# Patient Record
Sex: Male | Born: 1975
Health system: Southern US, Community
[De-identification: ages and names within clinical notes are randomized; demographics above are authoritative.]

## PROBLEM LIST (undated history)

## (undated) DIAGNOSIS — L732 Hidradenitis suppurativa: Secondary | ICD-10-CM

## (undated) DIAGNOSIS — T7840XA Allergy, unspecified, initial encounter: Secondary | ICD-10-CM

## (undated) DIAGNOSIS — Z87442 Personal history of urinary calculi: Secondary | ICD-10-CM

## (undated) DIAGNOSIS — L0291 Cutaneous abscess, unspecified: Secondary | ICD-10-CM

## (undated) DIAGNOSIS — N200 Calculus of kidney: Secondary | ICD-10-CM

## (undated) DIAGNOSIS — E119 Type 2 diabetes mellitus without complications: Secondary | ICD-10-CM

## (undated) DIAGNOSIS — F32A Depression, unspecified: Secondary | ICD-10-CM

## (undated) DIAGNOSIS — R42 Dizziness and giddiness: Secondary | ICD-10-CM

## (undated) DIAGNOSIS — K219 Gastro-esophageal reflux disease without esophagitis: Secondary | ICD-10-CM

## (undated) DIAGNOSIS — E785 Hyperlipidemia, unspecified: Secondary | ICD-10-CM

## (undated) DIAGNOSIS — I10 Essential (primary) hypertension: Secondary | ICD-10-CM

## (undated) DIAGNOSIS — F419 Anxiety disorder, unspecified: Secondary | ICD-10-CM

## (undated) HISTORY — DX: Depression, unspecified: F32.A

## (undated) HISTORY — DX: Calculus of kidney: N20.0

## (undated) HISTORY — DX: Type 2 diabetes mellitus without complications: E11.9

## (undated) HISTORY — PX: INCISION AND DRAINAGE DEEP NECK ABSCESS: SHX1797

## (undated) HISTORY — DX: Gastro-esophageal reflux disease without esophagitis: K21.9

## (undated) HISTORY — DX: Hyperlipidemia, unspecified: E78.5

## (undated) HISTORY — DX: Allergy, unspecified, initial encounter: T78.40XA

---
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
## 1998-06-14 ENCOUNTER — Encounter: Payer: Self-pay | Admitting: Emergency Medicine

## 1998-06-14 ENCOUNTER — Emergency Department (HOSPITAL_COMMUNITY): Admission: EM | Admit: 1998-06-14 | Discharge: 1998-06-14 | Payer: Self-pay | Admitting: Emergency Medicine

## 1999-11-18 ENCOUNTER — Emergency Department (HOSPITAL_COMMUNITY): Admission: EM | Admit: 1999-11-18 | Discharge: 1999-11-18 | Payer: Self-pay | Admitting: Emergency Medicine

## 2000-10-05 ENCOUNTER — Emergency Department (HOSPITAL_COMMUNITY): Admission: EM | Admit: 2000-10-05 | Discharge: 2000-10-05 | Payer: Self-pay | Admitting: Emergency Medicine

## 2000-10-06 ENCOUNTER — Encounter: Payer: Self-pay | Admitting: Otolaryngology

## 2000-10-06 ENCOUNTER — Inpatient Hospital Stay (HOSPITAL_COMMUNITY): Admission: RE | Admit: 2000-10-06 | Discharge: 2000-10-09 | Payer: Self-pay | Admitting: Otolaryngology

## 2003-07-09 ENCOUNTER — Emergency Department (HOSPITAL_COMMUNITY): Admission: EM | Admit: 2003-07-09 | Discharge: 2003-07-09 | Payer: Self-pay | Admitting: Emergency Medicine

## 2004-12-17 ENCOUNTER — Inpatient Hospital Stay (HOSPITAL_COMMUNITY): Admission: EM | Admit: 2004-12-17 | Discharge: 2004-12-19 | Payer: Self-pay | Admitting: Emergency Medicine

## 2005-01-09 ENCOUNTER — Emergency Department (HOSPITAL_COMMUNITY): Admission: EM | Admit: 2005-01-09 | Discharge: 2005-01-09 | Payer: Self-pay | Admitting: Emergency Medicine

## 2005-10-23 ENCOUNTER — Emergency Department (HOSPITAL_COMMUNITY): Admission: EM | Admit: 2005-10-23 | Discharge: 2005-10-23 | Payer: Self-pay | Admitting: Emergency Medicine

## 2005-12-30 ENCOUNTER — Emergency Department (HOSPITAL_COMMUNITY): Admission: EM | Admit: 2005-12-30 | Discharge: 2005-12-30 | Payer: Self-pay | Admitting: Emergency Medicine

## 2006-01-05 ENCOUNTER — Emergency Department (HOSPITAL_COMMUNITY): Admission: EM | Admit: 2006-01-05 | Discharge: 2006-01-05 | Payer: Self-pay | Admitting: Emergency Medicine

## 2006-01-28 ENCOUNTER — Inpatient Hospital Stay (HOSPITAL_COMMUNITY): Admission: EM | Admit: 2006-01-28 | Discharge: 2006-01-29 | Payer: Self-pay | Admitting: Emergency Medicine

## 2006-02-02 ENCOUNTER — Ambulatory Visit: Payer: Self-pay | Admitting: *Deleted

## 2006-03-04 ENCOUNTER — Emergency Department (HOSPITAL_COMMUNITY): Admission: EM | Admit: 2006-03-04 | Discharge: 2006-03-04 | Payer: Self-pay | Admitting: Emergency Medicine

## 2006-12-24 ENCOUNTER — Emergency Department (HOSPITAL_COMMUNITY): Admission: EM | Admit: 2006-12-24 | Discharge: 2006-12-24 | Payer: Self-pay | Admitting: Emergency Medicine

## 2007-09-28 ENCOUNTER — Encounter (INDEPENDENT_AMBULATORY_CARE_PROVIDER_SITE_OTHER): Payer: Self-pay | Admitting: *Deleted

## 2007-09-28 ENCOUNTER — Inpatient Hospital Stay (HOSPITAL_COMMUNITY): Admission: EM | Admit: 2007-09-28 | Discharge: 2007-09-30 | Payer: Self-pay | Admitting: Emergency Medicine

## 2007-09-28 ENCOUNTER — Ambulatory Visit: Payer: Self-pay | Admitting: Cardiology

## 2008-05-04 ENCOUNTER — Emergency Department (HOSPITAL_COMMUNITY): Admission: EM | Admit: 2008-05-04 | Discharge: 2008-05-04 | Payer: Self-pay | Admitting: Emergency Medicine

## 2008-11-16 ENCOUNTER — Emergency Department (HOSPITAL_COMMUNITY): Admission: EM | Admit: 2008-11-16 | Discharge: 2008-11-16 | Payer: Self-pay | Admitting: Emergency Medicine

## 2009-05-24 ENCOUNTER — Emergency Department (HOSPITAL_COMMUNITY): Admission: EM | Admit: 2009-05-24 | Discharge: 2009-05-24 | Payer: Self-pay | Admitting: Emergency Medicine

## 2009-09-28 ENCOUNTER — Emergency Department (HOSPITAL_COMMUNITY): Admission: EM | Admit: 2009-09-28 | Discharge: 2009-09-28 | Payer: Self-pay | Admitting: Emergency Medicine

## 2009-10-23 ENCOUNTER — Emergency Department (HOSPITAL_COMMUNITY): Admission: EM | Admit: 2009-10-23 | Discharge: 2009-10-23 | Payer: Self-pay | Admitting: Emergency Medicine

## 2010-07-12 ENCOUNTER — Emergency Department (HOSPITAL_COMMUNITY)
Admission: EM | Admit: 2010-07-12 | Discharge: 2010-07-12 | Disposition: A | Discharge status: Home / Self Care | Payer: Self-pay | Attending: Emergency Medicine | Admitting: Emergency Medicine

## 2010-07-12 DIAGNOSIS — M542 Cervicalgia: Secondary | ICD-10-CM | POA: Insufficient documentation

## 2010-07-12 DIAGNOSIS — L03221 Cellulitis of neck: Secondary | ICD-10-CM | POA: Insufficient documentation

## 2010-07-12 DIAGNOSIS — R220 Localized swelling, mass and lump, head: Secondary | ICD-10-CM | POA: Insufficient documentation

## 2010-07-12 DIAGNOSIS — L0211 Cutaneous abscess of neck: Secondary | ICD-10-CM | POA: Insufficient documentation

## 2010-07-13 ENCOUNTER — Inpatient Hospital Stay (HOSPITAL_COMMUNITY)
Admission: EM | Admit: 2010-07-13 | Discharge: 2010-07-16 | DRG: 607 | Disposition: A | Discharge status: Home / Self Care | Payer: Self-pay | Attending: Surgery | Admitting: Surgery

## 2010-07-13 DIAGNOSIS — Z6841 Body Mass Index (BMI) 40.0 and over, adult: Secondary | ICD-10-CM

## 2010-07-13 DIAGNOSIS — I10 Essential (primary) hypertension: Secondary | ICD-10-CM | POA: Diagnosis present

## 2010-07-13 DIAGNOSIS — F17200 Nicotine dependence, unspecified, uncomplicated: Secondary | ICD-10-CM | POA: Diagnosis present

## 2010-07-13 DIAGNOSIS — F1210 Cannabis abuse, uncomplicated: Secondary | ICD-10-CM | POA: Diagnosis present

## 2010-07-13 DIAGNOSIS — L732 Hidradenitis suppurativa: Principal | ICD-10-CM | POA: Diagnosis present

## 2010-07-13 DIAGNOSIS — E785 Hyperlipidemia, unspecified: Secondary | ICD-10-CM | POA: Diagnosis present

## 2010-07-13 DIAGNOSIS — I441 Atrioventricular block, second degree: Secondary | ICD-10-CM | POA: Diagnosis present

## 2010-07-13 DIAGNOSIS — F1411 Cocaine abuse, in remission: Secondary | ICD-10-CM | POA: Diagnosis present

## 2010-07-13 LAB — POCT I-STAT, CHEM 8
Calcium, Ion: 1.11 mmol/L — ABNORMAL LOW (ref 1.12–1.32)
HCT: 44 % (ref 39.0–52.0)
Hemoglobin: 15 g/dL (ref 13.0–17.0)
TCO2: 23 mmol/L (ref 0–100)

## 2010-07-13 LAB — URINALYSIS, ROUTINE W REFLEX MICROSCOPIC
Ketones, ur: NEGATIVE mg/dL
Protein, ur: NEGATIVE mg/dL
Urine Glucose, Fasting: NEGATIVE mg/dL
Urobilinogen, UA: 0.2 mg/dL (ref 0.0–1.0)

## 2010-07-13 LAB — CBC
MCH: 29.8 pg (ref 26.0–34.0)
MCHC: 34.6 g/dL (ref 30.0–36.0)
MCV: 86.2 fL (ref 78.0–100.0)
Platelets: 188 10*3/uL (ref 150–400)
RDW: 12.6 % (ref 11.5–15.5)

## 2010-07-13 LAB — DIFFERENTIAL
Basophils Relative: 0 % (ref 0–1)
Eosinophils Absolute: 0.3 10*3/uL (ref 0.0–0.7)
Eosinophils Relative: 4 % (ref 0–5)
Lymphs Abs: 2 10*3/uL (ref 0.7–4.0)
Monocytes Relative: 9 % (ref 3–12)

## 2010-07-14 LAB — CBC
HCT: 40.4 % (ref 39.0–52.0)
MCV: 86.9 fL (ref 78.0–100.0)
RBC: 4.65 MIL/uL (ref 4.22–5.81)
WBC: 6.1 10*3/uL (ref 4.0–10.5)

## 2010-07-14 LAB — COMPREHENSIVE METABOLIC PANEL
ALT: 20 U/L (ref 0–53)
BUN: 7 mg/dL (ref 6–23)
Calcium: 8.7 mg/dL (ref 8.4–10.5)
Glucose, Bld: 111 mg/dL — ABNORMAL HIGH (ref 70–99)
Sodium: 139 mEq/L (ref 135–145)
Total Protein: 5.9 g/dL — ABNORMAL LOW (ref 6.0–8.3)

## 2010-07-19 LAB — WOUND CULTURE: Gram Stain: NONE SEEN

## 2010-07-19 NOTE — Discharge Summary (Signed)
  Albert White, Albert White              ACCOUNT NO.:  [id number]  MEDICAL RECORD NO.:  [id number]           PATIENT TYPE:  I  LOCATION:  5126                         FACILITY:  [facility]  PHYSICIAN:  Angelia Mould. Derrell Lolling, M.D.DATE OF BIRTH:  1975-12-12  DATE OF ADMISSION:  07/13/2010 DATE OF DISCHARGE:  07/16/2010                              DISCHARGE SUMMARY   HISTORY OF PRESENT ILLNESS:  [name] is a 35 year old obese gentleman who has a history of abscesses on the back of his neck.  He has been taking doxycycline and Keflex, but presented to the emergency department with complaint of increased drainage and pain.  He has had maybe upwards of 20 incision and drainages of these neck abscesses. Decision was made after being evaluated by Dr. Magnus Ivan that the patient needed to be admitted for IV antibiotics and possible repeat incision and drainage.  SUMMARY OF HOSPITAL COURSE:  The patient was admitted on February 25, started on IV antibiotics.  However, he did show dramatic improvement in the first 24 hours on antibiotics with marked decrease in the amount of pain, redness, and drainage.  Therefore, the decision was made that operative intervention was not necessary.  The patient was switched to p.o. antibiotics where he maintained that for an additional 24 hours. Upon evaluation today, the patient's drainage has ceased, the erythema and tenderness have essentially gone, and the patient feels ready for discharge.  DISCHARGE DIAGNOSES: 1. Chronic posterior neck hidradenitis. 2. Morbid obesity.  DISCHARGE MEDS:  The patient will resume his doxycycline 100 mg twice daily.  Instead of Keflex, he will be placed on Cipro 500 mg twice daily for 10 days.  He is given a refill of his Percocet 1 to 2 tablets q.6 h. p.r.n. pain.  He is given hygiene instructions regarding showering on a daily basis using antimicrobial soap at least 3 times a week, keeping this area dry for moisture,  particularly sweat.  He can follow up back in our office in a couple of weeks for ongoing management.  The patient ultimately may need referral to Plastics if excision is thought necessary.     Brayton El, PA-C   ______________________________ Angelia Mould. Derrell Lolling, M.D.    KB/MEDQ  D:  07/16/2010  T:  07/16/2010  Job:  [id number]  Electronically Signed by Brayton El  on 07/17/2010 02:23:11 PM Electronically Signed by Claud Kelp M.D. on 07/19/2010 09:34:54 AM
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
## 2010-08-03 LAB — URINE MICROSCOPIC-ADD ON

## 2010-08-03 LAB — URINALYSIS, ROUTINE W REFLEX MICROSCOPIC
Glucose, UA: 250 mg/dL — AB
Specific Gravity, Urine: 1.027 (ref 1.005–1.030)
pH: 7 (ref 5.0–8.0)

## 2010-08-03 LAB — GC/CHLAMYDIA PROBE AMP, GENITAL: GC Probe Amp, Genital: NEGATIVE

## 2010-10-01 NOTE — Consult Note (Signed)
Albert White, Albert White              ACCOUNT NO.:  [id number]   MEDICAL RECORD NO.:  [id number]          PATIENT TYPE:  INP   LOCATION:  2010                         FACILITY:  [facility]   PHYSICIAN:  Maisie Fus C. Wall, MD, FACCDATE OF BIRTH:  1975-07-09   DATE OF CONSULTATION:  09/28/2007  DATE OF DISCHARGE:                                 CONSULTATION   PRIMARY CARE PHYSICIAN:  The patient does not have primary care  physician.  He has moved to __________ .   HISTORY OF PRESENT ILLNESS:  Albert White is a 35 year old white male who  presented to [facility] yesterday evening with chest  discomfort.  He stated that around 6 p.m. while growing hamburgers, he  gradually developed anterior left-sided discomfort, which he described  as a pressure, sharp, pounding discomfort that was lasting second at a  time, but it was reoccurring continuously for 3 hours.  He gave it a 10  on a scale of 0-10.  However, after 15 minutes of this discomfort, he  had his friend drive him to the [facility] Emergency Room.  He noted  associated shortness of breath with diaphoresis with this discomfort.  He states that the discomfort was reduced.  He was receiving sublingual  nitroglycerin in the ER.  He states that it is pleuritic in nature and  he feels safe if the chest is tender to touch.  He states that he still  has the discomfort, but it has had a less of an intensity.  He states it  is a 5 or a 6, the last time it was 0 was before the onset.  He denies  prior gas, waterbrash, previous occurrence, or any recent accidents,  injuries, or travel.   PAST MEDICAL HISTORY:  He has an intolerance to CODEINE, for which he  develops nausea.  He is not on any prescription medications.   PAST SURGICAL HISTORY:  Notable for a removal of a neck abscess.  He  specifically denies any hypertension, diabetes, myocardial infarction,  CVA, COPD, thyroid dysfunction, renal disorder, or bleeding disorder.  He did  not know his cholesterol.   SOCIAL HISTORY:  He resides in [place] alone in his trailer.  He has  no children.  He works in [employer].  He smokes one pack per day for  14 years.  Drinks 6 pack a month.  The last time he used cocaine was  approximately 2 weeks ago, but he uses pot on a daily basis.  He does  not exercise, does not follow a specific diet.  His mother died in her  65s secondary to a gunshot wound and had a history of arthritis.  His  father died at the age of 71 in a MVA.  He has one brother and 2  sisters.  Two of his siblings have thyroid problems.  He is not sure of  the specifics.   REVIEW OF SYSTEMS:  In addition to the above is notable for headaches,  sinus problems, chronic dyspnea on exertion, which has not changed  recently, problems with snoring that has never been  evaluated, and sleep  apnea.  Urinary urgency and frequency, increased stress related to  finances, GERD symptoms that are different from the above.  All other  systems are unremarkable.   PHYSICAL EXAMINATION:  GENERAL:  Well-nourished, well-developed, obese  white male in no apparent distress.  VITAL SIGNS:  Temperature is 96.8, blood pressure 142/79, pulse 68, and  respirations 20.  The patient's weight is 152.2 kilograms and 95% sat on  2 liters.  Telemetry shows a normal sinus rhythm.  At approximately 5:47  this morning, he did have second-degree AV block Wenckebach that was  asymptomatic.  HEENT:  Unremarkable.  NECK:  Supple without thyromegaly, adenopathy, JVD, or carotid bruits.  CHEST:  Symmetrical with excursion.  Lung sounds were diminished, but  clear to auscultation without rales, rhonchi, or wheezing.  HEART:  PMI is nondisplaced.  Regular rate and rhythm.  Normal S1 and  S2.  I do not appreciate any murmurs, rubs, clicks, or gallops.  Pulses  are symmetrical and intact.  I do not appreciate any abdominal bruits.  SKIN:  Integument is intact.  He has multiple tattoos in his  upper  extremities.  ABDOMEN:  Obese.  Bowel sounds present without organomegaly, masses, or  tenderness.  EXTREMITIES:  No cyanosis, clubbing, or edema.  MUSCULOSKELETAL:  Unremarkable, though with palpation, his chest  discomfort is intensified and while using his upper extremities in a  pushing motion, it also reproduces this discomfort.  NEUROLOGIC:  Unremarkable.   DIAGNOSTIC DATA:  Chest x-ray showed mild cardiomegaly, vascular  congestion, question right medial basilar air space opacity.  Several  EKGs have showed normal sinus rhythm, baseline artefact, normal  intervals, borderline first-degree block on some, insignificant inferior  Q waves, otherwise unremarkable.   Admission H&H was 16.7 and 46.8.  Normal indices.  Platelets 205, WBC  10.1.  Sodium 135, potassium 4.0, BUN 9, creatinine 0.98, glucose 126, D-  dimer 0.24, BNP less than 30.  CK-MBs, relative index and troponins have  been within normal limits.  Urine drug screen has been positive for  benzos, opioids, THC, and cocaine.   IMPRESSION:  1. Prolonged atypical chest discomfort.  Enzymes and EKGs have ruled      out acute cardiac event.  The last time his discomfort was a 0 was      yesterday.  2. Second-degree asymptomatic atrioventricular block (Wenckebach).  3. Hypertension.  On admission, his blood pressure was 156/98.  4. Hyperlipidemia with a total cholesterol of 170, triglycerides 97,      HDL low at 25, LDL elevated at 126.  5. Tobacco and drug use history noted as above.   DISPOSITION:  Dr. Daleen Squibb reviewed the patient history, spoke with and  examined the patient and agrees with the above.  An echocardiogram is  being performed at the time of this dictation, we will review.  Given  the atypical nature of his chest discomfort and multiple risk factors,  we will have a stress Myoview performed tomorrow morning for further  evaluation.  Given frequent cocaine use and second-degree arteriovenous  block,  would avoid beta-blockers.  I have counseled the patient in  regards to his drug use and tobacco cessation and obtaining a primary  care physician.      Joellyn Rued, PA-C      Jesse Sans. Daleen Squibb, MD, [facility]  Electronically Signed    EW/MEDQ  D:  09/28/2007  T:  09/29/2007  Job:  [id number]

## 2010-10-01 NOTE — H&P (Signed)
NAMECHURCHILL, Albert White NO.:  [id number]   MEDICAL RECORD NO.:  [id number]          PATIENT TYPE:  EMS   LOCATION:  [facility]                         FACILITY:  [facility]   PHYSICIAN:  Michaelyn Barter, M.D. DATE OF BIRTH:  11-23-1975   DATE OF ADMISSION:  09/27/2007  DATE OF DISCHARGE:                              HISTORY & PHYSICAL   PRIMARY MEDICAL DOCTOR:  Unassigned.   CHIEF COMPLAINTS:  Chest pain.   HISTORY OF PRESENT ILLNESS:  Albert White is a 35 year old gentleman who  indicates that at approximately 6 p.m., shortly after grilling He began  to develop chest pain.  He states that the chest pain was centrally  located with radiation to the left side of his chest.  It felt like  someone was sitting on his chest.  The pain lasted for a couple of  hours.  He developed some mild shortness of breath.  He has never had  similar pain.  No nausea, vomiting or fevers.  There is no radiation of  pain down his neck or arm.  He did become slightly diaphoretic.  No  aggravating or relieving factors.   PAST MEDICAL HISTORY:  1. Boil on the neck.  2. His abdominal wall cellulitis.   PAST SURGICAL HISTORY:  Abscess removed from the neck.   ALLERGIES:  The patient states that he does not have any drug allergies.  E-chart indicates that CODEINE is an allergy.   HOME MEDICATIONS:  None.   SOCIAL HISTORY:  Cigarettes:  The patient smokes one pack of cigarettes  per day.  He has been doing so since the age of 59.  Alcohol:  Occasionally. Cocaine:  The patient admits to using powder cocaine  occasionally, stating that the last time he used it was 1 month ago.   FAMILY HISTORY:  Mother has arthritis.  Father died secondary to MVA at  young age.   REVIEW OF SYSTEMS:  As per HPI.   PHYSICAL EXAMINATION:  GENERAL:  The patient is awake.  He is  cooperative, in no obvious distress.  VITAL SIGNS:  Temperature 97.9, blood pressure 156/98, heart rate 90,  respirations 32, oxygen  saturation 98%.  HEENT:  Normocephalic, atraumatic.  Anicteric.  Extraocular muscles are  intact.  Oral mucosa is pink.  No thrush, no exudates.  NECK:  Supple.  No JVD, no lymphadenopathy, no thyromegaly.  CARDIAC:  S1 and S2 present.  Regular rate and rhythm.  RESPIRATORY:  No crackles or wheezes.  ABDOMEN:  Soft, nontender, nondistended.  Positive bowel sounds.  No  masses palpated.  EXTREMITIES:  No leg edema.  NEUROLOGIC:  The patient is alert and oriented x3.  MUSCULOSKELETAL:  5/5 upper and lower extremity strength.   LABORATORY FINDINGS:  CK-MB, POC less than 1.  Troponin I, POC less than  0.5.  BNP less than 30.  Sodium 138, potassium 3.7, chloride 108,  glucose 95, BUN 13, creatinine 1.  D-dimer 0.24.  White blood cell count  10.1, hemoglobin 15.9, hematocrit 46.8, platelets 205.  EKG reveals  normal sinus rhythm.  No Q-waves or ST-segment abnormalities.  Chest x-  ray reveals mild cardiomegaly and vascular congestion, questionable  right medial basilar air space opacity.   ASSESSMENT:  1. Chest pain.  The etiology of this is cardiac versus noncardiac.      Will cycle the patient's cardiac enzymes including troponin I plus      CK-MB x3 q.8 h apart to rule out an acute coronary syndrome.  Will      provide p.r.n. morphine, oxygen, nitroglycerin, and aspirin.  Will      check a fasting lipid profile as well as a 2-D echocardiogram .  2. Gastrointestinal prophylaxis.  Will provide Protonix.  3. Deep venous thrombosis prophylaxis.  Will provide Lovenox.      Michaelyn Barter, M.D.  Electronically Signed     OR/MEDQ  D:  09/28/2007  T:  09/28/2007  Job:  [id number]

## 2010-10-01 NOTE — Consult Note (Signed)
[name], Albert White              ACCOUNT NO.:  [id number]   MEDICAL RECORD NO.:  [id number]          PATIENT TYPE:  INP   LOCATION:  2010                         FACILITY:  [facility]   PHYSICIAN:  Maisie Fus C. Wall, MD, FACCDATE OF BIRTH:  [birth year]   DATE OF CONSULTATION:  09/28/2007  DATE OF DISCHARGE:                                 CONSULTATION   REASON FOR CONSULTATION:  Chest discomfort.   HISTORY OF PRESENT ILLNESS:  Mr. Hippert is a 35 year old single white  male who developed a gradual onset of left-sided chest pain followed by  a sharp, stabbing pain in the middle of the chest.  It went on for about  3 hours, 10/10.  A friend drove him to the emergency room.  He has some  shortness of breath.   It decreased after one sublingual nitroglycerin.  It is still 5/6.  He  is still having some low grade pain even though he was asleep when I  walked in the room.   He has multiple cardiac risk factors even at his young age including  obesity, hypertension, polysubstance abuse with tobacco use and cocaine  use.  We are not sure of his lipid panel.   ALLERGIES:  He is allergic to CODEINE, which causes nausea and vomiting.   MEDICATIONS:  He was on no medications on admission.   SOCIAL HISTORY:  He lives in [place] alone in a trailer.  He is  single.  He smokes one pack of cigarettes per day.  He works in  [employer].  He smokes pot every day.  He does cocaine, last used  about 2 weeks ago.  He drinks a six-pack of beer per month at least.  He  does not exercise.  He is overweight.   FAMILY HISTORY:  Negative for any premature coronary disease.   REVIEW OF SYSTEMS:  He relates a history of snoring and not sleeping  well at times.  His other review of systems other than HPI is negative.   PHYSICAL EXAMINATION:  When I walked in the room, he was lying on his  side, snoring loudly, but no sign of apnea.  VITAL SIGNS:  His blood pressure was 142/79, his pulse 68, in a sinus  rhythm.  His respiratory rate is 20.  His temperature is 96.8.  Telemetry showed normal sinus rhythm with second-degree Wenckebach A-V  block.  He is asymptomatic with that.  GENERAL APPEARANCE:  He is disheveled.  No acute distress.  SKIN:  Warm and dry.  HEENT:  Face is somewhat plethoric.  Normocephalic and atraumatic.  PERRLA.  Extraocular movements intact.  Sclerae are slightly injected.  Facial symmetry is normal.  Dentition is in poor repair.  NECK:  Supple.  Negative bruits.  No JVD.  HEART:  Reveals a regular rate and rhythm, poorly appreciated PMI.  No  rub, no murmur.  LUNGS:  Clear to auscultation and percussion.  ABDOMEN:  Soft.  Good bowel sounds.  He is obese.  Organomegaly was  difficult to assess.  EXTREMITIES:  No cyanosis, clubbing, or edema.  No sign of DVT.  Pulses  are present.  MUSCULOSKELETAL:  Positive tenderness in the chest.  NEUROLOGIC:  Intact.   X-RAYS:  Chest x-ray showed mild cardiomegaly with vascular congestion.  EKG shows sinus rhythm, essentially normal.  Telemetry strip shows  second-degree Wenckebach A-V block.   Labs are unremarkable.  D-dimer was negative.  Cardiac enzymes x2 were  negative.  His total cholesterol was 170, his HDL is 25, and his LDL is  126.  Urine drug screen was positive for opiates, THC, benzos, and  cocaine.   ASSESSMENT:  1. Noncardiac chest pain, which is presumably musculoskeletal.  2. Type 2 second-degree A-V block, which is benign.  3. Hypertension.  4. Mixed hyperlipidemia.  5. Tobacco use.  6. Polysubstance abuse including cocaine.  7. Question obstructive sleep apnea.  8. Obesity.   RECOMMENDATIONS:  1. Therapeutic lifestyle choices, which would be positive for his      future.  2. No further cardiac workup.   Thank you very much for the consultation.      Thomas C. Daleen Squibb, MD, [facility]  Electronically Signed     TCW/MEDQ  D:  09/28/2007  T:  09/29/2007  Job:  [id number]   cc:   Marcellus Scott, MD

## 2010-10-01 NOTE — Discharge Summary (Signed)
NAMEKERMITT, HARJO              ACCOUNT NO.:  [id number]   MEDICAL RECORD NO.:  [id number]          PATIENT TYPE:  INP   LOCATION:  2010                         FACILITY:  [facility]   PHYSICIAN:  Hind I Elsaid, MD      DATE OF BIRTH:  1975-07-29   DATE OF ADMISSION:  09/27/2007  DATE OF DISCHARGE:  09/30/2007                               DISCHARGE SUMMARY   PRIMARY CARE PHYSICIAN:  Trudi Ida. Denton Lank, MD   DISCHARGE DIAGNOSES:  1. Atypical chest pain.  2. Second-degree atrioventricular block, Wenckebach.  3. Episode of hypertension, which resolved at this time.  4. Hyperlipidemia.  5. Tobacco abuse.  6. Polysubstance abuse.  7. Obesity.   DISCHARGE MEDICATIONS:  1. Protonix 40 mg daily.  2. Diazepam 5 mg p.o. at bedtime p.r.n. for insomnia.   CONSULTATION:  Cardiology consult was done by Dr. Valera Castle.   PROCEDURE:  1. Chest x-ray with cardiomegaly and vascular congestion, questionable      right middle basilar air space.  2. Chest x-ray, no active disease.  3. Myocardial perfusion study, negative for exercise stress-induced      ischemia, __________  ejection fraction of 55%.  4. A 2-D echo, the left ventricular systolic function was normal,      ejection fraction still 55%, inadequate to evaluate for left      ventricular regional wall motion abnormality, and left atrium was      mildly dilated.   HISTORY OF PRESENT ILLNESS/HOSPITAL COURSE:  1. This is a 35 year old white male presented to [facility] with      discomfort.  He noted also slight shortness of breath with      diaphoresis with chest discomfort.  The patient admitted for      evaluation of chest pain which has seemed atypical.  EKG and      cardiac enzymes were all negative.  Secondary to __________  pain,      Cardiology was consulted, and as the patient has second-degree AV      block Wenckebach, Cardiology preferred to do Myoview with the      result as above, which was negative.  During the  hospitalization,      also D-dimer was done, which was nonsignificant.  His chest pain is      atypical in nature.  Also during the hospitalization, the patient      has high blood pressure.  The patient was kept under monitor, blood      pressure remained under reasonable control.  At this time, I do not      feel the patient needs any blood pressure medications.  2. Hyperlipidemia.  Exercise and diet was offered to the patient.  3. Smoking and polysubstance abuse.  Counseling was done during the      hospitalization, and social worker met with the patient, and      information for substance abuse was given to the patient.  It was      felt that the patient was medically stable, to be discharged home,      and follow up  with his primary care as an outpatient.      Hind Bosie Helper, MD  Electronically Signed     HIE/MEDQ  D:  09/30/2007  T:  10/01/2007  Job:  [id number]

## 2010-10-04 NOTE — Consult Note (Signed)
Albert White, Albert White NO.:  [id number]   MEDICAL RECORD NO.:  [id number]          PATIENT TYPE:  INP   LOCATION:  6715                         FACILITY:  [facility]   PHYSICIAN:  [name], M.D.   DATE OF BIRTH:  1975-11-17   DATE OF CONSULTATION:  01/28/2006  DATE OF DISCHARGE:                                   CONSULTATION   PRIMARY CARE PHYSICIAN:  None.   REASON FOR CONSULTATION:  Abscess of the nape of the neck.   HISTORY OF PRESENT ILLNESS:  Albert White is a 35 year old obese male patient  with a history of recurrent boils to the neck for several years.  He has  been to the OR for this x1, according to the patient.  He now has an acute  onset over 3 days of the new area in the left posterior nape of the neck.  This areas is exquisitely tender.  He was admitted today by internal  medicine and started empirically on Zosyn.  A surgical evaluation has been  requested.   REVIEW OF SYSTEMS:  As above.  He has had fever.  No chills.  No nausea.  He  has had exquisite pain.   PAST MEDICAL HISTORY:  1. Obesity.  2. Tobacco abuse.  3. Recurrent neck boils.   PAST SURGICAL HISTORY:  Prior intraoperative I&D of a neck abscess.   SOCIAL HISTORY:  Positive for tobacco, no alcohol.   FAMILY HISTORY:  Noncontributory.   ALLERGIES:  CODEINE.   CURRENT MEDICATIONS:  Please see medication reconciliation sheet for home  medications.  Since admission the patient has been started on Zosyn, today  vancomycin was added by internal medicine, nicotine patch, Dilaudid for  pain, oxycodone for pain, Tylenol, Phenergan.   PHYSICAL EXAMINATION:  GENERAL:  Pleasant male patient complaining of  significant pain at the nape of the neck.  VITAL SIGNS:  Temperature 98.5, BP 135/75, pulse 70 regular, respirations  16, weight 349 pounds.  NEURO:  The patient is alert and oriented x3, moving all extremities x4.  No  focal neurological deficits.  HEENT:  Head is normocephalic.  NECK:  Supple.  There is a large complex cystic-appearing abscess at the  left nape of the neck.  There is no drainage.  The area is exquisitely  tender without erythema but it is indurated.  CHEST:  Bilateral lung sounds clear to auscultation.  Respiratory effort is  not labored.  CARDIAC:  S1-S2.  No rubs, murmurs, thrills, no gallops.  ABDOMEN:  Obese, soft, nontender, nondistended.  EXTREMITIES:  Show trace to 1+ lower extremity edema bilaterally.   LABORATORY:  Sodium 138, potassium 4.3, CO2 22, BUN 8, creatinine 0.9,  glucose 100.  LFTs are normal.  White count 9,300, neutrophils 50%,  hemoglobin 14.7, platelets 116,000.   DIAGNOSTIC STUDIES:  None ordered this admission.   IMPRESSION:  Cystic complex abscess in the posterior neck.   PLAN:  1. Dr. Orson Slick has already seen the patient.  He has discussed      intraoperative I&D of this area and the patient agrees.  2. Agree with  Zosyn and vancomycin empirically until cultures return.  3. We will obtain blood cultures in the OR.  4. We will go ahead and add Toradol for pain.  5. Due to the patient's size and increased pain, we will increase the      Dilaudid dosing.      [name], N.P.      [name], M.D.  Electronically Signed    ALE/MEDQ  D:  01/28/2006  T:  01/28/2006  Job:  [id number]

## 2010-10-04 NOTE — Discharge Summary (Signed)
[place]. [facility]  Patient:    Albert White, Albert White                     MRN: [id number] Adm. Date:  [id number] Disc. Date: [id number] Attending:  Fernande Boyden                           Discharge Summary  CHIEF COMPLAINT:  Right posterior neck swelling.  HISTORY OF PRESENT ILLNESS:  A 35 year old white male with a five-day history of progressive tender right posterior neck swelling.  He has a history of prior incision and drainage of the right posterior neck abscess in 2001 and several other episodes that have been spontaneously draining.  He had no documented fever.  He does have a hidradenitis-type skin of the posterior neck.  No recent trauma to the area that he is aware of.  No diabetes or immune compromise.  No headache or vision problems.  No swallowing or breathing difficulty.  ALLERGIES:  He has no known medical allergies.  CURRENT MEDICATIONS:  Keflex and Vicodin.  PAST MEDICAL HISTORY:  No history of diabetes, HIV, or prednisone therapy.  No history of asthma, epilepsy, hypertension, or heart murmur.  He does smoke one pack of cigarettes per day.  No bleeding tendencies.  SOCIAL HISTORY:  He works.  He is not married.  He does smoke.  FAMILY HISTORY:  Negative for bleeding or anesthetic reactions.  PHYSICAL EXAMINATION:  This is an obese, slightly slow mentating, distressed, young adult, white male.  HEENT:  Both ears have impacted wax.  The anterior nose, mouth, and throat are clear.  NECK:  A large tender swelling of the right posterior neck beneath a prior scar.  It feels fluctuant.  There are some cellulitic changes of the skin with tenderness, but no crepitus beyond the actual area of fluctuance.  He has a buffalo hump over the upper thoracic spine.  LUNGS:  Clear and unlabored.  HEART:  Regular rate and rhythm without murmur.  ABDOMEN:  Soft, active, and obese.  EXTREMITIES:  Tattoos.  SKIN:  Acne-type skin of the  face, neck, and upper back.  ADMISSION DIAGNOSIS:  Right neck carbuncle.  HOSPITAL COURSE:  The patient was admitted and received a CT scan showing a large multiloculated cystic area in the posterior neck.   He was taken the very same day to the operating room where excision and drainage of a large abscess was performed.  The loculations were carefully removed.  The wound was thoroughly irrigated and the wound was packed with iodoform Nu Gauze.  Grams stain showed gram-positive pairs and gram-negative rods.  By the following day, he was having less pain and was afebrile.  He was continued on intravenous antibiosis.  Home health referrals were made in anticipation of prolonged dressing change needs.  By the second postoperative day, the dressings were slowly being removed.  On the third postoperative day with pain controlled and infection definitely clearing, especially the cellulitic component and with antibiotic coverage appropriate for cultures, the patient was discharged to his home.  He never did grow out anything positive from the cultures.  We will see him back in two days for additional dressing changes. Home health nurses will come out daily to help.  The patient and family understand and agree with the plans.  DISCHARGE DIAGNOSIS:  Right posterior neck carbuncle.  PROCEDURES PERFORMED:  Incision and drainage of right posterior  neck carbuncle on Oct 06, 2000.  COMPLICATIONS:  None.  CONDITION ON DISCHARGE:  Infection controlled.  Ambulatory.  Pain controlled.  FOLLOW-UP:  Return visit in four days.  DISCHARGE MEDICATIONS:  Prescriptions for Percocet, Vicodin, and Keflex. DD:  11/06/00 TD:  11/09/00 Job: [id number]

## 2010-10-04 NOTE — H&P (Signed)
[place]. [facility]  Patient:    Albert White, Albert White                     MRN: [id number] Adm. Date:  [id number] Disc. Date: [id number] Attending:  Osvaldo Human CC:         Dr. Lindie Spruce   History and Physical  CHIEF COMPLAINT: Painful right posterior neck swelling.  HISTORY OF PRESENT ILLNESS: The patient is a 35 year old white male with a five day history of progressive tender right posterior neck swelling.  The patient has a history of acne, history of prior incision and drainage of the right posterior neck 6-8 months ago, and history of having several abscesses of the posterior neck which had drained spontaneously or were treated with mcl therapy alone.  The patient thinks he may have had some fever.  No recent trauma, animal or insect bites or scratches.  He is currently on Keflex and Vicodin but progressively getting worse, including tenderness around the anterior neck, up onto the face, and down beyond the clavicle.  ALLERGIES: No known drug allergies.  PAST MEDICAL HISTORY: No history of diabetes, HIV, or prednisone therapy or any other immune compromise.  No history of asthma, epilepsy, hypertension, or heart murmur.  No history of bleeding disorder.  PAST SURGICAL HISTORY:  1. He had the prior described incision and drainage of the right posterior     neck abscess last year.  2. Some distant oral surgery.  FAMILY HISTORY: No family history of bleeding or anesthetic reactions.  SOCIAL HISTORY: He smokes one packs of cigarettes daily.  PHYSICAL EXAMINATION:  GENERAL: Examination at the time of admission reveals an obese, alert, young adult white male.  Mental status seems somewhat slow to questioning.  HEENT: He has moderate quantities of deep wax in both ear canals.  Nose, oral cavity, oropharynx all clear.  NECK: Remarkable for indurated, tender swelling of the posterior neck with some multiple acne form scarring, several atrophic areas  consistent with prior scars, and fluctuant central area as well as concerning induration.  The tenderness extends without visible surface area ______ or cellulitis onto the "buffalo hump" of his shoulders anterior toward the midline neck and down beyond the clavicle.  No crepitus.  CHEST: Voice is clear and respirations unlabored.  Chest is clear to auscultation.  HEART: Regular rate and rhythm without murmurs.  ABDOMEN: Obese, active, and soft.  EXTREMITIES: Multiple tattoos on arms and legs.  SKIN: Multiple acne scars, especially on the posterior neck bilaterally.  ADMISSION DIAGNOSIS: Right posterior neck carbuncle.  PLAN: Admit to hospital for intravenous antibiosis.  Talked with Dr. Burnice Logan about the appropriateness of using Kefzol, with which he agreed. Will get a CT scan with contrast to rule out deep neck infection, especially necrotizing fasciitis.  Will check a CBC and plan on performing incision and drainage of the lesion later today. DD:  10/06/00 TD:  10/07/00 Job: [id number]

## 2010-10-04 NOTE — Op Note (Signed)
NAMEOSHEA, Albert White NO.:  [id number]   MEDICAL RECORD NO.:  [id number]          PATIENT TYPE:  INP   LOCATION:  6715                         FACILITY:  [facility]   PHYSICIAN:  [name], M.D.   DATE OF BIRTH:  [birth year]   DATE OF PROCEDURE:  01/28/2006  DATE OF DISCHARGE:                                 OPERATIVE REPORT   PREOPERATIVE DIAGNOSIS:  Abscess on the posterior neck.   POSTOPERATIVE DIAGNOSIS:  Abscess on the posterior neck.   OPERATION:  I&D of abscess.   SURGEON:  [name], M.D.   ANESTHESIA:  General.   BLOOD LOSS:  Minimal.   COMPLICATIONS:  None.   SPECIMEN:  Culture.   PROCEDURE:  After the patient was monitored and asleep and had routine  preparation and draping of the posterior neck with him positioned tilted a  bit toward the right and the shoulder elevated, I had good view of the  abscess.  I probed a superficial part of the place where the abscess was  leaking pus a little bit.  I then opened that area and did not get  satisfactory drainage.  I thought there was probably a deeper abscess  because of the induration which is present.  I used a hemostat to probe and  got into a quite large deep abscess cavity.  I opened it fully and debrided  overhanging skin edges to provide good drainage.  I got hemostasis with the  cautery and packed the abscess cavity with gauze and applied a bulky  bandage.  He tolerated the operation well.      [name], M.D.  Electronically Signed     WB/MEDQ  D:  01/28/2006  T:  01/29/2006  Job:  [id number]

## 2010-10-04 NOTE — Op Note (Signed)
NAMEALLAN, MINOTTI NO.:  [id number]   MEDICAL RECORD NO.:  [id number]          PATIENT TYPE:  INP   LOCATION:  6715                         FACILITY:  [facility]   PHYSICIAN:  [name], M.D.   DATE OF BIRTH:  Nov 27, 1975   DATE OF PROCEDURE:  01/28/2006  DATE OF DISCHARGE:                                 OPERATIVE REPORT   PREOPERATIVE DIAGNOSIS:  Abscess of the posterior neck.   POSTOPERATIVE DIAGNOSIS:  Abscess of the posterior neck.   OPERATION:  Incision and drainage of abscess.   SURGEON:  [name], M.D.   DICTATION ENDED AT THIS POINT.Connection was lost-note dictated as another  job. WB      [name], M.D.  Electronically Signed     WB/MEDQ  D:  01/28/2006  T:  01/28/2006  Job:  [id number]

## 2010-10-04 NOTE — Op Note (Signed)
[place]. [facility]  Patient:    Albert White, Albert White                     MRN: [id number] Proc. Date: 10/06/00 Adm. Date:  [id number] Disc. Date: [id number] Attending:  Osvaldo Human CC:         Jimmye Norman, M.D.   Operative Report  PREOPERATIVE DIAGNOSIS:  Right posterior neck carbuncle.  POSTOPERATIVE DIAGNOSIS:  Right posterior neck carbuncle.  PROCEDURE:  Incision and drainage, exploration, and irrigation, right posterior neck carbuncle.  SURGEON:  Gloris Manchester. Lazarus Salines, M.D.  ANESTHESIA:  General orotracheal in the left lateral decubitus position.  ESTIMATED BLOOD LOSS:  Less than 25 cc.  COMPLICATIONS:  None.  FINDING:  Obese neck with thick skin and multiple acne scars and sebaceous-type skin quality with several scars consistent with prior abscesses which either drained spontaneously or on one occasion were drained surgically. A large fluctuant area in the midposterior right neck with surrounding induration but no real spreading erysipelas or cellulitis.  On exploration, approximately 40 cc of frank pus tracking from the subcutaneous plane down along the splenius capitis and trapezius muscle.  Multiple small dermal loculations lysed with instrument and fingertip exploration.  DESCRIPTION OF PROCEDURE:  With the patient in the comfortable supine position, general orotracheal anesthesia was induced without difficulty.  At an appropriate level, the table was turned approximately 90 degrees.  With multiple assistants, owing to the patients great bulk, the patient was turned up into a left lateral decubitus position and thoroughly supported to prevent any kind of intraoperative injury.  The posterior neck was examined with the findings as described above.  A sterile preparation and draping of the entire posterior and right neck was performed in the standard fashion.  An 18-gauge needle was inserted into the abscess on a 20-cc syringe,  and approximately 10 cc of frank pus was evacuated and sent for Gram stain, cultures for aerobes and anaerobes.  The needle was removed.  Working in an atrophic area of skin consistent with the abscess coming to the surface or possibly with the prior scar, a 4 cm incision was sharply executed using the cutting and coagulating cautery.  The abscess pocket was encountered, and additional pus was evacuated.  There was a moderate foul smell to this pus but not a full anaerobic odor.  The opening was enlarged to approximately 4 cm total opening into the abscess cavity.  Using the Yankauer suction tip as a blunt dissector and exploring all recesses of the cavity, and finally using my index finger in the glove, the cavity was thoroughly explored.  Some additional loculations in the subdermal fat were released.  Small amounts of necrotic tissue lining the cavity were removed and suctioned.  Hemostasis was spontaneous.  The wound was irrigated with approximately 1000 cc of 50% Betadine solution and 50% saline.  Again hemostasis was observed.  After assessing the wound once further and no additional loculations having been identified, the wound was thoroughly packed with two-inch iodoform Nu Gauze. Note that the patient had a CT scan earlier today, and this was used for exploration guidance, and no additional loculations were felt to be present. After packing the wound with iodoform, hemostasis was observed.  At this point, the procedure was completed.  A large Kerlix, fluff, and wrap dressing was applied in the standard fashion.  At this point, the patient was returned to anesthesia, returned to a supine position, awakened, extubated, and transferred  to recovery in stable condition.  COMMENT:  A 35 year old white male with a history of multiple posterior neck abscesses and now with a four to five day history of a very large abscess of the posterior neck failing to respond on oral Keflex was the  indication for todays procedure.  Anticipate a routine postoperative recovery with attention to Gram stain information, culture and sensitivities pending.  Will stay with Kefzol as an appropriate therapy for community-acquired staph infection.  Will place a large, absorbent dressing, which will be changed considering the Nu Gauze in one to two days.  Will keep the patient in the hospital until there is clear resolution of this infection. DD:  10/06/00 TD:  10/07/00 Job: [id number]

## 2010-10-04 NOTE — H&P (Signed)
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 According to Mr.  Novosad, for the last several days he has been experiencing some redness and  irritation of the skin underlying his stomach fat pad. In the last 24 hours  this area has been extremely painful. He presented to the emergency room  where he was noted to have an area of induration without obvious fluctuance  which appears to be an abdominal wall cellulitis. He is admitted at this  time for treatment of this problem. Of note is that the patient has had  recurrent problems with cellulitis and abscesses. He has been treated for an  abscess of the neck on three occasions, most recently last year. He also  more recently had an abscess on his inner thigh which drained spontaneously.  Also of note is that for the last week he has been extremely thirsty and has  been urinating about every hour to hour and half he has also been  experiencing nocturia. He states that during all of his previous  hospitalizations he has been evaluated for signs of diabetes and no evidence  of diabetes has been found.   PAST MEDICAL HISTORY:  Currently takes no medications.   ALLERGIES:  He is intolerant of CODEINE.   SURGERIES:  He states that he has had no operations except for incision and  drainage of abscesses.   He has had no other medical illnesses.   FAMILY HISTORY:  His father died in a motor vehicle accident; his mother  died as result of a gunshot wound. Apparently his mother had a history of  arthritis and  hypothyroidism. He also has a sister who has hypothyroidism.   SOCIAL HISTORY:  Smokes one pack of cigarettes per day. He will occasionally  drink alcohol. He denies abuse of drugs.   REVIEW OF SYSTEMS:  HEAD:  He denies headache or dizziness. EYES:  He denies  visual blurring or diplopia. EAR/NOSE/THROAT:  Denies earache, sinus pain or  sore throat. CHEST:  Denies coughing wheezing or chest congestion.  CARDIOVASCULAR:  Denies orthopnea, PND or ankle edema. GI:  He has recently  felt nauseated. He denies vomiting, hematemesis or melena. GU:  See above.  NEURO:  There is no history of seizure or stroke. ENDOCRINE:  See above.   PHYSICAL EXAMINATION:  Blood pressure is 136/74, pulse 82, respirations 24,  O2 saturations 96%.  HEENT:  Exam is within normal limits.  CHEST:  Clear.  BACK:  Examination the back reveals the skin has a few small pimples, there  are no obvious abscesses on his back.  CARDIOVASCULAR:  Reveals normal S1 and S2 without gross murmurs or gallops.  ABDOMEN:  The abdomen is obese. There are normal bowel sounds with no masses  or tenderness. No guarding or rebound on examination of the abdominal fat  fold, when his stomach is a retracted there is an area of induration which  is hidden by the adipose fold. This measures about 6 cm in diameter and is  exquisitely tender to palpation. There is no obvious fluctuance. There is no  gas formation.  NEUROLOGIC:  Testing examination is normal.  EXTREMITIES:  Normal.   IMPRESSION:  1.  Cellulitis of the abdominal wall.  2.  History of recurrent abscesses of the neck and groin.  3.  Polyuria, polydipsia and nocturia, rule out diabetes.  4.  Twenty pack year smoking history.   PLAN:  Will obtain blood cultures as well as a BME, A1C, and urinalysis.  CBGs will be monitored. The patient will be started on vancomycin and Zosyn,  pain medicine will be given. His course will be monitored closely.       SY/MEDQ  D:   12/17/2004  T:  12/18/2004  Job:  [id number]

## 2010-10-04 NOTE — H&P (Signed)
Albert White, Albert White NO.:  [id number]   MEDICAL RECORD NO.:  [id number]          PATIENT TYPE:  EMS   LOCATION:  [facility]                         FACILITY:  [facility]   PHYSICIAN:  Elliot Cousin, M.D.    DATE OF BIRTH:  1975-09-23   DATE OF ADMISSION:  01/27/2006  DATE OF DISCHARGE:                                HISTORY & PHYSICAL   PRIMARY CARE PHYSICIAN:  The patient is unassigned.   CHIEF COMPLAINT:  Painful boil on the neck.   HISTORY OF PRESENT ILLNESS:  The patient is a 35 year old man with a past  medical history significant for recurrent neck boils who presents to the  emergency department with a three-day history of a recurrent boil on the  left side of his posterior neck.  The patient was recently seen in the  emergency department at [facility] on January 05, 2006.  At that  time he apparently had a local I&D of the boil.  He was sent home on 10 days  of Keflex therapy.  He was advised to follow up with the local surgeon in  [place], Dr. Malvin Johns.  However, he did not.  Over the past 24-48 hours,  he had developed stiffness and exquisite pain at the boil site.  He has had  subjective chills but no subjective fever.  He has also had associated left  arm numbness and tingling intermittently.  He says that the numbness and  tingling occur generally when the boil returns.  The patient recently had  his hair cut by his sister with clippers two days ago.  Per his account, the  clippers were sanitized.   REVIEW OF SYSTEMS:  The patient's review of systems is positive for an  increase in urination but no pain with urination.  Numbness and tingling of  the left arm intermittently as stated above.  Otherwise his review of  systems has been negative.   During the evaluation in the emergency department, the patient was found to  be hemodynamically stable and afebrile.  His white blood cell count is  within normal limits at 9.3.  However, given the severity  of his symptoms,  he will be admitted for further evaluation and management.   PAST MEDICAL HISTORY:  1. Recurrent boils on his neck.  The patient numbers approximately 15      occurences over the past 10 years.  In May 2002, he was status post      incision and drainage, exploration, and irrigation of a right posterior      neck carbuncle.  In August 2006, he was admitted for cellulitis of the      abdominal wall with a focal boil.  He also has a history of having an      abscess incised and drained of his inner right thigh in the past.  2. He has been evaluated for diabetes mellitus in the past and apparently      the evaluation was negative for diabetes.  3. Tobacco abuse.   MEDICATIONS:  None.   ALLERGIES:  No known drug allergies.  SOCIAL HISTORY:  The patient is single.  He has no children.  He is employed  in [employer].  He smokes one and a half packs of cigarettes per day and  has been doing so for 15 years.  He drinks alcohol occasionally.  He smokes  marijuana occasionally.  He completed the seventh grade in school. He can  read and write.   FAMILY HISTORY:  Both of his parents are deceased.  His mother was murdered.  His father died in a motor vehicle accident.  He has two sisters and one  brother, all of which have thyroid problems.   PHYSICAL EXAMINATION:  VITAL SIGNS:  Temperature 97.1, blood pressure  135/94, pulse 78, respiratory rate 24, oxygen saturation 97% on room air.  GENERAL:  The patient is a pleasant, obese, 35 year old Caucasian man who is  currently sitting up in bed in mild distress from neck pain.  HEENT:  Scalp of the patient's posterior neck is erythematous with some  fluctuance over the superficial scalp area on the right in the location of  the occipital lobe.  There is also an approximate 3-4 cm furuncle/carbuncle  on the posterior left neck with mild surrounding erythema and exquisite  tenderness.  No active purulent drainage.  There are  palpable cervical lymph  nodes with tenderness bilaterally.  There is also some scarring over his  nape.  Otherwise his head is normocephalic.  Pupils are equal, round and  reactive to light.  Extraocular movements are intact.  Conjunctivae clear.  Sclerae white.  Tympanic membranes are clear bilaterally.  Nasal mucosa is  moist.  No sinus tenderness.  Oropharynx reveals a prominent uvula;  otherwise no posterior exudates or erythema.  Mucous membranes are mildly  dry.  NECK:  There is a decrease in range of motion with regards to flexion and  extension and rotation, particularly when he tries to turn his head to the  left.  There is exquisite tenderness over the trapezius muscles and the  paracervical muscles of his neck.  As indicated above, the erythema of his  scalp extends from the occipital region down to below his cervical spine  area.  No thyromegaly.  Palpable cervical lymph nodes.  LUNGS:  There is an occasional wheeze; otherwise the patient's lung exam is  clear.  Breath sounds are audible down to the base.  Breathing is  nonlabored.  HEART:  S1 and S2 with no murmurs, rubs or gallops.  ABDOMEN:  Positive bowel sounds.  Soft.  Obese.  Nontender.  Nondistended.  No hepatosplenomegaly. No masses palpated.  EXTREMITIES:  Pedal pulses are barely palpable.  No pretibial edema.  No  pedal edema.  The patient has fungal changes of his toenails bilaterally.  NEUROLOGIC:  The patient is alert and oriented x3.  Cranial nerves II-XII  intact.  Strength is 5/5 throughout.  Sensation is intact.  The patient has  good bilateral hand grip.  Sensation in particular over his left upper  extremity is intact.   ADMISSION LABORATORY DATA:  WBC 9.3, hemoglobin 14.7, platelets 216.   ASSESSMENT:  1. Painful boil/furuncle of the posterior left neck.  Although the patient      is afebrile and his white blood cell count is within normal limits, I     am concerned about an impending worsening  abscess or worse.  The      patient appears to be in exquisite pain and has a significant reduction      in  the range of motion of his neck.  A CT scan of the neck will be      ordered to evaluate for a neck abscess and/or fasciitis and/or      infection around the cervical spine.  2. Tobacco abuse.   PLAN:  1. The patient will be admitted for further evaluation and management.  2. Will start empiric antibiotic treatment with Zosyn 3.375 mg IV q.6h.  3. Will check a CT scan of the neck to rule out a significant abscess.  4. Pain management with as needed Dilaudid and oxycodone.  5. Nicotine replacement therapy.  Tobacco cessation counseling.  6. Consider consultation with a general surgeon in the a.m. for a possible      incision and drainage.  Specimens should be sent for culture and      sensitivity.  7. Assess the patient for diabetes mellitus and thyroid disease with a      hemoglobin A1c and TSH.      Elliot Cousin, M.D.  Electronically Signed     DF/MEDQ  D:  01/28/2006  T:  01/28/2006  Job:  [id number]

## 2010-11-11 ENCOUNTER — Emergency Department (HOSPITAL_COMMUNITY)
Admission: EM | Admit: 2010-11-11 | Discharge: 2010-11-11 | Disposition: A | Discharge status: Home / Self Care | Payer: Self-pay | Attending: Emergency Medicine | Admitting: Emergency Medicine

## 2010-11-11 ENCOUNTER — Emergency Department (HOSPITAL_COMMUNITY): Payer: Self-pay

## 2010-11-11 DIAGNOSIS — S92309A Fracture of unspecified metatarsal bone(s), unspecified foot, initial encounter for closed fracture: Secondary | ICD-10-CM | POA: Insufficient documentation

## 2010-11-11 DIAGNOSIS — M79609 Pain in unspecified limb: Secondary | ICD-10-CM | POA: Insufficient documentation

## 2010-11-11 DIAGNOSIS — W208XXA Other cause of strike by thrown, projected or falling object, initial encounter: Secondary | ICD-10-CM | POA: Insufficient documentation

## 2010-11-11 DIAGNOSIS — M7989 Other specified soft tissue disorders: Secondary | ICD-10-CM | POA: Insufficient documentation

## 2011-02-12 LAB — LIPID PANEL
Cholesterol: 170
LDL Cholesterol: 126 — ABNORMAL HIGH
Total CHOL/HDL Ratio: 6.8
Triglycerides: 97
VLDL: 19

## 2011-02-12 LAB — RAPID URINE DRUG SCREEN, HOSP PERFORMED
Amphetamines: NOT DETECTED
Barbiturates: NOT DETECTED
Benzodiazepines: POSITIVE — AB
Cocaine: POSITIVE — AB
Opiates: POSITIVE — AB

## 2011-02-12 LAB — CBC
HCT: 42.4
Hemoglobin: 14.4
MCHC: 33.9
MCHC: 34
MCV: 89.6
MCV: 89.9
Platelets: 178
Platelets: 205
RBC: 4.71
RDW: 13.2
WBC: 6.5

## 2011-02-12 LAB — POCT I-STAT, CHEM 8
BUN: 13
Calcium, Ion: 1.12
Chloride: 108
Creatinine, Ser: 1
Glucose, Bld: 95
HCT: 47
Hemoglobin: 16
Potassium: 3.7
Sodium: 138
TCO2: 21

## 2011-02-12 LAB — BASIC METABOLIC PANEL WITH GFR
BUN: 9
CO2: 24
Calcium: 8.5
Chloride: 102
Creatinine, Ser: 0.98
GFR calc non Af Amer: >60
Glucose, Bld: 126 — ABNORMAL HIGH
Potassium: 4
Sodium: 135

## 2011-02-12 LAB — CARDIAC PANEL(CRET KIN+CKTOT+MB+TROPI)
CK, MB: 1.2
CK, MB: 1.3
Relative Index: INVALID
Relative Index: INVALID
Total CK: 53
Total CK: 55

## 2011-02-12 LAB — CK TOTAL AND CKMB (NOT AT ARMC)
CK, MB: 1.4
Relative Index: INVALID
Total CK: 82

## 2011-02-12 LAB — POCT CARDIAC MARKERS
CKMB, poc: <1 — ABNORMAL LOW
Myoglobin, poc: 37
Myoglobin, poc: 70.3
Operator id: 282201
Operator id: 294341
Troponin i, poc: <0.05

## 2011-02-12 LAB — DIFFERENTIAL
Basophils Relative: 1
Monocytes Relative: 6
Neutro Abs: 5.3
Neutrophils Relative %: 53

## 2011-02-12 LAB — B-NATRIURETIC PEPTIDE (CONVERTED LAB): Pro B Natriuretic peptide (BNP): <30

## 2011-02-12 LAB — TROPONIN I

## 2011-02-12 LAB — D-DIMER, QUANTITATIVE

## 2011-05-08 ENCOUNTER — Encounter (HOSPITAL_COMMUNITY): Payer: Self-pay | Admitting: Emergency Medicine

## 2011-05-08 ENCOUNTER — Emergency Department (HOSPITAL_COMMUNITY)
Admission: EM | Admit: 2011-05-08 | Discharge: 2011-05-09 | Disposition: A | Discharge status: Home / Self Care | Payer: Self-pay | Attending: Emergency Medicine | Admitting: Emergency Medicine

## 2011-05-08 DIAGNOSIS — K0381 Cracked tooth: Secondary | ICD-10-CM | POA: Insufficient documentation

## 2011-05-08 DIAGNOSIS — K089 Disorder of teeth and supporting structures, unspecified: Secondary | ICD-10-CM | POA: Insufficient documentation

## 2011-05-08 DIAGNOSIS — S025XXA Fracture of tooth (traumatic), initial encounter for closed fracture: Secondary | ICD-10-CM | POA: Insufficient documentation

## 2011-05-08 DIAGNOSIS — K0889 Other specified disorders of teeth and supporting structures: Secondary | ICD-10-CM

## 2011-05-08 DIAGNOSIS — K029 Dental caries, unspecified: Secondary | ICD-10-CM | POA: Insufficient documentation

## 2011-05-08 DIAGNOSIS — K047 Periapical abscess without sinus: Secondary | ICD-10-CM | POA: Insufficient documentation

## 2011-05-08 DIAGNOSIS — IMO0002 Reserved for concepts with insufficient information to code with codable children: Secondary | ICD-10-CM | POA: Insufficient documentation

## 2011-05-08 DIAGNOSIS — F17200 Nicotine dependence, unspecified, uncomplicated: Secondary | ICD-10-CM | POA: Insufficient documentation

## 2011-05-08 NOTE — ED Notes (Signed)
PT. REPORTS LEFT LOWER MOLAR PAIN "CRACKED " LAST 2 WEEKS AGO -PAIN WORSE THIS EVENING UNRELIEVED BY OTC PAIN MEDICATION .

## 2011-05-09 MED ORDER — PENICILLIN V POTASSIUM 500 MG PO TABS: 500.0000 mg | ORAL_TABLET | Freq: Three times a day (TID) | ORAL | Status: AC

## 2011-05-09 MED ORDER — OXYCODONE-ACETAMINOPHEN 5-325 MG PO TABS
2.0000 | ORAL_TABLET | Freq: Once | ORAL | Status: AC
  Administered 2011-05-09: 2 via ORAL
  Filled 2011-05-09: qty 2

## 2011-05-09 MED ORDER — OXYCODONE-ACETAMINOPHEN 5-325 MG PO TABS: 1.0000 | ORAL_TABLET | Freq: Four times a day (QID) | ORAL | Status: AC | PRN

## 2011-05-09 NOTE — ED Provider Notes (Signed)
History     CSN: [id number]  Arrival date & time 05/08/11  2258   First MD Initiated Contact with Patient 05/09/11 0035      Chief Complaint  Patient presents with  . Dental Pain    HPI  Hx provided by the pt.  Pt complains of acute increase in left lower molar pain that occurred today.  He reports injuring tooth over 2 wks ago have a hammer slipped and hit the side of his face and cracked his tooth.  Pt reports having some immediate pains but these improved the following few days.  No pain has increased significantly over past 1-2 days.  Pt reports increased pain with eating or drinking and pressure over tooth.  He denies difficulty breathing or swallowing.  Pt denies swelling under tongue or fever, chills, or sweats.  Pt has no other significant PMH.   History reviewed. No pertinent past medical history.  History reviewed. No pertinent past surgical history.  No family history on file.  History  Substance Use Topics  . Smoking status: Current Everyday Smoker  . Smokeless tobacco: Not on file  . Alcohol Use: No      Review of Systems  Constitutional: Negative for fever and chills.  Gastrointestinal: Negative for nausea and vomiting.  All other systems reviewed and are negative.    Allergies  Review of patient's allergies indicates no known allergies.  Home Medications  No current outpatient prescriptions on file.  BP 162/104  Pulse 103  Temp(Src) 98.4 F (36.9 C) (Oral)  Resp 20  SpO2 96%  Physical Exam  Nursing note and vitals reviewed. Constitutional: He is oriented to person, place, and time. He appears well-developed and well-nourished. No distress.  HENT:  Head: Normocephalic.       Fx and decay of left lower 3rd molar to gum line.  Pain with percussion over tooth.  No significant swelling of gums.  No swelling under tongue.  No signs concerning for Ludwig's angina.  Neck: Normal range of motion.  Cardiovascular: Normal rate, regular rhythm and  normal heart sounds.   Pulmonary/Chest: Effort normal and breath sounds normal.  Lymphadenopathy:    He has no cervical adenopathy.  Neurological: He is alert and oriented to person, place, and time.  Skin: Skin is warm.  Psychiatric: He has a normal mood and affect. His behavior is normal.    ED Course  Procedures (including critical care time)  Labs Reviewed - No data to display No results found.   1. Pain, dental   2. Periapical abscess       MDM  1:00 AM patient seen and evaluated. Patient in no acute distress.         Angus Seller, PA 05/10/11 1501

## 2011-05-09 NOTE — ED Notes (Signed)
Patient presents with c/o a broken tooth to the bottom left in the back.  Stated it happened about 2 weeks ago but has continually gotten worse

## 2011-05-15 NOTE — ED Provider Notes (Signed)
Medical screening examination/treatment/procedure(s) were performed by non-physician practitioner and as supervising physician I was immediately available for consultation/collaboration.  Leinaala Catanese K Jerson Furukawa-Rasch, MD 05/15/11 2356 

## 2011-12-01 ENCOUNTER — Encounter (HOSPITAL_COMMUNITY): Payer: Self-pay | Admitting: *Deleted

## 2011-12-01 ENCOUNTER — Emergency Department (HOSPITAL_COMMUNITY)
Admission: EM | Admit: 2011-12-01 | Discharge: 2011-12-01 | Disposition: A | Discharge status: Home / Self Care | Payer: Self-pay | Attending: Emergency Medicine | Admitting: Emergency Medicine

## 2011-12-01 DIAGNOSIS — F17200 Nicotine dependence, unspecified, uncomplicated: Secondary | ICD-10-CM | POA: Insufficient documentation

## 2011-12-01 DIAGNOSIS — L732 Hidradenitis suppurativa: Secondary | ICD-10-CM | POA: Insufficient documentation

## 2011-12-01 DIAGNOSIS — L03221 Cellulitis of neck: Secondary | ICD-10-CM | POA: Insufficient documentation

## 2011-12-01 DIAGNOSIS — L0211 Cutaneous abscess of neck: Secondary | ICD-10-CM | POA: Insufficient documentation

## 2011-12-01 HISTORY — DX: Cutaneous abscess, unspecified: L02.91

## 2011-12-01 MED ORDER — CEPHALEXIN 500 MG PO CAPS: 500.0000 mg | ORAL_CAPSULE | Freq: Four times a day (QID) | ORAL | Status: AC

## 2011-12-01 MED ORDER — OXYCODONE-ACETAMINOPHEN 5-325 MG PO TABS
1.0000 | ORAL_TABLET | Freq: Once | ORAL | Status: AC
  Administered 2011-12-01: 1 via ORAL
  Filled 2011-12-01: qty 1

## 2011-12-01 MED ORDER — OXYCODONE-ACETAMINOPHEN 5-325 MG PO TABS: 1.0000 | ORAL_TABLET | ORAL | Status: AC | PRN

## 2011-12-01 NOTE — ED Provider Notes (Signed)
History     CSN: [id number]  Arrival date & time 12/01/11  0741   First MD Initiated Contact with Patient 12/01/11 [phone]      Chief Complaint  Patient presents with  . Abscess    (Consider location/radiation/quality/duration/timing/severity/associated sxs/prior treatment) Patient is a 36 y.o. male presenting with abscess. The history is provided by the patient.  Abscess  This is a new problem. The onset was sudden. The abscess is present on the neck. The abscess is characterized by redness, painfulness and swelling. Pertinent negatives include no fever, no congestion and no sore throat.  Pt with an abscess that started several days ago. States history of reccurent abscesses only on his neck. States last few times had to have it drained in OR due to pain management. Pt denies fever, chills, malaise.   Past Medical History  Diagnosis Date  . Abscess     History reviewed. No pertinent past surgical history.  History reviewed. No pertinent family history.  History  Substance Use Topics  . Smoking status: Current Everyday Smoker  . Smokeless tobacco: Not on file  . Alcohol Use: No      Review of Systems  Constitutional: Negative for fever and chills.  HENT: Positive for neck pain. Negative for congestion, sore throat, mouth sores and neck stiffness.   Respiratory: Negative.   Cardiovascular: Negative.   Skin: Positive for wound.  Neurological: Negative for dizziness and weakness.    Allergies  Review of patient's allergies indicates no known allergies.  Home Medications  No current outpatient prescriptions on file.  BP 139/92  Pulse 95  Temp 97.6 F (36.4 C) (Oral)  Resp 22  SpO2 97%  Physical Exam  Nursing note and vitals reviewed. Constitutional: He is oriented to person, place, and time. He appears well-developed and well-nourished. No distress.  HENT:  Head: Normocephalic and atraumatic.  Eyes: Conjunctivae are normal.  Neck:       Abscess - area of  induration, swelling, fluctuance to the left neck. Tender to palpation  Cardiovascular: Normal rate, regular rhythm and normal heart sounds.   Pulmonary/Chest: Effort normal and breath sounds normal. He has no wheezes. He has no rales.  Neurological: He is alert and oriented to person, place, and time.  Skin: Skin is warm and dry.  Psychiatric: He has a normal mood and affect.    ED Course  Procedures (including critical care time)  INCISION AND DRAINAGE Performed by: Jaynie Crumble A Consent: Verbal consent obtained. Risks and benefits: risks, benefits and alternatives were discussed Type: abscess  Body area: neck  Anesthesia: local infiltration  Local anesthetic: lidocaine 2% w/ epinephrine  Anesthetic total: 6 ml  Complexity: complex Blunt dissection to break up loculations  Drainage: purulent  Drainage amount: large  Packing material: 1/4 in iodoform gauze  Patient tolerance: Patient tolerated the procedure well with no immediate complications.  Pt did have a lot of pain with this procedure but he wanted me to proceed so we drained it at bedside. Will d/c home with antibiotics and follow up in 2 days for recheck.   1. Abscess of neck   2. Hydradenitis       MDM          Lottie Mussel, PA 12/01/11 1557

## 2011-12-01 NOTE — ED Provider Notes (Signed)
Medical screening examination/treatment/procedure(s) were performed by non-physician practitioner and as supervising physician I was immediately available for consultation/collaboration.   Falicity Sheets, MD 12/01/11 1627 

## 2011-12-01 NOTE — ED Notes (Signed)
To ED for treatment of abscess to back of neck. Hx of same

## 2011-12-01 NOTE — ED Notes (Signed)
Dressing clean dry intact.

## 2011-12-01 NOTE — ED Notes (Signed)
NAD noted at time of d/c home. Pt verbalized understanding of d/c inst. Dsg clean/dry/intact

## 2011-12-01 NOTE — ED Notes (Signed)
PA-C in room at this time for I&D of abscess

## 2012-02-28 ENCOUNTER — Emergency Department (HOSPITAL_COMMUNITY)
Admission: EM | Admit: 2012-02-28 | Discharge: 2012-02-28 | Disposition: A | Discharge status: Home / Self Care | Payer: Self-pay | Attending: Emergency Medicine | Admitting: Emergency Medicine

## 2012-02-28 ENCOUNTER — Encounter (HOSPITAL_COMMUNITY): Payer: Self-pay | Admitting: Emergency Medicine

## 2012-02-28 ENCOUNTER — Emergency Department (HOSPITAL_COMMUNITY): Payer: Self-pay

## 2012-02-28 DIAGNOSIS — F17200 Nicotine dependence, unspecified, uncomplicated: Secondary | ICD-10-CM | POA: Insufficient documentation

## 2012-02-28 DIAGNOSIS — K5732 Diverticulitis of large intestine without perforation or abscess without bleeding: Secondary | ICD-10-CM | POA: Insufficient documentation

## 2012-02-28 LAB — CBC WITH DIFFERENTIAL/PLATELET
Basophils Relative: 0 % (ref 0–1)
HCT: 42.2 % (ref 39.0–52.0)
Hemoglobin: 14.5 g/dL (ref 13.0–17.0)
Lymphs Abs: 2.4 10*3/uL (ref 0.7–4.0)
MCHC: 34.4 g/dL (ref 30.0–36.0)
Monocytes Absolute: 0.8 10*3/uL (ref 0.1–1.0)
Monocytes Relative: 8 % (ref 3–12)
Neutro Abs: 6.8 10*3/uL (ref 1.7–7.7)
Neutrophils Relative %: 65 % (ref 43–77)
RBC: 4.8 MIL/uL (ref 4.22–5.81)

## 2012-02-28 LAB — URINALYSIS, ROUTINE W REFLEX MICROSCOPIC
Bilirubin Urine: NEGATIVE
Glucose, UA: 250 mg/dL — AB
Hgb urine dipstick: NEGATIVE
Ketones, ur: NEGATIVE mg/dL
Nitrite: NEGATIVE
Specific Gravity, Urine: 1.021 (ref 1.005–1.030)
pH: 7.5 (ref 5.0–8.0)

## 2012-02-28 LAB — BASIC METABOLIC PANEL
BUN: 11 mg/dL (ref 6–23)
CO2: 28 mEq/L (ref 19–32)
Chloride: 101 mEq/L (ref 96–112)
Creatinine, Ser: 0.86 mg/dL (ref 0.50–1.35)
Glucose, Bld: 96 mg/dL (ref 70–99)
Potassium: 4.2 mEq/L (ref 3.5–5.1)

## 2012-02-28 MED ORDER — MORPHINE SULFATE 4 MG/ML IJ SOLN
4.0000 mg | Freq: Once | INTRAMUSCULAR | Status: AC
  Administered 2012-02-28: 4 mg via INTRAVENOUS
  Filled 2012-02-28: qty 1

## 2012-02-28 MED ORDER — KETOROLAC TROMETHAMINE 60 MG/2ML IM SOLN
60.0000 mg | Freq: Once | INTRAMUSCULAR | Status: AC
  Administered 2012-02-28: 60 mg via INTRAMUSCULAR
  Filled 2012-02-28: qty 2

## 2012-02-28 MED ORDER — CIPROFLOXACIN HCL 500 MG PO TABS: 500.0000 mg | ORAL_TABLET | Freq: Two times a day (BID) | ORAL | Status: AC

## 2012-02-28 MED ORDER — METRONIDAZOLE 500 MG PO TABS
500.0000 mg | ORAL_TABLET | Freq: Once | ORAL | Status: AC
  Administered 2012-02-28: 500 mg via ORAL
  Filled 2012-02-28: qty 1

## 2012-02-28 MED ORDER — HYDROCODONE-ACETAMINOPHEN 5-325 MG PO TABS: 2.0000 | ORAL_TABLET | Freq: Four times a day (QID) | ORAL | Status: DC | PRN

## 2012-02-28 MED ORDER — CIPROFLOXACIN IN D5W 400 MG/200ML IV SOLN
400.0000 mg | Freq: Once | INTRAVENOUS | Status: AC
  Administered 2012-02-28: 400 mg via INTRAVENOUS
  Filled 2012-02-28: qty 200

## 2012-02-28 MED ORDER — METRONIDAZOLE 500 MG PO TABS: 500.0000 mg | ORAL_TABLET | Freq: Three times a day (TID) | ORAL | Status: AC

## 2012-02-28 NOTE — ED Provider Notes (Signed)
History     CSN: [id number]  Arrival date & time 02/28/12  1403   None     Chief Complaint  Patient presents with  . Abdominal Pain    (Consider location/radiation/quality/duration/timing/severity/associated sxs/prior treatment) Patient is a 36 y.o. male presenting with abdominal pain. The history is provided by the patient and the spouse.  Abdominal Pain The primary symptoms of the illness include abdominal pain. The primary symptoms of the illness do not include fever, fatigue, shortness of breath, nausea, vomiting, diarrhea or dysuria. The current episode started 6 to 12 hours ago (7 AM this morning). The onset of the illness was sudden. The problem has not changed since onset. The abdominal pain began 6 to 12 hours ago. The pain came on suddenly. The abdominal pain has been unchanged since its onset. The abdominal pain is located in the LLQ. The abdominal pain does not radiate. The abdominal pain is relieved by being still. The abdominal pain is exacerbated by certain positions and movement (standing and walking).  The patient has not had a change in bowel habit. Symptoms associated with the illness do not include constipation, hematuria, frequency or back pain.    Past Medical History  Diagnosis Date  . Abscess     History reviewed. No pertinent past surgical history.  No family history on file.  History  Substance Use Topics  . Smoking status: Current Every Day Smoker  . Smokeless tobacco: Not on file  . Alcohol Use: No      Review of Systems  Constitutional: Negative for fever and fatigue.  Respiratory: Negative for cough and shortness of breath.   Cardiovascular: Negative for chest pain.  Gastrointestinal: Positive for abdominal pain. Negative for nausea, vomiting, diarrhea and constipation.  Genitourinary: Negative for dysuria, frequency and hematuria.  Musculoskeletal: Negative for back pain.  Neurological: Negative for headaches.  All other systems reviewed  and are negative.    Allergies  Review of patient's allergies indicates no known allergies.  Home Medications  No current outpatient prescriptions on file.  BP 152/94  Pulse 82  Temp 97.9 F (36.6 C) (Oral)  Resp 16  SpO2 99%  Physical Exam  Nursing note and vitals reviewed. Constitutional: He is oriented to person, place, and time. He appears well-developed and well-nourished. No distress.  HENT:  Head: Normocephalic and atraumatic.  Eyes: Pupils are equal, round, and reactive to light.  Cardiovascular: Normal rate and normal heart sounds.   Pulmonary/Chest: Effort normal and breath sounds normal. No respiratory distress.  Abdominal: Soft. He exhibits no distension. There is no tenderness.       Pt with pain upon examiner lifting panus (but on on left side); no signs of cellulitis  Musculoskeletal: Normal range of motion.  Neurological: He is alert and oriented to person, place, and time.  Skin: Skin is warm and dry.  Psychiatric: He has a normal mood and affect.    ED Course  Procedures (including critical care time)   Labs Reviewed  CBC WITH DIFFERENTIAL  BASIC METABOLIC PANEL  URINALYSIS, ROUTINE W REFLEX MICROSCOPIC   Ct Abdomen Pelvis Wo Contrast  02/28/2012  *RADIOLOGY REPORT*  Clinical Data: Left flank and abdominal pain.  CT ABDOMEN AND PELVIS WITHOUT CONTRAST  Technique:  Multidetector CT imaging of the abdomen and pelvis was performed following the standard protocol without intravenous contrast.  Comparison: None.  Findings: The lung bases are clear without focal nodule, mass, or airspace disease.  The heart size is normal.  No significant  to pleural or pericardial effusion is present.  The liver and spleen are within normal limits.  The stomach, duodenum, and pancreas are within normal limits as well.  Common bile duct is unremarkable.  Gallbladder is collapsed.  The adrenal glands are normal bilaterally.  No significant nephrolithiasis or hydronephrosis is  present.  The ureters are within normal limits bilaterally.  The urinary bladder is normal.  There is focal inflammatory change and layering fluid in the left retroperitoneal space adjacent to the distal descending colon and proximal sigmoid colon.  No discrete abscess or free air is evident.  The rectum is unremarkable.  The more proximal colon is within normal limits. The appendix is visualized and normal.  Small bowel is unremarkable.  No significant adenopathy is present.  The bone windows are unremarkable.  IMPRESSION:  1.  Descending and proximal sigmoid colonic diverticulitis with significant retroperitoneal stranding and some layering of fluid but no discrete abscess. 2.  No evidence for nephrolithiasis or hydronephrosis.   Original Report Authenticated By: Jamesetta Orleans. MATTERN, M.D.      1. Diverticulitis large intestine       MDM  6:03 PM Pt seen and examined. Pt with three episodes of left lower abdominal pain during the last two months. Pt denies any associated symptoms (blood in urine, diarrhea etc) so feel that this is most likely MSK pain. As patient is obese, unable to get actual abdominal exam on him. Due to concern about this and possibly stone, will get non-contrast CT scan.   11:02 PM Pt with diverticulitis. He has gotten first dose of antibiotics here. Will treat pain as well. Will discharge patient.       Daleen Bo, MD 02/28/12 [phone]

## 2012-02-28 NOTE — ED Notes (Signed)
Rx given x3 Pt ambulating independently w/ steady gait on d/c in no acute distress, A&Ox4. D/c instructions reviewed w/ pt and family - pt and family deny any further questions or concerns at present.  

## 2012-02-28 NOTE — ED Provider Notes (Signed)
I saw and evaluated the patient, reviewed the resident's note and I agree with the findings and plan.   Caya Soberanis, MD 02/28/12 2343 

## 2012-02-28 NOTE — ED Notes (Addendum)
Pt presents to department for evaluation of L sided abdominal pain. Onset this morning. Denies nausea/vomiting. L sided abdominal tenderness upon exam. 8/10 sharp stabbing pain at the time. Last bowel movement today was normal. Bowel sounds present all quadrants. He is conscious alert and oriented x4. No signs of acute distress at the time.

## 2012-02-28 NOTE — ED Notes (Signed)
Pt. Stated, I've had stomach pan this makes the 3rd time in the last month.  This time it started this morning.

## 2012-02-28 NOTE — ED Notes (Signed)
Patient transported to CT 

## 2012-02-28 NOTE — ED Notes (Signed)
Pt updated on care 

## 2012-03-04 ENCOUNTER — Ambulatory Visit: Payer: BC Managed Care – PPO

## 2012-03-04 ENCOUNTER — Ambulatory Visit (INDEPENDENT_AMBULATORY_CARE_PROVIDER_SITE_OTHER): Payer: BC Managed Care – PPO | Admitting: Family Medicine

## 2012-03-04 VITALS — BP 131/90 | HR 97 | Temp 98.1°F | Resp 18 | Ht 69.0 in | Wt 378.0 lb

## 2012-03-04 DIAGNOSIS — K5792 Diverticulitis of intestine, part unspecified, without perforation or abscess without bleeding: Secondary | ICD-10-CM

## 2012-03-04 DIAGNOSIS — R079 Chest pain, unspecified: Secondary | ICD-10-CM

## 2012-03-04 DIAGNOSIS — K5732 Diverticulitis of large intestine without perforation or abscess without bleeding: Secondary | ICD-10-CM

## 2012-03-04 LAB — POCT UA - MICROSCOPIC ONLY
Casts, Ur, LPF, POC: NEGATIVE
Crystals, Ur, HPF, POC: NEGATIVE
Yeast, UA: NEGATIVE

## 2012-03-04 LAB — POCT CBC
Granulocyte percent: 65 %G (ref 37–80)
HCT, POC: 50.8 % (ref 43.5–53.7)
Hemoglobin: 16.1 g/dL (ref 14.1–18.1)
Lymph, poc: 2.8 (ref 0.6–3.4)
MCH, POC: 29.6 pg (ref 27–31.2)
MCHC: 31.7 g/dL — AB (ref 31.8–35.4)
MCV: 93.3 fL (ref 80–97)
MID (cbc): 0.7 (ref 0–0.9)
MPV: 9.5 fL (ref 0–99.8)
POC Granulocyte: 6.4 (ref 2–6.9)
POC LYMPH PERCENT: 20.1 %L (ref 10–50)
POC MID %: 6.9 %M (ref 0–12)
Platelet Count, POC: 320 10*3/uL (ref 142–424)
RBC: 5.44 M/uL (ref 4.69–6.13)
RDW, POC: 13.2 %
WBC: 9.9 10*3/uL (ref 4.6–10.2)

## 2012-03-04 LAB — POCT URINALYSIS DIPSTICK
Bilirubin, UA: NEGATIVE
Blood, UA: NEGATIVE
Glucose, UA: NEGATIVE
Ketones, UA: NEGATIVE
Leukocytes, UA: NEGATIVE
Nitrite, UA: NEGATIVE
Protein, UA: NEGATIVE
Spec Grav, UA: 1.025
Urobilinogen, UA: 0.2
pH, UA: 5.5

## 2012-03-04 MED ORDER — TRAMADOL HCL 50 MG PO TABS: 50.0000 mg | ORAL_TABLET | Freq: Three times a day (TID) | ORAL | Status: DC | PRN

## 2012-03-04 NOTE — Progress Notes (Signed)
36 year old obese man comes in with left sided abdominal pain over the last 2 months. It had been intermittent until last Saturday when he developed acute and worsening left-sided abdominal pain and he went to the emergency room. At the emergency room he was found to have diverticulitis by CAT scan) Cipro and Flagyl. He's continued to have pain in the left side since without nausea vomiting or diarrhea. He says his pain is 4/10. He's not had any fever. He's continued to have regular bowel movements.  Patient is taking Norco 5/325 for pain but is doing very little.  Objective: Obese young adult male in no acute distress who moves carefully on the exam table to avoid pain. Patient seen with wife in the room  Chest: Clear but he is tender in the right lateral rib cage where he says he was struck by his waist elbow recently Heart: Regular Abdomen: Markedly obese, tender left side with some guarding and questionable rebound.  Skin: No jaundice Extremities: No edema, no rash  [facility] reading (PRIMARY) by  Dr. Milus Glazier  KUB .  Findings: The lung bases are clear without focal nodule, mass, or  airspace disease. The heart size is normal. No significant to  pleural or pericardial effusion is present.  The liver and spleen are within normal limits. The stomach,  duodenum, and pancreas are within normal limits as well. Common  bile duct is unremarkable. Gallbladder is collapsed. The adrenal  glands are normal bilaterally. No significant nephrolithiasis or  hydronephrosis is present. The ureters are within normal limits  bilaterally. The urinary bladder is normal. There is focal  inflammatory change and layering fluid in the left retroperitoneal  space adjacent to the distal descending colon and proximal sigmoid  colon. No discrete abscess or free air is evident. The rectum is  unremarkable. The more proximal colon is within normal limits.  The appendix is visualized and normal. Small bowel is    unremarkable. No significant adenopathy is present.  The bone windows are unremarkable.  IMPRESSION:  1. Descending and proximal sigmoid colonic diverticulitis with  significant retroperitoneal stranding and some layering of fluid  but no discrete abscess.  2. No evidence for nephrolithiasis or hydronephrosis.  Original Report Authenticated By: Jamesetta Orleans. MATTERN, M.D.    Results for orders placed in visit on 03/04/12  POCT UA - MICROSCOPIC ONLY      Component Value Range   WBC, Ur, HPF, POC 1-3     RBC, urine, microscopic 0-2     Bacteria, U Microscopic trace     Mucus, UA trace     Epithelial cells, urine per micros 2-4     Crystals, Ur, HPF, POC neg     Casts, Ur, LPF, POC neg     Yeast, UA neg    POCT URINALYSIS DIPSTICK      Component Value Range   Color, UA amber     Clarity, UA clear     Glucose, UA neg     Bilirubin, UA neg     Ketones, UA neg     Spec Grav, UA 1.025     Blood, UA neg     pH, UA 5.5     Protein, UA neg     Urobilinogen, UA 0.2     Nitrite, UA neg     Leukocytes, UA Negative    POCT CBC      Component Value Range   WBC 9.9  4.6 - 10.2 K/uL   Lymph, poc  2.8  0.6 - 3.4   POC LYMPH PERCENT 20.1  10 - 50 %L   MID (cbc) 0.7  0 - 0.9   POC MID % 6.9  0 - 12 %M   POC Granulocyte 6.4  2 - 6.9   Granulocyte percent 65.0  37 - 80 %G   RBC 5.44  4.69 - 6.13 M/uL   Hemoglobin 16.1  14.1 - 18.1 g/dL   HCT, POC 16.1  09.6 - 53.7 %   MCV 93.3  80 - 97 fL   MCH, POC 29.6  27 - 31.2 pg   MCHC 31.7 (*) 31.8 - 35.4 g/dL   RDW, POC 04.5     Platelet Count, POC 320  142 - 424 K/uL   MPV 9.5  0 - 99.8 fL

## 2012-03-04 NOTE — Patient Instructions (Signed)
Diverticulitis °A diverticulum is a small pouch or sac on the colon. Diverticulosis is the presence of these diverticula on the colon. Diverticulitis is the irritation (inflammation) or infection of diverticula. °CAUSES  °The colon and its diverticula contain bacteria. If food particles block the tiny opening to a diverticulum, the bacteria inside can grow and cause an increase in pressure. This leads to infection and inflammation and is called diverticulitis. °SYMPTOMS  °· Abdominal pain and tenderness. Usually, the pain is located on the left side of your abdomen. However, it could be located elsewhere. °· Fever. °· Bloating. °· Feeling sick to your stomach (nausea). °· Throwing up (vomiting). °· Abnormal stools. °DIAGNOSIS  °Your caregiver will take a history and perform a physical exam. Since many things can cause abdominal pain, other tests may be necessary. Tests may include: °· Blood tests. °· Urine tests. °· X-ray of the abdomen. °· CT scan of the abdomen. °Sometimes, surgery is needed to determine if diverticulitis or other conditions are causing your symptoms. °TREATMENT  °Most of the time, you can be treated without surgery. Treatment includes: °· Resting the bowels by only having liquids for a few days. As you improve, you will need to eat a low-fiber diet. °· Intravenous (IV) fluids if you are losing body fluids (dehydrated). °· Antibiotic medicines that treat infections may be given. °· Pain and nausea medicine, if needed. °· Surgery if the inflamed diverticulum has burst. °HOME CARE INSTRUCTIONS  °· Try a clear liquid diet (broth, tea, or water for as long as directed by your caregiver). You may then gradually begin a low-fiber diet as tolerated. A low-fiber diet is a diet with less than 10 grams of fiber. Choose the foods below to reduce fiber in the diet: °· White breads, cereals, rice, and pasta. °· Cooked fruits and vegetables or soft fresh fruits and vegetables without the skin. °· Ground or  well-cooked tender beef, ham, veal, lamb, pork, or poultry. °· Eggs and seafood. °· After your diverticulitis symptoms have improved, your caregiver may put you on a high-fiber diet. A high-fiber diet includes 14 grams of fiber for every 1000 calories consumed. For a standard 2000 calorie diet, you would need 28 grams of fiber. Follow these diet guidelines to help you increase the fiber in your diet. It is important to slowly increase the amount fiber in your diet to avoid gas, constipation, and bloating. °· Choose whole-grain breads, cereals, pasta, and brown rice. °· Choose fresh fruits and vegetables with the skin on. Do not overcook vegetables because the more vegetables are cooked, the more fiber is lost. °· Choose more nuts, seeds, legumes, dried peas, beans, and lentils. °· Look for food products that have greater than 3 grams of fiber per serving on the Nutrition Facts label. °· Take all medicine as directed by your caregiver. °· If your caregiver has given you a follow-up appointment, it is very important that you go. Not going could result in lasting (chronic) or permanent injury, pain, and disability. If there is any problem keeping the appointment, call to reschedule. °SEEK MEDICAL CARE IF:  °· Your pain does not improve. °· You have a hard time advancing your diet beyond clear liquids. °· Your bowel movements do not return to normal. °SEEK IMMEDIATE MEDICAL CARE IF:  °· Your pain becomes worse. °· You have an oral temperature above 102° F (38.9° C), not controlled by medicine. °· You have repeated vomiting. °· You have bloody or black, tarry stools. °· Symptoms   that brought you to your caregiver become worse or are not getting better. °MAKE SURE YOU:  °· Understand these instructions. °· Will watch your condition. °· Will get help right away if you are not doing well or get worse. °Document Released: 02/12/2005 Document Revised: 07/28/2011 Document Reviewed: 06/10/2010 °ExitCare® Patient Information  ©2013 [employer]. ° °

## 2012-07-17 ENCOUNTER — Encounter (HOSPITAL_COMMUNITY): Payer: Self-pay | Admitting: Emergency Medicine

## 2012-07-17 ENCOUNTER — Emergency Department (HOSPITAL_COMMUNITY)
Admission: EM | Admit: 2012-07-17 | Discharge: 2012-07-17 | Disposition: A | Discharge status: Home / Self Care | Payer: BC Managed Care – PPO | Attending: Emergency Medicine | Admitting: Emergency Medicine

## 2012-07-17 DIAGNOSIS — F17200 Nicotine dependence, unspecified, uncomplicated: Secondary | ICD-10-CM | POA: Insufficient documentation

## 2012-07-17 DIAGNOSIS — L03221 Cellulitis of neck: Secondary | ICD-10-CM | POA: Insufficient documentation

## 2012-07-17 DIAGNOSIS — L0211 Cutaneous abscess of neck: Secondary | ICD-10-CM

## 2012-07-17 MED ORDER — SULFAMETHOXAZOLE-TRIMETHOPRIM 800-160 MG PO TABS: 1.0000 | ORAL_TABLET | Freq: Two times a day (BID) | ORAL | Status: DC

## 2012-07-17 MED ORDER — LIDOCAINE HCL 2 % IJ SOLN
5.0000 mL | Freq: Once | INTRAMUSCULAR | Status: AC
  Administered 2012-07-17: 100 mg via INTRADERMAL

## 2012-07-17 MED ORDER — LIDOCAINE-EPINEPHRINE-TETRACAINE (LET) SOLUTION
3.0000 mL | Freq: Once | NASAL | Status: AC
  Administered 2012-07-17: 3 mL via TOPICAL
  Filled 2012-07-17: qty 3

## 2012-07-17 MED ORDER — OXYCODONE-ACETAMINOPHEN 5-325 MG PO TABS: 1.0000 | ORAL_TABLET | ORAL | Status: DC | PRN

## 2012-07-17 MED ORDER — OXYCODONE-ACETAMINOPHEN 5-325 MG PO TABS
2.0000 | ORAL_TABLET | Freq: Once | ORAL | Status: AC
  Administered 2012-07-17: 2 via ORAL
  Filled 2012-07-17: qty 2

## 2012-07-17 MED ORDER — CEPHALEXIN 500 MG PO CAPS: 500.0000 mg | ORAL_CAPSULE | Freq: Four times a day (QID) | ORAL | Status: DC

## 2012-07-17 NOTE — Discharge Instructions (Signed)
1. Medications: keflex, bactrim, usual home medications 2. Treatment: rest, drink plenty of fluids, compress 3 times per day, wash with warm soap and water twice per day 3. Follow Up: Please followup with your primary doctor for discussion of your diagnoses and further evaluation after today's visit; if you do not have a primary care doctor use the resource guide provided to find one; followup with general surgery for discussion of excision  Followup with your doctor or an urgent care for wound check in 48-72 hours. You may return to the emergency department if you have  a fever that persists greater than 101 or your abscess appears to become infected (growing surrounding redness and warmth). Do not operate any heavy machinery while on pain medications. Do not consume alcohol on these medications either.  Abscess An abscess (boil or furuncle) is an infected area that contains a collection of pus.  SYMPTOMS Signs and symptoms of an abscess include pain, tenderness, redness, or hardness. You may feel a moveable soft area under your skin. An abscess can occur anywhere in the body.  TREATMENT  A surgical cut (incision) may be made over your abscess to drain the pus. Gauze may be packed into the space or a drain may be looped through the abscess cavity (pocket). This provides a drain that will allow the cavity to heal from the inside outwards. The abscess may be painful for a few days, but should feel much better if it was drained.  Your abscess, if seen early, may not have localized and may not have been drained. If not, another appointment may be required if it does not get better on its own or with medications. HOME CARE INSTRUCTIONS   Only take over-the-counter or prescription medicines for pain, discomfort, or fever as directed by your caregiver.   Take your antibiotics as directed if they were prescribed. Finish them even if you start to feel better.   Keep the skin and clothes clean around your  abscess.   If the abscess was drained, you will need to use gauze dressing to collect any draining pus. Dressings will typically need to be changed 3 or more times a day.   The infection may spread by skin contact with others. Avoid skin contact as much as possible.   Practice good hygiene. This includes regular hand washing, cover any draining skin lesions, and do not share personal care items.   If you participate in sports, do not share athletic equipment, towels, whirlpools, or personal care items. Shower after every practice or tournament.   If a draining area cannot be adequately covered:   Do not participate in sports.   Children should not participate in day care until the wound has healed or drainage stops.   If your caregiver has given you a follow-up appointment, it is very important to keep that appointment. Not keeping the appointment could result in a much worse infection, chronic or permanent injury, pain, and disability. If there is any problem keeping the appointment, you must call back to this facility for assistance.  SEEK MEDICAL CARE IF:   You develop increased pain, swelling, redness, drainage, or bleeding in the wound site.   You develop signs of generalized infection including muscle aches, chills, fever, or a general ill feeling.   You have an oral temperature above 102 F (38.9 C).  MAKE SURE YOU:   Understand these instructions.   Will watch your condition.   Will get help right away if you are  not doing well or get worse.  Document Released: 02/12/2005 Document Revised: 01/15/2011 Document Reviewed: 12/07/2007 [facility] Patient Information 2012 [place], [place].   RESOURCE GUIDE  Chronic Pain Problems: Contact [facility]  [phone] Patients need to be referred by their primary care doctor.  Insufficient Money for Medicine: Contact United Way:  call "211."   No Primary Care Doctor: - Call Health Connect  [phone] - can help  you locate a primary care doctor that  accepts your insurance, provides certain services, etc. - Physician Referral Service- [phone]  Agencies that provide inexpensive medical care: - [facility]  [phone] - [facility]  [phone] - [facility]  [phone] - Women's Clinic  [phone] - Planned Parenthood  [phone] [facility]  [phone]  Medicaid-accepting [facility] Providers: - [facility]- [address]  [phone], Mon-Fri 9am-7pm, Sat 9am-1pm - [facility]- [address] [place], Suite [place]  [phone] - [facility]- [address], Suite [place]  [phone] [facility]- [address]  [phone] - Renaye Rakers- [address] [place], [address], [phone]  Only accepts [place] patients after they have their name  applied to their card  Self Pay (no insurance) in [place]: - Sickle Cell Patients: Dr Willey Blade, [facility] Internal Medicine  [address] [place], [phone] - [facility]- [address] [place]  [phone]       [facility] [place]- [address]       -     [facility]- see information above (Speak to [employer] if you do not have insurance)       -  [facility]- [address],  [phone]       -  [facility]- [address], [phone]       -  Dr Julio Sicks-  [address], [place], [phone]       -  [facility] - [address] [phone]       -  [facility]- [address] [phone], also [address] [phone]       -    [facility]- [address] [place], [phone], 1st & 3rd Saturday        every month, 10am-1pm  [facility] [address] [place], [place] [address] [phone]  [facility] [address] [place], [place] [address] [phone]  1) Find a Doctor and Pay Out of Pocket Although you won't have to find out who is covered by your insurance plan, it is a good idea to ask around and get recommendations. You will then need to call the office and see if the doctor you have chosen will accept you as a new patient and what types of options they offer for patients who are self-pay. Some doctors offer discounts or will set up payment plans for their patients who do not have insurance, but you will need to ask so you aren't surprised when you get to your appointment.  2) Contact Your Local Health Department Not all health departments have doctors that can see patients for sick visits, but many do, so it is worth a call to see if  yours does. If you don't know where your local health department is, you can check in your phone book. The CDC also has a tool to help you locate your state's health department, and many state websites also have listings of all of their local health departments.  3) Find a Walk-in Clinic If your illness is not likely to be very severe or complicated, you may want to try a walk in clinic. These are popping up all over the country in pharmacies, drugstores, and shopping centers. They're usually staffed by nurse practitioners or physician assistants that have been trained to treat common illnesses and complaints. They're usually fairly quick and inexpensive. However, if you have serious medical issues or chronic medical problems, these are probably not your best option  STD Testing - [facility] Department of [facility] [place], STD Clinic, [address], [place], phone [phone] or [phone].  Monday - Friday, call for an appointment. [facility] of [employer], STD Clinic, [address], [place], phone [phone] or [phone].  Monday - Friday, call for an appointment.  Abuse/Neglect: [facility]  Child Abuse Hotline [phone]) 609-7621 [facility] Child Abuse Hotline 671-565-1385 (After Hours)  Emergency Shelter:  [facility] (224) [phone]  Maternity Homes: - Room at the [place] [facility] (838)817-9376 - [facility] [phone]  MRSA Hotline #:   (671)397-4132  Dental Assistance If unable to pay or uninsured, contact:  [facility]. to become qualified for the adult dental clinic.  Patients with Medicaid: [facility] (867)[phone] [address], [phone] [address], [phone]  If unable to pay, or uninsured, contact [facility] [phone] in [place], [phone] in [facility]) to become qualified for the adult dental clinic  [facility] [address] [place], [place] [address] [phone] [email or web address]  Other Proofreader Services: - [facility]- [address] [place], [place], [place], [address], [phone], Ext. 123, 2nd and 4th Thursday of the month at 6:30am.  10 clients each day by appointment, can sometimes see walk-in patients if someone does not show for an appointment. [facility]- [address] [place], [place], [address], [phone] - [facility] [address] [place], [place], [address], [phone] - [facility]- [phone] [facility] Health Department- [phone] [facility] Health [phone]       Behavioral Health Resources in the [facility]  Intensive Outpatient Programs: [facility]      [address] [place], [place] [phone] Both a day and evening program       [facility] Outpatient     [address]        [place], [place] [address] [phone]         ADS: [facility] [address] [place] [phone]  [facility] Mental Health ACCESS LINE: 9591197945 or  [phone] [address] [place], [place] [address] [email or web address]   Substance Abuse Resources: - [facility] Drug [facility]  (661)301-2964 - [facility] [phone])395-0879 - [facility] (931)541-6590 Floydene Flock [phone] - Residential & Outpatient Substance Abuse Program  [phone]  Psychological Services: [facility]  [phone] [facility] Services  [phone] - [facility], [phone] [place]. [address], [place], ACCESS LINE: [phone] or [phone], [email or web address]  Mobile Crisis Teams:  [facility]         Mobile Crisis Care Unit [phone]             [facility] [address] Ginette Otto [phone]                                         Interventionist [address] [place] [place] [phone]  Self-Help/Support Groups: Mental Health Assoc. of [employer] of support groups 684-190-3130 (call for more info)   Narcotics Anonymous (NA) [facility] [address] [place] - 2 meetings at this location  Residential Treatment Programs:  [facility]      [address]        [place]       [phone]         [facility] [address], [place] [address] [place], [place]  [address] [phone]  [facility]  [address] [place], [place] [address] [phone] Admissions: 8am-3pm M-F  [facility]     [address]        [place], [place] [address]       [phone]         [facility] [address] [place], [place] [phone]  [facility] [address] [place], [place] [phone]  [facility] - Intensive Outpatient      [address] [place] [address]     [place],  [place]       [phone]         [facility] ([facility]     [address] [place], [place] [phone] or [phone]) [phone]  Residential Treatment Services (RTS), Medicaid [address] [place], [place] [phone]  Fellowship [address]                                               [address] [place] [phone]  [facility] Resources: [phone]               General Therapy                                                Angie Fava, PhD        [address] [place], [place] [address]         [phone]   Insurance  [facility]   [address] [place], [place] [address] [phone]  [facility] [address] [place], [place] [address] [phone] Insurance/Medicaid/sponsorship through [facility]  Faith and Families                                              [address] [phone]  [place], [place] [address]    Therapy/tele-psych/case         [phone]          [facility] [address], [place]  [address]  Adolescent/group home/case management [phone]                                           Creola Corn PhD       General therapy       Insurance   [phone]         Dr. Lolly Mustache, [place], M-F [phone]  [facility]  United Way [facility] Dept. [address]                 [address]         [address] [place] [address]  Blondell Reveal Phone:  [phone]                                  Phone:  [phone]                   Phone:  [phone]  [facility] Mental Health, [phone] - [facility] - [facility]- [phone]       -     [facility] in [place], [address]             [phone],  Insurance  [place] Child Abuse Hotline (360)313-8902 or 769-262-4653 (After Hours)

## 2012-07-17 NOTE — ED Provider Notes (Signed)
Medical screening examination/treatment/procedure(s) were performed by non-physician practitioner and as supervising physician I was immediately available for consultation/collaboration.  Ethelda Chick, MD 07/17/12 [phone]

## 2012-07-17 NOTE — ED Provider Notes (Signed)
History  This chart was scribed for non-physician practitioner working with Ethelda Chick, MD, by Candelaria Stagers, ED Scribe. This patient was seen in room WTR7/WTR7 and the patient's care was started at 4:11 PM   CSN: [id number]  Arrival date & time 07/17/12  1523   First MD Initiated Contact with Patient 07/17/12 1541      Chief Complaint  Patient presents with  . Abscess    raised, tender area on l/side of neck     The history is provided by the patient. No language interpreter was used.   Albert White is a 37 y.o. male who presents to the Emergency Department complaining of an abscess to the back of his head that he noticed yesterday which has gradually worsened since then.  Pt reports that he has h/o abscess in the same area over the last 15 years with the last abscess to this area 5 months ago.  The area is tender.  He denies fever, chills, or vomiting.  He reports he is experiencing nausea.  Nothing seems to make the sx better or worse.  Pt reports that in the past local anesthetic has not worked for I&D.      Past Medical History  Diagnosis Date  . Abscess     History reviewed. No pertinent past surgical history.  History reviewed. No pertinent family history.  History  Substance Use Topics  . Smoking status: Current Every Day Smoker  . Smokeless tobacco: Not on file  . Alcohol Use: Yes      Review of Systems  Constitutional: Negative for fever and chills.  Gastrointestinal: Positive for nausea. Negative for vomiting.  Skin: Positive for wound (abscess to back of head).  All other systems reviewed and are negative.    Allergies  Review of patient's allergies indicates no known allergies.  Home Medications   Current Outpatient Rx  Name  Route  Sig  Dispense  Refill  . cephALEXin (KEFLEX) 500 MG capsule   Oral   Take 1 capsule (500 mg total) by mouth 4 (four) times daily.   40 capsule   0   . oxyCODONE-acetaminophen (PERCOCET) 5-325 MG per  tablet   Oral   Take 1 tablet by mouth every 4 (four) hours as needed for pain.   20 tablet   0   . sulfamethoxazole-trimethoprim (SEPTRA DS) 800-160 MG per tablet   Oral   Take 1 tablet by mouth every 12 (twelve) hours.   20 tablet   0     BP 106/64  Pulse 92  Temp(Src) 98.2 F (36.8 C) (Oral)  SpO2 99%  Physical Exam  Nursing note and vitals reviewed. Constitutional: He is oriented to person, place, and time. He appears well-developed and well-nourished. No distress.  HENT:  Head: Normocephalic and atraumatic.  Mouth/Throat: Oropharynx is clear and moist.  Eyes: Conjunctivae are normal. Pupils are equal, round, and reactive to light. No scleral icterus.  Neck:    Decreased ROM 2/2 pain  Cardiovascular: Normal rate, regular rhythm, normal heart sounds and intact distal pulses.  Exam reveals no gallop and no friction rub.   No murmur heard. Pulmonary/Chest: Effort normal and breath sounds normal. No respiratory distress. He has no wheezes. He has no rales. He exhibits no tenderness.  Abdominal: Soft. He exhibits no distension. There is no tenderness. There is no rebound.  Lymphadenopathy:    He has no cervical adenopathy.  Neurological: He is alert and oriented to person, place, and time.  No cranial nerve deficit. He exhibits normal muscle tone. Coordination normal.  Skin: Skin is warm and dry. No rash noted. He is not diaphoretic. No erythema.  3 x 5 cm erythematous fluctuant area on the left posterior neck with induration and accompanying moderate cellulitis; no streaking  Psychiatric: He has a normal mood and affect.    ED Course  Procedures   DIAGNOSTIC STUDIES: Oxygen Saturation is 99% on room air, normal by my interpretation.    COORDINATION OF CARE:  4:16 PM Discussed course of care with pt at bedside which includes I&D of abscess.  Will give pain medication before the I&D.  Pt understands and agrees.   4:30 PM Ordered: 2 tablets 5-325 MG  Percocet  INCISION AND DRAINAGE PROCEDURE NOTE: Patient identification was confirmed and verbal consent was obtained. This procedure was performed by Dierdre Forth, PA-C at 5:10 PM. Site: left base of neck Sterile procedures observed Anesthetic used (type and amt): 2 % lidocaine without epi Blade size: 11 Drainage: moderate Complexity: Complex Site anesthetized, incision made over site, wound drained and explored loculations, rinsed with copious amounts of normal saline, wound covered with dry sterile dressing.  Pt tolerated procedure well without complications.  Instructions for care discussed verbally and pt provided with additional written instructions for homecare and f/u.    Labs Reviewed - No data to display No results found.   1. Abscess, neck   2. Cellulitis and abscess of neck       MDM  Albert White presents with recurrent neck abscess.  Patient with skin abscess amenable to incision and drainage.  Abscess was not large enough to warrant packing or drain,  wound recheck in 2 days. Encouraged home warm soaks and flushing.  Moderate signs of cellulitis is surrounding skin.  Will d/c to home with bactrim and keflex.  Pt is being referred to general surgery for discussion of excision.    1. Medications: keflex, bactrim, usual home medications 2. Treatment: rest, drink plenty of fluids, compress 3 times per day, wash with warm soap and water twice per day 3. Follow Up: Please followup with your primary doctor for discussion of your diagnoses and further evaluation after today's visit; if you do not have a primary care doctor use the resource guide provided to find one; followup with general surgery for discussion of excision  I personally performed the services described in this documentation, which was scribed in my presence. The recorded information has been reviewed and is accurate.         Dahlia Client Afrika Brick, PA-C 07/17/12 1732

## 2012-07-17 NOTE — ED Notes (Signed)
Pt reports recurrent abscess .  Raised area on back of neck-l/side, tender 3.5 cm x 2 cm swollen area noted

## 2012-08-09 ENCOUNTER — Encounter (INDEPENDENT_AMBULATORY_CARE_PROVIDER_SITE_OTHER): Payer: Self-pay | Admitting: Surgery

## 2012-08-09 ENCOUNTER — Telehealth (INDEPENDENT_AMBULATORY_CARE_PROVIDER_SITE_OTHER): Payer: Self-pay

## 2012-08-09 ENCOUNTER — Ambulatory Visit (INDEPENDENT_AMBULATORY_CARE_PROVIDER_SITE_OTHER): Payer: BC Managed Care – PPO | Admitting: Surgery

## 2012-08-09 VITALS — BP 122/88 | HR 69 | Temp 97.7°F | Ht 70.0 in | Wt 372.8 lb

## 2012-08-09 DIAGNOSIS — L732 Hidradenitis suppurativa: Secondary | ICD-10-CM | POA: Insufficient documentation

## 2012-08-09 NOTE — Progress Notes (Signed)
General Surgery [facility]  Chief Complaint  Patient presents with  . New Evaluation    eval neck abscess - referral from Dr. Jerelyn Scott, Emergency Dept, [facility]    HISTORY: Patient is a 37 year old white male with long-standing history of recurrent abscess he is involving the neck and posterior scalp. He has undergone multiple procedures under local anesthesia with incision and drainage. He now presents on referral from the emergency department for evaluation for definitive surgical management.  Patient denies any involvement of the axilla, groin, or other body surface areas. He has never been evaluated by dermatology.  Past Medical History  Diagnosis Date  . Abscess      No current outpatient prescriptions on file.   No current facility-administered medications for this visit.     No Known Allergies   History reviewed. No pertinent family history.   History   Social History  . Marital Status: Married    Spouse Name: N/A    Number of Children: N/A  . Years of Education: N/A   Social History Main Topics  . Smoking status: Current Every Day Smoker -- 1.00 packs/day    Types: Cigarettes  . Smokeless tobacco: None  . Alcohol Use: No  . Drug Use: No  . Sexually Active: None   Other Topics Concern  . None   Social History Narrative  . None     REVIEW OF SYSTEMS - PERTINENT POSITIVES ONLY: Involvement of posterior neck and scalp. No involvement of other body surface areas.  EXAM: Filed Vitals:   08/09/12 0919  BP: 122/88  Pulse: 69  Temp: 97.7 F (36.5 C)    HEENT: normocephalic; pupils equal and reactive; sclerae clear; dentition fair; mucous membranes moist NECK:  symmetric on extension; no palpable anterior or posterior cervical lymphadenopathy; no supraclavicular masses; no tenderness;posterior neck with multiple sinus tracts, multiple healed surgical scars, areas of induration and inflammation and mild tenderness, and  numerous sinuses involving the posterior scalp, posterior neck, and upper back CHEST: clear to auscultation bilaterally without rales, rhonchi, or wheezes CARDIAC: regular rate and rhythm without significant murmur; peripheral pulses are full EXT:  non-tender without edema; no deformity NEURO: no gross focal deficits; no sign of tremor   LABORATORY RESULTS: See [facility] ([facility]Epic) for most recent results   RADIOLOGY RESULTS: See [facility] (CHL-Epic) for most recent results   IMPRESSION: Hidradenitis upper teeth, severe, involving posterior neck and scalp  PLAN: I discussed hidradenitis at length with the patient and his significant other. We discussed the spectrum of the disease. We discussed treatment options.  I would like the patient to be evaluated by dermatology. I would like to know whether he would be a candidate for topical antibiotic lotions or possibly treatment with Accutane. Failing this, I think he may require definitive surgical excision with skin grafting. This may require involvement of one of the plastic surgeons.  Patient will be seen by dermatology. We will make the referral. He will return as needed.  Velora Heckler, MD, FACS General & Endocrine Surgery [facility]   Visit Diagnoses: 1. Hidradenitis suppurativa, neck and scalp     Primary Care Physician: Provider Not In System

## 2012-08-09 NOTE — Telephone Encounter (Signed)
Pts wife very concerned that pt was sent to our office to be referred out to Dermatologist. She states she was under impression surgery would be done today. I advised her we cannot do a major wide excision of this area in an office setting. She left the room stating she cannot stay any longer before she "loses it". I asked pt if he would like me to move forward with referral to Dermatologist. He asked that I wait to do this until he has time to discuss this with his wife. I advised pt the order is in system and will be able to make referral if pt decides he wants to proceed.

## 2012-08-09 NOTE — Patient Instructions (Signed)
Hidradenitis Suppurativa, Sweat Gland Abscess  Hidradenitis suppurativa is a long lasting (chronic), uncommon disease of the sweat glands. With this, boil-like lumps and scarring develop in the groin, some times under the arms (axillae), and under the breasts. It may also uncommonly occur behind the ears, in the crease of the buttocks, and around the genitals.   CAUSES   The cause is from a blocking of the sweat glands. They then become infected. It may cause drainage and odor. It is not contagious. So it cannot be given to someone else. It most often shows up in puberty (about 10 to 36 years of age). But it may happen much later. It is similar to acne which is a disease of the sweat glands. This condition is slightly more common in African-Americans and women.  SYMPTOMS    Hidradenitis usually starts as one or more red, tender, swellings in the groin or under the arms (axilla).   Over a period of hours to days the lesions get larger. They often open to the skin surface, draining clear to yellow-colored fluid.   The infected area heals with scarring.  DIAGNOSIS   Your caregiver makes this diagnosis by looking at you. Sometimes cultures (growing germs on plates in the lab) may be taken. This is to see what germ (bacterium) is causing the infection.   TREATMENT    Topical germ killing medicine applied to the skin (antibiotics) are the treatment of choice. Antibiotics taken by mouth (systemic) are sometimes needed when the condition is getting worse or is severe.   Avoid tight-fitting clothing which traps moisture in.   Dirt does not cause hidradenitis and it is not caused by poor hygiene.   Involved areas should be cleaned daily using an antibacterial soap. Some patients find that the liquid form of Lever 2000, applied to the involved areas as a lotion after bathing, can help reduce the odor related to this condition.   Sometimes surgery is needed to drain infected areas or remove scarred tissue. Removal of  large amounts of tissue is used only in severe cases.   Birth control pills may be helpful.   Oral retinoids (vitamin A derivatives) for 6 to 12 months which are effective for acne may also help this condition.   Weight loss will improve but not cure hidradenitis. It is made worse by being overweight. But the condition is not caused by being overweight.   This condition is more common in people who have had acne.   It may become worse under stress.  There is no medical cure for hidradenitis. It can be controlled, but not cured. The condition usually continues for years with periods of getting worse and getting better (remission).  Document Released: 12/18/2003 Document Revised: 07/28/2011 Document Reviewed: 01/03/2008  [employer] Patient Information 2013 [employer].

## 2012-08-11 ENCOUNTER — Telehealth (INDEPENDENT_AMBULATORY_CARE_PROVIDER_SITE_OTHER): Payer: Self-pay

## 2012-08-11 NOTE — Telephone Encounter (Signed)
I called the pt and got the wife on the phone.  I offered an appointment to come see Dr Janee Morn for evaluation and said that he is Dr Ardine Eng  partner.  The wife declined the appointment because she said they already wasted $40 to come in and be told nothing so they didn't have the $ to come in and do it again.  I told her if she changes her mind to call us back and we can offer an appointment to see Dr Janee Morn.

## 2012-09-12 ENCOUNTER — Encounter (HOSPITAL_BASED_OUTPATIENT_CLINIC_OR_DEPARTMENT_OTHER): Payer: Self-pay | Admitting: *Deleted

## 2012-09-12 ENCOUNTER — Emergency Department (HOSPITAL_BASED_OUTPATIENT_CLINIC_OR_DEPARTMENT_OTHER)
Admission: EM | Admit: 2012-09-12 | Discharge: 2012-09-12 | Disposition: A | Discharge status: Home / Self Care | Payer: BC Managed Care – PPO | Attending: Emergency Medicine | Admitting: Emergency Medicine

## 2012-09-12 DIAGNOSIS — L03221 Cellulitis of neck: Secondary | ICD-10-CM | POA: Insufficient documentation

## 2012-09-12 DIAGNOSIS — L02219 Cutaneous abscess of trunk, unspecified: Secondary | ICD-10-CM | POA: Insufficient documentation

## 2012-09-12 DIAGNOSIS — L0211 Cutaneous abscess of neck: Secondary | ICD-10-CM | POA: Insufficient documentation

## 2012-09-12 DIAGNOSIS — F17200 Nicotine dependence, unspecified, uncomplicated: Secondary | ICD-10-CM | POA: Insufficient documentation

## 2012-09-12 DIAGNOSIS — R000 Tachycardia, unspecified: Secondary | ICD-10-CM | POA: Insufficient documentation

## 2012-09-12 HISTORY — DX: Hidradenitis suppurativa: L73.2

## 2012-09-12 MED ORDER — OXYCODONE-ACETAMINOPHEN 5-325 MG PO TABS
2.0000 | ORAL_TABLET | Freq: Once | ORAL | Status: AC
  Administered 2012-09-12: 2 via ORAL
  Filled 2012-09-12 (×2): qty 2

## 2012-09-12 MED ORDER — SULFAMETHOXAZOLE-TRIMETHOPRIM 800-160 MG PO TABS: 1.0000 | ORAL_TABLET | Freq: Two times a day (BID) | ORAL | Status: DC

## 2012-09-12 NOTE — ED Notes (Signed)
Pt states he has a hx of hidradenitis suppurativa and is seeing a dermatologist for same. Has been doing so x 2 weeks. "Must do this prior to seeing a surgeon" Noticed increased redness and swelling since Wed to area on his neck.

## 2012-09-12 NOTE — ED Provider Notes (Signed)
History     CSN: [id number]  Arrival date & time 09/12/12  1430   First MD Initiated Contact with Patient 09/12/12 1537      Chief Complaint  Patient presents with  . Recurrent Skin Infections    (Consider location/radiation/quality/duration/timing/severity/associated sxs/prior treatment) HPI Albert White is a 37 y.o. male who presents to the ED with an abscess. The abscess is located on the back of his neck. The area has been there for 4 days. The area is tender and swollen. he has a hx of hidradenitis suppurativa and has been evaluated by a dermatologist and will be seeing a surgeon as well. He is here today due to pain and wants the area drained. The history was provided by the patient.   Past Medical History  Diagnosis Date  . Abscess   . Hidradenitis suppurativa     History reviewed. No pertinent past surgical history.  History reviewed. No pertinent family history.  History  Substance Use Topics  . Smoking status: Current Every Day Smoker -- 1.00 packs/day    Types: Cigarettes  . Smokeless tobacco: Not on file  . Alcohol Use: No      Review of Systems  Constitutional: Negative for fever.  HENT: Neck pain: at area of abscess back of neck.   Skin: Positive for wound.    Allergies  Review of patient's allergies indicates no known allergies.  Home Medications   Current Outpatient Rx  Name  Route  Sig  Dispense  Refill  . minocycline (MINOCIN,DYNACIN) 100 MG capsule   Oral   Take 100 mg by mouth 2 (two) times daily.           BP 147/94  Pulse 102  Temp(Src) 97.7 F (36.5 C) (Oral)  Resp 20  Ht 5\' 10"  (1.778 m)  Wt 370 lb (167.831 kg)  BMI 53.09 kg/m2  SpO2 95%  Physical Exam  Nursing note and vitals reviewed. Constitutional: He is oriented to person, place, and time. No distress.  Morbidly obese W/M who is very anxious.  HENT:  Head: Normocephalic.  Eyes: EOM are normal.  Neck: Neck supple.  Abscess to posterior aspect of neck.   Cardiovascular: Tachycardia present.   Pulmonary/Chest: Effort normal.  Musculoskeletal: Normal range of motion.  Neurological: He is alert and oriented to person, place, and time.  Skin:  Abscess posterior aspect of neck.  Psychiatric: His speech is normal. Thought content normal. His mood appears anxious. Cognition and memory are normal.    ED Course  Procedures (including critical care time) INCISION AND DRAINAGE Performed by: Gahel Safley Consent: Verbal consent obtained. Risks and benefits: risks, benefits and alternatives were discussed Type: abscess  Body area: posterior aspect of neck  Anesthesia: local infiltration  Incision was made with a scalpel.  Local anesthetic: lidocaine 2% with epinephrine  Anesthetic total: 2 ml  Complexity: complex Blunt dissection to break up loculations  Drainage: purulent  Drainage amount: moderate  After breaking up loculations and irrigating the abscess the patient states he needs a break. I ordered Percocet 2 tablets and told him we will wait to do the packing. His wife voiced that she is angry that he is being put though so much pain.   @ 16:30 the RN states that the patient and his wife are requesting that someone else do the packing. Discussed with Dr. Rosalia Hammers and will d/c the patient home without packing and have him apply warm wet compresses to help with drainage. He is to follow  up with the dermatologist as planned.   MDM  I discussed the case with Dr. Rosalia Hammers and we will no pack the area. The patient is to apply warm wet compresses and follow up with the dermatologist. Will add Septra DS to his current medications.    Medication List    TAKE these medications       sulfamethoxazole-trimethoprim 800-160 MG per tablet  Commonly known as:  SEPTRA DS  Take 1 tablet by mouth every 12 (twelve) hours.      ASK your doctor about these medications       minocycline 100 MG capsule  Commonly known as:  MINOCIN,DYNACIN  Take 100 mg by  mouth 2 (two) times daily.               [facility]ch, [place] 09/13/12 1537

## 2012-09-12 NOTE — ED Notes (Signed)
Abcess to medial posterior neck; pt denies fever or chills.

## 2012-09-12 NOTE — ED Notes (Signed)
I & D tray is at the bedside set up and ready for the doctor to use. 

## 2012-09-14 NOTE — ED Provider Notes (Signed)
History/physical exam/procedure(s) were performed by non-physician practitioner and as supervising physician I was immediately available for consultation/collaboration. I have reviewed all notes and am in agreement with care and plan.   Hilario Quarry, MD 09/14/12 [phone]

## 2013-11-16 ENCOUNTER — Emergency Department (HOSPITAL_COMMUNITY)
Admission: EM | Admit: 2013-11-16 | Discharge: 2013-11-16 | Disposition: A | Discharge status: Home / Self Care | Payer: BC Managed Care – PPO | Source: Home / Self Care

## 2013-11-16 ENCOUNTER — Encounter (HOSPITAL_COMMUNITY): Payer: Self-pay | Admitting: Emergency Medicine

## 2013-11-16 DIAGNOSIS — R42 Dizziness and giddiness: Secondary | ICD-10-CM

## 2013-11-16 DIAGNOSIS — T679XXA Effect of heat and light, unspecified, initial encounter: Secondary | ICD-10-CM

## 2013-11-16 DIAGNOSIS — X58XXXA Exposure to other specified factors, initial encounter: Secondary | ICD-10-CM

## 2013-11-16 LAB — POCT I-STAT, CHEM 8
BUN: 8 mg/dL (ref 6–23)
CALCIUM ION: 1.13 mmol/L (ref 1.12–1.23)
Chloride: 109 mEq/L (ref 96–112)
Creatinine, Ser: 0.7 mg/dL (ref 0.50–1.35)
Glucose, Bld: 104 mg/dL — ABNORMAL HIGH (ref 70–99)
HEMATOCRIT: 47 % (ref 39.0–52.0)
HEMOGLOBIN: 16 g/dL (ref 13.0–17.0)
Potassium: 3.9 mEq/L (ref 3.7–5.3)
SODIUM: 139 meq/L (ref 137–147)
TCO2: 21 mmol/L (ref 0–100)

## 2013-11-16 MED ORDER — MECLIZINE HCL 25 MG PO TABS: 25.0000 mg | ORAL_TABLET | Freq: Four times a day (QID) | ORAL | Status: DC

## 2013-11-16 NOTE — ED Notes (Signed)
Albert White  Works  Outside  In the  [employer]       Today  He  Became  Dizzy  Had  Some  Nausea  And  Vomited  X  2      - he  Is  Overweight         Pt  denys  Any  Diarrhea  Or  Any  Abdominal pain         He  Ambulated  To the  Room with a  Slow  Steady  Gait        He  Is  Awake  And  Alert  And  His  Mucous membranes  Are  Moist

## 2013-11-16 NOTE — Discharge Instructions (Signed)
Dizziness °Dizziness is a common problem. It is a feeling of unsteadiness or light-headedness. You may feel like you are about to faint. Dizziness can lead to injury if you stumble or fall. A person of any age group can suffer from dizziness, but dizziness is more common in older adults. °CAUSES  °Dizziness can be caused by many different things, including: °· Middle ear problems. °· Standing for too long. °· Infections. °· An allergic reaction. °· Aging. °· An emotional response to something, such as the sight of blood. °· Side effects of medicines. °· Tiredness. °· Problems with circulation or blood pressure. °· Excessive use of alcohol or medicines, or illegal drug use. °· Breathing too fast (hyperventilation). °· An irregular heart rhythm (arrhythmia). °· A low red blood cell count (anemia). °· Pregnancy. °· Vomiting, diarrhea, fever, or other illnesses that cause body fluid loss (dehydration). °· Diseases or conditions such as Parkinson's disease, high blood pressure (hypertension), diabetes, and thyroid problems. °· Exposure to extreme heat. °DIAGNOSIS  °Your health care provider will ask about your symptoms, perform a physical exam, and perform an electrocardiogram (ECG) to record the electrical activity of your heart. Your health care provider may also perform other heart or blood tests to determine the cause of your dizziness. These may include: °· Transthoracic echocardiogram (TTE). During echocardiography, sound waves are used to evaluate how blood flows through your heart. °· Transesophageal echocardiogram (TEE). °· Cardiac monitoring. This allows your health care provider to monitor your heart rate and rhythm in real time. °· Holter monitor. This is a portable device that records your heartbeat and can help diagnose heart arrhythmias. It allows your health care provider to track your heart activity for several days if needed. °· Stress tests by exercise or by giving medicine that makes the heart beat  faster. °TREATMENT  °Treatment of dizziness depends on the cause of your symptoms and can vary greatly. °HOME CARE INSTRUCTIONS  °· Drink enough fluids to keep your urine clear or pale yellow. This is especially important in very hot weather. In older adults, it is also important in cold weather. °· Take your medicine exactly as directed if your dizziness is caused by medicines. When taking blood pressure medicines, it is especially important to get up slowly. °¨ Rise slowly from chairs and steady yourself until you feel okay. °¨ In the morning, first sit up on the side of the bed. When you feel okay, stand slowly while holding onto something until you know your balance is fine. °· Move your legs often if you need to stand in one place for a long time. Tighten and relax your muscles in your legs while standing. °· Have someone stay with you for 1-2 days if dizziness continues to be a problem. Do this until you feel you are well enough to stay alone. Have the person call your health care provider if he or she notices changes in you that are concerning. °· Do not drive or use heavy machinery if you feel dizzy. °· Do not drink alcohol. °SEEK IMMEDIATE MEDICAL CARE IF:  °· Your dizziness or light-headedness gets worse. °· You feel nauseous or vomit. °· You have problems talking, walking, or using your arms, hands, or legs. °· You feel weak. °· You are not thinking clearly or you have trouble forming sentences. It may take a friend or family member to notice this. °· You have chest pain, abdominal pain, shortness of breath, or sweating. °· Your vision changes. °· You notice   any bleeding.  You have side effects from medicine that seems to be getting worse rather than better. MAKE SURE YOU:   Understand these instructions.  Will watch your condition.  Will get help right away if you are not doing well or get worse. Document Released: 10/29/2000 Document Revised: 05/10/2013 Document Reviewed: 11/22/2010 [facility]  Patient Information 2015 [place], [place]. This information is not intended to replace advice given to you by your health care provider. Make sure you discuss any questions you have with your health care provider.  Heat Disorders Heat related disorders are illnesses caused by continued exposure to hot and humid environments, not drinking enough fluids, and/or your body failing to regulate its temperature correctly. People suffer from heat stress and heat related disorders when their bodies are unable to compensate and cool down through sweating. With sufficient heat, sweating is not enough to keep you cool, and your body temperature can rise quickly. Very high body temperatures can damage your brain and other vital organs. High humidity (moisture in the air), adds to heat stress, because it is harder for sweat to evaporate and cool your body. Heat stress and disorders are not uncommon. Some medicines can increase your risk for heat related illness. Ask your caregiver about your medicines during periods of intense heat.  Heat related disorders include:  Heatstroke. When you cannot sweat or regulate your body temperature in an adequate way. This is very dangerous and can be life threatening. Get emergency medical help.  Heat exhaustion. Overheating causes heavy sweating and a fast heart rate. Your body can still regulate its own temperature.  Heat cramps. Painful, uncontrollable muscle spasms. Can occur during heavy exercise in hot environments.  Sunburn. Skin becomes red and painful (burned) after being out in the sun.  Heat rash. Sweat ducts become blocked, which traps sweat under the skin. This causes blisters and red bumps and may cause an itchy or tingling feeling. PREVENTING HEAT STRESS AND HEAT RELATED DISORDERS Overheating can be dangerous and life threatening. When exercising, working, or doing other activities in hot and humid environments, do the following:  Stay informed by listening to and  watching local broadcast weather and safety updates during intense heat.  Air conditioning is the best way to prevent heat disorders. If your home is not air conditioned, spend time in air conditioned places (malls, [employer], or heat shelters set up by your local health department).  Wear light-weight, light colored, loose fitting clothing. Wear as little clothing as possible when at home.  Increase your fluid intake. Drink enough water and fluids to keep your urine clear or pale yellow. DO NOT WAIT UNTIL YOU ARE THIRSTY TO DRINK. You may already be heat stressed, and not recognize it.  If your caregiver has suggested that you limit the amount of fluid you drink or has prescribed water pills for a medical problem, ask how much you should drink when the weather is hot.  Do not drink liquids with alcohol, caffeine, or lots of sugar. They can cause more loss of body fluid.  Heavy sweating drains your body's salt and minerals, which must be replaced. If you must exercise in the heat, a sports beverage can replace the salt and minerals you lose in sweat. If you are on a low-salt diet, check with your caregiver before drinking a sports beverage.  Sunburn reduces your body's ability to cool itself and causes a loss of needed body fluids. If you go outdoors, protect yourself from the sun by  wearing a wide-brimmed hat, along with sunglasses.  Put on sunscreen of SPF 15 or higher, 30 minutes before going out. (The most effective products say "broad spectrum" or "UVA/UVB protection" on the label.) Reapply sunscreen frequently -- at least every 1-2 hours.  Take added precautions when both the heat and humidity are high.  Rest often.  Even young and otherwise healthy people can become heat stressed and suffer from a heat disorder, if they participate in strenuous activities during hot weather.  If you must be outdoors, try going out only during morning and evening hours, when it is cooler. Rest  often in shady areas, so that your body's temperature can adjust.  If your heart pounds or you are gasping for breath, STOP all activity. Go immediately to a cool area, or at least into the shade, and rest. This is especially true if you become lightheaded, confused, weak, or faint.  Electric fans may make you comfortable, but they DO NOT prevent heat related problems. SYMPTOMS   Headache.  Nosebleed.  Weakness.  You feel very hot.  Muscle cramps.  Restlessness.  Fainting or dizziness.  Fast breathing and shortness of breath.  Excessive sweating. (There may be little or no sweating in late stages of heat exhaustion.)  Rapid pulse, heart pounding.  Feeling sick to your stomach (nauseous, vomiting).  Skin becoming cold and clammy, or excessively hot and dry. HOME CARE INSTRUCTIONS   Lie down and rest in a cool or air conditioned area.  Drink enough water and fluids to keep your urine clear or pale yellow. Avoid fluids with caffeine or high sugar content. Avoid coffee, tea, alcohol or stimulants.  Do not take salt tablets, unless advised by your caregiver.  Avoid hot foods and heavy meals.  Bathe or shower in cool water.  Wear minimal clothing.  Use a fan. Add cool or warm mist to the air, if possible.  If possible, decrease the use of your stove or oven at home.  Monitor adults at risk at least twice a day, watching closely for signs of heat exhaustion or heat stroke. Infants and young children also require more frequent watching.  Never leave infants, children or pets in a parked car, even if the windows are cracked open.  If you are 37 years of age or older, have a friend or relative call to check on you twice a day during a heat wave. If you know someone in this age group, check on them at least twice a day. SEEK IMMEDIATE MEDICAL CARE IF:  You have a hard time breathing.  You vomit or pass blood in your stool.  You have a seizure, feel dizzy or faint, or  pass out.  You develop severe sweating.  Your skin is red, hot and dry (there is no sweating).  Your urine turns a dark color or has blood in it.  You are making very little or no urine.  You are unable to keep fluids down.  You develop chest or abdominal pain.  You develop a throbbing headache.  You develop nausea or confusion. IF YOU OBSERVE SOMEONE WHO MIGHT HAVE HEAT STROKE This can be life threatening. Call your local emergency services (911 in the [place]  If the victim is in the sun, get him or her to a shady area.  Cool the victim rapidly, using whatever methods you have:  Place the victim in a tub of cool water or a cool shower.  Spray the victim with cool water from  a garden hose, or sponge the person with cool water.  Wrap the victim in a cool, wet sheet and fan them.  If emergency medical help is delayed, call the hospital emergency room or your local emergency services (911 in the [place] for further instructions.  Sometimes a victim's muscles will begin to twitch from heat stroke. If this happens, keep the victim from injuring himself. However, do not place any object in the mouth. Give fluids, unless the muscle twitching makes it difficult or unsafe to do so. If there is vomiting, make sure the airway remains open by turning the victim on his or her side. Document Released: 05/02/2000 Document Revised: 07/28/2011 Document Reviewed: 02/19/2009 [facility] Patient Information 2015 [place], [place]. This information is not intended to replace advice given to you by your health care provider. Make sure you discuss any questions you have with your health care provider.  Vertigo Vertigo means you feel like you or your surroundings are moving when they are not. Vertigo can be dangerous if it occurs when you are at work, driving, or performing difficult activities.  CAUSES  Vertigo occurs when there is a conflict of signals sent to your brain from the visual and sensory systems  in your body. There are many different causes of vertigo, including:  Infections, especially in the inner ear.  A bad reaction to a drug or misuse of alcohol and medicines.  Withdrawal from drugs or alcohol.  Rapidly changing positions, such as lying down or rolling over in bed.  A migraine headache.  Decreased blood flow to the brain.  Increased pressure in the brain from a head injury, infection, tumor, or bleeding. SYMPTOMS  You may feel as though the world is spinning around or you are falling to the ground. Because your balance is upset, vertigo can cause nausea and vomiting. You may have involuntary eye movements (nystagmus). DIAGNOSIS  Vertigo is usually diagnosed by physical exam. If the cause of your vertigo is unknown, your caregiver may perform imaging tests, such as an MRI scan (magnetic resonance imaging). TREATMENT  Most cases of vertigo resolve on their own, without treatment. Depending on the cause, your caregiver may prescribe certain medicines. If your vertigo is related to body position issues, your caregiver may recommend movements or procedures to correct the problem. In rare cases, if your vertigo is caused by certain inner ear problems, you may need surgery. HOME CARE INSTRUCTIONS   Follow your caregiver's instructions.  Avoid driving.  Avoid operating heavy machinery.  Avoid performing any tasks that would be dangerous to you or others during a vertigo episode.  Tell your caregiver if you notice that certain medicines seem to be causing your vertigo. Some of the medicines used to treat vertigo episodes can actually make them worse in some people. SEEK IMMEDIATE MEDICAL CARE IF:   Your medicines do not relieve your vertigo or are making it worse.  You develop problems with talking, walking, weakness, or using your arms, hands, or legs.  You develop severe headaches.  Your nausea or vomiting continues or gets worse.  You develop visual changes.  A  family member notices behavioral changes.  Your condition gets worse. MAKE SURE YOU:  Understand these instructions.  Will watch your condition.  Will get help right away if you are not doing well or get worse. Document Released: 02/12/2005 Document Revised: 07/28/2011 Document Reviewed: 11/21/2010 [facility] Patient Information 2015 [place], [place]. This information is not intended to replace advice given to you by your  health care provider. Make sure you discuss any questions you have with your health care provider. ° °

## 2013-11-16 NOTE — ED Provider Notes (Signed)
CSN: [id number]     Arrival date & time 11/16/13  1158 History   First MD Initiated Contact with Patient 11/16/13 1208     Chief Complaint  Patient presents with  . Dizziness   (Consider location/radiation/quality/duration/timing/severity/associated sxs/prior Treatment) HPI Comments: Severely obese male was working outside on a hot day and while standing developed "spinning" he describes vertigo that lasted for approximately one hour. Shortly after developing the vertigo he vomited twice. It lasted for approximately one hour. Upon presenting to the urgent care his complaint is primarily lightheadedness without vertigo. He also complained of heaviness in his chest while in route to the urgent care. He did not have this earlier during the day with exertion.   Past Medical History  Diagnosis Date  . Abscess   . Hidradenitis suppurativa    History reviewed. No pertinent past surgical history. History reviewed. No pertinent family history. History  Substance Use Topics  . Smoking status: Current Every Day Smoker -- 1.00 packs/day    Types: Cigarettes  . Smokeless tobacco: Not on file  . Alcohol Use: No    Review of Systems  Constitutional: Positive for activity change. Negative for fever.  HENT: Negative.   Eyes: Negative for visual disturbance.  Respiratory: Positive for chest tightness. Negative for cough, choking, shortness of breath, wheezing and stridor.   Cardiovascular: Negative for palpitations and leg swelling.  Gastrointestinal: Positive for vomiting. Negative for abdominal pain.  Genitourinary: Negative.   Skin: Negative.   Neurological: Positive for dizziness and light-headedness. Negative for tremors, syncope, speech difficulty and headaches.    Allergies  Review of patient's allergies indicates no known allergies.  Home Medications   Prior to Admission medications   Medication Sig Start Date End Date Taking? Authorizing Provider  minocycline (MINOCIN,DYNACIN) 100  MG capsule Take 100 mg by mouth 2 (two) times daily.    Historical Provider, MD  sulfamethoxazole-trimethoprim (SEPTRA DS) 800-160 MG per tablet Take 1 tablet by mouth every 12 (twelve) hours. 09/12/12   Hope Orlene OchM Neese, NP   BP 139/98  Pulse 84  Temp(Src) 98.7 F (37.1 C) (Oral)  Resp 18  SpO2 100% Physical Exam  Nursing note and vitals reviewed. Constitutional: He is oriented to person, place, and time. He appears well-developed. No distress.  Severely obese  HENT:  Right Ear: External ear normal.  Left Ear: External ear normal.  Mouth/Throat: Oropharynx is clear and moist. No oropharyngeal exudate.  Eyes: Conjunctivae and EOM are normal. Pupils are equal, round, and reactive to light.  Neck: Normal range of motion. Neck supple.  Cardiovascular: Normal rate, regular rhythm and normal heart sounds.   Pulmonary/Chest: Effort normal. He has no wheezes. He has no rales.  Breath sounds diminished bilaterally. Body habitus and smoking.  Musculoskeletal: Normal range of motion. He exhibits no edema and no tenderness.  Lymphadenopathy:    He has no cervical adenopathy.  Neurological: He is alert and oriented to person, place, and time. He exhibits normal muscle tone.  Skin: Skin is warm and dry.  Psychiatric: He has a normal mood and affect.    ED Course  Procedures (including critical care time) Labs Review Labs Reviewed  POCT I-STAT, CHEM 8 - Abnormal; Notable for the following:    Glucose, Bld 104 (*)    All other components within normal limits    Imaging Review No results found. EKG: NSR, no ectopy. No ST-T abnormalities.  MDM   1. Vertigo   2. Heat effect, initial encounter  Suspect sx etiology from working in heat. Also consider transient vertigo, possibly associated with inner ear disorder.  Recommend no more work today. Stay well hydrated.  Rx Antivert For worsening, reocurrance go to the ED. Same with chest discomfort.  D/C in improved condition. No pain or  vertigo.     Hayden Rasmussenavid Trysten Berti, NP 11/16/13 1254

## 2013-11-18 NOTE — ED Provider Notes (Signed)
Medical screening examination/treatment/procedure(s) were performed by non-physician practitioner and as supervising physician I was immediately available for consultation/collaboration.  Leslee Homeavid Janee Ureste, M.D.  Reuben Likesavid C Keyontae Huckeby, MD 11/18/13 [phone]

## 2013-12-11 ENCOUNTER — Emergency Department (HOSPITAL_COMMUNITY)
Admission: EM | Admit: 2013-12-11 | Discharge: 2013-12-11 | Disposition: A | Discharge status: Home / Self Care | Payer: BC Managed Care – PPO | Attending: Emergency Medicine | Admitting: Emergency Medicine

## 2013-12-11 ENCOUNTER — Encounter (HOSPITAL_COMMUNITY): Payer: Self-pay | Admitting: Emergency Medicine

## 2013-12-11 DIAGNOSIS — L089 Local infection of the skin and subcutaneous tissue, unspecified: Secondary | ICD-10-CM | POA: Insufficient documentation

## 2013-12-11 DIAGNOSIS — Z79899 Other long term (current) drug therapy: Secondary | ICD-10-CM | POA: Insufficient documentation

## 2013-12-11 DIAGNOSIS — Z87891 Personal history of nicotine dependence: Secondary | ICD-10-CM | POA: Insufficient documentation

## 2013-12-11 DIAGNOSIS — L03221 Cellulitis of neck: Secondary | ICD-10-CM

## 2013-12-11 DIAGNOSIS — L0211 Cutaneous abscess of neck: Secondary | ICD-10-CM | POA: Insufficient documentation

## 2013-12-11 MED ORDER — TRAMADOL HCL 50 MG PO TABS: 50.0000 mg | ORAL_TABLET | Freq: Four times a day (QID) | ORAL | Status: DC | PRN

## 2013-12-11 MED ORDER — TRAMADOL HCL 50 MG PO TABS
50.0000 mg | ORAL_TABLET | Freq: Once | ORAL | Status: AC
  Administered 2013-12-11: 50 mg via ORAL
  Filled 2013-12-11: qty 1

## 2013-12-11 MED ORDER — SULFAMETHOXAZOLE-TRIMETHOPRIM 800-160 MG PO TABS: 1.0000 | ORAL_TABLET | Freq: Two times a day (BID) | ORAL | Status: DC

## 2013-12-11 MED ORDER — CEPHALEXIN 500 MG PO CAPS: ORAL_CAPSULE | ORAL | Status: DC

## 2013-12-11 NOTE — ED Provider Notes (Signed)
Patient with painful area to back of neck onset 2 days ago. He denies fever. Pain is nonradiating. No difficulty swallowing. No other associated symptoms. No treatment prior to coming here. Nothing makes pain better or worse. On exam patient is alert nontoxic appearing HEENT exam oropharynx is normal. Ears normal. Eyes normal. Neck supple. Posterior neck is erythematous and tender. No fluctuance. No cervical adenopathy. Lungs no respiratory distress abdomen obese. In light of no fluctuance. Will treat as cellulitis. Plan antibiotics, analgesics, recheck  Doug SouSam Janille Draughon, MD 12/11/13 [id number]

## 2013-12-11 NOTE — ED Notes (Signed)
PA-C at bed side with US to assess wound.

## 2013-12-11 NOTE — ED Provider Notes (Signed)
CSN: [id number]     Arrival date & time 12/11/13  0732 History   First MD Initiated Contact with Patient 12/11/13 [phone]     Chief Complaint  Patient presents with  . Recurrent Skin Infections     (Consider location/radiation/quality/duration/timing/severity/associated sxs/prior Treatment) HPI Comments: Patient is a 38 year old male with history of hidradenitis suppurativa who presents today for evaluation of abscess on his neck. Patient states this has been gradually worsening over the past 2 days. He took a naproxen for the pain last night which did not improve his symptoms. Movement and palpation worsen his symptoms. Patient has not taken anything for his pain today. Patient denies fevers, chills, nausea, vomiting, abdominal pain, difficulty swallowing, shortness of breath, chest pain.   The history is provided by the patient. No language interpreter was used.    Past Medical History  Diagnosis Date  . Abscess   . Hidradenitis suppurativa    History reviewed. No pertinent past surgical history. History reviewed. No pertinent family history. History  Substance Use Topics  . Smoking status: Former Smoker -- 1.00 packs/day    Quit date: 11/27/2013  . Smokeless tobacco: Never Used  . Alcohol Use: 0.6 oz/week    1 Cans of beer per week     Comment: social    Review of Systems  Constitutional: Negative for fever and chills.  Respiratory: Negative for shortness of breath.   Cardiovascular: Negative for chest pain.  Gastrointestinal: Negative for nausea, vomiting and abdominal pain.  Musculoskeletal: Positive for neck pain.  All other systems reviewed and are negative.     Allergies  Review of patient's allergies indicates no known allergies.  Home Medications   Prior to Admission medications   Medication Sig Start Date End Date Taking? Authorizing Provider  meclizine (ANTIVERT) 25 MG tablet Take 1 tablet (25 mg total) by mouth 4 (four) times daily. Prn vertigo. 11/16/13    Hayden Rasmussenavid Mabe, NP   BP 140/87  Pulse 79  Temp(Src) 97.5 F (36.4 C) (Oral)  Resp 24  SpO2 100% Physical Exam  Nursing note and vitals reviewed. Constitutional: He is oriented to person, place, and time. He appears well-developed and well-nourished. No distress.  HENT:  Head: Normocephalic and atraumatic.  Right Ear: External ear normal.  Left Ear: External ear normal.  Nose: Nose normal.  Eyes: Conjunctivae are normal.  Neck: Normal range of motion. No tracheal deviation present.  Tenderness to palpation over posterior neck. Swelling present, mild erythema. No obvious drainable abscess.   Cardiovascular: Normal rate, regular rhythm and normal heart sounds.   Pulmonary/Chest: Effort normal and breath sounds normal. No stridor.  Abdominal: Soft. He exhibits no distension. There is no tenderness.  Musculoskeletal: Normal range of motion.  Neurological: He is alert and oriented to person, place, and time.  Skin: Skin is warm and dry. He is not diaphoretic.  Psychiatric: He has a normal mood and affect. His behavior is normal.    ED Course  Procedures (including critical care time) Labs Review Labs Reviewed - No data to display  Imaging Review No results found.   EKG Interpretation None      EMERGENCY DEPARTMENT US SOFT TISSUE INTERPRETATION "Study: Limited Ultrasound of the noted body part in comments below"  INDICATIONS: Pain and Soft tissue infection Multiple views of the body part are obtained with a multi-frequency linear probe  PERFORMED BY:  Myself  IMAGES ARCHIVED?: No  SIDE:Midline  BODY PART:Neck  FINDINGS: No abcess noted and Cellulitis present  LIMITATIONS:  Body Habitus and Emergent Procedure  INTERPRETATION:  No abcess noted and Cellulitis present  COMMENT:      MDM   Final diagnoses:  Cellulitis of neck   Patient presents to ED with cellulitis of neck. US done and no abscess noted. Patient will be given Bactrim and Keflex and will have  recheck in 2 days at [facility]. Patient was instructed to use warm compresses. Given tramadol for pain. Discussed reasons to return to ED immediately. Vital signs stable for discharge. Dr. Ethelda Chick evaluated patient and agrees with plan. Patient / Family / Caregiver informed of clinical course, understand medical decision-making process, and agree with plan.     Mora Bellman, PA-C 12/11/13 [phone]

## 2013-12-11 NOTE — ED Notes (Signed)
Declined W/C at D/C and was escorted to lobby by RN. 

## 2013-12-11 NOTE — ED Notes (Signed)
PT reports a boil behind RT ear. Pt states " I have these."

## 2013-12-11 NOTE — ED Provider Notes (Signed)
Medical screening examination/treatment/procedure(s) were conducted as a shared visit with non-physician practitioner(s) and myself.  I personally evaluated the patient during the encounter.   EKG Interpretation None       Doug SouSam Reynold Mantell, MD 12/11/13 1756

## 2013-12-11 NOTE — Discharge Instructions (Signed)
Cellulitis °Cellulitis is an infection of the skin and the tissue under the skin. The infected area is usually red and tender. This happens most often in the arms and lower legs. °HOME CARE  °· Take your antibiotic medicine as told. Finish the medicine even if you start to feel better. °· Keep the infected arm or leg raised (elevated). °· Put a warm cloth on the area up to 4 times per day. °· Only take medicines as told by your doctor. °· Keep all doctor visits as told. °GET HELP IF: °· You see red streaks on the skin coming from the infected area. °· Your red area gets bigger or turns a dark color. °· Your bone or joint under the infected area is painful after the skin heals. °· Your infection comes back in the same area or different area. °· You have a puffy (swollen) bump in the infected area. °· You have new symptoms. °· You have a fever. °GET HELP RIGHT AWAY IF:  °· You feel very sleepy. °· You throw up (vomit) or have watery poop (diarrhea). °· You feel sick and have muscle aches and pains. °MAKE SURE YOU:  °· Understand these instructions. °· Will watch your condition. °· Will get help right away if you are not doing well or get worse. °Document Released: 10/22/2007 Document Revised: 09/19/2013 Document Reviewed: 07/21/2011 °ExitCare® Patient Information ©2015 [employer]. This information is not intended to replace advice given to you by your health care provider. Make sure you discuss any questions you have with your health care provider. ° ° °Emergency Department Resource Guide °1) Find a Doctor and Pay Out of Pocket °Although you won't have to find out who is covered by your insurance plan, it is a good idea to ask around and get recommendations. You will then need to call the office and see if the doctor you have chosen will accept you as a new patient and what types of options they offer for patients who are self-pay. Some doctors offer discounts or will set up payment plans for their patients who do  not have insurance, but you will need to ask so you aren't surprised when you get to your appointment. ° °2) Contact Your Local Health Department °Not all health departments have doctors that can see patients for sick visits, but many do, so it is worth a call to see if yours does. If you don't know where your local health department is, you can check in your phone book. The CDC also has a tool to help you locate your state's health department, and many state websites also have listings of all of their local health departments. ° °3) Find a Walk-in Clinic °If your illness is not likely to be very severe or complicated, you may want to try a walk in clinic. These are popping up all over the country in pharmacies, drugstores, and shopping centers. They're usually staffed by nurse practitioners or physician assistants that have been trained to treat common illnesses and complaints. They're usually fairly quick and inexpensive. However, if you have serious medical issues or chronic medical problems, these are probably not your best option. ° °No Primary Care Doctor: °- Call [facility] at  832-8000 - they can help you locate a primary care doctor that  accepts your insurance, provides certain services, etc. °- Physician Referral Service- 1-800-533-3463 ° °Chronic Pain Problems: °Organization         Address  Phone   Notes  °[facility]  (336) 297-2271 Patients need to be referred by their primary care doctor.  ° °Medication Assistance: °Organization         Address  Phone   Notes  °[place] [facility] [address] °[place], [place] [address] (336) 641-8030 --Must be a resident of [place] °-- Must have NO insurance coverage whatsoever (no Medicaid/ Medicare, etc.) °-- The pt. MUST have a primary care doctor that directs their care regularly and follows them in the community °  °[facility]  (866) 331-1348   °[employer]  (888) 892-1162   ° °Agencies that  provide inexpensive medical care: °Organization         Address  Phone   Notes  °[facility]  (336) 832-8035   °[facility]    (336) 832-7272   °[facility] [address] °[place], [place] [address] (336) 832-4777   °[facility] [address], °[place] (336) 271-4999   °[facility]    (336) 373-0678   °[facility]    (336) 272-1050   °[facility] ° [address], [place] Phone:  (336) 832-4444, Fax:  (336) 832-4440 Hours of Operation:  9 am - 6 pm, M-F.  Also accepts Medicaid/Medicare and self-pay.  °[facility] ° [address], [place] Phone: (336) 832-3150, Fax: (336) 832-3151. Hours of Operation:  8:30 am - 5:30 pm, M-F.  Also accepts Medicaid and self-pay.  °[facility] [place] [address], [place] Phone: (336) 878-6027   °[facility] [address], [place], [place] (336)723-1848, Ext. 123 Mondays & Thursdays: 7-9 AM.  First 15 patients are seen on a first come, first serve basis. °  ° °Medicaid-accepting [place] Providers: ° °Organization         Address  Phone   Notes  °[facility] [address], [place] (336) 641-2100 Also accepts self-pay patients.  °[facility] [address], [place] ° (336) 856-9996   °[facility] [address], [place] (336) 288-8857   °[facility] [address] [place] [address], [place] (336) 299-7000   °Veita Bland [address], [place]  ° (336) 373-1557 Only accepts [place] Access Medicaid patients after they have their name applied to their card.  ° °Self-Pay (no insurance) in [place]: ° °Organization         Address  Phone   Notes  °Sickle Cell Patients, [facility] [address], [place] (336) 832-1970   °[facility] [address], [place] (336) 832-4400   °[facility] ° [address], [place] (336) 992-4800   °[facility]/Dr. Osei-Bonsu ° [address] [place] [address], [place] or [address], [place] (336) 841-8500 Phone number for both [place] and [place] locations is the same.  °[facility] [address], [place] (336) 299-0000   °[facility]  [address] [place] [address],  or [address] (336) 852-7530 °(336) 878-2260   °[facility] [address],  (336) 350-1642, phone; (336) 294-5005, fax Sees patients 1st and 3rd Saturday of every month.  Must not qualify for public or private insurance (i.e. Medicaid, Medicare,    Health Choice, Veterans' Benefits) • Household income should be no more than 200% of the poverty level •The clinic cannot treat you if you are pregnant or think you are pregnant • Sexually transmitted diseases are not treated at the clinic.  ° ° °Dental Care: °Organization         Address  Phone  Notes  °[facility] [address], [place] (336) 641-6152 Accepts children up to age 21 who are enrolled in Medicaid or [place] Health Choice; pregnant women with a Medicaid card; and children who have applied for Medicaid or [place] Health Choice, but were declined, whose parents can pay a reduced fee at time of service.  °[facility] [place]  [address], [place] (336) 641-7733 Accepts children up to age 21 who are enrolled in Medicaid or [place] Health Choice; pregnant women with a Medicaid card; and children who have applied for Medicaid or [place] Health Choice, but were declined, whose parents can pay a reduced fee at time of service.  °[facility] ° [address], [place] (336) 641-4533 Patients are seen by appointment only. Walk-ins  are not accepted. [facility] will see patients 18 years of age and older. °Monday - Tuesday (8am-5pm) °Most Wednesdays (8:30-5pm) °$30 per visit, cash only  °[facility] ° [address], [place] (336) 641-4533 Patients are seen by appointment only. Walk-ins are not accepted. [facility] will see patients 18 years of age and older. °One Wednesday Evening (Monthly: Volunteer Based).  $30 per visit, cash only  °[facility]  (919) 537-3737 for adults; Children under age 4, call Graduate Pediatric Dentistry at (919) [phone]. Children aged 4-14, please call (919) [phone] to request a pediatric application. ° Dental services are provided in all areas of dental care including fillings, crowns and bridges, complete and partial dentures, implants, gum treatment, root canals, and extractions. Preventive care is also provided. Treatment is provided to both adults and children. °Patients are selected via a lottery and there is often a waiting list. °  °[facility] [address], °[place] ° (336) 763-8833 [email or web address] °  °[facility] [address], [place], [place] (336)723-1848, Ext. 123 Second and Fourth Thursday of each month, opens at 6:30 AM; Clinic ends at 9 AM.  Patients are seen on a first-come first-served basis, and a limited number are seen during each clinic.  ° °[facility] ° [address], [place], [place] (336) 723-7904   Eligibility Requirements °You must have lived in [place], [place], or [place] counties for at least the last three months. °  You cannot be eligible for state or federal sponsored healthcare insurance, including Veterans Administration, Medicaid, or Medicare. °  You generally cannot be eligible for healthcare insurance through your employer.  °  How to apply: °Eligibility screenings are held every Tuesday and Wednesday afternoon from 1:00 pm until 4:00 pm. You do not need an appointment  for the interview!  °[facility] [address], [place], [place] 336-631-2330   °[facility]  336-342-8273   °[facility]  336-703-3100   °[facility]  336-570-6415   ° °Behavioral Health Resources in the Community: °Intensive Outpatient Programs °Organization         Address  Phone  Notes  °[facility] [address], [place], [place] 336-878-6098   °[facility] [address], [place], [place] 336-832-9800   °[facility] [address], [place], [place] ° 336-882-2125   °[facility] [address],  °[place], [place] 1-800-853-5163 or 336-641-4981   °Substance Abuse Resources °Organization         Address  Phone  Notes  °[facility]  336-882-2125   °[facility]  336-784-9470   °[facility]  336-285-9073   °[facility]  336-845-3988   °Residential & Outpatient Substance Abuse Program  1-800-659-3381   °Psychological Services °Organization         Address  Phone  Notes  °[facility]  336- 832-9600   °[facility]  336- 378-7881   °[facility] [address], [place] 1-800-853-5163 or 336-641-4981   ° °Mobile Crisis Teams °Organization         Address  Phone  Notes  °[facility], Mobile Crisis Care Unit  1-877-626-1772   °[facility] °[facility] ° [address] [place], [place] 336-834-9664   °Sharon DeEsch [address] °[place] 336-554-5454   ° °Self-Help/Support Groups °Organization         Address  Phone             Notes  °[facility] - variety of support groups  336- 373-1402 Call for more information  °Narcotics Anonymous (NA), [facility] [address], °[place]  2 meetings at this location  ° °Residential Treatment Programs °Organization         Address  Phone  Notes  °[facility] [address],    °[place]  1-866-801-8205   °[facility] ° [address], [place], [place] 704-293-8524   °[facility] [address], [place] 336-845-3988 Admissions: 8am-3pm M-F  °[facility] [address]    °[place], [place] 336-841-1104   °[facility] [address], [place], [place] 336-379-7146   °[facility] [address]  °[place], [place] 336-285-9073   °[facility] Intensive Outpatient [address], [place], [place] 336-852-3033   °[facility] [address]  °[place], [place] 1-877-615-2722 or 336-784-9470   °[facility] [address] [place], [place] 336-227-7417 Accepts Medicaid  °[facility] [address]  ° [place] 1-800-659-3381 Substance Abuse/Addiction Treatment  ° °[place] Behavioral Health Resources °Organization         Address  Phone  Notes  °[facility]  (888) 581-9988   °Julie Brannon, PhD [address] [place], [place]   (336) 349-5553 or (336) 951-0000   °[facility]   [address] °[place], [place] (336) 349-4454   °[facility] [address], [place], [place] (336) 342-8316 Insurance/Medicaid/sponsorship through [facility]  °[facility] [address]                                    [place], [place] (336) 342-8316 Therapy/tele-psych/case  °[facility] [address]  ° [place],  (336) 349-2233    °Dr. Arfeen  (336) 349-4544   °[facility] [place]  [employer] [place] [facility] 1) [address],    °2) [address], [place] °3)  [address], [place] (336) 349-3220 °(336) 342-7768 ° °(336) 342-8140   °[place] [facility] (336) 342-1394 or (336) 342-3537 (After Hours)    ° ° °

## 2013-12-13 ENCOUNTER — Encounter (HOSPITAL_COMMUNITY): Payer: Self-pay | Admitting: Emergency Medicine

## 2013-12-13 ENCOUNTER — Inpatient Hospital Stay (HOSPITAL_COMMUNITY)
Admission: EM | Admit: 2013-12-13 | Discharge: 2013-12-17 | DRG: 603 | Disposition: A | Discharge status: Home / Self Care | Payer: BC Managed Care – PPO | Attending: Internal Medicine | Admitting: Internal Medicine

## 2013-12-13 DIAGNOSIS — K219 Gastro-esophageal reflux disease without esophagitis: Secondary | ICD-10-CM | POA: Diagnosis present

## 2013-12-13 DIAGNOSIS — L0291 Cutaneous abscess, unspecified: Secondary | ICD-10-CM

## 2013-12-13 DIAGNOSIS — L0390 Cellulitis, unspecified: Secondary | ICD-10-CM

## 2013-12-13 DIAGNOSIS — L732 Hidradenitis suppurativa: Secondary | ICD-10-CM

## 2013-12-13 DIAGNOSIS — L03221 Cellulitis of neck: Secondary | ICD-10-CM

## 2013-12-13 DIAGNOSIS — Z6841 Body Mass Index (BMI) 40.0 and over, adult: Secondary | ICD-10-CM

## 2013-12-13 DIAGNOSIS — Z87891 Personal history of nicotine dependence: Secondary | ICD-10-CM

## 2013-12-13 DIAGNOSIS — F1210 Cannabis abuse, uncomplicated: Secondary | ICD-10-CM | POA: Diagnosis present

## 2013-12-13 DIAGNOSIS — L0211 Cutaneous abscess of neck: Principal | ICD-10-CM

## 2013-12-13 HISTORY — DX: Morbid (severe) obesity due to excess calories: E66.01

## 2013-12-13 HISTORY — DX: Dizziness and giddiness: R42

## 2013-12-13 MED ORDER — HYDROMORPHONE HCL PF 1 MG/ML IJ SOLN
1.0000 mg | Freq: Once | INTRAMUSCULAR | Status: AC
  Administered 2013-12-13: 1 mg via INTRAVENOUS
  Filled 2013-12-13: qty 1

## 2013-12-13 MED ORDER — ONDANSETRON HCL 4 MG/2ML IJ SOLN
4.0000 mg | Freq: Once | INTRAMUSCULAR | Status: AC
  Administered 2013-12-13: 4 mg via INTRAVENOUS
  Filled 2013-12-13: qty 2

## 2013-12-13 MED ORDER — CLINDAMYCIN PHOSPHATE 600 MG/50ML IV SOLN
600.0000 mg | Freq: Once | INTRAVENOUS | Status: AC
  Administered 2013-12-14: 600 mg via INTRAVENOUS
  Filled 2013-12-13: qty 50

## 2013-12-13 NOTE — ED Notes (Signed)
Pt was seen here two days ago for abscess to back of his neck. Was given prescriptions for antibiotics and pains med but reports pain has become more severe. Denies fever. No acute distress noted at triage.

## 2013-12-13 NOTE — ED Notes (Signed)
This RN attempted twice to gain IV access and was unsuccessful.

## 2013-12-14 ENCOUNTER — Encounter (HOSPITAL_COMMUNITY): Payer: Self-pay | Admitting: *Deleted

## 2013-12-14 DIAGNOSIS — L0211 Cutaneous abscess of neck: Secondary | ICD-10-CM

## 2013-12-14 DIAGNOSIS — L03221 Cellulitis of neck: Principal | ICD-10-CM

## 2013-12-14 DIAGNOSIS — L0390 Cellulitis, unspecified: Secondary | ICD-10-CM | POA: Diagnosis present

## 2013-12-14 DIAGNOSIS — L732 Hidradenitis suppurativa: Secondary | ICD-10-CM

## 2013-12-14 LAB — CBC
HCT: 39.2 % (ref 39.0–52.0)
HEMOGLOBIN: 13 g/dL (ref 13.0–17.0)
MCH: 29.4 pg (ref 26.0–34.0)
MCHC: 33.2 g/dL (ref 30.0–36.0)
MCV: 88.7 fL (ref 78.0–100.0)
PLATELETS: 193 10*3/uL (ref 150–400)
RBC: 4.42 MIL/uL (ref 4.22–5.81)
RDW: 12.7 % (ref 11.5–15.5)
WBC: 6.7 10*3/uL (ref 4.0–10.5)

## 2013-12-14 LAB — BASIC METABOLIC PANEL
ANION GAP: 13 (ref 5–15)
BUN: 10 mg/dL (ref 6–23)
CHLORIDE: 104 meq/L (ref 96–112)
CO2: 23 mEq/L (ref 19–32)
Calcium: 7.6 mg/dL — ABNORMAL LOW (ref 8.4–10.5)
Creatinine, Ser: 0.78 mg/dL (ref 0.50–1.35)
Glucose, Bld: 102 mg/dL — ABNORMAL HIGH (ref 70–99)
POTASSIUM: 4 meq/L (ref 3.7–5.3)
SODIUM: 140 meq/L (ref 137–147)

## 2013-12-14 LAB — HEPATIC FUNCTION PANEL
ALBUMIN: 2.9 g/dL — AB (ref 3.5–5.2)
ALK PHOS: 66 U/L (ref 39–117)
ALT: 21 U/L (ref 0–53)
AST: 19 U/L (ref 0–37)
Bilirubin, Direct: <0.2 mg/dL (ref 0.0–0.3)
TOTAL PROTEIN: 5.9 g/dL — AB (ref 6.0–8.3)
Total Bilirubin: 0.4 mg/dL (ref 0.3–1.2)

## 2013-12-14 MED ORDER — SODIUM CHLORIDE 0.9 % IV SOLN
INTRAVENOUS | Status: DC
Start: 2013-12-14 — End: 2013-12-16
  Administered 2013-12-14 – 2013-12-16 (×5): via INTRAVENOUS

## 2013-12-14 MED ORDER — ACETAMINOPHEN 325 MG PO TABS: 650.0000 mg | ORAL_TABLET | Freq: Four times a day (QID) | ORAL | Status: DC | PRN

## 2013-12-14 MED ORDER — ACETAMINOPHEN 650 MG RE SUPP: 650.0000 mg | Freq: Four times a day (QID) | RECTAL | Status: DC | PRN

## 2013-12-14 MED ORDER — HYDROMORPHONE HCL PF 1 MG/ML IJ SOLN
0.5000 mg | INTRAMUSCULAR | Status: DC | PRN
  Administered 2013-12-14 – 2013-12-17 (×17): 1 mg via INTRAVENOUS
  Filled 2013-12-14 (×18): qty 1

## 2013-12-14 MED ORDER — OXYCODONE HCL 5 MG PO TABS
5.0000 mg | ORAL_TABLET | ORAL | Status: DC | PRN
  Administered 2013-12-15 – 2013-12-17 (×7): 5 mg via ORAL
  Filled 2013-12-14 (×7): qty 1

## 2013-12-14 MED ORDER — CLINDAMYCIN PHOSPHATE 600 MG/50ML IV SOLN
600.0000 mg | Freq: Three times a day (TID) | INTRAVENOUS | Status: DC
  Administered 2013-12-14 – 2013-12-15 (×4): 600 mg via INTRAVENOUS
  Filled 2013-12-14 (×6): qty 50

## 2013-12-14 MED ORDER — ENOXAPARIN SODIUM 100 MG/ML ~~LOC~~ SOLN
90.0000 mg | Freq: Every day | SUBCUTANEOUS | Status: DC
  Administered 2013-12-14 – 2013-12-17 (×4): 90 mg via SUBCUTANEOUS
  Filled 2013-12-14 (×4): qty 1

## 2013-12-14 MED ORDER — ALUM & MAG HYDROXIDE-SIMETH 200-200-20 MG/5ML PO SUSP: 30.0000 mL | Freq: Four times a day (QID) | ORAL | Status: DC | PRN

## 2013-12-14 MED ORDER — ONDANSETRON HCL 4 MG PO TABS: 4.0000 mg | ORAL_TABLET | Freq: Four times a day (QID) | ORAL | Status: DC | PRN

## 2013-12-14 MED ORDER — PNEUMOCOCCAL VAC POLYVALENT 25 MCG/0.5ML IJ INJ
0.5000 mL | INJECTION | INTRAMUSCULAR | Status: AC
  Administered 2013-12-15: 0.5 mL via INTRAMUSCULAR
  Filled 2013-12-14: qty 0.5

## 2013-12-14 MED ORDER — ONDANSETRON HCL 4 MG/2ML IJ SOLN: 4.0000 mg | Freq: Four times a day (QID) | INTRAMUSCULAR | Status: DC | PRN

## 2013-12-14 NOTE — ED Provider Notes (Signed)
CSN: [id number]     Arrival date & time 12/13/13  1520 History   First MD Initiated Contact with Patient 12/13/13 1933     Chief Complaint  Patient presents with  . Abscess     (Consider location/radiation/quality/duration/timing/severity/associated sxs/prior Treatment) Patient is a 38 y.o. male presenting with shortness of breath.  Shortness of Breath Severity:  Moderate Onset quality:  Sudden Duration:  4 hours Timing:  Constant Progression:  Worsening Associated symptoms: no fever     Past Medical History  Diagnosis Date  . Abscess   . Hidradenitis suppurativa   . Obesity    History reviewed. No pertinent past surgical history. History reviewed. No pertinent family history. History  Substance Use Topics  . Smoking status: Former Smoker -- 1.00 packs/day    Quit date: 11/27/2013  . Smokeless tobacco: Never Used  . Alcohol Use: 0.6 oz/week    1 Cans of beer per week     Comment: social    Review of Systems  Constitutional: Negative for fever and fatigue.  Respiratory: Positive for shortness of breath.   All other systems reviewed and are negative.     Allergies  Review of patient's allergies indicates no known allergies.  Home Medications   Prior to Admission medications   Medication Sig Start Date End Date Taking? Authorizing Provider  famotidine (PEPCID) 20 MG tablet Take 20 mg by mouth 2 (two) times daily as needed for heartburn or indigestion.   Yes Historical Provider, MD  sulfamethoxazole-trimethoprim (BACTRIM DS) 800-160 MG per tablet Take 1 tablet by mouth 2 (two) times daily.   Yes Historical Provider, MD  traMADol (ULTRAM) 50 MG tablet Take 50 mg by mouth every 6 (six) hours as needed for moderate pain.   Yes Historical Provider, MD   BP 132/67  Pulse 87  Temp(Src) 97.9 F (36.6 C) (Oral)  Resp 20  Ht 5\' 11"  (1.803 m)  Wt 388 lb 0.2 oz (176 kg)  BMI 54.14 kg/m2  SpO2 95% Physical Exam  Nursing note and vitals reviewed. Constitutional: He  is oriented to person, place, and time. He appears well-developed and well-nourished.  HENT:  Head: Normocephalic.  Eyes: Pupils are equal, round, and reactive to light.  Neck: Neck supple.  Cardiovascular: Normal rate and regular rhythm.  Exam reveals no gallop and no friction rub.   No murmur heard. Pulmonary/Chest: Effort normal. No respiratory distress.  Abdominal: Soft. He exhibits no distension. There is no tenderness.  Musculoskeletal: He exhibits no edema.  Neurological: He is alert and oriented to person, place, and time.  Skin: Skin is warm.  Psychiatric: He has a normal mood and affect.    ED Course  Procedures (including critical care time) Labs Review Labs Reviewed  BASIC METABOLIC PANEL - Abnormal; Notable for the following:    Glucose, Bld 102 (*)    Calcium 7.6 (*)    All other components within normal limits  HEPATIC FUNCTION PANEL - Abnormal; Notable for the following:    Total Protein 5.9 (*)    Albumin 2.9 (*)    All other components within normal limits  MRSA PCR SCREENING  ANAEROBIC CULTURE  CULTURE, ROUTINE-ABSCESS  CBC  CBC    Imaging Review Ct Soft Tissue Neck W Contrast  12/15/2013   CLINICAL DATA:  RIGHT neck soreness, with questionable bogginess or fluctuance on exam, question deep neck abscess, history morbid obesity, hidradenitis suppurative  EXAM: CT NECK WITH CONTRAST  TECHNIQUE: Multidetector CT imaging of the neck was  performed using the standard protocol following the bolus administration of intravenous contrast. Sagittal and coronal MPR images reconstructed from axial data set.  CONTRAST:  80mL OMNIPAQUE IOHEXOL 300 MG/ML  SOLN IV  COMPARISON:  01/28/2006  FINDINGS: Few scattered motion artifacts.  Visualized intracranial structures unremarkable.  Major vascular structures appear grossly patent.  Skin thickening with associated subcutaneous edema identified at the lateral and posterior RIGHT cervical region extending to the RIGHT trapezius  muscle superiorly.  In addition, abnormal fluid collection with enhancing margins identified in the posterior lateral RIGHT cervical region, either within or immediately adjacent to RIGHT trapezius muscle compatible with a deep abscess, in greatest dimensions measuring 4.0 x 1.5 x 2.5 cm.  Edema extends to superficial margin of the splenius capitis with loss of intervening fat plane.  RIGHT splenius capitis appears mildly enlarged versus LEFT though a discrete enhancing collection is not identified within this muscle.  Enlargement of parapharyngeal tonsillar tissue.  Symmetric appearing thyroid, submandibular and parotid glands.  Numerous normal size to new borderline enlarged cervical lymph nodes identified particularly on RIGHT.  Residual skin thickening and subcutaneous infiltration identified at site of a previous subcutaneous abscess at the posterior LEFT neck.  Few beam hardening artifacts of dental origin.  No anterior cervical soft tissue process identified.  No additional mass or abnormal fluid collection.  Lung apices clear.  No acute osseous findings.  IMPRESSION: Inflammatory process in the posterior RIGHT cervical region with skin thickening, subcutaneous edema, and an enhancing fluid collection within or immediately adjacent to the RIGHT trapezius muscle consistent with an abscess 4.0 x 1.5 x 2.5 cm in size.  Edema extends to the superficial fascia of the underlying RIGHT splenius capitis muscle, which appears minimally enlarged versus LEFT, though no definite additional abnormal fluid collections are identified.   Electronically Signed   By: Ulyses SouthwardMark  Boles M.D.   On: 12/15/2013 11:24     EKG Interpretation None      MDM   Final diagnoses:  Cellulitis of neck  Hidradenitis suppurativa  Hypocalcemia  Morbid obesity  Neck abscess  Abscess   38 y/o male presents with cellulitis of his neck with no systemic symptoms. Patient has recurrent history of abscesses, was prescribed keflex and  bactrim 3 days prior and the infections continues to worsen. On exam, no area of fluctuance and no pocket of infection noted on ultrasound. Patient determined to have cellulitis that failed outpatient treatment, therefore IV antibiotics initiated and the patient was admitted for observation.     Clement SayresStaci Makyah Lavigne, MD 12/16/13 [phone]

## 2013-12-14 NOTE — Consult Note (Signed)
No surgery necessary.  Marta LamasJames O. Gae BonWyatt, III, MD, FACS [phone]--pager [phone]--office [facility]

## 2013-12-14 NOTE — H&P (Signed)
[facility] Admission History and Physical       Albert White:[id number] DOB: [birth year] DOA: 12/13/2013  Referring physician:  EDP PCP: Kaleen Mask, MD  Specialists:   Chief Complaint:  Worsening Boils and Swelling of Neck  HPI: Albert White is a 38 y.o. male with a history of Hidradenitis suppurativa who presents to the ED with complaints of increased redness, swelling and painful boils on the right side and back of his neck.   He was seen in the ED 3 days ago and prescribed Bactrim and Keflex but his symptoms worsened.    He was seen in the ED and placed on IV clindamycin and referred for medical admission.      Review of Systems:  Constitutional: No Weight Loss, No Weight Gain, Night Sweats, Fevers, Chills, Dizziness, Fatigue, or Generalized Weakness HEENT: No Headaches, Difficulty Swallowing,Tooth/Dental Problems,Sore Throat,  No Sneezing, Rhinitis, Ear Ache, Nasal Congestion, or Post Nasal Drip,  Cardio-vascular:  No Chest pain, Orthopnea, PND, Edema in Lower Extremities, Anasarca, Dizziness, Palpitations  Resp: No Dyspnea, No DOE, No Productive Cough, No Non-Productive Cough, No Hemoptysis, No Wheezing.    GI: No Heartburn, Indigestion, Abdominal Pain, Nausea, Vomiting, Diarrhea, Hematemesis, Hematochezia, Melena, Change in Bowel Habits,  Loss of Appetite  GU: No Dysuria, Change in Color of Urine, No Urgency or Frequency, No Flank pain.  Musculoskeletal: No Joint Pain or Swelling, No Decreased Range of Motion, No Back Pain.  Neurologic: No Syncope, No Seizures, Muscle Weakness, Paresthesia, Vision Disturbance or Loss, No Diplopia, No Vertigo, No Difficulty Walking,  Skin: +Rash or Lesions. Psych: No Change in Mood or Affect, No Depression or Anxiety, No Memory loss, No Confusion, or Hallucinations   Past Medical History  Diagnosis Date  . Abscess   . Hidradenitis suppurativa   . Morbid obesity       History reviewed. No pertinent past  surgical history.     Prior to Admission medications   Medication Sig Start Date End Date Taking? Authorizing Provider  famotidine (PEPCID) 20 MG tablet Take 20 mg by mouth 2 (two) times daily as needed for heartburn or indigestion.   Yes Historical Provider, MD  sulfamethoxazole-trimethoprim (BACTRIM DS) 800-160 MG per tablet Take 1 tablet by mouth 2 (two) times daily.   Yes Historical Provider, MD  traMADol (ULTRAM) 50 MG tablet Take 50 mg by mouth every 6 (six) hours as needed for moderate pain.   Yes Historical Provider, MD     No Known Allergies   Social History:  reports that he quit smoking about 2 weeks ago. He has never used smokeless tobacco. He reports that he drinks about .6 ounces of alcohol per week. He reports that he uses illicit drugs (Marijuana).     History reviewed. No pertinent family history.     Physical Exam:  GEN:  Pleasant Morbidly Obese  38 y.o. Caucasian male examined and in no acute distress; cooperative with exam Filed Vitals:   12/14/13 0015 12/14/13 0130 12/14/13 0209 12/14/13 0546  BP: 128/75 118/86 132/67 154/82  Pulse: 94 90 87 86  Temp:   97.9 F (36.6 White) 97.7 F (36.5 White)  TempSrc:   Oral Oral  Resp:   20 20  Height:   5\' 11"  (1.803 m)   Weight:   176 kg (388 lb 0.2 oz)   SpO2: 93% 92% 95% 96%   Blood pressure 154/82, pulse 86, temperature 97.7 F (36.5 White), temperature source Oral, resp. rate 20, height 5\' 11"  (  1.803 m), weight 176 kg (388 lb 0.2 oz), SpO2 96.00%. PSYCH: He is alert and oriented x4; does not appear anxious does not appear depressed; affect is normal HEENT: Normocephalic and Atraumatic, Mucous membranes pink; PERRLA; EOM intact; Fundi:  Benign;  No scleral icterus, Nares: Patent, Oropharynx: Clear, Fair Dentition, Neck:  FROM, No Cervical Lymphadenopathy nor Thyromegaly or Carotid Bruit; No JVD; Breasts:: Not examined CHEST WALL: No tenderness CHEST: Normal respiration, clear to auscultation bilaterally HEART: Regular  rate and rhythm; no murmurs rubs or gallops BACK: No kyphosis or scoliosis; No CVA tenderness ABDOMEN: Positive Bowel Sounds, Obese, Soft Non-Tender; No Masses, No Organomegaly. Rectal Exam: Not done EXTREMITIES: No Cyanosis, Clubbing, or Edema; No Ulcerations. Genitalia: not examined PULSES: 2+ and symmetric SKIN: Normal hydration no rash or ulceration CNS:  Alert and oriented x 4, No focal Deficits Vascular: pulses palpable throughout    Labs on Admission:  Basic Metabolic Panel:  Recent Labs Lab 12/14/13 0551  NA 140  K 4.0  CL 104  CO2 23  GLUCOSE 102*  BUN 10  CREATININE 0.78  CALCIUM 7.6*   Liver Function Tests: No results found for this basename: AST, ALT, ALKPHOS, BILITOT, PROT, ALBUMIN,  in the last 168 hours No results found for this basename: LIPASE, AMYLASE,  in the last 168 hours No results found for this basename: AMMONIA,  in the last 168 hours CBC:  Recent Labs Lab 12/14/13 0551  WBC 6.7  HGB 13.0  HCT 39.2  MCV 88.7  PLT 193   Cardiac Enzymes: No results found for this basename: CKTOTAL, CKMB, CKMBINDEX, TROPONINI,  in the last 168 hours  BNP (last 3 results) No results found for this basename: PROBNP,  in the last 8760 hours CBG: No results found for this basename: GLUCAP,  in the last 168 hours  Radiological Exams on Admission: No results found.      Assessment/Plan:   38 y.o. male with   Principal Problem:   1.   Cellulitis- of the Neck   IV Clindamycin    Active Problems:   2.   Hidradenitis suppurativa, neck and scalp   Hx, May need follow up with General Susrgery     3.   Hypocalcemia   Check albumin , and Correct for Calcium level    if albumin is low.        4.   Morbid obesity   Needs Weight Loss.        5.   DVT Prophylaxis   Lovenox.          Code Status:   FULL CODE Family Communication:    No Family present Disposition Plan:       Inpatient  Time spent:  4860 Minutes  Albert White,Albert White [facility] Pager [phone]   If 7AM -7PM Please Contact the Day Rounding Team MD for [facility]  If 7PM-7AM, Please Contact night-coverage  [email or web address] Password [email or web address] 12/14/2013, 7:34 AM

## 2013-12-14 NOTE — Progress Notes (Signed)
Utilization Review Completed.Albert White T7/29/2015  

## 2013-12-14 NOTE — Progress Notes (Signed)
PATIENT DETAILS Name: Albert White Age: 38 y.o. Sex: male Date of Birth: 1975/12/11 Admit Date: 12/13/2013 Admitting Physician Ron Parker, MD ZOX:[id number] Joelene Millin, MD  Subjective: Essentially unchanged, no major complaints  Assessment/Plan: Principal Problem:   Cellulitis of Neck of Right Post Neck -no fever or leukocytosis, continue with IV Clindamycin-day 1 -seen by [facility]-no need for I&D at this time  Active Problems:   Hidradenitis suppurativa, neck and scalp -chronic issue-will d/w patient regarding preventative measures -likely causing above  Hypocalcemia -no further work up needed, ionized and corrected Calcium is normal       Morbid obesity -counseled regarding importance of weight loss  Disposition: Remain inpatient  DVT Prophylaxis: Prophylactic Lovenox  Code Status: Full code  Family Communication None at bedside  Procedures:  None  CONSULTS:  general surgery  Time spent 40 minutes-which includes 50% of the time with face-to-face with patient/ family and coordinating care related to the above assessment and plan.    MEDICATIONS: Scheduled Meds: . clindamycin (CLEOCIN) IV  600 mg Intravenous 3 times per day  . enoxaparin (LOVENOX) injection  90 mg Subcutaneous Daily  . [START ON 12/15/2013] pneumococcal 23 valent vaccine  0.5 mL Intramuscular Tomorrow-1000   Continuous Infusions: . sodium chloride 100 mL/hr at 12/14/13 0245   PRN Meds:.acetaminophen, acetaminophen, alum & mag hydroxide-simeth, HYDROmorphone (DILAUDID) injection, ondansetron (ZOFRAN) IV, ondansetron, oxyCODONE  Antibiotics: Anti-infectives   Start     Dose/Rate Route Frequency Ordered Stop   12/14/13 0600  clindamycin (CLEOCIN) IVPB 600 mg     600 mg 100 mL/hr over 30 Minutes Intravenous 3 times per day 12/14/13 0154     12/13/13 2345  clindamycin (CLEOCIN) IVPB 600 mg     600 mg 100 mL/hr over 30 Minutes Intravenous  Once 12/13/13 2344 12/14/13 0153        PHYSICAL EXAM: Vital signs in last 24 hours: Filed Vitals:   12/14/13 0209 12/14/13 0546 12/14/13 0900 12/14/13 1155  BP: 132/67 154/82 133/78 111/78  Pulse: 87 86 87 79  Temp: 97.9 F (36.6 C) 97.7 F (36.5 C) 98.1 F (36.7 C) 97.9 F (36.6 C)  TempSrc: Oral Oral Oral Oral  Resp: 20 20 20 22   Height: 5\' 11"  (1.803 m)     Weight: 176 kg (388 lb 0.2 oz)     SpO2: 95% 96% 95% 95%    Weight change:  Filed Weights   12/14/13 0209  Weight: 176 kg (388 lb 0.2 oz)   Body mass index is 54.14 kg/(m^2).   Gen Exam: Awake and alert with clear speech.   Neck: Supple, No JVD.Large indurated area in the post aspect of the right neck-no fluctuance felt   Chest: B/L Clear.   CVS: S1 S2 Regular, no murmurs.  Abdomen: soft, BS +, non tender, non distended.  Extremities: no edema, lower extremities warm to touch. Neurologic: Non Focal.   Skin: No Rash.   Wounds: N/A.    Intake/Output from previous day:  Intake/Output Summary (Last 24 hours) at 12/14/13 1225 Last data filed at 12/14/13 0940  Gross per 24 hour  Intake    640 ml  Output      0 ml  Net    640 ml     LAB RESULTS: CBC  Recent Labs Lab 12/14/13 0551  WBC 6.7  HGB 13.0  HCT 39.2  PLT 193  MCV 88.7  MCH 29.4  MCHC 33.2  RDW 12.7  Chemistries   Recent Labs Lab 12/14/13 0551  NA 140  K 4.0  CL 104  CO2 23  GLUCOSE 102*  BUN 10  CREATININE 0.78  CALCIUM 7.6*    CBG: No results found for this basename: GLUCAP,  in the last 168 hours  GFR Estimated Creatinine Clearance: 206.7 ml/min (by C-G formula based on Cr of 0.78).  Coagulation profile No results found for this basename: INR, PROTIME,  in the last 168 hours  Cardiac Enzymes No results found for this basename: CK, CKMB, TROPONINI, MYOGLOBIN,  in the last 168 hours  No components found with this basename: POCBNP,  No results found for this basename: DDIMER,  in the last 72 hours No results found for this basename: HGBA1C,  in  the last 72 hours No results found for this basename: CHOL, HDL, LDLCALC, TRIG, CHOLHDL, LDLDIRECT,  in the last 72 hours No results found for this basename: TSH, T4TOTAL, FREET3, T3FREE, THYROIDAB,  in the last 72 hours No results found for this basename: VITAMINB12, FOLATE, FERRITIN, TIBC, IRON, RETICCTPCT,  in the last 72 hours No results found for this basename: LIPASE, AMYLASE,  in the last 72 hours  Urine Studies No results found for this basename: UACOL, UAPR, USPG, UPH, UTP, UGL, UKET, UBIL, UHGB, UNIT, UROB, ULEU, UEPI, UWBC, URBC, UBAC, CAST, CRYS, UCOM, BILUA,  in the last 72 hours  MICROBIOLOGY: No results found for this or any previous visit (from the past 240 hour(s)).  RADIOLOGY STUDIES/RESULTS: No results found.  Jeoffrey MassedGHIMIRE,Gal Smolinski, MD  [facility] Pager:[phone]  If 7PM-7AM, please contact night-coverage [email or web address] Password [email or web address] 12/14/2013, 12:25 PM   LOS: 1 day   **Disclaimer: This note may have been dictated with voice recognition software. Similar sounding words can inadvertently be transcribed and this note may contain transcription errors which may not have been corrected upon publication of note.**

## 2013-12-14 NOTE — Consult Note (Signed)
[facility] Surgery Consult Note  Albert White [birth year]  [id number].    Requesting MD: Dr. Sloan Leiter Chief Complaint/Reason for Consult: Neck Hidradenitis  HPI:  38 y.o. male with a history of Hidradenitis suppurativa and morbid obesity who presents to the [facility] with complaints of increased redness, swelling and painful boils on the right side and back of his neck.  Pain radiates to his face/jaw.  He has had this since age 85 on and off.  He does get the boils in his axilla and groin as well.  He has had multiple I&D procedure (in ER's and OR's) most recently 5-6 years ago.  At home he tries to prevent it by using dial soap and hot packs in the past.  He was seen in the ED on 12/08/13 and prescribed Bactrim and Keflex, but his symptoms worsened. In the ER he was started on IV clindamycin and admitted to the medicine service.  No fevers/chills, SOB/CP, N/V/D, abdominal pain.     ROS: All systems reviewed and otherwise negative except for as above  History reviewed. No pertinent family history.  Past Medical History  Diagnosis Date  . Abscess   . Hidradenitis suppurativa   . Morbid obesity     History reviewed. No pertinent past surgical history.  Social History:  reports that he quit smoking about 2 weeks ago. He has never used smokeless tobacco. He reports that he drinks about .6 ounces of alcohol per week. He reports that he uses illicit drugs (Marijuana).  Allergies: No Known Allergies  Medications Prior to Admission  Medication Sig Dispense Refill  . famotidine (PEPCID) 20 MG tablet Take 20 mg by mouth 2 (two) times daily as needed for heartburn or indigestion.      . sulfamethoxazole-trimethoprim (BACTRIM DS) 800-160 MG per tablet Take 1 tablet by mouth 2 (two) times daily.      . traMADol (ULTRAM) 50 MG tablet Take 50 mg by mouth every 6 (six) hours as needed for moderate pain.        Blood pressure 133/78, pulse 87, temperature 98.1 F (36.7 C), temperature source  Oral, resp. rate 20, height '5\' 11"'  (1.803 m), weight 388 lb 0.2 oz (176 kg), SpO2 95.00%. Physical Exam: General: pleasant, morbidly obese, WD white male who is laying in bed in NAD HEENT: head is normocephalic, atraumatic.  Sclera are noninjected.  PERRL.  Ears and nose without any masses or lesions.  Mouth is pink and moist Neck: Thick/short neck due to excessive weight/skin folds, large thickened area of skin on the posterior aspect of his neck wrapping around to the right and left side, evidence of extensive scarring with enlarged hair follicles noted.  Thick band of cellulitis with induration without evidence of fluctuance or abscess pocket.  Warm to the touch and painful.  No drainage noted or foul smell. Heart: regular, rate, and rhythm.  No obvious murmurs, gallops, or rubs noted.  Palpable pedal pulses bilaterally Lungs: CTAB, no wheezes, rhonchi, or rales noted.  Respiratory effort nonlabored Abd: Obese, soft, NT/ND, +BS, no masses, hernias, or organomegaly MS: all 4 extremities are symmetrical with no cyanosis, clubbing, or edema. Skin: warm and dry with no masses, multiple erythematous papules on the extremities in various stages of healing without drainage, no fluctuance or induration noted Psych: A&Ox3 with an appropriate affect.     Results for orders placed during the hospital encounter of 12/13/13 (from the past 48 hour(s))  BASIC METABOLIC PANEL     Status: Abnormal  Collection Time    12/14/13  5:51 AM      Result Value Ref Range   Sodium 140  137 - 147 mEq/L   Potassium 4.0  3.7 - 5.3 mEq/L   Chloride 104  96 - 112 mEq/L   CO2 23  19 - 32 mEq/L   Glucose, Bld 102 (*) 70 - 99 mg/dL   BUN 10  6 - 23 mg/dL   Creatinine, Ser 0.78  0.50 - 1.35 mg/dL   Calcium 7.6 (*) 8.4 - 10.5 mg/dL   GFR calc non Af Amer >90  >90 mL/min   GFR calc Af Amer >90  >90 mL/min   Comment: (NOTE)     The eGFR has been calculated using the CKD EPI equation.     This calculation has not been  validated in all clinical situations.     eGFR's persistently <90 mL/min signify possible Chronic Kidney     Disease.   Anion gap 13  5 - 15  CBC     Status: None   Collection Time    12/14/13  5:51 AM      Result Value Ref Range   WBC 6.7  4.0 - 10.5 K/uL   RBC 4.42  4.22 - 5.81 MIL/uL   Hemoglobin 13.0  13.0 - 17.0 g/dL   HCT 39.2  39.0 - 52.0 %   MCV 88.7  78.0 - 100.0 fL   MCH 29.4  26.0 - 34.0 pg   MCHC 33.2  30.0 - 36.0 g/dL   RDW 12.7  11.5 - 15.5 %   Platelets 193  150 - 400 K/uL  HEPATIC FUNCTION PANEL     Status: Abnormal   Collection Time    12/14/13  8:05 AM      Result Value Ref Range   Total Protein 5.9 (*) 6.0 - 8.3 g/dL   Albumin 2.9 (*) 3.5 - 5.2 g/dL   AST 19  0 - 37 U/L   ALT 21  0 - 53 U/L   Alkaline Phosphatase 66  39 - 117 U/L   Total Bilirubin 0.4  0.3 - 1.2 mg/dL   Bilirubin, Direct <0.2  0.0 - 0.3 mg/dL   Indirect Bilirubin NOT CALCULATED  0.3 - 0.9 mg/dL   No results found.    Assessment/Plan Posterior/right sided neck cellulitis without abscess Hidradenitis suppurativa  Morbid obesity  Plan: 1.  No leukocytosis.  No surgical intervention needed at this time as there is not drainable abscess identified on physical exam 2.  Discussed pathophysiology of his condition and discussed at length ways to prevent/reduce risk of re-occurrence.  Exfoliation daily, dial soap daily, Hibiclens TIW, weight loss to reduce neck size and redundant skin folds (exercise and diet), powders to prevent from excessive wetness, cleansing hair trimmers and never using razors on neck.  Hot packs can be helpful and antibiotic ointment. 3.  Would continue IV antibiotics until cellulitis is improving then send home on 7-10 days of PO antibiotics - maybe doxycycline 4.  Explained preventative measures with the patient and his wife.  Will sign off, call with questions/concerns.   Coralie Keens, [facility] Surgery 12/14/2013, 10:50 AM Pager: [phone]

## 2013-12-15 ENCOUNTER — Encounter (HOSPITAL_COMMUNITY): Payer: BC Managed Care – PPO | Admitting: Anesthesiology

## 2013-12-15 ENCOUNTER — Inpatient Hospital Stay (HOSPITAL_COMMUNITY): Payer: BC Managed Care – PPO | Admitting: Anesthesiology

## 2013-12-15 ENCOUNTER — Encounter (HOSPITAL_COMMUNITY): Payer: Self-pay | Admitting: Certified Registered Nurse Anesthetist

## 2013-12-15 ENCOUNTER — Encounter (HOSPITAL_COMMUNITY)
Admission: EM | Disposition: A | Discharge status: Home / Self Care | Payer: Self-pay | Source: Home / Self Care | Attending: Internal Medicine

## 2013-12-15 ENCOUNTER — Inpatient Hospital Stay (HOSPITAL_COMMUNITY): Payer: BC Managed Care – PPO

## 2013-12-15 DIAGNOSIS — L0390 Cellulitis, unspecified: Secondary | ICD-10-CM

## 2013-12-15 DIAGNOSIS — L0291 Cutaneous abscess, unspecified: Secondary | ICD-10-CM

## 2013-12-15 HISTORY — PX: INCISION AND DRAINAGE ABSCESS: SHX5864

## 2013-12-15 LAB — CBC
HEMATOCRIT: 42.2 % (ref 39.0–52.0)
Hemoglobin: 14.5 g/dL (ref 13.0–17.0)
MCH: 30.3 pg (ref 26.0–34.0)
MCHC: 34.4 g/dL (ref 30.0–36.0)
MCV: 88.1 fL (ref 78.0–100.0)
Platelets: 211 10*3/uL (ref 150–400)
RBC: 4.79 MIL/uL (ref 4.22–5.81)
RDW: 12.7 % (ref 11.5–15.5)
WBC: 8.1 10*3/uL (ref 4.0–10.5)

## 2013-12-15 LAB — MRSA PCR SCREENING: MRSA BY PCR: NEGATIVE

## 2013-12-15 SURGERY — INCISION AND DRAINAGE, ABSCESS
Anesthesia: General | Site: Neck | Laterality: Right

## 2013-12-15 MED ORDER — IOHEXOL 300 MG/ML  SOLN
80.0000 mL | Freq: Once | INTRAMUSCULAR | Status: AC | PRN
  Administered 2013-12-15: 80 mL via INTRAVENOUS

## 2013-12-15 MED ORDER — HYDROMORPHONE HCL PF 1 MG/ML IJ SOLN
0.2500 mg | INTRAMUSCULAR | Status: DC | PRN
  Administered 2013-12-15 (×4): 0.5 mg via INTRAVENOUS

## 2013-12-15 MED ORDER — OXYCODONE HCL 5 MG PO TABS
5.0000 mg | ORAL_TABLET | Freq: Once | ORAL | Status: AC | PRN
  Administered 2013-12-15: 5 mg via ORAL

## 2013-12-15 MED ORDER — ONDANSETRON HCL 4 MG/2ML IJ SOLN
INTRAMUSCULAR | Status: DC | PRN
  Administered 2013-12-15: 4 mg via INTRAVENOUS

## 2013-12-15 MED ORDER — ONDANSETRON HCL 4 MG/2ML IJ SOLN: 4.0000 mg | Freq: Four times a day (QID) | INTRAMUSCULAR | Status: DC | PRN

## 2013-12-15 MED ORDER — OXYCODONE HCL 5 MG PO TABS
ORAL_TABLET | ORAL | Status: AC
  Filled 2013-12-15: qty 1

## 2013-12-15 MED ORDER — FENTANYL CITRATE 0.05 MG/ML IJ SOLN
INTRAMUSCULAR | Status: AC
  Filled 2013-12-15: qty 5

## 2013-12-15 MED ORDER — LACTATED RINGERS IV SOLN
INTRAVENOUS | Status: DC | PRN
  Administered 2013-12-15: 14:00:00 via INTRAVENOUS

## 2013-12-15 MED ORDER — MIDAZOLAM HCL 2 MG/2ML IJ SOLN
INTRAMUSCULAR | Status: AC
  Filled 2013-12-15: qty 2

## 2013-12-15 MED ORDER — FENTANYL CITRATE 0.05 MG/ML IJ SOLN
INTRAMUSCULAR | Status: DC | PRN
  Administered 2013-12-15: 150 ug via INTRAVENOUS

## 2013-12-15 MED ORDER — OXYCODONE HCL 5 MG/5ML PO SOLN: 5.0000 mg | Freq: Once | ORAL | Status: AC | PRN

## 2013-12-15 MED ORDER — VANCOMYCIN HCL 10 G IV SOLR
1500.0000 mg | Freq: Two times a day (BID) | INTRAVENOUS | Status: DC
  Administered 2013-12-16 – 2013-12-17 (×3): 1500 mg via INTRAVENOUS
  Filled 2013-12-15 (×4): qty 1500

## 2013-12-15 MED ORDER — MIDAZOLAM HCL 5 MG/5ML IJ SOLN
INTRAMUSCULAR | Status: DC | PRN
  Administered 2013-12-15: 2 mg via INTRAVENOUS

## 2013-12-15 MED ORDER — HYDROMORPHONE HCL PF 1 MG/ML IJ SOLN
0.5000 mg | INTRAMUSCULAR | Status: AC | PRN
  Administered 2013-12-15 (×4): 0.5 mg via INTRAVENOUS

## 2013-12-15 MED ORDER — VANCOMYCIN HCL 10 G IV SOLR
1500.0000 mg | INTRAVENOUS | Status: AC
  Administered 2013-12-15: 1500 mg via INTRAVENOUS
  Filled 2013-12-15: qty 1500

## 2013-12-15 MED ORDER — HYDROMORPHONE HCL PF 1 MG/ML IJ SOLN
INTRAMUSCULAR | Status: AC
  Filled 2013-12-15: qty 1

## 2013-12-15 MED ORDER — SUCCINYLCHOLINE CHLORIDE 20 MG/ML IJ SOLN
INTRAMUSCULAR | Status: DC | PRN
Start: 2013-12-15 — End: 2013-12-15
  Administered 2013-12-15: 100 mg via INTRAVENOUS

## 2013-12-15 MED ORDER — LIDOCAINE HCL (CARDIAC) 20 MG/ML IV SOLN
INTRAVENOUS | Status: AC
  Filled 2013-12-15: qty 5

## 2013-12-15 MED ORDER — PROPOFOL 10 MG/ML IV BOLUS
INTRAVENOUS | Status: AC
  Filled 2013-12-15: qty 20

## 2013-12-15 MED ORDER — PROPOFOL 10 MG/ML IV BOLUS
INTRAVENOUS | Status: DC | PRN
  Administered 2013-12-15: 30 mg via INTRAVENOUS
  Administered 2013-12-15: 100 mg via INTRAVENOUS
  Administered 2013-12-15: 200 mg via INTRAVENOUS

## 2013-12-15 MED ORDER — ROCURONIUM BROMIDE 50 MG/5ML IV SOLN
INTRAVENOUS | Status: AC
  Filled 2013-12-15: qty 1

## 2013-12-15 MED ORDER — ONDANSETRON HCL 4 MG/2ML IJ SOLN
INTRAMUSCULAR | Status: AC
  Filled 2013-12-15: qty 2

## 2013-12-15 MED ORDER — HYDROMORPHONE HCL PF 1 MG/ML IJ SOLN
INTRAMUSCULAR | Status: AC
  Filled 2013-12-15: qty 2

## 2013-12-15 MED ORDER — 0.9 % SODIUM CHLORIDE (POUR BTL) OPTIME
TOPICAL | Status: DC | PRN
  Administered 2013-12-15: 1000 mL

## 2013-12-15 MED ORDER — LIDOCAINE HCL (CARDIAC) 20 MG/ML IV SOLN
INTRAVENOUS | Status: DC | PRN
  Administered 2013-12-15: 60 mg via INTRAVENOUS

## 2013-12-15 SURGICAL SUPPLY — 34 items
BANDAGE GAUZE ELAST BULKY 4 IN (GAUZE/BANDAGES/DRESSINGS) IMPLANT
CANISTER SUCTION 2500CC (MISCELLANEOUS) ×2 IMPLANT
COVER SURGICAL LIGHT HANDLE (MISCELLANEOUS) ×2 IMPLANT
DRAPE LAPAROSCOPIC ABDOMINAL (DRAPES) ×2 IMPLANT
DRAPE UTILITY 15X26 W/TAPE STR (DRAPE) ×4 IMPLANT
DRSG PAD ABDOMINAL 8X10 ST (GAUZE/BANDAGES/DRESSINGS) ×2 IMPLANT
ELECT CAUTERY BLADE 6.4 (BLADE) ×2 IMPLANT
ELECT REM PT RETURN 9FT ADLT (ELECTROSURGICAL) ×2
ELECTRODE REM PT RTRN 9FT ADLT (ELECTROSURGICAL) ×1 IMPLANT
GAUZE PACKING IODOFORM 2 (PACKING) ×2 IMPLANT
GAUZE SPONGE 2X2 8PLY STRL LF (GAUZE/BANDAGES/DRESSINGS) ×1 IMPLANT
GLOVE BIO SURGEON STRL SZ7.5 (GLOVE) ×2 IMPLANT
GLOVE BIOGEL PI IND STRL 7.0 (GLOVE) ×1 IMPLANT
GLOVE BIOGEL PI IND STRL 7.5 (GLOVE) ×1 IMPLANT
GLOVE BIOGEL PI INDICATOR 7.0 (GLOVE) ×1
GLOVE BIOGEL PI INDICATOR 7.5 (GLOVE) ×1
GOWN STRL REUS W/ TWL LRG LVL3 (GOWN DISPOSABLE) ×2 IMPLANT
GOWN STRL REUS W/TWL LRG LVL3 (GOWN DISPOSABLE) ×2
KIT BASIN OR (CUSTOM PROCEDURE TRAY) ×2 IMPLANT
KIT ROOM TURNOVER OR (KITS) ×2 IMPLANT
NEEDLE 18GX1X1/2 (RX/OR ONLY) (NEEDLE) ×2 IMPLANT
NS IRRIG 1000ML POUR BTL (IV SOLUTION) ×2 IMPLANT
PACK GENERAL/GYN (CUSTOM PROCEDURE TRAY) ×2 IMPLANT
PAD ABD 8X10 STRL (GAUZE/BANDAGES/DRESSINGS) ×2 IMPLANT
PAD ARMBOARD 7.5X6 YLW CONV (MISCELLANEOUS) ×2 IMPLANT
SPONGE GAUZE 2X2 STER 10/PKG (GAUZE/BANDAGES/DRESSINGS) ×1
SPONGE GAUZE 4X4 12PLY (GAUZE/BANDAGES/DRESSINGS) ×2 IMPLANT
SWAB COLLECTION DEVICE MRSA (MISCELLANEOUS) IMPLANT
SWAB CULTURE LIQUID MINI MALE (MISCELLANEOUS) ×2 IMPLANT
SYRINGE 12CC LL (MISCELLANEOUS) ×2 IMPLANT
TAPE CLOTH SURG 4X10 WHT LF (GAUZE/BANDAGES/DRESSINGS) ×2 IMPLANT
TOWEL OR 17X24 6PK STRL BLUE (TOWEL DISPOSABLE) ×2 IMPLANT
TOWEL OR 17X26 10 PK STRL BLUE (TOWEL DISPOSABLE) ×2 IMPLANT
TUBE ANAEROBIC SPECIMEN COL (MISCELLANEOUS) ×2 IMPLANT

## 2013-12-15 NOTE — Anesthesia Preprocedure Evaluation (Addendum)
Anesthesia Evaluation  Patient identified by MRN, date of birth, ID band Patient awake    Reviewed: Allergy & Precautions, H&P , NPO status , Patient's Chart, lab work & pertinent test results  Airway Mallampati: III TM Distance: >3 FB Neck ROM: Limited    Dental   Pulmonary former smoker,          Cardiovascular negative cardio ROS  Rhythm:Regular     Neuro/Psych    GI/Hepatic GERD-  Medicated and Controlled,  Endo/Other  Morbid obesity  Renal/GU      Musculoskeletal   Abdominal (+) + obese,   Peds  Hematology   Anesthesia Other Findings   Reproductive/Obstetrics                          Anesthesia Physical Anesthesia Plan  ASA: II  Anesthesia Plan: General   Post-op Pain Management:    Induction: Intravenous  Airway Management Planned: Oral ETT and Video Laryngoscope Planned  Additional Equipment:   Intra-op Plan:   Post-operative Plan: Extubation in OR  Informed Consent: I have reviewed the patients History and Physical, chart, labs and discussed the procedure including the risks, benefits and alternatives for the proposed anesthesia with the patient or authorized representative who has indicated his/her understanding and acceptance.   Dental advisory given  Plan Discussed with: Anesthesiologist and Surgeon  Anesthesia Plan Comments:        Anesthesia Quick Evaluation

## 2013-12-15 NOTE — Transfer of Care (Signed)
Immediate Anesthesia Transfer of Care Note  Patient: Albert White  Procedure(s) Performed: Procedure(s): INCISION AND DRAINAGE Right Posterior neck ABSCESS (Right)  Patient Location: PACU  Anesthesia Type:General  Level of Consciousness: awake, alert  and oriented  Airway & Oxygen Therapy: Patient Spontanous Breathing  Post-op Assessment: Report given to PACU RN  Post vital signs: Reviewed and stable  Complications: No apparent anesthesia complications

## 2013-12-15 NOTE — Progress Notes (Signed)
[facility] Progress Note    Subjective: Patient still very sore in his neck.    Objective: Vital signs in last 24 hours: Temp:  [97.2 F (36.2 C)-98.5 F (36.9 C)] 98.3 F (36.8 C) (07/30 0550) Pulse Rate:  [79-92] 85 (07/30 0550) Resp:  [17-22] 17 (07/30 0550) BP: (111-152)/(78-90) 132/82 mmHg (07/30 0550) SpO2:  [95 %-98 %] 98 % (07/30 0550) Last BM Date: 12/14/13  Intake/Output from previous day: 07/29 0701 - 07/30 0700 In: 3605 [P.O.:1080; I.V.:2375; IV Piggyback:150] Out: -  Intake/Output this shift:    General: No acute distress, but still very sore in neck.  Lungs: Clear  Abd: Benign  Extremities: no changes  Neuro: Intact.  Neck.  Anteriorly on the right there appears to be some bogginess and ???fluctuance.  Would consider getting CT of the soft tissue of the neck.  No imaging has been done this admission that I can find.  Lab Results:  @LABLAST2 (wbc:2,hgb:2,hct:2,plt:2) BMET  Recent Labs  12/14/13 0551  NA 140  K 4.0  CL 104  CO2 23  GLUCOSE 102*  BUN 10  CREATININE 0.78  CALCIUM 7.6*   PT/INR No results found for this basename: LABPROT, INR,  in the last 72 hours ABG No results found for this basename: PHART, PCO2, PO2, HCO3,  in the last 72 hours  Studies/Results: No results found.  Anti-infectives: Anti-infectives   Start     Dose/Rate Route Frequency Ordered Stop   12/14/13 0600  clindamycin (CLEOCIN) IVPB 600 mg     600 mg 100 mL/hr over 30 Minutes Intravenous 3 times per day 12/14/13 0154     12/13/13 2345  clindamycin (CLEOCIN) IVPB 600 mg     600 mg 100 mL/hr over 30 Minutes Intravenous  Once 12/13/13 2344 12/14/13 0153      Assessment/Plan: s/p  Possible deep neck abscess. CT of the soft tissue of the neck.  LOS: 2 days   Marta LamasJames O. Gae BonWyatt, III, MD, FACS [phone]--pager [phone]--office [facility] 12/15/2013

## 2013-12-15 NOTE — Progress Notes (Signed)
PATIENT DETAILS Name: Albert White Age: 38 y.o. Sex: male Date of Birth: [birth year] Admit Date: 12/13/2013 Admitting Physician Ron ParkerHarvette C Jenkins, MD DGL:OVFIEP,PIRJJOPCP:ELKINS,WILSON Joelene MillinLIVER, MD  Subjective: Still complaining of pain and swelling in occiput and neck. Pain is same as yesterday, doesn't feel that he has improved any since yesterday. CT of neck performed.   Assessment/Plan: Principal Problem:   Cellulitis Active Problems:   Hidradenitis suppurativa, neck and scalp   Hypocalcemia   Morbid obesity   Abscess  Principal Problem:   Cellulitis with abscess of Neck of Right Post Neck  -no fever or leukocytosis, continue with IV Clindamycin  -seen by [facility]-CT Neck ordered which showed an enhancing fluid collection within or immediately adjacent to the RIGHT trapezius, [facility]CS planning on I&D  Active Problems:  Hidradenitis suppurativa, neck and scalp  -chronic issue-will d/w patient regarding preventative measures  -likely causing above   Hypocalcemia  -no further work up needed, ionized and corrected Calcium is normal   Morbid obesity  -counseled regarding importance of weight loss  Disposition: Remain inpatient  DVT Prophylaxis: Prophylactic Lovenox   Code Status: Full code  Family Communication None at bedside  Procedures:  None  CONSULTS:  general surgery   MEDICATIONS: Scheduled Meds: . clindamycin (CLEOCIN) IV  600 mg Intravenous 3 times per day  . enoxaparin (LOVENOX) injection  90 mg Subcutaneous Daily   Continuous Infusions: . sodium chloride 100 mL/hr at 12/15/13 0048   PRN Meds:.acetaminophen, acetaminophen, alum & mag hydroxide-simeth, HYDROmorphone (DILAUDID) injection, ondansetron (ZOFRAN) IV, ondansetron, oxyCODONE  Antibiotics: Anti-infectives   Start     Dose/Rate Route Frequency Ordered Stop   12/14/13 0600  clindamycin (CLEOCIN) IVPB 600 mg     600 mg 100 mL/hr over 30 Minutes Intravenous 3 times per day 12/14/13 0154     12/13/13 2345  clindamycin (CLEOCIN) IVPB 600 mg     600 mg 100 mL/hr over 30 Minutes Intravenous  Once 12/13/13 2344 12/14/13 0153       PHYSICAL EXAM: Vital signs in last 24 hours: Filed Vitals:   12/14/13 1651 12/14/13 2208 12/15/13 0143 12/15/13 0550  BP: 128/84 152/82 136/90 132/82  Pulse: 85 92 87 85  Temp: 97.2 F (36.2 C) 98.5 F (36.9 C) 98.4 F (36.9 C) 98.3 F (36.8 C)  TempSrc: Oral Oral Oral Oral  Resp: 20 18 17 17   Height:      Weight:      SpO2: 96% 97% 95% 98%    Weight change:  Filed Weights   12/14/13 0209  Weight: 176 kg (388 lb 0.2 oz)   Body mass index is 54.14 kg/(m^2).   Gen Exam: Awake and alert with clear speech.   Neck: Significant swelling and redness of occiput and right lateral neck. Exquisitely tender to touch Chest: B/L Clear.   CVS: S1 S2 Regular, no murmurs.  Abdomen: soft, BS +, non tender, non distended.  Extremities: no edema, lower extremities warm to touch. Neurologic: Non Focal.   Skin: No Rash.    LAB RESULTS: Chemistries   Recent Labs Lab 12/14/13 0551  NA 140  K 4.0  CL 104  CO2 23  GLUCOSE 102*  BUN 10  CREATININE 0.78  CALCIUM 7.6*    Storm, Karie KirksJoshua O, Merrilyn Puma-S, [employer]  [facility] Pager:[phone]  If 7PM-7AM, please contact night-coverage [email or web address] Password [email or web address] 12/15/2013, 11:07 AM   LOS: 2 days   **Disclaimer: This note may have been dictated with  voice recognition software. Similar sounding words can inadvertently be transcribed and this note may contain transcription errors which may not have been corrected upon publication of note.**

## 2013-12-15 NOTE — Op Note (Signed)
OPERATIVE REPORT  DATE OF OPERATION:  12/15/2013  PATIENT:  Albert White  38 y.o. male  PRE-OPERATIVE DIAGNOSIS:  Right posterior neck abscess  POST-OPERATIVE DIAGNOSIS:  right posterior neck abscess  PROCEDURE:  Procedure(s): INCISION AND DRAINAGE Right Posterior neck ABSCESS  SURGEON:  Surgeon(s): Cherylynn RidgesJames O Piera Downs, MD  ASSISTANT: None  ANESTHESIA:   general  EBL: <10 ml  BLOOD ADMINISTERED: none  DRAINS: none   SPECIMEN:  Source of Specimen:  right neck abscess for microbiology  COUNTS CORRECT:  YES  PROCEDURE DETAILS: The patient was taken to the operating room and placed on the table in the supine position initially. After an adequate general endotracheal anesthetic was administered he was placed in the left lateral decubitus position with a beanbag to hold him in place and multiple large pieces of adhesive tape.  A proper timeout was performed identifying the patient and the procedure to be performed. This right neck was prepped and draped in usual sterile manner. Using an aspirating syringe and an 18-gauge needle we were able to aspirate the deep neck abscess on the right side,  returning mucopurulent fluid which was sent for aerobic and anaerobic cultures.  At the same site of the aspiration, a 15 blade was used in order to make an incision initially approximately 1 cm long. A hemostat clamp was used to dissect down to the deep pocket of pus. Approximately 5 cc of greenish white mucopurulent fluid was drained. We subsequently opened up the wound slightly more using #15 blade and up to a size to accommodate the surgeon's finger (approximately 2.5cm). We were able to palpate into the cavity which was approximately 4-5 cm in maximal diameter.  We washed his cavity out with saline and then subsequently packed in with approximately 2 feet of half-inch iodoform gauze. A sterile dressing was applied. All counts were correct.  PATIENT DISPOSITION:  PACU - hemodynamically  stable.   Cherylynn RidgesWYATT, [name] 7/30/20153:35 PM

## 2013-12-15 NOTE — Progress Notes (Signed)
Per RN on floor patient ate breakfast at 08:30 will delay start until 14:30. [place]

## 2013-12-15 NOTE — Progress Notes (Signed)
Per CT the patient has a large right posterior cervical neck abscess.  Will take him to the OR for drainage.  Marta LamasJames O. Gae BonWyatt, III, MD, FACS [phone]--pager [phone]--[facility]

## 2013-12-15 NOTE — Progress Notes (Signed)
ANTIBIOTIC CONSULT NOTE - INITIAL  Pharmacy Consult for Vancomycin Indication: Wound infection  No Known Allergies  Patient Measurements: Height: 5\' 11"  (180.3 cm) Weight: 388 lb 0.2 oz (176 kg) IBW/kg (Calculated) : 75.3  Vital Signs: Temp: 97.5 F (36.4 C) (07/30 1730) Temp src: Oral (07/30 0550) BP: 157/77 mmHg (07/30 1730) Pulse Rate: 90 (07/30 1730) Intake/Output from previous day: 07/29 0701 - 07/30 0700 In: 3605 [P.O.:1080; I.V.:2375; IV Piggyback:150] Out: -  Intake/Output from this shift: Total I/O In: 1185 [P.O.:360; I.V.:825] Out: -   Labs:  Recent Labs  12/14/13 0551 12/15/13 0645  WBC 6.7 8.1  HGB 13.0 14.5  PLT 193 211  CREATININE 0.78  --    Estimated Creatinine Clearance: 206.7 ml/min (by C-G formula based on Cr of 0.78). No results found for this basename: VANCOTROUGH, Leodis BinetVANCOPEAK, VANCORANDOM, GENTTROUGH, GENTPEAK, GENTRANDOM, TOBRATROUGH, TOBRAPEAK, TOBRARND, AMIKACINPEAK, AMIKACINTROU, AMIKACIN,  in the last 72 hours   Microbiology: Recent Results (from the past 720 hour(s))  MRSA PCR SCREENING     Status: None   Collection Time    12/15/13  1:27 PM      Result Value Ref Range Status   MRSA by PCR NEGATIVE  NEGATIVE Final   Comment:            The GeneXpert MRSA Assay (FDA     approved for NASAL specimens     only), is one component of a     comprehensive MRSA colonization     surveillance program. It is not     intended to diagnose MRSA     infection nor to guide or     monitor treatment for     MRSA infections.    Medical History: Past Medical History  Diagnosis Date  . Abscess   . Hidradenitis suppurativa   . Morbid obesity   . Vertigo     Assessment: 38 YOM with worsening boils on right side and back of his neck, treated with bactrim and keflex and IV clindamycin with no significant improvement. S/p I&D today, pharmacy is consulted to start vancomycin. Noted pt. Received pre-op dose vancomycin 1500 mg at 1515. He is afebrile,  wbc and renal function are wnl.   Clindamycin 7/29 >>7/30 Vancomycin 7/30 >>  7/30 abscess cx -   Goal of Therapy:  Vancomycin trough level 10-15 mcg/ml  Plan:  Vancomycin 1500 mg IV Q 12 hrs next dose 0300  F/u renal function and cultures Vancomycin trough at steady state if continues > 72 hrs  Bayard HuggerMei Laketia Vicknair, PharmD, BCPS  Clinical Pharmacist  Pager: [phone]   12/15/2013,5:47 PM

## 2013-12-16 NOTE — Progress Notes (Signed)
Packing out tomorrow, repack, go home.  Albert LamasJames O. Gae BonWyatt, III, MD, FACS [phone]--pager [phone]--office [facility]

## 2013-12-16 NOTE — Anesthesia Postprocedure Evaluation (Signed)
  Anesthesia Post-op Note  Patient: Albert White  Procedure(s) Performed: Procedure(s): INCISION AND DRAINAGE Right Posterior neck ABSCESS (Right)  Patient Location: PACU  Anesthesia Type:General  Level of Consciousness: awake and alert   Airway and Oxygen Therapy: Patient Spontanous Breathing  Post-op Pain: mild  Post-op Assessment: Post-op Vital signs reviewed, Patient's Cardiovascular Status Stable and Respiratory Function Stable  Post-op Vital Signs: Reviewed  Filed Vitals:   12/16/13 0607  BP: 135/79  Pulse: 95  Temp: 37.2 C  Resp: 15    Complications: No apparent anesthesia complications

## 2013-12-16 NOTE — Progress Notes (Signed)
[facility] Progress Note  1 Day Post-Op  Subjective: Pt doing a bit better, but quite sore.  WBC normal.  Tolerating diet.  Feels less sick.  Objective: Vital signs in last 24 hours: Temp:  [97.5 F (36.4 C)-98.9 F (37.2 C)] 98.3 F (36.8 C) (07/31 0951) Pulse Rate:  [86-100] 95 (07/31 0951) Resp:  [10-17] 16 (07/31 0951) BP: (112-182)/(67-90) 121/75 mmHg (07/31 0951) SpO2:  [92 %-99 %] 93 % (07/31 0951) Last BM Date: 12/15/13  Intake/Output from previous day: 07/30 0701 - 07/31 0700 In: 2858.3 [P.O.:360; I.V.:1998.3; IV Piggyback:500] Out: -  Intake/Output this shift: Total I/O In: 250 [P.O.:250] Out: -    PE: Gen:  Alert, NAD, pleasant Thick/short neck due to excessive weight/skin folds, large thickened area of skin on the posterior aspect of his neck wrapping around to the right and left side, evidence of extensive scarring with enlarged hair follicles noted. Thick band of cellulitis with induration s/p incision/drainage.  Warm to the touch and painful. Some purulent drainage noted on outer covering.   Lab Results:   Recent Labs  12/14/13 0551 12/15/13 0645  WBC 6.7 8.1  HGB 13.0 14.5  HCT 39.2 42.2  PLT 193 211   BMET  Recent Labs  12/14/13 0551  NA 140  K 4.0  CL 104  CO2 23  GLUCOSE 102*  BUN 10  CREATININE 0.78  CALCIUM 7.6*   PT/INR No results found for this basename: LABPROT, INR,  in the last 72 hours CMP     Component Value Date/Time   NA 140 12/14/2013 0551   K 4.0 12/14/2013 0551   CL 104 12/14/2013 0551   CO2 23 12/14/2013 0551   GLUCOSE 102* 12/14/2013 0551   BUN 10 12/14/2013 0551   CREATININE 0.78 12/14/2013 0551   CALCIUM 7.6* 12/14/2013 0551   PROT 5.9* 12/14/2013 0805   ALBUMIN 2.9* 12/14/2013 0805   AST 19 12/14/2013 0805   ALT 21 12/14/2013 0805   ALKPHOS 66 12/14/2013 0805   BILITOT 0.4 12/14/2013 0805   GFRNONAA >90 12/14/2013 0551   GFRAA >90 12/14/2013 0551   Lipase  No results found for this basename: lipase        Studies/Results: Ct Soft Tissue Neck W Contrast  12/15/2013   CLINICAL DATA:  RIGHT neck soreness, with questionable bogginess or fluctuance on exam, question deep neck abscess, history morbid obesity, hidradenitis suppurative  EXAM: CT NECK WITH CONTRAST  TECHNIQUE: Multidetector CT imaging of the neck was performed using the standard protocol following the bolus administration of intravenous contrast. Sagittal and coronal MPR images reconstructed from axial data set.  CONTRAST:  80mL OMNIPAQUE IOHEXOL 300 MG/ML  SOLN IV  COMPARISON:  01/28/2006  FINDINGS: Few scattered motion artifacts.  Visualized intracranial structures unremarkable.  Major vascular structures appear grossly patent.  Skin thickening with associated subcutaneous edema identified at the lateral and posterior RIGHT cervical region extending to the RIGHT trapezius muscle superiorly.  In addition, abnormal fluid collection with enhancing margins identified in the posterior lateral RIGHT cervical region, either within or immediately adjacent to RIGHT trapezius muscle compatible with a deep abscess, in greatest dimensions measuring 4.0 x 1.5 x 2.5 cm.  Edema extends to superficial margin of the splenius capitis with loss of intervening fat plane.  RIGHT splenius capitis appears mildly enlarged versus LEFT though a discrete enhancing collection is not identified within this muscle.  Enlargement of parapharyngeal tonsillar tissue.  Symmetric appearing thyroid, submandibular and parotid glands.  Numerous  normal size to new borderline enlarged cervical lymph nodes identified particularly on RIGHT.  Residual skin thickening and subcutaneous infiltration identified at site of a previous subcutaneous abscess at the posterior LEFT neck.  Few beam hardening artifacts of dental origin.  No anterior cervical soft tissue process identified.  No additional mass or abnormal fluid collection.  Lung apices clear.  No acute osseous findings.  IMPRESSION:  Inflammatory process in the posterior RIGHT cervical region with skin thickening, subcutaneous edema, and an enhancing fluid collection within or immediately adjacent to the RIGHT trapezius muscle consistent with an abscess 4.0 x 1.5 x 2.5 cm in size.  Edema extends to the superficial fascia of the underlying RIGHT splenius capitis muscle, which appears minimally enlarged versus LEFT, though no definite additional abnormal fluid collections are identified.   Electronically Signed   By: Ulyses Southward M.D.   On: 12/15/2013 11:24    Anti-infectives: Anti-infectives   Start     Dose/Rate Route Frequency Ordered Stop   12/16/13 0600  [MAR Hold]  vancomycin (VANCOCIN) 1,500 mg in sodium chloride 0.9 % 500 mL IVPB     (On MAR Hold since 12/15/13 1354)   1,500 mg 250 mL/hr over 120 Minutes Intravenous On call to O.R. 12/15/13 1314 12/15/13 1615   12/16/13 0200  vancomycin (VANCOCIN) 1,500 mg in sodium chloride 0.9 % 500 mL IVPB     1,500 mg 250 mL/hr over 120 Minutes Intravenous Every 12 hours 12/15/13 1757     12/14/13 0600  [MAR Hold]  clindamycin (CLEOCIN) IVPB 600 mg  Status:  Discontinued     (On MAR Hold since 12/15/13 1354)   600 mg 100 mL/hr over 30 Minutes Intravenous 3 times per day 12/14/13 0154 12/15/13 1702   12/13/13 2345  clindamycin (CLEOCIN) IVPB 600 mg     600 mg 100 mL/hr over 30 Minutes Intravenous  Once 12/13/13 2344 12/14/13 0153       Assessment/Plan Posterior/right sided neck cellulitis with abscess s/p I&D and packing by Dr. Lindie Spruce on 12/16/13 Hidradenitis suppurativa - chronic Morbid obesity  Plan: 1.  Will take packing out tomorrow and may or may not need re-packing.  Will discuss this with Dr. Lindie Spruce.  He may be able to go home tomorrow on oral antibiotics if induration and pain improved. 2.  Patient understands preventative measure to take in the future    LOS: 3 days    [place], [facility] 12/16/2013, 12:34 PM Pager: [phone]

## 2013-12-16 NOTE — Progress Notes (Signed)
PATIENT DETAILS Name: Albert White Age: 38 y.o. Sex: male Date of Birth: 1975/07/22 Admit Date: 12/13/2013 Admitting Physician Ron Parker, MD WUJ:[id number],NWGNFA Joelene Millin, MD  Subjective: Had surgery this morning. Resting comfortably. Still some pain but states pressure previously felt has been relieved since surgery was performed.   Assessment/Plan: Principal Problem:   Cellulitis Active Problems:   Hidradenitis suppurativa, neck and scalp   Hypocalcemia   Morbid obesity   Abscess  Principal Problem:  Cellulitis with abscess of Neck of Right Post Neck  -seen by [facility]-CT Neck ordered which showed an enhancing fluid collection within or immediately adjacent to the RIGHT trapezius on 7/30. Underwent I&D on 7/30. Awaiting intraoperative cultures. Initially started on IV Clindamycin on admission, now on Vancomycin. Await Gen Surgery follow up and further recommendations.Continues to do well withoutfever or leukocytosis  Active Problems:  Hidradenitis suppurativa, neck and scalp  -chronic issue-will d/w patient regarding preventative measures  -likely causing above   Hypocalcemia  -no further work up needed, ionized and corrected Calcium is normal   Morbid obesity  -counseled regarding importance of weight loss  Disposition: Remain inpatient  DVT Prophylaxis: Prophylactic Lovenox   Code Status: Full code or DNR  Family Communication Daughter at bedside  Procedures:  Neck I&D 7/30  CONSULTS:  general surgery    MEDICATIONS: Scheduled Meds: . enoxaparin (LOVENOX) injection  90 mg Subcutaneous Daily  . vancomycin  1,500 mg Intravenous Q12H   Continuous Infusions: . sodium chloride 100 mL/hr at 12/16/13 0529   PRN Meds:.acetaminophen, acetaminophen, alum & mag hydroxide-simeth, HYDROmorphone (DILAUDID) injection, ondansetron (ZOFRAN) IV, ondansetron, oxyCODONE  Antibiotics: Anti-infectives   Start     Dose/Rate Route Frequency Ordered Stop   12/16/13 0600  [MAR Hold]  vancomycin (VANCOCIN) 1,500 mg in sodium chloride 0.9 % 500 mL IVPB     (On MAR Hold since 12/15/13 1354)   1,500 mg 250 mL/hr over 120 Minutes Intravenous On call to O.R. 12/15/13 1314 12/15/13 1615   12/16/13 0200  vancomycin (VANCOCIN) 1,500 mg in sodium chloride 0.9 % 500 mL IVPB     1,500 mg 250 mL/hr over 120 Minutes Intravenous Every 12 hours 12/15/13 1757     12/14/13 0600  [MAR Hold]  clindamycin (CLEOCIN) IVPB 600 mg  Status:  Discontinued     (On MAR Hold since 12/15/13 1354)   600 mg 100 mL/hr over 30 Minutes Intravenous 3 times per day 12/14/13 0154 12/15/13 1702   12/13/13 2345  clindamycin (CLEOCIN) IVPB 600 mg     600 mg 100 mL/hr over 30 Minutes Intravenous  Once 12/13/13 2344 12/14/13 0153       PHYSICAL EXAM: Vital signs in last 24 hours: Filed Vitals:   12/15/13 2144 12/16/13 0211 12/16/13 0607 12/16/13 0951  BP: 140/90 112/67 135/79 121/75  Pulse: 87 92 95 95  Temp: 97.7 F (36.5 C) 98.1 F (36.7 C) 98.9 F (37.2 C) 98.3 F (36.8 C)  TempSrc: Oral Oral Oral Oral  Resp: 15 16 15 16   Height:      Weight:      SpO2: 92% 95% 96% 93%    Weight change:  Filed Weights   12/14/13 0209  Weight: 176 kg (388 lb 0.2 oz)   Body mass index is 54.14 kg/(m^2).   Gen Exam: Awake and alert with clear speech.   Neck: Red, tender. Approximately 3 cm wound site on right posterior neck. Wound dressings in place. Chest: B/L Clear.  CVS: S1 S2 Regular, no murmurs.  Abdomen: soft, BS +, non tender, non distended.  Extremities: no edema, lower extremities warm to touch. Neurologic: Non Focal.   Skin: No Rash.   Wounds: Surgical site on right posterior neck. Packed with 1/2 inch iodoform gauze.   LAB RESULTS: CBC  Chemistries   Recent Labs Lab 12/14/13 0551  NA 140  K 4.0  CL 104  CO2 23  GLUCOSE 102*  BUN 10  CREATININE 0.78  CALCIUM 7.6*    CBG: No results found for this basename: GLUCAP,  in the last 168  hours   MICROBIOLOGY: Recent Results (from the past 240 hour(s))  MRSA PCR SCREENING     Status: None   Collection Time    12/15/13  1:27 PM      Result Value Ref Range Status   MRSA by PCR NEGATIVE  NEGATIVE Final   Comment:            The GeneXpert MRSA Assay (FDA     approved for NASAL specimens     only), is one component of a     comprehensive MRSA colonization     surveillance program. It is not     intended to diagnose MRSA     infection nor to guide or     monitor treatment for     MRSA infections.  ANAEROBIC CULTURE     Status: None   Collection Time    12/15/13  2:43 PM      Result Value Ref Range Status   Specimen Description ABSCESS RIGHT NECK   Final   Special Requests PATIENT ON FOLLOWING VANCOMYCIN   Final   Gram Stain     Final   Value: MODERATE WBC PRESENT,BOTH PMN AND MONONUCLEAR     RARE SQUAMOUS EPITHELIAL CELLS PRESENT     FEW GRAM POSITIVE COCCI IN PAIRS     IN CLUSTERS     Performed at [facility]   Culture PENDING   Incomplete   Report Status PENDING   Incomplete  CULTURE, ROUTINE-ABSCESS     Status: None   Collection Time    12/15/13  2:43 PM      Result Value Ref Range Status   Specimen Description ABSCESS RIGHT NECK   Final   Special Requests PATIENT ON FOLLOWING VANCOMYCIN   Final   Gram Stain     Final   Value: MODERATE WBC PRESENT,BOTH PMN AND MONONUCLEAR     RARE SQUAMOUS EPITHELIAL CELLS PRESENT     FEW GRAM POSITIVE COCCI IN PAIRS     IN CLUSTERS     Performed at [facility]   Culture     Final   Value: NO GROWTH     Performed at [facility]   Report Status PENDING   Incomplete    RADIOLOGY STUDIES/RESULTS: Ct Soft Tissue Neck W Contrast  12/15/2013   CLINICAL DATA:  RIGHT neck soreness, with questionable bogginess or fluctuance on exam, question deep neck abscess, history morbid obesity, hidradenitis suppurative  EXAM: CT NECK WITH CONTRAST  TECHNIQUE: Multidetector CT imaging of the neck was performed  using the standard protocol following the bolus administration of intravenous contrast. Sagittal and coronal MPR images reconstructed from axial data set.  CONTRAST:  80mL OMNIPAQUE IOHEXOL 300 MG/ML  SOLN IV  COMPARISON:  01/28/2006  FINDINGS: Few scattered motion artifacts.  Visualized intracranial structures unremarkable.  Major vascular structures appear grossly patent.  Skin thickening with associated  subcutaneous edema identified at the lateral and posterior RIGHT cervical region extending to the RIGHT trapezius muscle superiorly.  In addition, abnormal fluid collection with enhancing margins identified in the posterior lateral RIGHT cervical region, either within or immediately adjacent to RIGHT trapezius muscle compatible with a deep abscess, in greatest dimensions measuring 4.0 x 1.5 x 2.5 cm.  Edema extends to superficial margin of the splenius capitis with loss of intervening fat plane.  RIGHT splenius capitis appears mildly enlarged versus LEFT though a discrete enhancing collection is not identified within this muscle.  Enlargement of parapharyngeal tonsillar tissue.  Symmetric appearing thyroid, submandibular and parotid glands.  Numerous normal size to new borderline enlarged cervical lymph nodes identified particularly on RIGHT.  Residual skin thickening and subcutaneous infiltration identified at site of a previous subcutaneous abscess at the posterior LEFT neck.  Few beam hardening artifacts of dental origin.  No anterior cervical soft tissue process identified.  No additional mass or abnormal fluid collection.  Lung apices clear.  No acute osseous findings.  IMPRESSION: Inflammatory process in the posterior RIGHT cervical region with skin thickening, subcutaneous edema, and an enhancing fluid collection within or immediately adjacent to the RIGHT trapezius muscle consistent with an abscess 4.0 x 1.5 x 2.5 cm in size.  Edema extends to the superficial fascia of the underlying RIGHT splenius capitis  muscle, which appears minimally enlarged versus LEFT, though no definite additional abnormal fluid collections are identified.   Electronically Signed   By: Ulyses SouthwardMark  Boles M.D.   On: 12/15/2013 11:24    Meredeth IdeStorm, Joshua O, PA-S, [employer]   [facility] Pager:[phone]  If 7PM-7AM, please contact night-coverage [email or web address] Password [email or web address] 12/16/2013, 10:01 AM   LOS: 3 days   **Disclaimer: This note may have been dictated with voice recognition software. Similar sounding words can inadvertently be transcribed and this note may contain transcription errors which may not have been corrected upon publication of note.**

## 2013-12-17 MED ORDER — CLINDAMYCIN HCL 300 MG PO CAPS: 300.0000 mg | ORAL_CAPSULE | Freq: Three times a day (TID) | ORAL | Status: DC

## 2013-12-17 MED ORDER — HYDROMORPHONE HCL PF 1 MG/ML IJ SOLN
1.0000 mg | Freq: Once | INTRAMUSCULAR | Status: AC
  Administered 2013-12-17: 1 mg via INTRAVENOUS

## 2013-12-17 MED ORDER — OXYCODONE-ACETAMINOPHEN 5-325 MG PO TABS: 1.0000 | ORAL_TABLET | Freq: Four times a day (QID) | ORAL | Status: DC | PRN

## 2013-12-17 MED ORDER — OXYCODONE HCL 5 MG PO TABS: 5.0000 mg | ORAL_TABLET | ORAL | Status: DC | PRN

## 2013-12-17 NOTE — Discharge Instructions (Signed)
° °  Follow with Primary MD  Kaleen MaskELKINS,WILSON OLIVER, MD and [facility] as instructed your Hospitalist MD  Please get a complete blood count and chemistry panel checked by your Primary MD at your next visit, and again as instructed by your Primary MD.  Dressing Instructions Remove packing in shower, clean area with soap and water. Repack dressing with 1/4 inch iodoform gauze Daily. Replace dry dressing on top and minimal tape. Pre-medicate before dressing changes..  Get Medicines reviewed and adjusted. Please take all your medications with you for your next visit with your Primary MD  Please request your Primary MD to go over all hospital tests and procedure/radiological results at the follow up, please ask your Primary MD to get all Hospital records sent to his/her office.  If you experience worsening of your admission symptoms, develop shortness of breath, life threatening emergency, suicidal or homicidal thoughts you must seek medical attention immediately by calling 911 or calling your MD immediately  if symptoms less severe.  You must read complete instructions/literature along with all the possible adverse reactions/side effects for all the Medicines you take and that have been prescribed to you. Take any new Medicines after you have completely understood and accpet all the possible adverse reactions/side effects.   Do not drive when taking Pain medications.   Do not take more than prescribed Pain, Sleep and Anxiety Medications  Special Instructions: If you have smoked or chewed Tobacco  in the last 2 yrs please stop smoking, stop any regular Alcohol  and or any Recreational drug use.  Wear Seat belts while driving.  Please note  You were cared for by a hospitalist during your hospital stay. Once you are discharged, your primary care physician will handle any further medical issues. Please note that NO REFILLS for any discharge medications will be authorized once you are  discharged, as it is imperative that you return to your primary care physician (or establish a relationship with a primary care physician if you do not have one) for your aftercare needs so that they can reassess your need for medications and monitor your lab values.

## 2013-12-17 NOTE — Progress Notes (Deleted)
CARE MANAGEMENT NOTE 12/17/2013  Patient:  Albert White,Albert White   Account Number:  [id number]  Date Initiated:  12/16/2013  Documentation initiated by:  HUTCHINSON,CRYSTAL  Subjective/Objective Assessment:   shoulder and back pain  Hx/o paraplegic x 11 years     Action/Plan:   CM to follow for dispositon needs   Anticipated DC Date:  12/16/2013   Anticipated DC Plan:  HOME W HOME HEALTH SERVICES      DC Planning Services  CM consult      PAC Choice  HOME HEALTH   Choice offered to / List presented to:  NA        HH arranged  HH-1 RN      HH agency  [facility]   Status of service:  Completed, signed off Medicare Important Message given?   (If response is "NO", the following Medicare IM given date fields will be blank) Date Medicare IM given:   Medicare IM given by:   Date Additional Medicare IM given:   Additional Medicare IM given by:    Discharge Disposition:  HOME W HOME HEALTH SERVICES  Per UR Regulation:  Reviewed for med. necessity/level of care/duration of stay  If discussed at Long Length of Stay Meetings, dates discussed:    Comments:  12/17/13 13:37 CM met with pt and wife in room to confirm [facility] for wound care dressing changes.  Wife states [facility] and a referral was made to [facility] for a bed and Kyle from [facility] was to call them but has not as of today.  CM encouraged pt and wife to call [facility] and if they do not have resolution, to call the [facility] where the referral was made and to have them contact [facility].  [facility] was notified of discharge plan and CSW notified for ambulance transport home for the pt.  No other CM needs were communicated.  Lakyia Behe, BSN, CM [phone].  rystal Hutchinson RN, BSN, MSHL, CCM  Nurse - Case Manager, (Unit 3EC)  [phone]  12/16/2013 Social:  From home with wife/PCG.  Bedbound x 12 years. Chronic wounds and chronic foley States hx/o  patient has chest pain when his HBG is low. Wife interested in Procrit injections to asssit with mgmt of HBG to avoid admissions and need for blood. PCP:  Dr. Mark A Perini - last appt 08/14/2013 and missed last appt 12/09/2013 d/t transportation issues Transportation:  [facility] used however, patient states will only transport to Wound Clinic appts but not PCP or Cardiology appts d/t owing money on past trips.  Wife/PCG is not able to transport herself. HHS: active:  [facility]  - Wound mgmt. CM provided education  on transfusions can be provided as OP mgmt but issues with patient keeping MD appts has been identified. CM provided education on option of Physician Home Visits. Wife agrees that this would be helpful and agreed to CM making referral to establish services. CM contacted HHS:  [facility] and confirmed HHS nurse is able to provide Procrit injections if ordered but would not be provided by [facility] Pharm[facility] and Rx would need to be filled via other pharmacy provider. CM notified [facility] of new referral and faxed [phone] phone [phone] fax Disposition Plan: HHS:  RN - Resume  Disposition:  Home with  HHS:  RN for NEW CHF Disease MGMT; Medication MGMT, Wound Assessment and  MGMT and assessment. Current IP wound care orders   sent to HHS provider.  Assess current bed and equipment being used in the home; patient has been in low airloss mattress and same bed 48 inch x 12 years.  Wt 208 Ht 6'2"   

## 2013-12-17 NOTE — Progress Notes (Signed)
Patient discharge teaching given, including activity, diet, follow-up appoints, wounds, and medications. Patient verbalized understanding of all discharge instructions. IV access was d/c'd. Vitals are stable. Skin is intact except as charted in most recent assessments. Pt to be escorted out by NT, to be driven home by family.

## 2013-12-17 NOTE — Progress Notes (Signed)
CARE MANAGEMENT NOTE 12/17/2013  Patient:  Albert White, Albert White   Account Number:  [id number]  Date Initiated:  12/17/2013  Documentation initiated by:  [facility]  Subjective/Objective Assessment:   adm: Cellulitis with abscess of the right posterior neck     Action/Plan:   dischage planning   Anticipated DC Date:  12/17/2013   Anticipated DC Plan:  [place]  CM consult      [facility] Choice  HOME HEALTH   Choice offered to / List presented to:  C-1 Patient        [place] arranged  HH-1 RN      [place].   Status of service:  Completed, signed off Medicare Important Message given?   (If response is "NO", the following Medicare IM given date fields will be blank) Date Medicare IM given:   Medicare IM given by:   Date Additional Medicare IM given:   Additional Medicare IM given by:    Discharge Disposition:  [place]  Per UR Regulation:    If discussed at Long Length of Stay Meetings, dates discussed:    Comments:  12/17/13 11:19 CM met with pt in room to offer choice for home health agency.  Pt chooses [facility] to render [facility] for dressing changes.  Address and contact information verified with pt.  Referral called to [facility] rep, Stanton Kidney.  No DMe needed. No other CM needs were communicated.  Mariane Masters, BSN, CM [phone].

## 2013-12-17 NOTE — Progress Notes (Signed)
[facility] Progress Note  2 Days Post-Op  Subjective: Pts pain much improved, packing change went well today.  Pt wants to go home.  Afebrile.    Objective: Vital signs in last 24 hours: Temp:  [97.6 F (36.4 C)-98.7 F (37.1 C)] 98.1 F (36.7 C) (08/01 0425) Pulse Rate:  [71-106] 71 (08/01 0425) Resp:  [16-18] 18 (08/01 0425) BP: (121-153)/(65-89) 136/85 mmHg (08/01 0425) SpO2:  [93 %-96 %] 96 % (08/01 0425) Last BM Date: 12/16/13  Intake/Output from previous day: 07/31 0701 - 08/01 0700 In: 730 [P.O.:730] Out: -  Intake/Output this shift:    PE: Gen:  Alert, NAD, pleasant Thick/short neck due to excessive weight/skin folds, large thickened area of skin on the posterior aspect of his neck wrapping around to the right and left side, evidence of extensive scarring with enlarged hair follicles noted. Less induration and cellulitis present.  Packing removed which was approximately 20cm, purulent and sanguinous drainage noted, replaced with approximately the same  Lab Results:   Recent Labs  12/15/13 0645  WBC 8.1  HGB 14.5  HCT 42.2  PLT 211   BMET No results found for this basename: NA, K, CL, CO2, GLUCOSE, BUN, CREATININE, CALCIUM,  in the last 72 hours PT/INR No results found for this basename: LABPROT, INR,  in the last 72 hours CMP     Component Value Date/Time   NA 140 12/14/2013 0551   K 4.0 12/14/2013 0551   CL 104 12/14/2013 0551   CO2 23 12/14/2013 0551   GLUCOSE 102* 12/14/2013 0551   BUN 10 12/14/2013 0551   CREATININE 0.78 12/14/2013 0551   CALCIUM 7.6* 12/14/2013 0551   PROT 5.9* 12/14/2013 0805   ALBUMIN 2.9* 12/14/2013 0805   AST 19 12/14/2013 0805   ALT 21 12/14/2013 0805   ALKPHOS 66 12/14/2013 0805   BILITOT 0.4 12/14/2013 0805   GFRNONAA >90 12/14/2013 0551   GFRAA >90 12/14/2013 0551   Lipase  No results found for this basename: lipase       Studies/Results: Ct Soft Tissue Neck W Contrast  12/15/2013   CLINICAL DATA:  RIGHT neck  soreness, with questionable bogginess or fluctuance on exam, question deep neck abscess, history morbid obesity, hidradenitis suppurative  EXAM: CT NECK WITH CONTRAST  TECHNIQUE: Multidetector CT imaging of the neck was performed using the standard protocol following the bolus administration of intravenous contrast. Sagittal and coronal MPR images reconstructed from axial data set.  CONTRAST:  80mL OMNIPAQUE IOHEXOL 300 MG/ML  SOLN IV  COMPARISON:  01/28/2006  FINDINGS: Few scattered motion artifacts.  Visualized intracranial structures unremarkable.  Major vascular structures appear grossly patent.  Skin thickening with associated subcutaneous edema identified at the lateral and posterior RIGHT cervical region extending to the RIGHT trapezius muscle superiorly.  In addition, abnormal fluid collection with enhancing margins identified in the posterior lateral RIGHT cervical region, either within or immediately adjacent to RIGHT trapezius muscle compatible with a deep abscess, in greatest dimensions measuring 4.0 x 1.5 x 2.5 cm.  Edema extends to superficial margin of the splenius capitis with loss of intervening fat plane.  RIGHT splenius capitis appears mildly enlarged versus LEFT though a discrete enhancing collection is not identified within this muscle.  Enlargement of parapharyngeal tonsillar tissue.  Symmetric appearing thyroid, submandibular and parotid glands.  Numerous normal size to new borderline enlarged cervical lymph nodes identified particularly on RIGHT.  Residual skin thickening and subcutaneous infiltration identified at site of a previous subcutaneous abscess  at the posterior LEFT neck.  Few beam hardening artifacts of dental origin.  No anterior cervical soft tissue process identified.  No additional mass or abnormal fluid collection.  Lung apices clear.  No acute osseous findings.  IMPRESSION: Inflammatory process in the posterior RIGHT cervical region with skin thickening, subcutaneous edema,  and an enhancing fluid collection within or immediately adjacent to the RIGHT trapezius muscle consistent with an abscess 4.0 x 1.5 x 2.5 cm in size.  Edema extends to the superficial fascia of the underlying RIGHT splenius capitis muscle, which appears minimally enlarged versus LEFT, though no definite additional abnormal fluid collections are identified.   Electronically Signed   By: Ulyses Southward M.D.   On: 12/15/2013 11:24    Anti-infectives: Anti-infectives   Start     Dose/Rate Route Frequency Ordered Stop   12/16/13 0600  [MAR Hold]  vancomycin (VANCOCIN) 1,500 mg in sodium chloride 0.9 % 500 mL IVPB     (On MAR Hold since 12/15/13 1354)   1,500 mg 250 mL/hr over 120 Minutes Intravenous On call to O.R. 12/15/13 1314 12/15/13 1615   12/16/13 0200  vancomycin (VANCOCIN) 1,500 mg in sodium chloride 0.9 % 500 mL IVPB     1,500 mg 250 mL/hr over 120 Minutes Intravenous Every 12 hours 12/15/13 1757     12/14/13 0600  [MAR Hold]  clindamycin (CLEOCIN) IVPB 600 mg  Status:  Discontinued     (On MAR Hold since 12/15/13 1354)   600 mg 100 mL/hr over 30 Minutes Intravenous 3 times per day 12/14/13 0154 12/15/13 1702   12/13/13 2345  clindamycin (CLEOCIN) IVPB 600 mg     600 mg 100 mL/hr over 30 Minutes Intravenous  Once 12/13/13 2344 12/14/13 0153       Assessment/Plan Posterior/right sided neck cellulitis with abscess s/p I&D and packing by Dr. Lindie Spruce on 12/16/13  Hidradenitis suppurativa - chronic  Morbid obesity   Plan:  1. Packing removed today.  Will need packing daily to help prevent for purulent drainage to from re-accumulating. He may be able to go home tomorrow on oral antibiotics 2. Needs HH nursing care for dressing changes.  Remove packing in shower, clean area with soap and water.  Repack dressing with 1/4 inch iodoform gauze Daily.  Replace dry dressing on top and minimal tape.  Pre-medicate before dressing changes. 3.  Patient understands preventative measure to take in the  future    LOS: 4 days    DORT, Brittney Caraway 12/17/2013, 7:50 AM Pager: [phone]

## 2013-12-17 NOTE — Progress Notes (Signed)
I have seen and examined the patient and agree with the assessment and plans.  Kaiel Weide A. Carizma Dunsworth  MD, FACS  

## 2013-12-17 NOTE — Discharge Summary (Signed)
PATIENT DETAILS Name: Albert White Age: 38 y.o. Sex: male Date of Birth: [birth year] MRN: [id number]. Admit Date: 12/13/2013 Admitting Physician: Ron Parker, MD DGU:[id number] Joelene Millin, MD  Recommendations for Outpatient Follow-up:  1. Please follow wound culture (intraoperative) till final.  PRIMARY DISCHARGE DIAGNOSIS:  Principal Problem:   Cellulitis with abscess of the right posterior neck Active Problems:   Hidradenitis suppurativa, neck and scalp   Hypocalcemia   Morbid obesity   Abscess      PAST MEDICAL HISTORY: Past Medical History  Diagnosis Date  . Abscess   . Hidradenitis suppurativa   . Morbid obesity   . Vertigo     DISCHARGE MEDICATIONS:   Medication List    STOP taking these medications       sulfamethoxazole-trimethoprim 800-160 MG per tablet  Commonly known as:  BACTRIM DS     traMADol 50 MG tablet  Commonly known as:  ULTRAM      TAKE these medications       clindamycin 300 MG capsule  Commonly known as:  CLEOCIN  Take 1 capsule (300 mg total) by mouth 3 (three) times daily.     famotidine 20 MG tablet  Commonly known as:  PEPCID  Take 20 mg by mouth 2 (two) times daily as needed for heartburn or indigestion.     oxyCODONE 5 MG immediate release tablet  Commonly known as:  Oxy IR/ROXICODONE  Take 1-2 tablets (5-10 mg total) by mouth every 4 (four) hours as needed for moderate pain.        ALLERGIES:  No Known Allergies  BRIEF HPI:  See H&P, Labs, Consult and Test reports for all details in brief, patient was admitted for evaluation and treatment of cellulitis of the right posterior neck.  CONSULTATIONS:   general surgery  PERTINENT RADIOLOGIC STUDIES: Ct Soft Tissue Neck W Contrast  12/15/2013   CLINICAL DATA:  RIGHT neck soreness, with questionable bogginess or fluctuance on exam, question deep neck abscess, history morbid obesity, hidradenitis suppurative  EXAM: CT NECK WITH CONTRAST  TECHNIQUE: Multidetector  CT imaging of the neck was performed using the standard protocol following the bolus administration of intravenous contrast. Sagittal and coronal MPR images reconstructed from axial data set.  CONTRAST:  80mL OMNIPAQUE IOHEXOL 300 MG/ML  SOLN IV  COMPARISON:  01/28/2006  FINDINGS: Few scattered motion artifacts.  Visualized intracranial structures unremarkable.  Major vascular structures appear grossly patent.  Skin thickening with associated subcutaneous edema identified at the lateral and posterior RIGHT cervical region extending to the RIGHT trapezius muscle superiorly.  In addition, abnormal fluid collection with enhancing margins identified in the posterior lateral RIGHT cervical region, either within or immediately adjacent to RIGHT trapezius muscle compatible with a deep abscess, in greatest dimensions measuring 4.0 x 1.5 x 2.5 cm.  Edema extends to superficial margin of the splenius capitis with loss of intervening fat plane.  RIGHT splenius capitis appears mildly enlarged versus LEFT though a discrete enhancing collection is not identified within this muscle.  Enlargement of parapharyngeal tonsillar tissue.  Symmetric appearing thyroid, submandibular and parotid glands.  Numerous normal size to new borderline enlarged cervical lymph nodes identified particularly on RIGHT.  Residual skin thickening and subcutaneous infiltration identified at site of a previous subcutaneous abscess at the posterior LEFT neck.  Few beam hardening artifacts of dental origin.  No anterior cervical soft tissue process identified.  No additional mass or abnormal fluid collection.  Lung apices clear.  No acute osseous findings.  IMPRESSION: Inflammatory process in the posterior RIGHT cervical region with skin thickening, subcutaneous edema, and an enhancing fluid collection within or immediately adjacent to the RIGHT trapezius muscle consistent with an abscess 4.0 x 1.5 x 2.5 cm in size.  Edema extends to the superficial fascia of  the underlying RIGHT splenius capitis muscle, which appears minimally enlarged versus LEFT, though no definite additional abnormal fluid collections are identified.   Electronically Signed   By: Ulyses Southward M.D.   On: 12/15/2013 11:24     PERTINENT LAB RESULTS: CBC:  Recent Labs  12/15/13 0645  WBC 8.1  HGB 14.5  HCT 42.2  PLT 211   CMET CMP     Component Value Date/Time   NA 140 12/14/2013 0551   K 4.0 12/14/2013 0551   CL 104 12/14/2013 0551   CO2 23 12/14/2013 0551   GLUCOSE 102* 12/14/2013 0551   BUN 10 12/14/2013 0551   CREATININE 0.78 12/14/2013 0551   CALCIUM 7.6* 12/14/2013 0551   PROT 5.9* 12/14/2013 0805   ALBUMIN 2.9* 12/14/2013 0805   AST 19 12/14/2013 0805   ALT 21 12/14/2013 0805   ALKPHOS 66 12/14/2013 0805   BILITOT 0.4 12/14/2013 0805   GFRNONAA >90 12/14/2013 0551   GFRAA >90 12/14/2013 0551    GFR Estimated Creatinine Clearance: 206.7 ml/min (by C-G formula based on Cr of 0.78). No results found for this basename: LIPASE, AMYLASE,  in the last 72 hours No results found for this basename: CKTOTAL, CKMB, CKMBINDEX, TROPONINI,  in the last 72 hours No components found with this basename: POCBNP,  No results found for this basename: DDIMER,  in the last 72 hours No results found for this basename: HGBA1C,  in the last 72 hours No results found for this basename: CHOL, HDL, LDLCALC, TRIG, CHOLHDL, LDLDIRECT,  in the last 72 hours No results found for this basename: TSH, T4TOTAL, FREET3, T3FREE, THYROIDAB,  in the last 72 hours No results found for this basename: VITAMINB12, FOLATE, FERRITIN, TIBC, IRON, RETICCTPCT,  in the last 72 hours Coags: No results found for this basename: PT, INR,  in the last 72 hours Microbiology: Recent Results (from the past 240 hour(s))  MRSA PCR SCREENING     Status: None   Collection Time    12/15/13  1:27 PM      Result Value Ref Range Status   MRSA by PCR NEGATIVE  NEGATIVE Final   Comment:            The GeneXpert MRSA Assay  (FDA     approved for NASAL specimens     only), is one component of a     comprehensive MRSA colonization     surveillance program. It is not     intended to diagnose MRSA     infection nor to guide or     monitor treatment for     MRSA infections.  ANAEROBIC CULTURE     Status: None   Collection Time    12/15/13  2:43 PM      Result Value Ref Range Status   Specimen Description ABSCESS RIGHT NECK   Final   Special Requests PATIENT ON FOLLOWING VANCOMYCIN   Final   Gram Stain     Final   Value: MODERATE WBC PRESENT,BOTH PMN AND MONONUCLEAR     RARE SQUAMOUS EPITHELIAL CELLS PRESENT     FEW GRAM POSITIVE COCCI IN PAIRS     IN CLUSTERS     Performed at  [facility]     Final   Value: NO ANAEROBES ISOLATED; CULTURE IN PROGRESS FOR 5 DAYS     Performed at [facility]   Report Status PENDING   Incomplete  CULTURE, ROUTINE-ABSCESS     Status: None   Collection Time    12/15/13  2:43 PM      Result Value Ref Range Status   Specimen Description ABSCESS RIGHT NECK   Final   Special Requests PATIENT ON FOLLOWING VANCOMYCIN   Final   Gram Stain     Final   Value: MODERATE WBC PRESENT,BOTH PMN AND MONONUCLEAR     RARE SQUAMOUS EPITHELIAL CELLS PRESENT     FEW GRAM POSITIVE COCCI IN PAIRS     IN CLUSTERS     Performed at [facility]   Culture     Final   Value: NO GROWTH     Performed at [facility]   Report Status PENDING   Incomplete     BRIEF HOSPITAL COURSE:   Principal Problem: Cellulitis with abscess of Neck of Right Post Neck  - Patient was admitted and started on IV clindamycin, unfortunately he did not make clinical improvement, general surgery was consulted, upon admission a CT scan of the neck was obtained which confirmed an abscess. Patient subsequently underwent an incision and drainage on 7/30, intraoperative cultures are negative so far. Postoperatively he was switched to vancomycin. He is doing well without major  complaints. General surgery has followed up patient today and has cleared the patient for discharge, will arrange for home health RN for ongoing wound care needs. On discharge he will be transitioned back to clindamycin for one more week.  Active Problems: Hidradenitis suppurativa, neck and scalp  -chronic issue-will d/w patient regarding preventative measures  -likely causing above   Hypocalcemia  -no further work up needed while inpatient, ionized and corrected Calcium is normal  - Follow up with primary care practitioner for further treatment and monitoring.  Morbid obesity  -counseled regarding importance of weight loss  TODAY-DAY OF DISCHARGE:  Subjective:   Lonzo Saulter today has no headache,no chest abdominal pain,no new weakness tingling or numbness, feels much better wants to go home today.   Objective:   Blood pressure 136/85, pulse 71, temperature 98.1 F (36.7 C), temperature source Oral, resp. rate 18, height 5\' 11"  (1.803 m), weight 176 kg (388 lb 0.2 oz), SpO2 96.00%.  Intake/Output Summary (Last 24 hours) at 12/17/13 1125 Last data filed at 12/16/13 1755  Gross per 24 hour  Intake    480 ml  Output      0 ml  Net    480 ml   Filed Weights   12/14/13 0209  Weight: 176 kg (388 lb 0.2 oz)    Exam Awake Alert, Oriented *3, No new F.N deficits, Normal affect [place].AT,PERRAL Supple Neck,No JVD, No cervical lymphadenopathy appriciated.  Symmetrical Chest wall movement, Good air movement bilaterally, CTAB RRR,No Gallops,Rubs or new Murmurs, No Parasternal Heave +ve B.Sounds, Abd Soft, Non tender, No organomegaly appriciated, No rebound -guarding or rigidity. No Cyanosis, Clubbing or edema, No new Rash or bruise  DISCHARGE CONDITION: Stable  DISPOSITION: Home  DISCHARGE INSTRUCTIONS:    Activity:  As tolerated   Diet recommendation: Regular Diet  Discharge Instructions   Call MD for:  redness, tenderness, or signs of infection (pain, swelling,  redness, odor or green/yellow discharge around incision site)    Complete by:  As directed  Discharge wound care:    Complete by:  As directed   Remove packing in shower, clean area with soap and water.  Repack dressing with 1/4 inch iodoform gauze Daily.  Replace dry dressing on top and minimal tape.  Pre-medicate before dressing changes.     Increase activity slowly    Complete by:  As directed      Wound care    Complete by:  As directed   [facility]  Remove packing in shower, clean area with soap and water.  Repack dressing with 1/4 inch iodoform gauze Daily.  Replace dry dressing on top and minimal tape.  Pre-medicate before dressing changes.           Follow-up Information   Follow up with Kaleen MaskELKINS,WILSON OLIVER, MD. Schedule an appointment as soon as possible for a visit in 1 week.   Specialty:  Family Medicine   Contact information:   [address] [place] [address] [phone]       Follow up with Cherylynn RidgesWYATT, JAMES O, MD. Schedule an appointment as soon as possible for a visit in 2 weeks. (For post-operation check)    Specialty:  General Surgery   Contact information:   [address], [address]  [facility] [place] [address] [phone]       Total Time spent on discharge equals 45 minutes.  SignedJeoffrey Massed: Lunah Losasso 12/17/2013 11:25 AM  **Disclaimer: This note may have been dictated with voice recognition software. Similar sounding words can inadvertently be transcribed and this note may contain transcription errors which may not have been corrected upon publication of note.**

## 2013-12-19 ENCOUNTER — Encounter (HOSPITAL_COMMUNITY): Payer: Self-pay | Admitting: General Surgery

## 2013-12-20 ENCOUNTER — Telehealth (INDEPENDENT_AMBULATORY_CARE_PROVIDER_SITE_OTHER): Payer: Self-pay

## 2013-12-20 ENCOUNTER — Telehealth (HOSPITAL_COMMUNITY): Payer: Self-pay

## 2013-12-20 LAB — ANAEROBIC CULTURE

## 2013-12-20 LAB — CULTURE, ROUTINE-ABSCESS: Culture: NO GROWTH

## 2013-12-20 NOTE — Telephone Encounter (Signed)
Redirected to call [facility].

## 2013-12-20 NOTE — Telephone Encounter (Signed)
Pt s/p I&D neck abscess on 12/15/13 with Dr Lindie SpruceWyatt. Pt is calling today to get a refill on Oxycodone 5mg . Pt rates his pain a 8 at this time. Pt states that he has not been taking any Ibuprofen at this time. Advised pt that he can take Ibuprofen up to 800mg  every 8 hours as needed for pain. Advised pt he can place heat/ice on the area for pain relief as well. Informed pt that I would send Dr Lindie SpruceWyatt a message for refill request. Advised pt that if refilled he has up to 24 hours to refill and he will need to come by the office to pick up meds. Informed pt that we would contact him as soon as we receive a response.  Pt verbalized understanding.

## 2013-12-20 NOTE — ED Provider Notes (Signed)
Medical screening examination/treatment/procedure(s) were conducted as a shared visit with resident-physician practitioner(s) and myself.  I personally evaluated the patient during the encounter.  Pt is a 38 y.o. male with pmhx as above presenting with recurrent posterior neck abscess due to hidradenitis suppritiva.  Pt found to have induration, warmth of posterior neck w/o clear abscess on brief bedside US.  Pt reports worsening of this infection despite PO abx. Will admit for IV abx. Shanna Cisco.    Emmie Frakes E Danyella Mcginty, MD 12/20/13 [phone]

## 2013-12-27 ENCOUNTER — Other Ambulatory Visit (INDEPENDENT_AMBULATORY_CARE_PROVIDER_SITE_OTHER): Payer: Self-pay

## 2013-12-27 MED ORDER — OXYCODONE HCL 5 MG PO TABS: 5.0000 mg | ORAL_TABLET | ORAL | Status: DC | PRN

## 2013-12-27 NOTE — Telephone Encounter (Signed)
Informed pt of message below

## 2013-12-27 NOTE — Telephone Encounter (Signed)
LMOM to call nursing triage. Pt has a rx for Oxycodone 5mg  1-2tab every 4 hrs prn #20 at the front desk for him to pickup.  Also inform pt that he has a po appt with Dr Lindie SpruceWyatt 12/30/13 @ 11:00.

## 2013-12-30 ENCOUNTER — Encounter (INDEPENDENT_AMBULATORY_CARE_PROVIDER_SITE_OTHER): Payer: Self-pay | Admitting: General Surgery

## 2013-12-30 ENCOUNTER — Ambulatory Visit (INDEPENDENT_AMBULATORY_CARE_PROVIDER_SITE_OTHER): Payer: BC Managed Care – PPO | Admitting: General Surgery

## 2013-12-30 VITALS — BP 130/84 | HR 76 | Resp 14 | Ht 71.0 in | Wt 391.6 lb

## 2013-12-30 DIAGNOSIS — Z09 Encounter for follow-up examination after completed treatment for conditions other than malignant neoplasm: Secondary | ICD-10-CM

## 2013-12-30 NOTE — Progress Notes (Signed)
Subjective:     Patient ID: Albert PetrinSteven T Stfort, male   DOB: Feb 08, 1976, 38 y.o.   MRN: [id number]  HPI The patient comes in today S/P I&D or right neck abscess.  It is almost completely healed.  Review of Systems No fevers or chills.    Objective:   Physical Exam Small, < 1.0cm opening on the right posterior-lateral aspect of the neck.  No induration or erythema.    Assessment:     Heal right neck abscess, off antibiotics about one week.     Plan:     Return to see me on an as needed basis.

## 2014-01-04 ENCOUNTER — Encounter (INDEPENDENT_AMBULATORY_CARE_PROVIDER_SITE_OTHER): Payer: BC Managed Care – PPO | Admitting: Surgery

## 2014-01-09 ENCOUNTER — Encounter (HOSPITAL_COMMUNITY): Payer: Self-pay | Admitting: Emergency Medicine

## 2014-01-09 ENCOUNTER — Emergency Department (HOSPITAL_COMMUNITY)
Admission: EM | Admit: 2014-01-09 | Discharge: 2014-01-10 | Disposition: A | Discharge status: Home / Self Care | Payer: BC Managed Care – PPO | Attending: Emergency Medicine | Admitting: Emergency Medicine

## 2014-01-09 DIAGNOSIS — Z87891 Personal history of nicotine dependence: Secondary | ICD-10-CM | POA: Insufficient documentation

## 2014-01-09 DIAGNOSIS — L0211 Cutaneous abscess of neck: Secondary | ICD-10-CM | POA: Diagnosis not present

## 2014-01-09 DIAGNOSIS — Z792 Long term (current) use of antibiotics: Secondary | ICD-10-CM | POA: Diagnosis not present

## 2014-01-09 DIAGNOSIS — L03221 Cellulitis of neck: Principal | ICD-10-CM

## 2014-01-09 DIAGNOSIS — L0291 Cutaneous abscess, unspecified: Secondary | ICD-10-CM

## 2014-01-09 MED ORDER — OXYCODONE-ACETAMINOPHEN 5-325 MG PO TABS
2.0000 | ORAL_TABLET | Freq: Once | ORAL | Status: AC
  Administered 2014-01-09: 2 via ORAL
  Filled 2014-01-09: qty 2

## 2014-01-09 NOTE — ED Provider Notes (Signed)
CSN: [id number]     Arrival date & time 01/09/14  2120 History   None    This chart was scribed for non-physician practitioner, Mellody Drown PA-C working with Flint Melter, MD by Arlan Organ, ED Scribe. This patient was seen in room TR08C/TR08C and the patient's care was started at 10:28 PM.  .  No chief complaint on file.  HPI Comments: Albert White is a 38 y.o. male with a PMHx of hidradenitis suppurativa, who presents to the Emergency Department complaining of a recurrent abscess to the posterior neck. The patent states he noted swelling and pain to the neck just right after lunch time today. Pain currently radiates down his neck. Reports taking oxycodone without full resolution of symptoms. Denies fever or chills.  Denies history of DM. The patient reports similar complaints in the past, ongoing since age 61. Has required surgical I&D July 30th 2015.     The history is provided by the patient. No language interpreter was used.    Past Medical History  Diagnosis Date  . Abscess   . Hidradenitis suppurativa   . Morbid obesity   . Vertigo    Past Surgical History  Procedure Laterality Date  . Incision and drainage deep neck abscess      Hidradenitis - 3-4 times at [facility] and [place]  . Incision and drainage abscess Right 12/15/2013    Procedure: INCISION AND DRAINAGE Right Posterior neck ABSCESS;  Surgeon: Cherylynn Ridges, MD;  Location: [facility] OR;  Service: General;  Laterality: Right;   No family history on file. History  Substance Use Topics  . Smoking status: Former Smoker -- 1.00 packs/day    Quit date: 11/27/2013  . Smokeless tobacco: Never Used  . Alcohol Use: 0.6 oz/week    1 Cans of beer per week     Comment: social    Review of Systems  Constitutional: Negative for fever and chills.  Skin: Positive for color change.  All other systems reviewed and are negative.     Allergies  Review of patient's allergies indicates no known allergies.  Home Medications    Prior to Admission medications   Medication Sig Start Date End Date Taking? Authorizing Provider  clindamycin (CLEOCIN) 300 MG capsule Take 1 capsule (300 mg total) by mouth 3 (three) times daily. 12/17/13   Shanker Levora Dredge, MD  famotidine (PEPCID) 20 MG tablet Take 20 mg by mouth 2 (two) times daily as needed for heartburn or indigestion.    Historical Provider, MD  oxyCODONE (OXY IR/ROXICODONE) 5 MG immediate release tablet Take 1-2 tablets (5-10 mg total) by mouth every 4 (four) hours as needed for moderate pain. 12/27/13   Cherylynn Ridges, MD   Triage Vitals: There were no vitals taken for this visit.   Physical Exam  Nursing note and vitals reviewed. Constitutional: He is oriented to person, place, and time. He appears well-developed and well-nourished.  Non-toxic appearance. He does not have a sickly appearance. He does not appear ill. No distress.  Obese male  HENT:  Head: Normocephalic and atraumatic.  Neck: Neck supple.    Large area of induration with central fluctuance.  Tenderness extending from the Left occipital region to the left trapezius.  Cardiovascular: Normal rate and regular rhythm.   Pulmonary/Chest: Effort normal. No respiratory distress. He has no wheezes. He has no rales.  Neurological: He is oriented to person, place, and time.  Skin: Skin is warm. He is not diaphoretic.  Psychiatric: He has  a normal mood and affect. His behavior is normal.    ED Course  INCISION AND DRAINAGE Date/Time: 01/10/2014 12:01 AM Performed by: Mellody Drown Authorized by: Mellody Drown Consent: Verbal consent obtained. Risks and benefits: risks, benefits and alternatives were discussed Consent given by: patient Patient understanding: patient states understanding of the procedure being performed Patient consent: the patient's understanding of the procedure matches consent given Required items: required blood products, implants, devices, and special equipment available Patient  identity confirmed: verbally with patient Time out: Immediately prior to procedure a "time out" was called to verify the correct patient, procedure, equipment, support staff and site/side marked as required. Type: abscess Body area: head/neck Location details: scalp Anesthesia: local infiltration Local anesthetic: lidocaine 2% with epinephrine Anesthetic total: 2 ml Patient sedated: no Scalpel size: 11 Needle gauge: 22 Incision type: single straight Drainage: purulent Drainage amount: moderate Wound treatment: wound left open Comments: Patient requested procedure to be stopped due to discomfort, unable to procure breakup locations, or apply pressure to the area do to pain.   (including critical care time)  DIAGNOSTIC STUDIES:  COORDINATION OF CARE: 9:30 PM-Discussed treatment plan with pt at bedside and pt agreed to plan.     Labs Review Labs Reviewed - No data to display  Imaging Review No results found.   EKG Interpretation None      MDM   Final diagnoses:  Abscess   Patient presents with abscess to left neck, hairline, afebrile. Exam limited by body habitus, unable to fully palpate extent of induration and fluctuance. Dr. Effie Shy also evaluated the patient during this encounter. Advises I&D, oral antibiotics and outpatient followup. Patient requested provider to stop I&D, due 2 discomfort. Moderate amount of purulent drainage after incision, unable to apply pressure due to discomfort.  Will discharge home with pain mediation, antibiotics, and follow up with [name].  Meds given in ED:  Medications  oxyCODONE-acetaminophen (PERCOCET/ROXICET) 5-325 MG per tablet 2 tablet (2 tablets Oral Given 01/09/14 2329)  HYDROmorphone (DILAUDID) injection 1 mg (1 mg Intramuscular Given 01/10/14 0020)    Discharge Medication List as of 01/10/2014 12:10 AM    START taking these medications   Details  !! clindamycin (CLEOCIN) 150 MG capsule Take 3 capsules (450 mg total) by mouth 3  (three) times daily., Starting 01/10/2014, Until Discontinued, Print    ibuprofen (ADVIL,MOTRIN) 800 MG tablet Take 1 tablet (800 mg total) by mouth 3 (three) times daily., Starting 01/10/2014, Until Discontinued, Print    oxyCODONE-acetaminophen (PERCOCET/ROXICET) 5-325 MG per tablet Take 1 tablet by mouth every 4 (four) hours as needed for severe pain., Starting 01/10/2014, Until Discontinued, Print     !! - Potential duplicate medications found. Please discuss with provider.     I personally performed the services described in this documentation, which was scribed in my presence. The recorded information has been reviewed and is accurate.    Mellody Drown, PA-C 01/10/14 [phone]

## 2014-01-09 NOTE — ED Provider Notes (Signed)
  Face-to-face evaluation   History: He reports onset of a nodule, left posterior neck, that started today, he feels like his typical reoccurs in this region. He, states that last week. He talked to his surgeon about having this area of his neck, cleaned out because of hidradenitis suppurativa. He denies fever, chills, nausea, vomiting, weakness, or dizziness  Physical exam: Alert, cooperative. Tender nodule, with fluctuance and redness, left posterior neck, at the skin fold. There is mild surrounding erythema.  Medical screening examination/treatment/procedure(s) were conducted as a shared visit with non-physician practitioner(s) and myself.  I personally evaluated the patient during the encounter  Flint Melter, MD 01/11/14 [phone]

## 2014-01-09 NOTE — ED Notes (Signed)
Area appeared today at 1200.P t reports his he was D/C 1 week ago for surgery for same problem

## 2014-01-10 MED ORDER — HYDROMORPHONE HCL PF 1 MG/ML IJ SOLN
1.0000 mg | Freq: Once | INTRAMUSCULAR | Status: AC
  Administered 2014-01-10: 1 mg via INTRAMUSCULAR
  Filled 2014-01-10: qty 1

## 2014-01-10 MED ORDER — OXYCODONE-ACETAMINOPHEN 5-325 MG PO TABS
1.0000 | ORAL_TABLET | ORAL | Status: DC | PRN
Start: 2014-01-10 — End: 2014-11-08

## 2014-01-10 MED ORDER — CLINDAMYCIN HCL 150 MG PO CAPS: 450.0000 mg | ORAL_CAPSULE | Freq: Three times a day (TID) | ORAL | Status: DC

## 2014-01-10 MED ORDER — IBUPROFEN 800 MG PO TABS: 800.0000 mg | ORAL_TABLET | Freq: Three times a day (TID) | ORAL | Status: DC

## 2014-01-10 NOTE — Discharge Instructions (Signed)
Call Dr. Lindie Spruce for further evaluation and treatment of your abscess. Call for a follow up appointment with a Family or Primary Care Provider.  Return if Symptoms worsen.   Take medication as prescribed.  Continue to apply pressure to the area to allow it to drain, place a warm compress 3-4 times a day.

## 2014-01-10 NOTE — ED Notes (Signed)
Declined W/C at D/C and was escorted to lobby by RN. 

## 2014-11-03 ENCOUNTER — Emergency Department (HOSPITAL_COMMUNITY)
Admission: EM | Admit: 2014-11-03 | Discharge: 2014-11-03 | Disposition: A | Discharge status: Home / Self Care | Payer: 59 | Source: Home / Self Care | Attending: Family Medicine | Admitting: Family Medicine

## 2014-11-03 ENCOUNTER — Emergency Department (INDEPENDENT_AMBULATORY_CARE_PROVIDER_SITE_OTHER): Payer: 59

## 2014-11-03 ENCOUNTER — Encounter (HOSPITAL_COMMUNITY): Payer: Self-pay | Admitting: Emergency Medicine

## 2014-11-03 DIAGNOSIS — S92912A Unspecified fracture of left toe(s), initial encounter for closed fracture: Secondary | ICD-10-CM

## 2014-11-03 DIAGNOSIS — S90112A Contusion of left great toe without damage to nail, initial encounter: Secondary | ICD-10-CM

## 2014-11-03 MED ORDER — CEPHALEXIN 500 MG PO CAPS: 500.0000 mg | ORAL_CAPSULE | Freq: Four times a day (QID) | ORAL | Status: DC

## 2014-11-03 MED ORDER — HYDROCODONE-ACETAMINOPHEN 5-325 MG PO TABS
2.0000 | ORAL_TABLET | Freq: Once | ORAL | Status: AC
  Administered 2014-11-03: 2 via ORAL

## 2014-11-03 MED ORDER — IBUPROFEN 800 MG PO TABS
800.0000 mg | ORAL_TABLET | Freq: Once | ORAL | Status: AC
  Administered 2014-11-03: 800 mg via ORAL

## 2014-11-03 MED ORDER — IBUPROFEN 800 MG PO TABS
ORAL_TABLET | ORAL | Status: AC
  Filled 2014-11-03: qty 1

## 2014-11-03 MED ORDER — HYDROCODONE-ACETAMINOPHEN 7.5-325 MG PO TABS: 1.0000 | ORAL_TABLET | ORAL | Status: DC | PRN

## 2014-11-03 MED ORDER — HYDROCODONE-ACETAMINOPHEN 5-325 MG PO TABS
ORAL_TABLET | ORAL | Status: AC
  Filled 2014-11-03: qty 2

## 2014-11-03 NOTE — ED Provider Notes (Signed)
CSN: [id number]     Arrival date & time 11/03/14  1357 History   First MD Initiated Contact with Patient 11/03/14 1604     Chief Complaint  Patient presents with  . Foot Pain   (Consider location/radiation/quality/duration/timing/severity/associated sxs/prior Treatment) HPI Comments: 39 year old male presents with pain to the left great toe. States he was working and dropped a large heavy piece of wood onto his left great toe. There is discoloration, swelling and mild bleeding from the nail. He is unable to flex or extend due to pain.   Past Medical History  Diagnosis Date  . Abscess   . Hidradenitis suppurativa   . Morbid obesity   . Vertigo    Past Surgical History  Procedure Laterality Date  . Incision and drainage deep neck abscess      Hidradenitis - 3-4 times at [facility] and [place]  . Incision and drainage abscess Right 12/15/2013    Procedure: INCISION AND DRAINAGE Right Posterior neck ABSCESS;  Surgeon: Cherylynn Ridges, MD;  Location: [facility] OR;  Service: General;  Laterality: Right;   No family history on file. History  Substance Use Topics  . Smoking status: Former Smoker -- 1.00 packs/day    Quit date: 11/27/2013  . Smokeless tobacco: Never Used  . Alcohol Use: 0.6 oz/week    1 Cans of beer per week     Comment: social    Review of Systems  Constitutional: Negative for fever and activity change.  Respiratory: Negative.   Gastrointestinal: Negative.   Musculoskeletal:       As per history of present illness  Neurological: Negative.     Allergies  Review of patient's allergies indicates no known allergies.  Home Medications   Prior to Admission medications   Medication Sig Start Date End Date Taking? Authorizing Provider  clindamycin (CLEOCIN) 150 MG capsule Take 3 capsules (450 mg total) by mouth 3 (three) times daily. 01/10/14   Mellody Drown, PA-C  clindamycin (CLEOCIN) 300 MG capsule Take 1 capsule (300 mg total) by mouth 3 (three) times daily. 12/17/13   Shanker Levora Dredge, MD  famotidine (PEPCID) 20 MG tablet Take 20 mg by mouth 2 (two) times daily as needed for heartburn or indigestion.    Historical Provider, MD  ibuprofen (ADVIL,MOTRIN) 800 MG tablet Take 1 tablet (800 mg total) by mouth 3 (three) times daily. 01/10/14   Mellody Drown, PA-C  oxyCODONE (OXY IR/ROXICODONE) 5 MG immediate release tablet Take 1-2 tablets (5-10 mg total) by mouth every 4 (four) hours as needed for moderate pain. 12/27/13   Jimmye Norman, MD  oxyCODONE-acetaminophen (PERCOCET/ROXICET) 5-325 MG per tablet Take 1 tablet by mouth every 4 (four) hours as needed for severe pain. 01/10/14   Lauren Parker, PA-C   BP 142/86 mmHg  Pulse 98  Temp(Src) 98.1 F (36.7 C) (Oral)  Resp 18  SpO2 98% Physical Exam  Constitutional: He is oriented to person, place, and time. He appears well-developed and well-nourished. No distress.  Neck: Normal range of motion. Neck supple.  Pulmonary/Chest: Effort normal and breath sounds normal. No respiratory distress.  Musculoskeletal:  Left great toe with swelling, pallor, erythema. The nail is thick yellow mycotic and minimally loosened. No other open areas within the scan is seen.  Neurological: He is alert and oriented to person, place, and time. He exhibits normal muscle tone.  Skin: Skin is warm and dry.  Psychiatric: He has a normal mood and affect.  Nursing note and vitals reviewed.   ED  Course  Procedures (including critical care time) Labs Review Labs Reviewed - No data to display  Imaging Review Dg Toe Great Left  11/03/2014   CLINICAL DATA:  Acute left great toe injury after heavy object fell on it. Initial encounter.  EXAM: LEFT GREAT TOE  COMPARISON:  None.  FINDINGS: Mildly displaced and comminuted fracture is seen involving the first distal phalanx. Surrounding soft tissue swelling is noted. This appears to be closed and posttraumatic. Mild intra-articular extension is noted.  IMPRESSION: Mildly displaced comminuted fracture  involving the first distal phalanx.   Electronically Signed   By: Lupita Raider, M.D.   On: 11/03/2014 16:42     MDM   1. Toe fracture, left, closed, initial encounter   2. Contusion of great toe, left, initial encounter    Wound cleaning procedure to the left great toe. Bacitracin and wound dressing. Buddy tape toes. Hard sole shoe/boot Norco 5 mg #2 by mouth now plus ibuprofen 800 mg by mouth now Norco 7.5 mg #20 for pain Follow-up with orthopedist next week. Keep wound clean and dry and watch for infection.       Hayden Rasmussen, NP 11/03/14 1719

## 2014-11-03 NOTE — Discharge Instructions (Signed)
Buddy Taping of Toes We have taped your toes together to keep them from moving. This is called "buddy taping" since we used a part of your own body to keep the injured part still. We placed soft padding between your toes to keep them from rubbing against each other. Buddy taping will help with healing and to reduce pain. Keep your toes buddy taped together for as long as directed by your caregiver. HOME CARE INSTRUCTIONS   Raise your injured area above the level of your heart while sitting or lying down. Prop it up with pillows.  An ice pack used every twenty minutes, while awake, for the first one to two days may be helpful. Put ice in a plastic bag and put a towel between the bag and your skin.  Watch for signs that the taping is too tight. These signs may be:  Numbness of your taped toes.  Coolness of your taped toes.  Color change in the area beyond the tape.  Increased pain.  If you have any of these signs, loosen or rewrap the tape. If you need to loosen or rewrap the buddy tape, make sure you use the padding again. SEEK IMMEDIATE MEDICAL CARE IF:   You have worse pain, swelling, inflammation (soreness), drainage or bleeding after you rewrap the tape.  Any new problems occur. MAKE SURE YOU:   Understand these instructions.  Will watch your condition.  Will get help right away if you are not doing well or get worse. Document Released: 02/07/2004 Document Revised: 07/28/2011 Document Reviewed: 05/02/2008 [facility] Patient Information 2015 [place], [place]. This information is not intended to replace advice given to you by your health care provider. Make sure you discuss any questions you have with your health care provider.  Contusion A contusion is a deep bruise. Contusions are the result of an injury that caused bleeding under the skin. The contusion may turn blue, purple, or yellow. Minor injuries will give you a painless contusion, but more severe contusions may stay painful  and swollen for a few weeks.  CAUSES  A contusion is usually caused by a blow, trauma, or direct force to an area of the body. SYMPTOMS   Swelling and redness of the injured area.  Bruising of the injured area.  Tenderness and soreness of the injured area.  Pain. DIAGNOSIS  The diagnosis can be made by taking a history and physical exam. An X-ray, CT scan, or MRI may be needed to determine if there were any associated injuries, such as fractures. TREATMENT  Specific treatment will depend on what area of the body was injured. In general, the best treatment for a contusion is resting, icing, elevating, and applying cold compresses to the injured area. Over-the-counter medicines may also be recommended for pain control. Ask your caregiver what the best treatment is for your contusion. HOME CARE INSTRUCTIONS   Put ice on the injured area.  Put ice in a plastic bag.  Place a towel between your skin and the bag.  Leave the ice on for 15-20 minutes, 3-4 times a day, or as directed by your health care provider.  Only take over-the-counter or prescription medicines for pain, discomfort, or fever as directed by your caregiver. Your caregiver may recommend avoiding anti-inflammatory medicines (aspirin, ibuprofen, and naproxen) for 48 hours because these medicines may increase bruising.  Rest the injured area.  If possible, elevate the injured area to reduce swelling. SEEK IMMEDIATE MEDICAL CARE IF:   You have increased bruising or  swelling.  You have pain that is getting worse.  Your swelling or pain is not relieved with medicines. MAKE SURE YOU:   Understand these instructions.  Will watch your condition.  Will get help right away if you are not doing well or get worse. Document Released: 02/12/2005 Document Revised: 05/10/2013 Document Reviewed: 03/10/2011 [facility] Patient Information 2015 [place], [place]. This information is not intended to replace advice given to you by your  health care provider. Make sure you discuss any questions you have with your health care provider.  Toe Fracture Your caregiver has diagnosed you as having a fractured toe. A toe fracture is a break in the bone of a toe. "Buddy taping" is a way of splinting your broken toe, by taping the broken toe to the toe next to it. This "buddy taping" will keep the injured toe from moving beyond normal range of motion. Buddy taping also helps the toe heal in a more normal alignment. It may take 6 to 8 weeks for the toe injury to heal. HOME CARE INSTRUCTIONS   Leave your toes taped together for as long as directed by your caregiver or until you see a doctor for a follow-up examination. You can change the tape after bathing. Always use a small piece of gauze or cotton between the toes when taping them together. This will help the skin stay dry and prevent infection.  Apply ice to the injury for 15-20 minutes each hour while awake for the first 2 days. Put the ice in a plastic bag and place a towel between the bag of ice and your skin.  After the first 2 days, apply heat to the injured area. Use heat for the next 2 to 3 days. Place a heating pad on the foot or soak the foot in warm water as directed by your caregiver.  Keep your foot elevated as much as possible to lessen swelling.  Wear sturdy, supportive shoes. The shoes should not pinch the toes or fit tightly against the toes.  Your caregiver may prescribe a rigid shoe if your foot is very swollen.  Your may be given crutches if the pain is too great and it hurts too much to walk.  Only take over-the-counter or prescription medicines for pain, discomfort, or fever as directed by your caregiver.  If your caregiver has given you a follow-up appointment, it is very important to keep that appointment. Not keeping the appointment could result in a chronic or permanent injury, pain, and disability. If there is any problem keeping the appointment, you must call  back to this facility for assistance. SEEK MEDICAL CARE IF:   You have increased pain or swelling, not relieved with medications.  The pain does not get better after 1 week.  Your injured toe is cold when the others are warm. SEEK IMMEDIATE MEDICAL CARE IF:   The toe becomes cold, numb, or white.  The toe becomes hot (inflamed) and red. Document Released: 05/02/2000 Document Revised: 07/28/2011 Document Reviewed: 12/20/2007 [facility] Patient Information 2015 [place], [place]. This information is not intended to replace advice given to you by your health care provider. Make sure you discuss any questions you have with your health care provider.

## 2014-11-08 ENCOUNTER — Encounter (HOSPITAL_COMMUNITY): Payer: Self-pay | Admitting: Emergency Medicine

## 2014-11-08 ENCOUNTER — Emergency Department (HOSPITAL_COMMUNITY)
Admission: EM | Admit: 2014-11-08 | Discharge: 2014-11-08 | Disposition: A | Discharge status: Home / Self Care | Payer: 59 | Source: Home / Self Care | Attending: Family Medicine | Admitting: Family Medicine

## 2014-11-08 DIAGNOSIS — S92912S Unspecified fracture of left toe(s), sequela: Secondary | ICD-10-CM | POA: Diagnosis not present

## 2014-11-08 MED ORDER — HYDROCODONE-ACETAMINOPHEN 7.5-325 MG PO TABS: 1.0000 | ORAL_TABLET | Freq: Three times a day (TID) | ORAL | Status: DC | PRN

## 2014-11-08 NOTE — ED Notes (Signed)
Pt states that his toe is still in a lot of pain and it is still bleeding.  Pt says the bandage is not saturated, there is only blood on the inside.  He is out of pain medication and his appointment with the orthopedist is not until next Tuesday.  They have some questions about how to care for the toe at home until then.

## 2014-11-08 NOTE — ED Provider Notes (Signed)
Albert White is a 39 y.o. male who presents to Urgent Care today for left toe pain. Patient was seen last week for a comminuted mildly displaced fracture of the distal portion of his left great toe. He has an appointment in 6 days with orthopedics. He's been using hydrocodone for pain control. He states that ibuprofen is only mildly helpful. He additionally uses a postoperative shoe which helps some. He is here for recheck and reevaluation. He states the pain is stable to slightly improved. He denies any fevers or chills or discharge.   Past Medical History  Diagnosis Date  . Abscess   . Hidradenitis suppurativa   . Morbid obesity   . Vertigo    Past Surgical History  Procedure Laterality Date  . Incision and drainage deep neck abscess      Hidradenitis - 3-4 times at [facility] and [place]  . Incision and drainage abscess Right 12/15/2013    Procedure: INCISION AND DRAINAGE Right Posterior neck ABSCESS;  Surgeon: Cherylynn Ridges, MD;  Location: [facility] OR;  Service: General;  Laterality: Right;   History  Substance Use Topics  . Smoking status: Former Smoker -- 1.00 packs/day    Quit date: 11/27/2013  . Smokeless tobacco: Never Used  . Alcohol Use: 0.6 oz/week    1 Cans of beer per week     Comment: social   ROS as above Medications: No current facility-administered medications for this encounter.   Current Outpatient Prescriptions  Medication Sig Dispense Refill  . cephALEXin (KEFLEX) 500 MG capsule Take 1 capsule (500 mg total) by mouth 4 (four) times daily. 28 capsule 0  . clindamycin (CLEOCIN) 150 MG capsule Take 3 capsules (450 mg total) by mouth 3 (three) times daily. 63 capsule 0  . clindamycin (CLEOCIN) 300 MG capsule Take 1 capsule (300 mg total) by mouth 3 (three) times daily. 21 capsule 0  . famotidine (PEPCID) 20 MG tablet Take 20 mg by mouth 2 (two) times daily as needed for heartburn or indigestion.    Marland Kitchen HYDROcodone-acetaminophen (NORCO) 7.5-325 MG per tablet Take 1 tablet by  mouth every 8 (eight) hours as needed. 30 tablet 0  . ibuprofen (ADVIL,MOTRIN) 800 MG tablet Take 1 tablet (800 mg total) by mouth 3 (three) times daily. 21 tablet 0   No Known Allergies   Exam:  BP 150/106 mmHg  Pulse 88  Temp(Src) 97.6 F (36.4 C)  SpO2 95% Gen: Well NAD HEENT: EOMI,  MMM Left great toe ecchymosis present. No significant erythema. No active bleeding or expressible pus. Capillary refill and sensation is intact distally. Exts: Brisk capillary refill, warm and well perfused.   No results found for this or any previous visit (from the past 24 hour(s)). No results found.  Assessment and Plan: 39 y.o. male with well-appearing healing toe fracture. Refill hydrocodone. Follow-up with orthopedics in 6 days. Continue postoperative shoe.  Discussed warning signs or symptoms. Please see discharge instructions. Patient expresses understanding.     Rodolph Bong, MD 11/08/14 [id number]

## 2014-11-08 NOTE — Discharge Instructions (Signed)
Thank you for coming in today. Continue the rigid sole shoe . Continue antibiotics.  Follow up with your doctor.  Return as needed.   Toe Fracture A toe fracture is a break in the bone of a toe. It may take 6 to 8 weeks for the toe injury to heal. HOME CARE  "Buddy taping" is taping the broken toe to the toe next to it. Leave the toes taped together for at least 1 week or as told by your doctor. Change the tape after bathing. Always use a small piece of gauze or cotton between the toes when taping them together.  Put ice on the injured area.  Put ice in a plastic bag.  Place a towel between your skin and the bag.  Leave the ice on for 15-20 minutes, 03-04 times a day.  After the first 2 days, put heat on the injured area. Use heat for the next 2 to 3 days. Put a heating pad on the foot or soak the foot in warm water as told by your doctor.  Keep the foot raised (elevated) above the level of your heart.  Wear sturdy, supportive shoes. The shoes should not pinch the toes or fit tightly against the toes.  Use a cast shoe (if prescribed) if the foot is very puffy (swollen).  Use crutches if you have pain or it hurts too much to walk.  Only take medicine as told by your doctor.  Follow up with your doctor as told. GET HELP RIGHT AWAY IF:   There is pain or puffiness that is not helped by medicine.  The pain does not get better after 1 week.  The toe is cold when the others are warm.  The toe loses feeling (numb) or turns white.  The toe becomes hot and red (inflamed). MAKE SURE YOU:   Understand these instructions.  Will watch this condition.  Will get help right away if you are not doing well or get worse. Document Released: 10/22/2007 Document Revised: 07/28/2011 Document Reviewed: 09/28/2009 [facility] Patient Information 2015 [place], [place]. This information is not intended to replace advice given to you by your health care provider. Make sure you discuss any  questions you have with your health care provider.

## 2015-04-16 ENCOUNTER — Encounter (HOSPITAL_COMMUNITY): Payer: Self-pay | Admitting: *Deleted

## 2015-04-16 ENCOUNTER — Emergency Department (HOSPITAL_COMMUNITY)
Admission: EM | Admit: 2015-04-16 | Discharge: 2015-04-16 | Disposition: A | Discharge status: Home / Self Care | Payer: 59 | Attending: Emergency Medicine | Admitting: Emergency Medicine

## 2015-04-16 ENCOUNTER — Emergency Department (HOSPITAL_COMMUNITY): Payer: 59

## 2015-04-16 DIAGNOSIS — N492 Inflammatory disorders of scrotum: Secondary | ICD-10-CM | POA: Diagnosis not present

## 2015-04-16 DIAGNOSIS — Z87891 Personal history of nicotine dependence: Secondary | ICD-10-CM | POA: Insufficient documentation

## 2015-04-16 DIAGNOSIS — Z791 Long term (current) use of non-steroidal anti-inflammatories (NSAID): Secondary | ICD-10-CM | POA: Insufficient documentation

## 2015-04-16 DIAGNOSIS — R739 Hyperglycemia, unspecified: Secondary | ICD-10-CM | POA: Diagnosis not present

## 2015-04-16 DIAGNOSIS — Z792 Long term (current) use of antibiotics: Secondary | ICD-10-CM | POA: Diagnosis not present

## 2015-04-16 DIAGNOSIS — Z8579 Personal history of other malignant neoplasms of lymphoid, hematopoietic and related tissues: Secondary | ICD-10-CM | POA: Insufficient documentation

## 2015-04-16 DIAGNOSIS — Z872 Personal history of diseases of the skin and subcutaneous tissue: Secondary | ICD-10-CM | POA: Diagnosis not present

## 2015-04-16 DIAGNOSIS — N5089 Other specified disorders of the male genital organs: Secondary | ICD-10-CM | POA: Diagnosis present

## 2015-04-16 LAB — I-STAT CHEM 8, ED
BUN: 11 mg/dL (ref 6–20)
CALCIUM ION: 1.12 mmol/L (ref 1.12–1.23)
Chloride: 102 mmol/L (ref 101–111)
Creatinine, Ser: 0.7 mg/dL (ref 0.61–1.24)
GLUCOSE: 249 mg/dL — AB (ref 65–99)
HCT: 50 % (ref 39.0–52.0)
Hemoglobin: 17 g/dL (ref 13.0–17.0)
POTASSIUM: 3.9 mmol/L (ref 3.5–5.1)
Sodium: 139 mmol/L (ref 135–145)
TCO2: 20 mmol/L (ref 0–100)

## 2015-04-16 LAB — CBC WITH DIFFERENTIAL/PLATELET
Basophils Absolute: 0 10*3/uL (ref 0.0–0.1)
Basophils Relative: 0 %
Eosinophils Absolute: 0.2 10*3/uL (ref 0.0–0.7)
Eosinophils Relative: 2 %
HCT: 44.6 % (ref 39.0–52.0)
HEMOGLOBIN: 15.2 g/dL (ref 13.0–17.0)
LYMPHS ABS: 2.3 10*3/uL (ref 0.7–4.0)
LYMPHS PCT: 28 %
MCH: 29.8 pg (ref 26.0–34.0)
MCHC: 34.1 g/dL (ref 30.0–36.0)
MCV: 87.5 fL (ref 78.0–100.0)
MONOS PCT: 7 %
Monocytes Absolute: 0.6 10*3/uL (ref 0.1–1.0)
Neutro Abs: 5.1 10*3/uL (ref 1.7–7.7)
Neutrophils Relative %: 63 %
Platelets: 188 10*3/uL (ref 150–400)
RBC: 5.1 MIL/uL (ref 4.22–5.81)
RDW: 12.8 % (ref 11.5–15.5)
WBC: 8.1 10*3/uL (ref 4.0–10.5)

## 2015-04-16 LAB — URINE MICROSCOPIC-ADD ON

## 2015-04-16 LAB — URINALYSIS, ROUTINE W REFLEX MICROSCOPIC
Bilirubin Urine: NEGATIVE
Glucose, UA: NEGATIVE mg/dL
HGB URINE DIPSTICK: NEGATIVE
Ketones, ur: NEGATIVE mg/dL
Nitrite: NEGATIVE
PROTEIN: NEGATIVE mg/dL
Specific Gravity, Urine: 1.023 (ref 1.005–1.030)
pH: 6 (ref 5.0–8.0)

## 2015-04-16 MED ORDER — SODIUM CHLORIDE 0.9 % IV BOLUS (SEPSIS)
1000.0000 mL | Freq: Once | INTRAVENOUS | Status: AC
  Administered 2015-04-16: 1000 mL via INTRAVENOUS

## 2015-04-16 MED ORDER — HYDROMORPHONE HCL 1 MG/ML IJ SOLN
1.0000 mg | Freq: Once | INTRAMUSCULAR | Status: AC
  Administered 2015-04-16: 1 mg via INTRAVENOUS
  Filled 2015-04-16: qty 1

## 2015-04-16 MED ORDER — OXYCODONE-ACETAMINOPHEN 5-325 MG PO TABS
1.0000 | ORAL_TABLET | Freq: Once | ORAL | Status: AC
  Administered 2015-04-16: 1 via ORAL
  Filled 2015-04-16: qty 1

## 2015-04-16 MED ORDER — OXYCODONE-ACETAMINOPHEN 5-325 MG PO TABS: 1.0000 | ORAL_TABLET | ORAL | Status: DC | PRN

## 2015-04-16 MED ORDER — CLINDAMYCIN HCL 300 MG PO CAPS: 300.0000 mg | ORAL_CAPSULE | Freq: Four times a day (QID) | ORAL | Status: DC

## 2015-04-16 MED ORDER — IOHEXOL 300 MG/ML  SOLN
100.0000 mL | Freq: Once | INTRAMUSCULAR | Status: AC | PRN
  Administered 2015-04-16: 100 mL via INTRAVENOUS

## 2015-04-16 MED ORDER — CLINDAMYCIN PHOSPHATE 600 MG/50ML IV SOLN
600.0000 mg | Freq: Once | INTRAVENOUS | Status: AC
  Administered 2015-04-16: 600 mg via INTRAVENOUS
  Filled 2015-04-16: qty 50

## 2015-04-16 NOTE — ED Provider Notes (Signed)
CSN: [id number]     Arrival date & time 04/16/15  1413 History  By signing my name below, I, Evelene Croon, attest that this documentation has been prepared under the direction and in the presence of non-physician practitioner, Clayton Bibles, PA-C. Electronically Signed: Evelene Croon, Scribe. 04/16/2015. 6:10 PM.   Chief Complaint  Patient presents with  . Groin Swelling    The history is provided by the patient. No language interpreter was used.    HPI Comments:  Albert White is a 38 y.o. male who presents to the Emergency Department complaining of scrotal swelling which began today with associated redness and pain, which he first noticed yesterday. He reports associated nausea, chills, frequent urination and malodorous urine. He has had the malodorous urine x 1 week, began having chills/hot flashes 3 days ago, developed pain yesterday, and swelling today.  The pain is located midline in the scrotum, has never been unilateral. Pt denies  vomiting, penile discharge, dysuria, abdominal pain, changes in BMs, rectal pain.  No hx prostate problems.  No alleviating factors noted.  He has health insurance but does not have a PCP, is working on it.  Denies risk factors for STDs.    Past Medical History  Diagnosis Date  . Abscess   . Hidradenitis suppurativa   . Morbid obesity ([place])   . Vertigo   . Multiple myeloma [facility])    Past Surgical History  Procedure Laterality Date  . Incision and drainage deep neck abscess      Hidradenitis - 3-4 times at [facility] and [place]  . Incision and drainage abscess Right 12/15/2013    Procedure: INCISION AND DRAINAGE Right Posterior neck ABSCESS;  Surgeon: Gwenyth Ober, MD;  Location: [facility] [place];  Service: General;  Laterality: Right;   History reviewed. No pertinent family history. Social History  Substance Use Topics  . Smoking status: Former Smoker -- 1.00 packs/day    Quit date: 11/27/2013  . Smokeless tobacco: Never Used  . Alcohol Use: 0.6 oz/week    1  Cans of beer per week     Comment: social    Review of Systems  Constitutional: Positive for chills. Negative for fever.  Respiratory: Negative for shortness of breath.   Cardiovascular: Negative for chest pain.  Genitourinary: Positive for frequency and scrotal swelling. Negative for dysuria.  All other systems reviewed and are negative.   Allergies  Review of patient's allergies indicates no known allergies.  Home Medications   Prior to Admission medications   Medication Sig Start Date End Date Taking? Authorizing Provider  cephALEXin (KEFLEX) 500 MG capsule Take 1 capsule (500 mg total) by mouth 4 (four) times daily. 11/03/14   Janne Napoleon, NP  clindamycin (CLEOCIN) 150 MG capsule Take 3 capsules (450 mg total) by mouth 3 (three) times daily. 01/10/14   Harvie Heck, PA-C  clindamycin (CLEOCIN) 300 MG capsule Take 1 capsule (300 mg total) by mouth 3 (three) times daily. 12/17/13   Shanker Kristeen Mans, MD  famotidine (PEPCID) 20 MG tablet Take 20 mg by mouth 2 (two) times daily as needed for heartburn or indigestion.    Historical Provider, MD  HYDROcodone-acetaminophen (NORCO) 7.5-325 MG per tablet Take 1 tablet by mouth every 8 (eight) hours as needed. 11/08/14   Gregor Hams, MD  ibuprofen (ADVIL,MOTRIN) 800 MG tablet Take 1 tablet (800 mg total) by mouth 3 (three) times daily. 01/10/14   Harvie Heck, PA-C   BP 135/92 mmHg  Pulse 115  Temp(Src) 98.7  F (37.1 C) (Oral)  Resp 22  SpO2 96% Physical Exam  Constitutional: He appears well-developed and well-nourished. No distress.  HENT:  Head: Normocephalic and atraumatic.  Neck: Neck supple.  Pulmonary/Chest: Effort normal.  Abdominal: Soft. He exhibits no distension. There is no tenderness. There is no rebound and no guarding.  Genitourinary:  Scrotum with diffuse edema, erythema, warmth, mild induration, no fluctuance.  Penis not visible secondary to edema.  Please see photographic image below for further illustration.   Chaperone (scribe) was present for exam which was performed with no discomfort or complications.   Neurological: He is alert.  Skin: He is not diaphoretic.  Nursing note and vitals reviewed.     ED Course  Procedures   DIAGNOSTIC STUDIES:  Oxygen Saturation is 96% on RA, normal by my interpretation.    COORDINATION OF CARE:  5:50 PM Discussed treatment plan with pt at bedside and pt agreed to plan.  Labs Review Labs Reviewed  URINALYSIS, ROUTINE W REFLEX MICROSCOPIC (NOT AT [facility]) - Abnormal; Notable for the following:    Leukocytes, UA SMALL (*)    All other components within normal limits  URINE MICROSCOPIC-ADD ON - Abnormal; Notable for the following:    Squamous Epithelial / LPF 0-5 (*)    Bacteria, UA RARE (*)    All other components within normal limits  I-STAT CHEM 8, ED - Abnormal; Notable for the following:    Glucose, Bld 249 (*)    All other components within normal limits  CBC WITH DIFFERENTIAL/PLATELET    Imaging Review Ct Pelvis W Contrast  04/16/2015  CLINICAL DATA:  Scrotal pain and swelling starting this morning. Rule out soft tissue gas in the scrotum. Morbid obesity. EXAM: CT PELVIS WITH CONTRAST TECHNIQUE: Multidetector CT imaging of the pelvis was performed using the standard protocol following the bolus administration of intravenous contrast. CONTRAST:  140mL OMNIPAQUE IOHEXOL 300 MG/ML  SOLN COMPARISON:  07/29/2011 FINDINGS: Normal pelvic bowel loops, including the appendix. Age advanced atherosclerosis including within both common iliac arteries. A right external iliac node measures 10 mm on image 32/series 401. This measured 9 mm back in 2013, favoring a reactive etiology. A left inguinal node measures 13 mm on image 47/series 401. Compare 12 mm on the prior. Normal urinary bladder and prostate, without significant free pelvic fluid. There is edema or ill-defined fluid throughout the scrotal skin. Example image 82/series 401. No evidence of gas within  the skin or surrounding subcutaneous tissues. No drainable fluid collection or abnormal enhancement. The testicles are grossly normal, but suboptimally evaluated by CT. No acute osseous abnormality. Degenerative disc disease involves L4-5 and L5-S1. IMPRESSION: 1. Diffuse edema/fluid throughout the scrotum. Favored to represent cellulitis. Alternate explanations, including dependent edema from fluid overload, felt less likely given absence of pelvic ascites. No evidence of scrotal gas or drainable abscess. 2. Age advanced atherosclerosis. 3. Borderline to mild pelvic adenopathy, favored to be reactive given minimal change since 02/28/2012. Electronically Signed   By: Abigail Miyamoto M.D.   On: 04/16/2015 20:13   I have personally reviewed and evaluated these images and lab results as part of my medical decision-making.   6:11 PM Discussed pt with Dr Alfonse Spruce who will also see the patient, I also spoke with radiologist Dr Mariea Clonts regarding imaging.  6:59 PM I spoke with Dr Jeffie Pollock regarding this patient.  We have discussed his presentation, the lab results, our plan for evaluation and treatment, and he has seen the photographic image in the chart  during our discussion.  He is available for any concerning results following the CT scan. Nothing additional to add to or change about the workup at this time.    9:03 PM Discussed findings and treatment plan with Dr Alfonse Spruce.  Patient and spouse updated.  Pt to return or see PCP in 2 days, sooner with worsening symptoms.  Pt advised of his high blood sugars and will need close follow up - per discussion with Dr Alfonse Spruce, will defer starting DM medications to PCP.   MDM   Final diagnoses:  Cellulitis, scrotum  Hyperglycemia    Afebrile, nontoxic patient with diffuse scrotal edema, warmth, erythema c/w cellulitis.  Symptoms with slowly increasing over several days with associated symptoms of chills/hot flashes, malodorous urine, suspect infectious etiology.  Doubt  torsion.   D/C home with clindamycin, percocet, close ED vs PCP follow up.   Discussed result, findings, treatment, and follow up  with patient.  Pt given return precautions.  Pt verbalizes understanding and agrees with plan.        I personally performed the services described in this documentation, which was scribed in my presence. The recorded information has been reviewed and is accurate.    Clayton Bibles, PA-C 04/16/15 2108  Harvel Quale, MD 04/20/15 [phone]

## 2015-04-16 NOTE — Discharge Instructions (Signed)
Read the information below.  Use the prescribed medication as directed.  Please discuss all new medications with your pharmacist.  Do not take additional tylenol while taking the prescribed pain medication to avoid overdose.  You may return to the Emergency Department at any time for worsening condition or any new symptoms that concern you.    If you develop increased redness, increased swelling, pus draining from the wound, uncontrolled pain, or fevers greater than 100.4, return to the ER immediately for a recheck.

## 2015-04-16 NOTE — ED Notes (Signed)
Family member reports foul smelling urine x 1 week. Pt reports pain, swelling and heat to scrotum today. Denies fever.

## 2015-04-17 ENCOUNTER — Encounter (HOSPITAL_BASED_OUTPATIENT_CLINIC_OR_DEPARTMENT_OTHER): Payer: Self-pay

## 2015-04-17 ENCOUNTER — Emergency Department (HOSPITAL_BASED_OUTPATIENT_CLINIC_OR_DEPARTMENT_OTHER)
Admission: EM | Admit: 2015-04-17 | Discharge: 2015-04-17 | Disposition: A | Discharge status: Home / Self Care | Payer: 59 | Attending: Emergency Medicine | Admitting: Emergency Medicine

## 2015-04-17 DIAGNOSIS — Z87891 Personal history of nicotine dependence: Secondary | ICD-10-CM | POA: Insufficient documentation

## 2015-04-17 DIAGNOSIS — N492 Inflammatory disorders of scrotum: Secondary | ICD-10-CM

## 2015-04-17 DIAGNOSIS — Z872 Personal history of diseases of the skin and subcutaneous tissue: Secondary | ICD-10-CM | POA: Diagnosis not present

## 2015-04-17 DIAGNOSIS — N50819 Testicular pain, unspecified: Secondary | ICD-10-CM | POA: Diagnosis present

## 2015-04-17 LAB — CBG MONITORING, ED: Glucose-Capillary: 226 mg/dL — ABNORMAL HIGH (ref 65–99)

## 2015-04-17 MED ORDER — FUROSEMIDE 20 MG PO TABS
20.0000 mg | ORAL_TABLET | Freq: Two times a day (BID) | ORAL | Status: DC
Start: 2015-04-17 — End: 2015-11-08

## 2015-04-17 NOTE — ED Notes (Signed)
Testicle pain x 2 days-swelling started yesterday-was seen at [facility] ED dx with cellulitis-in today for clear drainage from scrotum

## 2015-04-17 NOTE — Discharge Instructions (Signed)
Lasix as prescribed.  Continue your clindamycin as prescribed.  Follow-up with [facility] in the next few days to be rechecked. The contact information has been provided in this discharge summary for you to call and arrange this appointment.

## 2015-04-17 NOTE — ED Provider Notes (Signed)
CSN: [id number]     Arrival date & time 04/17/15  2111 History  By signing my name below, I, Albert White, attest that this documentation has been prepared under the direction and in the presence of Geoffery Lyons, MD. Electronically Signed: [name], ED Scribe. 04/17/2015. 9:37 PM.  Chief Complaint  Patient presents with  . Cellulitis   Patient is a 39 y.o. male presenting with testicular pain. The history is provided by the patient. No language interpreter was used.  Testicle Pain This is a new problem. The current episode started 2 days ago. The problem occurs constantly. The problem has been gradually worsening. Treatments tried: anti-biotics. The treatment provided no relief.  HPI Comments: NIL Albert White is a 39 y.o. Male with a hx of abscess and hidradenitis suppurativa who presents to the Emergency Department complaining of midline scrotal swelling and testicular pain onset 2 days ago. Pt states he was seen in the ED yesterday for the same symptoms and was diagnosed with cellulitis; a CT scan was performed and he was prescribed clindamycin. He states that he is now having clear drainage from his scrotum that will soak through his underwear. He reports that it is seeping and there does not seem to be any particular exit point. He denies use of fluid pills, fever, chills, leg swelling, nausea, and vomiting. Pt states that he is supposed to have his CBG rechecked tomorrow because it was over 200 in the ED yesterday.   Past Medical History  Diagnosis Date  . Abscess   . Hidradenitis suppurativa   . Morbid obesity ([facility])   . Vertigo    Past Surgical History  Procedure Laterality Date  . Incision and drainage deep neck abscess      Hidradenitis - 3-4 times at [facility] and [place]  . Incision and drainage abscess Right 12/15/2013    Procedure: INCISION AND DRAINAGE Right Posterior neck ABSCESS;  Surgeon: Cherylynn Ridges, MD;  Location: [facility] OR;  Service: General;  Laterality: Right;   No  family history on file. Social History  Substance Use Topics  . Smoking status: Former Smoker -- 1.00 packs/day    Quit date: 11/27/2013  . Smokeless tobacco: Never Used  . Alcohol Use: 0.6 oz/week    1 Cans of beer per week     Comment: social    Review of Systems  Constitutional: Negative for fever and chills.  Cardiovascular: Negative for leg swelling.  Gastrointestinal: Negative for nausea and vomiting.  Genitourinary: Positive for scrotal swelling and testicular pain.  All other systems reviewed and are negative.  Allergies  Review of patient's allergies indicates no known allergies.  Home Medications   Prior to Admission medications   Medication Sig Start Date End Date Taking? Authorizing Provider  clindamycin (CLEOCIN) 300 MG capsule Take 1 capsule (300 mg total) by mouth 4 (four) times daily. X 10 days 04/16/15   Trixie Dredge, PA-C  ibuprofen (ADVIL,MOTRIN) 800 MG tablet Take 1 tablet (800 mg total) by mouth 3 (three) times daily. Patient not taking: Reported on 04/16/2015 01/10/14   Mellody Drown, PA-C  oxyCODONE-acetaminophen (PERCOCET/ROXICET) 5-325 MG tablet Take 1-2 tablets by mouth every 4 (four) hours as needed for moderate pain or severe pain. 04/16/15   Trixie Dredge, PA-C   BP 134/91 mmHg  Pulse 105  Temp(Src) 98.4 F (36.9 C) (Oral)  Resp 20  Wt 386 lb (175.088 kg)  SpO2 97%  Physical Exam  Constitutional: He is oriented to person, place, and time. He  appears well-developed and well-nourished.  HENT:  Head: Normocephalic and atraumatic.  Eyes: EOM are normal.  Neck: Normal range of motion.  Cardiovascular: Normal rate, regular rhythm, normal heart sounds and intact distal pulses.   Pulmonary/Chest: Effort normal and breath sounds normal. No respiratory distress.  Abdominal: Soft. He exhibits no distension. There is no tenderness.  Genitourinary:  There is significant scrotal edema present. There is clear fluid weeping from the skin. There is no palpable  abscess or crepitus.   Musculoskeletal: Normal range of motion.  Neurological: He is alert and oriented to person, place, and time.  Skin: Skin is warm and dry.  Psychiatric: He has a normal mood and affect. Judgment normal.  Nursing note and vitals reviewed.   ED Course  Procedures (including critical care time) DIAGNOSTIC STUDIES: Oxygen Saturation is 97% on RA, normal by my interpretation.    COORDINATION OF CARE: 9:34 PM-Discussed treatment plan which includes continuing anti-biotics, fluid pill, and follow up with urology with pt at bedside and pt agreed to plan.   Labs Review Labs Reviewed - No data to display  Imaging Review Ct Pelvis W Contrast  04/16/2015  CLINICAL DATA:  Scrotal pain and swelling starting this morning. Rule out soft tissue gas in the scrotum. Morbid obesity. EXAM: CT PELVIS WITH CONTRAST TECHNIQUE: Multidetector CT imaging of the pelvis was performed using the standard protocol following the bolus administration of intravenous contrast. CONTRAST:  100mL OMNIPAQUE IOHEXOL 300 MG/ML  SOLN COMPARISON:  07/29/2011 FINDINGS: Normal pelvic bowel loops, including the appendix. Age advanced atherosclerosis including within both common iliac arteries. A right external iliac node measures 10 mm on image 32/series 401. This measured 9 mm back in 2013, favoring a reactive etiology. A left inguinal node measures 13 mm on image 47/series 401. Compare 12 mm on the prior. Normal urinary bladder and prostate, without significant free pelvic fluid. There is edema or ill-defined fluid throughout the scrotal skin. Example image 82/series 401. No evidence of gas within the skin or surrounding subcutaneous tissues. No drainable fluid collection or abnormal enhancement. The testicles are grossly normal, but suboptimally evaluated by CT. No acute osseous abnormality. Degenerative disc disease involves L4-5 and L5-S1. IMPRESSION: 1. Diffuse edema/fluid throughout the scrotum. Favored to  represent cellulitis. Alternate explanations, including dependent edema from fluid overload, felt less likely given absence of pelvic ascites. No evidence of scrotal gas or drainable abscess. 2. Age advanced atherosclerosis. 3. Borderline to mild pelvic adenopathy, favored to be reactive given minimal change since 02/28/2012. Electronically Signed   By: Jeronimo GreavesKyle  Talbot M.D.   On: 04/16/2015 20:13   I have personally reviewed and evaluated these images and lab results as part of my medical decision-making.   EKG Interpretation None      MDM   Final diagnoses:  None    Patient diagnosed yesterday with cellulitis of the scrotum. He is now having seepage of clear fluid from the inflamed area. On exam, his scrotum is swollen and mildly erythematous. It is not hot to the touch and there is no crepitus. There is nothing that is concerning for Fournier's gangrene. His CT scan yesterday shows only edema of the scrotal tissue. I suspect this swelling and drainage is related to dependent edema. He will be given Lasix and advised to continue the doxycycline. He will also be given the contact information for [facility] with whom he can range a follow-up appointment.  I personally performed the services described in this documentation, which was scribed in  my presence. The recorded information has been reviewed and is accurate.        Geoffery Lyons, MD 04/17/15 [phone]

## 2015-09-25 ENCOUNTER — Institutional Professional Consult (permissible substitution): Payer: 59 | Admitting: Neurology

## 2015-09-25 ENCOUNTER — Telehealth: Payer: Self-pay

## 2015-09-25 NOTE — Telephone Encounter (Signed)
Appt cancelled same day

## 2015-09-26 ENCOUNTER — Encounter: Payer: Self-pay | Admitting: Neurology

## 2015-11-08 ENCOUNTER — Encounter: Payer: Self-pay | Admitting: Neurology

## 2015-11-08 ENCOUNTER — Ambulatory Visit (INDEPENDENT_AMBULATORY_CARE_PROVIDER_SITE_OTHER): Payer: 59 | Admitting: Neurology

## 2015-11-08 VITALS — BP 136/84 | HR 60 | Resp 22 | Ht 71.0 in | Wt 388.0 lb

## 2015-11-08 DIAGNOSIS — G4761 Periodic limb movement disorder: Secondary | ICD-10-CM

## 2015-11-08 DIAGNOSIS — G4733 Obstructive sleep apnea (adult) (pediatric): Secondary | ICD-10-CM

## 2015-11-08 DIAGNOSIS — G2581 Restless legs syndrome: Secondary | ICD-10-CM | POA: Diagnosis not present

## 2015-11-08 DIAGNOSIS — R51 Headache: Secondary | ICD-10-CM | POA: Diagnosis not present

## 2015-11-08 DIAGNOSIS — G4710 Hypersomnia, unspecified: Secondary | ICD-10-CM

## 2015-11-08 DIAGNOSIS — R519 Headache, unspecified: Secondary | ICD-10-CM

## 2015-11-08 DIAGNOSIS — R351 Nocturia: Secondary | ICD-10-CM | POA: Diagnosis not present

## 2015-11-08 NOTE — Patient Instructions (Signed)
Based on your symptoms and your exam I believe you have obstructive sleep apnea or OSA, and I think we should proceed with a sleep study to determine whether how severe it is. If you have more than mild OSA, I want you to consider treatment with CPAP. Please remember, the risks and ramifications of moderate to severe obstructive sleep apnea or OSA are: Cardiovascular disease, including congestive heart failure, stroke, difficult to control hypertension, arrhythmias, and even type 2 diabetes has been linked to untreated OSA. Sleep apnea causes disruption of sleep and sleep deprivation in most cases, which, in turn, can cause recurrent headaches, problems with memory, mood, concentration, focus, and vigilance. Most people with untreated sleep apnea report excessive daytime sleepiness, which can affect their ability to drive. Please do not drive if you feel sleepy.   I will likely see you back after your sleep study to go over the test results and where to go from there. We will call you after your sleep study to advise about the results (most likely, you will hear from Lafonda Mossesiana, my nurse) and to set up an appointment at the time, as necessary.    Our sleep lab administrative assistant, Alvis LemmingsDawn will meet with you or call you to schedule your sleep study. If you don't hear back from her by next week please feel free to call her at [phone]. This is her direct line and please leave a message with your phone number to call back if you get the voicemail box. She will call back as soon as possible.

## 2015-11-08 NOTE — Progress Notes (Signed)
Subjective:    Patient ID: Albert White is a 40 y.o. male.  HPI     Albert FoleySaima Kaysee Hergert, MD, PhD [facility] [address], [address] [address] [place], [place] [id number]  Dear Dr. Link SnufferHolwerda,   I saw your patient, Webb SilversmithSteven White, upon your kind, in my neurologic clinic today for initial consultation of his sleep disorder, in particular, concern for underlying obstructive sleep apnea. The patient is unaccompanied today. Of note, the patient missed an appointment on 09/25/2015. As you know, [name] is a 40 year old right-handed gentleman with an underlying medical history of hyperlipidemia, diabetes, recurrent neck abscess, recent smoking cessation and morbid obesity, who reports snoring and excessive daytime somnolence, as well as waking up with a sense of gasping for air and a sense of panic. He has rare morning headaches and significant nocturia, on average 4 times per night. He does not wake up rested. Epworth sleepiness score is 24 out of 24 today, fatigue score is 48 out of 63. In addition, he has occasional restless leg symptoms and leg twitching is reported by wife to him as well. He is overall restless sleeper. He is not aware of any family history of OSA or restless leg syndrome. He works variable schedules, works in [employer]. He quit smoking in 2015 or 2016, drinks alcohol rarely, about once a month, and does not drink caffeine daily. He denies falling asleep at the wheel but has come very close to it he admits. He is strongly advised not to drive when feeling this sleepy. He has been gaining weight.  I reviewed your office note from 09/18/2015, which you kindly included. He had blood work through your office, which I reviewed: A1c on 09/12/15 was 6.8. LDL was 147. Bedtime is around 9 PM, wakeup time is around 4 AM. He lives with his wife, has no children, has a small dog.  His Past Medical History Is Significant For: Past Medical History  Diagnosis Date  .  Abscess   . Hidradenitis suppurativa   . Morbid obesity (HCC)   . Vertigo   . Diabetes mellitus without complication (HCC)   . Hyperlipemia     His Past Surgical History Is Significant For: Past Surgical History  Procedure Laterality Date  . Incision and drainage deep neck abscess      [facility] [place]  . Incision and drainage abscess Right 12/15/2013    Procedure: INCISION AND DRAINAGE Right Posterior neck ABSCESS;  Surgeon: Cherylynn RidgesJames O [name], MD;  [facility];  Service: General;  Laterality: Right;    His Family History Is Significant For: Family History  Problem Relation Age of Onset  . Arthritis Mother   . Hypertension Mother     His Social History Is Significant For: Social History   Social History  . Marital Status: Married    Spouse Name: N/A  . Number of Children: 0  . Years of Education: 7th   Occupational History  . Construction     Social History Main Topics  . Smoking status: Former Smoker -- 1.00 packs/day    Quit date: 11/27/2013  . Smokeless tobacco: Never Used  . Alcohol Use: 0.6 oz/week    1 Cans of beer per week     Comment: social  . Drug Use: No  . Sexual Activity: Not Asked   Other Topics Concern  . None   Social History Narrative   Drinks about 1 soda a day  His Allergies Are:  No Known Allergies:   His Current Medications Are:  Outpatient Encounter Prescriptions as of 11/08/2015  Medication Sig  . atorvastatin (LIPITOR) 10 MG tablet   . metFORMIN (GLUCOPHAGE) 500 MG tablet Take by mouth 2 (two) times daily with a meal.  . [DISCONTINUED] clindamycin (CLEOCIN) 300 MG capsule Take 1 capsule (300 mg total) by mouth 4 (four) times daily. X 10 days  . [DISCONTINUED] furosemide (LASIX) 20 MG tablet Take 1 tablet (20 mg total) by mouth 2 (two) times daily.  . [DISCONTINUED] ibuprofen (ADVIL,MOTRIN) 800 MG tablet Take 1 tablet (800 mg total) by mouth 3 (three) times daily. (Patient not taking: Reported on  04/16/2015)  . [DISCONTINUED] oxyCODONE-acetaminophen (PERCOCET/ROXICET) 5-325 MG tablet Take 1-2 tablets by mouth every 4 (four) hours as needed for moderate pain or severe pain.   No facility-administered encounter medications on file as of 11/08/2015.  :  Review of Systems:  Out of a complete 14 point review of systems, all are reviewed and negative with the exception of these symptoms as listed below:   Review of Systems  Neurological:       Patient says that he has trouble staying asleep, snoring, patient has awaken to choking and feeling like he was "drowning", wakes up feeling tired, daytime tiredness, sometimes takes naps.    Epworth Sleepiness Scale 0= would never doze 1= slight chance of dozing 2= moderate chance of dozing 3= high chance of dozing  Sitting and reading:3 Watching TV:3 Sitting inactive in a public place (ex. Theater or meeting):3 As a passenger in a car for an hour without a break:3 Lying down to rest in the afternoon:3 Sitting and talking to someone:3 Sitting quietly after lunch (no alcohol):3 In a car, while stopped in traffic:3 Total: 24  Objective:  Neurologic Exam  Physical Exam Physical Examination:   Filed Vitals:   11/08/15 0939  BP: 136/84  Pulse: 60  Resp: 22    General Examination: The patient is a very pleasant 40 y.o. male in no acute distress. He appears well-developed and well-nourished and adequately groomed.   HEENT: Normocephalic, atraumatic, pupils are equal, round and reactive to light and accommodation. Funduscopic exam is normal with sharp disc margins noted. Extraocular tracking is good without limitation to gaze excursion or nystagmus noted. Normal smooth pursuit is noted. Hearing is grossly intact. Tympanic membranes are clear bilaterally. Face is symmetric with normal facial animation and normal facial sensation. Speech is clear with no dysarthria noted. There is no hypophonia. There is no lip, neck/head, jaw or voice  tremor. Neck is supple with full range of passive and active motion. There are no carotid bruits on auscultation. Oropharynx exam reveals: moderate mouth dryness, adequate dental hygiene and marked airway crowding, due to large tongue, thick soft palate, large uvula, tonsils in place. Mallampati is class III. Tongue protrudes centrally and palate elevates symmetrically. Tonsils are 1+ in size. Neck size is 19 1/8 inches. He has a Absent overbite.   Chest: Clear to auscultation without wheezing, rhonchi or crackles noted.  Heart: S1+S2+0, regular and normal without murmurs, rubs or gallops noted.   Abdomen: Soft, non-tender and non-distended with normal bowel sounds appreciated on auscultation.  Extremities: There is no pitting edema in the distal lower extremities bilaterally. Pedal pulses are intact.  Skin: Warm and dry without trophic changes noted except dry skin and multiple tattoos, signs of chronic sun exposure on the forearms. There are no varicose veins.  Musculoskeletal: exam reveals no obvious  joint deformities, tenderness or joint swelling or erythema.   Neurologically:  Mental status: The patient is awake, alert and oriented in all 4 spheres. His immediate and remote memory, attention, language skills and fund of knowledge are appropriate. There is no evidence of aphasia, agnosia, apraxia or anomia. Speech is clear with normal prosody and enunciation. Thought process is linear. Mood is normal and affect is normal.  Cranial nerves II - XII are as described above under HEENT exam. In addition: shoulder shrug is normal with equal shoulder height noted. Motor exam: Normal bulk, strength and tone is noted. There is no drift, tremor or rebound. Romberg is negative. Reflexes are 1+ throughout. Fine motor skills and coordination: intact with normal finger taps, normal hand movements, normal rapid alternating patting, normal foot taps and normal foot agility.  Cerebellar testing: No dysmetria or  intention tremor on finger to nose testing. Heel to shin is unremarkable bilaterally. There is no truncal or gait ataxia.  Sensory exam: intact to light touch, pinprick, vibration, temperature sense in the upper and lower extremities.  Gait, station and balance: He stands easily. No veering to one side is noted. No leaning to one side is noted. Posture is age-appropriate and stance is narrow based. Gait shows normal stride length and normal pace. No problems turning are noted. Tandem walk is unremarkable.    Assessment and plan:  In summary, GLYNDON TURSI is a very pleasant 40 y.o.-year old male with an underlying medical history of hyperlipidemia, diabetes, recurrent neck abscess, recent smoking cessation and morbid obesity, whose history and physical exam are in keeping with obstructive sleep apnea (OSA). In addition, he reports symptoms in keeping with restless leg syndrome and leg twitching at night, PLMs. I had a long chat with the patient about my findings and the diagnosis of OSA, its prognosis and treatment options. We talked about medical treatments, surgical interventions and non-pharmacological approaches. I explained in particular the risks and ramifications of untreated moderate to severe OSA, especially with respect to developing cardiovascular disease down the Road, including congestive heart failure, difficult to treat hypertension, cardiac arrhythmias, or stroke. Even type 2 diabetes has, in part, been linked to untreated OSA. Symptoms of untreated OSA include daytime sleepiness, memory problems, mood irritability and mood disorder such as depression and anxiety, lack of energy, as well as recurrent headaches, especially morning headaches. We talked about trying to maintain a healthy lifestyle in general, as well as the importance of weight control. I encouraged the patient to eat healthy, exercise daily and keep well hydrated, to keep a scheduled bedtime and wake time routine, to not skip  any meals and eat healthy snacks in between meals. I advised the patient not to drive when feeling sleepy. I recommended the following at this time: sleep study with potential positive airway pressure titration. (We will score hypopneas at 4% and split the sleep study into diagnostic and treatment portion, if the estimated. 2 hour AHI is >20/h).   We also talked about RLS and PLMs.   I explained the sleep test procedure to the patient and also outlined possible surgical and non-surgical treatment options of OSA, including the use of a custom-made dental device (which would require a referral to a specialist dentist or oral surgeon), upper airway surgical options, such as pillar implants, radiofrequency surgery, tongue base surgery, and UPPP (which would involve a referral to an ENT surgeon). Rarely, jaw surgery such as mandibular advancement may be considered.  I also explained the CPAP  treatment option to the patient, who indicated that he would be willing to try CPAP if the need arises. I explained the importance of being compliant with PAP treatment, not only for insurance purposes but primarily to improve His symptoms, and for the patient's long term health benefit, including to reduce His cardiovascular risks. I answered all his questions today and the patient was in agreement. I would like to see him back after the sleep study is completed and encouraged him to call with any interim questions, concerns, problems or updates.   Thank you very much for allowing me to participate in the care of this nice patient. If I can be of any further assistance to you please do not hesitate to call me at [phone].  Sincerely,   Albert FoleySaima Roddrick Sharron, MD, PhD

## 2016-01-29 ENCOUNTER — Emergency Department (HOSPITAL_BASED_OUTPATIENT_CLINIC_OR_DEPARTMENT_OTHER)
Admission: EM | Admit: 2016-01-29 | Discharge: 2016-01-29 | Disposition: A | Discharge status: Home / Self Care | Payer: 59 | Attending: Emergency Medicine | Admitting: Emergency Medicine

## 2016-01-29 ENCOUNTER — Encounter (HOSPITAL_BASED_OUTPATIENT_CLINIC_OR_DEPARTMENT_OTHER): Payer: Self-pay | Admitting: *Deleted

## 2016-01-29 DIAGNOSIS — Z87891 Personal history of nicotine dependence: Secondary | ICD-10-CM | POA: Insufficient documentation

## 2016-01-29 DIAGNOSIS — Z6841 Body Mass Index (BMI) 40.0 and over, adult: Secondary | ICD-10-CM | POA: Diagnosis not present

## 2016-01-29 DIAGNOSIS — Z7984 Long term (current) use of oral hypoglycemic drugs: Secondary | ICD-10-CM | POA: Diagnosis not present

## 2016-01-29 DIAGNOSIS — Z79899 Other long term (current) drug therapy: Secondary | ICD-10-CM | POA: Insufficient documentation

## 2016-01-29 DIAGNOSIS — L732 Hidradenitis suppurativa: Secondary | ICD-10-CM | POA: Diagnosis not present

## 2016-01-29 DIAGNOSIS — L0211 Cutaneous abscess of neck: Secondary | ICD-10-CM | POA: Insufficient documentation

## 2016-01-29 DIAGNOSIS — E119 Type 2 diabetes mellitus without complications: Secondary | ICD-10-CM | POA: Diagnosis not present

## 2016-01-29 DIAGNOSIS — E669 Obesity, unspecified: Secondary | ICD-10-CM | POA: Diagnosis not present

## 2016-01-29 DIAGNOSIS — L0291 Cutaneous abscess, unspecified: Secondary | ICD-10-CM

## 2016-01-29 LAB — BASIC METABOLIC PANEL
ANION GAP: 6 (ref 5–15)
BUN: 12 mg/dL (ref 6–20)
CALCIUM: 8.7 mg/dL — AB (ref 8.9–10.3)
CO2: 27 mmol/L (ref 22–32)
CREATININE: 0.66 mg/dL (ref 0.61–1.24)
Chloride: 101 mmol/L (ref 101–111)
GFR calc Af Amer: >60 mL/min (ref >60)
GLUCOSE: 205 mg/dL — AB (ref 65–99)
Potassium: 4 mmol/L (ref 3.5–5.1)
Sodium: 134 mmol/L — ABNORMAL LOW (ref 135–145)

## 2016-01-29 LAB — CBC WITH DIFFERENTIAL/PLATELET
BASOS ABS: 0 10*3/uL (ref 0.0–0.1)
BASOS PCT: 0 %
EOS PCT: 4 %
Eosinophils Absolute: 0.3 10*3/uL (ref 0.0–0.7)
HCT: 42.2 % (ref 39.0–52.0)
Hemoglobin: 14.9 g/dL (ref 13.0–17.0)
Lymphocytes Relative: 30 %
Lymphs Abs: 2.1 10*3/uL (ref 0.7–4.0)
MCH: 30.5 pg (ref 26.0–34.0)
MCHC: 35.3 g/dL (ref 30.0–36.0)
MCV: 86.5 fL (ref 78.0–100.0)
MONO ABS: 0.5 10*3/uL (ref 0.1–1.0)
MONOS PCT: 7 %
Neutro Abs: 4 10*3/uL (ref 1.7–7.7)
Neutrophils Relative %: 59 %
PLATELETS: 197 10*3/uL (ref 150–400)
RBC: 4.88 MIL/uL (ref 4.22–5.81)
RDW: 12.8 % (ref 11.5–15.5)
WBC: 6.8 10*3/uL (ref 4.0–10.5)

## 2016-01-29 LAB — CBG MONITORING, ED: GLUCOSE-CAPILLARY: 207 mg/dL — AB (ref 65–99)

## 2016-01-29 MED ORDER — LIDOCAINE HCL (PF) 1 % IJ SOLN
5.0000 mL | Freq: Once | INTRAMUSCULAR | Status: AC
  Administered 2016-01-29: 5 mL
  Filled 2016-01-29: qty 5

## 2016-01-29 MED ORDER — HYDROMORPHONE HCL 1 MG/ML IJ SOLN
1.0000 mg | Freq: Once | INTRAMUSCULAR | Status: AC
  Administered 2016-01-29: 1 mg via INTRAVENOUS
  Filled 2016-01-29: qty 1

## 2016-01-29 MED ORDER — OXYCODONE-ACETAMINOPHEN 5-325 MG PO TABS: 1.0000 | ORAL_TABLET | Freq: Four times a day (QID) | ORAL | 0 refills | Status: DC | PRN

## 2016-01-29 MED ORDER — CEPHALEXIN 500 MG PO CAPS: 500.0000 mg | ORAL_CAPSULE | Freq: Four times a day (QID) | ORAL | 0 refills | Status: DC

## 2016-01-29 MED ORDER — SODIUM BICARBONATE 4 % IV SOLN
5.0000 mL | Freq: Once | INTRAVENOUS | Status: AC
  Administered 2016-01-29: 5 mL via SUBCUTANEOUS
  Filled 2016-01-29: qty 5

## 2016-01-29 MED ORDER — SULFAMETHOXAZOLE-TRIMETHOPRIM 800-160 MG PO TABS: 1.0000 | ORAL_TABLET | Freq: Two times a day (BID) | ORAL | 0 refills | Status: AC

## 2016-01-29 MED ORDER — LIDOCAINE HCL 2 % IJ SOLN
20.0000 mL | Freq: Once | INTRAMUSCULAR | Status: AC
  Administered 2016-01-29: 400 mg
  Filled 2016-01-29: qty 20

## 2016-01-29 MED FILL — OXYCODONE/APAP 5-325: 5-325 | 2 days supply | Qty: 10 | Fill #0

## 2016-01-29 MED FILL — CEPHALEXIN 500 MG CAPSULE: 500 | 7 days supply | Qty: 28 | Fill #0

## 2016-01-29 MED FILL — SULFAMETHOXAZOLE-TMP DS TAB: 800-160 | 7 days supply | Qty: 14 | Fill #0

## 2016-01-29 NOTE — ED Triage Notes (Signed)
C/o abscess to left back of neck since Friday.

## 2016-01-29 NOTE — ED Provider Notes (Addendum)
[facility] Note   CSN: [id number] Arrival date & time: 01/29/16  0748     History   Chief Complaint Chief Complaint  Patient presents with  . Abscess    HPI Albert White is a 40 y.o. male.  The history is provided by the patient.  Abscess  Associated symptoms: no fever   Patient presents with possible abscess in the back of his neck. History of hidradenitis at the site. He's had a previous surgical drainage. No fevers. Began a few days ago. Has had some drainage. Pain is moderate. States his sugars been good. Has not checked them last couple days. No fevers or chills. No localizing numbness or weakness.  Past Medical History:  Diagnosis Date  . Abscess   . Diabetes mellitus without complication (HCC)   . Hidradenitis suppurativa   . Hyperlipemia   . Morbid obesity (HCC)   . Vertigo     Patient Active Problem List   Diagnosis Date Noted  . Postop check 12/30/2013  . Cellulitis 12/14/2013  . Hypocalcemia 12/14/2013  . Morbid obesity (HCC) 12/14/2013  . Abscess 12/14/2013  . Hidradenitis suppurativa, neck and scalp 08/09/2012    Past Surgical History:  Procedure Laterality Date  . INCISION AND DRAINAGE ABSCESS Right 12/15/2013   Procedure: INCISION AND DRAINAGE Right Posterior neck ABSCESS;  Surgeon: Cherylynn Ridges, MD;  Location: [facility] OR;  Service: General;  Laterality: Right;  . INCISION AND DRAINAGE DEEP NECK ABSCESS     Hidradenitis - 3-4 times at [facility] and [place]       Home Medications    Prior to Admission medications   Medication Sig Start Date End Date Taking? Authorizing Provider  atorvastatin (LIPITOR) 10 MG tablet  09/18/15  Yes Historical Provider, MD  metFORMIN (GLUCOPHAGE) 500 MG tablet Take by mouth 2 (two) times daily with a meal.   Yes Historical Provider, MD  traZODone (DESYREL) 100 MG tablet Take 100 mg by mouth at bedtime.   Yes Historical Provider, MD  cephALEXin (KEFLEX) 500 MG capsule Take 1 capsule (500 mg total) by  mouth 4 (four) times daily. 01/29/16   Benjiman Core, MD  oxyCODONE-acetaminophen (PERCOCET/ROXICET) 5-325 MG tablet Take 1-2 tablets by mouth every 6 (six) hours as needed for severe pain. 01/29/16   Benjiman Core, MD  sulfamethoxazole-trimethoprim (BACTRIM DS,SEPTRA DS) 800-160 MG tablet Take 1 tablet by mouth 2 (two) times daily. 01/29/16 02/05/16  Benjiman Core, MD    Family History Family History  Problem Relation Age of Onset  . Arthritis Mother   . Hypertension Mother     Social History Social History  Substance Use Topics  . Smoking status: Former Smoker    Packs/day: 1.00    Quit date: 11/27/2013  . Smokeless tobacco: Never Used  . Alcohol use 0.6 oz/week    1 Cans of beer per week     Comment: social     Allergies   Review of patient's allergies indicates no known allergies.   Review of Systems Review of Systems  Constitutional: Negative for appetite change, chills and fever.  HENT: Negative for congestion.   Respiratory: Negative for shortness of breath.   Cardiovascular: Negative for chest pain.  Gastrointestinal: Negative for abdominal pain.  Genitourinary: Negative for difficulty urinating and discharge.  Musculoskeletal: Positive for neck pain.  Skin: Positive for wound.  Neurological: Negative for weakness.     Physical Exam Updated Vital Signs BP 123/74 (BP Location: Left Arm)   Pulse 76  Temp 97.7 F (36.5 C) (Oral)   Resp 18   Ht 5\' 10"  (1.778 m)   Wt (!) 385 lb (174.6 kg)   SpO2 96%   BMI 55.24 kg/m   Physical Exam  Constitutional: He appears well-developed.  Patient is obese  Eyes: EOM are normal.  Neck:  Tender indurated area with scarring on left posterior neck. One small fluctuant area without drainage. There is a firmness around the area. Approximately 4 cm x 6 cm.  Cardiovascular: Normal rate.   Pulmonary/Chest: Effort normal.  Abdominal: Soft.  Musculoskeletal: He exhibits no tenderness.  Neurological: He is alert.    Skin: Skin is warm.  Psychiatric: He has a normal mood and affect.     ED Treatments / Results  Labs (all labs ordered are listed, but only abnormal results are displayed) Labs Reviewed  BASIC METABOLIC PANEL - Abnormal; Notable for the following:       Result Value   Sodium 134 (*)    Glucose, Bld 205 (*)    Calcium 8.7 (*)    All other components within normal limits  CBG MONITORING, ED - Abnormal; Notable for the following:    Glucose-Capillary 207 (*)    All other components within normal limits  CBC WITH DIFFERENTIAL/PLATELET    EKG  EKG Interpretation None       Radiology No results found.  Procedures Procedures (including critical care time)  Medications Ordered in ED Medications  HYDROmorphone (DILAUDID) injection 1 mg (1 mg Intravenous Given 01/29/16 0901)  lidocaine (PF) (XYLOCAINE) 1 % injection 5 mL (5 mLs Infiltration Given 01/29/16 0849)  sodium bicarbonate (NEUT) 4 % injection 5 mL (5 mLs Subcutaneous Given 01/29/16 0849)  lidocaine (XYLOCAINE) 2 % (with pres) injection 400 mg (400 mg Infiltration Given 01/29/16 1043)  HYDROmorphone (DILAUDID) injection 1 mg (1 mg Intravenous Given 01/29/16 1044)     Initial Impression / Assessment and Plan / ED Course  I have reviewed the triage vital signs and the nursing notes.  Pertinent labs & imaging results that were available during my care of the patient were reviewed by me and considered in my medical decision making (see chart for details).  Clinical Course    Patient with hidradenitis and abscess. History of same. Drained with mild drainage. Will give antibiotics. Will have follow-up with PCP and could need surgery follow-up.  Final Clinical Impressions(s) / ED Diagnoses   Final diagnoses:  Hidradenitis suppurativa  Abscess    New Prescriptions New Prescriptions   CEPHALEXIN (KEFLEX) 500 MG CAPSULE    Take 1 capsule (500 mg total) by mouth 4 (four) times daily.   OXYCODONE-ACETAMINOPHEN  (PERCOCET/ROXICET) 5-325 MG TABLET    Take 1-2 tablets by mouth every 6 (six) hours as needed for severe pain.   SULFAMETHOXAZOLE-TRIMETHOPRIM (BACTRIM DS,SEPTRA DS) 800-160 MG TABLET    Take 1 tablet by mouth 2 (two) times daily.     Benjiman CoreNathan Genia Perin, MD 01/29/16 1059    Benjiman CoreNathan Dorrie Cocuzza, MD 02/06/16 1630

## 2016-02-27 ENCOUNTER — Encounter (HOSPITAL_BASED_OUTPATIENT_CLINIC_OR_DEPARTMENT_OTHER): Payer: Self-pay | Admitting: *Deleted

## 2016-02-27 ENCOUNTER — Emergency Department (HOSPITAL_BASED_OUTPATIENT_CLINIC_OR_DEPARTMENT_OTHER)
Admission: EM | Admit: 2016-02-27 | Discharge: 2016-02-27 | Disposition: A | Discharge status: Home / Self Care | Payer: 59 | Source: Home / Self Care | Attending: Emergency Medicine | Admitting: Emergency Medicine

## 2016-02-27 DIAGNOSIS — Z87891 Personal history of nicotine dependence: Secondary | ICD-10-CM

## 2016-02-27 DIAGNOSIS — L0211 Cutaneous abscess of neck: Secondary | ICD-10-CM

## 2016-02-27 DIAGNOSIS — L03221 Cellulitis of neck: Secondary | ICD-10-CM | POA: Insufficient documentation

## 2016-02-27 DIAGNOSIS — E119 Type 2 diabetes mellitus without complications: Secondary | ICD-10-CM

## 2016-02-27 DIAGNOSIS — L0291 Cutaneous abscess, unspecified: Secondary | ICD-10-CM

## 2016-02-27 DIAGNOSIS — Z7984 Long term (current) use of oral hypoglycemic drugs: Secondary | ICD-10-CM | POA: Insufficient documentation

## 2016-02-27 MED ORDER — CEPHALEXIN 500 MG PO CAPS: ORAL_CAPSULE | ORAL | 0 refills | Status: DC

## 2016-02-27 MED ORDER — LIDOCAINE-EPINEPHRINE (PF) 2 %-1:200000 IJ SOLN: 20.0000 mL | Freq: Once | INTRAMUSCULAR | Status: DC

## 2016-02-27 MED ORDER — CEPHALEXIN 250 MG PO CAPS
1000.0000 mg | ORAL_CAPSULE | Freq: Once | ORAL | Status: AC
  Administered 2016-02-27: 1000 mg via ORAL
  Filled 2016-02-27: qty 4

## 2016-02-27 MED ORDER — SULFAMETHOXAZOLE-TRIMETHOPRIM 800-160 MG PO TABS: 1.0000 | ORAL_TABLET | Freq: Two times a day (BID) | ORAL | 0 refills | Status: DC

## 2016-02-27 MED ORDER — LIDOCAINE-EPINEPHRINE 2 %-1:100000 IJ SOLN
INTRAMUSCULAR | Status: AC
  Administered 2016-02-27: 20 mL
  Filled 2016-02-27: qty 1

## 2016-02-27 MED ORDER — LIDOCAINE-EPINEPHRINE 2 %-1:100000 IJ SOLN
20.0000 mL | Freq: Once | INTRAMUSCULAR | Status: AC
Start: 2016-02-27 — End: 2016-02-27
  Administered 2016-02-27: 20 mL

## 2016-02-27 MED ORDER — SULFAMETHOXAZOLE-TRIMETHOPRIM 800-160 MG PO TABS
1.0000 | ORAL_TABLET | Freq: Once | ORAL | Status: AC
  Administered 2016-02-27: 1 via ORAL
  Filled 2016-02-27: qty 1

## 2016-02-27 MED ORDER — ACETAMINOPHEN 325 MG PO TABS: 650.0000 mg | ORAL_TABLET | Freq: Three times a day (TID) | ORAL | 0 refills | Status: DC

## 2016-02-27 MED ORDER — LIDOCAINE-EPINEPHRINE-TETRACAINE (LET) SOLUTION
3.0000 mL | Freq: Once | NASAL | Status: AC
  Administered 2016-02-27: 3 mL via TOPICAL
  Filled 2016-02-27: qty 3

## 2016-02-27 MED ORDER — OXYCODONE HCL 5 MG PO TABS: 10.0000 mg | ORAL_TABLET | Freq: Four times a day (QID) | ORAL | 0 refills | Status: DC | PRN

## 2016-02-27 MED ORDER — HYDROMORPHONE HCL 1 MG/ML IJ SOLN
2.0000 mg | Freq: Once | INTRAMUSCULAR | Status: AC
  Administered 2016-02-27: 2 mg via INTRAMUSCULAR
  Filled 2016-02-27: qty 2

## 2016-02-27 MED FILL — oxyCODONE HCL 5 MG TABS: 5 | 3 days supply | Qty: 20 | Fill #0

## 2016-02-27 MED FILL — SULFAMETHOXAZOLE/TMP DS TAB: 800-160 | 10 days supply | Qty: 20 | Fill #0

## 2016-02-27 MED FILL — ACETAMINOPHEN 325 MG TABLET: 325 | 16 days supply | Qty: 100 | Fill #0

## 2016-02-27 MED FILL — CEPHALEXIN 500 MG CAPSULE: 500 | 10 days supply | Qty: 40 | Fill #0

## 2016-02-27 NOTE — ED Triage Notes (Signed)
Pt reports abcess to neck x 2 days, with hx of same. Requests "IV and iv pain medicine, pills don't work for me!"

## 2016-02-27 NOTE — ED Provider Notes (Signed)
[facility]-EMERGENCY DEPT Provider Note   CSN: [id number] Arrival date & time: 02/27/16  0753     History   Chief Complaint Chief Complaint  Patient presents with  . Skin Problem    HPI Albert White is a 40 y.o. male.   Abscess  Location:  Head/neck Head/neck abscess location:  L neck Size:  1.5 Abscess quality: induration, painful, redness and warmth   Red streaking: no   Progression:  Worsening Pain details:    Quality:  Sharp   Severity:  Mild   Timing:  Constant   Progression:  Worsening Chronicity:  Recurrent Context: diabetes   Context: not injected drug use and not insect bite/sting   Relieved by:  None tried Worsened by:  Nothing Ineffective treatments:  None tried Associated symptoms: no anorexia, no fever, no nausea and no vomiting     Past Medical History:  Diagnosis Date  . Abscess   . Diabetes mellitus without complication (HCC)   . Hidradenitis suppurativa   . Hyperlipemia   . Morbid obesity (HCC)   . Vertigo     Patient Active Problem List   Diagnosis Date Noted  . Postop check 12/30/2013  . Cellulitis 12/14/2013  . Hypocalcemia 12/14/2013  . Morbid obesity (HCC) 12/14/2013  . Abscess 12/14/2013  . Hidradenitis suppurativa, neck and scalp 08/09/2012    Past Surgical History:  Procedure Laterality Date  . INCISION AND DRAINAGE ABSCESS Right 12/15/2013   Procedure: INCISION AND DRAINAGE Right Posterior neck ABSCESS;  Surgeon: Cherylynn RidgesJames O [name], MD;  [facility];  Service: General;  Laterality: Right;  . INCISION AND DRAINAGE DEEP NECK ABSCESS     [facility] [place]       Home Medications    Prior to Admission medications   Medication Sig Start Date End Date Taking? Authorizing Provider  acetaminophen (TYLENOL) 325 MG tablet Take 2 tablets (650 mg total) by mouth every 8 (eight) hours. 02/27/16   Marily MemosJason Chrishon Martino, MD  atorvastatin (LIPITOR) 10 MG tablet  09/18/15   Historical Provider, MD  cephALEXin (KEFLEX)  500 MG capsule 2 caps po bid x 10 days 02/27/16   Marily MemosJason Dallas Torok, MD  metFORMIN (GLUCOPHAGE) 500 MG tablet Take by mouth 2 (two) times daily with a meal.    Historical Provider, MD  oxyCODONE (ROXICODONE) 5 MG immediate release tablet Take 2-3 tablets (10-15 mg total) by mouth every 6 (six) hours as needed for severe pain. 02/27/16   Marily MemosJason Jeanann Balinski, MD  oxyCODONE-acetaminophen (PERCOCET/ROXICET) 5-325 MG tablet Take 1-2 tablets by mouth every 6 (six) hours as needed for severe pain. 01/29/16   Benjiman CoreNathan Pickering, MD  sulfamethoxazole-trimethoprim (BACTRIM DS,SEPTRA DS) 800-160 MG tablet Take 1 tablet by mouth 2 (two) times daily. 02/27/16 03/08/16  Marily MemosJason Marzetta Lanza, MD  traZODone (DESYREL) 100 MG tablet Take 100 mg by mouth at bedtime.    Historical Provider, MD    Family History Family History  Problem Relation Age of Onset  . Arthritis Mother   . Hypertension Mother     Social History Social History  Substance Use Topics  . Smoking status: Former Smoker    Packs/day: 1.00    Quit date: 11/27/2013  . Smokeless tobacco: Never Used  . Alcohol use 0.6 oz/week    1 Cans of beer per week     Comment: social     Allergies   Review of patient's allergies indicates no known allergies.   Review of Systems Review of Systems  Constitutional:  Negative for fever.  Gastrointestinal: Negative for anorexia, nausea and vomiting.  Musculoskeletal: Positive for neck pain.  Skin: Positive for rash.  All other systems reviewed and are negative.    Physical Exam Updated Vital Signs BP 126/72 (BP Location: Right Arm)   Pulse 69   Temp 98.5 F (36.9 C) (Oral)   Resp 18   Ht 5\' 10"  (1.778 m)   Wt (!) 390 lb (176.9 kg)   SpO2 98%   BMI 55.96 kg/m   Physical Exam  Constitutional: He is oriented to person, place, and time. He appears well-developed and well-nourished.  HENT:  Head: Normocephalic and atraumatic.  Neck: Normal range of motion.  Cardiovascular: Normal rate.   Pulmonary/Chest:  Effort normal. No respiratory distress.  Abdominal: He exhibits no distension.  Musculoskeletal: Normal range of motion.  Neurological: He is alert and oriented to person, place, and time. No cranial nerve deficit.  Skin: Skin is warm and dry.  Indurated, warm, tender area to left posterior neck.   Nursing note and vitals reviewed.    ED Treatments / Results  Labs (all labs ordered are listed, but only abnormal results are displayed) Labs Reviewed - No data to display  EKG  EKG Interpretation None       Radiology No results found.  Procedures .Marland KitchenIncision and Drainage Date/Time: 02/28/2016 2:54 PM Performed by: Marily Memos Authorized by: Marily Memos   Consent:    Consent obtained:  Verbal   Consent given by:  Patient   Risks discussed:  Bleeding and incomplete drainage   Alternatives discussed:  No treatment Location:    Type:  Abscess   Size:  2x2   Location:  Neck   Neck location:  L posterior Pre-procedure details:    Skin preparation:  Antiseptic wash and Betadine Anesthesia (see MAR for exact dosages):    Anesthesia method:  Local infiltration and topical application   Topical anesthetic:  LET   Local anesthetic:  Lidocaine 1% WITH epi Procedure type:    Complexity:  Simple Procedure details:    Needle aspiration: yes     Needle size:  18 G   Incision types:  Stab incision   Incision depth:  Submucosal   Scalpel blade:  11   Wound management:  Irrigated with saline   Drainage:  Bloody and purulent   Drainage amount:  Scant   Wound treatment:  Wound left open   Packing materials:  None Post-procedure details:    Patient tolerance of procedure:  Tolerated with difficulty Comments:     Patient with a lot of pain. Apparently topical anestehtics and lidocaine don't actually numb him up.    (including critical care time)  EMERGENCY DEPARTMENT US SOFT TISSUE INTERPRETATION "Study: Limited Soft Tissue Ultrasound"  INDICATIONS: Soft tissue  infection Multiple views of the body part were obtained in real-time with a multi-frequency linear probe PERFORMED BY:  Myself IMAGES ARCHIVED?: Yes SIDE:Left BODY PART:Neck FINDINGS: Abcess present and Cellulitis present INTERPRETATION:  Abcess present and Cellulitis present   CPT: Neck 13086-57  Medications Ordered in ED Medications  HYDROmorphone (DILAUDID) injection 2 mg (2 mg Intramuscular Given 02/27/16 0842)  lidocaine-EPINEPHrine-tetracaine (LET) solution (3 mLs Topical Given 02/27/16 0841)  cephALEXin (KEFLEX) capsule 1,000 mg (1,000 mg Oral Given 02/27/16 0911)  sulfamethoxazole-trimethoprim (BACTRIM DS,SEPTRA DS) 800-160 MG per tablet 1 tablet (1 tablet Oral Given 02/27/16 0911)  lidocaine-EPINEPHrine (XYLOCAINE W/EPI) 2 %-1:100000 (with pres) injection 20 mL (20 mLs Other Given 02/27/16 1000)  HYDROmorphone (DILAUDID) injection  2 mg (2 mg Intramuscular Given 02/27/16 1015)     Initial Impression / Assessment and Plan / ED Course  I have reviewed the triage vital signs and the nursing notes.  Pertinent labs & imaging results that were available during my care of the patient were reviewed by me and considered in my medical decision making (see chart for details).  Clinical Course   Abscess and cellulitis in setting of hidradenitis. Will I/D and start on abx.  I/D as per note above. Started on abx. D/w surgery who stated patient should call office to be seen in follow up for consideration of further treatment of hidradenitis.   Final Clinical Impressions(s) / ED Diagnoses   Final diagnoses:  Abscess  Cellulitis of neck    New Prescriptions Discharge Medication List as of 02/27/2016 10:27 AM    START taking these medications   Details  acetaminophen (TYLENOL) 325 MG tablet Take 2 tablets (650 mg total) by mouth every 8 (eight) hours., Starting Wed 02/27/2016, Print    cephALEXin (KEFLEX) 500 MG capsule 2 caps po bid x 10 days, Print    oxyCODONE (ROXICODONE)  5 MG immediate release tablet Take 2-3 tablets (10-15 mg total) by mouth every 6 (six) hours as needed for severe pain., Starting Wed 02/27/2016, Print    sulfamethoxazole-trimethoprim (BACTRIM DS,SEPTRA DS) 800-160 MG tablet Take 1 tablet by mouth 2 (two) times daily., Starting Wed 02/27/2016, Until Sat 03/08/2016, Print         Marily Memos, MD 02/28/16 [phone]

## 2016-02-29 ENCOUNTER — Inpatient Hospital Stay (HOSPITAL_BASED_OUTPATIENT_CLINIC_OR_DEPARTMENT_OTHER)
Admission: EM | Admit: 2016-02-29 | Discharge: 2016-03-05 | DRG: 603 | Disposition: A | Discharge status: Home / Self Care | Payer: 59 | Attending: Internal Medicine | Admitting: Internal Medicine

## 2016-02-29 ENCOUNTER — Emergency Department (HOSPITAL_BASED_OUTPATIENT_CLINIC_OR_DEPARTMENT_OTHER): Payer: 59

## 2016-02-29 ENCOUNTER — Encounter (HOSPITAL_BASED_OUTPATIENT_CLINIC_OR_DEPARTMENT_OTHER): Payer: Self-pay

## 2016-02-29 DIAGNOSIS — E119 Type 2 diabetes mellitus without complications: Secondary | ICD-10-CM | POA: Diagnosis not present

## 2016-02-29 DIAGNOSIS — L03221 Cellulitis of neck: Secondary | ICD-10-CM | POA: Diagnosis present

## 2016-02-29 DIAGNOSIS — L732 Hidradenitis suppurativa: Secondary | ICD-10-CM | POA: Diagnosis present

## 2016-02-29 DIAGNOSIS — H8110 Benign paroxysmal vertigo, unspecified ear: Secondary | ICD-10-CM | POA: Diagnosis present

## 2016-02-29 DIAGNOSIS — L0390 Cellulitis, unspecified: Secondary | ICD-10-CM | POA: Diagnosis present

## 2016-02-29 DIAGNOSIS — L0291 Cutaneous abscess, unspecified: Secondary | ICD-10-CM

## 2016-02-29 DIAGNOSIS — E785 Hyperlipidemia, unspecified: Secondary | ICD-10-CM | POA: Diagnosis present

## 2016-02-29 DIAGNOSIS — E782 Mixed hyperlipidemia: Secondary | ICD-10-CM | POA: Diagnosis present

## 2016-02-29 DIAGNOSIS — Z8249 Family history of ischemic heart disease and other diseases of the circulatory system: Secondary | ICD-10-CM

## 2016-02-29 DIAGNOSIS — Z7984 Long term (current) use of oral hypoglycemic drugs: Secondary | ICD-10-CM

## 2016-02-29 DIAGNOSIS — E1165 Type 2 diabetes mellitus with hyperglycemia: Secondary | ICD-10-CM

## 2016-02-29 DIAGNOSIS — L0211 Cutaneous abscess of neck: Principal | ICD-10-CM | POA: Diagnosis present

## 2016-02-29 DIAGNOSIS — L03811 Cellulitis of head [any part, except face]: Secondary | ICD-10-CM

## 2016-02-29 DIAGNOSIS — Z23 Encounter for immunization: Secondary | ICD-10-CM

## 2016-02-29 DIAGNOSIS — Z87891 Personal history of nicotine dependence: Secondary | ICD-10-CM

## 2016-02-29 DIAGNOSIS — Z6841 Body Mass Index (BMI) 40.0 and over, adult: Secondary | ICD-10-CM

## 2016-02-29 DIAGNOSIS — G4700 Insomnia, unspecified: Secondary | ICD-10-CM | POA: Diagnosis present

## 2016-02-29 DIAGNOSIS — Z8261 Family history of arthritis: Secondary | ICD-10-CM

## 2016-02-29 LAB — BASIC METABOLIC PANEL
Anion gap: 8 (ref 5–15)
BUN: 11 mg/dL (ref 6–20)
CALCIUM: 8.6 mg/dL — AB (ref 8.9–10.3)
CO2: 24 mmol/L (ref 22–32)
CREATININE: 0.77 mg/dL (ref 0.61–1.24)
Chloride: 101 mmol/L (ref 101–111)
GFR calc non Af Amer: >60 mL/min (ref >60)
Glucose, Bld: 259 mg/dL — ABNORMAL HIGH (ref 65–99)
Potassium: 4 mmol/L (ref 3.5–5.1)
SODIUM: 133 mmol/L — AB (ref 135–145)

## 2016-02-29 LAB — CBC
HEMATOCRIT: 41.1 % (ref 39.0–52.0)
HEMOGLOBIN: 14.1 g/dL (ref 13.0–17.0)
MCH: 29.8 pg (ref 26.0–34.0)
MCHC: 34.3 g/dL (ref 30.0–36.0)
MCV: 86.9 fL (ref 78.0–100.0)
Platelets: 166 10*3/uL (ref 150–400)
RBC: 4.73 MIL/uL (ref 4.22–5.81)
RDW: 12.5 % (ref 11.5–15.5)
WBC: 6 10*3/uL (ref 4.0–10.5)

## 2016-02-29 LAB — CBC WITH DIFFERENTIAL/PLATELET
BASOS PCT: 0 %
Basophils Absolute: 0 10*3/uL (ref 0.0–0.1)
EOS ABS: 0.3 10*3/uL (ref 0.0–0.7)
EOS PCT: 3 %
HCT: 42.3 % (ref 39.0–52.0)
Hemoglobin: 14.8 g/dL (ref 13.0–17.0)
Lymphocytes Relative: 26 %
Lymphs Abs: 2.2 10*3/uL (ref 0.7–4.0)
MCH: 30.4 pg (ref 26.0–34.0)
MCHC: 35 g/dL (ref 30.0–36.0)
MCV: 86.9 fL (ref 78.0–100.0)
MONO ABS: 0.7 10*3/uL (ref 0.1–1.0)
MONOS PCT: 8 %
Neutro Abs: 5.4 10*3/uL (ref 1.7–7.7)
Neutrophils Relative %: 63 %
Platelets: 181 10*3/uL (ref 150–400)
RBC: 4.87 MIL/uL (ref 4.22–5.81)
RDW: 12.3 % (ref 11.5–15.5)
WBC: 8.6 10*3/uL (ref 4.0–10.5)

## 2016-02-29 LAB — GLUCOSE, CAPILLARY
GLUCOSE-CAPILLARY: 245 mg/dL — AB (ref 65–99)
GLUCOSE-CAPILLARY: 312 mg/dL — AB (ref 65–99)
Glucose-Capillary: 181 mg/dL — ABNORMAL HIGH (ref 65–99)
Glucose-Capillary: 299 mg/dL — ABNORMAL HIGH (ref 65–99)

## 2016-02-29 LAB — CBG MONITORING, ED: Glucose-Capillary: 282 mg/dL — ABNORMAL HIGH (ref 65–99)

## 2016-02-29 LAB — CREATININE, SERUM: Creatinine, Ser: 0.93 mg/dL (ref 0.61–1.24)

## 2016-02-29 MED ORDER — VANCOMYCIN HCL 10 G IV SOLR
1500.0000 mg | Freq: Three times a day (TID) | INTRAVENOUS | Status: DC
  Administered 2016-02-29 – 2016-03-05 (×14): 1500 mg via INTRAVENOUS
  Filled 2016-02-29 (×16): qty 1500

## 2016-02-29 MED ORDER — SODIUM CHLORIDE 0.9 % IV SOLN
INTRAVENOUS | Status: AC
  Administered 2016-02-29: 225 mL via INTRAVENOUS
  Administered 2016-02-29: 1000 mL via INTRAVENOUS

## 2016-02-29 MED ORDER — TRAZODONE HCL 50 MG PO TABS: 25.0000 mg | ORAL_TABLET | Freq: Every evening | ORAL | Status: DC | PRN

## 2016-02-29 MED ORDER — ONDANSETRON HCL 4 MG/2ML IJ SOLN
4.0000 mg | Freq: Once | INTRAMUSCULAR | Status: AC
  Administered 2016-02-29: 4 mg via INTRAVENOUS
  Filled 2016-02-29: qty 2

## 2016-02-29 MED ORDER — ATORVASTATIN CALCIUM 10 MG PO TABS
10.0000 mg | ORAL_TABLET | Freq: Every day | ORAL | Status: DC
  Administered 2016-02-29 – 2016-03-04 (×5): 10 mg via ORAL
  Filled 2016-02-29 (×5): qty 1

## 2016-02-29 MED ORDER — TRAZODONE HCL 100 MG PO TABS
100.0000 mg | ORAL_TABLET | Freq: Every day | ORAL | Status: DC
  Administered 2016-02-29 – 2016-03-04 (×5): 100 mg via ORAL
  Filled 2016-02-29 (×5): qty 1

## 2016-02-29 MED ORDER — HYDROMORPHONE HCL 1 MG/ML IJ SOLN
1.0000 mg | Freq: Once | INTRAMUSCULAR | Status: AC
  Administered 2016-02-29: 1 mg via INTRAVENOUS
  Filled 2016-02-29: qty 1

## 2016-02-29 MED ORDER — VANCOMYCIN HCL IN DEXTROSE 1-5 GM/200ML-% IV SOLN
1000.0000 mg | Freq: Once | INTRAVENOUS | Status: AC
  Administered 2016-02-29: 1000 mg via INTRAVENOUS
  Filled 2016-02-29: qty 200

## 2016-02-29 MED ORDER — VANCOMYCIN HCL 10 G IV SOLR
1500.0000 mg | Freq: Once | INTRAVENOUS | Status: DC
  Filled 2016-02-29: qty 1500

## 2016-02-29 MED ORDER — ENOXAPARIN SODIUM 40 MG/0.4ML ~~LOC~~ SOLN
40.0000 mg | SUBCUTANEOUS | Status: DC
  Administered 2016-02-29: 40 mg via SUBCUTANEOUS
  Filled 2016-02-29: qty 0.4

## 2016-02-29 MED ORDER — IOPAMIDOL (ISOVUE-300) INJECTION 61%: 100.0000 mL | Freq: Once | INTRAVENOUS | Status: DC | PRN

## 2016-02-29 MED ORDER — HYDROCODONE-ACETAMINOPHEN 5-325 MG PO TABS
1.0000 | ORAL_TABLET | ORAL | Status: DC | PRN
  Administered 2016-02-29 – 2016-03-02 (×12): 2 via ORAL
  Administered 2016-03-03: 1 via ORAL
  Administered 2016-03-03 – 2016-03-05 (×9): 2 via ORAL
  Filled 2016-02-29 (×22): qty 2

## 2016-02-29 MED ORDER — ACETAMINOPHEN 650 MG RE SUPP: 650.0000 mg | Freq: Four times a day (QID) | RECTAL | Status: DC | PRN

## 2016-02-29 MED ORDER — IOPAMIDOL (ISOVUE-300) INJECTION 61%
100.0000 mL | Freq: Once | INTRAVENOUS | Status: AC | PRN
  Administered 2016-02-29: 100 mL via INTRAVENOUS

## 2016-02-29 MED ORDER — LEVOFLOXACIN IN D5W 750 MG/150ML IV SOLN
750.0000 mg | Freq: Once | INTRAVENOUS | Status: AC
  Administered 2016-02-29: 750 mg via INTRAVENOUS
  Filled 2016-02-29: qty 150

## 2016-02-29 MED ORDER — ENOXAPARIN SODIUM 100 MG/ML ~~LOC~~ SOLN
0.5000 mg/kg | SUBCUTANEOUS | Status: DC
  Administered 2016-03-01 – 2016-03-05 (×5): 90 mg via SUBCUTANEOUS
  Filled 2016-02-29 (×5): qty 1

## 2016-02-29 MED ORDER — ACETAMINOPHEN 325 MG PO TABS: 650.0000 mg | ORAL_TABLET | Freq: Four times a day (QID) | ORAL | Status: DC | PRN

## 2016-02-29 MED ORDER — INSULIN ASPART 100 UNIT/ML ~~LOC~~ SOLN
0.0000 [IU] | Freq: Three times a day (TID) | SUBCUTANEOUS | Status: DC
  Administered 2016-02-29: 15 [IU] via SUBCUTANEOUS
  Administered 2016-02-29 – 2016-03-01 (×3): 4 [IU] via SUBCUTANEOUS
  Administered 2016-03-01: 7 [IU] via SUBCUTANEOUS
  Administered 2016-03-02: 4 [IU] via SUBCUTANEOUS
  Administered 2016-03-02 (×2): 3 [IU] via SUBCUTANEOUS
  Administered 2016-03-03: 4 [IU] via SUBCUTANEOUS
  Administered 2016-03-03 (×2): 3 [IU] via SUBCUTANEOUS

## 2016-02-29 MED ORDER — VANCOMYCIN HCL 10 G IV SOLR
1500.0000 mg | Freq: Once | INTRAVENOUS | Status: AC
  Administered 2016-02-29: 1500 mg via INTRAVENOUS
  Filled 2016-02-29: qty 1500

## 2016-02-29 MED ORDER — ENOXAPARIN SODIUM 60 MG/0.6ML ~~LOC~~ SOLN
50.0000 mg | SUBCUTANEOUS | Status: AC
  Administered 2016-02-29: 50 mg via SUBCUTANEOUS
  Filled 2016-02-29: qty 0.6

## 2016-02-29 MED ORDER — HYDROMORPHONE HCL 1 MG/ML IJ SOLN
1.0000 mg | INTRAMUSCULAR | Status: DC | PRN
  Administered 2016-02-29 – 2016-03-05 (×23): 1 mg via INTRAVENOUS
  Filled 2016-02-29 (×23): qty 1

## 2016-02-29 MED ORDER — LIDOCAINE-EPINEPHRINE 2 %-1:100000 IJ SOLN
20.0000 mL | Freq: Once | INTRAMUSCULAR | Status: AC
  Administered 2016-02-29: 20 mL
  Filled 2016-02-29: qty 1

## 2016-02-29 MED ORDER — POLYETHYLENE GLYCOL 3350 17 G PO PACK
17.0000 g | PACK | Freq: Every day | ORAL | Status: DC | PRN
  Filled 2016-02-29: qty 1

## 2016-02-29 NOTE — H&P (Signed)
[facility] History and Physical  Albert PetrinSteven T Dase UJW:[id number] DOB: [birth year] DOA: 02/29/2016  Referring physician:  PCP: Alysia PennaHOLWERDA, SCOTT, MD   Chief Complaint:   HPI: Albert PetrinSteven T White is a [age] y.o. male past mental history significant for diabetes and neck abscess in the past on left. Patient states the symptoms he has of neck pain swelling have been going on for about a month. Symptoms can acutely worse on Wednesday. Patient went to the emergency room for evaluation where the abscess was I&D. Patient was sent home on Keflex and Bactrim which he states he took as directed. Patient did not have much improvement and actually felt that is getting worse over return to the emergency room.   ED Course: There CT scan showed deep neck abscess and they did a repeat incision and drainage. Patient was given a dose of Levaquin and [facility].  Denies fevers, chills, nausea, vomiting, rash, headache.  Review of Systems:  As per HPI otherwise 10 point review of systems negative.    Past Medical History:  Diagnosis Date  . Abscess   . Diabetes mellitus without complication (HCC)   . Hidradenitis suppurativa   . Hyperlipemia   . Morbid obesity (HCC)   . Vertigo    Past Surgical History:  Procedure Laterality Date  . INCISION AND DRAINAGE ABSCESS Right 12/15/2013   Procedure: INCISION AND DRAINAGE Right Posterior neck ABSCESS;  Surgeon: Cherylynn RidgesJames O [name], MD;  [facility];  Service: General;  Laterality: Right;  . INCISION AND DRAINAGE DEEP NECK ABSCESS     [facility] [place]   Social History:  reports that he quit smoking about 2 years ago. He smoked 1.00 pack per day. He has never used smokeless tobacco. He reports that he drinks about 0.6 oz of alcohol per week . He reports that he does not use drugs.  No Known Allergies  Family History  Problem Relation Age of Onset  . Arthritis Mother   . Hypertension  Mother      Prior to Admission medications   Medication Sig Start Date End Date Taking? Authorizing Provider  acetaminophen (TYLENOL) 325 MG tablet Take 2 tablets (650 mg total) by mouth every 8 (eight) hours. 02/27/16   Marily MemosJason Mesner, MD  atorvastatin (LIPITOR) 10 MG tablet  09/18/15   Historical Provider, MD  cephALEXin (KEFLEX) 500 MG capsule 2 caps po bid x 10 days 02/27/16   Marily MemosJason Mesner, MD  metFORMIN (GLUCOPHAGE) 500 MG tablet Take by mouth 2 (two) times daily with a meal.    Historical Provider, MD  oxyCODONE (ROXICODONE) 5 MG immediate release tablet Take 2-3 tablets (10-15 mg total) by mouth every 6 (six) hours as needed for severe pain. 02/27/16   Marily MemosJason Mesner, MD  oxyCODONE-acetaminophen (PERCOCET/ROXICET) 5-325 MG tablet Take 1-2 tablets by mouth every 6 (six) hours as needed for severe pain. 01/29/16   Benjiman CoreNathan Pickering, MD  sulfamethoxazole-trimethoprim (BACTRIM DS,SEPTRA DS) 800-160 MG tablet Take 1 tablet by mouth 2 (two) times daily. 02/27/16 03/08/16  Marily MemosJason Mesner, MD  traZODone (DESYREL) 100 MG tablet Take 100 mg by mouth at bedtime.    Historical Provider, MD   Physical Exam: Vitals:   02/29/16 0240 02/29/16 0459 02/29/16 0752 02/29/16 0900  BP: 127/87 129/78 125/87 139/82  Pulse: 98 88 90 90  Resp: 20 18 18 18   Temp: 98.3 F (36.8 C)   98.7 F (37.1 C)  TempSrc: Oral  Oral  SpO2: 95% 96% 98% 95%  Weight:    (!) 175 kg (385 lb 11.2 oz)  Height:    5' 10.5" (1.791 m)    Wt Readings from Last 3 Encounters:  02/29/16 (!) 175 kg (385 lb 11.2 oz)  02/27/16 (!) 176.9 kg (390 lb)  01/29/16 (!) 174.6 kg (385 lb)    General:  Appears calm and comfortable Eyes:  PERRL, EOMI, normal lids, iris ENT:  grossly normal hearing, lips & tongue Neck:  no LAD, masses or thyromegaly Cardiovascular:  RRR, no m/r/g. No LE edema.  Respiratory:  CTA bilaterally, no w/r/r. Normal respiratory effort. Abdomen:  soft, ntnd Skin:  no rash But abscess with packing in place not actively  draining on left posterior neck. Erythema and induration spreading about 8-9 inches encompassing left neck part of right neck and spreading towards the top of the skull. Musculoskeletal:  grossly normal tone BUE/BLE Psychiatric:  grossly normal mood and affect, speech fluent and appropriate Neurologic:  CN 2-12 grossly intact, moves all extremities in coordinated fashion. alert and oriented 3           Labs on Admission:  Basic Metabolic Panel:  Recent Labs Lab 02/29/16 0400  NA 133*  K 4.0  CL 101  CO2 24  GLUCOSE 259*  BUN 11  CREATININE 0.77  CALCIUM 8.6*   Liver Function Tests: No results for input(s): AST, ALT, ALKPHOS, BILITOT, PROT, ALBUMIN in the last 168 hours. No results for input(s): LIPASE, AMYLASE in the last 168 hours. No results for input(s): AMMONIA in the last 168 hours. CBC:  Recent Labs Lab 02/29/16 0400  WBC 8.6  NEUTROABS 5.4  HGB 14.8  HCT 42.3  MCV 86.9  PLT 181   Cardiac Enzymes: No results for input(s): CKTOTAL, CKMB, CKMBINDEX, TROPONINI in the last 168 hours.  BNP (last 3 results) No results for input(s): BNP in the last 8760 hours.  ProBNP (last 3 results) No results for input(s): PROBNP in the last 8760 hours.   Serum creatinine: 0.77 mg/dL 16/10/96 0454 Estimated creatinine clearance: 200.8 mL/min  CBG:  Recent Labs Lab 02/29/16 0748 02/29/16 0908  GLUCAP 282* 245*    Radiological Exams on Admission: Ct Soft Tissue Neck W Contrast  Result Date: 02/29/2016 CLINICAL DATA:  40 y/o M; 16 years with chronic neck abscess post incision and drainage 2 days ago. EXAM: CT NECK WITH CONTRAST TECHNIQUE: Multidetector CT imaging of the neck was performed using the standard protocol following the bolus administration of intravenous contrast. CONTRAST:  ISOVUE-300 IOPAMIDOL (ISOVUE-300) INJECTION 61% COMPARISON:  12/15/2013 CT of the neck FINDINGS: Pharynx and larynx: Normal. No mass or swelling. Salivary glands: No inflammation,  mass, or stone. Thyroid: Normal. Lymph nodes: Occipital and Left posterior cervical lymphadenopathy is likely reactive. Vascular: Negative. Limited intracranial: Negative. Visualized orbits: Negative. Mastoids and visualized paranasal sinuses: Clear. Skeleton: No acute or aggressive process. Upper chest: Negative. Other: There is a fluid collection in the left posterior lower neck subcutaneous fat measuring 13 x 38 x 11 mm (AP x ML x CC), series 4, image 56 and series 10, image 91. There is extensive surrounding stranding of subcutaneous fat and dermal thickening extending to the left suprascapular region inferiorly, the left posterior cervical triangle anteriorly, and across the midline to soft tissues of the right posterior neck. Additionally, there is minimal stranding within the left-sided paraspinal muscles posterior deep IMPRESSION: 1. Draining fluid collection at the left posterior lower neck subcutaneous fat measuring up  to 38 mm is consistent with abscess. 2. Surrounding inflammatory changes throughout the dermis and subcutaneous fat of the posterior neck and left suprascapular region, also extending into deep cervical spaces of the left posterior triangle and left paraspinal muscles may be reactive or represent associated cellulitis. Electronically Signed   By: Mitzi Hansen M.D.   On: 02/29/2016 05:26     EKG: pending  Assessment/Plan Principal Problem:   Cellulitis Active Problems:   Abscess   DM (diabetes mellitus) (HCC)   HLD (hyperlipidemia)   Benign paroxysmal positional vertigo   Neck abscess   Neck abscess with cellulitis Patient given Levaquin and its lesser [place] after outpatient treatment failure on Bactrim and Keflex Patient currently with packing in the wound. Note site was I&D twice Start patient on vancomycin [place] surgery has been paged for consult per patient request as he was previously seen by Dr. Lindie Spruce  Diabetes SSI Hold  metformin  Hyperlipidemia Continue statin  Sleep issues Trazodone qhs  Vertigo No symptoms, will monitor  Obs Med-Surg Code Status: full  DVT Prophylaxis: lovenox Family Communication: Called wife [phone] Disposition Plan: Pending Improvement    Haydee Salter, MD Family Medicine [facility] [email or web address] Password [email or web address]

## 2016-02-29 NOTE — ED Provider Notes (Signed)
[facility] Provider Note: Albert Dell, MD, FACEP  CSN: [id number] MRN: [id number] ARRIVAL: 02/29/16 at 0224   CHIEF COMPLAINT  Abscess   HISTORY OF PRESENT ILLNESS  Albert White is a 40 y.o. male with a history of morbid obesity and a long-standing history of hidradenitis suppurativa of the neck. He underwent surgical intervention for this about 2 years ago and had been doing well until about a month ago. He has had a recurrence of pain and swelling on the left posterior neck and has been seen as recently as 2 days ago. At that time an I&D was performed and he was discharged on Bactrim and Keflex. He returns with pain that has acutely worsened overnight. The pain is moderate to severe and worse with palpation or movement. There has been increased drainage and swelling.   Past Medical History:  Diagnosis Date  . Abscess   . Diabetes mellitus without complication (HCC)   . Hidradenitis suppurativa   . Hyperlipemia   . Morbid obesity (HCC)   . Vertigo     Past Surgical History:  Procedure Laterality Date  . INCISION AND DRAINAGE ABSCESS Right 12/15/2013   Procedure: INCISION AND DRAINAGE Right Posterior neck ABSCESS;  Surgeon: Cherylynn Ridges, MD;  Location: [facility] OR;  Service: General;  Laterality: Right;  . INCISION AND DRAINAGE DEEP NECK ABSCESS     Hidradenitis - 3-4 times at  [facility] [place]    Family History  Problem Relation Age of Onset  . Arthritis Mother   . Hypertension Mother     Social History  Substance Use Topics  . Smoking status: Former Smoker    Packs/day: 1.00    Quit date: 11/27/2013  . Smokeless tobacco: Never Used  . Alcohol use 0.6 oz/week    1 Cans of beer per week     Comment: social    Prior to Admission medications   Medication Sig Start Date End Date Taking? Authorizing Provider  acetaminophen (TYLENOL) 325 MG tablet Take 2 tablets (650 mg total) by mouth every 8 (eight) hours. 02/27/16   Marily Memos, MD  atorvastatin  (LIPITOR) 10 MG tablet  09/18/15   Historical Provider, MD  cephALEXin (KEFLEX) 500 MG capsule 2 caps po bid x 10 days 02/27/16   Marily Memos, MD  metFORMIN (GLUCOPHAGE) 500 MG tablet Take by mouth 2 (two) times daily with a meal.    Historical Provider, MD  oxyCODONE (ROXICODONE) 5 MG immediate release tablet Take 2-3 tablets (10-15 mg total) by mouth every 6 (six) hours as needed for severe pain. 02/27/16   Marily Memos, MD  oxyCODONE-acetaminophen (PERCOCET/ROXICET) 5-325 MG tablet Take 1-2 tablets by mouth every 6 (six) hours as needed for severe pain. 01/29/16   Benjiman Core, MD  sulfamethoxazole-trimethoprim (BACTRIM DS,SEPTRA DS) 800-160 MG tablet Take 1 tablet by mouth 2 (two) times daily. 02/27/16 03/08/16  Marily Memos, MD  traZODone (DESYREL) 100 MG tablet Take 100 mg by mouth at bedtime.    Historical Provider, MD    Allergies Review of patient's allergies indicates no known allergies.   REVIEW OF SYSTEMS  Negative except as noted here or in the History of Present Illness.   PHYSICAL EXAMINATION  Initial Vital Signs Blood pressure 127/87, pulse 98, temperature 98.3 F (36.8 C), temperature source Oral, resp. rate 20, SpO2 95 %.  Examination General: Well-developed, morbidly obese male in no acute distress; appearance consistent with age of record HENT: normocephalic; atraumatic Eyes: pupils equal, round and reactive  to light; extraocular muscles intact Neck: supple; tender, swollen area of the left posterior neck with healing incision Heart: regular rate and rhythm Lungs: clear to auscultation bilaterally Abdomen: soft; obese; nontender; bowel sounds present Extremities: No deformity; full range of motion; pulses normal Neurologic: Awake, alert and oriented; motor function intact in all extremities and symmetric; no facial droop Skin: Warm and dry Psychiatric: Normal mood and affect   RESULTS  Summary of this visit's results, reviewed by myself:   EKG  Interpretation  Date/Time:    Ventricular Rate:    PR Interval:    QRS Duration:   QT Interval:    QTC Calculation:   R Axis:     Text Interpretation:        Laboratory Studies: Results for orders placed or performed during the hospital encounter of 02/29/16 (from the past 24 hour(s))  CBC with Differential/Platelet     Status: None   Collection Time: 02/29/16  4:00 AM  Result Value Ref Range   WBC 8.6 4.0 - 10.5 K/uL   RBC 4.87 4.22 - 5.81 MIL/uL   Hemoglobin 14.8 13.0 - 17.0 g/dL   HCT 40.942.3 81.139.0 - 91.452.0 %   MCV 86.9 78.0 - 100.0 fL   MCH 30.4 26.0 - 34.0 pg   MCHC 35.0 30.0 - 36.0 g/dL   RDW 78.212.3 95.611.5 - 21.315.5 %   Platelets 181 150 - 400 K/uL   Neutrophils Relative % 63 %   Neutro Abs 5.4 1.7 - 7.7 K/uL   Lymphocytes Relative 26 %   Lymphs Abs 2.2 0.7 - 4.0 K/uL   Monocytes Relative 8 %   Monocytes Absolute 0.7 0.1 - 1.0 K/uL   Eosinophils Relative 3 %   Eosinophils Absolute 0.3 0.0 - 0.7 K/uL   Basophils Relative 0 %   Basophils Absolute 0.0 0.0 - 0.1 K/uL  Basic metabolic panel     Status: Abnormal   Collection Time: 02/29/16  4:00 AM  Result Value Ref Range   Sodium 133 (L) 135 - 145 mmol/L   Potassium 4.0 3.5 - 5.1 mmol/L   Chloride 101 101 - 111 mmol/L   CO2 24 22 - 32 mmol/L   Glucose, Bld 259 (H) 65 - 99 mg/dL   BUN 11 6 - 20 mg/dL   Creatinine, Ser 0.860.77 0.61 - 1.24 mg/dL   Calcium 8.6 (L) 8.9 - 10.3 mg/dL   GFR calc non Af Amer >60 >60 mL/min   GFR calc Af Amer >60 >60 mL/min   Anion gap 8 5 - 15   Imaging Studies: Ct Soft Tissue Neck W Contrast  Result Date: 02/29/2016 CLINICAL DATA:  40 y/o M; 16 years with chronic neck abscess post incision and drainage 2 days ago. EXAM: CT NECK WITH CONTRAST TECHNIQUE: Multidetector CT imaging of the neck was performed using the standard protocol following the bolus administration of intravenous contrast. CONTRAST:  100mL ISOVUE-300 IOPAMIDOL (ISOVUE-300) INJECTION 61% COMPARISON:  12/15/2013 CT of the neck FINDINGS:  Pharynx and larynx: Normal. No mass or swelling. Salivary glands: No inflammation, mass, or stone. Thyroid: Normal. Lymph nodes: Occipital and Left posterior cervical lymphadenopathy is likely reactive. Vascular: Negative. Limited intracranial: Negative. Visualized orbits: Negative. Mastoids and visualized paranasal sinuses: Clear. Skeleton: No acute or aggressive process. Upper chest: Negative. Other: There is a fluid collection in the left posterior lower neck subcutaneous fat measuring 13 x 38 x 11 mm (AP x ML x CC), series 4, image 56 and series 10, image 91. There  is extensive surrounding stranding of subcutaneous fat and dermal thickening extending to the left suprascapular region inferiorly, the left posterior cervical triangle anteriorly, and across the midline to soft tissues of the right posterior neck. Additionally, there is minimal stranding within the left-sided paraspinal muscles posterior deep IMPRESSION: 1. Draining fluid collection at the left posterior lower neck subcutaneous fat measuring up to 38 mm is consistent with abscess. 2. Surrounding inflammatory changes throughout the dermis and subcutaneous fat of the posterior neck and left suprascapular region, also extending into deep cervical spaces of the left posterior triangle and left paraspinal muscles may be reactive or represent associated cellulitis. Electronically Signed   By: Mitzi Hansen M.D.   On: 02/29/2016 05:26    ED COURSE  Nursing notes and initial vitals signs, including pulse oximetry, reviewed.  Vitals:   02/29/16 0240 02/29/16 0459  BP: 127/87 129/78  Pulse: 98 88  Resp: 20 18  Temp: 98.3 F (36.8 C)   TempSrc: Oral   SpO2: 95% 96%   5:52 AM Given CT findings consistent with cellulitis surrounding the abscess will have him admitted. I&D was performed without a significant release of pus. He may need further exploration in a controlled surgical situation. Levaquin 750 milligrams IV ordered for  complicated skin infection.  PROCEDURES   INCISION AND DRAINAGE Performed by: Paula Libra L Consent: Verbal consent obtained. Risks and benefits: risks, benefits and alternatives were discussed Type: abscess  Body area: Back of neck  Anesthesia: local infiltration  Incision was made with a scalpel.  Local anesthetic: lidocaine 2 % with epinephrine  Anesthetic total: 4 ml  Complexity: complex Blunt dissection to break up loculations  Drainage: purulent  Drainage amount: Small   Packing material: 1/2 in iodoform gauze  Patient tolerance: Patient tolerated the procedure well with no immediate complications. Swab sent for abscess culture.    ED DIAGNOSES     ICD-9-CM ICD-10-CM   1. Cellulitis and abscess of neck 682.1 L03.221     L02.11        Paula Libra, MD 02/29/16 [phone]

## 2016-02-29 NOTE — ED Triage Notes (Signed)
Pt states seen here for same 2days ago, abscess to back of lt side of neck was opened and drained, states draining more, swelling and tightness. Pt states pain is worse, this has been chronic for over 6857yrs

## 2016-02-29 NOTE — Progress Notes (Addendum)
Pharmacy Antibiotic Note  Albert PetrinSteven T Coppolino is a 40 y.o. male admitted on 02/29/2016 with neck abscess. S/p I& D in ED on 10/11, sent home 10/11 on on Keflex and Bactrim which he states he took as directed, but not improving so returned to ED 10/13. Pharmacy has been consulted for Vancomycin  Dosing for neck cellultis.   WBC 8.6, afeb, Tm 98.7, SCr 0.77, CrCl >14400ml/min, obese 40 y.o male Wt 175 kg  Plan: Vancomycin 1gm x1 given (infusing now).  Will give additional 1500 mg IV vancomycin after the 1gm infused for a total load of 2500 mg IV vancomcyin. Then Vancomycin 1500 mg IV q8h Monitor clinical status, renal function, vancomycin trough at steady state.  Height: 5' 10.5" (179.1 cm) Weight: (!) 385 lb 11.2 oz (175 kg) IBW/kg (Calculated) : 74.15  Temp (24hrs), Avg:98.5 F (36.9 C), Min:98.3 F (36.8 C), Max:98.7 F (37.1 C)   Recent Labs Lab 02/29/16 0400  WBC 8.6  CREATININE 0.77    Estimated Creatinine Clearance: 200.8 mL/min (by C-G formula based on SCr of 0.77 mg/dL).    No Known Allergies  Antimicrobials this admission: 10/13 Vanc>> 10/13 Levaquin x 1 dose only   Dose adjustments this admission:   Microbiology results: 10/13 Wound neck:  rare GPC pairs, rare GPR   Thank you for allowing pharmacy to be a part of this patient's care. Noah Delaineuth Charistopher Rumble, RPh Clinical Pharmacist 02/29/2016 12:19 PM   ADDENDUM:  Lovenox VTE prophylaxis dose of 40mg  SQ q24h ordered with note that pharmacy may adjust dose due to obesity BMI >30.  Pt's weight is 175 kg, Height 835feet 10.5 inches, BMI = 54.7 SCR 0.77, CRCL >100 ml/min H/H 14.8/42.3 wnl, PLTC 181k wnl No bleeding noted.   Plan: Iincrease lovenox to ~0.5 mg/kg =90 mg SQ q24h  Monitor for s/sx of bleeding  Noah Delaineuth Norwin Aleman, RPh Clinical Pharmacist 02/29/2016 12:29 PM

## 2016-02-29 NOTE — Progress Notes (Signed)
Patient ID: Charlyne PetrinSteven T Flippen, male   DOB: 1975/12/20, 40 y.o.   MRN: [id number]   LOS: 1 day   Subjective: Viviann SpareSteven is well known to the general surgery service after suffering multiple neck abscesses. This episode started about a month ago but got acutely worse Wednesday. He went to the ED and received an I&D, Kelfex, and Bactrim. He felt like he became worse and returned to the ED today where he received a second I&D and was admitted for IV abx. A CT scan shows a small abscess under the I&D site that is confluent and draining and extensive cellulitis. Cultures are pending.   Objective: Vital signs in last 24 hours: Temp:  [98.3 F (36.8 C)-98.7 F (37.1 C)] 98.7 F (37.1 C) (10/13 0900) Pulse Rate:  [88-98] 90 (10/13 0900) Resp:  [18-20] 18 (10/13 0900) BP: (125-139)/(78-87) 139/82 (10/13 0900) SpO2:  [95 %-98 %] 95 % (10/13 0900) Weight:  [175 kg (385 lb 11.2 oz)] 175 kg (385 lb 11.2 oz) (10/13 0900)     Laboratory  CBC  Recent Labs  02/29/16 0400  WBC 8.6  HGB 14.8  HCT 42.3  PLT 181   BMET  Recent Labs  02/29/16 0400  NA 133*  K 4.0  CL 101  CO2 24  GLUCOSE 259*  BUN 11  CREATININE 0.77  CALCIUM 8.6*   CBG (last 3)   Recent Labs  02/29/16 0748 02/29/16 0908  GLUCAP 282* 245*    Physical Exam Neck: TTP superior to I&D site and posteriorly to midline or slightly further Back: Mod-to-severe TTP across upper back. Mild erythema. Body habitus makes differentiation difficult but I think mildly swollen as opposed to fluctuent Incision/Wound:Wound packed with 1/4" packing, bloody   Assessment/Plan: Cervical hidradenitis w/abscess -- On Levaquin/vanc. Will watch closely to make sure cellulitic areas don't suppurate and need drainage. Would recommend topical clindamycin to nape (not I&D site) long-term to try and minimize flares. Also recommend outpatient follow-up with [facility] to discuss surgical options. Needs aggressive glucose and pain control.  We will follow  along.    Freeman CaldronMichael J. Isabel Freese, PA-C Pager: [phone] 02/29/2016

## 2016-02-29 NOTE — ED Notes (Signed)
I&D of abscess on back of neck done by Dr. Read DriversMolpus with packing placed.

## 2016-02-29 NOTE — ED Notes (Signed)
MD at bedside. 

## 2016-03-01 DIAGNOSIS — Z8249 Family history of ischemic heart disease and other diseases of the circulatory system: Secondary | ICD-10-CM | POA: Diagnosis not present

## 2016-03-01 DIAGNOSIS — E785 Hyperlipidemia, unspecified: Secondary | ICD-10-CM

## 2016-03-01 DIAGNOSIS — E1165 Type 2 diabetes mellitus with hyperglycemia: Secondary | ICD-10-CM | POA: Diagnosis present

## 2016-03-01 DIAGNOSIS — L0211 Cutaneous abscess of neck: Secondary | ICD-10-CM | POA: Diagnosis present

## 2016-03-01 DIAGNOSIS — Z87891 Personal history of nicotine dependence: Secondary | ICD-10-CM | POA: Diagnosis not present

## 2016-03-01 DIAGNOSIS — Z7984 Long term (current) use of oral hypoglycemic drugs: Secondary | ICD-10-CM | POA: Diagnosis not present

## 2016-03-01 DIAGNOSIS — G4700 Insomnia, unspecified: Secondary | ICD-10-CM | POA: Diagnosis present

## 2016-03-01 DIAGNOSIS — L03221 Cellulitis of neck: Secondary | ICD-10-CM | POA: Diagnosis present

## 2016-03-01 DIAGNOSIS — L732 Hidradenitis suppurativa: Secondary | ICD-10-CM | POA: Diagnosis present

## 2016-03-01 DIAGNOSIS — L03811 Cellulitis of head [any part, except face]: Secondary | ICD-10-CM | POA: Diagnosis not present

## 2016-03-01 DIAGNOSIS — L0291 Cutaneous abscess, unspecified: Secondary | ICD-10-CM | POA: Diagnosis not present

## 2016-03-01 DIAGNOSIS — Z23 Encounter for immunization: Secondary | ICD-10-CM | POA: Diagnosis not present

## 2016-03-01 DIAGNOSIS — B9689 Other specified bacterial agents as the cause of diseases classified elsewhere: Secondary | ICD-10-CM | POA: Diagnosis not present

## 2016-03-01 DIAGNOSIS — Z8261 Family history of arthritis: Secondary | ICD-10-CM | POA: Diagnosis not present

## 2016-03-01 DIAGNOSIS — E119 Type 2 diabetes mellitus without complications: Secondary | ICD-10-CM | POA: Diagnosis not present

## 2016-03-01 DIAGNOSIS — H8110 Benign paroxysmal vertigo, unspecified ear: Secondary | ICD-10-CM | POA: Diagnosis present

## 2016-03-01 DIAGNOSIS — Z6841 Body Mass Index (BMI) 40.0 and over, adult: Secondary | ICD-10-CM | POA: Diagnosis not present

## 2016-03-01 LAB — GLUCOSE, CAPILLARY
GLUCOSE-CAPILLARY: 203 mg/dL — AB (ref 65–99)
GLUCOSE-CAPILLARY: 169 mg/dL — AB (ref 65–99)
GLUCOSE-CAPILLARY: 218 mg/dL — AB (ref 65–99)
Glucose-Capillary: 195 mg/dL — ABNORMAL HIGH (ref 65–99)

## 2016-03-01 LAB — BASIC METABOLIC PANEL
ANION GAP: 7 (ref 5–15)
BUN: 9 mg/dL (ref 6–20)
CO2: 25 mmol/L (ref 22–32)
Calcium: 8.2 mg/dL — ABNORMAL LOW (ref 8.9–10.3)
Chloride: 103 mmol/L (ref 101–111)
Creatinine, Ser: 0.69 mg/dL (ref 0.61–1.24)
Glucose, Bld: 205 mg/dL — ABNORMAL HIGH (ref 65–99)
POTASSIUM: 4.2 mmol/L (ref 3.5–5.1)
SODIUM: 135 mmol/L (ref 135–145)

## 2016-03-01 LAB — HEMOGLOBIN A1C
HEMOGLOBIN A1C: 8.6 % — AB (ref 4.8–5.6)
Mean Plasma Glucose: 200 mg/dL

## 2016-03-01 MED ORDER — INFLUENZA VAC SPLIT QUAD 0.5 ML IM SUSY
0.5000 mL | PREFILLED_SYRINGE | Freq: Once | INTRAMUSCULAR | Status: AC
  Administered 2016-03-01: 0.5 mL via INTRAMUSCULAR
  Filled 2016-03-01: qty 0.5

## 2016-03-01 NOTE — Progress Notes (Signed)
Subjective: No complaints.   Objective: Vital signs in last 24 hours: Temp:  [97.9 F (36.6 C)-98.7 F (37.1 C)] 97.9 F (36.6 C) (10/13 2039) Pulse Rate:  [87-94] 87 (10/13 2039) Resp:  [18-20] 20 (10/13 2039) BP: (126-132)/(65-66) 126/66 (10/13 2039) SpO2:  [94 %-96 %] 96 % (10/13 2039) Weight:  [176.1 kg (388 lb 4.8 oz)] 176.1 kg (388 lb 4.8 oz) (10/13 2039) Last BM Date: 02/28/16  Intake/Output from previous day: 10/13 0701 - 10/14 0700 In: 2850 [P.O.:1800; I.V.:250; IV Piggyback:800] Out: 2075 [Urine:2075] Intake/Output this shift: Total I/O In: 240 [P.O.:240] Out: 0   Alert, nontoxic Posterior neck, left sided with cellulitis, induration, TTP in area No fluctuance  Lab Results:   Recent Labs  02/29/16 0400 02/29/16 1136  WBC 8.6 6.0  HGB 14.8 14.1  HCT 42.3 41.1  PLT 181 166   BMET  Recent Labs  02/29/16 0400 02/29/16 1136  NA 133*  --   K 4.0  --   CL 101  --   CO2 24  --   GLUCOSE 259*  --   BUN 11  --   CREATININE 0.77 0.93  CALCIUM 8.6*  --    PT/INR No results for input(s): LABPROT, INR in the last 72 hours. ABG No results for input(s): PHART, HCO3 in the last 72 hours.  Invalid input(s): PCO2, PO2  Studies/Results: Ct Soft Tissue Neck W Contrast  Result Date: 02/29/2016 CLINICAL DATA:  40 y/o M; 16 years with chronic neck abscess post incision and drainage 2 days ago. EXAM: CT NECK WITH CONTRAST TECHNIQUE: Multidetector CT imaging of the neck was performed using the standard protocol following the bolus administration of intravenous contrast. CONTRAST:  100mL ISOVUE-300 IOPAMIDOL (ISOVUE-300) INJECTION 61% COMPARISON:  12/15/2013 CT of the neck FINDINGS: Pharynx and larynx: Normal. No mass or swelling. Salivary glands: No inflammation, mass, or stone. Thyroid: Normal. Lymph nodes: Occipital and Left posterior cervical lymphadenopathy is likely reactive. Vascular: Negative. Limited intracranial: Negative. Visualized orbits: Negative.  Mastoids and visualized paranasal sinuses: Clear. Skeleton: No acute or aggressive process. Upper chest: Negative. Other: There is a fluid collection in the left posterior lower neck subcutaneous fat measuring 13 x 38 x 11 mm (AP x ML x CC), series 4, image 56 and series 10, image 91. There is extensive surrounding stranding of subcutaneous fat and dermal thickening extending to the left suprascapular region inferiorly, the left posterior cervical triangle anteriorly, and across the midline to soft tissues of the right posterior neck. Additionally, there is minimal stranding within the left-sided paraspinal muscles posterior deep IMPRESSION: 1. Draining fluid collection at the left posterior lower neck subcutaneous fat measuring up to 38 mm is consistent with abscess. 2. Surrounding inflammatory changes throughout the dermis and subcutaneous fat of the posterior neck and left suprascapular region, also extending into deep cervical spaces of the left posterior triangle and left paraspinal muscles may be reactive or represent associated cellulitis. Electronically Signed   By: Mitzi HansenLance  Furusawa-Stratton M.D.   On: 02/29/2016 05:26    Anti-infectives: Anti-infectives    Start     Dose/Rate Route Frequency Ordered Stop   02/29/16 2200  vancomycin (VANCOCIN) 1,500 mg in sodium chloride 0.9 % 500 mL IVPB     1,500 mg 250 mL/hr over 120 Minutes Intravenous Every 8 hours 02/29/16 1201     02/29/16 1215  vancomycin (VANCOCIN) 1,500 mg in sodium chloride 0.9 % 500 mL IVPB  Status:  Discontinued     1,500 mg 250  mL/hr over 120 Minutes Intravenous  Once 02/29/16 1152 02/29/16 1153   02/29/16 1215  vancomycin (VANCOCIN) 1,500 mg in sodium chloride 0.9 % 500 mL IVPB     1,500 mg 250 mL/hr over 120 Minutes Intravenous  Once 02/29/16 1153 02/29/16 1450   02/29/16 1000  vancomycin (VANCOCIN) IVPB 1000 mg/200 mL premix     1,000 mg 200 mL/hr over 60 Minutes Intravenous  Once 02/29/16 0957 02/29/16 1214   02/29/16 0600   levofloxacin (LEVAQUIN) IVPB 750 mg     750 mg 100 mL/hr over 90 Minutes Intravenous  Once 02/29/16 0551 02/29/16 0758      Assessment/Plan: Cervical hidradenitis w/abscess Cont current wound care Cont abx iv  Agree with topical clindamycin solution to area Aggressive diabetes control Still may need surgical debridement  Mary Sella. Andrey Campanile, MD, FACS General, Bariatric, & Minimally Invasive Surgery [facility], [place]    LOS: 1 day    Atilano Ina 03/01/2016

## 2016-03-01 NOTE — Progress Notes (Addendum)
PROGRESS NOTE    Albert PetrinSteven T White  YQM:[id number] DOB: [birth year] DOA: 02/29/2016 PCP: Alysia PennaHOLWERDA, SCOTT, MD     Brief Narrative:  Albert PetrinSteven T White is a 40 y.o. male with past mental history significant for diabetes and neck abscess in the past on left. Patient states the symptoms he has of neck pain swelling have been going on for about a month. Symptoms can acutely worsened on Wednesday. Patient went to the emergency room for evaluation where the abscess was I&D. Patient was sent home on Keflex and Bactrim which he states he took as directed. Patient did not have much improvement and actually felt that is getting worse over return to the emergency room. CT was obtained in ED which showed deep neck abscess and had a repeat I&D in Ed. He was started on levaquin and vanco.   Assessment & Plan:   Principal Problem:   Cellulitis Active Problems:   Abscess   DM (diabetes mellitus) (HCC)   HLD (hyperlipidemia)   Benign paroxysmal positional vertigo   Neck abscess   Posterior neck abscess with cellulitis -Dx of hidradenitis suppurativa in the past  -Status post I&D 2 (on Wednesday, then again Friday)  -Failed outpatient antibiotic treatment of Bactrim and Keflex -Currently on vancomycin  -Appreciate general surgery -Wound care   Diabetes type 2, non-insulin dependent, uncontrolled -Ha1c 8.6 -Hold metformin, will increase dose to max at time of discharge  -SSI for now, will need better outpatient management of DM  -Consult DM coordinator   Hyperlipidemia -Continue statin  Insomnia -Trazodone qhs   DVT prophylaxis: lovenox Code Status: full Family Communication: no family at beside Disposition Plan: home when improved    Consultants:   General surgery  Procedures:   I&D in the ED  Antimicrobials:   Vanco 10/13 >>  Levaquin 10/13 >>   Subjective: Patient denies any fevers, chest pain, cough, shortness of breath, nausea, vomiting, diarrhea, abdominal pain,  dysuria. Patient's main complaint remains pain and swelling in his posterior neck. He has apparently had intermittent infection of bilateral posterior neck since he was 17 and has been dx with hydradenitis suppurativa.    Objective: Vitals:   02/29/16 0752 02/29/16 0900 02/29/16 1655 02/29/16 2039  BP: 125/87 139/82 132/65 126/66  Pulse: 90 90 94 87  Resp: 18 18 18 20   Temp:  98.7 F (37.1 C) 98.7 F (37.1 C) 97.9 F (36.6 C)  TempSrc:  Oral Oral Oral  SpO2: 98% 95% 94% 96%  Weight:  (!) 175 kg (385 lb 11.2 oz)  (!) 176.1 kg (388 lb 4.8 oz)  Height:  5' 10.5" (1.791 m)      Intake/Output Summary (Last 24 hours) at 03/01/16 0951 Last data filed at 03/01/16 0830  Gross per 24 hour  Intake             2670 ml  Output             1275 ml  Net             1395 ml   Filed Weights   02/29/16 0900 02/29/16 2039  Weight: (!) 175 kg (385 lb 11.2 oz) (!) 176.1 kg (388 lb 4.8 oz)    Examination:  General exam: Appears calm and comfortable  Respiratory system: Clear to auscultation. Respiratory effort normal. Cardiovascular system: S1 & S2 heard, RRR. No JVD, murmurs, rubs, gallops or clicks. No pedal edema. Gastrointestinal system: Abdomen is nondistended, soft and nontender. No organomegaly or masses felt. Normal bowel  sounds heard. Central nervous system: Alert and oriented. No focal neurological deficits. Extremities: Symmetric 5 x 5 power. Skin: left posterior neck and scalp with tenderness to palpation, erythema extending up to scalp, and anteriorly to mastoid process, dried blood at site of I&D but no gross drainage on my exam  Psychiatry: Judgement and insight appear normal. Mood & affect appropriate.   Data Reviewed: I have personally reviewed following labs and imaging studies  CBC:  Recent Labs Lab 02/29/16 0400 02/29/16 1136  WBC 8.6 6.0  NEUTROABS 5.4  --   HGB 14.8 14.1  HCT 42.3 41.1  MCV 86.9 86.9  PLT 181 166   Basic Metabolic Panel:  Recent Labs Lab  02/29/16 0400 02/29/16 1136  NA 133*  --   K 4.0  --   CL 101  --   CO2 24  --   GLUCOSE 259*  --   BUN 11  --   CREATININE 0.77 0.93  CALCIUM 8.6*  --    GFR: Estimated Creatinine Clearance: 173.5 mL/min (by C-G formula based on SCr of 0.93 mg/dL). Liver Function Tests: No results for input(s): AST, ALT, ALKPHOS, BILITOT, PROT, ALBUMIN in the last 168 hours. No results for input(s): LIPASE, AMYLASE in the last 168 hours. No results for input(s): AMMONIA in the last 168 hours. Coagulation Profile: No results for input(s): INR, PROTIME in the last 168 hours. Cardiac Enzymes: No results for input(s): CKTOTAL, CKMB, CKMBINDEX, TROPONINI in the last 168 hours. BNP (last 3 results) No results for input(s): PROBNP in the last 8760 hours. HbA1C:  Recent Labs  02/29/16 1136  HGBA1C 8.6*   CBG:  Recent Labs Lab 02/29/16 0908 02/29/16 1146 02/29/16 1654 02/29/16 2041 03/01/16 0745  GLUCAP 245* 312* 181* 299* 203*   Lipid Profile: No results for input(s): CHOL, HDL, LDLCALC, TRIG, CHOLHDL, LDLDIRECT in the last 72 hours. Thyroid Function Tests: No results for input(s): TSH, T4TOTAL, FREET4, T3FREE, THYROIDAB in the last 72 hours. Anemia Panel: No results for input(s): VITAMINB12, FOLATE, FERRITIN, TIBC, IRON, RETICCTPCT in the last 72 hours. Sepsis Labs: No results for input(s): PROCALCITON, LATICACIDVEN in the last 168 hours.  Recent Results (from the past 240 hour(s))  Wound or Superficial Culture     Status: None (Preliminary result)   Collection Time: 02/29/16  5:48 AM  Result Value Ref Range Status   Specimen Description NECK  Final   Special Requests NONE  Final   Gram Stain   Final    FEW WBC PRESENT, PREDOMINANTLY PMN FEW SQUAMOUS EPITHELIAL CELLS PRESENT RARE GRAM POSITIVE COCCI IN PAIRS RARE GRAM POSITIVE RODS    Culture   Final    CULTURE REINCUBATED FOR BETTER GROWTH Performed at [facility]    Report Status PENDING  Incomplete        Radiology Studies: Ct Soft Tissue Neck W Contrast  Result Date: 02/29/2016 CLINICAL DATA:  40 y/o M; 17 years with chronic neck abscess post incision and drainage 2 days ago. EXAM: CT NECK WITH CONTRAST TECHNIQUE: Multidetector CT imaging of the neck was performed using the standard protocol following the bolus administration of intravenous contrast. CONTRAST:  ISOVUE-300 IOPAMIDOL (ISOVUE-300) INJECTION 61% COMPARISON:  12/15/2013 CT of the neck FINDINGS: Pharynx and larynx: Normal. No mass or swelling. Salivary glands: No inflammation, mass, or stone. Thyroid: Normal. Lymph nodes: Occipital and Left posterior cervical lymphadenopathy is likely reactive. Vascular: Negative. Limited intracranial: Negative. Visualized orbits: Negative. Mastoids and visualized paranasal sinuses: Clear. Skeleton: No acute  or aggressive process. Upper chest: Negative. Other: There is a fluid collection in the left posterior lower neck subcutaneous fat measuring 13 x 38 x 11 mm (AP x ML x CC), series 4, image 56 and series 10, image 91. There is extensive surrounding stranding of subcutaneous fat and dermal thickening extending to the left suprascapular region inferiorly, the left posterior cervical triangle anteriorly, and across the midline to soft tissues of the right posterior neck. Additionally, there is minimal stranding within the left-sided paraspinal muscles posterior deep IMPRESSION: 1. Draining fluid collection at the left posterior lower neck subcutaneous fat measuring up to 38 mm is consistent with abscess. 2. Surrounding inflammatory changes throughout the dermis and subcutaneous fat of the posterior neck and left suprascapular region, also extending into deep cervical spaces of the left posterior triangle and left paraspinal muscles may be reactive or represent associated cellulitis. Electronically Signed   By: Mitzi Hansen M.D.   On: 02/29/2016 05:26      Scheduled Meds: . atorvastatin   10 mg Oral q1800  . enoxaparin (LOVENOX) injection  40 mg Subcutaneous Q24H  . enoxaparin (LOVENOX) injection  0.5 mg/kg Subcutaneous Q24H  . Influenza vac split quadrivalent PF  0.5 mL Intramuscular Once  . insulin aspart  0-20 Units Subcutaneous TID WC  . traZODone  100 mg Oral QHS  . vancomycin  1,500 mg Intravenous Q8H   Continuous Infusions:    LOS: 1 day    Time spent: 40 minutes   Noralee Stain, DO [facility] [email or web address] Password [email or web address] 03/01/2016, 9:54 AM

## 2016-03-02 LAB — CBC WITH DIFFERENTIAL/PLATELET
BASOS PCT: 0 %
Basophils Absolute: 0 10*3/uL (ref 0.0–0.1)
EOS ABS: 0.3 10*3/uL (ref 0.0–0.7)
Eosinophils Relative: 5 %
HEMATOCRIT: 39.4 % (ref 39.0–52.0)
HEMOGLOBIN: 13.3 g/dL (ref 13.0–17.0)
LYMPHS ABS: 2 10*3/uL (ref 0.7–4.0)
Lymphocytes Relative: 39 %
MCH: 29.6 pg (ref 26.0–34.0)
MCHC: 33.8 g/dL (ref 30.0–36.0)
MCV: 87.8 fL (ref 78.0–100.0)
MONOS PCT: 7 %
Monocytes Absolute: 0.4 10*3/uL (ref 0.1–1.0)
NEUTROS ABS: 2.5 10*3/uL (ref 1.7–7.7)
NEUTROS PCT: 49 %
Platelets: 169 10*3/uL (ref 150–400)
RBC: 4.49 MIL/uL (ref 4.22–5.81)
RDW: 12.6 % (ref 11.5–15.5)
WBC: 5.2 10*3/uL (ref 4.0–10.5)

## 2016-03-02 LAB — BASIC METABOLIC PANEL WITH GFR
Anion gap: 5 (ref 5–15)
BUN: 7 mg/dL (ref 6–20)
CO2: 28 mmol/L (ref 22–32)
Calcium: 8.4 mg/dL — ABNORMAL LOW (ref 8.9–10.3)
Chloride: 103 mmol/L (ref 101–111)
Creatinine, Ser: 0.74 mg/dL (ref 0.61–1.24)
GFR calc Af Amer: >60 mL/min
GFR calc non Af Amer: >60 mL/min
Glucose, Bld: 159 mg/dL — ABNORMAL HIGH (ref 65–99)
Potassium: 4 mmol/L (ref 3.5–5.1)
Sodium: 136 mmol/L (ref 135–145)

## 2016-03-02 LAB — GLUCOSE, CAPILLARY
GLUCOSE-CAPILLARY: 142 mg/dL — AB (ref 65–99)
GLUCOSE-CAPILLARY: 136 mg/dL — AB (ref 65–99)
GLUCOSE-CAPILLARY: 272 mg/dL — AB (ref 65–99)
Glucose-Capillary: 159 mg/dL — ABNORMAL HIGH (ref 65–99)

## 2016-03-02 LAB — VANCOMYCIN, TROUGH: Vancomycin Tr: 17 ug/mL (ref 15–20)

## 2016-03-02 NOTE — Progress Notes (Signed)
PROGRESS NOTE    RONEY YOUTZ  ZOX:[id number] DOB: [birth year] DOA: 02/29/2016 PCP: Alysia Penna, MD     Brief Narrative:  Albert White is a 40 y.o. male with past mental history significant for diabetes and neck abscess in the past on left. Patient states the symptoms he has of neck pain swelling have been going on for about a month. Symptoms can acutely worsened on Wednesday. Patient went to the emergency room for evaluation where the abscess was I&D. Patient was sent home on Keflex and Bactrim which he states he took as directed. Patient did not have much improvement and actually felt that is getting worse over return to the emergency room. CT was obtained in ED which showed deep neck abscess and had a repeat I&D in Ed. He was started on levaquin and vanco.   Assessment & Plan:   Principal Problem:   Cellulitis Active Problems:   Hidradenitis suppurativa, neck and scalp   DM (diabetes mellitus) (HCC)   HLD (hyperlipidemia)   Neck abscess   Posterior neck abscess with cellulitis -Dx of hidradenitis suppurativa in the past  -Status post I&D 2 (on Wednesday, then again Friday)  -Failed outpatient antibiotic treatment of Bactrim and Keflex -Currently on vancomycin  -Appreciate general surgery - debridement?  -Wound care   Diabetes type 2, non-insulin dependent, uncontrolled -Ha1c 8.6 -Hold metformin, will increase dose to max at time of discharge  -SSI for now, will need better outpatient management of DM  -Consult DM coordinator   Hyperlipidemia -Continue statin  Insomnia -Trazodone qhs   DVT prophylaxis: lovenox Code Status: full Family Communication: no family at beside Disposition Plan: home when improved    Consultants:   General surgery  Procedures:   I&D in the ED  Antimicrobials:   Vanco 10/13 >>   Subjective: Patient denies any fevers, chest pain, cough, shortness of breath, nausea, vomiting, diarrhea, abdominal pain, dysuria.  Patient's main complaint remains pain and swelling in his posterior neck, which is only minimally improved. He states that now he can rotate his head, but still having significant pain and also yellow-pus-like drainage    Objective: Vitals:   03/01/16 0955 03/01/16 1721 03/01/16 2100 03/02/16 0544  BP: 121/60 130/85 131/61 126/69  Pulse: 79 65 77 61  Resp: 14 16 18 18   Temp: 98 F (36.7 C) 98 F (36.7 C) 97.8 F (36.6 C) 97.6 F (36.4 C)  TempSrc: Oral Oral Oral Oral  SpO2: 98% 100% 97% 99%  Weight:   (!) 181.3 kg (399 lb 11.2 oz)   Height:        Intake/Output Summary (Last 24 hours) at 03/02/16 0803 Last data filed at 03/02/16 0556  Gross per 24 hour  Intake             2300 ml  Output             1150 ml  Net             1150 ml   Filed Weights   02/29/16 0900 02/29/16 2039 03/01/16 2100  Weight: (!) 175 kg (385 lb 11.2 oz) (!) 176.1 kg (388 lb 4.8 oz) (!) 181.3 kg (399 lb 11.2 oz)    Examination:  General exam: Appears calm and comfortable  Respiratory system: Clear to auscultation. Respiratory effort normal. Cardiovascular system: S1 & S2 heard, RRR. No JVD, murmurs, rubs, gallops or clicks. No pedal edema. Gastrointestinal system: Abdomen is nondistended, soft and nontender. No organomegaly or masses felt.  Normal bowel sounds heard. Central nervous system: Alert and oriented. No focal neurological deficits. Extremities: Symmetric 5 x 5 power. Skin: left posterior neck and scalp with tenderness to palpation, erythema extending up to scalp, and anteriorly to mastoid process, no gross drainage, but had some dried pus on bandage  Psychiatry: Judgement and insight appear normal. Mood & affect appropriate.   Data Reviewed: I have personally reviewed following labs and imaging studies  CBC:  Recent Labs Lab 02/29/16 0400 02/29/16 1136 03/02/16 0617  WBC 8.6 6.0 5.2  NEUTROABS 5.4  --  2.5  HGB 14.8 14.1 13.3  HCT 42.3 41.1 39.4  MCV 86.9 86.9 87.8  PLT 181 166  169   Basic Metabolic Panel:  Recent Labs Lab 02/29/16 0400 02/29/16 1136 03/01/16 0726 03/02/16 0617  NA 133*  --  135 136  K 4.0  --  4.2 4.0  CL 101  --  103 103  CO2 24  --  25 28  GLUCOSE 259*  --  205* 159*  BUN 11  --  9 7  CREATININE 0.77 0.93 0.69 0.74  CALCIUM 8.6*  --  8.2* 8.4*   GFR: Estimated Creatinine Clearance: 205.2 mL/min (by C-G formula based on SCr of 0.74 mg/dL). Liver Function Tests: No results for input(s): AST, ALT, ALKPHOS, BILITOT, PROT, ALBUMIN in the last 168 hours. No results for input(s): LIPASE, AMYLASE in the last 168 hours. No results for input(s): AMMONIA in the last 168 hours. Coagulation Profile: No results for input(s): INR, PROTIME in the last 168 hours. Cardiac Enzymes: No results for input(s): CKTOTAL, CKMB, CKMBINDEX, TROPONINI in the last 168 hours. BNP (last 3 results) No results for input(s): PROBNP in the last 8760 hours. HbA1C:  Recent Labs  02/29/16 1136  HGBA1C 8.6*   CBG:  Recent Labs Lab 03/01/16 0745 03/01/16 1207 03/01/16 1640 03/01/16 2103 03/02/16 0748  GLUCAP 203* 195* 169* 218* 142*   Lipid Profile: No results for input(s): CHOL, HDL, LDLCALC, TRIG, CHOLHDL, LDLDIRECT in the last 72 hours. Thyroid Function Tests: No results for input(s): TSH, T4TOTAL, FREET4, T3FREE, THYROIDAB in the last 72 hours. Anemia Panel: No results for input(s): VITAMINB12, FOLATE, FERRITIN, TIBC, IRON, RETICCTPCT in the last 72 hours. Sepsis Labs: No results for input(s): PROCALCITON, LATICACIDVEN in the last 168 hours.  Recent Results (from the past 240 hour(s))  Wound or Superficial Culture     Status: None (Preliminary result)   Collection Time: 02/29/16  5:48 AM  Result Value Ref Range Status   Specimen Description NECK  Final   Special Requests NONE  Final   Gram Stain   Final    FEW WBC PRESENT, PREDOMINANTLY PMN FEW SQUAMOUS EPITHELIAL CELLS PRESENT RARE GRAM POSITIVE COCCI IN PAIRS RARE GRAM POSITIVE RODS     Culture   Final    CULTURE REINCUBATED FOR BETTER GROWTH [facility]    Report Status PENDING  Incomplete       Radiology Studies: No results found.    Scheduled Meds: . atorvastatin  10 mg Oral q1800  . enoxaparin (LOVENOX) injection  0.5 mg/kg Subcutaneous Q24H  . insulin aspart  0-20 Units Subcutaneous TID WC  . traZODone  100 mg Oral QHS  . vancomycin  1,500 mg Intravenous Q8H   Continuous Infusions:    LOS: 2 days    Time spent: 25 minutes   Noralee StainJennifer Rutha Melgoza, DO [facility] [email or web address] Password [email or web address] 03/02/2016, 8:03 AM

## 2016-03-02 NOTE — Progress Notes (Signed)
Pharmacy Antibiotic Note  Charlyne PetrinSteven T White is a 40 y.o. male admitted on 02/29/2016 with an abscess on his neck.  He is on Day #3 of Vancomycin therapy and his Vancomycin trough is therapeutic.  Plan: Continue Vancomycin 1500mg  IV q8h Recheck Vancomycin trough in 2-3 days  Follow renal function and micro data  Height: 5' 10.5" (179.1 cm) Weight: (!) 399 lb 11.2 oz (181.3 kg) IBW/kg (Calculated) : 74.15  Temp (24hrs), Avg:98 F (36.7 C), Min:97.6 F (36.4 C), Max:98.4 F (36.9 C)   Recent Labs Lab 02/29/16 0400 02/29/16 1136 03/01/16 0726 03/02/16 0538 03/02/16 0617  WBC 8.6 6.0  --   --  5.2  CREATININE 0.77 0.93 0.69  --  0.74  VANCOTROUGH  --   --   --  17  --     Estimated Creatinine Clearance: 205.2 mL/min (by C-G formula based on SCr of 0.74 mg/dL).    No Known Allergies  Antimicrobials this admission: 10/13 Vanc>> 10/13 Levaquin x 1 dose only   Dose adjustments this admission:  10/15 Vanc Trough 17 on 1500 q8h  Microbiology results:  10/13 Wound neck:  reincubated  Thank you for allowing pharmacy to be a part of this patient's care.  Estella HuskMichelle Nawaf Strange, Pharm.D., BCPS, AAHIVP Clinical Pharmacist Phone: [phone] or [phone] 03/02/2016, 12:20 PM

## 2016-03-03 LAB — BASIC METABOLIC PANEL
Anion gap: 7 (ref 5–15)
BUN: 6 mg/dL (ref 6–20)
CHLORIDE: 102 mmol/L (ref 101–111)
CO2: 29 mmol/L (ref 22–32)
CREATININE: 0.73 mg/dL (ref 0.61–1.24)
Calcium: 8.6 mg/dL — ABNORMAL LOW (ref 8.9–10.3)
GFR calc non Af Amer: >60 mL/min (ref >60)
Glucose, Bld: 172 mg/dL — ABNORMAL HIGH (ref 65–99)
POTASSIUM: 4.1 mmol/L (ref 3.5–5.1)
Sodium: 138 mmol/L (ref 135–145)

## 2016-03-03 LAB — CBC WITH DIFFERENTIAL/PLATELET
BASOS ABS: 0 10*3/uL (ref 0.0–0.1)
BASOS PCT: 1 %
EOS PCT: 6 %
Eosinophils Absolute: 0.3 10*3/uL (ref 0.0–0.7)
HEMATOCRIT: 40.4 % (ref 39.0–52.0)
Hemoglobin: 13.7 g/dL (ref 13.0–17.0)
Lymphocytes Relative: 36 %
Lymphs Abs: 1.5 10*3/uL (ref 0.7–4.0)
MCH: 29.6 pg (ref 26.0–34.0)
MCHC: 33.9 g/dL (ref 30.0–36.0)
MCV: 87.3 fL (ref 78.0–100.0)
MONO ABS: 0.4 10*3/uL (ref 0.1–1.0)
MONOS PCT: 9 %
NEUTROS ABS: 2.1 10*3/uL (ref 1.7–7.7)
Neutrophils Relative %: 48 %
PLATELETS: 179 10*3/uL (ref 150–400)
RBC: 4.63 MIL/uL (ref 4.22–5.81)
RDW: 12.4 % (ref 11.5–15.5)
WBC: 4.3 10*3/uL (ref 4.0–10.5)

## 2016-03-03 LAB — GLUCOSE, CAPILLARY
GLUCOSE-CAPILLARY: 131 mg/dL — AB (ref 65–99)
GLUCOSE-CAPILLARY: 198 mg/dL — AB (ref 65–99)
Glucose-Capillary: 168 mg/dL — ABNORMAL HIGH (ref 65–99)
Glucose-Capillary: 186 mg/dL — ABNORMAL HIGH (ref 65–99)

## 2016-03-03 LAB — AEROBIC CULTURE W GRAM STAIN (SUPERFICIAL SPECIMEN): Culture: NORMAL

## 2016-03-03 LAB — AEROBIC CULTURE  (SUPERFICIAL SPECIMEN)

## 2016-03-03 MED ORDER — INSULIN GLARGINE 100 UNIT/ML ~~LOC~~ SOLN
20.0000 [IU] | Freq: Every day | SUBCUTANEOUS | Status: DC
  Administered 2016-03-03 – 2016-03-04 (×2): 20 [IU] via SUBCUTANEOUS
  Filled 2016-03-03 (×3): qty 0.2

## 2016-03-03 NOTE — Progress Notes (Signed)
  Subjective:  Stable and alert. No distress. Non-toxic appearing. Still reports neck pain but no drainage. Afebrile.  Heart rate 60.  W BC 4300.  Culture noted.  Rare gram-positive cocci in pairs, rare gram-positive rods Currently on vancomycin as monotherapy.  Objective: Vital signs in last 24 hours: Temp:  [97.6 F (36.4 C)-98.4 F (36.9 C)] 97.6 F (36.4 C) (10/16 0414) Pulse Rate:  [60-78] 60 (10/16 0414) Resp:  [18-19] 18 (10/16 0414) BP: (124-144)/(72-81) 144/81 (10/16 0414) SpO2:  [95 %-100 %] 100 % (10/16 0414) Last BM Date: 02/29/16  Intake/Output from previous day: 10/15 0701 - 10/16 0700 In: 1300 [P.O.:800; IV Piggyback:500] Out: 2400 [Urine:2400] Intake/Output this shift: No intake/output data recorded.  General appearance: Alert.  Mental status normal.  No distress. Neck: no adenopathy, no carotid bruit, no JVD, supple, symmetrical, trachea midline, thyroid not enlarged, symmetric, no tenderness/mass/nodules and Posterior and left neck with erythema and cellulitis.  No fluctuance.  No drainage.  No skin necrosis.  Lab Results:   Recent Labs  03/02/16 0617 03/03/16 0532  WBC 5.2 4.3  HGB 13.3 13.7  HCT 39.4 40.4  PLT 169 179   BMET  Recent Labs  03/02/16 0617 03/03/16 0532  NA 136 138  K 4.0 4.1  CL 103 102  CO2 28 29  GLUCOSE 159* 172*  BUN 7 6  CREATININE 0.74 0.73  CALCIUM 8.4* 8.6*   PT/INR No results for input(s): LABPROT, INR in the last 72 hours. ABG No results for input(s): PHART, HCO3 in the last 72 hours.  Invalid input(s): PCO2, PO2  Studies/Results: No results found.  Anti-infectives: Anti-infectives    Start     Dose/Rate Route Frequency Ordered Stop   02/29/16 2200  vancomycin (VANCOCIN) 1,500 mg in sodium chloride 0.9 % 500 mL IVPB     1,500 mg 250 mL/hr over 120 Minutes Intravenous Every 8 hours 02/29/16 1201     02/29/16 1215  vancomycin (VANCOCIN) 1,500 mg in sodium chloride 0.9 % 500 mL IVPB  Status:   Discontinued     1,500 mg 250 mL/hr over 120 Minutes Intravenous  Once 02/29/16 1152 02/29/16 1153   02/29/16 1215  vancomycin (VANCOCIN) 1,500 mg in sodium chloride 0.9 % 500 mL IVPB     1,500 mg 250 mL/hr over 120 Minutes Intravenous  Once 02/29/16 1153 02/29/16 1450   02/29/16 1000  vancomycin (VANCOCIN) IVPB 1000 mg/200 mL premix     1,000 mg 200 mL/hr over 60 Minutes Intravenous  Once 02/29/16 0957 02/29/16 1214   02/29/16 0600  levofloxacin (LEVAQUIN) IVPB 750 mg     750 mg 100 mL/hr over 90 Minutes Intravenous  Once 02/29/16 0551 02/29/16 0758      Assessment/Plan:  HD#4 Recurrent soft tissue infection of neck with abscess. Bedside exam now consistent with cellulitis No indication for debridement or drainage at this time Follow clinical course Consider follow-up CT neck in a day or 2. Recommend infectious disease consult due to recurrent nature of process  Morbid obesity Diabetes mellitus-poorly controlled    LOS: 3 days    Albert White 03/03/2016

## 2016-03-03 NOTE — Consult Note (Signed)
[facility]  Total days of antibiotics: 6               Reason for Consult: Recurrent Cellulitis   Referring Physician: Dr. Clifton James  Principal Problem:   Cellulitis Active Problems:   Hidradenitis suppurativa, neck and scalp   DM (diabetes mellitus) ([place])   HLD (hyperlipidemia)   Neck abscess   HPI: Albert White is a 40 y.o. male with PMHx of cervical hidradenitis suppurativa, morbid obesity, uncontrolled T2DM, HLD and chronic recurrent cellulitis who presented to the ED on 02/27/16 with complaint of a one month history of left sided posterior neck pain and swelling. Patient originally presented for this problem on 9/12 and underwent I&D for left posterior neck abscess in the ED and was discharged with Cephalexin 500 mg Q4H and Bactrim BID. Patient was evaluated again for a posterior neck abscess on 10/11 and underwent repeat I&D and restarted on Cephalexin 1000 mg BID for 10 days and Bactrim DS BID for 10 days. Patient was instructed to follow up with surgery for treatment of hidradenitis. However, patient felt his symptoms were worsening after discharge from the ED and he returned to the ED on 10/13. Given his worsening symptoms and clinical picture, patient was admitted for further treatment. CT neck on 10/13 confirmed a left posterior neck abscess 3.8 cm in size and surrounding cellulitis. He underwent a third I&D in the ED on 10/13. Wound culture grew rare gram + rods and rare gram + cocci in pairs, consistent with normal skin flora. Patient was originally started on Levaquin in the ED, but later transitioned to Vancomycin on 10/13. Patient was seen by General Surgery who recommended following area clinically, topical clindamycin to nape and continuing vancomycin. There are no indications for surgery at the current time and consider repeating CT neck in 1-2 days in addition to an ID consult.   Patient states he feels a little better since admission.  Pain is better controlled, but currently a 7/10. He denies recurrent drainage from the area, fever, chills, diaphoresis, nausea, vomiting or diarrhea. He denies any other areas of concern. He states this has been a recurrent problem for him for the past 16 years but was diagnosed with HS 3 years ago. He has not followed closely with a general surgeon or plastic surgeon. He saw a dermatologist once who agreed with the diagnosis and administered a painful cortisone shot (?) to the area and patient did not follow up. He states he only seems to improve from these flares once surgery does a wash out or places drains. He is nervous about going home because he feels he will end up back here. He has never had similar lesions in his axilla or groin.   Past Medical History:  Diagnosis Date  . Abscess   . Diabetes mellitus without complication ([place])   . Hidradenitis suppurativa   . Hyperlipemia   . Morbid obesity ([place])   . Vertigo     Allergies: No Known Allergies  Current antibiotics: Keflex: 9/12 >> 9/21 Bactrim: 9/12>> 9/21 Keflex: 10/11 >>10/12 Bactrim: 10/11 >> 10/12 Levaquin 10/13 >>10/13 Vancomycin 10/13 >>   MEDICATIONS: . atorvastatin  10 mg Oral q1800  . enoxaparin (LOVENOX) injection  0.5 mg/kg Subcutaneous Q24H  . insulin aspart  0-20 Units Subcutaneous TID WC  . insulin glargine  20 Units Subcutaneous QHS  . traZODone  100 mg Oral QHS  . vancomycin  1,500 mg Intravenous Q8H  Social History  Substance Use Topics  . Smoking status: Former Smoker    Packs/day: 1.00    Quit date: 11/27/2013  . Smokeless tobacco: Never Used  . Alcohol use 0.6 oz/week    1 Cans of beer per week     Comment: social    Family History  Problem Relation Age of Onset  . Arthritis Mother   . Hypertension Mother     Review of Systems: A complete ROS was negative except as per HPI.  General: Denies fever, chills, fatigue, unexpected weight loss and diaphoresis.  Respiratory: Denies SOB,  cough, DOE.   Cardiovascular: Denies chest pain and palpitations.  Gastrointestinal: Denies nausea, vomiting, abdominal pain, diarrhea.  Genitourinary: Denies dysuria, urgency Skin: Admits to left sided neck pain with swelling and redness.   Neurological: Denies dizziness, headaches, weakness, lightheadedness  OBJECTIVE: Vitals:   03/02/16 1800 03/02/16 2013 03/03/16 0414 03/03/16 0954  BP: 124/72 127/74 (!) 144/81 130/74  Pulse: 78 75 60 (!) 59  Resp: '18 19 18 18  ' Temp: 98 F (36.7 C) 98.2 F (36.8 C) 97.6 F (36.4 C) 97.7 F (36.5 C)  TempSrc: Oral Oral Oral Oral  SpO2: 95% 98% 100% 100%  Weight:      Height:       General: Vital signs reviewed.  Patient is obese, in no acute distress and cooperative with exam.  Neck: Supple, trachea midline. Cardiovascular: RRR, S1 normal, S2 normal, no murmurs, gallops, or rubs. Pulmonary/Chest: Clear to auscultation bilaterally, no wheezes, rales, or rhonchi. Abdominal: Soft, non-tender, non-distended, BS + Extremities: No lower extremity edema bilaterally, pulses symmetric and intact bilaterally.  Skin: Left posterior neck is tender to touch, erythematous with deep seated nodules, sinus tracts and rope-like scars without obvious drainage.  Psychiatric: Normal mood and affect. speech and behavior is normal. Cognition and memory are normal.   LABS: Results for orders placed or performed during the hospital encounter of 02/29/16 (from the past 48 hour(s))  Glucose, capillary     Status: Abnormal   Collection Time: 03/01/16  4:40 PM  Result Value Ref Range   Glucose-Capillary 169 (H) 65 - 99 mg/dL  Glucose, capillary     Status: Abnormal   Collection Time: 03/01/16  9:03 PM  Result Value Ref Range   Glucose-Capillary 218 (H) 65 - 99 mg/dL  Vancomycin, trough     Status: None   Collection Time: 03/02/16  5:38 AM  Result Value Ref Range   Vancomycin Tr 17 15 - 20 ug/mL  CBC with Differential/Platelet     Status: None   Collection Time:  03/02/16  6:17 AM  Result Value Ref Range   WBC 5.2 4.0 - 10.5 K/uL   RBC 4.49 4.22 - 5.81 MIL/uL   Hemoglobin 13.3 13.0 - 17.0 g/dL   HCT 39.4 39.0 - 52.0 %   MCV 87.8 78.0 - 100.0 fL   MCH 29.6 26.0 - 34.0 pg   MCHC 33.8 30.0 - 36.0 g/dL   RDW 12.6 11.5 - 15.5 %   Platelets 169 150 - 400 K/uL   Neutrophils Relative % 49 %   Neutro Abs 2.5 1.7 - 7.7 K/uL   Lymphocytes Relative 39 %   Lymphs Abs 2.0 0.7 - 4.0 K/uL   Monocytes Relative 7 %   Monocytes Absolute 0.4 0.1 - 1.0 K/uL   Eosinophils Relative 5 %   Eosinophils Absolute 0.3 0.0 - 0.7 K/uL   Basophils Relative 0 %   Basophils Absolute 0.0 0.0 -  0.1 K/uL  Basic metabolic panel     Status: Abnormal   Collection Time: 03/02/16  6:17 AM  Result Value Ref Range   Sodium 136 135 - 145 mmol/L   Potassium 4.0 3.5 - 5.1 mmol/L   Chloride 103 101 - 111 mmol/L   CO2 28 22 - 32 mmol/L   Glucose, Bld 159 (H) 65 - 99 mg/dL   BUN 7 6 - 20 mg/dL   Creatinine, Ser 0.74 0.61 - 1.24 mg/dL   Calcium 8.4 (L) 8.9 - 10.3 mg/dL   GFR calc non Af Amer >60 >60 mL/min   GFR calc Af Amer >60 >60 mL/min    Comment: (NOTE) The eGFR has been calculated using the CKD EPI equation. This calculation has not been validated in all clinical situations. eGFR's persistently <60 mL/min signify possible Chronic Kidney Disease.    Anion gap 5 5 - 15  Glucose, capillary     Status: Abnormal   Collection Time: 03/02/16  7:48 AM  Result Value Ref Range   Glucose-Capillary 142 (H) 65 - 99 mg/dL  Glucose, capillary     Status: Abnormal   Collection Time: 03/02/16 12:15 PM  Result Value Ref Range   Glucose-Capillary 159 (H) 65 - 99 mg/dL  Glucose, capillary     Status: Abnormal   Collection Time: 03/02/16  5:00 PM  Result Value Ref Range   Glucose-Capillary 136 (H) 65 - 99 mg/dL  Glucose, capillary     Status: Abnormal   Collection Time: 03/02/16  9:00 PM  Result Value Ref Range   Glucose-Capillary 272 (H) 65 - 99 mg/dL  CBC with Differential/Platelet      Status: None   Collection Time: 03/03/16  5:32 AM  Result Value Ref Range   WBC 4.3 4.0 - 10.5 K/uL   RBC 4.63 4.22 - 5.81 MIL/uL   Hemoglobin 13.7 13.0 - 17.0 g/dL   HCT 40.4 39.0 - 52.0 %   MCV 87.3 78.0 - 100.0 fL   MCH 29.6 26.0 - 34.0 pg   MCHC 33.9 30.0 - 36.0 g/dL   RDW 12.4 11.5 - 15.5 %   Platelets 179 150 - 400 K/uL   Neutrophils Relative % 48 %   Neutro Abs 2.1 1.7 - 7.7 K/uL   Lymphocytes Relative 36 %   Lymphs Abs 1.5 0.7 - 4.0 K/uL   Monocytes Relative 9 %   Monocytes Absolute 0.4 0.1 - 1.0 K/uL   Eosinophils Relative 6 %   Eosinophils Absolute 0.3 0.0 - 0.7 K/uL   Basophils Relative 1 %   Basophils Absolute 0.0 0.0 - 0.1 K/uL  Basic metabolic panel     Status: Abnormal   Collection Time: 03/03/16  5:32 AM  Result Value Ref Range   Sodium 138 135 - 145 mmol/L   Potassium 4.1 3.5 - 5.1 mmol/L   Chloride 102 101 - 111 mmol/L   CO2 29 22 - 32 mmol/L   Glucose, Bld 172 (H) 65 - 99 mg/dL   BUN 6 6 - 20 mg/dL   Creatinine, Ser 0.73 0.61 - 1.24 mg/dL   Calcium 8.6 (L) 8.9 - 10.3 mg/dL   GFR calc non Af Amer >60 >60 mL/min   GFR calc Af Amer >60 >60 mL/min    Comment: (NOTE) The eGFR has been calculated using the CKD EPI equation. This calculation has not been validated in all clinical situations. eGFR's persistently <60 mL/min signify possible Chronic Kidney Disease.    Anion gap 7  5 - 15  Glucose, capillary     Status: Abnormal   Collection Time: 03/03/16  7:45 AM  Result Value Ref Range   Glucose-Capillary 168 (H) 65 - 99 mg/dL  Glucose, capillary     Status: Abnormal   Collection Time: 03/03/16 12:31 PM  Result Value Ref Range   Glucose-Capillary 131 (H) 65 - 99 mg/dL    MICRO: Wound Cx: rare gram positive cocci in pairs and rare gram positive rods  Assessment/Plan:   with PMHx of hidradenitis suppurativa, morbid obesity, uncontrolled T2DM, HLD and chronic recurrent cellulitis who presented to the ED on 02/27/16 with complaint of a one month  history of left sided posterior neck pain and swelling.   Recurrent Cellulitis and Abscess 2/2 [place] Stage III Severe Hidradenitis Suppurativa: Patient has a 16 year history of acute on chronic recurrent cellulitis with abscess secondary to his severe hidradenitis which was diagnosed 3 years ago. He has not followed closely with a general surgeon or plastic surgeon for this issues. His HS is complicated by morbid obesity, uncontrolled diabetes and previous tobacco use. Currently, his cellulitis and abscess seem to be improving with IV Vancomycin s/p I&D x 3. However, patient may need surgical intervention if abscess is persistent on repeat CT neck. There are no systemic signs of infection at this time. -Continue Vancomycin -Consider de-escalation to po antibiotics after results of CT scan -Repeat CT neck 10/17 -Consider surgical intervention if persistent abscess is present  Charlestine Massed Stage III Severe Hidradenitis Suppurativa:  After treatment of patient's current infection, he will need aggressive multidisciplinary treatment for HS to prevent recurrence.  -Consider referral to [facility] with Dr. Otho Ket if appropriate [phone]) versus close follow up with general surgery or plastic surgery -Discussed importance of weight loss and diabetic control, continue metformin -Congratulated patient on tobacco cessation -Consider chronic suppressive therapy with doxycycline 100 mg BID for several months -Consider topical clindamycin -Consider wide excision surgery if deemed appropriate  Martyn Malay, DO PGY-3 Internal Medicine Resident Pager # [phone] 03/03/2016 2:44 PM

## 2016-03-03 NOTE — Progress Notes (Signed)
PROGRESS NOTE    Albert White  ZOX:[id number] DOB: Feb 09, 1976 DOA: 02/29/2016 PCP: Alysia Penna, MD     Brief Narrative:  Albert White is a 40 y.o. male with past mental history significant for diabetes and neck abscess in the past on left. Patient states the symptoms he has of neck pain swelling have been going on for about a month. Symptoms can acutely worsened on Wednesday. Patient went to the emergency room for evaluation where the abscess was I&D. Patient was sent home on Keflex and Bactrim which he states he took as directed. Patient did not have much improvement and actually felt that is getting worse over return to the emergency room. CT was obtained in ED which showed deep neck abscess and had a repeat I&D in Ed. He was started on levaquin and vanco.   Assessment & Plan:   Principal Problem:   Cellulitis Active Problems:   Hidradenitis suppurativa, neck and scalp   DM (diabetes mellitus) ([facility])   HLD (hyperlipidemia)   Neck abscess   Posterior neck abscess with cellulitis -Dx of hidradenitis suppurativa in the past  -Status post I&D 2 (on Wednesday, then again Friday)  -Failed outpatient antibiotic treatment of Bactrim and Keflex -Currently on vancomycin  -Local wound care -Appreciate general surgery - repeat CT in next 1-2 days  -Consult ID   Diabetes type 2, non-insulin dependent, uncontrolled -Ha1c 8.6 -Hold metformin, will increase dose to max at time of discharge  -Add lantus 20 u qhs  -SSI for now, will need better outpatient management of DM  -Consult DM coordinator   Hyperlipidemia -Continue statin  Insomnia -Trazodone qhs   DVT prophylaxis: lovenox Code Status: full Family Communication: wife at bedside  Disposition Plan: home when improved    Consultants:   General surgery  ID   Procedures:   I&D in the ED  Antimicrobials:   Vanco 10/13 >>   Subjective: Patient denies any fevers, chest pain, cough, shortness of  breath, nausea, vomiting, diarrhea, abdominal pain, dysuria. Still having significant pain and irritation posterior left neck, no current drainage.    Objective: Vitals:   03/02/16 0940 03/02/16 1800 03/02/16 2013 03/03/16 0414  BP: 130/77 124/72 127/74 (!) 144/81  Pulse: 63 78 75 60  Resp: 18 18 19 18   Temp: 98.4 F (36.9 C) 98 F (36.7 C) 98.2 F (36.8 C) 97.6 F (36.4 C)  TempSrc: Oral Oral Oral Oral  SpO2: 96% 95% 98% 100%  Weight:      Height:        Intake/Output Summary (Last 24 hours) at 03/03/16 0945 Last data filed at 03/03/16 0415  Gross per 24 hour  Intake             1060 ml  Output             2400 ml  Net            -1340 ml   Filed Weights   02/29/16 0900 02/29/16 2039 03/01/16 2100  Weight: (!) 175 kg (385 lb 11.2 oz) (!) 176.1 kg (388 lb 4.8 oz) (!) 181.3 kg (399 lb 11.2 oz)    Examination:  General exam: Appears calm and comfortable  Respiratory system: Clear to auscultation. Respiratory effort normal. Cardiovascular system: S1 & S2 heard, RRR. No JVD, murmurs, rubs, gallops or clicks. No pedal edema. Gastrointestinal system: Abdomen is nondistended, soft and nontender. No organomegaly or masses felt. Normal bowel sounds heard. Central nervous system: Alert and oriented. No  focal neurological deficits. Extremities: Symmetric 5 x 5 power. Skin: left posterior neck and scalp with tenderness to palpation, erythema extending up to scalp, and anteriorly to mastoid process, no gross drainage, but had some dried pus on bandage  Psychiatry: Judgement and insight appear normal. Mood & affect appropriate.   Data Reviewed: I have personally reviewed following labs and imaging studies  CBC:  Recent Labs Lab 02/29/16 0400 02/29/16 1136 03/02/16 0617 03/03/16 0532  WBC 8.6 6.0 5.2 4.3  NEUTROABS 5.4  --  2.5 2.1  HGB 14.8 14.1 13.3 13.7  HCT 42.3 41.1 39.4 40.4  MCV 86.9 86.9 87.8 87.3  PLT 181 166 169 179   Basic Metabolic Panel:  Recent Labs Lab  02/29/16 0400 02/29/16 1136 03/01/16 0726 03/02/16 0617 03/03/16 0532  NA 133*  --  135 136 138  K 4.0  --  4.2 4.0 4.1  CL 101  --  103 103 102  CO2 24  --  25 28 29   GLUCOSE 259*  --  205* 159* 172*  BUN 11  --  9 7 6   CREATININE 0.77 0.93 0.69 0.74 0.73  CALCIUM 8.6*  --  8.2* 8.4* 8.6*   GFR: Estimated Creatinine Clearance: 205.2 mL/min (by C-G formula based on SCr of 0.73 mg/dL). Liver Function Tests: No results for input(s): AST, ALT, ALKPHOS, BILITOT, PROT, ALBUMIN in the last 168 hours. No results for input(s): LIPASE, AMYLASE in the last 168 hours. No results for input(s): AMMONIA in the last 168 hours. Coagulation Profile: No results for input(s): INR, PROTIME in the last 168 hours. Cardiac Enzymes: No results for input(s): CKTOTAL, CKMB, CKMBINDEX, TROPONINI in the last 168 hours. BNP (last 3 results) No results for input(s): PROBNP in the last 8760 hours. HbA1C:  Recent Labs  02/29/16 1136  HGBA1C 8.6*   CBG:  Recent Labs Lab 03/02/16 0748 03/02/16 1215 03/02/16 1700 03/02/16 2100 03/03/16 0745  GLUCAP 142* 159* 136* 272* 168*   Lipid Profile: No results for input(s): CHOL, HDL, LDLCALC, TRIG, CHOLHDL, LDLDIRECT in the last 72 hours. Thyroid Function Tests: No results for input(s): TSH, T4TOTAL, FREET4, T3FREE, THYROIDAB in the last 72 hours. Anemia Panel: No results for input(s): VITAMINB12, FOLATE, FERRITIN, TIBC, IRON, RETICCTPCT in the last 72 hours. Sepsis Labs: No results for input(s): PROCALCITON, LATICACIDVEN in the last 168 hours.  Recent Results (from the past 240 hour(s))  Wound or Superficial Culture     Status: None (Preliminary result)   Collection Time: 02/29/16  5:48 AM  Result Value Ref Range Status   Specimen Description NECK  Final   Special Requests NONE  Final   Gram Stain   Final    FEW WBC PRESENT, PREDOMINANTLY PMN FEW SQUAMOUS EPITHELIAL CELLS PRESENT RARE GRAM POSITIVE COCCI IN PAIRS RARE GRAM POSITIVE RODS     Culture   Final    CULTURE REINCUBATED FOR BETTER GROWTH [facility]    Report Status PENDING  Incomplete       Radiology Studies: No results found.    Scheduled Meds: . atorvastatin  10 mg Oral q1800  . enoxaparin (LOVENOX) injection  0.5 mg/kg Subcutaneous Q24H  . insulin aspart  0-20 Units Subcutaneous TID WC  . insulin glargine  20 Units Subcutaneous QHS  . traZODone  100 mg Oral QHS  . vancomycin  1,500 mg Intravenous Q8H   Continuous Infusions:    LOS: 3 days    Time spent: 25 minutes   Noralee StainJennifer Hensley Aziz,  DO [facility] [email or web address] Password [email or web address] 03/03/2016, 9:45 AM

## 2016-03-03 NOTE — Consult Note (Addendum)
WOC consult requested prior to surgical team involvement.  They are now following for assessment and plan of care.  Please refer to their team for further questions and  re-consult if further assistance is needed.  Thank-you,  Cammie Mcgeeawn Debria Broecker MSN, RN, CWOCN, [facility]acon[facility]CN-AP, CNS [phone]

## 2016-03-03 NOTE — Progress Notes (Signed)
Inpatient Diabetes Program Recommendations  AACE/ADA: New Consensus Statement on Inpatient Glycemic Control (2015)  Target Ranges:  Prepandial:   less than 140 mg/dL      Peak postprandial:   less than 180 mg/dL (1-2 hours)      Critically ill patients:  140 - 180 mg/dL   Lab Results  Component Value Date   GLUCAP 131 (H) 03/03/2016   HGBA1C 8.6 (H) 02/29/2016   Review of Glycemic Control  Inpatient Diabetes Program Recommendations:   Spoke with Albert White about A1C results and explained what an A1C is, basic pathophysiology of DM Type 2, basic home care, basic diabetes diet nutrition principles, importance of checking CBGs and maintaining good CBG control to prevent long-term and short-term complications. Reviewed signs and symptoms of hyperglycemia and hypoglycemia and how to treat hypoglycemia at home. Also reviewed blood sugar goals at home.  Patient willing to attend outpatient diabetes education if can coordinate with his job.  Thank you, Billy FischerJudy E. Charman Blasco, RN, MSN, CDE Inpatient Glycemic Control Team Team Pager [phone] (8am-5pm) 03/03/2016 3:17 PM

## 2016-03-04 ENCOUNTER — Inpatient Hospital Stay (HOSPITAL_COMMUNITY): Payer: 59

## 2016-03-04 ENCOUNTER — Encounter (HOSPITAL_COMMUNITY): Payer: Self-pay | Admitting: Radiology

## 2016-03-04 LAB — CBC WITH DIFFERENTIAL/PLATELET
Basophils Absolute: 0 10*3/uL (ref 0.0–0.1)
Basophils Relative: 0 %
Eosinophils Absolute: 0.3 10*3/uL (ref 0.0–0.7)
Eosinophils Relative: 5 %
HEMATOCRIT: 41.8 % (ref 39.0–52.0)
HEMOGLOBIN: 14.1 g/dL (ref 13.0–17.0)
LYMPHS ABS: 1.8 10*3/uL (ref 0.7–4.0)
LYMPHS PCT: 37 %
MCH: 29.4 pg (ref 26.0–34.0)
MCHC: 33.7 g/dL (ref 30.0–36.0)
MCV: 87.1 fL (ref 78.0–100.0)
MONOS PCT: 8 %
Monocytes Absolute: 0.4 10*3/uL (ref 0.1–1.0)
NEUTROS ABS: 2.4 10*3/uL (ref 1.7–7.7)
NEUTROS PCT: 50 %
Platelets: 181 10*3/uL (ref 150–400)
RBC: 4.8 MIL/uL (ref 4.22–5.81)
RDW: 12.6 % (ref 11.5–15.5)
WBC: 4.8 10*3/uL (ref 4.0–10.5)

## 2016-03-04 LAB — BASIC METABOLIC PANEL
Anion gap: 7 (ref 5–15)
BUN: 7 mg/dL (ref 6–20)
CHLORIDE: 103 mmol/L (ref 101–111)
CO2: 27 mmol/L (ref 22–32)
CREATININE: 0.67 mg/dL (ref 0.61–1.24)
Calcium: 8.7 mg/dL — ABNORMAL LOW (ref 8.9–10.3)
GFR calc Af Amer: >60 mL/min (ref >60)
GFR calc non Af Amer: >60 mL/min (ref >60)
Glucose, Bld: 143 mg/dL — ABNORMAL HIGH (ref 65–99)
POTASSIUM: 4.3 mmol/L (ref 3.5–5.1)
Sodium: 137 mmol/L (ref 135–145)

## 2016-03-04 LAB — GLUCOSE, CAPILLARY
GLUCOSE-CAPILLARY: 163 mg/dL — AB (ref 65–99)
GLUCOSE-CAPILLARY: 211 mg/dL — AB (ref 65–99)
GLUCOSE-CAPILLARY: 174 mg/dL — AB (ref 65–99)
Glucose-Capillary: 146 mg/dL — ABNORMAL HIGH (ref 65–99)

## 2016-03-04 MED ORDER — IOPAMIDOL (ISOVUE-300) INJECTION 61%
INTRAVENOUS | Status: AC
  Administered 2016-03-04: 100 mL
  Filled 2016-03-04: qty 100

## 2016-03-04 MED ORDER — INSULIN ASPART 100 UNIT/ML ~~LOC~~ SOLN
0.0000 [IU] | Freq: Three times a day (TID) | SUBCUTANEOUS | Status: DC
  Administered 2016-03-04: 4 [IU] via SUBCUTANEOUS
  Administered 2016-03-04: 3 [IU] via SUBCUTANEOUS
  Administered 2016-03-04: 7 [IU] via SUBCUTANEOUS
  Administered 2016-03-05: 4 [IU] via SUBCUTANEOUS

## 2016-03-04 MED ORDER — INSULIN ASPART 100 UNIT/ML ~~LOC~~ SOLN: 0.0000 [IU] | Freq: Every day | SUBCUTANEOUS | Status: DC

## 2016-03-04 NOTE — Progress Notes (Signed)
PROGRESS NOTE    Albert PetrinSteven T Kemppainen  UJW:[id number]:[id number] DOB: November 08, 1975 DOA: 02/29/2016 PCP: Alysia PennaHOLWERDA, SCOTT, MD     Brief Narrative:  Albert White is a 40 y.o. male with past mental history significant for diabetes and neck abscess in the past on left. Patient states the symptoms he has of neck pain swelling have been going on for about a month. Symptoms can acutely worsened on Wednesday. Patient went to the emergency room for evaluation where the abscess was I&D. Patient was sent home on Keflex and Bactrim which he states he took as directed. Patient did not have much improvement and actually felt that is getting worse over return to the emergency room. CT was obtained in ED which showed deep neck abscess and had a repeat I&D in Ed. He was started on levaquin and vanco, then vanco only. General surgery as well as infectious disease were consulted.   Assessment & Plan:   Principal Problem:   Cellulitis Active Problems:   Hidradenitis suppurativa, neck and scalp   DM (diabetes mellitus) (HCC)   HLD (hyperlipidemia)   Neck abscess   Posterior neck abscess with cellulitis -Dx of hidradenitis suppurativa in the past  -Status post I&D 2 (on Wednesday, then again Friday)  -Failed outpatient antibiotic treatment of Bactrim and Keflex -Currently on vancomycin  -Local wound care -General surgery and infectious disease following -Repeat CT with improvement in abscess size. Awaiting decision for additional I&D vs. transition to oral antibiotic   Diabetes type 2, non-insulin dependent, uncontrolled -Ha1c 8.6 -Hold metformin, will increase dose to max at time of discharge  -Add lantus 20 u qhs  -SSI for now, will need better outpatient management of DM  -Appreciate DM coordinator   Hyperlipidemia -Continue statin  Insomnia -Trazodone qhs   DVT prophylaxis: lovenox Code Status: full Family Communication: no family at bedside  Disposition Plan: home when improved     Consultants:   General surgery  ID   Procedures:   I&D in the ED  Antimicrobials:   Vanco 10/13 >>   Subjective: Patient denies any fevers, chest pain, cough, shortness of breath, nausea, vomiting, diarrhea, abdominal pain, dysuria. Still having significant pain and irritation posterior left neck, no current drainage. No change from yesterday.    Objective: Vitals:   03/03/16 1755 03/03/16 2137 03/04/16 0636 03/04/16 0900  BP: (!) 156/85 131/79 122/77 135/79  Pulse: 63 64 98 95  Resp: 18 20 (!) 21 18  Temp: 98.7 F (37.1 C) 98 F (36.7 C) 98 F (36.7 C) 98.6 F (37 C)  TempSrc: Oral   Oral  SpO2: 100% 98% 98% 98%  Weight:      Height:        Intake/Output Summary (Last 24 hours) at 03/04/16 1453 Last data filed at 03/04/16 1000  Gross per 24 hour  Intake             1600 ml  Output             1375 ml  Net              225 ml   Filed Weights   02/29/16 0900 02/29/16 2039 03/01/16 2100  Weight: (!) 175 kg (385 lb 11.2 oz) (!) 176.1 kg (388 lb 4.8 oz) (!) 181.3 kg (399 lb 11.2 oz)    Examination:  General exam: Appears calm and comfortable  Respiratory system: Clear to auscultation. Respiratory effort normal. Cardiovascular system: S1 & S2 heard, RRR. No JVD, murmurs, rubs,  gallops or clicks. No pedal edema. Gastrointestinal system: Abdomen is nondistended, soft and nontender. No organomegaly or masses felt. Normal bowel sounds heard. Central nervous system: Alert and oriented. No focal neurological deficits. Extremities: Symmetric 5 x 5 power. Skin: left posterior neck and scalp with tenderness to palpation, erythema extending up to scalp, and anteriorly to mastoid process, no gross drainage today  Psychiatry: Judgement and insight appear normal. Mood & affect appropriate.   Data Reviewed: I have personally reviewed following labs and imaging studies  CBC:  Recent Labs Lab 02/29/16 0400 02/29/16 1136 03/02/16 0617 03/03/16 0532 03/04/16 0355   WBC 8.6 6.0 5.2 4.3 4.8  NEUTROABS 5.4  --  2.5 2.1 2.4  HGB 14.8 14.1 13.3 13.7 14.1  HCT 42.3 41.1 39.4 40.4 41.8  MCV 86.9 86.9 87.8 87.3 87.1  PLT 181 166 169 179 181   Basic Metabolic Panel:  Recent Labs Lab 02/29/16 0400 02/29/16 1136 03/01/16 0726 03/02/16 0617 03/03/16 0532 03/04/16 0355  NA 133*  --  135 136 138 137  K 4.0  --  4.2 4.0 4.1 4.3  CL 101  --  103 103 102 103  CO2 24  --  25 28 29 27   GLUCOSE 259*  --  205* 159* 172* 143*  BUN 11  --  9 7 6 7   CREATININE 0.77 0.93 0.69 0.74 0.73 0.67  CALCIUM 8.6*  --  8.2* 8.4* 8.6* 8.7*   GFR: Estimated Creatinine Clearance: 205.2 mL/min (by C-G formula based on SCr of 0.67 mg/dL). Liver Function Tests: No results for input(s): AST, ALT, ALKPHOS, BILITOT, PROT, ALBUMIN in the last 168 hours. No results for input(s): LIPASE, AMYLASE in the last 168 hours. No results for input(s): AMMONIA in the last 168 hours. Coagulation Profile: No results for input(s): INR, PROTIME in the last 168 hours. Cardiac Enzymes: No results for input(s): CKTOTAL, CKMB, CKMBINDEX, TROPONINI in the last 168 hours. BNP (last 3 results) No results for input(s): PROBNP in the last 8760 hours. HbA1C: No results for input(s): HGBA1C in the last 72 hours. CBG:  Recent Labs Lab 03/03/16 1231 03/03/16 1644 03/03/16 2136 03/04/16 0717 03/04/16 1155  GLUCAP 131* 186* 198* 163* 146*   Lipid Profile: No results for input(s): CHOL, HDL, LDLCALC, TRIG, CHOLHDL, LDLDIRECT in the last 72 hours. Thyroid Function Tests: No results for input(s): TSH, T4TOTAL, FREET4, T3FREE, THYROIDAB in the last 72 hours. Anemia Panel: No results for input(s): VITAMINB12, FOLATE, FERRITIN, TIBC, IRON, RETICCTPCT in the last 72 hours. Sepsis Labs: No results for input(s): PROCALCITON, LATICACIDVEN in the last 168 hours.  Recent Results (from the past 240 hour(s))  Wound or Superficial Culture     Status: None   Collection Time: 02/29/16  5:48 AM  Result  Value Ref Range Status   Specimen Description NECK  Final   Special Requests NONE  Final   Gram Stain   Final    FEW WBC PRESENT, PREDOMINANTLY PMN FEW SQUAMOUS EPITHELIAL CELLS PRESENT RARE GRAM POSITIVE COCCI IN PAIRS RARE GRAM POSITIVE RODS    Culture   Final    NORMAL SKIN FLORA Performed at [facility]    Report Status 03/03/2016 FINAL  Final       Radiology Studies: Ct Soft Tissue Neck W Contrast  Result Date: 03/04/2016 CLINICAL DATA:  Followup neck abscess. EXAM: CT NECK WITH CONTRAST TECHNIQUE: Multidetector CT imaging of the neck was performed using the standard protocol following the bolus administration of intravenous contrast. CONTRAST:  ISOVUE-300 IOPAMIDOL (ISOVUE-300) INJECTION 61% COMPARISON:  CT neck 02/29/2016 FINDINGS: Pharynx and larynx: The nasopharynx and oropharynx are normal. Bilateral tonsillar hypertrophy. Airway intact. Normal vocal cords bilaterally. Proximal esophagus normal. Salivary glands: No inflammation, mass, or stone. Thyroid: Negative Lymph nodes: Scattered posterior lymph nodes on the left are stable and reactive measuring under 1 cm each. Posterior subcutaneous lymph nodes bilaterally also stable and consistent with infection. These measure under 1 cm. Vascular: Carotid artery and jugular vein patent bilaterally. Limited intracranial: Negative Visualized orbits: Negative Mastoids and visualized paranasal sinuses: Negative Skeleton: Negative Upper chest: Lung apices clear. Other: Interval improvement in inflammation in the left posterior neck subcutaneous tissues. Diffuse skin thickening has improved. Subcutaneous stranding has improved. Ill-defined fluid collection in the subcutaneous tissues on the left has improved now measuring approximately 1 cm. This is compatible with resolving abscess and adjacent cellulitis. IMPRESSION: Improving infection in the left posterior neck subcutaneous tissues. Skin thickening has improved. Subcutaneous  edema has improved. Abscess has improved now measuring 1 cm. Reactive adenopathy in the neck bilaterally is similar. Electronically Signed   By: Marlan Palau M.D.   On: 03/04/2016 10:06      Scheduled Meds: . atorvastatin  10 mg Oral q1800  . enoxaparin (LOVENOX) injection  0.5 mg/kg Subcutaneous Q24H  . insulin aspart  0-20 Units Subcutaneous TID WC  . insulin aspart  0-5 Units Subcutaneous QHS  . insulin glargine  20 Units Subcutaneous QHS  . traZODone  100 mg Oral QHS  . vancomycin  1,500 mg Intravenous Q8H   Continuous Infusions:    LOS: 4 days    Time spent: 25 minutes   Noralee Stain, DO [facility] [email or web address] Password [email or web address] 03/04/2016, 2:53 PM

## 2016-03-04 NOTE — Progress Notes (Addendum)
Patient ID: Albert White, male   DOB: 13-Nov-1975, 40 y.o.   MRN: [id number]  [facility] Surgery Progress Note     Subjective: Repeat CT scan today showed improving infection in the left posterior neck subcutaneous tissues and improvement in abscess now measuring 1 cm (previously 38mm). Patient concerned that antibiotics will not be enough to heal this. He continues to notice intermittent drainage that has foul odor. Continues to have significant pain.  Objective: Vital signs in last 24 hours: Temp:  [98 F (36.7 C)-98.7 F (37.1 C)] 98.6 F (37 C) (10/17 0900) Pulse Rate:  [63-98] 95 (10/17 0900) Resp:  [18-21] 18 (10/17 0900) BP: (122-156)/(77-85) 135/79 (10/17 0900) SpO2:  [98 %-100 %] 98 % (10/17 0900) Last BM Date: 03/03/16  Intake/Output from previous day: 10/16 0701 - 10/17 0700 In: 1960 [P.O.:960; IV Piggyback:1000] Out: 2975 [Urine:2975] Intake/Output this shift: Total I/O In: 240 [P.O.:240] Out: 0   PE: Gen:  Alert, NAD, pleasant Pulm:  Effort normal Neck: Posterior and left neck with erythema and cellulitis, scant clear drainage, very tender.  Lab Results:   Recent Labs  03/03/16 0532 03/04/16 0355  WBC 4.3 4.8  HGB 13.7 14.1  HCT 40.4 41.8  PLT 179 181   BMET  Recent Labs  03/03/16 0532 03/04/16 0355  NA 138 137  K 4.1 4.3  CL 102 103  CO2 29 27  GLUCOSE 172* 143*  BUN 6 7  CREATININE 0.73 0.67  CALCIUM 8.6* 8.7*   PT/INR No results for input(s): LABPROT, INR in the last 72 hours. CMP     Component Value Date/Time   NA 137 03/04/2016 0355   K 4.3 03/04/2016 0355   CL 103 03/04/2016 0355   CO2 27 03/04/2016 0355   GLUCOSE 143 (H) 03/04/2016 0355   BUN 7 03/04/2016 0355   CREATININE 0.67 03/04/2016 0355   CALCIUM 8.7 (L) 03/04/2016 0355   PROT 5.9 (L) 12/14/2013 0805   ALBUMIN 2.9 (L) 12/14/2013 0805   AST 19 12/14/2013 0805   ALT 21 12/14/2013 0805   ALKPHOS 66 12/14/2013 0805   BILITOT 0.4 12/14/2013 0805   GFRNONAA  >60 03/04/2016 0355   GFRAA >60 03/04/2016 0355   Lipase  No results found for: LIPASE     Studies/Results: Ct Soft Tissue Neck W Contrast  Result Date: 03/04/2016 CLINICAL DATA:  Followup neck abscess. EXAM: CT NECK WITH CONTRAST TECHNIQUE: Multidetector CT imaging of the neck was performed using the standard protocol following the bolus administration of intravenous contrast. CONTRAST:  ISOVUE-300 IOPAMIDOL (ISOVUE-300) INJECTION 61% COMPARISON:  CT neck 02/29/2016 FINDINGS: Pharynx and larynx: The nasopharynx and oropharynx are normal. Bilateral tonsillar hypertrophy. Airway intact. Normal vocal cords bilaterally. Proximal esophagus normal. Salivary glands: No inflammation, mass, or stone. Thyroid: Negative Lymph nodes: Scattered posterior lymph nodes on the left are stable and reactive measuring under 1 cm each. Posterior subcutaneous lymph nodes bilaterally also stable and consistent with infection. These measure under 1 cm. Vascular: Carotid artery and jugular vein patent bilaterally. Limited intracranial: Negative Visualized orbits: Negative Mastoids and visualized paranasal sinuses: Negative Skeleton: Negative Upper chest: Lung apices clear. Other: Interval improvement in inflammation in the left posterior neck subcutaneous tissues. Diffuse skin thickening has improved. Subcutaneous stranding has improved. Ill-defined fluid collection in the subcutaneous tissues on the left has improved now measuring approximately 1 cm. This is compatible with resolving abscess and adjacent cellulitis. IMPRESSION: Improving infection in the left posterior neck subcutaneous tissues. Skin thickening has improved.  Subcutaneous edema has improved. Abscess has improved now measuring 1 cm. Reactive adenopathy in the neck bilaterally is similar. Electronically Signed   By: Marlan Palauharles  Clark M.D.   On: 03/04/2016 10:06    Anti-infectives: Anti-infectives    Start     Dose/Rate Route Frequency Ordered Stop    02/29/16 2200  vancomycin (VANCOCIN) 1,500 mg in sodium chloride 0.9 % 500 mL IVPB     1,500 mg 250 mL/hr over 120 Minutes Intravenous Every 8 hours 02/29/16 1201     02/29/16 1215  vancomycin (VANCOCIN) 1,500 mg in sodium chloride 0.9 % 500 mL IVPB  Status:  Discontinued     1,500 mg 250 mL/hr over 120 Minutes Intravenous  Once 02/29/16 1152 02/29/16 1153   02/29/16 1215  vancomycin (VANCOCIN) 1,500 mg in sodium chloride 0.9 % 500 mL IVPB     1,500 mg 250 mL/hr over 120 Minutes Intravenous  Once 02/29/16 1153 02/29/16 1450   02/29/16 1000  vancomycin (VANCOCIN) IVPB 1000 mg/200 mL premix     1,000 mg 200 mL/hr over 60 Minutes Intravenous  Once 02/29/16 0957 02/29/16 1214   02/29/16 0600  levofloxacin (LEVAQUIN) IVPB 750 mg     750 mg 100 mL/hr over 90 Minutes Intravenous  Once 02/29/16 0551 02/29/16 0758       Assessment/Plan Cervical hidradenitis w/abscess - CT today showed improving infection in the left posterior neck subcutaneous tissues and improvement in abscess now measuring 1 cm (previously 38mm). - WBC 4.8, afebrile  Plan - although abscess is small he does still have an abscess. Will discuss possibility of I&D with MD.   LOS: 4 days    Edson SnowballBROOKE A MILLER , [facility] 03/04/2016, 11:55 AM Pager: [phone] Consults: [phone] Mon-Fri 7:00 am-4:30 pm Sat-Sun 7:00 am-11:30 am  Agree with above. By CT things are better. No plan for surgery right now.  Warm soaks over the area would help. Can switch to po antibiotics with plans to discharge Wednesday or Thursday.  Ovidio Kinavid Philopater Mucha, MD, [facility] Pager: [phone] Office phone:  [phone]

## 2016-03-04 NOTE — Progress Notes (Signed)
[facility]    Date of Admission:  02/29/2016   Total days of antibiotics 5      ID: Albert White is a 40 y.o. male with PMHx of cervical hidradenitis suppurativa, morbid obesity, uncontrolled T2DM, HLD and chronic recurrent cellulitis who presented to the ED on 02/27/16 with complaint of a one month history of left sided posterior neck pain and swelling and found to have recurrent left posterior neck abscess with cellulitis.    Principal Problem:   Cellulitis Active Problems:   Hidradenitis suppurativa, neck and scalp   DM (diabetes mellitus) (HCC)   HLD (hyperlipidemia)   Neck abscess  Subjective: Patient was seen and examined this morning. His pain continues to improve slightly each day. He denies fever or chills, but continues to note clear drainage from the abscess site. Wife and patient are concerned about him working with continued pain.   Medications:  . atorvastatin  10 mg Oral q1800  . enoxaparin (LOVENOX) injection  0.5 mg/kg Subcutaneous Q24H  . insulin aspart  0-20 Units Subcutaneous TID WC  . insulin aspart  0-5 Units Subcutaneous QHS  . insulin glargine  20 Units Subcutaneous QHS  . traZODone  100 mg Oral QHS  . vancomycin  1,500 mg Intravenous Q8H    Objective: Vitals:   03/03/16 1755 03/03/16 2137 03/04/16 0636 03/04/16 0900  BP: (!) 156/85 131/79 122/77 135/79  Pulse: 63 64 98 95  Resp: 18 20 (!) 21 18  Temp: 98.7 F (37.1 C) 98 F (36.7 C) 98 F (36.7 C) 98.6 F (37 C)  TempSrc: Oral   Oral  SpO2: 100% 98% 98% 98%  Weight:      Height:       General: Vital signs reviewed.  Patient is obese, in no acute distress and cooperative with exam.  Cardiovascular: RRR Pulmonary/Chest: Clear to auscultation bilaterally, no wheezes, rales, or rhonchi. Abdominal: Soft, non-tender, non-distended, BS + Skin: Left posterior neck shows slight improvement with edema and erythema. Continued palpation of deep seated nodules, sinus  tracts and rope-like scars without clear drainage.  Psychiatric: Normal mood and affect. speech and behavior is normal. Cognition and memory are normal.   Lab Results  Recent Labs  03/03/16 0532 03/04/16 0355  WBC 4.3 4.8  HGB 13.7 14.1  HCT 40.4 41.8  NA 138 137  K 4.1 4.3  CL 102 103  CO2 29 27  BUN 6 7  CREATININE 0.73 0.67   Microbiology: Wound Cx: rare gram positive cocci in pairs and rare gram positive rods  Current antibiotics: Keflex: 9/12 >> 9/21 Bactrim: 9/12>> 9/21 Keflex: 10/11 >>10/12 Bactrim: 10/11 >> 10/12 Levaquin 10/13 >>10/13 Vancomycin 10/13 >>  Studies/Results: Ct Soft Tissue Neck W Contrast  Result Date: 03/04/2016 CLINICAL DATA:  Followup neck abscess. EXAM: CT NECK WITH CONTRAST TECHNIQUE: Multidetector CT imaging of the neck was performed using the standard protocol following the bolus administration of intravenous contrast. CONTRAST:  100mL ISOVUE-300 IOPAMIDOL (ISOVUE-300) INJECTION 61% COMPARISON:  CT neck 02/29/2016 FINDINGS: Pharynx and larynx: The nasopharynx and oropharynx are normal. Bilateral tonsillar hypertrophy. Airway intact. Normal vocal cords bilaterally. Proximal esophagus normal. Salivary glands: No inflammation, mass, or stone. Thyroid: Negative Lymph nodes: Scattered posterior lymph nodes on the left are stable and reactive measuring under 1 cm each. Posterior subcutaneous lymph nodes bilaterally also stable and consistent with infection. These measure under 1 cm. Vascular: Carotid artery and jugular vein patent bilaterally. Limited intracranial: Negative Visualized orbits:  Negative Mastoids and visualized paranasal sinuses: Negative Skeleton: Negative Upper chest: Lung apices clear. Other: Interval improvement in inflammation in the left posterior neck subcutaneous tissues. Diffuse skin thickening has improved. Subcutaneous stranding has improved. Ill-defined fluid collection in the subcutaneous tissues on the left has improved now  measuring approximately 1 cm. This is compatible with resolving abscess and adjacent cellulitis. IMPRESSION: Improving infection in the left posterior neck subcutaneous tissues. Skin thickening has improved. Subcutaneous edema has improved. Abscess has improved now measuring 1 cm. Reactive adenopathy in the neck bilaterally is similar. Electronically Signed   By: Marlan Palau M.D.   On: 03/04/2016 10:06   Assessment/Plan: Albert White is a 40 yo male with PMHx of hidradenitis suppurativa, morbid obesity, uncontrolled T2DM, HLD and chronic recurrent cellulitis who presented with recurrent cellulitis and abscess.   Recurrent Cellulitis and Abscess 2/2 Hurley Stage III Severe Hidradenitis Suppurativa: Afebrile, no leukocytosis. CT neck shows improvement in soft tissue edema and thickening and improvement of abscess down to 1 cm in size. Given that a small abscess remains, surgery is considering a possible repeat I&D.  -Continue Vancomycin -Will transition to doxycycline and keflex once patient is to be discharged home -Surgery considering repeat I&D  Hurley Stage III Severe Hidradenitis Suppurativa:  After treatment of patient's current infection, he will need aggressive multidisciplinary treatment for HS to prevent recurrence.  -Will start referral to [facility] with Dr. Janyth Contes [phone]); will need help from PCP as well to establish patient -Discussed importance of weight loss and diabetic control, continue metformin -Avoid tobacco use -May benefit from topical clindamycin and future use of chronic antibiotic suppression  Karlene Lineman, DO PGY-3 Internal Medicine Resident Pager # [phone] 03/04/2016 2:00 PM

## 2016-03-05 DIAGNOSIS — L03221 Cellulitis of neck: Secondary | ICD-10-CM

## 2016-03-05 DIAGNOSIS — L0211 Cutaneous abscess of neck: Principal | ICD-10-CM

## 2016-03-05 DIAGNOSIS — E1165 Type 2 diabetes mellitus with hyperglycemia: Secondary | ICD-10-CM

## 2016-03-05 DIAGNOSIS — Z79899 Other long term (current) drug therapy: Secondary | ICD-10-CM

## 2016-03-05 DIAGNOSIS — Z794 Long term (current) use of insulin: Secondary | ICD-10-CM

## 2016-03-05 DIAGNOSIS — L732 Hidradenitis suppurativa: Secondary | ICD-10-CM

## 2016-03-05 DIAGNOSIS — B9689 Other specified bacterial agents as the cause of diseases classified elsewhere: Secondary | ICD-10-CM

## 2016-03-05 LAB — CBC WITH DIFFERENTIAL/PLATELET
BASOS ABS: 0 10*3/uL (ref 0.0–0.1)
BASOS PCT: 1 %
Eosinophils Absolute: 0.2 10*3/uL (ref 0.0–0.7)
Eosinophils Relative: 5 %
HEMATOCRIT: 42.3 % (ref 39.0–52.0)
HEMOGLOBIN: 14.6 g/dL (ref 13.0–17.0)
Lymphocytes Relative: 34 %
Lymphs Abs: 1.6 10*3/uL (ref 0.7–4.0)
MCH: 29.4 pg (ref 26.0–34.0)
MCHC: 34.5 g/dL (ref 30.0–36.0)
MCV: 85.3 fL (ref 78.0–100.0)
MONO ABS: 0.4 10*3/uL (ref 0.1–1.0)
Monocytes Relative: 8 %
NEUTROS ABS: 2.5 10*3/uL (ref 1.7–7.7)
NEUTROS PCT: 52 %
Platelets: 161 10*3/uL (ref 150–400)
RBC: 4.96 MIL/uL (ref 4.22–5.81)
RDW: 12.4 % (ref 11.5–15.5)
WBC: 4.8 10*3/uL (ref 4.0–10.5)

## 2016-03-05 LAB — BASIC METABOLIC PANEL
ANION GAP: 10 (ref 5–15)
BUN: 8 mg/dL (ref 6–20)
CALCIUM: 8.7 mg/dL — AB (ref 8.9–10.3)
CO2: 20 mmol/L — AB (ref 22–32)
Chloride: 106 mmol/L (ref 101–111)
Creatinine, Ser: 0.66 mg/dL (ref 0.61–1.24)
GFR calc non Af Amer: >60 mL/min (ref >60)
Glucose, Bld: 153 mg/dL — ABNORMAL HIGH (ref 65–99)
Potassium: 4.1 mmol/L (ref 3.5–5.1)
Sodium: 136 mmol/L (ref 135–145)

## 2016-03-05 LAB — GLUCOSE, CAPILLARY
GLUCOSE-CAPILLARY: 173 mg/dL — AB (ref 65–99)
Glucose-Capillary: 153 mg/dL — ABNORMAL HIGH (ref 65–99)

## 2016-03-05 MED ORDER — CEPHALEXIN 500 MG PO CAPS
500.0000 mg | ORAL_CAPSULE | Freq: Three times a day (TID) | ORAL | 0 refills | Status: DC
Start: 2016-03-05 — End: 2016-03-05

## 2016-03-05 MED ORDER — DOXYCYCLINE HYCLATE 100 MG PO TABS: 100.0000 mg | ORAL_TABLET | Freq: Two times a day (BID) | ORAL | 0 refills | Status: DC

## 2016-03-05 MED ORDER — OXYCODONE HCL 5 MG PO TABS: 5.0000 mg | ORAL_TABLET | Freq: Four times a day (QID) | ORAL | 0 refills | Status: DC | PRN

## 2016-03-05 MED ORDER — CEPHALEXIN 500 MG PO CAPS: 500.0000 mg | ORAL_CAPSULE | Freq: Three times a day (TID) | ORAL | 0 refills | Status: DC

## 2016-03-05 MED ORDER — METFORMIN HCL 500 MG PO TABS: 1000.0000 mg | ORAL_TABLET | Freq: Two times a day (BID) | ORAL | 0 refills | Status: DC

## 2016-03-05 NOTE — Progress Notes (Signed)
[facility]    Date of Admission:  02/29/2016   Total days of antibiotics 6      ID: Charlyne PetrinSteven T Grieger is a 40 y.o. male with PMHx of cervical hidradenitis suppurativa, morbid obesity, uncontrolled T2DM, HLD and chronic recurrent cellulitis who presented to the ED on 02/27/16 with complaint of a one month history of left sided posterior neck pain and swelling and found to have recurrent left posterior neck abscess with cellulitis.    Principal Problem:   Cellulitis Active Problems:   Hidradenitis suppurativa, neck and scalp   DM (diabetes mellitus) (HCC)   HLD (hyperlipidemia)   Cellulitis and abscess of neck  Subjective: Patient was seen and examined this morning. He continues to have pain and drainage at the neck site. He understands that he will not be receiving surgery and is okay with this. [facility]  Medications:  . atorvastatin  10 mg Oral q1800  . enoxaparin (LOVENOX) injection  0.5 mg/kg Subcutaneous Q24H  . insulin aspart  0-20 Units Subcutaneous TID WC  . insulin aspart  0-5 Units Subcutaneous QHS  . insulin glargine  20 Units Subcutaneous QHS  . traZODone  100 mg Oral QHS  . vancomycin  1,500 mg Intravenous Q8H    Objective: Vitals:   03/04/16 1702 03/04/16 2126 03/05/16 0612 03/05/16 0900  BP: 133/67 137/60 113/63 128/74  Pulse: 61 67 71 75  Resp: 18 16 16 16   Temp: 98.3 F (36.8 C) 98.8 F (37.1 C) 97.5 F (36.4 C) 98.3 F (36.8 C)  TempSrc: Oral Oral Oral Oral  SpO2: 100% 96% 97% 98%  Weight:      Height:       General: Vital signs reviewed.  Patient is obese, in no acute distress and cooperative with exam.  Cardiovascular: RRR Pulmonary/Chest: Clear to auscultation bilaterally, no wheezes, rales, or rhonchi. Skin: Left posterior neck with improved edema and localized erythema. Continued palpation of deep seated nodules, sinus tracts and rope-like scars without clear drainage.    Psychiatric: Normal mood and affect. speech and behavior is normal. Cognition and memory are normal.   Lab Results  Recent Labs  03/04/16 0355 03/05/16 0702  WBC 4.8 4.8  HGB 14.1 14.6  HCT 41.8 42.3  NA 137 136  K 4.3 4.1  CL 103 106  CO2 27 20*  BUN 7 8  CREATININE 0.67 0.66   Microbiology: Wound Cx: rare gram positive cocci in pairs and rare gram positive rods  Current antibiotics: Keflex: 9/12 >> 9/21 Bactrim: 9/12>> 9/21 Keflex: 10/11 >>10/12 Bactrim: 10/11 >> 10/12 Levaquin 10/13 >>10/13 Vancomycin 10/13 >>  Studies/Results: Ct Soft Tissue Neck W Contrast  Result Date: 03/04/2016 CLINICAL DATA:  Followup neck abscess. EXAM: CT NECK WITH CONTRAST TECHNIQUE: Multidetector CT imaging of the neck was performed using the standard protocol following the bolus administration of intravenous contrast. CONTRAST:  100mL ISOVUE-300 IOPAMIDOL (ISOVUE-300) INJECTION 61% COMPARISON:  CT neck 02/29/2016 FINDINGS: Pharynx and larynx: The nasopharynx and oropharynx are normal. Bilateral tonsillar hypertrophy. Airway intact. Normal vocal cords bilaterally. Proximal esophagus normal. Salivary glands: No inflammation, mass, or stone. Thyroid: Negative Lymph nodes: Scattered posterior lymph nodes on the left are stable and reactive measuring under 1 cm each. Posterior subcutaneous lymph nodes bilaterally also stable and consistent with infection. These measure under 1 cm. Vascular: Carotid artery and jugular vein patent bilaterally. Limited intracranial: Negative Visualized orbits: Negative Mastoids and visualized  paranasal sinuses: Negative Skeleton: Negative Upper chest: Lung apices clear. Other: Interval improvement in inflammation in the left posterior neck subcutaneous tissues. Diffuse skin thickening has improved. Subcutaneous stranding has improved. Ill-defined fluid collection in the subcutaneous tissues on the left has improved now measuring approximately 1 cm. This is compatible with  resolving abscess and adjacent cellulitis. IMPRESSION: Improving infection in the left posterior neck subcutaneous tissues. Skin thickening has improved. Subcutaneous edema has improved. Abscess has improved now measuring 1 cm. Reactive adenopathy in the neck bilaterally is similar. Electronically Signed   By: Marlan Palau M.D.   On: 03/04/2016 10:06   Assessment/Plan: Mr. Schweiss is a 40 yo male with PMHx of hidradenitis suppurativa, morbid obesity, uncontrolled T2DM, HLD and chronic recurrent cellulitis who presented with recurrent cellulitis and abscess.   Recurrent Cellulitis and Abscess 2/2 Hurley Stage III Severe Hidradenitis Suppurativa: Patient continues to improve on antibiotics. Surgery has no plans for surgery given improvement of abscess.   -Continue Vancomycin and transition to doxycycline 100 mg BID and keflex 250 mg Q6H for a total of 14 days of antibiotics (end date: 10/26) once patient is to be discharged home -Patient will need to follow up with Dr. Derrell Lolling with Surgery in 1-2 weeks as the HS clinic referrals typically take several months  Hurley Stage III Severe Hidradenitis Suppurativa:  After treatment of patient's current infection, he will need aggressive multidisciplinary treatment for HS to prevent recurrence.  -Will start referral to [facility] with Dr. Janyth Contes [phone]); will need help from PCP as well to establish patient -Discussed importance of weight loss and diabetic control, continue metformin -Avoid tobacco use -May benefit from topical clindamycin and future use of chronic antibiotic suppression  Karlene Lineman, DO PGY-3 Internal Medicine Resident Pager # [phone] 03/05/2016 11:22 AM

## 2016-03-05 NOTE — Progress Notes (Signed)
Patient ID: Albert White, male   DOB: 04-05-1976, 40 y.o.   MRN: [id number]  [facility] Surgery Progress Note     Subjective: Per patient he is likely going home today. Neck is feeling better. Hopes to follow-up with [facility].  Objective: Vital signs in last 24 hours: Temp:  [97.5 F (36.4 C)-98.8 F (37.1 C)] 98.3 F (36.8 C) (10/18 0900) Pulse Rate:  [61-75] 75 (10/18 0900) Resp:  [16-18] 16 (10/18 0900) BP: (113-137)/(60-74) 128/74 (10/18 0900) SpO2:  [96 %-100 %] 98 % (10/18 0900) Last BM Date: 03/03/16  Intake/Output from previous day: 10/17 0701 - 10/18 0700 In: 1080 [P.O.:1080] Out: 2775 [Urine:2775] Intake/Output this shift: Total I/O In: 240 [P.O.:240] Out: 600 [Urine:600]  PE: Gen:  Alert, NAD, pleasant Pulm:  Effort normal Neck: Posterior and left neck with mild erythema and cellulitis, scant clear drainage, less tender than yesterday.  Lab Results:   Recent Labs  03/04/16 0355 03/05/16 0702  WBC 4.8 4.8  HGB 14.1 14.6  HCT 41.8 42.3  PLT 181 161   BMET  Recent Labs  03/04/16 0355 03/05/16 0702  NA 137 136  K 4.3 4.1  CL 103 106  CO2 27 20*  GLUCOSE 143* 153*  BUN 7 8  CREATININE 0.67 0.66  CALCIUM 8.7* 8.7*   PT/INR No results for input(s): LABPROT, INR in the last 72 hours. CMP     Component Value Date/Time   NA 136 03/05/2016 0702   K 4.1 03/05/2016 0702   CL 106 03/05/2016 0702   CO2 20 (L) 03/05/2016 0702   GLUCOSE 153 (H) 03/05/2016 0702   BUN 8 03/05/2016 0702   CREATININE 0.66 03/05/2016 0702   CALCIUM 8.7 (L) 03/05/2016 0702   PROT 5.9 (L) 12/14/2013 0805   ALBUMIN 2.9 (L) 12/14/2013 0805   AST 19 12/14/2013 0805   ALT 21 12/14/2013 0805   ALKPHOS 66 12/14/2013 0805   BILITOT 0.4 12/14/2013 0805   GFRNONAA >60 03/05/2016 0702   GFRAA >60 03/05/2016 0702   Lipase  No results found for: LIPASE     Studies/Results: Ct Soft Tissue Neck W Contrast  Result Date: 03/04/2016 CLINICAL DATA:  Followup neck  abscess. EXAM: CT NECK WITH CONTRAST TECHNIQUE: Multidetector CT imaging of the neck was performed using the standard protocol following the bolus administration of intravenous contrast. CONTRAST:  ISOVUE-300 IOPAMIDOL (ISOVUE-300) INJECTION 61% COMPARISON:  CT neck 02/29/2016 FINDINGS: Pharynx and larynx: The nasopharynx and oropharynx are normal. Bilateral tonsillar hypertrophy. Airway intact. Normal vocal cords bilaterally. Proximal esophagus normal. Salivary glands: No inflammation, mass, or stone. Thyroid: Negative Lymph nodes: Scattered posterior lymph nodes on the left are stable and reactive measuring under 1 cm each. Posterior subcutaneous lymph nodes bilaterally also stable and consistent with infection. These measure under 1 cm. Vascular: Carotid artery and jugular vein patent bilaterally. Limited intracranial: Negative Visualized orbits: Negative Mastoids and visualized paranasal sinuses: Negative Skeleton: Negative Upper chest: Lung apices clear. Other: Interval improvement in inflammation in the left posterior neck subcutaneous tissues. Diffuse skin thickening has improved. Subcutaneous stranding has improved. Ill-defined fluid collection in the subcutaneous tissues on the left has improved now measuring approximately 1 cm. This is compatible with resolving abscess and adjacent cellulitis. IMPRESSION: Improving infection in the left posterior neck subcutaneous tissues. Skin thickening has improved. Subcutaneous edema has improved. Abscess has improved now measuring 1 cm. Reactive adenopathy in the neck bilaterally is similar. Electronically Signed   By: Marlan Palau M.D.   On:  03/04/2016 10:06    Anti-infectives: Anti-infectives    Start     Dose/Rate Route Frequency Ordered Stop   02/29/16 2200  vancomycin (VANCOCIN) 1,500 mg in sodium chloride 0.9 % 500 mL IVPB     1,500 mg 250 mL/hr over 120 Minutes Intravenous Every 8 hours 02/29/16 1201     02/29/16 1215  vancomycin (VANCOCIN)  1,500 mg in sodium chloride 0.9 % 500 mL IVPB  Status:  Discontinued     1,500 mg 250 mL/hr over 120 Minutes Intravenous  Once 02/29/16 1152 02/29/16 1153   02/29/16 1215  vancomycin (VANCOCIN) 1,500 mg in sodium chloride 0.9 % 500 mL IVPB     1,500 mg 250 mL/hr over 120 Minutes Intravenous  Once 02/29/16 1153 02/29/16 1450   02/29/16 1000  vancomycin (VANCOCIN) IVPB 1000 mg/200 mL premix     1,000 mg 200 mL/hr over 60 Minutes Intravenous  Once 02/29/16 0957 02/29/16 1214   02/29/16 0600  levofloxacin (LEVAQUIN) IVPB 750 mg     750 mg 100 mL/hr over 90 Minutes Intravenous  Once 02/29/16 0551 02/29/16 0758       Assessment/Plan Cervical hidradenitis w/abscess - CT 10/17 showed improving infection, abscess smaller. - WBC 4.8, afebrile - pain improving  Plan - continue conservative management with IV antibiotics, warm soaks.   When ready for discharge would recommend 2 weeks total of antibiotics (ie 2 weeks minus the amount of time he has received IV antibiotics) -- bactrim, and   Follow-up with Dr. Derrell Lollingamirez in 1-2 weeks if [facility].   LOS: 5 days    Edson SnowballBROOKE A MILLER , [facility] 03/05/2016, 10:43 AM Pager: [phone] Consults: [phone] Mon-Fri 7:00 am-4:30 pm Sat-Sun 7:00 am-11:30 am  Agree with above.  Ovidio Kinavid Salsabeel Gorelick, MD, [facility] Pager: [phone] Office phone:  [phone]

## 2016-03-05 NOTE — Discharge Summary (Signed)
PATIENT DETAILS Name: Albert White Age: 40 y.o. Sex: male Date of Birth: Apr 10, 1976 MRN: [id number]. Admitting Physician: Eduard Clos, MD [id number], Lorin Picket, MD  Admit Date: 02/29/2016 Discharge date: 03/05/2016  Recommendations for Outpatient Follow-up:  1. Follow up with PCP in 1-2 weeks 2. Please obtain BMP/CBC in one week 3. Patient will need referral to [facility] hidradenitis clinic  Admitted From:  Home  Disposition: Home   Home Health: No  Equipment/Devices: None  Discharge Condition: Stable  CODE STATUS: FULL CODE  Diet recommendation:  Carb Modified   Brief Summary: See H&P, Labs, Consult and Test reports for all details in brief, patient is a 40 year old male with long-standing history of hydradenitis mostly involving the posterior aspect of his neck, admitted with a neck abscess, he was managed with incision and drainage and empiric antibiotics. By day of discharge, significantly improved, see below for further details.  Brief Hospital Course: Posterior neck abscess with cellulitis: Has a long-standing history of hidradenitis mostly involving the posterior aspect of his neck, he was managed in the outpatient setting with Bactrim and Keflex-therefore he was admitted and started on empiric antibiotics, he required incision and drainage 2. He was followed by infectious disease and general surgery, repeat CT scan of the neck on 10/17 showed improving infection with decreasing size of the abscess. No further surgical interventions were recommended by general surgery, infectious disease-spoke with Dr. Drue Second over the phone-recommending doxycycline and Keflex for a total of 2 weeks. Recommendations are to follow up with hidradenitis clinic at [facility].  Diabetes type 2, non-insulin dependent, uncontrolled: A1c 8.6, will increase metformin to 1000 mg twice a day. Further optimization deferred to the patient's primary care practitioner  Hyperlipidemia:Continue  statin  Insomnia:Trazodone qhs  Procedures/Studies:  I&D  Discharge Diagnoses:  Principal Problem:   Cellulitis Active Problems:   Hidradenitis suppurativa, neck and scalp   DM (diabetes mellitus) (HCC)   HLD (hyperlipidemia)   Neck abscess   Discharge Instructions:  Activity:  As tolerated with Full fall precautions use walker/cane & assistance as needed  Discharge Instructions    Ambulatory referral to Nutrition and Diabetic Education    Complete by:  As directed    Call MD for:  redness, tenderness, or signs of infection (pain, swelling, redness, odor or green/yellow discharge around incision site)    Complete by:  As directed    Call MD for:  severe uncontrolled pain    Complete by:  As directed    Diet Carb Modified    Complete by:  As directed    Increase activity slowly    Complete by:  As directed        Medication List    STOP taking these medications   oxyCODONE-acetaminophen 5-325 MG tablet Commonly known as:  PERCOCET/ROXICET   sulfamethoxazole-trimethoprim 800-160 MG tablet Commonly known as:  BACTRIM DS,SEPTRA DS     TAKE these medications   acetaminophen 325 MG tablet Commonly known as:  TYLENOL Take 2 tablets (650 mg total) by mouth every 8 (eight) hours. What changed:  when to take this  reasons to take this   atorvastatin 10 MG tablet Commonly known as:  LIPITOR Take 10 mg by mouth every evening.   cephALEXin 500 MG capsule Commonly known as:  KEFLEX Take 1 capsule (500 mg total) by mouth 3 (three) times daily. What changed:  how much to take  how to take this  when to take this  additional instructions   doxycycline 100 MG  tablet Commonly known as:  VIBRA-TABS Take 1 tablet (100 mg total) by mouth 2 (two) times daily.   metFORMIN 500 MG tablet Commonly known as:  GLUCOPHAGE Take 2 tablets (1,000 mg total) by mouth 2 (two) times daily with a meal. What changed:  how much to take   OVER THE COUNTER MEDICATION Apply  1 application topically 2 (two) times daily. [employer] Eczema Cream   oxyCODONE 5 MG immediate release tablet Commonly known as:  ROXICODONE Take 1 tablet (5 mg total) by mouth every 6 (six) hours as needed for severe pain. What changed:  how much to take   traZODone 100 MG tablet Commonly known as:  DESYREL Take 100 mg by mouth at bedtime.      Follow-up Information    Lajean Saver, MD. Call today.   Specialty:  General Surgery Why:  1-2 weeks Contact information: [address] [place] [address] [phone]        Alysia Penna, MD. Schedule an appointment as soon as possible for a visit in 1 week(s).   Specialty:  Internal Medicine Contact information: [address] [place] [address] [phone]        [facility] with Dr. Janyth Contes [phone]. Schedule an appointment as soon as possible for a visit in 1 week(s).          No Known Allergies  Consultations:   ID and general surgery   Other Procedures/Studies: Ct Soft Tissue Neck W Contrast  Result Date: 03/04/2016 CLINICAL DATA:  Followup neck abscess. EXAM: CT NECK WITH CONTRAST TECHNIQUE: Multidetector CT imaging of the neck was performed using the standard protocol following the bolus administration of intravenous contrast. CONTRAST:  ISOVUE-300 IOPAMIDOL (ISOVUE-300) INJECTION 61% COMPARISON:  CT neck 02/29/2016 FINDINGS: Pharynx and larynx: The nasopharynx and oropharynx are normal. Bilateral tonsillar hypertrophy. Airway intact. Normal vocal cords bilaterally. Proximal esophagus normal. Salivary glands: No inflammation, mass, or stone. Thyroid: Negative Lymph nodes: Scattered posterior lymph nodes on the left are stable and reactive measuring under 1 cm each. Posterior subcutaneous lymph nodes bilaterally also stable and consistent with infection. These measure under 1 cm. Vascular: Carotid artery and jugular vein patent bilaterally. Limited  intracranial: Negative Visualized orbits: Negative Mastoids and visualized paranasal sinuses: Negative Skeleton: Negative Upper chest: Lung apices clear. Other: Interval improvement in inflammation in the left posterior neck subcutaneous tissues. Diffuse skin thickening has improved. Subcutaneous stranding has improved. Ill-defined fluid collection in the subcutaneous tissues on the left has improved now measuring approximately 1 cm. This is compatible with resolving abscess and adjacent cellulitis. IMPRESSION: Improving infection in the left posterior neck subcutaneous tissues. Skin thickening has improved. Subcutaneous edema has improved. Abscess has improved now measuring 1 cm. Reactive adenopathy in the neck bilaterally is similar. Electronically Signed   By: Marlan Palau M.D.   On: 03/04/2016 10:06   Ct Soft Tissue Neck W Contrast  Result Date: 02/29/2016 CLINICAL DATA:  40 y/o M; 16 years with chronic neck abscess post incision and drainage 2 days ago. EXAM: CT NECK WITH CONTRAST TECHNIQUE: Multidetector CT imaging of the neck was performed using the standard protocol following the bolus administration of intravenous contrast. CONTRAST:  ISOVUE-300 IOPAMIDOL (ISOVUE-300) INJECTION 61% COMPARISON:  12/15/2013 CT of the neck FINDINGS: Pharynx and larynx: Normal. No mass or swelling. Salivary glands: No inflammation, mass, or stone. Thyroid: Normal. Lymph nodes: Occipital and Left posterior cervical lymphadenopathy is likely reactive. Vascular: Negative. Limited intracranial: Negative. Visualized orbits:  Negative. Mastoids and visualized paranasal sinuses: Clear. Skeleton: No acute or aggressive process. Upper chest: Negative. Other: There is a fluid collection in the left posterior lower neck subcutaneous fat measuring 13 x 38 x 11 mm (AP x ML x CC), series 4, image 56 and series 10, image 91. There is extensive surrounding stranding of subcutaneous fat and dermal thickening extending to the left  suprascapular region inferiorly, the left posterior cervical triangle anteriorly, and across the midline to soft tissues of the right posterior neck. Additionally, there is minimal stranding within the left-sided paraspinal muscles posterior deep IMPRESSION: 1. Draining fluid collection at the left posterior lower neck subcutaneous fat measuring up to 38 mm is consistent with abscess. 2. Surrounding inflammatory changes throughout the dermis and subcutaneous fat of the posterior neck and left suprascapular region, also extending into deep cervical spaces of the left posterior triangle and left paraspinal muscles may be reactive or represent associated cellulitis. Electronically Signed   By: Mitzi Hansen M.D.   On: 02/29/2016 05:26     TODAY-DAY OF DISCHARGE:  Subjective:   Webb Silversmith today has no headache,no chest abdominal pain,no new weakness tingling or numbness, feels much better wants to go home today.   Objective:   Blood pressure 128/74, pulse 75, temperature 98.3 F (36.8 C), temperature source Oral, resp. rate 16, height 5' 10.5" (1.791 m), weight (!) 181.3 kg (399 lb 11.2 oz), SpO2 98 %.  Intake/Output Summary (Last 24 hours) at 03/05/16 1116 Last data filed at 03/05/16 0900  Gross per 24 hour  Intake             1080 ml  Output             3375 ml  Net            -2295 ml   Filed Weights   02/29/16 0900 02/29/16 2039 03/01/16 2100  Weight: (!) 175 kg (385 lb 11.2 oz) (!) 176.1 kg (388 lb 4.8 oz) (!) 181.3 kg (399 lb 11.2 oz)    Exam: Awake Alert, Oriented *3, No new F.N deficits, Normal affect [place].AT,PERRAL Supple Neck,No JVD, No cervical lymphadenopathy appriciated.  Symmetrical Chest wall movement, Good air movement bilaterally, CTAB RRR,No Gallops,Rubs or new Murmurs, No Parasternal Heave +ve B.Sounds, Abd Soft, Non tender, No organomegaly appriciated, No rebound -guarding or rigidity. No Cyanosis, Clubbing or edema, No new Rash or bruise   PERTINENT  RADIOLOGIC STUDIES: Ct Soft Tissue Neck W Contrast  Result Date: 03/04/2016 CLINICAL DATA:  Followup neck abscess. EXAM: CT NECK WITH CONTRAST TECHNIQUE: Multidetector CT imaging of the neck was performed using the standard protocol following the bolus administration of intravenous contrast. CONTRAST:  ISOVUE-300 IOPAMIDOL (ISOVUE-300) INJECTION 61% COMPARISON:  CT neck 02/29/2016 FINDINGS: Pharynx and larynx: The nasopharynx and oropharynx are normal. Bilateral tonsillar hypertrophy. Airway intact. Normal vocal cords bilaterally. Proximal esophagus normal. Salivary glands: No inflammation, mass, or stone. Thyroid: Negative Lymph nodes: Scattered posterior lymph nodes on the left are stable and reactive measuring under 1 cm each. Posterior subcutaneous lymph nodes bilaterally also stable and consistent with infection. These measure under 1 cm. Vascular: Carotid artery and jugular vein patent bilaterally. Limited intracranial: Negative Visualized orbits: Negative Mastoids and visualized paranasal sinuses: Negative Skeleton: Negative Upper chest: Lung apices clear. Other: Interval improvement in inflammation in the left posterior neck subcutaneous tissues. Diffuse skin thickening has improved. Subcutaneous stranding has improved. Ill-defined fluid collection in the subcutaneous tissues on the left has improved now measuring approximately  1 cm. This is compatible with resolving abscess and adjacent cellulitis. IMPRESSION: Improving infection in the left posterior neck subcutaneous tissues. Skin thickening has improved. Subcutaneous edema has improved. Abscess has improved now measuring 1 cm. Reactive adenopathy in the neck bilaterally is similar. Electronically Signed   By: Marlan Palau M.D.   On: 03/04/2016 10:06   Ct Soft Tissue Neck W Contrast  Result Date: 02/29/2016 CLINICAL DATA:  40 y/o M; 16 years with chronic neck abscess post incision and drainage 2 days ago. EXAM: CT NECK WITH CONTRAST  TECHNIQUE: Multidetector CT imaging of the neck was performed using the standard protocol following the bolus administration of intravenous contrast. CONTRAST:  ISOVUE-300 IOPAMIDOL (ISOVUE-300) INJECTION 61% COMPARISON:  12/15/2013 CT of the neck FINDINGS: Pharynx and larynx: Normal. No mass or swelling. Salivary glands: No inflammation, mass, or stone. Thyroid: Normal. Lymph nodes: Occipital and Left posterior cervical lymphadenopathy is likely reactive. Vascular: Negative. Limited intracranial: Negative. Visualized orbits: Negative. Mastoids and visualized paranasal sinuses: Clear. Skeleton: No acute or aggressive process. Upper chest: Negative. Other: There is a fluid collection in the left posterior lower neck subcutaneous fat measuring 13 x 38 x 11 mm (AP x ML x CC), series 4, image 56 and series 10, image 91. There is extensive surrounding stranding of subcutaneous fat and dermal thickening extending to the left suprascapular region inferiorly, the left posterior cervical triangle anteriorly, and across the midline to soft tissues of the right posterior neck. Additionally, there is minimal stranding within the left-sided paraspinal muscles posterior deep IMPRESSION: 1. Draining fluid collection at the left posterior lower neck subcutaneous fat measuring up to 38 mm is consistent with abscess. 2. Surrounding inflammatory changes throughout the dermis and subcutaneous fat of the posterior neck and left suprascapular region, also extending into deep cervical spaces of the left posterior triangle and left paraspinal muscles may be reactive or represent associated cellulitis. Electronically Signed   By: Mitzi Hansen M.D.   On: 02/29/2016 05:26     PERTINENT LAB RESULTS: CBC:  Recent Labs  03/04/16 0355 03/05/16 0702  WBC 4.8 4.8  HGB 14.1 14.6  HCT 41.8 42.3  PLT 181 161   CMET CMP     Component Value Date/Time   NA 136 03/05/2016 0702   K 4.1 03/05/2016 0702   CL 106  03/05/2016 0702   CO2 20 (L) 03/05/2016 0702   GLUCOSE 153 (H) 03/05/2016 0702   BUN 8 03/05/2016 0702   CREATININE 0.66 03/05/2016 0702   CALCIUM 8.7 (L) 03/05/2016 0702   PROT 5.9 (L) 12/14/2013 0805   ALBUMIN 2.9 (L) 12/14/2013 0805   AST 19 12/14/2013 0805   ALT 21 12/14/2013 0805   ALKPHOS 66 12/14/2013 0805   BILITOT 0.4 12/14/2013 0805   GFRNONAA >60 03/05/2016 0702   GFRAA >60 03/05/2016 0702    GFR Estimated Creatinine Clearance: 205.2 mL/min (by C-G formula based on SCr of 0.66 mg/dL). No results for input(s): LIPASE, AMYLASE in the last 72 hours. No results for input(s): CKTOTAL, CKMB, CKMBINDEX, TROPONINI in the last 72 hours. Invalid input(s): POCBNP No results for input(s): DDIMER in the last 72 hours. No results for input(s): HGBA1C in the last 72 hours. No results for input(s): CHOL, HDL, LDLCALC, TRIG, CHOLHDL, LDLDIRECT in the last 72 hours. No results for input(s): TSH, T4TOTAL, T3FREE, THYROIDAB in the last 72 hours.  Invalid input(s): FREET3 No results for input(s): VITAMINB12, FOLATE, FERRITIN, TIBC, IRON, RETICCTPCT in the last 72 hours. Coags: No  results for input(s): INR in the last 72 hours.  Invalid input(s): PT Microbiology: Recent Results (from the past 240 hour(s))  Wound or Superficial Culture     Status: None   Collection Time: 02/29/16  5:48 AM  Result Value Ref Range Status   Specimen Description NECK  Final   Special Requests NONE  Final   Gram Stain   Final    FEW WBC PRESENT, PREDOMINANTLY PMN FEW SQUAMOUS EPITHELIAL CELLS PRESENT RARE GRAM POSITIVE COCCI IN PAIRS RARE GRAM POSITIVE RODS    Culture   Final    NORMAL SKIN FLORA [facility]    Report Status 03/03/2016 FINAL  Final    FURTHER DISCHARGE INSTRUCTIONS:  Get Medicines reviewed and adjusted: Please take all your medications with you for your next visit with your Primary MD  Laboratory/radiological data: Please request your Primary MD to go  over all hospital tests and procedure/radiological results at the follow up, please ask your Primary MD to get all Hospital records sent to his/her office.  In some cases, they will be blood work, cultures and biopsy results pending at the time of your discharge. Please request that your primary care M.D. goes through all the records of your hospital data and follows up on these results.  Also Note the following: If you experience worsening of your admission symptoms, develop shortness of breath, life threatening emergency, suicidal or homicidal thoughts you must seek medical attention immediately by calling 911 or calling your MD immediately  if symptoms less severe.  You must read complete instructions/literature along with all the possible adverse reactions/side effects for all the Medicines you take and that have been prescribed to you. Take any new Medicines after you have completely understood and accpet all the possible adverse reactions/side effects.   Do not drive when taking Pain medications or sleeping medications (Benzodaizepines)  Do not take more than prescribed Pain, Sleep and Anxiety Medications. It is not advisable to combine anxiety,sleep and pain medications without talking with your primary care practitioner  Special Instructions: If you have smoked or chewed Tobacco  in the last 2 yrs please stop smoking, stop any regular Alcohol  and or any Recreational drug use.  Wear Seat belts while driving.  Please note: You were cared for by a hospitalist during your hospital stay. Once you are discharged, your primary care physician will handle any further medical issues. Please note that NO REFILLS for any discharge medications will be authorized once you are discharged, as it is imperative that you return to your primary care physician (or establish a relationship with a primary care physician if you do not have one) for your post hospital discharge needs so that they can reassess your  need for medications and monitor your lab values.  Total Time spent coordinating discharge including counseling, education and face to face time equals  45 minutes.  SignedJeoffrey Massed: Eular Panek 03/05/2016 11:16 AM

## 2016-03-05 NOTE — Progress Notes (Signed)
Charlyne PetrinSteven T Depaul to be D/C'd  To home per MD order.  Discussed prescriptions and follow up appointments with the patient. Prescriptions given to patient, medication list explained in detail. Pt verbalized understanding.    Medication List    STOP taking these medications   oxyCODONE-acetaminophen 5-325 MG tablet Commonly known as:  PERCOCET/ROXICET   sulfamethoxazole-trimethoprim 800-160 MG tablet Commonly known as:  BACTRIM DS,SEPTRA DS     TAKE these medications   acetaminophen 325 MG tablet Commonly known as:  TYLENOL Take 2 tablets (650 mg total) by mouth every 8 (eight) hours. What changed:  when to take this  reasons to take this   atorvastatin 10 MG tablet Commonly known as:  LIPITOR Take 10 mg by mouth every evening.   cephALEXin 500 MG capsule Commonly known as:  KEFLEX Take 1 capsule (500 mg total) by mouth 3 (three) times daily. What changed:  how much to take  how to take this  when to take this  additional instructions   doxycycline 100 MG tablet Commonly known as:  VIBRA-TABS Take 1 tablet (100 mg total) by mouth 2 (two) times daily.   metFORMIN 500 MG tablet Commonly known as:  GLUCOPHAGE Take 2 tablets (1,000 mg total) by mouth 2 (two) times daily with a meal. What changed:  how much to take   OVER THE COUNTER MEDICATION Apply 1 application topically 2 (two) times daily. [employer] Eczema Cream   oxyCODONE 5 MG immediate release tablet Commonly known as:  ROXICODONE Take 1 tablet (5 mg total) by mouth every 6 (six) hours as needed for severe pain. What changed:  how much to take   traZODone 100 MG tablet Commonly known as:  DESYREL Take 100 mg by mouth at bedtime.       Vitals:   03/05/16 0612 03/05/16 0900  BP: 113/63 128/74  Pulse: 71 75  Resp: 16 16  Temp: 97.5 F (36.4 C) 98.3 F (36.8 C)    Skin clean, dry and intact without evidence of skin break down, no evidence of skin tears noted. IV catheter discontinued intact. Site  without signs and symptoms of complications. Dressing and pressure applied. Pt denies pain at this time. No complaints noted.  An After Visit Summary was printed and given to the patient. Patient escorted via WC, and D/C home via private auto.  Mariann BarterKellie Naryah Clenney BSN, RN [facility] Phone [phone]

## 2016-03-05 NOTE — Discharge Instructions (Signed)
Please follow up with your primary care doctor for further management of your hidradenitis. We have started the referral to [facility]. Your primary care doctor will need to assist in this referral process as well. The information for the clinic is:  [facility]; Dr. Janyth ContesSayed [phone])  Hidradenitis Suppurativa Hidradenitis suppurativa is a long-term (chronic) skin disease that starts with blocked sweat glands or hair follicles. Bacteria may grow in these blocked openings of your skin. Hidradenitis suppurativa is like a severe form of acne that develops in areas of your body where acne would be unusual. It is most likely to affect the areas of your body where skin rubs against skin and becomes moist. This includes your:  Underarms.  Groin.  Genital areas.  Buttocks.  Upper thighs.  Breasts. Hidradenitis suppurativa may start out with small pimples. The pimples can develop into deep sores that break open (rupture) and drain pus. Over time your skin may thicken and become scarred. Hidradenitis suppurativa cannot be passed from person to person.  CAUSES  The exact cause of hidradenitis suppurativa is not known. This condition may be due to:  Male and male hormones. The condition is rare before and after puberty.  An overactive body defense system (immune system). Your immune system may overreact to the blocked hair follicles or sweat glands and cause swelling and pus-filled sores. RISK FACTORS You may have a higher risk of hidradenitis suppurativa if you:  Are a woman.  Are between ages 6711 and 2455.  Have a family history of hidradenitis suppurativa.  Have a personal history of acne.  Are overweight.  Smoke.  Take the drug lithium. SIGNS AND SYMPTOMS  The first signs of an outbreak are usually painful skin bumps that look like pimples. As the condition progresses:  Skin bumps may get bigger and grow deeper into the skin.  Bumps under  the skin may rupture and drain smelly pus.  Skin may become itchy and infected.  Skin may thicken and scar.  Drainage may continue through tunnels under the skin (fistulas).  Walking and moving your arms can become painful. DIAGNOSIS  Your health care provider may diagnose hidradenitis suppurativa based on your medical history and your signs and symptoms. A physical exam will also be done. You may need to see a health care provider who specializes in skin diseases (dermatologist). You may also have tests done to confirm the diagnosis. These can include:  Swabbing a sample of pus or drainage from your skin so it can be sent to the lab and tested for infection.  Blood tests to check for infection. TREATMENT  The same treatment will not work for everybody with hidradenitis suppurativa. Your treatment will depend on how severe your symptoms are. You may need to try several treatments to find what works best for you. Part of your treatment may include cleaning and bandaging (dressing) your wounds. You may also have to take medicines, such as the following:  Antibiotics.  Acne medicines.  Medicines to block or suppress the immune system.  A diabetes medicine (metformin) is sometimes used to treat this condition.  For women, birth control pills can sometimes help relieve symptoms. You may need surgery if you have a severe case of hidradenitis suppurativa that does not respond to medicine. Surgery may involve:   Using a laser to clear the skin and remove hair follicles.  Opening and draining deep sores.  Removing the areas of skin that are diseased and scarred. HOME CARE  INSTRUCTIONS  Learn as much as you can about your disease, and work closely with your health care providers.  Take medicines only as directed by your health care provider.  If you were prescribed an antibiotic medicine, finish it all even if you start to feel better.  If you are overweight, losing weight may be very  helpful. Try to reach and maintain a healthy weight.  Do not use any tobacco products, including cigarettes, chewing tobacco, or electronic cigarettes. If you need help quitting, ask your health care provider.  Do not shave the areas where you get hidradenitis suppurativa.  Do not wear deodorant.  Wear loose-fitting clothes.  Try not to overheat and get sweaty.  Take a daily bleach bath as directed by your health care provider.  Fill your bathtub halfway with water.  Pour in  cup of unscented household bleach.  Soak for 5-10 minutes.  Cover sore areas with a warm, clean washcloth (compress) for 5-10 minutes. SEEK MEDICAL CARE IF:   You have a flare-up of hidradenitis suppurativa.  You have chills or a fever.  You are having trouble controlling your symptoms at home.   This information is not intended to replace advice given to you by your health care provider. Make sure you discuss any questions you have with your health care provider.   Document Released: 12/18/2003 Document Revised: 05/26/2014 Document Reviewed: 08/05/2013 Elsevier Interactive Patient Education [employer]

## 2016-04-15 ENCOUNTER — Telehealth: Payer: Self-pay | Admitting: Neurology

## 2016-04-15 DIAGNOSIS — G4733 Obstructive sleep apnea (adult) (pediatric): Secondary | ICD-10-CM

## 2016-04-15 DIAGNOSIS — R351 Nocturia: Secondary | ICD-10-CM

## 2016-04-15 DIAGNOSIS — G4710 Hypersomnia, unspecified: Secondary | ICD-10-CM

## 2016-04-15 DIAGNOSIS — R51 Headache: Secondary | ICD-10-CM

## 2016-04-15 DIAGNOSIS — G4761 Periodic limb movement disorder: Secondary | ICD-10-CM

## 2016-04-15 DIAGNOSIS — G2581 Restless legs syndrome: Secondary | ICD-10-CM

## 2016-04-15 DIAGNOSIS — R519 Headache, unspecified: Secondary | ICD-10-CM

## 2016-04-15 NOTE — Telephone Encounter (Signed)
Patient called to schedule his sleep study.  I know he had an order earlier this year however I removed it from my workqueue when he told me he couldn't afford it.  Can I get a new order?

## 2016-04-15 NOTE — Telephone Encounter (Signed)
Order is in.

## 2016-04-29 ENCOUNTER — Ambulatory Visit (INDEPENDENT_AMBULATORY_CARE_PROVIDER_SITE_OTHER): Payer: 59 | Admitting: Neurology

## 2016-04-29 DIAGNOSIS — G4720 Circadian rhythm sleep disorder, unspecified type: Secondary | ICD-10-CM

## 2016-04-29 DIAGNOSIS — G4733 Obstructive sleep apnea (adult) (pediatric): Secondary | ICD-10-CM | POA: Diagnosis not present

## 2016-05-07 ENCOUNTER — Telehealth: Payer: Self-pay

## 2016-05-07 NOTE — Telephone Encounter (Signed)
I called pt to discuss his sleep study results. No answer, VM not set up, will try again later.

## 2016-05-07 NOTE — Telephone Encounter (Signed)
Pt returned my call. I advised him that Dr. Frances FurbishAthar reviewed his sleep study results and wants him to know that he has severe sleep apnea but did well with cpap so she recommends that pt start cpap at home. Pt is agreeable to this. I advised pt that a DME will call him within a week to get his cpap set up. I reviewed the importance of cpap compliance. A follow up appt was made for 07/31/16 at 1:00pm with Dr. Frances FurbishAthar. Pt verbalized understanding of results. Pt had no questions at this time but was encouraged to call back if questions arise. Will send to [facility].

## 2016-05-07 NOTE — Telephone Encounter (Signed)
-----   Message from Huston FoleySaima Athar, MD sent at 05/07/2016  8:20 AM EST ----- Albert White:  Patient referred by Dr. Link SnufferHolwerda, seen by me on 11/08/15, split study on 04/29/16. Please call and notify patient that the recent sleep study confirmed the diagnosis of severe OSA. He did well with CPAP during the study with significant improvement of the respiratory events. Therefore, I would like start the patient on CPAP therapy at home by prescribing a machine for home use. I placed the order in the chart. The patient will need a follow up appointment with me in 8 to 10 weeks post set up that has to be scheduled; please go ahead and schedule while you have the patient on the phone and make sure patient understands the importance of keeping this window for the FU appointment, as it is often an insurance requirement and failing to adhere to this may result in losing coverage for sleep apnea treatment.  Please re-enforce the importance of compliance with treatment and the need for us to monitor compliance data - again an insurance requirement and good feedback for the patient as far as how they are doing.  Also remind patient, that any upcoming CPAP machine or mask issues, should be first addressed with the DME company. Please ask if patient has a preference regarding DME company.  Please arrange for CPAP set up at home through a DME company of patient's choice - once you have spoken to the patient - and faxed/routed report to PCP and referring MD (if other than PCP), you can close this encounter, thanks,   Huston FoleySaima Athar, MD, PhD [facility] ([facility])

## 2016-05-07 NOTE — Progress Notes (Signed)
[name]:  Patient referred by Dr. Link SnufferHolwerda, seen by me on 11/08/15, split study on 04/29/16. Please call and notify patient that the recent sleep study confirmed the diagnosis of severe OSA. He did well with CPAP during the study with significant improvement of the respiratory events. Therefore, I would like start the patient on CPAP therapy at home by prescribing a machine for home use. I placed the order in the chart. The patient will need a follow up appointment with me in 8 to 10 weeks post set up that has to be scheduled; please go ahead and schedule while you have the patient on the phone and make sure patient understands the importance of keeping this window for the FU appointment, as it is often an insurance requirement and failing to adhere to this may result in losing coverage for sleep apnea treatment.  Please re-enforce the importance of compliance with treatment and the need for us to monitor compliance data - again an insurance requirement and good feedback for the patient as far as how they are doing.  Also remind patient, that any upcoming CPAP machine or mask issues, should be first addressed with the DME company. Please ask if patient has a preference regarding DME company.  Please arrange for CPAP set up at home through a DME company of patient's choice - once you have spoken to the patient - and faxed/routed report to PCP and referring MD (if other than PCP), you can close this encounter, thanks,   Huston FoleySaima Siraj Dermody, MD, PhD [facility] ([facility]

## 2016-05-07 NOTE — Addendum Note (Signed)
Addended by: Huston FoleyATHAR, Geniva Lohnes on: 05/07/2016 08:20 AM   Modules accepted: Orders

## 2016-05-07 NOTE — Procedures (Signed)
PATIENT'S NAME:  Albert White, Albert T. DOB:      1975-10-24      MR#:    [id number]     DATE OF RECORDING: 04/29/2016 REFERRING M.D.:  Alysia PennaScott Holwerda, MD Study Performed:  Split-Night Titration Study HISTORY:  40 year old with a history of hyperlipidemia, diabetes, recurrent neck abscess, prior smoking and morbid obesity, who reports snoring and excessive daytime somnolence, as well as waking up with a sense of gasping for air and a sense of panic. He has rare morning headaches and significant nocturia. The patient endorsed the Epworth Sleepiness Scale at 24/24 points. The patient's weight 388 pounds with a height of 71 (inches), resulting in a BMI of 54.3 kg/m2. The patient's neck circumference measured 19.1 inches.  CURRENT MEDICATIONS: Atorvastatin and Metformin  PROCEDURE:  This is a multichannel digital polysomnogram utilizing the Somnostar 11.2 system.  Electrodes and sensors were applied and monitored per AASM Specifications.   EEG, EOG, Chin and Limb EMG, were sampled at 200 Hz.  ECG, Snore and Nasal Pressure, Thermal Airflow, Respiratory Effort, CPAP Flow and Pressure, Oximetry was sampled at 50 Hz. Digital video and audio were recorded.      BASELINE STUDY WITHOUT CPAP RESULTS:  Lights Out was at 22:10 and Lights On at 05:01 for the night. Total recording time (TRT) was 184.5, with a total sleep time (TST) of 149.5 minutes. The patient's sleep latency was 11 minutes.  REM was absent. The sleep efficiency was 81%.    SLEEP ARCHITECTURE: WASO (Wake after sleep onset) was 21 minutes, Stage N1 was 7.5 minutes, Stage N2 was 142 minutes, Stage N3 was 0 minutes and Stage R (REM sleep) was 0 minutes.  The percentages were Stage N1 5.%, Stage N2 95.%, Stage N3 and Stage R were absent.    The arousals were noted as: 4 were spontaneous, 0 were associated with PLMs, 201 were associated with respiratory events.   Audio and video analysis did not show any abnormal or unusual movements, behaviors,  phonations or vocalizations. Moderate snoring was noted. The EKG was in keeping with normal sinus rhythm (NSR).   RESPIRATORY ANALYSIS:  There were a total of 203 respiratory events:  155 obstructive apneas, 0 central apneas and 0 mixed apneas with a total of 155 apneas and an apnea index (AI) of 62.2. There were 48 hypopneas with a hypopnea index of 19.3. The patient also had 0 respiratory event related arousals (RERAs).  Snoring was noted.     The total APNEA/HYPOPNEA INDEX (AHI) was 81.5 /hour and the total RESPIRATORY DISTURBANCE INDEX was 81.5 /hour.  0 events occurred in REM sleep and 96 events in NREM. The REM AHI was 0 /hour versus a non-REM AHI of 81.5 /hour. The patient spent 0 minutes sleep time in the supine position 316 minutes in non-supine. The supine AHI was 0.0 /hour versus a non-supine AHI of 81.5 /hour.  OXYGEN SATURATION & C02:  The wake baseline 02 saturation was 96%, with the lowest being 81%. Time spent below 89% saturation equaled 70 minutes.  PERIODIC LIMB MOVEMENTS:  The patient had a total of 0 Periodic Limb Movements.  The Periodic Limb Movement (PLM) index was 0 /hour and the PLM Arousal index was 0 /hour.  TITRATION STUDY WITH CPAP RESULTS:   The patient was fitted with small P10 nasal pillows. CPAP was initiated at 5 cmH20 with heated humidity per AASM split night standards and pressure was advanced to 8 cmH20 because of hypopneas, apneas and desaturations.  At  a PAP pressure of 8 cmH20, there was a reduction of the AHI to 0 /hour, O2 nadir of 93% with non-supine REM sleep achieved.   Total recording time (TRT) was 190.5 minutes, with a total sleep time (TST) of 166 minutes. The patient's sleep latency was 18 minutes. REM latency was 52 minutes.  The sleep efficiency was 87.1 %.    SLEEP ARCHITECTURE: Wake after sleep was 6.5 minutes, Stage N1 8 minutes, Stage N2 109 minutes, Stage N3 0 minutes and Stage R (REM sleep) 49 minutes. The percentages were: Stage N1 4.8%,  Stage N2 65.7%, Stage N3 is absent, and Stage R (REM sleep) 29.5%, which is increased (rebound).   The arousals were noted as: 9 were spontaneous, 1 were associated with PLMs, 5 were associated with respiratory events.  RESPIRATORY ANALYSIS:  There were a total of 5 respiratory events: 0 obstructive apneas, 0 central apneas and 0 mixed apneas with a total of 0 apneas and an apnea index (AI) of 0. There were 5 hypopneas with a hypopnea index of 1.8 /hour. The patient also had 0 respiratory event related arousals (RERAs).      The total APNEA/HYPOPNEA INDEX  (AHI) was 1.8 /hour and the total RESPIRATORY DISTURBANCE INDEX was 1.8 /hour.  4 events occurred in REM sleep and 1 events in NREM. The REM AHI was 4.9 /hour versus a non-REM AHI of .5 /hour. REM sleep was achieved on a pressure of  cm/h2o (AHI was  .) The patient spent 0% of total sleep time in the supine position. The supine AHI was 0.0 /hour, versus a non-supine AHI of 1.8/hour.  OXYGEN SATURATION & C02:  The wake baseline 02 saturation was 96%, with the lowest being 89%. Time spent below 89% saturation equaled 0 minutes.  PERIODIC LIMB MOVEMENTS:    The patient had a total of 9 Periodic Limb Movements. The Periodic Limb Movement (PLM) index was 3.3 /hour and the PLM Arousal index was .4 /hour.   Post-study, the patient indicated that sleep was the same as usual.    POLYSOMNOGRAPHY IMPRESSION :   1. Obstructive Sleep Apnea (OSA)  2. Dysfunctions associated with sleep stages or arousals from sleep  RECOMMENDATIONS:  1. This patient has severe obstructive sleep apnea and responded well on CPAP therapy. Of note, the absence of supine sleep and absence of REM sleep during the diagnostic part of the study likely underestimates, the AHI and O2 nadir. I will start the patient on home CPAP treatment at a pressure of 8 cm via small nasal pillows with heated humidity. The patient should be reminded to be fully compliant with PAP therapy to  improve sleep related symptoms and decrease long term cardiovascular risks. Please note that untreated obstructive sleep apnea carries additional perioperative morbidity. Patients with significant obstructive sleep apnea should receive perioperative PAP therapy and the surgeons and particularly the anesthesiologist should be informed of the diagnosis and the severity of the sleep disordered breathing. 2. The patient should be cautioned not to drive, work at heights, or operate dangerous or heavy equipment when tired or sleepy. Review and reiteration of good sleep hygiene measures should be pursued with any patient. 3. This study shows sleep fragmentation and abnormal sleep stage percentages; these are nonspecific findings and per se do not signify an intrinsic sleep disorder or a cause for the patient's sleep-related symptoms. Causes include (but are not limited to) the first night effect of the sleep study, circadian rhythm disturbances, medication effect or an underlying  mood disorder or medical problem.  4. The patient will be seen in [facility] for discussion of the test results and further management strategies. The referring provider will be notified of the test results.    I certify that I have reviewed the entire raw data recording prior to the issuance of this report in accordance with the Standards of Accreditation of the American Academy of Sleep Medicine (AASM)       Huston FoleySaima Flor Whitacre, MD, PhD Diplomat, American Board of Psychiatry and Neurology (Neurology and Sleep Medicine)

## 2016-06-25 DIAGNOSIS — G4733 Obstructive sleep apnea (adult) (pediatric): Secondary | ICD-10-CM | POA: Insufficient documentation

## 2016-06-25 DIAGNOSIS — Z6841 Body Mass Index (BMI) 40.0 and over, adult: Secondary | ICD-10-CM

## 2016-06-25 DIAGNOSIS — E119 Type 2 diabetes mellitus without complications: Secondary | ICD-10-CM | POA: Diagnosis not present

## 2016-06-25 DIAGNOSIS — I10 Essential (primary) hypertension: Secondary | ICD-10-CM | POA: Diagnosis not present

## 2016-06-25 DIAGNOSIS — E559 Vitamin D deficiency, unspecified: Secondary | ICD-10-CM | POA: Insufficient documentation

## 2016-07-03 DIAGNOSIS — G4733 Obstructive sleep apnea (adult) (pediatric): Secondary | ICD-10-CM | POA: Diagnosis not present

## 2016-07-09 DIAGNOSIS — Z01818 Encounter for other preprocedural examination: Secondary | ICD-10-CM | POA: Diagnosis not present

## 2016-07-17 DIAGNOSIS — Z7189 Other specified counseling: Secondary | ICD-10-CM | POA: Diagnosis not present

## 2016-07-21 DIAGNOSIS — E119 Type 2 diabetes mellitus without complications: Secondary | ICD-10-CM | POA: Diagnosis not present

## 2016-07-21 DIAGNOSIS — E782 Mixed hyperlipidemia: Secondary | ICD-10-CM | POA: Diagnosis not present

## 2016-07-21 DIAGNOSIS — Z713 Dietary counseling and surveillance: Secondary | ICD-10-CM | POA: Diagnosis not present

## 2016-07-31 ENCOUNTER — Ambulatory Visit: Payer: Self-pay | Admitting: Neurology

## 2016-07-31 DIAGNOSIS — G4733 Obstructive sleep apnea (adult) (pediatric): Secondary | ICD-10-CM | POA: Diagnosis not present

## 2016-08-08 DIAGNOSIS — E119 Type 2 diabetes mellitus without complications: Secondary | ICD-10-CM | POA: Diagnosis not present

## 2016-08-08 DIAGNOSIS — Z87891 Personal history of nicotine dependence: Secondary | ICD-10-CM | POA: Insufficient documentation

## 2016-08-08 DIAGNOSIS — E782 Mixed hyperlipidemia: Secondary | ICD-10-CM | POA: Diagnosis not present

## 2016-08-19 DIAGNOSIS — G4733 Obstructive sleep apnea (adult) (pediatric): Secondary | ICD-10-CM | POA: Diagnosis not present

## 2016-08-31 DIAGNOSIS — G4733 Obstructive sleep apnea (adult) (pediatric): Secondary | ICD-10-CM | POA: Diagnosis not present

## 2016-09-02 DIAGNOSIS — E119 Type 2 diabetes mellitus without complications: Secondary | ICD-10-CM | POA: Diagnosis not present

## 2016-09-02 DIAGNOSIS — Z87891 Personal history of nicotine dependence: Secondary | ICD-10-CM | POA: Diagnosis not present

## 2016-09-02 DIAGNOSIS — Z136 Encounter for screening for cardiovascular disorders: Secondary | ICD-10-CM | POA: Diagnosis not present

## 2016-09-18 DIAGNOSIS — Z0000 Encounter for general adult medical examination without abnormal findings: Secondary | ICD-10-CM | POA: Diagnosis not present

## 2016-09-18 DIAGNOSIS — E1165 Type 2 diabetes mellitus with hyperglycemia: Secondary | ICD-10-CM | POA: Diagnosis not present

## 2016-09-18 DIAGNOSIS — I10 Essential (primary) hypertension: Secondary | ICD-10-CM | POA: Diagnosis not present

## 2016-09-18 DIAGNOSIS — R1084 Generalized abdominal pain: Secondary | ICD-10-CM | POA: Diagnosis not present

## 2016-09-18 DIAGNOSIS — N390 Urinary tract infection, site not specified: Secondary | ICD-10-CM | POA: Diagnosis not present

## 2016-09-24 DIAGNOSIS — Z0000 Encounter for general adult medical examination without abnormal findings: Secondary | ICD-10-CM | POA: Diagnosis not present

## 2016-09-24 DIAGNOSIS — I10 Essential (primary) hypertension: Secondary | ICD-10-CM | POA: Diagnosis not present

## 2016-09-24 DIAGNOSIS — Z1389 Encounter for screening for other disorder: Secondary | ICD-10-CM | POA: Diagnosis not present

## 2016-09-24 DIAGNOSIS — Z713 Dietary counseling and surveillance: Secondary | ICD-10-CM | POA: Diagnosis not present

## 2016-09-24 DIAGNOSIS — Z23 Encounter for immunization: Secondary | ICD-10-CM | POA: Diagnosis not present

## 2016-09-24 DIAGNOSIS — G4733 Obstructive sleep apnea (adult) (pediatric): Secondary | ICD-10-CM | POA: Diagnosis not present

## 2016-09-25 DIAGNOSIS — L0211 Cutaneous abscess of neck: Secondary | ICD-10-CM | POA: Diagnosis not present

## 2016-09-25 DIAGNOSIS — L732 Hidradenitis suppurativa: Secondary | ICD-10-CM | POA: Diagnosis not present

## 2016-09-30 DIAGNOSIS — G4733 Obstructive sleep apnea (adult) (pediatric): Secondary | ICD-10-CM | POA: Diagnosis not present

## 2016-10-08 ENCOUNTER — Ambulatory Visit: Payer: Self-pay | Admitting: Neurology

## 2016-10-08 DIAGNOSIS — Z01818 Encounter for other preprocedural examination: Secondary | ICD-10-CM | POA: Diagnosis not present

## 2016-10-22 DIAGNOSIS — E119 Type 2 diabetes mellitus without complications: Secondary | ICD-10-CM | POA: Diagnosis not present

## 2016-10-26 DIAGNOSIS — H6123 Impacted cerumen, bilateral: Secondary | ICD-10-CM | POA: Diagnosis not present

## 2016-10-31 DIAGNOSIS — G4733 Obstructive sleep apnea (adult) (pediatric): Secondary | ICD-10-CM | POA: Diagnosis not present

## 2016-11-11 DIAGNOSIS — M545 Low back pain: Secondary | ICD-10-CM | POA: Diagnosis not present

## 2016-11-16 HISTORY — PX: GASTRIC BYPASS: SHX52

## 2016-11-20 DIAGNOSIS — M799 Soft tissue disorder, unspecified: Secondary | ICD-10-CM | POA: Diagnosis not present

## 2016-11-20 DIAGNOSIS — E11628 Type 2 diabetes mellitus with other skin complications: Secondary | ICD-10-CM | POA: Diagnosis not present

## 2016-11-20 DIAGNOSIS — Z872 Personal history of diseases of the skin and subcutaneous tissue: Secondary | ICD-10-CM | POA: Diagnosis not present

## 2016-11-20 DIAGNOSIS — L0211 Cutaneous abscess of neck: Secondary | ICD-10-CM | POA: Diagnosis not present

## 2016-11-20 DIAGNOSIS — M50323 Other cervical disc degeneration at C6-C7 level: Secondary | ICD-10-CM | POA: Diagnosis not present

## 2016-11-21 DIAGNOSIS — G4733 Obstructive sleep apnea (adult) (pediatric): Secondary | ICD-10-CM | POA: Diagnosis not present

## 2016-11-21 DIAGNOSIS — L0211 Cutaneous abscess of neck: Secondary | ICD-10-CM | POA: Diagnosis not present

## 2016-11-21 DIAGNOSIS — Z9989 Dependence on other enabling machines and devices: Secondary | ICD-10-CM | POA: Diagnosis not present

## 2016-11-21 DIAGNOSIS — Z872 Personal history of diseases of the skin and subcutaneous tissue: Secondary | ICD-10-CM | POA: Diagnosis not present

## 2016-11-25 DIAGNOSIS — E119 Type 2 diabetes mellitus without complications: Secondary | ICD-10-CM | POA: Diagnosis not present

## 2016-11-25 DIAGNOSIS — E782 Mixed hyperlipidemia: Secondary | ICD-10-CM | POA: Diagnosis not present

## 2016-11-30 DIAGNOSIS — G4733 Obstructive sleep apnea (adult) (pediatric): Secondary | ICD-10-CM | POA: Diagnosis not present

## 2016-12-02 DIAGNOSIS — Z01818 Encounter for other preprocedural examination: Secondary | ICD-10-CM | POA: Diagnosis not present

## 2016-12-04 ENCOUNTER — Ambulatory Visit: Payer: Self-pay | Admitting: Neurology

## 2016-12-08 DIAGNOSIS — Z87891 Personal history of nicotine dependence: Secondary | ICD-10-CM | POA: Diagnosis not present

## 2016-12-08 DIAGNOSIS — G4733 Obstructive sleep apnea (adult) (pediatric): Secondary | ICD-10-CM | POA: Diagnosis not present

## 2016-12-08 DIAGNOSIS — E782 Mixed hyperlipidemia: Secondary | ICD-10-CM | POA: Diagnosis not present

## 2016-12-08 DIAGNOSIS — E785 Hyperlipidemia, unspecified: Secondary | ICD-10-CM | POA: Diagnosis not present

## 2016-12-12 DIAGNOSIS — D649 Anemia, unspecified: Secondary | ICD-10-CM | POA: Diagnosis not present

## 2016-12-12 DIAGNOSIS — N492 Inflammatory disorders of scrotum: Secondary | ICD-10-CM | POA: Diagnosis not present

## 2016-12-12 DIAGNOSIS — L7632 Postprocedural hematoma of skin and subcutaneous tissue following other procedure: Secondary | ICD-10-CM | POA: Diagnosis not present

## 2016-12-12 DIAGNOSIS — N433 Hydrocele, unspecified: Secondary | ICD-10-CM | POA: Diagnosis not present

## 2016-12-12 DIAGNOSIS — I861 Scrotal varices: Secondary | ICD-10-CM | POA: Diagnosis not present

## 2016-12-12 DIAGNOSIS — S301XXA Contusion of abdominal wall, initial encounter: Secondary | ICD-10-CM | POA: Diagnosis not present

## 2016-12-24 DIAGNOSIS — Z713 Dietary counseling and surveillance: Secondary | ICD-10-CM | POA: Diagnosis not present

## 2016-12-30 DIAGNOSIS — R601 Generalized edema: Secondary | ICD-10-CM | POA: Diagnosis not present

## 2016-12-30 DIAGNOSIS — D72825 Bandemia: Secondary | ICD-10-CM | POA: Diagnosis not present

## 2016-12-30 DIAGNOSIS — N200 Calculus of kidney: Secondary | ICD-10-CM | POA: Diagnosis not present

## 2016-12-30 DIAGNOSIS — T814XXA Infection following a procedure, initial encounter: Secondary | ICD-10-CM | POA: Diagnosis not present

## 2016-12-30 DIAGNOSIS — G4733 Obstructive sleep apnea (adult) (pediatric): Secondary | ICD-10-CM | POA: Diagnosis not present

## 2016-12-30 DIAGNOSIS — R109 Unspecified abdominal pain: Secondary | ICD-10-CM | POA: Insufficient documentation

## 2016-12-30 DIAGNOSIS — G8918 Other acute postprocedural pain: Secondary | ICD-10-CM | POA: Diagnosis not present

## 2016-12-30 DIAGNOSIS — R188 Other ascites: Secondary | ICD-10-CM | POA: Diagnosis not present

## 2016-12-30 DIAGNOSIS — N281 Cyst of kidney, acquired: Secondary | ICD-10-CM | POA: Diagnosis not present

## 2016-12-30 DIAGNOSIS — K651 Peritoneal abscess: Secondary | ICD-10-CM | POA: Diagnosis not present

## 2016-12-30 DIAGNOSIS — I517 Cardiomegaly: Secondary | ICD-10-CM | POA: Diagnosis not present

## 2016-12-30 DIAGNOSIS — Z9884 Bariatric surgery status: Secondary | ICD-10-CM | POA: Diagnosis not present

## 2016-12-30 DIAGNOSIS — R1084 Generalized abdominal pain: Secondary | ICD-10-CM | POA: Diagnosis not present

## 2016-12-30 DIAGNOSIS — K91872 Postprocedural seroma of a digestive system organ or structure following a digestive system procedure: Secondary | ICD-10-CM | POA: Diagnosis not present

## 2017-01-06 DIAGNOSIS — D72825 Bandemia: Secondary | ICD-10-CM | POA: Insufficient documentation

## 2017-01-06 DIAGNOSIS — R601 Generalized edema: Secondary | ICD-10-CM | POA: Insufficient documentation

## 2017-01-14 DIAGNOSIS — R601 Generalized edema: Secondary | ICD-10-CM | POA: Diagnosis not present

## 2017-01-14 DIAGNOSIS — Z980 Intestinal bypass and anastomosis status: Secondary | ICD-10-CM | POA: Diagnosis not present

## 2017-01-14 DIAGNOSIS — R1084 Generalized abdominal pain: Secondary | ICD-10-CM | POA: Diagnosis not present

## 2017-01-14 DIAGNOSIS — E119 Type 2 diabetes mellitus without complications: Secondary | ICD-10-CM | POA: Diagnosis not present

## 2017-01-15 ENCOUNTER — Encounter (HOSPITAL_COMMUNITY): Payer: Self-pay | Admitting: Emergency Medicine

## 2017-01-15 ENCOUNTER — Inpatient Hospital Stay (HOSPITAL_COMMUNITY)
Admission: EM | Admit: 2017-01-15 | Discharge: 2017-02-03 | DRG: 862 | Disposition: A | Discharge status: Home Health | Payer: 59 | Attending: Nephrology | Admitting: Nephrology

## 2017-01-15 ENCOUNTER — Other Ambulatory Visit: Payer: Self-pay | Admitting: Internal Medicine

## 2017-01-15 ENCOUNTER — Emergency Department (HOSPITAL_COMMUNITY): Payer: 59

## 2017-01-15 ENCOUNTER — Ambulatory Visit
Admission: RE | Admit: 2017-01-15 | Discharge: 2017-01-15 | Disposition: A | Discharge status: Home / Self Care | Payer: 59 | Source: Ambulatory Visit | Attending: Internal Medicine | Admitting: Internal Medicine

## 2017-01-15 DIAGNOSIS — D638 Anemia in other chronic diseases classified elsewhere: Secondary | ICD-10-CM | POA: Diagnosis present

## 2017-01-15 DIAGNOSIS — K9589 Other complications of other bariatric procedure: Secondary | ICD-10-CM | POA: Diagnosis present

## 2017-01-15 DIAGNOSIS — L732 Hidradenitis suppurativa: Secondary | ICD-10-CM | POA: Diagnosis present

## 2017-01-15 DIAGNOSIS — T814XXA Infection following a procedure, initial encounter: Principal | ICD-10-CM | POA: Diagnosis present

## 2017-01-15 DIAGNOSIS — Y832 Surgical operation with anastomosis, bypass or graft as the cause of abnormal reaction of the patient, or of later complication, without mention of misadventure at the time of the procedure: Secondary | ICD-10-CM | POA: Diagnosis present

## 2017-01-15 DIAGNOSIS — Z23 Encounter for immunization: Secondary | ICD-10-CM | POA: Diagnosis not present

## 2017-01-15 DIAGNOSIS — D649 Anemia, unspecified: Secondary | ICD-10-CM

## 2017-01-15 DIAGNOSIS — Z87891 Personal history of nicotine dependence: Secondary | ICD-10-CM | POA: Diagnosis not present

## 2017-01-15 DIAGNOSIS — T8149XA Infection following a procedure, other surgical site, initial encounter: Secondary | ICD-10-CM

## 2017-01-15 DIAGNOSIS — R748 Abnormal levels of other serum enzymes: Secondary | ICD-10-CM

## 2017-01-15 DIAGNOSIS — J811 Chronic pulmonary edema: Secondary | ICD-10-CM

## 2017-01-15 DIAGNOSIS — K9189 Other postprocedural complications and disorders of digestive system: Secondary | ICD-10-CM | POA: Diagnosis not present

## 2017-01-15 DIAGNOSIS — G4733 Obstructive sleep apnea (adult) (pediatric): Secondary | ICD-10-CM

## 2017-01-15 DIAGNOSIS — K750 Abscess of liver: Secondary | ICD-10-CM | POA: Diagnosis present

## 2017-01-15 DIAGNOSIS — E785 Hyperlipidemia, unspecified: Secondary | ICD-10-CM | POA: Diagnosis present

## 2017-01-15 DIAGNOSIS — K529 Noninfective gastroenteritis and colitis, unspecified: Secondary | ICD-10-CM | POA: Diagnosis present

## 2017-01-15 DIAGNOSIS — R10812 Left upper quadrant abdominal tenderness: Secondary | ICD-10-CM | POA: Diagnosis not present

## 2017-01-15 DIAGNOSIS — J90 Pleural effusion, not elsewhere classified: Secondary | ICD-10-CM

## 2017-01-15 DIAGNOSIS — R109 Unspecified abdominal pain: Secondary | ICD-10-CM

## 2017-01-15 DIAGNOSIS — B954 Other streptococcus as the cause of diseases classified elsewhere: Secondary | ICD-10-CM | POA: Diagnosis present

## 2017-01-15 DIAGNOSIS — T8130XA Disruption of wound, unspecified, initial encounter: Secondary | ICD-10-CM | POA: Diagnosis present

## 2017-01-15 DIAGNOSIS — Z9884 Bariatric surgery status: Secondary | ICD-10-CM

## 2017-01-15 DIAGNOSIS — R000 Tachycardia, unspecified: Secondary | ICD-10-CM | POA: Diagnosis not present

## 2017-01-15 DIAGNOSIS — Z9989 Dependence on other enabling machines and devices: Secondary | ICD-10-CM | POA: Diagnosis not present

## 2017-01-15 DIAGNOSIS — K659 Peritonitis, unspecified: Secondary | ICD-10-CM

## 2017-01-15 DIAGNOSIS — R609 Edema, unspecified: Secondary | ICD-10-CM | POA: Diagnosis not present

## 2017-01-15 DIAGNOSIS — R0600 Dyspnea, unspecified: Secondary | ICD-10-CM | POA: Diagnosis not present

## 2017-01-15 DIAGNOSIS — L0291 Cutaneous abscess, unspecified: Secondary | ICD-10-CM | POA: Diagnosis not present

## 2017-01-15 DIAGNOSIS — K651 Peritoneal abscess: Secondary | ICD-10-CM | POA: Diagnosis present

## 2017-01-15 DIAGNOSIS — Z452 Encounter for adjustment and management of vascular access device: Secondary | ICD-10-CM | POA: Diagnosis not present

## 2017-01-15 DIAGNOSIS — R509 Fever, unspecified: Secondary | ICD-10-CM | POA: Diagnosis not present

## 2017-01-15 DIAGNOSIS — J810 Acute pulmonary edema: Secondary | ICD-10-CM

## 2017-01-15 DIAGNOSIS — N200 Calculus of kidney: Secondary | ICD-10-CM | POA: Diagnosis not present

## 2017-01-15 DIAGNOSIS — E119 Type 2 diabetes mellitus without complications: Secondary | ICD-10-CM | POA: Diagnosis present

## 2017-01-15 DIAGNOSIS — R0602 Shortness of breath: Secondary | ICD-10-CM | POA: Diagnosis not present

## 2017-01-15 DIAGNOSIS — K649 Unspecified hemorrhoids: Secondary | ICD-10-CM | POA: Diagnosis present

## 2017-01-15 DIAGNOSIS — R52 Pain, unspecified: Secondary | ICD-10-CM

## 2017-01-15 DIAGNOSIS — Z8249 Family history of ischemic heart disease and other diseases of the circulatory system: Secondary | ICD-10-CM

## 2017-01-15 DIAGNOSIS — A419 Sepsis, unspecified organism: Secondary | ICD-10-CM | POA: Diagnosis not present

## 2017-01-15 DIAGNOSIS — Y838 Other surgical procedures as the cause of abnormal reaction of the patient, or of later complication, without mention of misadventure at the time of the procedure: Secondary | ICD-10-CM | POA: Diagnosis not present

## 2017-01-15 DIAGNOSIS — B9689 Other specified bacterial agents as the cause of diseases classified elsewhere: Secondary | ICD-10-CM | POA: Diagnosis present

## 2017-01-15 DIAGNOSIS — R601 Generalized edema: Secondary | ICD-10-CM | POA: Diagnosis not present

## 2017-01-15 DIAGNOSIS — Z4682 Encounter for fitting and adjustment of non-vascular catheter: Secondary | ICD-10-CM | POA: Diagnosis not present

## 2017-01-15 DIAGNOSIS — T8143XA Infection following a procedure, organ and space surgical site, initial encounter: Secondary | ICD-10-CM

## 2017-01-15 DIAGNOSIS — I10 Essential (primary) hypertension: Secondary | ICD-10-CM

## 2017-01-15 DIAGNOSIS — Z6841 Body Mass Index (BMI) 40.0 and over, adult: Secondary | ICD-10-CM | POA: Diagnosis not present

## 2017-01-15 DIAGNOSIS — J918 Pleural effusion in other conditions classified elsewhere: Secondary | ICD-10-CM | POA: Diagnosis not present

## 2017-01-15 LAB — CBC WITH DIFFERENTIAL/PLATELET
BASOS ABS: 0 10*3/uL (ref 0.0–0.1)
BASOS PCT: 0 %
EOS PCT: 0 %
Eosinophils Absolute: 0 10*3/uL (ref 0.0–0.7)
HCT: 33.1 % — ABNORMAL LOW (ref 39.0–52.0)
Hemoglobin: 10.3 g/dL — ABNORMAL LOW (ref 13.0–17.0)
Lymphocytes Relative: 14 %
Lymphs Abs: 1.2 10*3/uL (ref 0.7–4.0)
MCH: 26.8 pg (ref 26.0–34.0)
MCHC: 31.1 g/dL (ref 30.0–36.0)
MCV: 86.2 fL (ref 78.0–100.0)
MONO ABS: 1.1 10*3/uL — AB (ref 0.1–1.0)
Monocytes Relative: 12 %
NEUTROS ABS: 6.3 10*3/uL (ref 1.7–7.7)
Neutrophils Relative %: 74 %
PLATELETS: 578 10*3/uL — AB (ref 150–400)
RBC: 3.84 MIL/uL — AB (ref 4.22–5.81)
RDW: 16.6 % — AB (ref 11.5–15.5)
WBC: 8.6 10*3/uL (ref 4.0–10.5)

## 2017-01-15 LAB — URINALYSIS, ROUTINE W REFLEX MICROSCOPIC
BILIRUBIN URINE: NEGATIVE
Glucose, UA: NEGATIVE mg/dL
HGB URINE DIPSTICK: NEGATIVE
KETONES UR: 20 mg/dL — AB
Leukocytes, UA: NEGATIVE
Nitrite: NEGATIVE
PROTEIN: NEGATIVE mg/dL
Specific Gravity, Urine: >1.046 — ABNORMAL HIGH (ref 1.005–1.030)
pH: 5 (ref 5.0–8.0)

## 2017-01-15 LAB — BRAIN NATRIURETIC PEPTIDE: B Natriuretic Peptide: 16.5 pg/mL (ref 0.0–100.0)

## 2017-01-15 LAB — COMPREHENSIVE METABOLIC PANEL
ALT: 17 U/L (ref 17–63)
AST: 27 U/L (ref 15–41)
Albumin: 2.2 g/dL — ABNORMAL LOW (ref 3.5–5.0)
Alkaline Phosphatase: 240 U/L — ABNORMAL HIGH (ref 38–126)
Anion gap: 16 — ABNORMAL HIGH (ref 5–15)
BILIRUBIN TOTAL: 1.1 mg/dL (ref 0.3–1.2)
BUN: 10 mg/dL (ref 6–20)
CO2: 26 mmol/L (ref 22–32)
CREATININE: 0.75 mg/dL (ref 0.61–1.24)
Calcium: 8.8 mg/dL — ABNORMAL LOW (ref 8.9–10.3)
Chloride: 91 mmol/L — ABNORMAL LOW (ref 101–111)
Glucose, Bld: 121 mg/dL — ABNORMAL HIGH (ref 65–99)
POTASSIUM: 3.9 mmol/L (ref 3.5–5.1)
Sodium: 133 mmol/L — ABNORMAL LOW (ref 135–145)
TOTAL PROTEIN: 7 g/dL (ref 6.5–8.1)

## 2017-01-15 LAB — PROTIME-INR
INR: 1.07
Prothrombin Time: 13.8 seconds (ref 11.4–15.2)

## 2017-01-15 LAB — CBG MONITORING, ED: Glucose-Capillary: 115 mg/dL — ABNORMAL HIGH (ref 65–99)

## 2017-01-15 LAB — I-STAT CG4 LACTIC ACID, ED
Lactic Acid, Venous: 1.65 mmol/L (ref 0.5–1.9)
Lactic Acid, Venous: 1.19 mmol/L (ref 0.5–1.9)

## 2017-01-15 MED ORDER — SODIUM CHLORIDE 0.9 % IV BOLUS (SEPSIS)
1000.0000 mL | Freq: Once | INTRAVENOUS | Status: AC
  Administered 2017-01-15: 1000 mL via INTRAVENOUS

## 2017-01-15 MED ORDER — ONDANSETRON HCL 4 MG/2ML IJ SOLN
4.0000 mg | Freq: Once | INTRAMUSCULAR | Status: AC
  Administered 2017-01-15: 4 mg via INTRAVENOUS
  Filled 2017-01-15: qty 2

## 2017-01-15 MED ORDER — ONDANSETRON HCL 4 MG/2ML IJ SOLN
4.0000 mg | Freq: Four times a day (QID) | INTRAMUSCULAR | Status: DC | PRN
  Administered 2017-01-16 – 2017-02-02 (×16): 4 mg via INTRAVENOUS
  Filled 2017-01-15 (×16): qty 2

## 2017-01-15 MED ORDER — IOPAMIDOL (ISOVUE-300) INJECTION 61%
125.0000 mL | Freq: Once | INTRAVENOUS | Status: AC | PRN
  Administered 2017-01-15: 125 mL via INTRAVENOUS

## 2017-01-15 MED ORDER — ALBUTEROL SULFATE (2.5 MG/3ML) 0.083% IN NEBU: 2.5000 mg | INHALATION_SOLUTION | Freq: Four times a day (QID) | RESPIRATORY_TRACT | Status: DC | PRN

## 2017-01-15 MED ORDER — MORPHINE SULFATE (PF) 4 MG/ML IV SOLN
4.0000 mg | Freq: Once | INTRAVENOUS | Status: AC
  Administered 2017-01-15: 4 mg via INTRAVENOUS
  Filled 2017-01-15: qty 1

## 2017-01-15 MED ORDER — HEPARIN SODIUM (PORCINE) 5000 UNIT/ML IJ SOLN
5000.0000 [IU] | Freq: Once | INTRAMUSCULAR | Status: AC
Start: 2017-01-15 — End: 2017-01-15
  Administered 2017-01-15: 5000 [IU] via SUBCUTANEOUS
  Filled 2017-01-15: qty 1

## 2017-01-15 MED ORDER — ACETAMINOPHEN 10 MG/ML IV SOLN
1000.0000 mg | Freq: Four times a day (QID) | INTRAVENOUS | Status: DC
  Administered 2017-01-16 (×3): 1000 mg via INTRAVENOUS
  Filled 2017-01-15 (×3): qty 100

## 2017-01-15 MED ORDER — HYDROMORPHONE HCL 1 MG/ML IJ SOLN
0.5000 mg | INTRAMUSCULAR | Status: DC | PRN
  Administered 2017-01-15 – 2017-01-16 (×4): 1 mg via INTRAVENOUS
  Administered 2017-01-16: 0.5 mg via INTRAVENOUS
  Administered 2017-01-16 – 2017-01-21 (×67): 1 mg via INTRAVENOUS
  Filled 2017-01-15 (×72): qty 1

## 2017-01-15 MED ORDER — DIPHENHYDRAMINE HCL 50 MG/ML IJ SOLN: 25.0000 mg | Freq: Four times a day (QID) | INTRAMUSCULAR | Status: DC | PRN

## 2017-01-15 MED ORDER — PNEUMOCOCCAL VAC POLYVALENT 25 MCG/0.5ML IJ INJ
0.5000 mL | INJECTION | INTRAMUSCULAR | Status: DC
  Filled 2017-01-15: qty 0.5

## 2017-01-15 MED ORDER — PIPERACILLIN-TAZOBACTAM 3.375 G IVPB: 3.3750 g | Freq: Three times a day (TID) | INTRAVENOUS | Status: DC

## 2017-01-15 MED ORDER — FAMOTIDINE IN NACL 20-0.9 MG/50ML-% IV SOLN
20.0000 mg | Freq: Two times a day (BID) | INTRAVENOUS | Status: DC
  Administered 2017-01-15 – 2017-01-16 (×2): 20 mg via INTRAVENOUS
  Filled 2017-01-15 (×2): qty 50

## 2017-01-15 MED ORDER — HYDROMORPHONE HCL 1 MG/ML IJ SOLN
0.5000 mg | INTRAMUSCULAR | Status: DC | PRN
  Administered 2017-01-15 (×3): 1 mg via INTRAVENOUS
  Filled 2017-01-15 (×3): qty 1

## 2017-01-15 MED ORDER — SODIUM CHLORIDE 0.9 % IV SOLN
INTRAVENOUS | Status: AC
  Administered 2017-01-15 – 2017-01-16 (×3): via INTRAVENOUS

## 2017-01-15 MED ORDER — INSULIN ASPART 100 UNIT/ML ~~LOC~~ SOLN
2.0000 [IU] | SUBCUTANEOUS | Status: DC
  Administered 2017-01-16 (×4): 2 [IU] via SUBCUTANEOUS
  Administered 2017-01-17: 4 [IU] via SUBCUTANEOUS

## 2017-01-15 MED ORDER — PIPERACILLIN-TAZOBACTAM 3.375 G IVPB
3.3750 g | Freq: Three times a day (TID) | INTRAVENOUS | Status: DC
  Administered 2017-01-15 – 2017-01-23 (×24): 3.375 g via INTRAVENOUS
  Filled 2017-01-15 (×26): qty 50

## 2017-01-15 MED ORDER — DIPHENHYDRAMINE HCL 25 MG PO CAPS: 25.0000 mg | ORAL_CAPSULE | Freq: Four times a day (QID) | ORAL | Status: DC | PRN

## 2017-01-15 MED ORDER — ONDANSETRON 4 MG PO TBDP: 4.0000 mg | ORAL_TABLET | Freq: Four times a day (QID) | ORAL | Status: DC | PRN

## 2017-01-15 MED ORDER — HYDROMORPHONE HCL 1 MG/ML IJ SOLN
1.0000 mg | Freq: Once | INTRAMUSCULAR | Status: AC
  Administered 2017-01-15: 1 mg via INTRAVENOUS
  Filled 2017-01-15: qty 1

## 2017-01-15 NOTE — ED Triage Notes (Signed)
[facility] and since then been in and out of hospital. Had CT of abdomen and sent to the ED for evaluation.

## 2017-01-15 NOTE — Consult Note (Addendum)
Medical Consultation   [name]  MHD:[id number]  DOB: 09-24-1975  DOA: 01/15/2017  PCP: Velna Hatchet, MD Outpatient Specialists:  Bariatric Surgery: Dr. Norris Cross.  Requesting physician: Modena Jansky, PA, General Surgery  Reason for consultation: Evaluation and management of sepsis secondary to intra-abdominal abscesses and for evaluation and management of multiple medical problems.  History of Present Illness: Albert White is a 41 year old married male with complex recent surgical history, status post laparoscopic Roux-en-Y/gastric bypass on 12/08/16 in [place] by Dr. Norris Cross, rehospitalized 12/30/16-01/10/17 for anasarca, abdominal pain, fever of 103F and postoperative seroma for which a percutaneous drain was placed, he was aggressively diuresed with IV Lasix and reportedly lost 20 pounds, had ongoing abdominal pain even while hospitalized, since discharge home 5 days ago, noted ongoing mostly lower abdominal moderate to severe pain, occasional nausea and nonbloody emesis, decreased appetite, no fever or chills, having normal BM, dyspnea on minimal exertion, orthopnea but no chest pain, mild dry cough. He lost additional 7 pounds weight on oral Lasix since discharge. He was seen in follow-up by his PCP yesterday who ordered labs and CT abdomen. CT abdomen showed gas containing 10.7 x 5 cm abscess abutting the posterior margin of the GJ anastomosis worrisome for dehiscence, diffuse peritonitis with large gas containing abscess throughout the peritoneal cavity including a right perihepatic abscess compressing the right liver lobe, ileus, small left and trace right dependent bilateral pleural effusions with moderate atelectasis. His PCP advised him to come to the ED straight from radiology. He has past medical history of type II DM diagnosed 1.5 years ago, HLD, OSA on nightly CPAP. He denies history of heart disease, strokes, asthma or COPD.  In the ED,  afebrile, sinus tachycardia, mild tachypnea, hemoglobin 10.3, platelets 578, normal creatinine, albumin 2.2. Patient has been evaluated by surgery. He is being admitted for early sepsis secondary to multiple intra-abdominal abscesses. Hospitalist service consulted to assist with management of same and with multiple medical problems.   Review of Systems:  ROS All other systems reviewed and apart from HPI, all others are negative.  Past Medical History: Past Medical History:  Diagnosis Date  . Abscess   . Diabetes mellitus without complication ([place])   . Hidradenitis suppurativa   . Hyperlipemia   . Morbid obesity ([place])   . Vertigo     Past Surgical History: Past Surgical History:  Procedure Laterality Date  . INCISION AND DRAINAGE ABSCESS Right 12/15/2013   Procedure: INCISION AND DRAINAGE Right Posterior neck ABSCESS;  Surgeon: Gwenyth Ober, MD;  Location: [facility];  Service: General;  Laterality: Right;  . INCISION AND DRAINAGE DEEP NECK ABSCESS     Hidradenitis - 3-4 times at [facility] and [place]     Allergies:  No Known Allergies   Social History:  reports that he quit smoking about 3 years ago. He smoked 1.00 pack per day. He has never used smokeless tobacco. He reports that he does not drink alcohol or use drugs.   Family History: Family History  Problem Relation Age of Onset  . Arthritis Mother   . Hypertension Mother       Physical Exam: Vitals:   01/15/17 1356 01/15/17 1541 01/15/17 1651  BP: 137/87 136/90 138/84  Pulse: (!) 122 (!) 118 (!) 117  Resp: 18 (!) 22 (!) 25  Temp: 98 F (36.7 C)    TempSrc: Oral    SpO2: 96%  92% 92%  Weight: (!) 155.6 kg (343 lb)    Height: '5\' 10"'  (1.778 m)      Constitutional: Pleasant young male, moderately built and morbidly obese, lying comfortably propped up in bed without obvious distress.  Eyes: PERLA, EOMI, irises appear normal, anicteric sclera,  ENMT: external ears and nose appear normal, hearing normal, Lips appears  normal, oropharynx mucosa, tongue, posterior pharynx appear normal . Dry oral mucosa.  Neck: neck appears normal, no masses, normal ROM, no thyromegaly, no JVD  CVS: S1-S2 clear, RRR, no murmur rubs or gallops, normal pedal pulses . 2+ pitting bilateral leg edema extending up to the anterior abdominal wall anteriorly and lower back posteriorly.  Respiratory:  reduced breath sounds in the bases but no wheezing, rhonchi or crackles appreciated. Rest of lung fields clear to auscultation. No increased work of breathing. Abdomen: Obese/distended, healed laparoscopic scars, diffuse moderate tenderness, guarding but no rigidity or rebound. No organomegaly or masses appreciated. Normal bowel sounds heard. Musculoskeletal: : no cyanosis, clubbing noted bilaterally. Joint/bones/muscle exam, strength, contractures or atrophy Neuro: Cranial nerves II-XII intact, strength, sensation, reflexes Psych: judgement and insight appear normal, stable mood and affect, mental status Skin: no rashes or lesions or ulcers, no induration or nodules    Data reviewed:  I have personally reviewed following labs and imaging studies Labs:  CBC:  Recent Labs Lab 01/15/17 1420  WBC 8.6  NEUTROABS 6.3  HGB 10.3*  HCT 33.1*  MCV 86.2  PLT 578*    Basic Metabolic Panel:  Recent Labs Lab 01/15/17 1420  NA 133*  K 3.9  CL 91*  CO2 26  GLUCOSE 121*  BUN 10  CREATININE 0.75  CALCIUM 8.8*   GFR Estimated Creatinine Clearance: 184 mL/min (by C-G formula based on SCr of 0.75 mg/dL). Liver Function Tests:  Recent Labs Lab 01/15/17 1420  AST 27  ALT 17  ALKPHOS 240*  BILITOT 1.1  PROT 7.0  ALBUMIN 2.2*   Coagulation profile  Recent Labs Lab 01/15/17 1420  INR 1.07    Urinalysis    Component Value Date/Time   COLORURINE YELLOW 01/15/2017 1624   APPEARANCEUR CLEAR 01/15/2017 1624   LABSPEC >1.046 (H) 01/15/2017 1624   PHURINE 5.0 01/15/2017 1624   GLUCOSEU NEGATIVE 01/15/2017 1624   HGBUR  NEGATIVE 01/15/2017 1624   BILIRUBINUR NEGATIVE 01/15/2017 1624   BILIRUBINUR neg 03/04/2012 2006   KETONESUR 20 (A) 01/15/2017 1624   PROTEINUR NEGATIVE 01/15/2017 1624   UROBILINOGEN 0.2 03/04/2012 2006   UROBILINOGEN 1.0 02/28/2012 1424   NITRITE NEGATIVE 01/15/2017 1624   LEUKOCYTESUR NEGATIVE 01/15/2017 1624     Inpatient Medications:   Scheduled Meds: . insulin aspart  2-6 Units Subcutaneous Q4H   Continuous Infusions: . sodium chloride    . acetaminophen    . famotidine (PEPCID) IV    . piperacillin-tazobactam (ZOSYN)  IV 3.375 g (01/15/17 1651)   EKG: Sinus tachycardia at 119 bpm, normal axis, nonspecific ST-T changes and no acute abnormalities. QTC 455 ms.  Radiological Exams on Admission: Dg Chest 2 View  Result Date: 01/15/2017 CLINICAL DATA:  Shortness of breath. EXAM: CHEST  2 VIEW COMPARISON:  Radiographs of March 04, 2012. FINDINGS: Stable cardiomediastinal silhouette. No pneumothorax is noted. Hypoinflation of the lungs is noted with mild bibasilar subsegmental atelectasis. Mild left pleural effusion is noted. Bony thorax is unremarkable. IMPRESSION: Hypoinflation of the lungs with mild bibasilar subsegmental atelectasis. Mild left pleural effusion is noted. Electronically Signed   By: Marijo Conception, M.D.  On: 01/15/2017 16:09   Ct Abdomen Pelvis W Contrast  Result Date: 01/15/2017 CLINICAL DATA:  Abdominopelvic pain. Status post gastric bypass surgery 11/28/2016. Elevated alkaline phosphatase. EXAM: CT ABDOMEN AND PELVIS WITH CONTRAST TECHNIQUE: Multidetector CT imaging of the abdomen and pelvis was performed using the standard protocol following bolus administration of intravenous contrast. CONTRAST:  114m ISOVUE-300 IOPAMIDOL (ISOVUE-300) INJECTION 61% COMPARISON:  04/16/2015 pelvic CT.  02/28/2012 CT abdomen/pelvis. FINDINGS: Lower chest: Small left and trace right dependent bilateral pleural effusions with moderate bibasilar atelectasis predominantly  involving the lower lobes and right middle lobe. Hepatobiliary: There is a large lentiform right perihepatic abscess measuring up to 20.0 x 11.2 cm with internal gas and with thick enhancing wall (series 3/image 17), which demonstrates prominent mass-effect on the right liver capsule. No liver mass. Normal gallbladder with no radiopaque cholelithiasis. No biliary ductal dilatation. Pancreas: Normal, with no mass or duct dilation. Spleen: There is a lentiform 12.1 x 2.4 cm thick walled fluid collection in the lateral perisplenic space (series 3/ image 26) with mass-effect on the lateral splenic capsule. Normal spleen size. No splenic mass. Adrenals/Urinary Tract: Normal adrenals. Nonobstructing 2 mm upper left renal stone. No hydronephrosis. Simple 1.8 cm renal cyst in the posterior lower left kidney. Otherwise normal kidneys, with no hydronephrosis. Normal bladder. Stomach/Bowel: Status post gastric bypass surgery. There is a gas containing abscess with thick enhancing wall measuring 10.7 x 5.0 cm abutting the posterior margin of the gastrojejunal anastomosis (series 3/ image 20). Smaller 4.3 x 2.5 cm fluid collection with thick enhancing wall along the greater curvature in the distal collapsed excluded stomach (series 3/image 32). No small bowel wall thickening. Mildly dilated mid small bowel loops with air-fluid levels, measuring up to 3.7 cm diameter. No discrete small bowel caliber transition. Appendix not discretely visualized. Mild sigmoid diverticulosis. No large bowel wall thickening. Vascular/Lymphatic: Atherosclerotic nonaneurysmal abdominal aorta. Patent hepatic, portal, splenic and renal veins. No pathologically enlarged lymph nodes in the abdomen or pelvis. Reproductive: Normal size prostate. Other: There is diffuse peritoneal thickening and hyperenhancement. There is a large loculated irregularly-shaped fluid collection conforming to the mid to lower peritoneal space measuring up to 27.8 x 19.6 cm  (series 3/ image 75), which is continuous with the right perihepatic space collection. Musculoskeletal: No aggressive appearing focal osseous lesions. Mild thoracolumbar spondylosis. IMPRESSION: 1. Gas containing 10.7 x 5.0 cm abscess abutting the posterior margin of the gastrojejunal anastomosis, worrisome for dehiscence of the gastrojejunal anastomosis. 2. Diffuse peritonitis with multiple large gas-containing abscesses throughout the peritoneal cavity, including a large right perihepatic abscess compressing the right liver lobe. 3. Mildly dilated mid small bowel loops with air-fluid levels, favor mild adynamic ileus. 4. Small left and trace right dependent bilateral pleural effusions with moderate bibasilar atelectasis. 5. Nonobstructing tiny left renal stone. 6.  Aortic Atherosclerosis (ICD10-I70.0). These results were called by telephone at the time of interpretation on 01/15/2017 at 12:41 pm to Dr. SVelna Hatchet, who verbally acknowledged these results. Electronically Signed   By: JIlona SorrelM.D.   On: 01/15/2017 12:42    Impression/Recommendations Active Problems:   Postprocedural intraabdominal abscess  1. Sepsis secondary to postop intra-abdominal abscesses and peritonitis: Complicating recent Roux-en-Y/gastric bypass 12/08/16 and drainage of seroma per cutaneously. Met sepsis criteria on admission. Management per general surgery: Nothing by mouth, IV fluids, IV antibiotics/Zosyn, pain management and plan to consult IR for percutaneous drainage. He may eventually need exploratory laparotomy.  2. Dyspnea: Multifactorial related to morbid obesity, OSA, atelectasis, small  bilateral pleural effusions and deconditioning. No overt pulmonary edema. Incentive spirometry. Oxygen supplements as needed. Nightly CPAP. Improve overall nutritional status. Once his initial sepsis is better controlled, start aggressive diuresis with IV Lasix. 3. Type II DM: Hold oral hypoglycemics. Place on sensitive NovoLog  SSI. Monitor closely and adjust as needed. 4. OSA: Nightly CPAP. 5. Anemia: Likely due to recent acute illness. No bleeding reported. Follow CBCs daily. Transfuse if hemoglobin <7 g per DL. 6.  Hyperlipidemia: Hold statins for now while he is nothing by mouth. 7. Nutrition: It appears that he will have a prolonged hospitalization due to multiple large intra-abdominal abscesses.? TPN >defer to general surgery. 8. Fluid overload/anasarca: Multifactorial due to hypoalbuminemia, volume resuscitation during recent hospitalization. He will obviously receive some more IV fluids for current sepsis physiology. Eventually we will need to start IV Lasix and improve on his overall nutritional status. 9. Morbid obesity/Body mass index is 49.22 kg/m.: At one time prior to surgery, patient weighed 396 pounds. He reports losing 60 pounds prior to surgery. Post surgery he lost additional 18 pounds but then regained during recent hospitalization.    Thank you for this consultation.  We will follow the patient along with you.   Time Spent: 61 minutes  Jalynne Persico M.D. [facility] 01/15/2017, 6:46 PM

## 2017-01-15 NOTE — ED Provider Notes (Signed)
[facility]-EMERGENCY DEPT Provider Note   CSN: [id number] Arrival date & time: 01/15/17  1325     History   Chief Complaint Chief Complaint  Patient presents with  . Abdominal Pain    HPI YANKY VANDERBURG is a 41 y.o. male with h/o morbid obesity, hyperlipidemia, OSA on CPAP, T2DM, former smoker, and recent laparoscopic Roux-en-Y procedure c/b postoperative seroma that required drainage and pelvic ascites presents to ED from PCP after CT a/p was noted to be abnormal. Wife assists with HPI. States pt has had a very difficulty post-op, has had lower abdominal, nausea, decreased PO intake, scrotal edema and LE edema since surgery. PCP advised pt to come to [facility] ED for further evaluation. No fevers, chills, sweats, diarrhea, dysuria.  PCP: Dr. Alysia Penna Surgeon: Dr. Franki Cabot [facility]  HPI  Past Medical History:  Diagnosis Date  . Abscess   . Diabetes mellitus without complication (HCC)   . Hidradenitis suppurativa   . Hyperlipemia   . Morbid obesity (HCC)   . Vertigo     Patient Active Problem List   Diagnosis Date Noted  . DM (diabetes mellitus) (HCC) 02/29/2016  . HLD (hyperlipidemia) 02/29/2016  . Cellulitis and abscess of neck 02/29/2016  . Cellulitis 12/14/2013  . Morbid obesity (HCC) 12/14/2013  . Hidradenitis suppurativa, neck and scalp 08/09/2012    Past Surgical History:  Procedure Laterality Date  . INCISION AND DRAINAGE ABSCESS Right 12/15/2013   Procedure: INCISION AND DRAINAGE Right Posterior neck ABSCESS;  Surgeon: Cherylynn Ridges, MD;  Location: [facility];  Service: General;  Laterality: Right;  . INCISION AND DRAINAGE DEEP NECK ABSCESS     Hidradenitis - 3-4 times at [facility] and [place]       Home Medications    Prior to Admission medications   Medication Sig Start Date End Date Taking? Authorizing Provider  acetaminophen (TYLENOL) 325 MG tablet Take 2 tablets (650 mg total) by mouth every 8 (eight) hours. Patient taking differently: Take  650 mg by mouth every 8 (eight) hours as needed for moderate pain or headache.  02/27/16   Mesner, Barbara Cower, MD  atorvastatin (LIPITOR) 10 MG tablet Take 10 mg by mouth every evening.  09/18/15   [provider]  cephALEXin (KEFLEX) 500 MG capsule Take 1 capsule (500 mg total) by mouth 3 (three) times daily. 03/05/16   Ghimire, Werner Lean, MD  doxycycline (VIBRA-TABS) 100 MG tablet Take 1 tablet (100 mg total) by mouth 2 (two) times daily. 03/05/16   Ghimire, Werner Lean, MD  metFORMIN (GLUCOPHAGE) 500 MG tablet Take 2 tablets (1,000 mg total) by mouth 2 (two) times daily with a meal. 03/05/16   Ghimire, Werner Lean, MD  OVER THE COUNTER MEDICATION Apply 1 application topically 2 (two) times daily. [employer] Eczema Cream    [provider]  oxyCODONE (ROXICODONE) 5 MG immediate release tablet Take 1 tablet (5 mg total) by mouth every 6 (six) hours as needed for severe pain. 03/05/16   Ghimire, Werner Lean, MD  traZODone (DESYREL) 100 MG tablet Take 100 mg by mouth at bedtime.    [provider]    Family History Family History  Problem Relation Age of Onset  . Arthritis Mother   . Hypertension Mother     Social History Social History  Substance Use Topics  . Smoking status: Former Smoker    Packs/day: 1.00    Quit date: 11/27/2013  . Smokeless tobacco: Never Used  . Alcohol use No  Allergies   Patient has no known allergies.   Review of Systems Review of Systems  Constitutional: Positive for appetite change. Negative for chills, diaphoresis and fever.  Respiratory: Negative for shortness of breath.   Cardiovascular: Negative for chest pain.  Gastrointestinal: Positive for abdominal distention, abdominal pain, nausea and vomiting.  Skin: Positive for color change.     Physical Exam Updated Vital Signs BP 136/90   Pulse (!) 118   Temp 98 F (36.7 C) (Oral)   Resp (!) 22   Ht 5\' 10"  (1.778 m)   Wt (!) 155.6 kg (343 lb)   SpO2 92%   BMI 49.22 kg/m    Physical Exam  Constitutional: He is oriented to person, place, and time. He appears well-developed and well-nourished. No distress.  HENT:  Head: Normocephalic and atraumatic.  Nose: Nose normal.  Mouth/Throat: No oropharyngeal exudate.  Dry mucous membranes  Eyes: Pupils are equal, round, and reactive to light. Conjunctivae and EOM are normal.  Neck: Normal range of motion. Neck supple.  Cardiovascular: Regular rhythm, S1 normal, S2 normal, normal heart sounds and intact distal pulses.  Tachycardia present.   No murmur heard. Pulmonary/Chest: Effort normal. No respiratory distress. He has decreased breath sounds. He has no wheezes. He has no rales.  Decreased breath sounds in lower lobes, body habitus makes exam difficult  Abdominal: Soft. Bowel sounds are normal. He exhibits distension. There is tenderness. There is rebound and guarding.  +Peritoneal signs Slight erythema below umbilicus   Musculoskeletal: Normal range of motion. He exhibits no deformity.  Lymphadenopathy:    He has no cervical adenopathy.  Neurological: He is alert and oriented to person, place, and time.  Skin: Skin is warm and dry. Capillary refill takes less than 2 seconds.  Psychiatric: He has a normal mood and affect. His behavior is normal. Judgment and thought content normal.  Nursing note and vitals reviewed.    ED Treatments / Results  Labs (all labs ordered are listed, but only abnormal results are displayed) Labs Reviewed  COMPREHENSIVE METABOLIC PANEL - Abnormal; Notable for the following:       Result Value   Sodium 133 (*)    Chloride 91 (*)    Glucose, Bld 121 (*)    Calcium 8.8 (*)    Albumin 2.2 (*)    Alkaline Phosphatase 240 (*)    Anion gap 16 (*)    All other components within normal limits  CBC WITH DIFFERENTIAL/PLATELET - Abnormal; Notable for the following:    RBC 3.84 (*)    Hemoglobin 10.3 (*)    HCT 33.1 (*)    RDW 16.6 (*)    Platelets 578 (*)    Monocytes Absolute 1.1  (*)    All other components within normal limits  CULTURE, BLOOD (ROUTINE X 2)  CULTURE, BLOOD (ROUTINE X 2)  PROTIME-INR  URINALYSIS, ROUTINE W REFLEX MICROSCOPIC  BRAIN NATRIURETIC PEPTIDE  I-STAT CG4 LACTIC ACID, ED    EKG  EKG Interpretation  Date/Time:  Thursday January 15 2017 15:38:56 EDT Ventricular Rate:  119 PR Interval:    QRS Duration: 99 QT Interval:  323 QTC Calculation: 455 R Axis:   137 Text Interpretation:  Right and left arm electrode reversal, interpretation assumes no reversal Sinus or ectopic atrial tachycardia Probable inferior infarct, age indeterminate Lateral leads are also involved No STEMI.  Confirmed by Alona Bene [phone]) on 01/15/2017 3:41:16 PM       Radiology Ct Abdomen Pelvis W Contrast  Result Date: 01/15/2017 CLINICAL DATA:  Abdominopelvic pain. Status post gastric bypass surgery 11/28/2016. Elevated alkaline phosphatase. EXAM: CT ABDOMEN AND PELVIS WITH CONTRAST TECHNIQUE: Multidetector CT imaging of the abdomen and pelvis was performed using the standard protocol following bolus administration of intravenous contrast. CONTRAST:  ISOVUE-300 IOPAMIDOL (ISOVUE-300) INJECTION 61% COMPARISON:  04/16/2015 pelvic CT.  02/28/2012 CT abdomen/pelvis. FINDINGS: Lower chest: Small left and trace right dependent bilateral pleural effusions with moderate bibasilar atelectasis predominantly involving the lower lobes and right middle lobe. Hepatobiliary: There is a large lentiform right perihepatic abscess measuring up to 20.0 x 11.2 cm with internal gas and with thick enhancing wall (series 3/image 17), which demonstrates prominent mass-effect on the right liver capsule. No liver mass. Normal gallbladder with no radiopaque cholelithiasis. No biliary ductal dilatation. Pancreas: Normal, with no mass or duct dilation. Spleen: There is a lentiform 12.1 x 2.4 cm thick walled fluid collection in the lateral perisplenic space (series 3/ image 26) with mass-effect on  the lateral splenic capsule. Normal spleen size. No splenic mass. Adrenals/Urinary Tract: Normal adrenals. Nonobstructing 2 mm upper left renal stone. No hydronephrosis. Simple 1.8 cm renal cyst in the posterior lower left kidney. Otherwise normal kidneys, with no hydronephrosis. Normal bladder. Stomach/Bowel: Status post gastric bypass surgery. There is a gas containing abscess with thick enhancing wall measuring 10.7 x 5.0 cm abutting the posterior margin of the gastrojejunal anastomosis (series 3/ image 20). Smaller 4.3 x 2.5 cm fluid collection with thick enhancing wall along the greater curvature in the distal collapsed excluded stomach (series 3/image 32). No small bowel wall thickening. Mildly dilated mid small bowel loops with air-fluid levels, measuring up to 3.7 cm diameter. No discrete small bowel caliber transition. Appendix not discretely visualized. Mild sigmoid diverticulosis. No large bowel wall thickening. Vascular/Lymphatic: Atherosclerotic nonaneurysmal abdominal aorta. Patent hepatic, portal, splenic and renal veins. No pathologically enlarged lymph nodes in the abdomen or pelvis. Reproductive: Normal size prostate. Other: There is diffuse peritoneal thickening and hyperenhancement. There is a large loculated irregularly-shaped fluid collection conforming to the mid to lower peritoneal space measuring up to 27.8 x 19.6 cm (series 3/ image 75), which is continuous with the right perihepatic space collection. Musculoskeletal: No aggressive appearing focal osseous lesions. Mild thoracolumbar spondylosis. IMPRESSION: 1. Gas containing 10.7 x 5.0 cm abscess abutting the posterior margin of the gastrojejunal anastomosis, worrisome for dehiscence of the gastrojejunal anastomosis. 2. Diffuse peritonitis with multiple large gas-containing abscesses throughout the peritoneal cavity, including a large right perihepatic abscess compressing the right liver lobe. 3. Mildly dilated mid small bowel loops with  air-fluid levels, favor mild adynamic ileus. 4. Small left and trace right dependent bilateral pleural effusions with moderate bibasilar atelectasis. 5. Nonobstructing tiny left renal stone. 6.  Aortic Atherosclerosis (ICD10-I70.0). These results were called by telephone at the time of interpretation on 01/15/2017 at 12:41 pm to Dr. Alysia Penna , who verbally acknowledged these results. Electronically Signed   By: Delbert Phenix M.D.   On: 01/15/2017 12:42    Procedures Procedures (including critical care time)  Medications Ordered in ED Medications  HYDROmorphone (DILAUDID) injection 1 mg (not administered)  sodium chloride 0.9 % bolus 1,000 mL (0 mLs Intravenous Stopped 01/15/17 1601)  ondansetron (ZOFRAN) injection 4 mg (4 mg Intravenous Given 01/15/17 1536)  morphine 4 MG/ML injection 4 mg (4 mg Intravenous Given 01/15/17 1536)     Initial Impression / Assessment and Plan / ED Course  I have reviewed the triage vital signs and the nursing  notes.  Pertinent labs & imaging results that were available during my care of the patient were reviewed by me and considered in my medical decision making (see chart for details).  Clinical Course as of Jan 15 1606  Thu Jan 15, 2017  1512 IMPRESSION: 1. Gas containing 10.7 x 5.0 cm abscess abutting the posterior margin of the gastrojejunal anastomosis, worrisome for dehiscence of the gastrojejunal anastomosis. 2. Diffuse peritonitis with multiple large gas-containing abscesses throughout the peritoneal cavity, including a large right perihepatic abscess compressing the right liver lobe. 3. Mildly dilated mid small bowel loops with air-fluid levels, favor mild adynamic ileus. 4. Small left and trace right dependent bilateral pleural effusions with moderate bibasilar atelectasis. 5. Nonobstructing tiny left renal stone. 6.  Aortic Atherosclerosis (ICD10-I70.0).  [CG]  1549 Spoke to Dr Lovell SheehanJenkins who will see pt  [CG]  1553 Albumin: (!) 2.2 [CG]    1554 Alkaline Phosphatase: (!) 240 [CG]  1554 WBC: 8.6 [CG]  1554 Hemoglobin: (!) 10.3 [CG]  1554 Lactic Acid, Venous: 1.65 [CG]    Clinical Course User Index [CG] Liberty HandyGibbons, Carri Spillers J, PA-C   41 yo male with pmh significant for morbid obesity, hyperlipidemia, OSA, T2DM, former smoker, and recent laparoscopic Roux-en-Y procedure c/b seroma and pelvis ascites sent to ED from PCP office after concerning outpatient CT a/p done yesterday. He reports ongoing n/v, abdominal pain ever since surgery and 40# weight increase that he attributes to "fluid".   Concerning CT A/P showing multiple very large abscesses in the peritoneal cavity with the largest compressing the right liver lobe. Worrisome for dehiscence of anastomosis. Clinically, patient has signs of peritonitis. He is hemodynamically stable but with tachycardia in the 120s. Mucous membranes are dry. We'll consult general surgery.  Final Clinical Impressions(s) / ED Diagnoses   ED lab work remarkable for elevated alkaline phosphatase, low albumin and anemia. No leukocytosis and normal lactic acid. Pending BNP, chest x-ray, blood cultures, urinalysis. Patient has been given 1 L IV fluids, Zofran, morphine, Dilaudid 1, zosyn per pharmacy in the ED. Spoke to Dr. Lovell SheehanJenkins with Gen. surgery who will evaluate patient in NAD. Discussed plan with pt and wife at bedside and they are agreeable.   Patient, ED treatment and discharge plan was discussed with supervising physician who also evaluated the patient and is agreeable with plan.   Final diagnoses:  Peritonitis [facility](HCC)    New Prescriptions New Prescriptions   No medications on file     Jerrell MylarGibbons, Emoree Sasaki J, PA-C 01/15/17 1607    Little, Ambrose Finlandachel Morgan, MD 01/19/17 [phone]

## 2017-01-15 NOTE — Progress Notes (Signed)
Patient has home cpap, wife is going home to obtain. RT will continue to monitor.

## 2017-01-15 NOTE — ED Notes (Signed)
Spoke with Dr Clarene DukeLittle regarding patient and orders.

## 2017-01-15 NOTE — ED Notes (Signed)
Attempted to call report

## 2017-01-15 NOTE — ED Notes (Signed)
Attempted report 

## 2017-01-15 NOTE — ED Notes (Signed)
Pt and SO informed of change in bed status. SO not happy but I assured them that we were working on a new assignment and would continue care throughout.

## 2017-01-15 NOTE — ED Notes (Signed)
Got patient undress on the monitor into a gown patient is resting with family at bedside and call bell in reach

## 2017-01-15 NOTE — Consult Note (Signed)
Reason for Consult:multiple intraabdominal abscesses/possible dehiscen CC:  Abdominal pain Referring Physician: Maryruth Bun   PCP:  Velna Hatchet, MD   Albert White is an 41 y.o. male.  HPI: Pt is s/p laparoscopic Roux-en-Y/gastric bypass on 12/08/2016 [facility] by Dr. Norris Cross. He was seen in follow-up on 12/12/2016 with swelling and scrotal pain.  Workup showed H/H = 7.6/22.5, ultrasound showed edema with left hydrocele and varicocele. He was also found to have abdominal wall hematoma during this encounter. He was readmitted on 12/30/16 with a diagnosis of generalized edema, abdominal pain, fever up to 103, fluid overload, and post operative seroma. He underwent drainage of the seroma during this admission.  Discharged on 01/10/17 with over 20 lb fluid diuresis during that admit. He was told the abdominal pain was normal.  His wife reports a CT scan before discharge, but no report or an abscess at that time.    He was seen by his PCP yesterday for follow-up of his fluid overload. He reports his lost about 7 more pounds since discharge on 01/10/17, on oral Lasix. He's been able to keep down some protein shakes and a little bit of chicken. Bowel movements have been essentially normal. After examination by Dr. Ardeth Perfect, he was sent for CT scan evaluation he was subsequently called and sent to the ED after results of the CT were available.   Workup here shows he is afebrile tachycardic blood pressure is stable.. 6 height is 510 his weight is 155.6 kg, BMI is sodium is 133, glucose 121 alkaline phosphatase elevated 240 WBC is 8.6, hemoglobin is 10.3, hematocrit is 33.1, platelets 578,000. CT scan: Gas-containing 10.7 by 5 cm abscess abutting the posterior margin of the gastrojejunal anastomosis worrisome for dehiscence. Diffuse peritonitis with large gas-containing abscess throughout the peritoneal cavity including a right perihepatic abscess compressing the right liver lobe. Mild dilated small  bowel with air-fluid collections favoring ileus. Small left and trace right dependent bilateral pleural effusions with moderate atelectasis.  We are asked to see.  Past Medical History:  Diagnosis Date  . Abscess   . Diabetes mellitus without complication ([place]) History tobacco use    . Hidradenitis suppurativa   . Hyperlipemia   . Morbid obesity ([place])   . Vertigo     Past Surgical History:  Procedure Laterality Date  . INCISION AND DRAINAGE ABSCESS Right 12/15/2013   Procedure: INCISION AND DRAINAGE Right Posterior neck ABSCESS;  Surgeon: Gwenyth Ober, MD;  Location: [facility];  Service: General;  Laterality: Right;  . INCISION AND DRAINAGE DEEP NECK ABSCESS     Hidradenitis - 3-4 times at [facility] and [place]    Family History  Problem Relation Age of Onset  . Arthritis Mother   . Hypertension Mother     Social History:  reports that he quit smoking about 3 years ago. He smoked 1.00 pack per day. He has never used smokeless tobacco. He reports that he does not drink alcohol or use drugs.  Allergies: No Known Allergies  Prior to Admission medications   Medication Sig Start Date End Date Taking? Authorizing Provider  acetaminophen (TYLENOL) 325 MG tablet Take 2 tablets (650 mg total) by mouth every 8 (eight) hours. Patient taking differently: Take 650 mg by mouth every 8 (eight) hours as needed for moderate pain or headache.  02/27/16  Yes Mesner, Corene Cornea, MD  atorvastatin (LIPITOR) 10 MG tablet Take 10 mg by mouth every evening.  09/18/15  Yes [provider]  calcium carbonate (TUMS -  DOSED IN MG ELEMENTAL CALCIUM) 500 MG chewable tablet Chew 500 mg by mouth as needed for heartburn.   Yes [provider]  nystatin (MYCOSTATIN) 100000 UNIT/ML suspension Take by mouth as needed. Thrush. 1 tsp 11/27/16  Yes [provider]  omeprazole (PRILOSEC) 20 MG capsule Take 20 mg by mouth 2 (two) times daily. 11/27/16  Yes [provider]  OVER THE COUNTER MEDICATION  Apply 1 application topically as needed (eczema). [employer] Eczema Cream    Yes [provider]  oxyCODONE (ROXICODONE) 5 MG immediate release tablet Take 1 tablet (5 mg total) by mouth every 6 (six) hours as needed for severe pain. 03/05/16  Yes Ghimire, Henreitta Leber, MD  traZODone (DESYREL) 100 MG tablet Take 100 mg by mouth at bedtime.   Yes [provider]  ursodiol (ACTIGALL) 300 MG capsule Take 300 mg by mouth 2 (two) times daily. 11/27/16  Yes [provider]  cephALEXin (KEFLEX) 500 MG capsule Take 1 capsule (500 mg total) by mouth 3 (three) times daily. Patient not taking: Reported on 01/15/2017 03/05/16   Jonetta Osgood, MD  doxycycline (VIBRA-TABS) 100 MG tablet Take 1 tablet (100 mg total) by mouth 2 (two) times daily. Patient not taking: Reported on 01/15/2017 03/05/16   Jonetta Osgood, MD  metFORMIN (GLUCOPHAGE) 500 MG tablet Take 2 tablets (1,000 mg total) by mouth 2 (two) times daily with a meal. Patient not taking: Reported on 01/15/2017 03/05/16   Jonetta Osgood, MD     Results for orders placed or performed during the hospital encounter of 01/15/17 (from the past 48 hour(s))  Comprehensive metabolic panel     Status: Abnormal   Collection Time: 01/15/17  2:20 PM  Result Value Ref Range   Sodium 133 (L) 135 - 145 mmol/L   Potassium 3.9 3.5 - 5.1 mmol/L   Chloride 91 (L) 101 - 111 mmol/L   CO2 26 22 - 32 mmol/L   Glucose, Bld 121 (H) 65 - 99 mg/dL   BUN 10 6 - 20 mg/dL   Creatinine, Ser 0.75 0.61 - 1.24 mg/dL   Calcium 8.8 (L) 8.9 - 10.3 mg/dL   Total Protein 7.0 6.5 - 8.1 g/dL   Albumin 2.2 (L) 3.5 - 5.0 g/dL   AST 27 15 - 41 U/L   ALT 17 17 - 63 U/L   Alkaline Phosphatase 240 (H) 38 - 126 U/L   Total Bilirubin 1.1 0.3 - 1.2 mg/dL   GFR calc non Af Amer >60 >60 mL/min   GFR calc Af Amer >60 >60 mL/min    Comment: (NOTE) The eGFR has been calculated using the CKD EPI equation. This calculation has not been validated in all clinical  situations. eGFR's persistently <60 mL/min signify possible Chronic Kidney Disease.    Anion gap 16 (H) 5 - 15  CBC with Differential     Status: Abnormal   Collection Time: 01/15/17  2:20 PM  Result Value Ref Range   WBC 8.6 4.0 - 10.5 K/uL   RBC 3.84 (L) 4.22 - 5.81 MIL/uL   Hemoglobin 10.3 (L) 13.0 - 17.0 g/dL   HCT 33.1 (L) 39.0 - 52.0 %   MCV 86.2 78.0 - 100.0 fL   MCH 26.8 26.0 - 34.0 pg   MCHC 31.1 30.0 - 36.0 g/dL   RDW 16.6 (H) 11.5 - 15.5 %   Platelets 578 (H) 150 - 400 K/uL   Neutrophils Relative % 74 %   Neutro Abs 6.3 1.7 -  7.7 K/uL   Lymphocytes Relative 14 %   Lymphs Abs 1.2 0.7 - 4.0 K/uL   Monocytes Relative 12 %   Monocytes Absolute 1.1 (H) 0.1 - 1.0 K/uL   Eosinophils Relative 0 %   Eosinophils Absolute 0.0 0.0 - 0.7 K/uL   Basophils Relative 0 %   Basophils Absolute 0.0 0.0 - 0.1 K/uL  Protime-INR     Status: None   Collection Time: 01/15/17  2:20 PM  Result Value Ref Range   Prothrombin Time 13.8 11.4 - 15.2 seconds   INR 1.07   I-Stat CG4 Lactic Acid, ED     Status: None   Collection Time: 01/15/17  2:45 PM  Result Value Ref Range   Lactic Acid, Venous 1.65 0.5 - 1.9 mmol/L    Dg Chest 2 View  Result Date: 01/15/2017 CLINICAL DATA:  Shortness of breath. EXAM: CHEST  2 VIEW COMPARISON:  Radiographs of March 04, 2012. FINDINGS: Stable cardiomediastinal silhouette. No pneumothorax is noted. Hypoinflation of the lungs is noted with mild bibasilar subsegmental atelectasis. Mild left pleural effusion is noted. Bony thorax is unremarkable. IMPRESSION: Hypoinflation of the lungs with mild bibasilar subsegmental atelectasis. Mild left pleural effusion is noted. Electronically Signed   By: Marijo Conception, M.D.   On: 01/15/2017 16:09   Ct Abdomen Pelvis W Contrast  Result Date: 01/15/2017 CLINICAL DATA:  Abdominopelvic pain. Status post gastric bypass surgery 11/28/2016. Elevated alkaline phosphatase. EXAM: CT ABDOMEN AND PELVIS WITH CONTRAST TECHNIQUE:  Multidetector CT imaging of the abdomen and pelvis was performed using the standard protocol following bolus administration of intravenous contrast. CONTRAST:  160m ISOVUE-300 IOPAMIDOL (ISOVUE-300) INJECTION 61% COMPARISON:  04/16/2015 pelvic CT.  02/28/2012 CT abdomen/pelvis. FINDINGS: Lower chest: Small left and trace right dependent bilateral pleural effusions with moderate bibasilar atelectasis predominantly involving the lower lobes and right middle lobe. Hepatobiliary: There is a large lentiform right perihepatic abscess measuring up to 20.0 x 11.2 cm with internal gas and with thick enhancing wall (series 3/image 17), which demonstrates prominent mass-effect on the right liver capsule. No liver mass. Normal gallbladder with no radiopaque cholelithiasis. No biliary ductal dilatation. Pancreas: Normal, with no mass or duct dilation. Spleen: There is a lentiform 12.1 x 2.4 cm thick walled fluid collection in the lateral perisplenic space (series 3/ image 26) with mass-effect on the lateral splenic capsule. Normal spleen size. No splenic mass. Adrenals/Urinary Tract: Normal adrenals. Nonobstructing 2 mm upper left renal stone. No hydronephrosis. Simple 1.8 cm renal cyst in the posterior lower left kidney. Otherwise normal kidneys, with no hydronephrosis. Normal bladder. Stomach/Bowel: Status post gastric bypass surgery. There is a gas containing abscess with thick enhancing wall measuring 10.7 x 5.0 cm abutting the posterior margin of the gastrojejunal anastomosis (series 3/ image 20). Smaller 4.3 x 2.5 cm fluid collection with thick enhancing wall along the greater curvature in the distal collapsed excluded stomach (series 3/image 32). No small bowel wall thickening. Mildly dilated mid small bowel loops with air-fluid levels, measuring up to 3.7 cm diameter. No discrete small bowel caliber transition. Appendix not discretely visualized. Mild sigmoid diverticulosis. No large bowel wall thickening.  Vascular/Lymphatic: Atherosclerotic nonaneurysmal abdominal aorta. Patent hepatic, portal, splenic and renal veins. No pathologically enlarged lymph nodes in the abdomen or pelvis. Reproductive: Normal size prostate. Other: There is diffuse peritoneal thickening and hyperenhancement. There is a large loculated irregularly-shaped fluid collection conforming to the mid to lower peritoneal space measuring up to 27.8 x 19.6 cm (series 3/ image 75), which  is continuous with the right perihepatic space collection. Musculoskeletal: No aggressive appearing focal osseous lesions. Mild thoracolumbar spondylosis. IMPRESSION: 1. Gas containing 10.7 x 5.0 cm abscess abutting the posterior margin of the gastrojejunal anastomosis, worrisome for dehiscence of the gastrojejunal anastomosis. 2. Diffuse peritonitis with multiple large gas-containing abscesses throughout the peritoneal cavity, including a large right perihepatic abscess compressing the right liver lobe. 3. Mildly dilated mid small bowel loops with air-fluid levels, favor mild adynamic ileus. 4. Small left and trace right dependent bilateral pleural effusions with moderate bibasilar atelectasis. 5. Nonobstructing tiny left renal stone. 6.  Aortic Atherosclerosis (ICD10-I70.0). These results were called by telephone at the time of interpretation on 01/15/2017 at 12:41 pm to Dr. Velna Hatchet , who verbally acknowledged these results. Electronically Signed   By: Ilona Sorrel M.D.   On: 01/15/2017 12:42    Review of Systems  Constitutional: Positive for malaise/fatigue and weight loss. Negative for chills and fever.  HENT: Negative.   Eyes: Negative.   Respiratory: Negative for cough, hemoptysis, sputum production, shortness of breath and wheezing.        Has not been able to lie down since he was in hospital 8/14-8/25/18.  Cardiovascular: Positive for orthopnea and leg swelling. Negative for claudication and PND.       He has some mild erythema lower abdomen.   Gastrointestinal: Positive for abdominal pain and nausea. Negative for constipation, diarrhea, heartburn and vomiting.  Genitourinary: Negative.   Musculoskeletal: Positive for myalgias.  Skin:       Mild erythema lower abdomen  Neurological: Negative.   Endo/Heme/Allergies: Negative for environmental allergies and polydipsia. Does not bruise/bleed easily.  Psychiatric/Behavioral: Negative.    Blood pressure 136/90, pulse (!) 118, temperature 98 F (36.7 C), temperature source Oral, resp. rate (!) 22, height '5\' 10"'  (1.778 m), weight (!) 155.6 kg (343 lb), SpO2 92 %. Physical Exam  Constitutional: He is oriented to person, place, and time. No distress.  Morbidly obese male, tachycardic, chronic lower abdominal pain since surgery.   Hepatitis C 7.7 heart rate 125, blood pressure 126/103  HENT:  Head: Normocephalic and atraumatic.  Mouth/Throat: No oropharyngeal exudate.  Eyes: Right eye exhibits no discharge. Left eye exhibits no discharge. No scleral icterus.  Pupils are equal  Neck: Normal range of motion. Neck supple. No JVD present. No tracheal deviation present. No thyromegaly present.  Cardiovascular: Regular rhythm, normal heart sounds and intact distal pulses.   No murmur heard. Tachycardic  Respiratory: Effort normal and breath sounds normal. No respiratory distress. He has no wheezes. He has no rales. He exhibits no tenderness.  GI: He exhibits distension. There is tenderness. There is rebound.  He is most painful in his lower abdomen and this has been ongoing.  Musculoskeletal: He exhibits edema (+2-3 lower extremity edema). He exhibits no tenderness.  Lymphadenopathy:    He has no cervical adenopathy.  Neurological: He is alert and oriented to person, place, and time. No cranial nerve deficit.  Skin: Skin is warm and dry. No rash noted. He is not diaphoretic. There is erythema (lower abdome is a little erythematous).  Psychiatric: He has a normal mood and affect. His  behavior is normal. Judgment and thought content normal.    Assessment/Plan: Multiple intra-abdominal abscesses Early Sepsis Status post Roux-en-Y/gastric bypass, 12/08/16, Dr. Volanda Napoleon Fluid overload/ seroma with drainage 8/14-8/25/18 Erline Hau Obstructive sleep apnea with CPAP Morbid obesity BMI 49 Type 2 diabetes Hyperlipidemia  Plan:  I think he has early sepsis from the  intraabdominal abscesses.  We plan to try IR drains right now and see how he does.  I have ask Medicine to see and help with his medical management.  I plan to put in SDU with telemetry.    Albert White 01/15/2017, 4:18 PM

## 2017-01-16 ENCOUNTER — Inpatient Hospital Stay (HOSPITAL_COMMUNITY): Payer: 59

## 2017-01-16 DIAGNOSIS — E119 Type 2 diabetes mellitus without complications: Secondary | ICD-10-CM

## 2017-01-16 LAB — CBC
HEMATOCRIT: 30.4 % — AB (ref 39.0–52.0)
Hemoglobin: 9.5 g/dL — ABNORMAL LOW (ref 13.0–17.0)
MCH: 27.1 pg (ref 26.0–34.0)
MCHC: 31.3 g/dL (ref 30.0–36.0)
MCV: 86.6 fL (ref 78.0–100.0)
Platelets: 514 10*3/uL — ABNORMAL HIGH (ref 150–400)
RBC: 3.51 MIL/uL — ABNORMAL LOW (ref 4.22–5.81)
RDW: 16.4 % — AB (ref 11.5–15.5)
WBC: 6.7 10*3/uL (ref 4.0–10.5)

## 2017-01-16 LAB — GLUCOSE, CAPILLARY
GLUCOSE-CAPILLARY: 109 mg/dL — AB (ref 65–99)
GLUCOSE-CAPILLARY: 123 mg/dL — AB (ref 65–99)
GLUCOSE-CAPILLARY: 124 mg/dL — AB (ref 65–99)
GLUCOSE-CAPILLARY: 132 mg/dL — AB (ref 65–99)
Glucose-Capillary: 121 mg/dL — ABNORMAL HIGH (ref 65–99)
Glucose-Capillary: 116 mg/dL — ABNORMAL HIGH (ref 65–99)
Glucose-Capillary: 134 mg/dL — ABNORMAL HIGH (ref 65–99)

## 2017-01-16 LAB — BASIC METABOLIC PANEL
ANION GAP: 12 (ref 5–15)
BUN: 9 mg/dL (ref 6–20)
CO2: 26 mmol/L (ref 22–32)
Calcium: 8.4 mg/dL — ABNORMAL LOW (ref 8.9–10.3)
Chloride: 96 mmol/L — ABNORMAL LOW (ref 101–111)
Creatinine, Ser: 0.87 mg/dL (ref 0.61–1.24)
GFR calc Af Amer: >60 mL/min (ref >60)
GFR calc non Af Amer: >60 mL/min (ref >60)
GLUCOSE: 121 mg/dL — AB (ref 65–99)
POTASSIUM: 3.9 mmol/L (ref 3.5–5.1)
Sodium: 134 mmol/L — ABNORMAL LOW (ref 135–145)

## 2017-01-16 LAB — HIV ANTIBODY (ROUTINE TESTING W REFLEX): HIV Screen 4th Generation wRfx: NONREACTIVE

## 2017-01-16 LAB — TYPE AND SCREEN
ABO/RH(D): O POS
ANTIBODY SCREEN: NEGATIVE

## 2017-01-16 LAB — ABO/RH: ABO/RH(D): O POS

## 2017-01-16 LAB — MAGNESIUM: Magnesium: 1.8 mg/dL (ref 1.7–2.4)

## 2017-01-16 LAB — MRSA PCR SCREENING: MRSA by PCR: NEGATIVE

## 2017-01-16 LAB — PHOSPHORUS: Phosphorus: 4.9 mg/dL — ABNORMAL HIGH (ref 2.5–4.6)

## 2017-01-16 MED ORDER — LIDOCAINE HCL (PF) 1 % IJ SOLN
INTRAMUSCULAR | Status: AC
  Filled 2017-01-16: qty 30

## 2017-01-16 MED ORDER — SODIUM CHLORIDE 0.9% FLUSH
10.0000 mL | Freq: Two times a day (BID) | INTRAVENOUS | Status: DC
Start: 2017-01-16 — End: 2017-01-19
  Administered 2017-01-16 – 2017-01-18 (×5): 10 mL

## 2017-01-16 MED ORDER — CHLORHEXIDINE GLUCONATE CLOTH 2 % EX PADS
6.0000 | MEDICATED_PAD | Freq: Every day | CUTANEOUS | Status: DC
  Administered 2017-01-16 – 2017-02-02 (×16): 6 via TOPICAL

## 2017-01-16 MED ORDER — SODIUM CHLORIDE 0.9% FLUSH
5.0000 mL | Freq: Three times a day (TID) | INTRAVENOUS | Status: DC
  Administered 2017-01-16 – 2017-02-03 (×38): 5 mL via INTRAVENOUS

## 2017-01-16 MED ORDER — HYDROMORPHONE HCL 1 MG/ML IJ SOLN
INTRAMUSCULAR | Status: AC
  Administered 2017-01-16: 1 mg via INTRAVENOUS
  Filled 2017-01-16: qty 1

## 2017-01-16 MED ORDER — ENOXAPARIN SODIUM 40 MG/0.4ML ~~LOC~~ SOLN
40.0000 mg | Freq: Two times a day (BID) | SUBCUTANEOUS | Status: DC
  Administered 2017-01-16 – 2017-01-19 (×6): 40 mg via SUBCUTANEOUS
  Filled 2017-01-16 (×6): qty 0.4

## 2017-01-16 MED ORDER — MIDAZOLAM HCL 2 MG/2ML IJ SOLN
INTRAMUSCULAR | Status: AC
  Filled 2017-01-16: qty 6

## 2017-01-16 MED ORDER — CHLORHEXIDINE GLUCONATE 0.12 % MT SOLN
15.0000 mL | Freq: Two times a day (BID) | OROMUCOSAL | Status: DC
  Administered 2017-01-16 – 2017-02-02 (×32): 15 mL via OROMUCOSAL
  Filled 2017-01-16 (×32): qty 15

## 2017-01-16 MED ORDER — FENTANYL CITRATE (PF) 100 MCG/2ML IJ SOLN
INTRAMUSCULAR | Status: AC | PRN
  Administered 2017-01-16: 50 ug via INTRAVENOUS
  Administered 2017-01-16: 100 ug via INTRAVENOUS
  Administered 2017-01-16: 50 ug via INTRAVENOUS

## 2017-01-16 MED ORDER — SODIUM CHLORIDE 0.9% FLUSH
10.0000 mL | INTRAVENOUS | Status: DC | PRN
  Administered 2017-01-19: 10 mL
  Filled 2017-01-16: qty 40

## 2017-01-16 MED ORDER — ORAL CARE MOUTH RINSE
15.0000 mL | Freq: Two times a day (BID) | OROMUCOSAL | Status: DC
  Administered 2017-01-16 – 2017-02-02 (×27): 15 mL via OROMUCOSAL

## 2017-01-16 MED ORDER — MIDAZOLAM HCL 2 MG/2ML IJ SOLN
INTRAMUSCULAR | Status: AC | PRN
  Administered 2017-01-16 (×3): 1 mg via INTRAVENOUS

## 2017-01-16 MED ORDER — MAGNESIUM SULFATE IN D5W 1-5 GM/100ML-% IV SOLN
1.0000 g | Freq: Once | INTRAVENOUS | Status: AC
  Administered 2017-01-16: 1 g via INTRAVENOUS
  Filled 2017-01-16 (×2): qty 100

## 2017-01-16 MED ORDER — FAT EMULSION 20 % IV EMUL
240.0000 mL | INTRAVENOUS | Status: AC
  Administered 2017-01-16: 240 mL via INTRAVENOUS
  Filled 2017-01-16: qty 250

## 2017-01-16 MED ORDER — TRACE MINERALS CR-CU-MN-SE-ZN 10-1000-500-60 MCG/ML IV SOLN
INTRAVENOUS | Status: AC
  Administered 2017-01-16: 17:00:00 via INTRAVENOUS
  Filled 2017-01-16: qty 960

## 2017-01-16 MED ORDER — FENTANYL CITRATE (PF) 100 MCG/2ML IJ SOLN
INTRAMUSCULAR | Status: AC
  Filled 2017-01-16: qty 6

## 2017-01-16 MED ORDER — SODIUM CHLORIDE 0.9 % IV SOLN
INTRAVENOUS | Status: AC
  Administered 2017-01-16 – 2017-01-17 (×2): via INTRAVENOUS

## 2017-01-16 NOTE — Progress Notes (Signed)
Dressing continues to be clean, dry and intact.  38M RN assumed care of dressing.  IV came out enroute back to unit. 38M RN made aware.

## 2017-01-16 NOTE — Consult Note (Signed)
[facility] TEAM 1 - Stepdown/ICU TEAM CONSULT F/U Note  Albert PetrinSteven T Bonawitz  ZOX:[id number] DOB: 1976-03-13 DOA: 01/15/2017 PCP: Alysia PennaHolwerda, Scott, MD    Brief Narrative:  41 year old male w/ DM2, HLD, and OSA on CPAP who is s/p laparoscopic Roux-en-Y/gastric bypass on 12/08/16 in [place] by Dr. Franki Cabothomas Walsh, rehospitalized 12/30/16-01/10/17 for anasarca, abdominal pain, fever of 103F and postoperative seroma for which a percutaneous drain was placed.  He was aggressively diuresed with IV Lasix and reportedly lost 20 pounds, had ongoing abdominal pain even while hospitalized.  Following d/c home he noted ongoing lower abdominal pain, occasional nausea, nonbloody emesis, and decreased appetite. He was seen in follow-up by his PCP 8/29 who ordered labs and CT abdomen. CT abdomen showed gas containing 10.7 x 5 cm abscess abutting the posterior margin of the GJ anastomosis worrisome for dehiscence, diffuse peritonitis with large gas containing abscess throughout the peritoneal cavity including a right perihepatic abscess compressing the right liver lobe, ileus, small left and trace right dependent bilateral pleural effusions with moderate atelectasis.   Subjective: Patient reports ongoing abdominal pain but states that it has improved somewhat since his percutaneous drain placement.  He denies significant shortness of breath nausea vomiting or chest pain.  He denies pain in his lower extremities.  Assessment & Plan:  Sepsis due to postoperative intra-abdominal abscesses and peritonitis Continue empiric antibiotic until culture data available - ongoing care per General Surgery  Dyspnea Due to morbid obesity, OSA, atelectasis, small bilateral pleural effusions and deconditioning - appears stable at this time with moderate oxygen support  Sinus tachycardia  Likely due to sepsis/infection - patient also high risk for PE/DVT - will check lower extremity venous duplex - no chronic beta blocker use per  review of records  DM 2 Well-controlled at this time  OSA Continue nightly CPAP as patient will allow  Normocytic Anemia Check Fe studies - follow   HLD  Morbid obesity - Body mass index is 49.77 kg/m.   DVT prophylaxis:  Code Status: FULL CODE Family Communication: no family present at time of exam   Procedures: 8/31 CT guided abscess drain placement   Antimicrobials:  Zosyn 8/30 >  Objective: Blood pressure (!) 141/87, pulse (!) 125, temperature 98.2 F (36.8 C), temperature source Oral, resp. rate 17, height 5\' 10"  (1.778 m), weight (!) 157.4 kg (346 lb 14.4 oz), SpO2 92 %.  Intake/Output Summary (Last 24 hours) at 01/16/17 1517 Last data filed at 01/16/17 1345  Gross per 24 hour  Intake             1200 ml  Output             4425 ml  Net            -3225 ml   Filed Weights   01/15/17 1356 01/15/17 2238 01/16/17 0612  Weight: (!) 155.6 kg (343 lb) (!) 154.8 kg (341 lb 4.4 oz) (!) 157.4 kg (346 lb 14.4 oz)    Examination: General: No acute respiratory distress Lungs: Clear to auscultation bilaterally without wheezes or crackles Cardiovascular: Tachycardic but regular without appreciable murmur Abdomen: Morbidly obese, soft, bowel sounds positive, no rebound Extremities: 1+ bilateral lower extremity edema  CBC:  Recent Labs Lab 01/15/17 1420 01/16/17 0411  WBC 8.6 6.7  NEUTROABS 6.3  --   HGB 10.3* 9.5*  HCT 33.1* 30.4*  MCV 86.2 86.6  PLT 578* 514*   Basic Metabolic Panel:  Recent Labs Lab 01/15/17 1420 01/16/17 0411 01/16/17 0950  NA 133* 134*  --   K 3.9 3.9  --   CL 91* 96*  --   CO2 26 26  --   GLUCOSE 121* 121*  --   BUN 10 9  --   CREATININE 0.75 0.87  --   CALCIUM 8.8* 8.4*  --   MG  --  1.8  --   PHOS  --   --  4.9*   GFR: Estimated Creatinine Clearance: 170.5 mL/min (by C-G formula based on SCr of 0.87 mg/dL).  Liver Function Tests:  Recent Labs Lab 01/15/17 1420  AST 27  ALT 17  ALKPHOS 240*  BILITOT 1.1  PROT  7.0  ALBUMIN 2.2*    Coagulation Profile:  Recent Labs Lab 01/15/17 1420  INR 1.07    HbA1C: Hgb A1c MFr Bld  Date/Time Value Ref Range Status  02/29/2016 11:36 AM 8.6 (H) 4.8 - 5.6 % Final    Comment:    (NOTE)         Pre-diabetes: 5.7 - 6.4         Diabetes: >6.4         Glycemic control for adults with diabetes: <7.0     CBG:  Recent Labs Lab 01/16/17 0057 01/16/17 0413 01/16/17 0818 01/16/17 1124 01/16/17 1408  GLUCAP 132* 121* 123* 116* 124*    Recent Results (from the past 240 hour(s))  MRSA PCR Screening     Status: None   Collection Time: 01/15/17 11:04 PM  Result Value Ref Range Status   MRSA by PCR NEGATIVE NEGATIVE Final    Comment:        The GeneXpert MRSA Assay (FDA approved for NASAL specimens only), is one component of a comprehensive MRSA colonization surveillance program. It is not intended to diagnose MRSA infection nor to guide or monitor treatment for MRSA infections.      Scheduled Meds: . chlorhexidine  15 mL Mouth Rinse BID  . enoxaparin (LOVENOX) injection  40 mg Subcutaneous Q12H  . insulin aspart  2-6 Units Subcutaneous Q4H  . lidocaine (PF)      . mouth rinse  15 mL Mouth Rinse q12n4p  . pneumococcal 23 valent vaccine  0.5 mL Intramuscular Tomorrow-1000  . sodium chloride flush  5 mL Intravenous Q8H     LOS: 1 day   Lonia Blood, MD [facility] Office  [phone] Pager - Text Page per Amion as per below:  On-Call/Text Page:      [email or web address]      password [email or web address]  If 7PM-7AM, please contact night-coverage [email or web address] Password [email or web address] 01/16/2017, 3:17 PM

## 2017-01-16 NOTE — Sedation Documentation (Signed)
Pt rates pain a 7 out of 10.  Medications given.

## 2017-01-16 NOTE — Progress Notes (Signed)
Patient stated he had not worn his machine in a month since his surgery and it felt too strong. I let him know since it was his home unit I couldn't make any kind of changes on it, but I could bring one of our units and try it out, he said he would rather just wear 02 for the night. RT will continue to monitor.

## 2017-01-16 NOTE — Progress Notes (Signed)
   01/16/17 1805  Clinical Encounter Type  Visited With Patient and family together  Visit Type Follow-up  Referral From Chaplain  Consult/Referral To Chaplain  Spiritual Encounters  Spiritual Needs Prayer  Stress Factors  Patient Stress Factors Health changes  Family Stress Factors Family relationships  Introduction to Pt and spouse. Offered prayer of comfort and hope.

## 2017-01-16 NOTE — Progress Notes (Signed)
Provided contact information for Bariatric Coordinator to patient and wife.  No questions at this time.

## 2017-01-16 NOTE — Progress Notes (Signed)
Peripherally Inserted Central Catheter/Midline Placement  The IV Nurse has discussed with the patient and/or persons authorized to consent for the patient, the purpose of this procedure and the potential benefits and risks involved with this procedure.  The benefits include less needle sticks, lab draws from the catheter, and the patient may be discharged home with the catheter. Risks include, but not limited to, infection, bleeding, blood clot (thrombus formation), and puncture of an artery; nerve damage and irregular heartbeat and possibility to perform a PICC exchange if needed/ordered by physician.  Alternatives to this procedure were also discussed.  Bard Power PICC patient education guide, fact sheet on infection prevention and patient information card has been provided to patient /or left at bedside.    PICC/Midline Placement Documentation        Daphene Chisholm, Lajean ManesKerry Loraine 01/16/2017, 3:33 PM

## 2017-01-16 NOTE — Progress Notes (Signed)
Initial Nutrition Assessment  DOCUMENTATION CODES:   Morbid obesity  INTERVENTION:    TPN per pharmacy  NUTRITION DIAGNOSIS:   Inadequate oral intake related to  (peritonitis ) as evidenced by NPO status  GOAL:   Patient will meet greater than or equal to 90% of their needs  MONITOR:   Diet advancement, Labs, Weight trends, Skin, I & O's (TPN prescription )  REASON FOR ASSESSMENT:   Consult New TPN/TNA  ASSESSMENT:   41 yo Male with PMH of type II DM, HLD, OSA on nightly CPAP; admitted with sepsis secondary to postop intra-abdominal abscesses and peritonitis.  RD spoke with pt and wife at bedside.  Pt s/p laparoscopic Roux-en-Y/gastric bypass on 12/08/16. Was being followed by outpatient RD at [facility] in [place]. Some items he was consuming PTA include mashed potatoes, Muscle Milk and minced chicken.  He lost 60 lbs prior to surgery, however, does not know his total loss so far. Plan is for abscess drain placement today per IR. Labs and medications reviewed. Na 134 (L).  CBG's [id number].  Patient is receiving TPN with Clinimix E 5/15 @ 40 ml/hr and lipids @ 20 ml/hr. Provides 1162 kcal and 48 grams protein per day. Meets 50% minimum estimated energy needs and 42% minimum estimated protein needs.  Diet Order:  Diet NPO time specified  Skin:  Wound (see comment) (MASD to scrotum)  Last BM:  8/30  Height:   Ht Readings from Last 1 Encounters:  01/15/17 5\' 10"  (1.778 m)    Weight:   Wt Readings from Last 1 Encounters:  01/16/17 (!) 346 lb 14.4 oz (157.4 kg)    Ideal Body Weight:  75.4 kg  BMI:  Body mass index is 49.77 kg/m.  Estimated Nutritional Needs:   Kcal:  2300-2500  Protein:  115-130 gm  Fluid:  2.3-2.5 L  EDUCATION NEEDS:   No education needs identified at this time  Maureen ChattersKatie Dunbar Buras, RD, LDN Pager #: [phone] After-Hours Pager #: [phone]

## 2017-01-16 NOTE — Progress Notes (Signed)
[facility] Progress Note    Subjective: Patient looks okay.  No acute distress.  Objective: Vital signs in last 24 hours: Temp:  [97.8 F (36.6 C)-98.8 F (37.1 C)] 97.8 F (36.6 C) (08/31 0800) Pulse Rate:  [111-122] 111 (08/31 0800) Resp:  [12-25] 12 (08/31 0800) BP: (117-145)/(82-99) 131/97 (08/31 0800) SpO2:  [89 %-96 %] 93 % (08/31 0415) Weight:  [154.8 kg (341 lb 4.4 oz)-157.4 kg (346 lb 14.4 oz)] 157.4 kg (346 lb 14.4 oz) (08/31 0612) Last BM Date: 01/15/17  Intake/Output from previous day: 08/30 0701 - 08/31 0700 In: 1100 [I.V.:800; IV Piggyback:300] Out: 275 [Urine:275] Intake/Output this shift: Total I/O In: -  Out: 125 [Urine:125]  General: No acute distress  Lungs: Clear.  Abd: distended, excellent bowel sounds.  Tender diffusely  Extremities: Edematous,  Neuro: Intact  Lab Results:  @LABLAST2 (wbc:2,hgb:2,hct:2,plt:2) BMET ) Recent Labs  01/15/17 1420 01/16/17 0411  NA 133* 134*  K 3.9 3.9  CL 91* 96*  CO2 26 26  GLUCOSE 121* 121*  BUN 10 9  CREATININE 0.75 0.87  CALCIUM 8.8* 8.4*   PT/INR  Recent Labs  01/15/17 1420  LABPROT 13.8  INR 1.07   ABG No results for input(s): PHART, HCO3 in the last 72 hours.  Invalid input(s): PCO2, PO2  Studies/Results: Dg Chest 2 View  Result Date: 01/15/2017 CLINICAL DATA:  Shortness of breath. EXAM: CHEST  2 VIEW COMPARISON:  Radiographs of March 04, 2012. FINDINGS: Stable cardiomediastinal silhouette. No pneumothorax is noted. Hypoinflation of the lungs is noted with mild bibasilar subsegmental atelectasis. Mild left pleural effusion is noted. Bony thorax is unremarkable. IMPRESSION: Hypoinflation of the lungs with mild bibasilar subsegmental atelectasis. Mild left pleural effusion is noted. Electronically Signed   By: Lupita RaiderJames  Green Jr, M.D.   On: 01/15/2017 16:09   Ct Abdomen Pelvis W Contrast  Result Date: 01/15/2017 CLINICAL DATA:  Abdominopelvic pain. Status post gastric bypass surgery  11/28/2016. Elevated alkaline phosphatase. EXAM: CT ABDOMEN AND PELVIS WITH CONTRAST TECHNIQUE: Multidetector CT imaging of the abdomen and pelvis was performed using the standard protocol following bolus administration of intravenous contrast. CONTRAST:  125mL ISOVUE-300 IOPAMIDOL (ISOVUE-300) INJECTION 61% COMPARISON:  04/16/2015 pelvic CT.  02/28/2012 CT abdomen/pelvis. FINDINGS: Lower chest: Small left and trace right dependent bilateral pleural effusions with moderate bibasilar atelectasis predominantly involving the lower lobes and right middle lobe. Hepatobiliary: There is a large lentiform right perihepatic abscess measuring up to 20.0 x 11.2 cm with internal gas and with thick enhancing wall (series 3/image 17), which demonstrates prominent mass-effect on the right liver capsule. No liver mass. Normal gallbladder with no radiopaque cholelithiasis. No biliary ductal dilatation. Pancreas: Normal, with no mass or duct dilation. Spleen: There is a lentiform 12.1 x 2.4 cm thick walled fluid collection in the lateral perisplenic space (series 3/ image 26) with mass-effect on the lateral splenic capsule. Normal spleen size. No splenic mass. Adrenals/Urinary Tract: Normal adrenals. Nonobstructing 2 mm upper left renal stone. No hydronephrosis. Simple 1.8 cm renal cyst in the posterior lower left kidney. Otherwise normal kidneys, with no hydronephrosis. Normal bladder. Stomach/Bowel: Status post gastric bypass surgery. There is a gas containing abscess with thick enhancing wall measuring 10.7 x 5.0 cm abutting the posterior margin of the gastrojejunal anastomosis (series 3/ image 20). Smaller 4.3 x 2.5 cm fluid collection with thick enhancing wall along the greater curvature in the distal collapsed excluded stomach (series 3/image 32). No small bowel wall thickening. Mildly dilated mid small bowel loops with air-fluid levels,  measuring up to 3.7 cm diameter. No discrete small bowel caliber transition. Appendix not  discretely visualized. Mild sigmoid diverticulosis. No large bowel wall thickening. Vascular/Lymphatic: Atherosclerotic nonaneurysmal abdominal aorta. Patent hepatic, portal, splenic and renal veins. No pathologically enlarged lymph nodes in the abdomen or pelvis. Reproductive: Normal size prostate. Other: There is diffuse peritoneal thickening and hyperenhancement. There is a large loculated irregularly-shaped fluid collection conforming to the mid to lower peritoneal space measuring up to 27.8 x 19.6 cm (series 3/ image 75), which is continuous with the right perihepatic space collection. Musculoskeletal: No aggressive appearing focal osseous lesions. Mild thoracolumbar spondylosis. IMPRESSION: 1. Gas containing 10.7 x 5.0 cm abscess abutting the posterior margin of the gastrojejunal anastomosis, worrisome for dehiscence of the gastrojejunal anastomosis. 2. Diffuse peritonitis with multiple large gas-containing abscesses throughout the peritoneal cavity, including a large right perihepatic abscess compressing the right liver lobe. 3. Mildly dilated mid small bowel loops with air-fluid levels, favor mild adynamic ileus. 4. Small left and trace right dependent bilateral pleural effusions with moderate bibasilar atelectasis. 5. Nonobstructing tiny left renal stone. 6.  Aortic Atherosclerosis (ICD10-I70.0). These results were called by telephone at the time of interpretation on 01/15/2017 at 12:41 pm to Dr. Alysia Penna , who verbally acknowledged these results. Electronically Signed   By: Delbert Phenix M.D.   On: 01/15/2017 12:42    Anti-infectives: Anti-infectives    Start     Dose/Rate Route Frequency Ordered Stop   01/15/17 1815  piperacillin-tazobactam (ZOSYN) IVPB 3.375 g  Status:  Discontinued     3.375 g 12.5 mL/hr over 240 Minutes Intravenous Every 8 hours 01/15/17 1804 01/15/17 1804   01/15/17 1630  piperacillin-tazobactam (ZOSYN) IVPB 3.375 g     3.375 g 12.5 mL/hr over 240 Minutes Intravenous  Every 8 hours 01/15/17 1616        Assessment/Plan: s/p  For percutaneous drains today.  PICC and TPN.  Long term care needed.  LOS: 1 day   Marta Lamas. Gae Bon, MD, FACS [phone] [facility] 01/16/2017

## 2017-01-16 NOTE — Progress Notes (Addendum)
[facility] NOTE   Pharmacy Consult for TPN Indication: Prolonged NPO  Patient Measurements: Height: _0  (177.8 cm) Weight: (!) 346 lb 14.4 oz (157.4 kg) IBW/kg (Calculated) : 73 TPN AdjBW (KG): 93.4 Body mass index is 49.77 kg/m.  Assessment:  41 year old obese male s/p laparoscopic Roux-en-Y/gastric bypass surgery on 12/08/16 and hospitalization 8/14 through 01/10/17 for anasarca, abdominal pain, fever, and postoperative seroma with percutaneous drain placement now re-admitted with continued abdominal pain, occasional nausea, non-bloddy emesis, weight loss (on Lasix), and decreased appetite. CT of abdomen revealed multiple large gas containing intra-abdominal abscesses and concern for dehiscence.   GI: Multiple intra-abdominal abscesses and peritonitis, Anasarca/volume overload per notes-going for percutaneous drains placement today (8/31). Albumin is low at 2.2. Last BM was 01/15/17. Pepcid IV.  Endo: DM2, CBGs are at goal on ICU SSI q4h.  Insulin requirements in the past 24 hours: 6 units Lytes: Na and Cl little low at 134/96. K 3.9. Mg 1.8. Phos elevated at 4.9, CoCa 9.8.  Renal: SCr 0.75>>0.87. Est CrCl ~ 170 ml/min. BUN 9. I/O + 825 cc. UOP low at 0.1 cc/kg/hr.  IVF - NS at 100 ml/hr.  Pulm: 2L [place] (CPAP at home [facility]) Cards:HLD,  BP slightly elevated and ST, 110 to 120. Hepatobil: Alk Phos is elevated at 240. AST and ALT are normal. Tbili is 1.1.  Neuro: GCS is 15. RASS is 0. Pain is 7-8 out of 10. IV Dilaudid prn.  ID: WBC within normal limits. Afebrile. Lactic acid normal on admission.   Best Practices: TPN Access: PICC 01/16/17 TPN start date: 01/16/17  Nutritional Goals (per RD recommendation 8/31): KCal: 1962-2297 Protein: 115-130 g   Current Nutrition:  NPO, initiating TPN  Plan:  Initiate Clinimix E 5/15 at 70m/hr  Start 20% lipid emulsion at 260mhr x12 hrs Decrease NS to 60 ml/hr - Plan reviewed with Dr. WyHulen Skainsn 8/31 AM.   This provides 48g of protein and 1160 kCals per day meeting ~30% of protein and ~68% of kcal needs.  Magnesium 1g IV x1 today. Monitor Phosphorus closely.  Add MVI and trace elements in TPN. Discontinue IV Pepcid and add Pepcid 4067mo TPN bag. Continue standard SSI every 4 hours and adjust as needed Monitor TPN labs, watch TG level  F/U volume status and need to adjust fluid rates  JesSloan LeiterharmD, BCPS Clinical Pharmacist Clinical phone 01/16/2017 until 3:30PM - #- [phone] hours, please call #[phone] 01/16/2017,8:41 AM

## 2017-01-16 NOTE — Sedation Documentation (Signed)
Pt reports pain has improved after administration of 1mg  of hydromorphone.  Currently rates pain a 7 out of 10.

## 2017-01-16 NOTE — Progress Notes (Signed)
RT NOTE:  Pt has home CPAP @ bedside, however, he complains the pressure is to high and uncomfortable. I explained than I am unable to adjust his home machine due to hospital policy. I offered to bring one of our machines and he refused and stated he is ok wearing a . I also advised the patient and his wife that they can call the home health company that provides the machine and explain what is going on and they might be able to send a home health RT to adjust settings for him. Pt understands he is able to call RT if he changes his mind and wants to use one of our machines.

## 2017-01-16 NOTE — Progress Notes (Signed)
ANTICOAGULATION CONSULT NOTE - Initial Consult  Pharmacy Consult for Enoxaparin Indication: DVT prophylaxis  No Known Allergies  Patient Measurements: Height: 5\' 10"  (177.8 cm) Weight: (!) 346 lb 14.4 oz (157.4 kg) IBW/kg (Calculated) : 73  Vital Signs: Temp: 97.8 F (36.6 C) (08/31 0800) Temp Source: Oral (08/31 0800) BP: 131/97 (08/31 0800) Pulse Rate: 111 (08/31 0800)  Labs:  Recent Labs  01/15/17 1420 01/16/17 0411  HGB 10.3* 9.5*  HCT 33.1* 30.4*  PLT 578* 514*  LABPROT 13.8  --   INR 1.07  --   CREATININE 0.75 0.87    Estimated Creatinine Clearance: 170.5 mL/min (by C-G formula based on SCr of 0.87 mg/dL).   Medical History: Past Medical History:  Diagnosis Date  . Abscess   . Diabetes mellitus without complication (HCC)   . Hidradenitis suppurativa   . Hyperlipemia   . Morbid obesity (HCC)   . Vertigo     Assessment: 41 yo M presents on 8/30 with abdominal pain. Recent Roux-en-Y procedure with post op complications including seroma, ascites, and now abscesses. Pharmacy consulted to dose Lovenox for DVT prophylaxis. Discussed with MD and PA. Would like to have on a BID Lovenox regimen. Hgb 9.5, plts 514.  Goal of Therapy:  Monitor platelets by anticoagulation protocol: Yes   Plan:  Start enoxaparin 40mg  [place] Q12h tonight at 2000 Monitor CBC, s/s of bleed  Enzo BiNathan Casi Westerfeld, PharmD, [facility] Center[facility] Clinical Pharmacist Pager [phone] 01/16/2017 8:47 AM

## 2017-01-16 NOTE — Progress Notes (Signed)
Spoke with patient and wife. Drain has produced >2L purulent fluid and patient feels much better. We discussed that the drain is in the lower abdomen and there may be a need to place a drain in the fluid collection directly posterior to the leaking anastomosis and may need to repeat a CT scan over the weekend to further evaluate.  We also discussed the plan with goals of: 1. controlling the leak and infection with antibiotics, strict NPO and drainage 2. Once controlled, reestablishing enteral nutrition likely via a G tube into the excluded stomach 3. Once nutrition has improved, to likely undergo revision of the gastrojejunostomy We discussed that [facility] his case and will continue to treat him. We discussed that this is a long process with multiple variables that could change or delay care and that it may take multiple months before he is able to eat by mouth and the leak is fully healed. The patient and wife showed understanding  Feliciana RossettiLuke Kinsinger, M.D. Bariatric Surgeon [facility] Pg: [phone]

## 2017-01-16 NOTE — Consult Note (Signed)
Chief Complaint: Patient was seen in consultation today for abdominal abscess drain placement Chief Complaint  Patient presents with  . Abdominal Pain   at the request of Dr Fredonia HighlandE Wilson  Referring Physician(s): [facility]  Supervising Physician: Oley BalmHassell, Daniel  [facility]  History of Present Illness: Charlyne PetrinSteven T Orth is a 41 y.o. male   Gastric bypass surgery 12/08/2016---[place] Has never felt well since surgery Wt gain And bloating; pain Developed fever Post op seroma was drain  01/10/17  Admitted to [facility] with fluid overload; fever; abd pain CT yesterday IMPRESSION: 1. Gas containing 10.7 x 5.0 cm abscess abutting the posterior margin of the gastrojejunal anastomosis, worrisome for dehiscence of the gastrojejunal anastomosis. 2. Diffuse peritonitis with multiple large gas-containing abscesses throughout the peritoneal cavity, including a large right perihepatic abscess compressing the right liver lobe. 3. Mildly dilated mid small bowel loops with air-fluid levels, favor mild adynamic ileus. 4. Small left and trace right dependent bilateral pleural effusions with moderate bibasilar atelectasis. 5. Nonobstructing tiny left renal stone. 6.  Aortic Atherosclerosis (ICD10-I70.0).  Dr Andrey CampanileWilson has requested intra abdominal abscess drain placement' Dr Deanne CofferHassell has reviewed imaging and approves procedure   Past Medical History:  Diagnosis Date  . Abscess   . Diabetes mellitus without complication (HCC)   . Hidradenitis suppurativa   . Hyperlipemia   . Morbid obesity (HCC)   . Vertigo     Past Surgical History:  Procedure Laterality Date  . INCISION AND DRAINAGE ABSCESS Right 12/15/2013   Procedure: INCISION AND DRAINAGE Right Posterior neck ABSCESS;  Surgeon: Cherylynn RidgesJames O [name], MD;  [facility];  Service: General;  Laterality: Right;  . INCISION AND DRAINAGE DEEP NECK ABSCESS     [facility] [place]    Allergies: Patient  has no known allergies.  Medications: Prior to Admission medications   Medication Sig Start Date End Date Taking? Authorizing Provider  acetaminophen (TYLENOL) 325 MG tablet Take 2 tablets (650 mg total) by mouth every 8 (eight) hours. Patient taking differently: Take 650 mg by mouth every 8 (eight) hours as needed for moderate pain or headache.  02/27/16  Yes Mesner, Barbara CowerJason, MD  atorvastatin (LIPITOR) 10 MG tablet Take 10 mg by mouth every evening.  09/18/15  Yes [provider]  calcium carbonate (TUMS - DOSED IN MG ELEMENTAL CALCIUM) 500 MG chewable tablet Chew 500 mg by mouth as needed for heartburn.   Yes [provider]  nystatin (MYCOSTATIN) 100000 UNIT/ML suspension Take by mouth as needed. Thrush. 1 tsp 11/27/16  Yes [provider]  omeprazole (PRILOSEC) 20 MG capsule Take 20 mg by mouth 2 (two) times daily. 11/27/16  Yes [provider]  OVER THE COUNTER MEDICATION Apply 1 application topically as needed (eczema). [employer] Eczema Cream    Yes [provider]  oxyCODONE (ROXICODONE) 5 MG immediate release tablet Take 1 tablet (5 mg total) by mouth every 6 (six) hours as needed for severe pain. 03/05/16  Yes Ghimire, Werner LeanShanker M, MD  traZODone (DESYREL) 100 MG tablet Take 100 mg by mouth at bedtime.   Yes [provider]  ursodiol (ACTIGALL) 300 MG capsule Take 300 mg by mouth 2 (two) times daily. 11/27/16  Yes [provider]  cephALEXin (KEFLEX) 500 MG capsule Take 1 capsule (500 mg total) by mouth 3 (three) times daily. Patient not taking: Reported on 01/15/2017 03/05/16   Maretta BeesGhimire, Shanker M, MD  doxycycline (VIBRA-TABS) 100 MG tablet Take  1 tablet (100 mg total) by mouth 2 (two) times daily. Patient not taking: Reported on 01/15/2017 03/05/16   Maretta Bees, MD  metFORMIN (GLUCOPHAGE) 500 MG tablet Take 2 tablets (1,000 mg total) by mouth 2 (two) times daily with a meal. Patient not taking: Reported on 01/15/2017 03/05/16    Maretta Bees, MD     Family History  Problem Relation Age of Onset  . Arthritis Mother   . Hypertension Mother     Social History   Social History  . Marital status: Married    Spouse name: N/A  . Number of children: 0  . Years of education: 7th   Occupational History  . Construction     Social History Main Topics  . Smoking status: Former Smoker    Packs/day: 1.00    Quit date: 11/27/2013  . Smokeless tobacco: Never Used  . Alcohol use No  . Drug use: No  . Sexual activity: Not Asked   Other Topics Concern  . None   Social History Narrative   Drinks about 1 soda a day     Review of Systems: A 12 point ROS discussed and pertinent positives are indicated in the HPI above.  All other systems are negative.  Review of Systems  Constitutional: Positive for activity change, appetite change, fatigue and fever.  Respiratory: Negative for shortness of breath.   Cardiovascular: Negative for chest pain.  Gastrointestinal: Positive for abdominal pain and nausea.  Neurological: Negative for weakness.  Psychiatric/Behavioral: Negative for behavioral problems and confusion.    Vital Signs: BP (!) 131/97 (BP Location: Right Arm)   Pulse (!) 111   Temp 97.8 F (36.6 C) (Oral)   Resp 12   Ht 5\' 10"  (1.778 m)   Wt (!) 346 lb 14.4 oz (157.4 kg)   SpO2 93%   BMI 49.77 kg/m   Physical Exam  Constitutional: He is oriented to person, place, and time.  Cardiovascular: Normal rate and regular rhythm.   Pulmonary/Chest: Effort normal and breath sounds normal.  Abdominal: Soft. Bowel sounds are normal. He exhibits distension. There is tenderness.  Musculoskeletal: Normal range of motion.  Neurological: He is alert and oriented to person, place, and time.  Skin: Skin is warm and dry.  Psychiatric: He has a normal mood and affect. His behavior is normal. Judgment and thought content normal.  Nursing note and vitals reviewed.   Mallampati Score:  MD Evaluation Airway:  WNL Heart: WNL Abdomen: WNL Chest/ Lungs: WNL ASA  Classification: 3 Mallampati/Airway Score: One  Imaging: Dg Chest 2 View  Result Date: 01/15/2017 CLINICAL DATA:  Shortness of breath. EXAM: CHEST  2 VIEW COMPARISON:  Radiographs of March 04, 2012. FINDINGS: Stable cardiomediastinal silhouette. No pneumothorax is noted. Hypoinflation of the lungs is noted with mild bibasilar subsegmental atelectasis. Mild left pleural effusion is noted. Bony thorax is unremarkable. IMPRESSION: Hypoinflation of the lungs with mild bibasilar subsegmental atelectasis. Mild left pleural effusion is noted. Electronically Signed   By: Lupita Raider, M.D.   On: 01/15/2017 16:09   Ct Abdomen Pelvis W Contrast  Result Date: 01/15/2017 CLINICAL DATA:  Abdominopelvic pain. Status post gastric bypass surgery 11/28/2016. Elevated alkaline phosphatase. EXAM: CT ABDOMEN AND PELVIS WITH CONTRAST TECHNIQUE: Multidetector CT imaging of the abdomen and pelvis was performed using the standard protocol following bolus administration of intravenous contrast. CONTRAST:  ISOVUE-300 IOPAMIDOL (ISOVUE-300) INJECTION 61% COMPARISON:  04/16/2015 pelvic CT.  02/28/2012 CT abdomen/pelvis. FINDINGS: Lower chest: Small left and  trace right dependent bilateral pleural effusions with moderate bibasilar atelectasis predominantly involving the lower lobes and right middle lobe. Hepatobiliary: There is a large lentiform right perihepatic abscess measuring up to 20.0 x 11.2 cm with internal gas and with thick enhancing wall (series 3/image 17), which demonstrates prominent mass-effect on the right liver capsule. No liver mass. Normal gallbladder with no radiopaque cholelithiasis. No biliary ductal dilatation. Pancreas: Normal, with no mass or duct dilation. Spleen: There is a lentiform 12.1 x 2.4 cm thick walled fluid collection in the lateral perisplenic space (series 3/ image 26) with mass-effect on the lateral splenic capsule. Normal spleen  size. No splenic mass. Adrenals/Urinary Tract: Normal adrenals. Nonobstructing 2 mm upper left renal stone. No hydronephrosis. Simple 1.8 cm renal cyst in the posterior lower left kidney. Otherwise normal kidneys, with no hydronephrosis. Normal bladder. Stomach/Bowel: Status post gastric bypass surgery. There is a gas containing abscess with thick enhancing wall measuring 10.7 x 5.0 cm abutting the posterior margin of the gastrojejunal anastomosis (series 3/ image 20). Smaller 4.3 x 2.5 cm fluid collection with thick enhancing wall along the greater curvature in the distal collapsed excluded stomach (series 3/image 32). No small bowel wall thickening. Mildly dilated mid small bowel loops with air-fluid levels, measuring up to 3.7 cm diameter. No discrete small bowel caliber transition. Appendix not discretely visualized. Mild sigmoid diverticulosis. No large bowel wall thickening. Vascular/Lymphatic: Atherosclerotic nonaneurysmal abdominal aorta. Patent hepatic, portal, splenic and renal veins. No pathologically enlarged lymph nodes in the abdomen or pelvis. Reproductive: Normal size prostate. Other: There is diffuse peritoneal thickening and hyperenhancement. There is a large loculated irregularly-shaped fluid collection conforming to the mid to lower peritoneal space measuring up to 27.8 x 19.6 cm (series 3/ image 75), which is continuous with the right perihepatic space collection. Musculoskeletal: No aggressive appearing focal osseous lesions. Mild thoracolumbar spondylosis. IMPRESSION: 1. Gas containing 10.7 x 5.0 cm abscess abutting the posterior margin of the gastrojejunal anastomosis, worrisome for dehiscence of the gastrojejunal anastomosis. 2. Diffuse peritonitis with multiple large gas-containing abscesses throughout the peritoneal cavity, including a large right perihepatic abscess compressing the right liver lobe. 3. Mildly dilated mid small bowel loops with air-fluid levels, favor mild adynamic ileus.  4. Small left and trace right dependent bilateral pleural effusions with moderate bibasilar atelectasis. 5. Nonobstructing tiny left renal stone. 6.  Aortic Atherosclerosis (ICD10-I70.0). These results were called by telephone at the time of interpretation on 01/15/2017 at 12:41 pm to Dr. Alysia Penna , who verbally acknowledged these results. Electronically Signed   By: Delbert Phenix M.D.   On: 01/15/2017 12:42    Labs:  CBC:  Recent Labs  03/04/16 0355 03/05/16 0702 01/15/17 1420 01/16/17 0411  WBC 4.8 4.8 8.6 6.7  HGB 14.1 14.6 10.3* 9.5*  HCT 41.8 42.3 33.1* 30.4*  PLT 181 161 578* 514*    COAGS:  Recent Labs  01/15/17 1420  INR 1.07    BMP:  Recent Labs  03/04/16 0355 03/05/16 0702 01/15/17 1420 01/16/17 0411  NA 137 136 133* 134*  K 4.3 4.1 3.9 3.9  CL 103 106 91* 96*  CO2 27 20* 26 26  GLUCOSE 143* 153* 121* 121*  BUN 7 8 10 9   CALCIUM 8.7* 8.7* 8.8* 8.4*  CREATININE 0.67 0.66 0.75 0.87  GFRNONAA >60 >60 >60 >60  GFRAA >60 >60 >60 >60    LIVER FUNCTION TESTS:  Recent Labs  01/15/17 1420  BILITOT 1.1  AST 27  ALT 17  ALKPHOS  240*  PROT 7.0  ALBUMIN 2.2*    TUMOR MARKERS: No results for input(s): AFPTM, CEA, CA199, CHROMGRNA in the last 8760 hours.  Assessment and Plan:  Gastric bypass post op intra abdominal abscess Scheduled for abscess drain placement Risks and benefits discussed with the patient including bleeding, infection, damage to adjacent structures, bowel perforation/fistula connection, and sepsis. All of the patient's questions were answered, patient is agreeable to proceed. Consent signed and in chart.   Thank you for this interesting consult.  I greatly enjoyed meeting ONYEKACHI GATHRIGHT and look forward to participating in their care.  A copy of this report was sent to the requesting provider on this date.  Electronically Signed: Robet Leu, PA-C 01/16/2017, 9:04 AM   I spent a total of 40 Minutes    in face to face  in clinical consultation, greater than 50% of which was counseling/coordinating care for intra abd abscess drain

## 2017-01-16 NOTE — Procedures (Signed)
  Procedure:   CT abd abscess drain  31F RLQ Preprocedure diagnosis:  Postop abscess Postprocedure diagnosis:  same EBL:     minimal Complications:   none immediate  See full dictation in [facility] PACS.  Thora Lance. Fiona Coto MD Main # [phone] Pager  [phone]

## 2017-01-17 ENCOUNTER — Inpatient Hospital Stay (HOSPITAL_COMMUNITY): Payer: 59

## 2017-01-17 DIAGNOSIS — R000 Tachycardia, unspecified: Secondary | ICD-10-CM

## 2017-01-17 DIAGNOSIS — R609 Edema, unspecified: Secondary | ICD-10-CM

## 2017-01-17 LAB — GLUCOSE, CAPILLARY
Glucose-Capillary: 118 mg/dL — ABNORMAL HIGH (ref 65–99)
Glucose-Capillary: 133 mg/dL — ABNORMAL HIGH (ref 65–99)
Glucose-Capillary: 133 mg/dL — ABNORMAL HIGH (ref 65–99)
Glucose-Capillary: 153 mg/dL — ABNORMAL HIGH (ref 65–99)
Glucose-Capillary: 153 mg/dL — ABNORMAL HIGH (ref 65–99)
Glucose-Capillary: 162 mg/dL — ABNORMAL HIGH (ref 65–99)

## 2017-01-17 LAB — DIFFERENTIAL
BASOS ABS: 0 10*3/uL (ref 0.0–0.1)
BASOS PCT: 1 %
Eosinophils Absolute: 0.1 10*3/uL (ref 0.0–0.7)
Eosinophils Relative: 1 %
LYMPHS ABS: 1 10*3/uL (ref 0.7–4.0)
LYMPHS PCT: 18 %
MONO ABS: 0.6 10*3/uL (ref 0.1–1.0)
MONOS PCT: 10 %
NEUTROS ABS: 4.1 10*3/uL (ref 1.7–7.7)
NEUTROS PCT: 70 %

## 2017-01-17 LAB — VITAMIN B12: VITAMIN B 12: 1441 pg/mL — AB (ref 180–914)

## 2017-01-17 LAB — FOLATE: Folate: 7.7 ng/mL (ref >5.9)

## 2017-01-17 LAB — COMPREHENSIVE METABOLIC PANEL
ALBUMIN: 1.7 g/dL — AB (ref 3.5–5.0)
ALT: 13 U/L — ABNORMAL LOW (ref 17–63)
ANION GAP: 8 (ref 5–15)
AST: 19 U/L (ref 15–41)
Alkaline Phosphatase: 168 U/L — ABNORMAL HIGH (ref 38–126)
BILIRUBIN TOTAL: 0.7 mg/dL (ref 0.3–1.2)
BUN: 5 mg/dL — AB (ref 6–20)
CO2: 30 mmol/L (ref 22–32)
Calcium: 8 mg/dL — ABNORMAL LOW (ref 8.9–10.3)
Chloride: 97 mmol/L — ABNORMAL LOW (ref 101–111)
Creatinine, Ser: 0.66 mg/dL (ref 0.61–1.24)
GFR calc Af Amer: >60 mL/min (ref >60)
GFR calc non Af Amer: >60 mL/min (ref >60)
Glucose, Bld: 160 mg/dL — ABNORMAL HIGH (ref 65–99)
POTASSIUM: 3.6 mmol/L (ref 3.5–5.1)
SODIUM: 135 mmol/L (ref 135–145)
Total Protein: 5.8 g/dL — ABNORMAL LOW (ref 6.5–8.1)

## 2017-01-17 LAB — CBC
HEMATOCRIT: 29 % — AB (ref 39.0–52.0)
HEMOGLOBIN: 8.9 g/dL — AB (ref 13.0–17.0)
MCH: 26.5 pg (ref 26.0–34.0)
MCHC: 30.7 g/dL (ref 30.0–36.0)
MCV: 86.3 fL (ref 78.0–100.0)
Platelets: 478 10*3/uL — ABNORMAL HIGH (ref 150–400)
RBC: 3.36 MIL/uL — AB (ref 4.22–5.81)
RDW: 16.3 % — ABNORMAL HIGH (ref 11.5–15.5)
WBC: 5.8 10*3/uL (ref 4.0–10.5)

## 2017-01-17 LAB — IRON AND TIBC
IRON: 14 ug/dL — AB (ref 45–182)
Saturation Ratios: 11 % — ABNORMAL LOW (ref 17.9–39.5)
TIBC: 122 ug/dL — AB (ref 250–450)
UIBC: 108 ug/dL

## 2017-01-17 LAB — PHOSPHORUS: PHOSPHORUS: 3.8 mg/dL (ref 2.5–4.6)

## 2017-01-17 LAB — MAGNESIUM: Magnesium: 1.9 mg/dL (ref 1.7–2.4)

## 2017-01-17 LAB — RETICULOCYTES
RBC.: 3.36 MIL/uL — ABNORMAL LOW (ref 4.22–5.81)
RETIC CT PCT: 2.9 % (ref 0.4–3.1)
Retic Count, Absolute: 97.4 10*3/uL (ref 19.0–186.0)

## 2017-01-17 LAB — PREALBUMIN: Prealbumin: 5.1 mg/dL — ABNORMAL LOW (ref 18–38)

## 2017-01-17 LAB — FERRITIN: Ferritin: 342 ng/mL — ABNORMAL HIGH (ref 24–336)

## 2017-01-17 LAB — TRIGLYCERIDES: TRIGLYCERIDES: 135 mg/dL (ref <150)

## 2017-01-17 MED ORDER — FAT EMULSION 20 % IV EMUL
240.0000 mL | INTRAVENOUS | Status: AC
  Administered 2017-01-17: 240 mL via INTRAVENOUS
  Filled 2017-01-17: qty 250

## 2017-01-17 MED ORDER — TRACE MINERALS CR-CU-MN-SE-ZN 10-1000-500-60 MCG/ML IV SOLN
INTRAVENOUS | Status: AC
  Administered 2017-01-17: 18:00:00 via INTRAVENOUS
  Filled 2017-01-17: qty 1560

## 2017-01-17 MED ORDER — INSULIN ASPART 100 UNIT/ML ~~LOC~~ SOLN
0.0000 [IU] | SUBCUTANEOUS | Status: DC
  Administered 2017-01-17: 4 [IU] via SUBCUTANEOUS
  Administered 2017-01-17 (×2): 3 [IU] via SUBCUTANEOUS
  Administered 2017-01-17: 4 [IU] via SUBCUTANEOUS
  Administered 2017-01-18 (×4): 3 [IU] via SUBCUTANEOUS
  Administered 2017-01-18 – 2017-01-19 (×3): 4 [IU] via SUBCUTANEOUS
  Administered 2017-01-19: 3 [IU] via SUBCUTANEOUS
  Administered 2017-01-19 – 2017-01-20 (×6): 4 [IU] via SUBCUTANEOUS
  Administered 2017-01-20: 3 [IU] via SUBCUTANEOUS
  Administered 2017-01-20 (×2): 4 [IU] via SUBCUTANEOUS
  Administered 2017-01-21 (×2): 3 [IU] via SUBCUTANEOUS
  Administered 2017-01-21 (×2): 4 [IU] via SUBCUTANEOUS
  Administered 2017-01-21: 3 [IU] via SUBCUTANEOUS
  Administered 2017-01-21 – 2017-01-22 (×3): 4 [IU] via SUBCUTANEOUS
  Administered 2017-01-22: 3 [IU] via SUBCUTANEOUS
  Administered 2017-01-22: 4 [IU] via SUBCUTANEOUS
  Administered 2017-01-22 – 2017-01-23 (×3): 3 [IU] via SUBCUTANEOUS
  Administered 2017-01-23: 4 [IU] via SUBCUTANEOUS
  Administered 2017-01-23 – 2017-01-24 (×4): 3 [IU] via SUBCUTANEOUS
  Administered 2017-01-24: 4 [IU] via SUBCUTANEOUS
  Administered 2017-01-24 (×2): 3 [IU] via SUBCUTANEOUS
  Administered 2017-01-24: 4 [IU] via SUBCUTANEOUS
  Administered 2017-01-24 – 2017-01-25 (×2): 3 [IU] via SUBCUTANEOUS
  Administered 2017-01-25: 4 [IU] via SUBCUTANEOUS
  Administered 2017-01-25 – 2017-01-30 (×23): 3 [IU] via SUBCUTANEOUS

## 2017-01-17 MED ORDER — SODIUM CHLORIDE 0.9 % IV SOLN
INTRAVENOUS | Status: DC
  Administered 2017-01-17 – 2017-01-25 (×6): via INTRAVENOUS
  Administered 2017-01-29: 35 mL/h via INTRAVENOUS
  Administered 2017-01-30: 17:00:00 via INTRAVENOUS

## 2017-01-17 MED ORDER — LEVALBUTEROL HCL 0.63 MG/3ML IN NEBU: 0.6300 mg | INHALATION_SOLUTION | Freq: Four times a day (QID) | RESPIRATORY_TRACT | Status: DC | PRN

## 2017-01-17 MED ORDER — CHLORHEXIDINE GLUCONATE CLOTH 2 % EX PADS: 6.0000 | MEDICATED_PAD | Freq: Every day | CUTANEOUS | Status: DC

## 2017-01-17 MED ORDER — ACETAMINOPHEN 10 MG/ML IV SOLN
1000.0000 mg | Freq: Four times a day (QID) | INTRAVENOUS | Status: AC
  Administered 2017-01-17 – 2017-01-18 (×4): 1000 mg via INTRAVENOUS
  Filled 2017-01-17 (×4): qty 100

## 2017-01-17 MED ORDER — POTASSIUM CHLORIDE 10 MEQ/50ML IV SOLN
10.0000 meq | INTRAVENOUS | Status: AC
  Administered 2017-01-17: 10 meq via INTRAVENOUS
  Filled 2017-01-17: qty 50

## 2017-01-17 MED ORDER — SODIUM CHLORIDE 0.9% FLUSH: 10.0000 mL | INTRAVENOUS | Status: DC | PRN

## 2017-01-17 NOTE — Consult Note (Signed)
[facility] TEAM 1 - Stepdown/ICU TEAM CONSULT F/U Note  Albert White  ZOX:[id number] DOB: [birth year] DOA: 01/15/2017 PCP: Alysia Penna, MD    Brief Narrative:  40 year old male w/ DM2, HLD, and OSA on CPAP who is s/p laparoscopic Roux-en-Y/gastric bypass on 12/08/16 in [place] by Dr. Franki Cabot, rehospitalized 12/30/16-01/10/17 for anasarca, abdominal pain, fever of 103F and postoperative seroma for which a percutaneous drain was placed.  He was aggressively diuresed with IV Lasix and reportedly lost 20 pounds, had ongoing abdominal pain even while hospitalized.  Following d/c home he noted ongoing lower abdominal pain, occasional nausea, nonbloody emesis, and decreased appetite. He was seen in follow-up by his PCP 8/29 who ordered labs and CT abdomen. CT abdomen showed gas containing 10.7 x 5 cm abscess abutting the posterior margin of the GJ anastomosis worrisome for dehiscence, diffuse peritonitis with large gas containing abscess throughout the peritoneal cavity including a right perihepatic abscess compressing the right liver lobe, ileus, small left and trace right dependent bilateral pleural effusions with moderate atelectasis.   Subjective: The patient reports that his pain is poorly controlled on his current regimen.  He denies shortness of breath nausea vomiting headache or substernal chest pressure.  Assessment & Plan:  Sepsis due to postoperative intra-abdominal abscesses and peritonitis Continue empiric antibiotic until culture data available - ongoing care per General Surgery  Dyspnea Due to morbid obesity, OSA, atelectasis, small bilateral pleural effusions and deconditioning - improving - not significantly hypoxic   Sinus tachycardia  Likely due to sepsis/infection - patient also high risk for PE/DVT - lower extremity venous duplex pending - no chronic beta blocker use per review of records  DM 2 Well-controlled at this time  OSA Continue nightly CPAP as patient  will allow  Normocytic Anemia Fe studies most c/w anemia related to poor nutritional status/chornic inflammation w/ TIBC very low - avoid IV Fe supplementation for now given signif infection - TNA per Pharm   HLD  Morbid obesity - Body mass index is 48.77 kg/m.   DVT prophylaxis: lovenox  Code Status: FULL CODE Family Communication: no family present at time of exam   Procedures: 8/31 CT guided abscess drain placement   Antimicrobials:  Zosyn 8/30 >  Objective: Blood pressure 138/87, pulse (!) 108, temperature 98.9 F (37.2 C), temperature source Oral, resp. rate 17, height 5\' 10"  (1.778 m), weight (!) 154.2 kg (339 lb 14.4 oz), SpO2 94 %.  Intake/Output Summary (Last 24 hours) at 01/17/17 1128 Last data filed at 01/17/17 0654  Gross per 24 hour  Intake             1700 ml  Output             5175 ml  Net            -3475 ml   Filed Weights   01/15/17 2238 01/16/17 0612 01/17/17 0408  Weight: (!) 154.8 kg (341 lb 4.4 oz) (!) 157.4 kg (346 lb 14.4 oz) (!) 154.2 kg (339 lb 14.4 oz)    Examination: General: No acute respiratory distress - alert  Lungs: Clear to auscultation bilaterally - distant bs th/o - no wheezing  Cardiovascular: Tachycardic but regular - no gallup or M or rub  Abdomen: Morbidly obese, soft, bowel sounds positive, no rebound - drain in place R LQ Extremities: 1+ bilateral lower extremity edema w/o signif change   CBC:  Recent Labs Lab 01/15/17 1420 01/16/17 0411 01/17/17 0407  WBC 8.6 6.7 5.8  NEUTROABS 6.3  --  4.1  HGB 10.3* 9.5* 8.9*  HCT 33.1* 30.4* 29.0*  MCV 86.2 86.6 86.3  PLT 578* 514* 478*   Basic Metabolic Panel:  Recent Labs Lab 01/15/17 1420 01/16/17 0411 01/16/17 0950 01/17/17 0407  NA 133* 134*  --  135  K 3.9 3.9  --  3.6  CL 91* 96*  --  97*  CO2 26 26  --  30  GLUCOSE 121* 121*  --  160*  BUN 10 9  --  5*  CREATININE 0.75 0.87  --  0.66  CALCIUM 8.8* 8.4*  --  8.0*  MG  --  1.8  --  1.9  PHOS  --   --  4.9*  3.8   GFR: Estimated Creatinine Clearance: 183.2 mL/min (by C-G formula based on SCr of 0.66 mg/dL).  Liver Function Tests:  Recent Labs Lab 01/15/17 1420 01/17/17 0407  AST 27 19  ALT 17 13*  ALKPHOS 240* 168*  BILITOT 1.1 0.7  PROT 7.0 5.8*  ALBUMIN 2.2* 1.7*    Coagulation Profile:  Recent Labs Lab 01/15/17 1420  INR 1.07    HbA1C: Hgb A1c MFr Bld  Date/Time Value Ref Range Status  02/29/2016 11:36 AM 8.6 (H) 4.8 - 5.6 % Final    Comment:    (NOTE)         Pre-diabetes: 5.7 - 6.4         Diabetes: >6.4         Glycemic control for adults with diabetes: <7.0     CBG:  Recent Labs Lab 01/16/17 1553 01/16/17 2001 01/16/17 2346 01/17/17 0354 01/17/17 0747  GLUCAP 109* 134* 118* 153* 162*    Recent Results (from the past 240 hour(s))  Culture, blood (Routine x 2)     Status: None (Preliminary result)   Collection Time: 01/15/17  2:20 PM  Result Value Ref Range Status   Specimen Description BLOOD LEFT HAND  Final   Special Requests   Final    BOTTLES DRAWN AEROBIC AND ANAEROBIC Blood Culture adequate volume   Culture NO GROWTH 2 DAYS  Final   Report Status PENDING  Incomplete  Culture, blood (Routine x 2)     Status: None (Preliminary result)   Collection Time: 01/15/17  3:25 PM  Result Value Ref Range Status   Specimen Description BLOOD RIGHT HAND  Final   Special Requests   Final    BOTTLES DRAWN AEROBIC AND ANAEROBIC Blood Culture adequate volume   Culture NO GROWTH 2 DAYS  Final   Report Status PENDING  Incomplete  MRSA PCR Screening     Status: None   Collection Time: 01/15/17 11:04 PM  Result Value Ref Range Status   MRSA by PCR NEGATIVE NEGATIVE Final    Comment:        The GeneXpert MRSA Assay (FDA approved for NASAL specimens only), is one component of a comprehensive MRSA colonization surveillance program. It is not intended to diagnose MRSA infection nor to guide or monitor treatment for MRSA infections.   Aerobic/Anaerobic  Culture (surgical/deep wound)     Status: None (Preliminary result)   Collection Time: 01/16/17  2:21 PM  Result Value Ref Range Status   Specimen Description ABSCESS ABDOMEN  Final   Special Requests INTRA ABDOMINAL ABSCESS  Final   Gram Stain   Final    ABUNDANT WBC PRESENT, PREDOMINANTLY PMN NO ORGANISMS SEEN    Culture NO GROWTH 1 DAY  Final  Report Status PENDING  Incomplete     Scheduled Meds: . chlorhexidine  15 mL Mouth Rinse BID  . Chlorhexidine Gluconate Cloth  6 each Topical Daily  . enoxaparin (LOVENOX) injection  40 mg Subcutaneous Q12H  . insulin aspart  0-20 Units Subcutaneous Q4H  . mouth rinse  15 mL Mouth Rinse q12n4p  . pneumococcal 23 valent vaccine  0.5 mL Intramuscular Tomorrow-1000  . sodium chloride flush  10-40 mL Intracatheter Q12H  . sodium chloride flush  5 mL Intravenous Q8H     LOS: 2 days   Lonia Blood, MD [facility] Office  [phone] Pager - Text Page per Loretha Stapler as per below:  On-Call/Text Page:      [email or web address]      password [email or web address]  If 7PM-7AM, please contact night-coverage [email or web address] Password [email or web address] 01/17/2017, 11:28 AM

## 2017-01-17 NOTE — Progress Notes (Signed)
Referring Physician(s):  Dr. Andrey CampanileWilson  Supervising Physician: Oley BalmHassell, Daniel  [facility]  Chief Complaint:  Abdominal abscess s/p drain placement 8/31 by Dr. Deanne CofferHassell  Subjective: Patient states still having abdominal pain; slight improvement after drain placement yesterday.  Allergies: Patient has no known allergies.  Medications: Prior to Admission medications   Medication Sig Start Date End Date Taking? Authorizing Provider  acetaminophen (TYLENOL) 325 MG tablet Take 2 tablets (650 mg total) by mouth every 8 (eight) hours. Patient taking differently: Take 650 mg by mouth every 8 (eight) hours as needed for moderate pain or headache.  02/27/16  Yes Mesner, Barbara CowerJason, MD  atorvastatin (LIPITOR) 10 MG tablet Take 10 mg by mouth every evening.  09/18/15  Yes [provider]  calcium carbonate (TUMS - DOSED IN MG ELEMENTAL CALCIUM) 500 MG chewable tablet Chew 500 mg by mouth as needed for heartburn.   Yes [provider]  nystatin (MYCOSTATIN) 100000 UNIT/ML suspension Take by mouth as needed. Thrush. 1 tsp 11/27/16  Yes [provider]  omeprazole (PRILOSEC) 20 MG capsule Take 20 mg by mouth 2 (two) times daily. 11/27/16  Yes [provider]  OVER THE COUNTER MEDICATION Apply 1 application topically as needed (eczema). [employer] Eczema Cream    Yes [provider]  oxyCODONE (ROXICODONE) 5 MG immediate release tablet Take 1 tablet (5 mg total) by mouth every 6 (six) hours as needed for severe pain. 03/05/16  Yes Ghimire, Werner LeanShanker M, MD  traZODone (DESYREL) 100 MG tablet Take 100 mg by mouth at bedtime.   Yes [provider]  ursodiol (ACTIGALL) 300 MG capsule Take 300 mg by mouth 2 (two) times daily. 11/27/16  Yes [provider]  cephALEXin (KEFLEX) 500 MG capsule Take 1 capsule (500 mg total) by mouth 3 (three) times daily. Patient not taking: Reported on 01/15/2017 03/05/16   Maretta BeesGhimire, Shanker M, MD  doxycycline  (VIBRA-TABS) 100 MG tablet Take 1 tablet (100 mg total) by mouth 2 (two) times daily. Patient not taking: Reported on 01/15/2017 03/05/16   Maretta BeesGhimire, Shanker M, MD  metFORMIN (GLUCOPHAGE) 500 MG tablet Take 2 tablets (1,000 mg total) by mouth 2 (two) times daily with a meal. Patient not taking: Reported on 01/15/2017 03/05/16   Maretta BeesGhimire, Shanker M, MD     Vital Signs: BP 138/87 (BP Location: Left Arm)   Pulse (!) 108   Temp 98.9 F (37.2 C) (Oral)   Resp 17   Ht 5\' 10"  (1.778 m)   Wt (!) 339 lb 14.4 oz (154.2 kg)   SpO2 94%   BMI 48.77 kg/m   Physical Exam  NAD, alert Abd:  Right lower quadrant drain in place with purulent output.  Insertion site c/d/i.  Abdomen tender.   Imaging: Dg Chest 2 View  Result Date: 01/15/2017 CLINICAL DATA:  Shortness of breath. EXAM: CHEST  2 VIEW COMPARISON:  Radiographs of March 04, 2012. FINDINGS: Stable cardiomediastinal silhouette. No pneumothorax is noted. Hypoinflation of the lungs is noted with mild bibasilar subsegmental atelectasis. Mild left pleural effusion is noted. Bony thorax is unremarkable. IMPRESSION: Hypoinflation of the lungs with mild bibasilar subsegmental atelectasis. Mild left pleural effusion is noted. Electronically Signed   By: Lupita RaiderJames  Green Jr, M.D.   On: 01/15/2017 16:09   Ct Abdomen Pelvis W Contrast  Result Date: 01/15/2017 CLINICAL DATA:  Abdominopelvic pain. Status post gastric bypass surgery 11/28/2016. Elevated alkaline phosphatase. EXAM: CT ABDOMEN AND PELVIS WITH CONTRAST TECHNIQUE: Multidetector CT imaging  of the abdomen and pelvis was performed using the standard protocol following bolus administration of intravenous contrast. CONTRAST:  ISOVUE-300 IOPAMIDOL (ISOVUE-300) INJECTION 61% COMPARISON:  04/16/2015 pelvic CT.  02/28/2012 CT abdomen/pelvis. FINDINGS: Lower chest: Small left and trace right dependent bilateral pleural effusions with moderate bibasilar atelectasis predominantly involving the lower lobes and  right middle lobe. Hepatobiliary: There is a large lentiform right perihepatic abscess measuring up to 20.0 x 11.2 cm with internal gas and with thick enhancing wall (series 3/image 17), which demonstrates prominent mass-effect on the right liver capsule. No liver mass. Normal gallbladder with no radiopaque cholelithiasis. No biliary ductal dilatation. Pancreas: Normal, with no mass or duct dilation. Spleen: There is a lentiform 12.1 x 2.4 cm thick walled fluid collection in the lateral perisplenic space (series 3/ image 26) with mass-effect on the lateral splenic capsule. Normal spleen size. No splenic mass. Adrenals/Urinary Tract: Normal adrenals. Nonobstructing 2 mm upper left renal stone. No hydronephrosis. Simple 1.8 cm renal cyst in the posterior lower left kidney. Otherwise normal kidneys, with no hydronephrosis. Normal bladder. Stomach/Bowel: Status post gastric bypass surgery. There is a gas containing abscess with thick enhancing wall measuring 10.7 x 5.0 cm abutting the posterior margin of the gastrojejunal anastomosis (series 3/ image 20). Smaller 4.3 x 2.5 cm fluid collection with thick enhancing wall along the greater curvature in the distal collapsed excluded stomach (series 3/image 32). No small bowel wall thickening. Mildly dilated mid small bowel loops with air-fluid levels, measuring up to 3.7 cm diameter. No discrete small bowel caliber transition. Appendix not discretely visualized. Mild sigmoid diverticulosis. No large bowel wall thickening. Vascular/Lymphatic: Atherosclerotic nonaneurysmal abdominal aorta. Patent hepatic, portal, splenic and renal veins. No pathologically enlarged lymph nodes in the abdomen or pelvis. Reproductive: Normal size prostate. Other: There is diffuse peritoneal thickening and hyperenhancement. There is a large loculated irregularly-shaped fluid collection conforming to the mid to lower peritoneal space measuring up to 27.8 x 19.6 cm (series 3/ image 75), which is  continuous with the right perihepatic space collection. Musculoskeletal: No aggressive appearing focal osseous lesions. Mild thoracolumbar spondylosis. IMPRESSION: 1. Gas containing 10.7 x 5.0 cm abscess abutting the posterior margin of the gastrojejunal anastomosis, worrisome for dehiscence of the gastrojejunal anastomosis. 2. Diffuse peritonitis with multiple large gas-containing abscesses throughout the peritoneal cavity, including a large right perihepatic abscess compressing the right liver lobe. 3. Mildly dilated mid small bowel loops with air-fluid levels, favor mild adynamic ileus. 4. Small left and trace right dependent bilateral pleural effusions with moderate bibasilar atelectasis. 5. Nonobstructing tiny left renal stone. 6.  Aortic Atherosclerosis (ICD10-I70.0). These results were called by telephone at the time of interpretation on 01/15/2017 at 12:41 pm to Dr. Alysia Penna , who verbally acknowledged these results. Electronically Signed   By: Delbert Phenix M.D.   On: 01/15/2017 12:42   Ct Image Guided Drainage By Percutaneous Catheter  Result Date: 01/16/2017 CLINICAL DATA:  Previous bariatric surgery with intraabdominal fluid collections. EXAM: CT GUIDED DRAINAGE OF PERITONEAL ABSCESS ANESTHESIA/SEDATION: Intravenous Fentanyl and Versed were administered as conscious sedation during continuous monitoring of the patient's level of consciousness and physiological / cardiorespiratory status by the radiology RN, with a total moderate sedation time of twelve minutes. PROCEDURE: The procedure, risks, benefits, and alternatives were explained to the patient. Questions regarding the procedure were encouraged and answered. The patient understands and consents to the procedure. Select axial scans through the abdomen were obtained. The fluid collection was localized and an appropriate skin entry site  was determined and marked. The operative field was prepped with chlorhexidinein a sterile fashion, and a  sterile drape was applied covering the operative field. A sterile gown and sterile gloves were used for the procedure. Local anesthesia was provided with 1% Lidocaine. Under CT fluoroscopic guidance, a 19 gauge percutaneous entry needle was advanced into the fluid. Cloudy thin fluid could be aspirated, and an Amplatz wire advanced easily within the collection, its position confirmed on CT. Tract was dilated to facilitate placement of a 14 French pigtail catheter, placed within the dependent aspect of the dominant component of the collection. 20 mL of the aspirate was sent for Gram stain and culture. Catheter secured externally with 0 Prolene suture and StatLock and placed to gravity drain bag. The patient tolerated the procedure well. COMPLICATIONS: None immediate FINDINGS: Large peritoneal fluid collection was again localized. 14French drain tube placed as above. Sample of the aspirate sent for Gram stain and culture. IMPRESSION: 1. Technically successful CT-guided right lower quadrant peritoneal abscess drain catheter placement Electronically Signed   By: Corlis Leak M.D.   On: 01/16/2017 13:34    Labs:  CBC:  Recent Labs  03/05/16 0702 01/15/17 1420 01/16/17 0411 01/17/17 0407  WBC 4.8 8.6 6.7 5.8  HGB 14.6 10.3* 9.5* 8.9*  HCT 42.3 33.1* 30.4* 29.0*  PLT 161 578* 514* 478*    COAGS:  Recent Labs  01/15/17 1420  INR 1.07    BMP:  Recent Labs  03/05/16 0702 01/15/17 1420 01/16/17 0411 01/17/17 0407  NA 136 133* 134* 135  K 4.1 3.9 3.9 3.6  CL 106 91* 96* 97*  CO2 20* 26 26 30   GLUCOSE 153* 121* 121* 160*  BUN 8 10 9  5*  CALCIUM 8.7* 8.8* 8.4* 8.0*  CREATININE 0.66 0.75 0.87 0.66  GFRNONAA >60 >60 >60 >60  GFRAA >60 >60 >60 >60    LIVER FUNCTION TESTS:  Recent Labs  01/15/17 1420 01/17/17 0407  BILITOT 1.1 0.7  AST 27 19  ALT 17 13*  ALKPHOS 240* 168*  PROT 7.0 5.8*  ALBUMIN 2.2* 1.7*    Assessment and Plan: Abdominal abscess s/p drain placement by Dr.  Deanne Coffer 01/16/17 Patient states some improvement in abdominal pain.   Currently on TPN.  Drain continues with tan, purulent output.  Cultures pending.  Continue drain care. IR to follow.  Electronically Signed: Hoyt Koch, PA 01/17/2017, 3:28 PM   I spent a total of 15 Minutes at the the patient's bedside AND on the patient's hospital floor or unit, greater than 50% of which was counseling/coordinating care for abdominal abscess

## 2017-01-17 NOTE — Progress Notes (Addendum)
[facility] NOTE   Pharmacy Consult for TPN Indication: Prolonged NPO  Patient Measurements: Height: '5\' 10"'  (177.8 cm) Weight: (!) 339 lb 14.4 oz (154.2 kg) IBW/kg (Calculated) : 73 TPN AdjBW (KG): 93.4 Body mass index is 48.77 kg/m.  Assessment:  41 year old obese male s/p laparoscopic Roux-en-Y/gastric bypass surgery on 12/08/16 and hospitalization 8/14 through 01/10/17 for anasarca, abdominal pain, fever, and postoperative seroma with percutaneous drain placement now re-admitted with continued abdominal pain, occasional nausea, non-bloddy emesis, weight loss (on Lasix), and decreased appetite. CT of abdomen revealed multiple large gas containing intra-abdominal abscesses and concern for dehiscence.   GI: Multiple intra-abdominal abscesses and peritonitis, Anasarca/volume overload with percutaneous drain placed 8/31. Drained >2L of purulent fluid. May need second drain posterior to leaking anastomosis. Albumin is low at 2.2. Pre-albumin 5.1. Last BM was 01/15/17. Pepcid IV.  Endo: DM2, CBGs 134-160 on TPN with ICU SSI q4h. Trending up.  Insulin requirements in the past 24 hours: 8 units Lytes: Na and Cl improving. K 3.6. Mg 1.9. Phos 3.8, CoCa 9.8.  Renal: SCr 0.66. Est CrCl ~ 180 ml/min. BUN 5. I/O + 825 cc. UOP low at 0.2 cc/kg/hr. I/O 3.6L negative.  IVF - NS at 100 ml/hr.  Pulm: 2L [place] (CPAP at home [facility]) Cards:HLD,  BP improving. ST- low 100s.  Hepatobil: Alk Phos trending down. AST and ALT are normal. Tbili is normal. TG are 135.   Neuro: GCS is 15. RASS is 0. Pain is 7-8 out of 10. IV Dilaudid prn.  ID: Zosyn for peritonitis - day# 3. WBC within normal limits. Afebrile. Lactic acid normal on admission.   Best Practices: Lovenox 40 BID TPN Access: Double lumen PICC 01/16/17 TPN start date: 01/16/17  Nutritional Goals (per RD recommendation 8/31): KCal: 9563-8756 Protein: 115-130 g   Current Nutrition:  NPO, initiating TPN  Plan:   Increase Clinimix E 5/15 to 29m/hr  Continue 20% lipid emulsion at 220mhr x12 hrs Decrease NS to 35 ml/hr when TPN hung at 1800 PM.  This provides 78g of protein and 1587 kCals per day meeting ~68% of protein and ~69% of kcal needs.  KCl 1057mx2 runs today to keep K at goal Add MVI and trace elements in TPN. Add Pepcid 81m53m TPN bag. Change to Resistant SSI q4h.  Monitor TPN labs, watch TG level  F/U volume status and need to adjust fluid rates   JessSloan LeiterarmD, BCPS Clinical Pharmacist Clinical phone 01/17/2017 until 3:30PM - #[phone] hours, please call #[phone] 01/17/2017,7:38 AM

## 2017-01-17 NOTE — Progress Notes (Signed)
VASCULAR LAB PRELIMINARY  PRELIMINARY  PRELIMINARY  PRELIMINARY  Bilateral lower extremity venous duplex completed.    Preliminary report:  There is no obvious evidence of DVT or SVT noted in the visualized veins of the bilateral lower extremities.   Albert White, RVT 01/17/2017, 2:45 PM

## 2017-01-17 NOTE — Progress Notes (Signed)
  Progress Note: General Surgery Service   Assessment/Plan: Patient Active Problem List   Diagnosis Date Noted  . Postprocedural intraabdominal abscess 01/15/2017  . DM (diabetes mellitus) (HCC) 02/29/2016  . HLD (hyperlipidemia) 02/29/2016  . Cellulitis and abscess of neck 02/29/2016  . Cellulitis 12/14/2013  . Morbid obesity (HCC) 12/14/2013  . Hidradenitis suppurativa, neck and scalp 08/09/2012   s/p perc drain. Large amount output today -continue drain -continue TPN -continue abx -up to chair -may be ok to move to surgical floor -will discuss with radiology tomorrow about possible reevaluation    LOS: 2 days  Chief Complaint/Subjective: Continued pain but somewhat improved, no vomiting overnight  Objective: Vital signs in last 24 hours: Temp:  [97.8 F (36.6 C)-98.9 F (37.2 C)] 98.9 F (37.2 C) (09/01 0352) Pulse Rate:  [105-127] 108 (09/01 0352) Resp:  [12-22] 17 (09/01 0352) BP: (124-141)/(67-99) 138/87 (09/01 0352) SpO2:  [90 %-95 %] 94 % (09/01 0352) Weight:  [154.2 kg (339 lb 14.4 oz)] 154.2 kg (339 lb 14.4 oz) (09/01 0408) Last BM Date: 01/15/17  Intake/Output from previous day: 08/31 0701 - 09/01 0700 In: 1800 [I.V.:1550; IV Piggyback:250] Out: 5425 [Urine:825; Drains:1200] Intake/Output this shift: No intake/output data recorded.   Abd: soft, tender over left side, drain with thick brown fluid in bag  Extremities: no edema  Neuro: AOx4  Lab Results: CBC   Recent Labs  01/16/17 0411 01/17/17 0407  WBC 6.7 5.8  HGB 9.5* 8.9*  HCT 30.4* 29.0*  PLT 514* 478*   BMET  Recent Labs  01/16/17 0411 01/17/17 0407  NA 134* 135  K 3.9 3.6  CL 96* 97*  CO2 26 30  GLUCOSE 121* 160*  BUN 9 5*  CREATININE 0.87 0.66  CALCIUM 8.4* 8.0*   PT/INR  Recent Labs  01/15/17 1420  LABPROT 13.8  INR 1.07   ABG No results for input(s): PHART, HCO3 in the last 72 hours.  Invalid input(s): PCO2,  PO2  Studies/Results:  Anti-infectives: Anti-infectives    Start     Dose/Rate Route Frequency Ordered Stop   01/15/17 1815  piperacillin-tazobactam (ZOSYN) IVPB 3.375 g  Status:  Discontinued     3.375 g 12.5 mL/hr over 240 Minutes Intravenous Every 8 hours 01/15/17 1804 01/15/17 1804   01/15/17 1630  piperacillin-tazobactam (ZOSYN) IVPB 3.375 g     3.375 g 12.5 mL/hr over 240 Minutes Intravenous Every 8 hours 01/15/17 1616        Medications: Scheduled Meds: . chlorhexidine  15 mL Mouth Rinse BID  . Chlorhexidine Gluconate Cloth  6 each Topical Daily  . enoxaparin (LOVENOX) injection  40 mg Subcutaneous Q12H  . insulin aspart  2-6 Units Subcutaneous Q4H  . mouth rinse  15 mL Mouth Rinse q12n4p  . pneumococcal 23 valent vaccine  0.5 mL Intramuscular Tomorrow-1000  . sodium chloride flush  10-40 mL Intracatheter Q12H  . sodium chloride flush  5 mL Intravenous Q8H   Continuous Infusions: . sodium chloride 60 mL/hr at 01/17/17 0405  . piperacillin-tazobactam (ZOSYN)  IV 3.375 g (01/17/17 0546)  . Marland Kitchen.TPN (CLINIMIX-E) Adult 40 mL/hr at 01/16/17 1712   PRN Meds:.albuterol, [DISCONTINUED] diphenhydrAMINE **OR** diphenhydrAMINE, HYDROmorphone (DILAUDID) injection, ondansetron **OR** ondansetron (ZOFRAN) IV, sodium chloride flush  Rodman PickleLuke Aaron Orabelle Rylee, MD Pg# [phone] [facility]

## 2017-01-18 LAB — GLUCOSE, CAPILLARY
GLUCOSE-CAPILLARY: 161 mg/dL — AB (ref 65–99)
Glucose-Capillary: 131 mg/dL — ABNORMAL HIGH (ref 65–99)
Glucose-Capillary: 146 mg/dL — ABNORMAL HIGH (ref 65–99)
Glucose-Capillary: 150 mg/dL — ABNORMAL HIGH (ref 65–99)
Glucose-Capillary: 157 mg/dL — ABNORMAL HIGH (ref 65–99)
Glucose-Capillary: 160 mg/dL — ABNORMAL HIGH (ref 65–99)

## 2017-01-18 LAB — BASIC METABOLIC PANEL
Anion gap: 6 (ref 5–15)
BUN: 5 mg/dL — AB (ref 6–20)
CALCIUM: 7.8 mg/dL — AB (ref 8.9–10.3)
CHLORIDE: 99 mmol/L — AB (ref 101–111)
CO2: 31 mmol/L (ref 22–32)
CREATININE: 0.54 mg/dL — AB (ref 0.61–1.24)
GFR calc Af Amer: >60 mL/min (ref >60)
GFR calc non Af Amer: >60 mL/min (ref >60)
Glucose, Bld: 154 mg/dL — ABNORMAL HIGH (ref 65–99)
Potassium: 3.7 mmol/L (ref 3.5–5.1)
SODIUM: 136 mmol/L (ref 135–145)

## 2017-01-18 LAB — MAGNESIUM: MAGNESIUM: 1.8 mg/dL (ref 1.7–2.4)

## 2017-01-18 LAB — PHOSPHORUS: PHOSPHORUS: 4.1 mg/dL (ref 2.5–4.6)

## 2017-01-18 MED ORDER — FAT EMULSION 20 % IV EMUL
240.0000 mL | INTRAVENOUS | Status: AC
  Administered 2017-01-18: 240 mL via INTRAVENOUS
  Filled 2017-01-18: qty 250

## 2017-01-18 MED ORDER — HYDROCODONE-ACETAMINOPHEN 5-325 MG PO TABS
1.0000 | ORAL_TABLET | ORAL | Status: DC | PRN
  Administered 2017-01-23 – 2017-01-25 (×2): 2 via ORAL
  Filled 2017-01-18 (×2): qty 2

## 2017-01-18 MED ORDER — TRACE MINERALS CR-CU-MN-SE-ZN 10-1000-500-60 MCG/ML IV SOLN
INTRAVENOUS | Status: AC
  Administered 2017-01-18: 18:00:00 via INTRAVENOUS
  Filled 2017-01-18: qty 1992

## 2017-01-18 MED ORDER — ACETAMINOPHEN 10 MG/ML IV SOLN
1000.0000 mg | Freq: Four times a day (QID) | INTRAVENOUS | Status: AC
  Administered 2017-01-18 – 2017-01-19 (×4): 1000 mg via INTRAVENOUS
  Filled 2017-01-18 (×4): qty 100

## 2017-01-18 MED ORDER — WHITE PETROLATUM GEL
Status: AC
  Administered 2017-01-18: 1
  Filled 2017-01-18: qty 1

## 2017-01-18 NOTE — Progress Notes (Signed)
Received patient , alert and oriented, not in any distress, lines intact. Oriented to unit and staff. Will continue to monitor.

## 2017-01-18 NOTE — Progress Notes (Signed)
Progress Note: General Surgery Service   Assessment/Plan: Patient Active Problem List   Diagnosis Date Noted  . Postprocedural intraabdominal abscess 01/15/2017  . DM (diabetes mellitus) (HCC) 02/29/2016  . HLD (hyperlipidemia) 02/29/2016  . Cellulitis and abscess of neck 02/29/2016  . Cellulitis 12/14/2013  . Morbid obesity (HCC) 12/14/2013  . Hidradenitis suppurativa, neck and scalp 08/09/2012   s/p  Perc drain 8/31 -continue TPN -transfer to floor -up to chair -discussed with Dr. Deanne Coffer about the fluid collection next to the GJ anastomosis and whether that is able to be drained. He noted it is high up but likely has a potential window. Since his SIRS continues to improve, we have made a plan to repeat CT scan tomorrow evening for possible intervention Tuesday    LOS: 3 days  Chief Complaint/Subjective: Pain improved, continued to have LUQ type pain, no nausea or vomiting  Objective: Vital signs in last 24 hours: Temp:  [98.1 F (36.7 C)-98.8 F (37.1 C)] 98.8 F (37.1 C) (09/02 0736) Pulse Rate:  [98-101] 101 (09/02 0751) Resp:  [15-21] 15 (09/02 0751) BP: (106-139)/(69-93) 131/88 (09/02 0736) SpO2:  [95 %-98 %] 97 % (09/02 0751) Weight:  [154.8 kg (341 lb 4.8 oz)] 154.8 kg (341 lb 4.8 oz) (09/02 0412) Last BM Date: 01/15/17  Intake/Output from previous day: 09/01 0701 - 09/02 0700 In: 2768.9 [I.V.:2768.9] Out: 1250 [Urine:775; Drains:475] Intake/Output this shift: Total I/O In: -  Out: 75 [Drains:75]  Lungs: CTAB  Cardiovascular: RRR  Abd: soft, TTP LUQ  Extremities: no edema  Neuro: AOx4  Lab Results: CBC   Recent Labs  01/16/17 0411 01/17/17 0407  WBC 6.7 5.8  HGB 9.5* 8.9*  HCT 30.4* 29.0*  PLT 514* 478*   BMET  Recent Labs  01/17/17 0407 01/18/17 0520  NA 135 136  K 3.6 3.7  CL 97* 99*  CO2 30 31  GLUCOSE 160* 154*  BUN 5* 5*  CREATININE 0.66 0.54*  CALCIUM 8.0* 7.8*   PT/INR  Recent Labs  01/15/17 1420  LABPROT 13.8    INR 1.07   ABG No results for input(s): PHART, HCO3 in the last 72 hours.  Invalid input(s): PCO2, PO2  Studies/Results:  Anti-infectives: Anti-infectives    Start     Dose/Rate Route Frequency Ordered Stop   01/15/17 1815  piperacillin-tazobactam (ZOSYN) IVPB 3.375 g  Status:  Discontinued     3.375 g 12.5 mL/hr over 240 Minutes Intravenous Every 8 hours 01/15/17 1804 01/15/17 1804   01/15/17 1630  piperacillin-tazobactam (ZOSYN) IVPB 3.375 g     3.375 g 12.5 mL/hr over 240 Minutes Intravenous Every 8 hours 01/15/17 1616        Medications: Scheduled Meds: . chlorhexidine  15 mL Mouth Rinse BID  . Chlorhexidine Gluconate Cloth  6 each Topical Daily  . enoxaparin (LOVENOX) injection  40 mg Subcutaneous Q12H  . insulin aspart  0-20 Units Subcutaneous Q4H  . mouth rinse  15 mL Mouth Rinse q12n4p  . pneumococcal 23 valent vaccine  0.5 mL Intramuscular Tomorrow-1000  . sodium chloride flush  10-40 mL Intracatheter Q12H  . sodium chloride flush  5 mL Intravenous Q8H   Continuous Infusions: . sodium chloride 35 mL/hr at 01/17/17 2219  . Marland KitchenTPN (CLINIMIX-E) Adult     And  . fat emulsion    . piperacillin-tazobactam (ZOSYN)  IV 3.375 g (01/18/17 0507)  . Marland KitchenTPN (CLINIMIX-E) Adult 65 mL/hr at 01/17/17 1755   PRN Meds:.[DISCONTINUED] diphenhydrAMINE **OR** diphenhydrAMINE, HYDROmorphone (DILAUDID) injection, levalbuterol,  ondansetron **OR** ondansetron (ZOFRAN) IV, sodium chloride flush, sodium chloride flush  Rodman PickleLuke Aaron Inis Borneman, MD Pg# [phone] [facility]

## 2017-01-18 NOTE — Consult Note (Signed)
TEAM 1 - Stepdown/ICU TEAM CONSULT F/U Note  Albert PetrinSteven T White  RUE:[id number]:[id number] DOB: [birth year] DOA: 01/15/2017 PCP: Alysia PennaHolwerda, Scott, MD    Brief Narrative:  41 year old male w/ DM2, HLD, and OSA on CPAP who is s/p laparoscopic Roux-en-Y/[place]. Franki Cabothomas Walsh, rehospitalized 12/30/16-01/10/17 for anasarca, abdominal pain, fever of 103F and postoperative seroma for which a percutaneous drain was placed.  He was aggressively diuresed with IV Lasix and reportedly lost 20 pounds, had ongoing abdominal pain even while hospitalized.  Following d/c home he noted ongoing lower abdominal pain, occasional nausea, nonbloody emesis, and decreased appetite. He was seen in follow-up by his PCP 8/29 who ordered labs and CT abdomen. CT abdomen showed gas containing 10.7 x 5 cm abscess abutting the posterior margin of the GJ anastomosis worrisome for dehiscence, diffuse peritonitis with large gas containing abscess throughout the peritoneal cavity including a right perihepatic abscess compressing the right liver lobe, ileus, small left and trace right dependent bilateral pleural effusions with moderate atelectasis.   Subjective: The patient states he is feeling much better today.  His spirits have certainly improved and he is more animated today.  He states his pain is controlled.  He is less tachycardic.  There is no evidence respiratory distress.  Assessment & Plan:  Sepsis due to postoperative intra-abdominal abscesses and peritonitis On empiric abx coverage - ongoing care per General Surgery - sepsis physiology improving w/ signif decrease in tachycardia   Dyspnea Due to morbid obesity, OSA, atelectasis, small bilateral pleural effusions and deconditioning - improving - not significantly hypoxic - has resolved as of today - wean O2 as able   Sinus tachycardia  Improving - likely due to sepsis/infection - lower extremity venous duplex negative for DVT - no  chronic beta blocker use per review of records  DM 2 Reasonably controlled at this time on TNA  OSA Continue nightly CPAP as patient will allow  Normocytic Anemia Fe studies most c/w anemia related to poor nutritional status/chornic inflammation w/ TIBC very low - avoid IV Fe supplementation for now given signif infection - TNA per Pharm   HLD  Morbid obesity - Body mass index is 48.97 kg/m.   DVT prophylaxis: lovenox  Code Status: FULL CODE Family Communication: no family present at time of exam   Procedures: 8/31 CT guided abscess drain placement   Antimicrobials:  Zosyn 8/30 >  Objective: Blood pressure 129/80, pulse 98, temperature 98.8 F (37.1 C), temperature source Oral, resp. rate 18, height 5\' 10"  (1.778 m), weight (!) 154.8 kg (341 lb 4.8 oz), SpO2 95 %.  Intake/Output Summary (Last 24 hours) at 01/18/17 1449 Last data filed at 01/18/17 0740  Gross per 24 hour  Intake          1753.92 ml  Output              900 ml  Net           853.92 ml   Filed Weights   01/16/17 0612 01/17/17 0408 01/18/17 0412  Weight: (!) 157.4 kg (346 lb 14.4 oz) (!) 154.2 kg (339 lb 14.4 oz) (!) 154.8 kg (341 lb 4.8 oz)    Examination: General: No acute respiratory distress - alert and pleasant  Lungs: Clear to auscultation bilaterally w/o wheezing   Cardiovascular: RRR w/o gallup or rub   Abdomen: Morbidly obese, soft, bowel sounds positive, no rebound  Extremities: 1+ bilateral lower extremity edema w/o change   CBC:  Recent Labs Lab 01/15/17 1420 01/16/17 0411 01/17/17 0407  WBC 8.6 6.7 5.8  NEUTROABS 6.3  --  4.1  HGB 10.3* 9.5* 8.9*  HCT 33.1* 30.4* 29.0*  MCV 86.2 86.6 86.3  PLT 578* 514* 478*   Basic Metabolic Panel:  Recent Labs Lab 01/15/17 1420 01/16/17 0411 01/16/17 0950 01/17/17 0407 01/18/17 0520  NA 133* 134*  --  135 136  K 3.9 3.9  --  3.6 3.7  CL 91* 96*  --  97* 99*  CO2 26 26  --  30 31  GLUCOSE 121* 121*  --  160* 154*  BUN 10 9  --   5* 5*  CREATININE 0.75 0.87  --  0.66 0.54*  CALCIUM 8.8* 8.4*  --  8.0* 7.8*  MG  --  1.8  --  1.9 1.8  PHOS  --   --  4.9* 3.8 4.1   GFR: Estimated Creatinine Clearance: 183.5 mL/min (A) (by C-G formula based on SCr of 0.54 mg/dL (L)).  Liver Function Tests:  Recent Labs Lab 01/15/17 1420 01/17/17 0407  AST 27 19  ALT 17 13*  ALKPHOS 240* 168*  BILITOT 1.1 0.7  PROT 7.0 5.8*  ALBUMIN 2.2* 1.7*    Coagulation Profile:  Recent Labs Lab 01/15/17 1420  INR 1.07    HbA1C: Hgb A1c MFr Bld  Date/Time Value Ref Range Status  02/29/2016 11:36 AM 8.6 (H) 4.8 - 5.6 % Final    Comment:    (NOTE)         Pre-diabetes: 5.7 - 6.4         Diabetes: >6.4         Glycemic control for adults with diabetes: <7.0     CBG:  Recent Labs Lab 01/17/17 1554 01/17/17 1958 01/18/17 0006 01/18/17 0410 01/18/17 0836  GLUCAP 133* 153* 157* 146* 160*    Recent Results (from the past 240 hour(s))  Culture, blood (Routine x 2)     Status: None (Preliminary result)   Collection Time: 01/15/17  2:20 PM  Result Value Ref Range Status   Specimen Description BLOOD LEFT HAND  Final   Special Requests   Final    BOTTLES DRAWN AEROBIC AND ANAEROBIC Blood Culture adequate volume   Culture NO GROWTH 3 DAYS  Final   Report Status PENDING  Incomplete  Culture, blood (Routine x 2)     Status: None (Preliminary result)   Collection Time: 01/15/17  3:25 PM  Result Value Ref Range Status   Specimen Description BLOOD RIGHT HAND  Final   Special Requests   Final    BOTTLES DRAWN AEROBIC AND ANAEROBIC Blood Culture adequate volume   Culture NO GROWTH 3 DAYS  Final   Report Status PENDING  Incomplete  MRSA PCR Screening     Status: None   Collection Time: 01/15/17 11:04 PM  Result Value Ref Range Status   MRSA by PCR NEGATIVE NEGATIVE Final    Comment:        The GeneXpert MRSA Assay (FDA approved for NASAL specimens only), is one component of a comprehensive MRSA  colonization surveillance program. It is not intended to diagnose MRSA infection nor to guide or monitor treatment for MRSA infections.   Aerobic/Anaerobic Culture (surgical/deep wound)     Status: None (Preliminary result)   Collection Time: 01/16/17  2:21 PM  Result Value Ref Range Status   Specimen Description ABSCESS ABDOMEN  Final   Special Requests INTRA ABDOMINAL ABSCESS  Final  Gram Stain   Final    ABUNDANT WBC PRESENT, PREDOMINANTLY PMN NO ORGANISMS SEEN    Culture   Final    CULTURE REINCUBATED FOR BETTER GROWTH HOLDING FOR POSSIBLE ANAEROBE    Report Status PENDING  Incomplete     Scheduled Meds: . chlorhexidine  15 mL Mouth Rinse BID  . Chlorhexidine Gluconate Cloth  6 each Topical Daily  . enoxaparin (LOVENOX) injection  40 mg Subcutaneous Q12H  . insulin aspart  0-20 Units Subcutaneous Q4H  . mouth rinse  15 mL Mouth Rinse q12n4p  . pneumococcal 23 valent vaccine  0.5 mL Intramuscular Tomorrow-1000  . sodium chloride flush  10-40 mL Intracatheter Q12H  . sodium chloride flush  5 mL Intravenous Q8H     LOS: 3 days   Lonia Blood, MD [facility] Office  [phone] Pager - Text Page per Loretha Stapler as per below:  On-Call/Text Page:      [email or web address]      password [email or web address]  If 7PM-7AM, please contact night-coverage [email or web address] Password [email or web address] 01/18/2017, 2:49 PM

## 2017-01-18 NOTE — Progress Notes (Signed)
Rt pulled cpap due to intolerance.  RT placed on lowest setting and pt says it is too much pressure.  Rt replaced pt on [place] and pt stated he would sleep with that instead of the cpap.  RT will monitor.

## 2017-01-18 NOTE — Progress Notes (Signed)
Referring Physician(s):  Dr. Andrey Campanile  Supervising Physician: Oley Balm  Patient Status:  [facility] - In-pt  Chief Complaint:  Abdominal abscess s/p drain placement 8/31 by Dr. Deanne Coffer  Subjective: Patient continues with abdominal pain.  Has multiple abscesses based on prior imaging.   Allergies: Patient has no known allergies.  Medications: Prior to Admission medications   Medication Sig Start Date End Date Taking? Authorizing Provider  acetaminophen (TYLENOL) 325 MG tablet Take 2 tablets (650 mg total) by mouth every 8 (eight) hours. Patient taking differently: Take 650 mg by mouth every 8 (eight) hours as needed for moderate pain or headache.  02/27/16  Yes Mesner, Barbara Cower, MD  atorvastatin (LIPITOR) 10 MG tablet Take 10 mg by mouth every evening.  09/18/15  Yes [provider]  calcium carbonate (TUMS - DOSED IN MG ELEMENTAL CALCIUM) 500 MG chewable tablet Chew 500 mg by mouth as needed for heartburn.   Yes [provider]  nystatin (MYCOSTATIN) 100000 UNIT/ML suspension Take by mouth as needed. Thrush. 1 tsp 11/27/16  Yes [provider]  omeprazole (PRILOSEC) 20 MG capsule Take 20 mg by mouth 2 (two) times daily. 11/27/16  Yes [provider]  OVER THE COUNTER MEDICATION Apply 1 application topically as needed (eczema). [employer] Eczema Cream    Yes [provider]  oxyCODONE (ROXICODONE) 5 MG immediate release tablet Take 1 tablet (5 mg total) by mouth every 6 (six) hours as needed for severe pain. 03/05/16  Yes Ghimire, Werner Lean, MD  traZODone (DESYREL) 100 MG tablet Take 100 mg by mouth at bedtime.   Yes [provider]  ursodiol (ACTIGALL) 300 MG capsule Take 300 mg by mouth 2 (two) times daily. 11/27/16  Yes [provider]  cephALEXin (KEFLEX) 500 MG capsule Take 1 capsule (500 mg total) by mouth 3 (three) times daily. Patient not taking: Reported on 01/15/2017 03/05/16   Maretta Bees, MD  doxycycline  (VIBRA-TABS) 100 MG tablet Take 1 tablet (100 mg total) by mouth 2 (two) times daily. Patient not taking: Reported on 01/15/2017 03/05/16   Maretta Bees, MD  metFORMIN (GLUCOPHAGE) 500 MG tablet Take 2 tablets (1,000 mg total) by mouth 2 (two) times daily with a meal. Patient not taking: Reported on 01/15/2017 03/05/16   Maretta Bees, MD     Vital Signs: BP 129/80   Pulse 98   Temp 98.8 F (37.1 C) (Oral)   Resp 18   Ht 5\' 10"  (1.778 m)   Wt (!) 341 lb 4.8 oz (154.8 kg)   SpO2 95%   BMI 48.97 kg/m   Physical Exam  NAD, alert Abd:  Right lower quadrant drain in place with purulent output.  Insertion site c/d/i.  Abdomen tender.   Imaging: Dg Chest 2 View  Result Date: 01/15/2017 CLINICAL DATA:  Shortness of breath. EXAM: CHEST  2 VIEW COMPARISON:  Radiographs of March 04, 2012. FINDINGS: Stable cardiomediastinal silhouette. No pneumothorax is noted. Hypoinflation of the lungs is noted with mild bibasilar subsegmental atelectasis. Mild left pleural effusion is noted. Bony thorax is unremarkable. IMPRESSION: Hypoinflation of the lungs with mild bibasilar subsegmental atelectasis. Mild left pleural effusion is noted. Electronically Signed   By: Lupita Raider, M.D.   On: 01/15/2017 16:09   Ct Abdomen Pelvis W Contrast  Result Date: 01/15/2017 CLINICAL DATA:  Abdominopelvic pain. Status post gastric bypass surgery 11/28/2016. Elevated alkaline phosphatase. EXAM: CT ABDOMEN AND PELVIS WITH CONTRAST TECHNIQUE: Multidetector CT imaging of the abdomen  and pelvis was performed using the standard protocol following bolus administration of intravenous contrast. CONTRAST:  ISOVUE-300 IOPAMIDOL (ISOVUE-300) INJECTION 61% COMPARISON:  04/16/2015 pelvic CT.  02/28/2012 CT abdomen/pelvis. FINDINGS: Lower chest: Small left and trace right dependent bilateral pleural effusions with moderate bibasilar atelectasis predominantly involving the lower lobes and right middle lobe.  Hepatobiliary: There is a large lentiform right perihepatic abscess measuring up to 20.0 x 11.2 cm with internal gas and with thick enhancing wall (series 3/image 17), which demonstrates prominent mass-effect on the right liver capsule. No liver mass. Normal gallbladder with no radiopaque cholelithiasis. No biliary ductal dilatation. Pancreas: Normal, with no mass or duct dilation. Spleen: There is a lentiform 12.1 x 2.4 cm thick walled fluid collection in the lateral perisplenic space (series 3/ image 26) with mass-effect on the lateral splenic capsule. Normal spleen size. No splenic mass. Adrenals/Urinary Tract: Normal adrenals. Nonobstructing 2 mm upper left renal stone. No hydronephrosis. Simple 1.8 cm renal cyst in the posterior lower left kidney. Otherwise normal kidneys, with no hydronephrosis. Normal bladder. Stomach/Bowel: Status post gastric bypass surgery. There is a gas containing abscess with thick enhancing wall measuring 10.7 x 5.0 cm abutting the posterior margin of the gastrojejunal anastomosis (series 3/ image 20). Smaller 4.3 x 2.5 cm fluid collection with thick enhancing wall along the greater curvature in the distal collapsed excluded stomach (series 3/image 32). No small bowel wall thickening. Mildly dilated mid small bowel loops with air-fluid levels, measuring up to 3.7 cm diameter. No discrete small bowel caliber transition. Appendix not discretely visualized. Mild sigmoid diverticulosis. No large bowel wall thickening. Vascular/Lymphatic: Atherosclerotic nonaneurysmal abdominal aorta. Patent hepatic, portal, splenic and renal veins. No pathologically enlarged lymph nodes in the abdomen or pelvis. Reproductive: Normal size prostate. Other: There is diffuse peritoneal thickening and hyperenhancement. There is a large loculated irregularly-shaped fluid collection conforming to the mid to lower peritoneal space measuring up to 27.8 x 19.6 cm (series 3/ image 75), which is continuous with the  right perihepatic space collection. Musculoskeletal: No aggressive appearing focal osseous lesions. Mild thoracolumbar spondylosis. IMPRESSION: 1. Gas containing 10.7 x 5.0 cm abscess abutting the posterior margin of the gastrojejunal anastomosis, worrisome for dehiscence of the gastrojejunal anastomosis. 2. Diffuse peritonitis with multiple large gas-containing abscesses throughout the peritoneal cavity, including a large right perihepatic abscess compressing the right liver lobe. 3. Mildly dilated mid small bowel loops with air-fluid levels, favor mild adynamic ileus. 4. Small left and trace right dependent bilateral pleural effusions with moderate bibasilar atelectasis. 5. Nonobstructing tiny left renal stone. 6.  Aortic Atherosclerosis (ICD10-I70.0). These results were called by telephone at the time of interpretation on 01/15/2017 at 12:41 pm to Dr. Alysia Penna , who verbally acknowledged these results. Electronically Signed   By: Delbert Phenix M.D.   On: 01/15/2017 12:42   Ct Image Guided Drainage By Percutaneous Catheter  Result Date: 01/16/2017 CLINICAL DATA:  Previous bariatric surgery with intraabdominal fluid collections. EXAM: CT GUIDED DRAINAGE OF PERITONEAL ABSCESS ANESTHESIA/SEDATION: Intravenous Fentanyl and Versed were administered as conscious sedation during continuous monitoring of the patient's level of consciousness and physiological / cardiorespiratory status by the radiology RN, with a total moderate sedation time of twelve minutes. PROCEDURE: The procedure, risks, benefits, and alternatives were explained to the patient. Questions regarding the procedure were encouraged and answered. The patient understands and consents to the procedure. Select axial scans through the abdomen were obtained. The fluid collection was localized and an appropriate skin entry site was determined and  marked. The operative field was prepped with chlorhexidinein a sterile fashion, and a sterile drape was  applied covering the operative field. A sterile gown and sterile gloves were used for the procedure. Local anesthesia was provided with 1% Lidocaine. Under CT fluoroscopic guidance, a 19 gauge percutaneous entry needle was advanced into the fluid. Cloudy thin fluid could be aspirated, and an Amplatz wire advanced easily within the collection, its position confirmed on CT. Tract was dilated to facilitate placement of a 14 French pigtail catheter, placed within the dependent aspect of the dominant component of the collection. 20 mL of the aspirate was sent for Gram stain and culture. Catheter secured externally with 0 Prolene suture and StatLock and placed to gravity drain bag. The patient tolerated the procedure well. COMPLICATIONS: None immediate FINDINGS: Large peritoneal fluid collection was again localized. 14French drain tube placed as above. Sample of the aspirate sent for Gram stain and culture. IMPRESSION: 1. Technically successful CT-guided right lower quadrant peritoneal abscess drain catheter placement Electronically Signed   By: Corlis Leak  Hassell M.D.   On: 01/16/2017 13:34    Labs:  CBC:  Recent Labs  03/05/16 0702 01/15/17 1420 01/16/17 0411 01/17/17 0407  WBC 4.8 8.6 6.7 5.8  HGB 14.6 10.3* 9.5* 8.9*  HCT 42.3 33.1* 30.4* 29.0*  PLT 161 578* 514* 478*    COAGS:  Recent Labs  01/15/17 1420  INR 1.07    BMP:  Recent Labs  01/15/17 1420 01/16/17 0411 01/17/17 0407 01/18/17 0520  NA 133* 134* 135 136  K 3.9 3.9 3.6 3.7  CL 91* 96* 97* 99*  CO2 26 26 30 31   GLUCOSE 121* 121* 160* 154*  BUN 10 9 5* 5*  CALCIUM 8.8* 8.4* 8.0* 7.8*  CREATININE 0.75 0.87 0.66 0.54*  GFRNONAA >60 >60 >60 >60  GFRAA >60 >60 >60 >60    LIVER FUNCTION TESTS:  Recent Labs  01/15/17 1420 01/17/17 0407  BILITOT 1.1 0.7  AST 27 19  ALT 17 13*  ALKPHOS 240* 168*  PROT 7.0 5.8*  ALBUMIN 2.2* 1.7*    Assessment and Plan: Abdominal abscess s/p drain placement by Dr. Deanne CofferHassell  01/16/17 Drain continues with tan, purulent output. Cultures pending.  Plan is for CT Abd/Pelvis tomorrow with possible drain placement Tuesday if amenable.  Remains NPO- on TPN. Will need to hold lovenox Monday night for potential drain.  Continue drain care. IR to follow.  Electronically Signed: Hoyt KochKacie Sue-Ellen Matthews, PA 01/18/2017, 2:16 PM   I spent a total of 15 Minutes at the the patient's bedside AND on the patient's hospital floor or unit, greater than 50% of which was counseling/coordinating care for abdominal abscess

## 2017-01-18 NOTE — Progress Notes (Signed)
Albert White NOTE   Pharmacy Consult for TPN Indication: Prolonged NPO  Patient Measurements: Height: _0  (177.8 cm) Weight: (!) 341 lb 4.8 oz (154.8 kg) IBW/kg (Calculated) : 73 TPN AdjBW (KG): 93.4 Body mass index is 48.97 kg/m.  Assessment:  41 year old obese male s/p laparoscopic Roux-en-Y/gastric bypass surgery on 12/08/16 and hospitalization 8/14 through 01/10/17 for anasarca, abdominal pain, fever, and postoperative seroma with percutaneous drain placement now re-admitted with continued abdominal pain, occasional nausea, non-bloddy emesis, weight loss (on Lasix), and decreased appetite. CT of abdomen revealed multiple large gas containing intra-abdominal abscesses and concern for dehiscence.   GI: Multiple intra-abdominal abscesses and peritonitis, Anasarca/volume overload with percutaneous drain placed 8/31. Drained >2L of purulent fluid. May need second drain posterior to leaking anastomosis. Albumin is low at 2.2. Pre-albumin 5.1. Last BM was 01/15/17. Pepcid in TPN.  Endo: DM2 (metformin pta), CBGs 133-160 on TPN. Changed to Resistant SSI q4h on 9/1.  Insulin requirements since new TPN bag at 1800PM: 11 units of resistant SSI Lytes: Na and Cl improving. K 3.7, Mg 1.8, Phos 4.1, CoCa 9.6.  Renal: SCr 0.54, stable. Est CrCl ~ 184 ml/min. BUN 5. I/O + 1.5L (net -1.1L for admission). UOP recorded low at 0.2 cc/kg/hr. IVF - NS at 100 ml/hr.  Pulm: 2L [place] (CPAP at home [facility]) Cards:HLD,  BP improving. ST- 90s to low 100s, improving.  Hepatobil: Alk Phos trending down. AST and ALT are normal. Tbili is normal. TG are 135.   Neuro: GCS is 15. RASS is 0. Pain is 7-8 out of 10. IV Dilaudid prn.  ID: Zosyn for peritonitis - day# 4. WBC within normal limits. Afebrile. Lactic acid normal on admission.   Best Practices: Lovenox 40 BID TPN Access: Double lumen PICC 01/16/17 TPN start date: 01/16/17  Nutritional Goals (per RD recommendation 8/31): KCal:  4166-0630 Protein: 115-130 g   Current Nutrition:  NPO, TPN  Plan:  Increase Clinimix E 5/15 to 93m/hr  Continue 20% lipid emulsion at 2110mhr x12 hrs Decrease NS to 10 ml/hr when TPN hung at 1800 PM.  This provides 99.6 g of protein and 1894 kCals per day meeting ~87% of protein and 82% of kcal needs.  KCl 106mx2 runs today to keep K at goal Add MVI and trace elements in TPN. Add Pepcid 81m91m TPN bag. Continue Resistant SSI q4h.  Watch CBGs - may need to add insulin to TPN bag  Monitor TPN labs, watch TG level  F/U volume status and need to adjust fluid rates   JessSloan LeiterarmD, BCPS Clinical Pharmacist Clinical phone 01/18/2017 until 3:30PM - #[phone]- #[phone] hours, please call #[phone] 01/18/2017,7:49 AM

## 2017-01-19 ENCOUNTER — Encounter (HOSPITAL_COMMUNITY): Payer: Self-pay | Admitting: Radiology

## 2017-01-19 ENCOUNTER — Inpatient Hospital Stay (HOSPITAL_COMMUNITY): Payer: 59

## 2017-01-19 LAB — TRIGLYCERIDES: Triglycerides: 140 mg/dL (ref <150)

## 2017-01-19 LAB — DIFFERENTIAL
BASOS ABS: 0 10*3/uL (ref 0.0–0.1)
BASOS PCT: 0 %
EOS ABS: 0.1 10*3/uL (ref 0.0–0.7)
Eosinophils Relative: 1 %
Lymphocytes Relative: 17 %
Lymphs Abs: 1.1 10*3/uL (ref 0.7–4.0)
MONOS PCT: 12 %
Monocytes Absolute: 0.7 10*3/uL (ref 0.1–1.0)
NEUTROS ABS: 4.3 10*3/uL (ref 1.7–7.7)
NEUTROS PCT: 70 %

## 2017-01-19 LAB — COMPREHENSIVE METABOLIC PANEL
ALK PHOS: 116 U/L (ref 38–126)
ALT: 11 U/L — AB (ref 17–63)
ANION GAP: 6 (ref 5–15)
AST: 19 U/L (ref 15–41)
Albumin: 1.6 g/dL — ABNORMAL LOW (ref 3.5–5.0)
BUN: 6 mg/dL (ref 6–20)
CALCIUM: 7.9 mg/dL — AB (ref 8.9–10.3)
CO2: 30 mmol/L (ref 22–32)
CREATININE: 0.53 mg/dL — AB (ref 0.61–1.24)
Chloride: 99 mmol/L — ABNORMAL LOW (ref 101–111)
Glucose, Bld: 163 mg/dL — ABNORMAL HIGH (ref 65–99)
Potassium: 3.8 mmol/L (ref 3.5–5.1)
SODIUM: 135 mmol/L (ref 135–145)
TOTAL PROTEIN: 5.7 g/dL — AB (ref 6.5–8.1)
Total Bilirubin: 0.3 mg/dL (ref 0.3–1.2)

## 2017-01-19 LAB — CBC
HEMATOCRIT: 32 % — AB (ref 39.0–52.0)
Hemoglobin: 9.5 g/dL — ABNORMAL LOW (ref 13.0–17.0)
MCH: 25.7 pg — ABNORMAL LOW (ref 26.0–34.0)
MCHC: 29.7 g/dL — AB (ref 30.0–36.0)
MCV: 86.5 fL (ref 78.0–100.0)
PLATELETS: 362 10*3/uL (ref 150–400)
RBC: 3.7 MIL/uL — ABNORMAL LOW (ref 4.22–5.81)
RDW: 15.9 % — AB (ref 11.5–15.5)
WBC: 6.2 10*3/uL (ref 4.0–10.5)

## 2017-01-19 LAB — GLUCOSE, CAPILLARY
GLUCOSE-CAPILLARY: 164 mg/dL — AB (ref 65–99)
Glucose-Capillary: 145 mg/dL — ABNORMAL HIGH (ref 65–99)
Glucose-Capillary: 184 mg/dL — ABNORMAL HIGH (ref 65–99)
Glucose-Capillary: 161 mg/dL — ABNORMAL HIGH (ref 65–99)
Glucose-Capillary: 152 mg/dL — ABNORMAL HIGH (ref 65–99)

## 2017-01-19 LAB — MAGNESIUM: MAGNESIUM: 1.7 mg/dL (ref 1.7–2.4)

## 2017-01-19 LAB — PREALBUMIN: Prealbumin: 5.3 mg/dL — ABNORMAL LOW (ref 18–38)

## 2017-01-19 LAB — PHOSPHORUS: PHOSPHORUS: 4.6 mg/dL (ref 2.5–4.6)

## 2017-01-19 MED ORDER — ENOXAPARIN SODIUM 40 MG/0.4ML ~~LOC~~ SOLN: 40.0000 mg | Freq: Two times a day (BID) | SUBCUTANEOUS | Status: DC

## 2017-01-19 MED ORDER — IOPAMIDOL (ISOVUE-300) INJECTION 61%
INTRAVENOUS | Status: AC
  Administered 2017-01-19: 100 mL
  Filled 2017-01-19: qty 100

## 2017-01-19 MED ORDER — FAMOTIDINE IN NACL 20-0.9 MG/50ML-% IV SOLN
20.0000 mg | INTRAVENOUS | Status: DC
  Filled 2017-01-19: qty 50

## 2017-01-19 MED ORDER — FAMOTIDINE IN NACL 20-0.9 MG/50ML-% IV SOLN
20.0000 mg | Freq: Two times a day (BID) | INTRAVENOUS | Status: DC
  Administered 2017-01-20 (×2): 20 mg via INTRAVENOUS
  Filled 2017-01-19 (×2): qty 50

## 2017-01-19 MED ORDER — HYDROCORTISONE 2.5 % RE CREA
TOPICAL_CREAM | Freq: Two times a day (BID) | RECTAL | Status: DC
  Administered 2017-01-19: 13:00:00 via RECTAL
  Administered 2017-01-19: 1 via RECTAL
  Administered 2017-01-20 – 2017-01-31 (×10): via RECTAL
  Filled 2017-01-19: qty 28.35

## 2017-01-19 MED ORDER — HYDROCORTISONE ACETATE 25 MG RE SUPP
25.0000 mg | Freq: Two times a day (BID) | RECTAL | Status: DC
  Administered 2017-01-19: 25 mg via RECTAL
  Filled 2017-01-19: qty 1

## 2017-01-19 MED ORDER — HYDROCORTISONE 1 % EX CREA
TOPICAL_CREAM | Freq: Three times a day (TID) | CUTANEOUS | Status: DC | PRN
  Filled 2017-01-19: qty 28

## 2017-01-19 MED ORDER — TRACE MINERALS CR-CU-MN-SE-ZN 10-1000-500-60 MCG/ML IV SOLN
INTRAVENOUS | Status: AC
  Administered 2017-01-19: 17:00:00 via INTRAVENOUS
  Filled 2017-01-19: qty 1992

## 2017-01-19 MED ORDER — POTASSIUM CHLORIDE 10 MEQ/50ML IV SOLN
10.0000 meq | INTRAVENOUS | Status: AC
  Administered 2017-01-19 (×2): 10 meq via INTRAVENOUS
  Filled 2017-01-19 (×2): qty 50

## 2017-01-19 MED ORDER — METHOCARBAMOL 1000 MG/10ML IJ SOLN
500.0000 mg | Freq: Three times a day (TID) | INTRAVENOUS | Status: DC
  Administered 2017-01-19 – 2017-01-20 (×3): 500 mg via INTRAVENOUS
  Filled 2017-01-19 (×4): qty 5

## 2017-01-19 MED ORDER — FAT EMULSION 20 % IV EMUL
240.0000 mL | INTRAVENOUS | Status: AC
  Administered 2017-01-19: 240 mL via INTRAVENOUS
  Filled 2017-01-19: qty 250

## 2017-01-19 MED ORDER — MAGNESIUM SULFATE 2 GM/50ML IV SOLN
2.0000 g | Freq: Once | INTRAVENOUS | Status: AC
  Administered 2017-01-19: 2 g via INTRAVENOUS
  Filled 2017-01-19: qty 50

## 2017-01-19 NOTE — Progress Notes (Signed)
[place] NOTE   Pharmacy Consult for TPN Indication: Prolonged NPO  Patient Measurements: Height: '5\' 10"'  (177.8 cm) Weight: (!) 348 lb 12.8 oz (158.2 kg) IBW/kg (Calculated) : 73 TPN AdjBW (KG): 93.4 Body mass index is 50.05 kg/m.  Assessment:  41 year old obese male s/p laparoscopic Roux-en-Y/gastric bypass surgery on 12/08/16 and hospitalization 8/14 through 01/10/17 for anasarca, abdominal pain, fever, and postoperative seroma with percutaneous drain placement now re-admitted with continued abdominal pain, occasional nausea, non-bloddy emesis, weight loss (on Lasix), and decreased appetite. CT of abdomen revealed multiple large gas containing intra-abdominal abscesses and concern for dehiscence.   GI: Multiple intra-abdominal abscesses and peritonitis, Anasarca/volume overload with percutaneous drain placed 8/31. Drained >2L of purulent fluid. May need second drain posterior to leaking anastomosis - CT scan today for possible drainage tuesday. Albumin 1.6. Pre-albumin slightly increased from 5.1 to 5.3. Last BM was 01/15/17. Pepcid in TPN.  Endo: DM2 (metformin pta), CBGs 145 - 150 on TPN. Resistant SSI q4h since 9/1.  Insulin requirements since new TPN bag at 1800PM: 13 units of resistant SSI Lytes: Na 135, Cl 99; K/Mg stable at 3.8/1.7, Phos good at 4.6, CoCa - 9.8  Renal: SCr 0.53, stable. Unsure of accuracy of urine collection - noted at 0.1 mL/kg/hr IVF - NS 35 mL/hr Pulm: 2L [place] (CPAP at home [facility]) Cards:HLD,  BP improving. ST- 90s to low 100s, improving.  Hepatobil: Alk Phos trending down. AST and ALT are normal. Tbili is normal. TG are 140.   Neuro: GCS is 15. RASS is 0. Pain is 7-8 out of 10. IV Dilaudid prn.  ID: Zosyn for peritonitis - day# 5. WBC within normal limits. Afebrile. Lactic acid normal on admission.   Best Practices: Lovenox 40 BID TPN Access: Double lumen PICC 01/16/17 TPN start date: 01/16/17  Nutritional Goals (per RD  recommendation 8/31): KCal: 8938-1017 Protein: 115-130 g   Current Nutrition:  NPO, TPN  Plan:  Continue Clinimix E 5/15 to 12m/hr  Continue 20% lipid emulsion at 298mhr x12 hrs This provides 99.6 g of protein and 1894 kCals per day meeting ~87% of protein and 82% of kcal needs.  KCl 1042mx2 runs; Mg 2 g x 1 to keep at goal Add MVI and trace elements in TPN. Add Pepcid 21m22m TPN bag. Continue Resistant SSI q4h.  Add 10 units insulin to TPN  Monitor TPN labs, watch TG level   MichLevester FresharmD, BCPS, BCCCP Clinical Pharmacist Clinical phone for 01/19/2017 from 7a-3:30p: [phone] 3:30p, please call main pharmacy at: [phone] 01/19/2017 8:04 AM

## 2017-01-19 NOTE — Progress Notes (Signed)
Note CT Abd/Pelvis ordered.  Have placed order to hold Lovenox tomorrow for possible procedure in IR if appropriate and as schedule allows.   Loyce DysKacie Matthews, MS RD PA-C 8:41 AM

## 2017-01-19 NOTE — Progress Notes (Signed)
[facility] Progress Note     Subjective: CC: diarrhea and hemorrhoids Patient complaining that he is having a lot of diarrhea and hemorrhoids causing pain. Abdominal pain is unchanged in lower abdomen, starting to have more pain in upper abdomen. Denies nausea and vomiting.  UOP good. VSS.   Objective: Vital signs in last 24 hours: Temp:  [98 F (36.7 C)-98.8 F (37.1 C)] 98 F (36.7 C) (09/03 0442) Pulse Rate:  [96-112] 105 (09/03 0442) Resp:  [18-25] 20 (09/03 0442) BP: (122-154)/(76-89) 143/80 (09/03 0442) SpO2:  [95 %-100 %] 96 % (09/03 0442) Weight:  [158.2 kg (348 lb 12.8 oz)] 158.2 kg (348 lb 12.8 oz) (09/03 0442) Last BM Date: 01/15/17  Intake/Output from previous day: 09/02 0701 - 09/03 0700 In: 310 [P.O.:240; I.V.:10; IV Piggyback:50] Out: 625 [Urine:400; Drains:225] Intake/Output this shift: Total I/O In: -  Out: 100 [Drains:100]  PE: Gen:  Alert, NAD, pleasant Card:  Regular rate and rhythm, pedal pulses 2+ BL Pulm:  Normal effort, clear to auscultation bilaterally Abd: Soft, mildly tender, non-distended, bowel sounds present; Drain in RLQ with purulent drainage in tubing.  Skin: warm and dry, no rashes  Psych: A&Ox3   Lab Results:   Recent Labs  01/17/17 0407 01/19/17 0516  WBC 5.8 6.2  HGB 8.9* 9.5*  HCT 29.0* 32.0*  PLT 478* 362   BMET  Recent Labs  01/18/17 0520 01/19/17 0516  NA 136 135  K 3.7 3.8  CL 99* 99*  CO2 31 30  GLUCOSE 154* 163*  BUN 5* 6  CREATININE 0.54* 0.53*  CALCIUM 7.8* 7.9*   PT/INR No results for input(s): LABPROT, INR in the last 72 hours. CMP     Component Value Date/Time   NA 135 01/19/2017 0516   K 3.8 01/19/2017 0516   CL 99 (L) 01/19/2017 0516   CO2 30 01/19/2017 0516   GLUCOSE 163 (H) 01/19/2017 0516   BUN 6 01/19/2017 0516   CREATININE 0.53 (L) 01/19/2017 0516   CALCIUM 7.9 (L) 01/19/2017 0516   PROT 5.7 (L) 01/19/2017 0516   ALBUMIN 1.6 (L) 01/19/2017 0516   AST 19 01/19/2017 0516    ALT 11 (L) 01/19/2017 0516   ALKPHOS 116 01/19/2017 0516   BILITOT 0.3 01/19/2017 0516   GFRNONAA >60 01/19/2017 0516   GFRAA >60 01/19/2017 0516    Anti-infectives: Anti-infectives    Start     Dose/Rate Route Frequency Ordered Stop   01/15/17 1815  piperacillin-tazobactam (ZOSYN) IVPB 3.375 g  Status:  Discontinued     3.375 g 12.5 mL/hr over 240 Minutes Intravenous Every 8 hours 01/15/17 1804 01/15/17 1804   01/15/17 1630  piperacillin-tazobactam (ZOSYN) IVPB 3.375 g     3.375 g 12.5 mL/hr over 240 Minutes Intravenous Every 8 hours 01/15/17 1616         Assessment/Plan Postprocedural intraabdominal abscess S/P perc drain 8/31 - continue TPN - drain with 225 cc output in 24 h - continue drain - WBC 6.2, afebrile - mobilize - fluid collection next to GJ anastomosis - repeat CT this evening for possible IR intervention Tuesday  DM HLD Morbid Obesity  FEN - NPO, TPN VTE - SCDs, lovenox ID - IV Zosyn (8/30>>)  Plan: Will order preparation H for hemorrhoids. Continue TPN, IV abx. CT tonight, possible IR drain tomorrow  LOS: 4 days    Wells GuilesKelly Rayburn , [facility] 01/19/2017, 9:19 AM Pager: [phone] Consults: [phone] Mon-Fri 7:00 am-4:30 pm Sat-Sun 7:00 am-11:30 am

## 2017-01-19 NOTE — Progress Notes (Signed)
PROGRESS NOTE    Albert White  ZOX:[id number] DCharlyne PetrinOB: 04-Oct-1975 DOA: 01/15/2017 PCP: Alysia PennaHolwerda, Scott, MD    Brief Narrative:  41 year old male who presented with abdominal pain. Is known to have a recent Roux-en-Y/gastric bypass on 12[place]08/[place] in [place] by Dr. Franki Cabothomas Walsh, rehospitalized 12/30/16-01/10/17 for anasarca, abdominal pain, fever of 103F and postoperative seroma for which a percutaneous drain was placed. Also has a diagnosis of type 2 diabetes mellitus, dyslipidemia and obstructive sleep apnea. After his discharge patient complained of persistent abdominal pain, associated with nausea, emesis and decreased appetite, outpatient CT of the abdomen showed gas containing 10.7 x 5 cm abscess abutting the posterior margin of the GJ anastomosis worrisome for dehiscence, diffuse peritonitis with large gas containing abscess throughout the peritoneal cavity including a right perihepatic abscess compressing the right liver lobe and provoking an ileus, findings that prompted rehospitalization.   Patient was admitted to the hospital with the working diagnosis of abdominal sepsis due to peritonitis, and intra-abdominal abscess related to a postoperative complication of Roux-en-Y/gastric bypass (12/08/16).   Assessment & Plan:   Active Problems:   Postprocedural intraabdominal abscess   1. Sepsis, present on admission due to peritonitis and intra-abdominal abscess. Will continue antibiotic therapy with IV Zosyn #4. Percutaneous drain in place with purulent drainage 225 cc. No fever, wbc at 6,2, cultures has no growth. Plan for follow ct abdomen, may need further IR intervention.   2. Type 2 diabetes mellitus. Will continue glucose cover and monitoring with sliding scale insulin, patient on TPN, capillary glucose 150, 161, 145, 184, 161.   3. Obstructive sleep apnea. Continue cpap, will continue oxymetry monitoring and as needed supplemental 02 per [place].   4. Chronic multifactorial anemia. Hb and  hct stable, at 9,5 and hct at 32, will continue to follow on cell count. No signs of active bleeding.   5. Morbid obesity. Patient sp bariatric surgery, high risk for inpatient complications   DVT prophylaxis:enoxaparin  Code Status: full  Family Communication: I spoke with patient's wife at the bedside and all questions were addressed. Disposition Plan: Home   Consultants:   Surgery  Interventional radiology  Procedures:   Percutaneous drain placement  Antimicrobials:   Zosyn   Subjective: Patient not feeling well, positive generalized malaise, positive rectal pain, moderate to intensity, worse with pressure, no improving factors, no associated nausea or vomiting, no abdominal pain. Drain in place, improved lower extremity edema.   Objective: Vitals:   01/18/17 1826 01/18/17 2007 01/18/17 2208 01/19/17 0442  BP: 130/76 122/79  (!) 143/80  Pulse: (!) 103 96 (!) 108 (!) 105  Resp: (!) 21 20 18 20   Temp: 98.2 F (36.8 C) 98.4 F (36.9 C)  98 F (36.7 C)  TempSrc: Oral Oral  Oral  SpO2: 95% 97% 96% 96%  Weight:    (!) 158.2 kg (348 lb 12.8 oz)  Height:        Intake/Output Summary (Last 24 hours) at 01/19/17 1034 Last data filed at 01/19/17 0836  Gross per 24 hour  Intake              260 ml  Output              650 ml  Net             -390 ml   Filed Weights   01/17/17 0408 01/18/17 0412 01/19/17 0442  Weight: (!) 154.2 kg (339 lb 14.4 oz) (!) 154.8 kg (341 lb 4.8 oz) (!) 158.2  kg (348 lb 12.8 oz)    Examination:  General: deconditioned Neurology: Awake and alert, non focal  E ENT: mild pallor, no icterus, oral mucosa moist Cardiovascular: S1-S2 present, rhythmic, no gallops, rubs, or murmurs. No jugular venous distention, +++ pitting lower extremity edema. Pulmonary: vesicular breath sounds bilaterally, adequate air movement, no wheezing, rhonchi or rales. Gastrointestinal. Abdomen protuberant, no organomegaly, non tender, no rebound or guarding.  Percutaneous drain in place, purulent drainage.  Skin. No rashes Musculoskeletal: no joint deformities     Data Reviewed: I have personally reviewed following labs and imaging studies  CBC:  Recent Labs Lab 01/15/17 1420 01/16/17 0411 01/17/17 0407 01/19/17 0516  WBC 8.6 6.7 5.8 6.2  NEUTROABS 6.3  --  4.1 4.3  HGB 10.3* 9.5* 8.9* 9.5*  HCT 33.1* 30.4* 29.0* 32.0*  MCV 86.2 86.6 86.3 86.5  PLT 578* 514* 478* 362   Basic Metabolic Panel:  Recent Labs Lab 01/15/17 1420 01/16/17 0411 01/16/17 0950 01/17/17 0407 01/18/17 0520 01/19/17 0516  NA 133* 134*  --  135 136 135  K 3.9 3.9  --  3.6 3.7 3.8  CL 91* 96*  --  97* 99* 99*  CO2 26 26  --  30 31 30   GLUCOSE 121* 121*  --  160* 154* 163*  BUN 10 9  --  5* 5* 6  CREATININE 0.75 0.87  --  0.66 0.54* 0.53*  CALCIUM 8.8* 8.4*  --  8.0* 7.8* 7.9*  MG  --  1.8  --  1.9 1.8 1.7  PHOS  --   --  4.9* 3.8 4.1 4.6   GFR: Estimated Creatinine Clearance: 185.9 mL/min (A) (by C-G formula based on SCr of 0.53 mg/dL (L)). Liver Function Tests:  Recent Labs Lab 01/15/17 1420 01/17/17 0407 01/19/17 0516  AST 27 19 19   ALT 17 13* 11*  ALKPHOS 240* 168* 116  BILITOT 1.1 0.7 0.3  PROT 7.0 5.8* 5.7*  ALBUMIN 2.2* 1.7* 1.6*   No results for input(s): LIPASE, AMYLASE in the last 168 hours. No results for input(s): AMMONIA in the last 168 hours. Coagulation Profile:  Recent Labs Lab 01/15/17 1420  INR 1.07   Cardiac Enzymes: No results for input(s): CKTOTAL, CKMB, CKMBINDEX, TROPONINI in the last 168 hours. BNP (last 3 results) No results for input(s): PROBNP in the last 8760 hours. HbA1C: No results for input(s): HGBA1C in the last 72 hours. CBG:  Recent Labs Lab 01/18/17 1200 01/18/17 2004 01/19/17 0001 01/19/17 0439 01/19/17 0811  GLUCAP 131* 150* 161* 145* 184*   Lipid Profile:  Recent Labs  01/17/17 0407 01/19/17 0516  TRIG 135 140   Thyroid Function Tests: No results for input(s): TSH,  T4TOTAL, FREET4, T3FREE, THYROIDAB in the last 72 hours. Anemia Panel:  Recent Labs  01/17/17 0407  VITAMINB12 1,441*  FOLATE 7.7  FERRITIN 342*  TIBC 122*  IRON 14*  RETICCTPCT 2.9      Radiology Studies: I have reviewed all of the imaging during this hospital visit personally     Scheduled Meds: . chlorhexidine  15 mL Mouth Rinse BID  . Chlorhexidine Gluconate Cloth  6 each Topical Daily  . [START ON 01/20/2017] enoxaparin (LOVENOX) injection  40 mg Subcutaneous Q12H  . hydrocortisone  25 mg Rectal BID  . insulin aspart  0-20 Units Subcutaneous Q4H  . mouth rinse  15 mL Mouth Rinse q12n4p  . pneumococcal 23 valent vaccine  0.5 mL Intramuscular Tomorrow-1000  . sodium chloride flush  10-40  mL Intracatheter Q12H  . sodium chloride flush  5 mL Intravenous Q8H   Continuous Infusions: . sodium chloride 35 mL/hr at 01/18/17 2351  . acetaminophen Stopped (01/19/17 0640)  . Marland KitchenTPN (CLINIMIX-E) Adult     And  . fat emulsion    . magnesium sulfate 1 - 4 g bolus IVPB 2 g (01/19/17 1031)  . methocarbamol (ROBAXIN)  IV    . piperacillin-tazobactam (ZOSYN)  IV 3.375 g (01/19/17 0625)  . Marland KitchenTPN (CLINIMIX-E) Adult 83 mL/hr at 01/18/17 1815     LOS: 4 days        Keeli Roberg Annett Gula, MD [facility] Pager [phone]

## 2017-01-20 ENCOUNTER — Encounter (HOSPITAL_COMMUNITY): Payer: Self-pay | Admitting: General Surgery

## 2017-01-20 LAB — GLUCOSE, CAPILLARY
GLUCOSE-CAPILLARY: 136 mg/dL — AB (ref 65–99)
Glucose-Capillary: 154 mg/dL — ABNORMAL HIGH (ref 65–99)
Glucose-Capillary: 160 mg/dL — ABNORMAL HIGH (ref 65–99)
Glucose-Capillary: 166 mg/dL — ABNORMAL HIGH (ref 65–99)
Glucose-Capillary: 179 mg/dL — ABNORMAL HIGH (ref 65–99)
Glucose-Capillary: 186 mg/dL — ABNORMAL HIGH (ref 65–99)

## 2017-01-20 LAB — AEROBIC/ANAEROBIC CULTURE W GRAM STAIN (SURGICAL/DEEP WOUND)

## 2017-01-20 LAB — BASIC METABOLIC PANEL
Anion gap: 11 (ref 5–15)
BUN: 7 mg/dL (ref 6–20)
CHLORIDE: 93 mmol/L — AB (ref 101–111)
CO2: 21 mmol/L — AB (ref 22–32)
Calcium: 7.8 mg/dL — ABNORMAL LOW (ref 8.9–10.3)
Creatinine, Ser: 0.59 mg/dL — ABNORMAL LOW (ref 0.61–1.24)
GFR calc non Af Amer: >60 mL/min (ref >60)
Glucose, Bld: 795 mg/dL (ref 65–99)
Potassium: 6.1 mmol/L — ABNORMAL HIGH (ref 3.5–5.1)
SODIUM: 125 mmol/L — AB (ref 135–145)

## 2017-01-20 LAB — PROTIME-INR
INR: 1.19
Prothrombin Time: 15 seconds (ref 11.4–15.2)

## 2017-01-20 LAB — CULTURE, BLOOD (ROUTINE X 2)
CULTURE: NO GROWTH
CULTURE: NO GROWTH
SPECIAL REQUESTS: ADEQUATE
Special Requests: ADEQUATE

## 2017-01-20 LAB — MAGNESIUM: Magnesium: 2 mg/dL (ref 1.7–2.4)

## 2017-01-20 LAB — PHOSPHORUS: PHOSPHORUS: 6.9 mg/dL — AB (ref 2.5–4.6)

## 2017-01-20 MED ORDER — METHOCARBAMOL 1000 MG/10ML IJ SOLN
1000.0000 mg | Freq: Three times a day (TID) | INTRAVENOUS | Status: DC
  Administered 2017-01-20 – 2017-01-30 (×29): 1000 mg via INTRAVENOUS
  Filled 2017-01-20 (×32): qty 10

## 2017-01-20 MED ORDER — FAT EMULSION 20 % IV EMUL
240.0000 mL | INTRAVENOUS | Status: AC
  Administered 2017-01-20: 240 mL via INTRAVENOUS
  Filled 2017-01-20: qty 250

## 2017-01-20 MED ORDER — ENOXAPARIN SODIUM 40 MG/0.4ML ~~LOC~~ SOLN
40.0000 mg | Freq: Two times a day (BID) | SUBCUTANEOUS | Status: DC
  Administered 2017-01-21 – 2017-01-26 (×10): 40 mg via SUBCUTANEOUS
  Filled 2017-01-20 (×11): qty 0.4

## 2017-01-20 MED ORDER — TRACE MINERALS CR-CU-MN-SE-ZN 10-1000-500-60 MCG/ML IV SOLN
INTRAVENOUS | Status: AC
  Administered 2017-01-20: 17:00:00 via INTRAVENOUS
  Filled 2017-01-20: qty 2400

## 2017-01-20 NOTE — Progress Notes (Signed)
[facility] Progress Note     Subjective: CC: abdominal pain Patient with minimal improvement in abdominal pain. Denies nausea. Pain from hemorrhoids slightly improved. Feels like diarrhea has slowed some, loose stools but not watery.   Objective: Vital signs in last 24 hours: Temp:  [98.7 F (37.1 C)-99 F (37.2 C)] 98.7 F (37.1 C) (09/04 0454) Pulse Rate:  [83-108] 83 (09/04 0454) Resp:  [20] 20 (09/04 0454) BP: (129-141)/(83) 141/83 (09/04 0454) SpO2:  [92 %-94 %] 94 % (09/04 0454) Weight:  [149.7 kg (330 lb 1.6 oz)] 149.7 kg (330 lb 1.6 oz) (09/04 0454) Last BM Date: 01/19/17  Intake/Output from previous day: 09/03 0701 - 09/04 0700 In: 2469 [I.V.:1954; IV Piggyback:515] Out: 1000 [Urine:675; Drains:325] Intake/Output this shift: No intake/output data recorded.  PE: Gen:  Alert, NAD, pleasant Card:  Regular rate and rhythm, pedal pulses 2+ BL Pulm:  Normal effort, clear to auscultation bilaterally Abd: Soft, appropriately tender, non-distended, bowel sounds present , no HSM, incisions C/D/I; RLQ drain with serosanguinous drainage Skin: warm and dry, no rashes  Psych: A&Ox3   Lab Results:   Recent Labs  01/19/17 0516  WBC 6.2  HGB 9.5*  HCT 32.0*  PLT 362   BMET  Recent Labs  01/18/17 0520 01/19/17 0516  NA 136 135  K 3.7 3.8  CL 99* 99*  CO2 31 30  GLUCOSE 154* 163*  BUN 5* 6  CREATININE 0.54* 0.53*  CALCIUM 7.8* 7.9*   PT/INR No results for input(s): LABPROT, INR in the last 72 hours. CMP     Component Value Date/Time   NA 135 01/19/2017 0516   K 3.8 01/19/2017 0516   CL 99 (L) 01/19/2017 0516   CO2 30 01/19/2017 0516   GLUCOSE 163 (H) 01/19/2017 0516   BUN 6 01/19/2017 0516   CREATININE 0.53 (L) 01/19/2017 0516   CALCIUM 7.9 (L) 01/19/2017 0516   PROT 5.7 (L) 01/19/2017 0516   ALBUMIN 1.6 (L) 01/19/2017 0516   AST 19 01/19/2017 0516   ALT 11 (L) 01/19/2017 0516   ALKPHOS 116 01/19/2017 0516   BILITOT 0.3 01/19/2017  0516   GFRNONAA >60 01/19/2017 0516   GFRAA >60 01/19/2017 0516   Lipase  No results found for: LIPASE     Studies/Results: Ct Abdomen Pelvis W Contrast  Result Date: 01/19/2017 CLINICAL DATA:  Follow-up intestinal bypass and anastomosis status EXAM: CT ABDOMEN AND PELVIS WITH CONTRAST TECHNIQUE: Multidetector CT imaging of the abdomen and pelvis was performed using the standard protocol following bolus administration of intravenous contrast. CONTRAST:  100 mL Isovue-300 intravenous COMPARISON:  01/16/2017, 01/15/2017 FINDINGS: Lower chest: Moderate left-sided pleural effusion and small right pleural effusion, slightly increased compared to prior. Bilateral lower lobe consolidations may reflect atelectasis or pneumonia. Heart size is within normal limits. Hepatobiliary: Small hypodense collection along the left margin of the liver. Decreased rim enhancing sub capsular fluid around the liver. Residual lenticular shaped collection measuring 16 cm in AP by 3.3 cm transverse. No calcified gallstones. No biliary dilatation. Pancreas: Unremarkable. No pancreatic ductal dilatation or surrounding inflammatory changes. Spleen: Normal in size without focal abnormality. Decreased rim enhancing left perisplenic fluid collection, measuring 11.7 cm by 2.2 cm compared with 12.1 x 2.4 cm previously. Adrenals/Urinary Tract: Adrenal glands are within normal limits. 3 mm stone in the upper pole of the left kidney. No hydronephrosis. The bladder is unremarkable small cyst lower pole left kidney. Stomach/Bowel: Patient is status post gastric bypass. Slight increased size of a  gas and fluid collection, adjacent to the suture line, this measures 10.9 x 5.8 cm compared with 10.7 x 5 cm previously. It is now contiguous with a small gas and fluid collection between the gastric pouch and anterior aspect of the liver. Additional hypodense fluid collection measuring about 4.7 by 2.4 cm adjacent to the pyloric region of the excluded  stomach. Previously this measured 2.5 x 4.3 cm. There is no evidence for a bowel obstruction. There is mild wall thickening of the hepatic flexure and right colon. Vascular/Lymphatic: Aortic atherosclerosis. No enlarged abdominal or pelvic lymph nodes. Reproductive: Prostate is unremarkable. Other: Increased fluid in the pelvis/presacral space. Interval placement of a right lower percutaneous drainage catheter with markedly decreased in size of the previously noted large peritoneal fluid collection. No discrete residual measurable collection at this time. Small scattered rim enhancing fluid collections are visualized in the right gutter, inferior margin of the liver, and in the left upper quadrant of the abdomen. Musculoskeletal: Degenerative changes. No acute or suspicious findings. IMPRESSION: 1. Interim placement of right lower quadrant percutaneous drainage catheter with significant decrease in size of previously noted large intraperitoneal fluid collection. Interval decrease in size of large right perihepatic and perisplenic rim enhancing fluid collections with residuals as described above. 2. Slight interval increase in size of a gas and fluid containing collection adjacent to the gastric sutures/gastro jejunal anastomosis. Gas and fluid collection now extend slightly more anterior, between the gastric pouch and the liver. Given that the other collections have decreased in size, findings would be concerning for dehiscence/anastomotic leak. 3. Additional scattered small rim enhancing fluid collections within the abdomen and pelvis. 4. Mild wall thickening of the ascending colon and hepatic flexure suggesting a colitis or reactive inflammation. Residual hazy edema and inflammation of the central and right upper quadrant mesentery. 5. Slight increased pleural effusions with continued bilateral lower lobe atelectasis or pneumonia 6. Nonobstructing stone in the left kidney Electronically Signed   By: Jasmine Pang  M.D.   On: 01/19/2017 21:19    Anti-infectives: Anti-infectives    Start     Dose/Rate Route Frequency Ordered Stop   01/15/17 1815  piperacillin-tazobactam (ZOSYN) IVPB 3.375 g  Status:  Discontinued     3.375 g 12.5 mL/hr over 240 Minutes Intravenous Every 8 hours 01/15/17 1804 01/15/17 1804   01/15/17 1630  piperacillin-tazobactam (ZOSYN) IVPB 3.375 g     3.375 g 12.5 mL/hr over 240 Minutes Intravenous Every 8 hours 01/15/17 1616         Assessment/Plan Postprocedural intraabdominal abscess S/P perc drain 8/31 - continue TPN - drain with 225 cc output in 24 h - continue drain - WBC 6.2, afebrile - mobilize - fluid collection next to GJ anastomosis - repeat CT showed decrease in large right perihepatic and perisplenic abscesses, slight interval increase in GJ collection with fluid and gas, concerning for anastomotic leak, colitis - possible IR drain today?   DM HLD Morbid Obesity  FEN - NPO, TPN VTE - SCDs, lovenox ID - IV Zosyn (8/30>>)  Plan: CT concerning for anastomotic leak. Will discuss with MD.   LOS: 5 days    Wells Guiles , [facility] 01/20/2017, 10:33 AM Pager: [phone] Consults: [phone] Mon-Fri 7:00 am-4:30 pm Sat-Sun 7:00 am-11:30 am

## 2017-01-20 NOTE — Progress Notes (Addendum)
[place] NOTE   Pharmacy Consult for TPN Indication: Prolonged NPO  Patient Measurements: Height: '5\' 10"'  (177.8 cm) Weight: (!) 330 lb 1.6 oz (149.7 kg) IBW/kg (Calculated) : 73 TPN AdjBW (KG): 93.4 Body mass index is 47.36 kg/m.  Assessment:  41 year old obese male s/p laparoscopic Roux-en-Y/gastric bypass surgery on 12/08/16 and hospitalization 8/14 through 01/10/17 for anasarca, abdominal pain, fever, and postoperative seroma with percutaneous drain placement now re-admitted with continued abdominal pain, occasional nausea, non-bloddy emesis, weight loss (on Lasix), and decreased appetite. CT of abdomen revealed multiple large gas containing intra-abdominal abscesses and concern for dehiscence.   GI: Multiple intra-abdominal abscesses and peritonitis, Anasarca/vol overload with perc drain placed 8/31 (>2L of purulent fluid). Plan 2nd drain posterior to leaking anastomosis. Albumin 1.6. Pre-albumin stable 5.3. Last BM 9/4. Pepcid in TPN. Zofran. Hemorrhoids on Anusol-hc Endo: DM2 (metformin pta), CBGs 136-164 (795 appears to be contaminant) Insulin requirements since new TPN bag at 1800: 22 units of resistant SSI + 15 units insulin added to TPN bag Lytes: Na 133/Cl 97, K and Mag WNL, Phos up a bit 5, CoCa~9.8 (Ca x Phos = 49). *Labs from 9/4 appear to be contaminated Renal: SCr 0.53 stable. UOP 0.4 mL/kg/hr per documentation. IVF - NS 35 mL/hr Pulm: [place] (CPAP at home [facility]) Cards: HLD, BP improved. ST- 90s Hepatobil: Alk Phos trending down. AST and ALT are normal. Tbili is normal. TG stable 140   Neuro: Pain score 7 - IV Dilaudid PRN, robaxin prn ID: Zosyn for peritonitis - day #7 - rare gardnerella/strep constellatus in abdominal abscess cx. WBC WNL. Afebrile. LA normal  Best Practices: Lovenox 40 BID TPN Access: Double lumen PICC 01/16/17 TPN start date: 01/16/17  Nutritional Goals (per RD recommendation 8/31): KCal: 2119-4174 Protein: 115-130  g   Current Nutrition:  NPO, TPN  Plan:  Continue Clinimix E 5/15 at 167m/hr  Continue 20% lipid emulsion at 252mhr x 12 hrs This provides 120g of protein and 2184 kCals per day meeting ~100% of protein and 95% of kcal needs.  Add MVI and trace elements in TPN Add Pepcid 4068mo TPN bag Increase insulin in TPN bag to 20 units Continue Resistant SSI q4h and monitor CBGs closely  Monitor TPN labs in AM - watch lytes closely, may need to remove   HalElicia LampharmD, BCPS Clinical Pharmacist Rx Phone # for today: #[phone] 3:30PM, please call Main Rx: #[phone]/2018 9:58 AM

## 2017-01-20 NOTE — Progress Notes (Signed)
PROGRESS NOTE    Albert White  EXB:[id number] DOB: 07/23/1975 DOA: 01/15/2017 PCP: Alysia Penna, MD    Brief Narrative:  41 year old male who presented with abdominal pain. Is known to have a recent Roux-en-Y/gastric bypass on 12/08/16 in [place] by Dr. Franki Cabot, rehospitalized 12/30/16-01/10/17 for anasarca, abdominal pain, fever of 103F and postoperative seroma for which a percutaneous drain was placed. Also has a diagnosis of type 2 diabetes mellitus, dyslipidemia and obstructive sleep apnea. After his discharge patient complained of persistent abdominal pain, associated with nausea, emesis and decreased appetite, outpatient CT of the abdomen showedgas containing 10.7 x 5 cm abscess abutting the posterior margin of the GJ anastomosis worrisome for dehiscence, diffuse peritonitis with large gas containing abscess throughout the peritoneal cavity including a right perihepatic abscess compressing the right liver lobe and provoking an ileus, findings that prompted rehospitalization.   Patient was admitted to the hospital with the working diagnosis of abdominal sepsis due to peritonitis, and intra-abdominal abscess related to a postoperative complication of Roux-en-Y/gastric bypass (12/08/16).   Assessment & Plan:   Active Problems:   Postprocedural intraabdominal abscess  1. Sepsis, present on admission due to peritonitis and intra-abdominal abscess. Antibiotic therapy with IV Zosyn #5. Percutaneous drain in place with purulent drainage 325 cc. Cultures continue no growth. Follow up ct with decreased in size of intra-abdominal collections, but positive findings cw dehiscence/anastomotic leak, at the gastro jejunal anastomosis.   2. Type 2 diabetes mellitus. Glucose cover and monitoring with sliding scale insulin, continue parenteral nutrition, capillary glucose 179, 186, 166, 154, 136.   3. Obstructive sleep apnea. Cpap,  oxymetry monitoring and as needed supplemental 02 per [place],  to keep 02 saturation above 92%, continue incentive spirometer.   4. Chronic multifactorial anemia. No signs of active bleeding, will check cell counts in am.  5. Morbid obesity. SP bariatric surgery, high risk for inpatient complications   DVT prophylaxis:enoxaparin  Code Status: full  Family Communication: I spoke with patient's wife at the bedside and all questions were addressed. Disposition Plan: Home   Consultants:   Surgery  Interventional radiology  Procedures:   Percutaneous drain placement  Antimicrobials:   Zosyn   Subjective: Patient with intermittent abdominal pain, worse with movement, improved with analgesics, no associated nausea or vomiting, dull in nature with no radiation.  Objective: Vitals:   01/18/17 2208 01/19/17 0442 01/19/17 1343 01/20/17 0454  BP:  (!) 143/80 129/83 (!) 141/83  Pulse: (!) 108 (!) 105 (!) 108 83  Resp: 18 20  20   Temp:  98 F (36.7 C) 99 F (37.2 C) 98.7 F (37.1 C)  TempSrc:  Oral Oral Oral  SpO2: 96% 96% 92% 94%  Weight:  (!) 158.2 kg (348 lb 12.8 oz)  (!) 149.7 kg (330 lb 1.6 oz)  Height:        Intake/Output Summary (Last 24 hours) at 01/20/17 1108 Last data filed at 01/20/17 1102  Gross per 24 hour  Intake             2469 ml  Output              950 ml  Net             1519 ml   Filed Weights   01/18/17 0412 01/19/17 0442 01/20/17 0454  Weight: (!) 154.8 kg (341 lb 4.8 oz) (!) 158.2 kg (348 lb 12.8 oz) (!) 149.7 kg (330 lb 1.6 oz)    Examination:  General: Not in pain  or dyspnea. Deconditioned.  Neurology: Awake and alert, non focal  E ENT: mild pallor, no icterus, oral mucosa moist Cardiovascular: S1-S2 present, rhythmic, no gallops, rubs, or murmurs. No jugular venous distention, no lower extremity edema. Pulmonary: vesicular breath sounds bilaterally, adequate air movement, no wheezing, rhonchi or rales. Gastrointestinal. Abdomen protuberant, no organomegaly, non tender, no rebound or  guarding. Drained in place with purulent discharge. Skin. No rashes Musculoskeletal: no joint deformities     Data Reviewed: I have personally reviewed following labs and imaging studies  CBC:  Recent Labs Lab 01/15/17 1420 01/16/17 0411 01/17/17 0407 01/19/17 0516  WBC 8.6 6.7 5.8 6.2  NEUTROABS 6.3  --  4.1 4.3  HGB 10.3* 9.5* 8.9* 9.5*  HCT 33.1* 30.4* 29.0* 32.0*  MCV 86.2 86.6 86.3 86.5  PLT 578* 514* 478* 362   Basic Metabolic Panel:  Recent Labs Lab 01/15/17 1420 01/16/17 0411 01/16/17 0950 01/17/17 0407 01/18/17 0520 01/19/17 0516  NA 133* 134*  --  135 136 135  K 3.9 3.9  --  3.6 3.7 3.8  CL 91* 96*  --  97* 99* 99*  CO2 26 26  --  30 31 30   GLUCOSE 121* 121*  --  160* 154* 163*  BUN 10 9  --  5* 5* 6  CREATININE 0.75 0.87  --  0.66 0.54* 0.53*  CALCIUM 8.8* 8.4*  --  8.0* 7.8* 7.9*  MG  --  1.8  --  1.9 1.8 1.7  PHOS  --   --  4.9* 3.8 4.1 4.6   GFR: Estimated Creatinine Clearance: 180 mL/min (A) (by C-G formula based on SCr of 0.53 mg/dL (L)). Liver Function Tests:  Recent Labs Lab 01/15/17 1420 01/17/17 0407 01/19/17 0516  AST 27 19 19   ALT 17 13* 11*  ALKPHOS 240* 168* 116  BILITOT 1.1 0.7 0.3  PROT 7.0 5.8* 5.7*  ALBUMIN 2.2* 1.7* 1.6*   No results for input(s): LIPASE, AMYLASE in the last 168 hours. No results for input(s): AMMONIA in the last 168 hours. Coagulation Profile:  Recent Labs Lab 01/15/17 1420  INR 1.07   Cardiac Enzymes: No results for input(s): CKTOTAL, CKMB, CKMBINDEX, TROPONINI in the last 168 hours. BNP (last 3 results) No results for input(s): PROBNP in the last 8760 hours. HbA1C: No results for input(s): HGBA1C in the last 72 hours. CBG:  Recent Labs Lab 01/19/17 1654 01/19/17 2011 01/20/17 0046 01/20/17 0452 01/20/17 0915  GLUCAP 152* 164* 179* 186* 166*   Lipid Profile:  Recent Labs  01/19/17 0516  TRIG 140   Thyroid Function Tests: No results for input(s): TSH, T4TOTAL, FREET4, T3FREE,  THYROIDAB in the last 72 hours. Anemia Panel: No results for input(s): VITAMINB12, FOLATE, FERRITIN, TIBC, IRON, RETICCTPCT in the last 72 hours.    Radiology Studies: I have reviewed all of the imaging during this hospital visit personally     Scheduled Meds: . chlorhexidine  15 mL Mouth Rinse BID  . Chlorhexidine Gluconate Cloth  6 each Topical Daily  . enoxaparin (LOVENOX) injection  40 mg Subcutaneous Q12H  . hydrocortisone   Rectal BID  . insulin aspart  0-20 Units Subcutaneous Q4H  . mouth rinse  15 mL Mouth Rinse q12n4p  . pneumococcal 23 valent vaccine  0.5 mL Intramuscular Tomorrow-1000  . sodium chloride flush  5 mL Intravenous Q8H   Continuous Infusions: . sodium chloride 35 mL/hr at 01/20/17 0234  . methocarbamol (ROBAXIN)  IV    . piperacillin-tazobactam (ZOSYN)  IV Stopped (01/20/17 0915)  . Marland Kitchen.TPN (CLINIMIX-E) Adult 83 mL/hr at 01/19/17 1721     LOS: 5 days        Pricilla Moehle Annett Gulaaniel Jermiah Howton, MD [facility] Pager [phone]

## 2017-01-20 NOTE — Progress Notes (Signed)
Patient ID: Albert White, male   DOB: 19-Sep-1975, 41 y.o.   MRN: [id number]    Referring Physician(s): Dr. Almond LintFaera Byerly  Supervising Physician: Jolaine ClickHoss, Arthur  Patient Status: [facility] - In-pt  Chief Complaint: Intra-abdominal abscess  Subjective: Patient still with some abdominal pain.  Is slightly better since other drain placed last week.   Allergies: Patient has no known allergies.  Medications: Prior to Admission medications   Medication Sig Start Date End Date Taking? Authorizing Provider  acetaminophen (TYLENOL) 325 MG tablet Take 2 tablets (650 mg total) by mouth every 8 (eight) hours. Patient taking differently: Take 650 mg by mouth every 8 (eight) hours as needed for moderate pain or headache.  02/27/16  Yes Mesner, Barbara CowerJason, MD  atorvastatin (LIPITOR) 10 MG tablet Take 10 mg by mouth every evening.  09/18/15  Yes [provider]  calcium carbonate (TUMS - DOSED IN MG ELEMENTAL CALCIUM) 500 MG chewable tablet Chew 500 mg by mouth as needed for heartburn.   Yes [provider]  nystatin (MYCOSTATIN) 100000 UNIT/ML suspension Take by mouth as needed. Thrush. 1 tsp 11/27/16  Yes [provider]  omeprazole (PRILOSEC) 20 MG capsule Take 20 mg by mouth 2 (two) times daily. 11/27/16  Yes [provider]  OVER THE COUNTER MEDICATION Apply 1 application topically as needed (eczema). [employer] Eczema Cream    Yes [provider]  oxyCODONE (ROXICODONE) 5 MG immediate release tablet Take 1 tablet (5 mg total) by mouth every 6 (six) hours as needed for severe pain. 03/05/16  Yes Ghimire, Werner LeanShanker M, MD  traZODone (DESYREL) 100 MG tablet Take 100 mg by mouth at bedtime.   Yes [provider]  ursodiol (ACTIGALL) 300 MG capsule Take 300 mg by mouth 2 (two) times daily. 11/27/16  Yes [provider]  cephALEXin (KEFLEX) 500 MG capsule Take 1 capsule (500 mg total) by mouth 3 (three) times daily. Patient not taking: Reported on 01/15/2017  03/05/16   Maretta BeesGhimire, Shanker M, MD  doxycycline (VIBRA-TABS) 100 MG tablet Take 1 tablet (100 mg total) by mouth 2 (two) times daily. Patient not taking: Reported on 01/15/2017 03/05/16   Maretta BeesGhimire, Shanker M, MD  metFORMIN (GLUCOPHAGE) 500 MG tablet Take 2 tablets (1,000 mg total) by mouth 2 (two) times daily with a meal. Patient not taking: Reported on 01/15/2017 03/05/16   Maretta BeesGhimire, Shanker M, MD    Vital Signs: BP 125/74 (BP Location: Right Wrist)   Pulse 96   Temp 98.7 F (37.1 C) (Oral)   Resp 18   Ht 5\' 10"  (1.778 m)   Wt (!) 330 lb 1.6 oz (149.7 kg)   SpO2 97%   BMI 47.36 kg/m   Physical Exam: Abd: soft, morbidly obese, RLQ drain with serous output.  325cc drained yesterday.  Tender in epigastrium  Imaging: Ct Abdomen Pelvis W Contrast  Result Date: 01/19/2017 CLINICAL DATA:  Follow-up intestinal bypass and anastomosis status EXAM: CT ABDOMEN AND PELVIS WITH CONTRAST TECHNIQUE: Multidetector CT imaging of the abdomen and pelvis was performed using the standard protocol following bolus administration of intravenous contrast. CONTRAST:  100 mL Isovue-300 intravenous COMPARISON:  01/16/2017, 01/15/2017 FINDINGS: Lower chest: Moderate left-sided pleural effusion and small right pleural effusion, slightly increased compared to prior. Bilateral lower lobe consolidations may reflect atelectasis or pneumonia. Heart size is within normal limits. Hepatobiliary: Small hypodense collection along the left margin of the liver. Decreased rim enhancing sub capsular fluid around the liver. Residual lenticular shaped collection measuring 16 cm in  AP by 3.3 cm transverse. No calcified gallstones. No biliary dilatation. Pancreas: Unremarkable. No pancreatic ductal dilatation or surrounding inflammatory changes. Spleen: Normal in size without focal abnormality. Decreased rim enhancing left perisplenic fluid collection, measuring 11.7 cm by 2.2 cm compared with 12.1 x 2.4 cm previously. Adrenals/Urinary Tract:  Adrenal glands are within normal limits. 3 mm stone in the upper pole of the left kidney. No hydronephrosis. The bladder is unremarkable small cyst lower pole left kidney. Stomach/Bowel: Patient is status post gastric bypass. Slight increased size of a gas and fluid collection, adjacent to the suture line, this measures 10.9 x 5.8 cm compared with 10.7 x 5 cm previously. It is now contiguous with a small gas and fluid collection between the gastric pouch and anterior aspect of the liver. Additional hypodense fluid collection measuring about 4.7 by 2.4 cm adjacent to the pyloric region of the excluded stomach. Previously this measured 2.5 x 4.3 cm. There is no evidence for a bowel obstruction. There is mild wall thickening of the hepatic flexure and right colon. Vascular/Lymphatic: Aortic atherosclerosis. No enlarged abdominal or pelvic lymph nodes. Reproductive: Prostate is unremarkable. Other: Increased fluid in the pelvis/presacral space. Interval placement of a right lower percutaneous drainage catheter with markedly decreased in size of the previously noted large peritoneal fluid collection. No discrete residual measurable collection at this time. Small scattered rim enhancing fluid collections are visualized in the right gutter, inferior margin of the liver, and in the left upper quadrant of the abdomen. Musculoskeletal: Degenerative changes. No acute or suspicious findings. IMPRESSION: 1. Interim placement of right lower quadrant percutaneous drainage catheter with significant decrease in size of previously noted large intraperitoneal fluid collection. Interval decrease in size of large right perihepatic and perisplenic rim enhancing fluid collections with residuals as described above. 2. Slight interval increase in size of a gas and fluid containing collection adjacent to the gastric sutures/gastro jejunal anastomosis. Gas and fluid collection now extend slightly more anterior, between the gastric pouch and  the liver. Given that the other collections have decreased in size, findings would be concerning for dehiscence/anastomotic leak. 3. Additional scattered small rim enhancing fluid collections within the abdomen and pelvis. 4. Mild wall thickening of the ascending colon and hepatic flexure suggesting a colitis or reactive inflammation. Residual hazy edema and inflammation of the central and right upper quadrant mesentery. 5. Slight increased pleural effusions with continued bilateral lower lobe atelectasis or pneumonia 6. Nonobstructing stone in the left kidney Electronically Signed   By: Jasmine Pang M.D.   On: 01/19/2017 21:19    Labs:  CBC:  Recent Labs  01/15/17 1420 01/16/17 0411 01/17/17 0407 01/19/17 0516  WBC 8.6 6.7 5.8 6.2  HGB 10.3* 9.5* 8.9* 9.5*  HCT 33.1* 30.4* 29.0* 32.0*  PLT 578* 514* 478* 362    COAGS:  Recent Labs  01/15/17 1420  INR 1.07    BMP:  Recent Labs  01/16/17 0411 01/17/17 0407 01/18/17 0520 01/19/17 0516  NA 134* 135 136 135  K 3.9 3.6 3.7 3.8  CL 96* 97* 99* 99*  CO2 26 30 31 30   GLUCOSE 121* 160* 154* 163*  BUN 9 5* 5* 6  CALCIUM 8.4* 8.0* 7.8* 7.9*  CREATININE 0.87 0.66 0.54* 0.53*  GFRNONAA >60 >60 >60 >60  GFRAA >60 >60 >60 >60    LIVER FUNCTION TESTS:  Recent Labs  01/15/17 1420 01/17/17 0407 01/19/17 0516  BILITOT 1.1 0.7 0.3  AST 27 19 19   ALT 17 13* 11*  ALKPHOS 240* 168* 116  PROT 7.0 5.8* 5.7*  ALBUMIN 2.2* 1.7* 1.6*    Assessment and Plan: 1. Intra-abdominal fluid collections, s/p perc drain x 1  We will plan to proceed with another drain placement in the fluid collection near the GJ anastomosis tomorrow that is concerning for a leak.  He is already NPO.  Will hold his lovenox tomorrow. Risks and benefits discussed with the patient including bleeding, infection, damage to adjacent structures, bowel perforation/fistula connection, and sepsis. All of the patient's questions were answered, patient is agreeable  to proceed. Consent signed and in chart.   Electronically Signed: Letha Cape 01/20/2017, 3:45 PM   I spent a total of 15 Minutes at the the patient's bedside AND on the patient's hospital floor or unit, greater than 50% of which was counseling/coordinating care for intra-abdominal fluid collection

## 2017-01-21 ENCOUNTER — Inpatient Hospital Stay (HOSPITAL_COMMUNITY): Payer: 59

## 2017-01-21 LAB — CBC
HEMATOCRIT: 30.1 % — AB (ref 39.0–52.0)
HEMOGLOBIN: 8.9 g/dL — AB (ref 13.0–17.0)
MCH: 25.5 pg — AB (ref 26.0–34.0)
MCHC: 29.6 g/dL — ABNORMAL LOW (ref 30.0–36.0)
MCV: 86.2 fL (ref 78.0–100.0)
PLATELETS: 332 10*3/uL (ref 150–400)
RBC: 3.49 MIL/uL — AB (ref 4.22–5.81)
RDW: 15.8 % — ABNORMAL HIGH (ref 11.5–15.5)
WBC: 6 10*3/uL (ref 4.0–10.5)

## 2017-01-21 LAB — BASIC METABOLIC PANEL
Anion gap: 8 (ref 5–15)
BUN: 5 mg/dL — ABNORMAL LOW (ref 6–20)
CHLORIDE: 97 mmol/L — AB (ref 101–111)
CO2: 28 mmol/L (ref 22–32)
CREATININE: 0.53 mg/dL — AB (ref 0.61–1.24)
Calcium: 7.9 mg/dL — ABNORMAL LOW (ref 8.9–10.3)
GFR calc non Af Amer: >60 mL/min (ref >60)
Glucose, Bld: 164 mg/dL — ABNORMAL HIGH (ref 65–99)
POTASSIUM: 4.1 mmol/L (ref 3.5–5.1)
Sodium: 133 mmol/L — ABNORMAL LOW (ref 135–145)

## 2017-01-21 LAB — GLUCOSE, CAPILLARY
GLUCOSE-CAPILLARY: 133 mg/dL — AB (ref 65–99)
GLUCOSE-CAPILLARY: 154 mg/dL — AB (ref 65–99)
GLUCOSE-CAPILLARY: 160 mg/dL — AB (ref 65–99)
Glucose-Capillary: 149 mg/dL — ABNORMAL HIGH (ref 65–99)
Glucose-Capillary: 156 mg/dL — ABNORMAL HIGH (ref 65–99)
Glucose-Capillary: 159 mg/dL — ABNORMAL HIGH (ref 65–99)

## 2017-01-21 LAB — IRON AND TIBC
Iron: 15 ug/dL — ABNORMAL LOW (ref 45–182)
Saturation Ratios: 13 % — ABNORMAL LOW (ref 17.9–39.5)
TIBC: 119 ug/dL — ABNORMAL LOW (ref 250–450)
UIBC: 104 ug/dL

## 2017-01-21 LAB — FERRITIN: FERRITIN: 315 ng/mL (ref 24–336)

## 2017-01-21 LAB — MAGNESIUM: Magnesium: 1.8 mg/dL (ref 1.7–2.4)

## 2017-01-21 LAB — FOLATE: Folate: 7.5 ng/mL (ref >5.9)

## 2017-01-21 LAB — VITAMIN B12: Vitamin B-12: 809 pg/mL (ref 180–914)

## 2017-01-21 LAB — PHOSPHORUS: Phosphorus: 5 mg/dL — ABNORMAL HIGH (ref 2.5–4.6)

## 2017-01-21 MED ORDER — TRACE MINERALS CR-CU-MN-SE-ZN 10-1000-500-60 MCG/ML IV SOLN
INTRAVENOUS | Status: AC
  Administered 2017-01-21: 18:00:00 via INTRAVENOUS
  Filled 2017-01-21: qty 2400

## 2017-01-21 MED ORDER — FAT EMULSION 20 % IV EMUL
240.0000 mL | INTRAVENOUS | Status: AC
  Administered 2017-01-21: 240 mL via INTRAVENOUS
  Filled 2017-01-21: qty 250

## 2017-01-21 MED ORDER — FENTANYL CITRATE (PF) 100 MCG/2ML IJ SOLN
INTRAMUSCULAR | Status: AC
  Filled 2017-01-21: qty 2

## 2017-01-21 MED ORDER — HYDROMORPHONE HCL 1 MG/ML IJ SOLN
0.5000 mg | INTRAMUSCULAR | Status: DC | PRN
  Administered 2017-01-21 – 2017-01-22 (×17): 1.5 mg via INTRAVENOUS
  Administered 2017-01-22 (×2): 1 mg via INTRAVENOUS
  Administered 2017-01-22 (×4): 1.5 mg via INTRAVENOUS
  Administered 2017-01-23: 1 mg via INTRAVENOUS
  Administered 2017-01-23 (×5): 1.5 mg via INTRAVENOUS
  Administered 2017-01-23 (×2): 1 mg via INTRAVENOUS
  Administered 2017-01-23 – 2017-01-25 (×18): 1.5 mg via INTRAVENOUS
  Administered 2017-01-25: 1 mg via INTRAVENOUS
  Administered 2017-01-25 (×2): 1.5 mg via INTRAVENOUS
  Filled 2017-01-21 (×6): qty 2
  Filled 2017-01-21: qty 1
  Filled 2017-01-21 (×6): qty 2
  Filled 2017-01-21: qty 1
  Filled 2017-01-21: qty 2
  Filled 2017-01-21: qty 1
  Filled 2017-01-21 (×6): qty 2
  Filled 2017-01-21 (×2): qty 1
  Filled 2017-01-21 (×4): qty 2
  Filled 2017-01-21: qty 1
  Filled 2017-01-21 (×7): qty 2
  Filled 2017-01-21: qty 1
  Filled 2017-01-21 (×12): qty 2
  Filled 2017-01-21: qty 1
  Filled 2017-01-21 (×2): qty 2

## 2017-01-21 MED ORDER — SODIUM CHLORIDE 0.9% FLUSH
5.0000 mL | Freq: Three times a day (TID) | INTRAVENOUS | Status: DC
  Administered 2017-01-21 – 2017-02-03 (×23): 5 mL via INTRAVENOUS

## 2017-01-21 MED ORDER — MIDAZOLAM HCL 2 MG/2ML IJ SOLN
INTRAMUSCULAR | Status: AC
  Filled 2017-01-21: qty 2

## 2017-01-21 MED ORDER — MIDAZOLAM HCL 2 MG/2ML IJ SOLN
INTRAMUSCULAR | Status: AC | PRN
  Administered 2017-01-21: 2 mg via INTRAVENOUS

## 2017-01-21 MED ORDER — FENTANYL CITRATE (PF) 100 MCG/2ML IJ SOLN
INTRAMUSCULAR | Status: AC | PRN
  Administered 2017-01-21: 100 ug via INTRAVENOUS

## 2017-01-21 NOTE — Progress Notes (Signed)
Nutrition Follow-up  DOCUMENTATION CODES:   Morbid obesity  INTERVENTION:  - Continue TPN per Pharmacy  NUTRITION DIAGNOSIS:   Inadequate oral intake related to  (peritonitis ) as evidenced by NPO status.  Ongoing   GOAL:   Patient will meet greater than or equal to 90% of their needs  Met via TPN  MONITOR:   Diet advancement, Labs, Weight trends, Skin, I & O's (TPN prescription )  REASON FOR ASSESSMENT:   Consult New TPN/TNA  ASSESSMENT:   41 yo Male with PMH of type II DM, HLD, OSA on nightly CPAP; admitted with sepsis secondary to postop intra-abdominal abscesses and peritonitis.  Spoke with pt at bedside.  Pt reports diarrhea and slight nausea but no vomiting. Pt presents with 2 IR drains  Pt's TPN has continued to advance. Pt is currently receiving TPN with Clinimix E 5/15 at 172m/hr and 20% lipid emulsion at 265mhr x 12 hrs Providing 120 grams of protein and 2184 calories per day.  Meets 95% minimum estimated energy needs and 100% minimum estimated protein needs   Labs reviewed; CBG 133-186, Na 133, Phosphorous 5.0 Medications reviewed; Sliding scale insulin, Zofran PRN  Diet Order:  TPN (CLINIMIX-E) Adult TPN (CLINIMIX-E) Adult Diet NPO time specified Except for: Ice Chips  Skin:  Wound (see comment) (MASD to scrotum)  Last BM:  01/20/17  Height:   Ht Readings from Last 1 Encounters:  01/15/17 '5\' 10"'  (1.778 m)    Weight:   Wt Readings from Last 1 Encounters:  01/21/17 (!) 331 lb 12.8 oz (150.5 kg)    Ideal Body Weight:  75.4 kg  BMI:  Body mass index is 47.61 kg/m.  Estimated Nutritional Needs:   Kcal:  230071-2197Protein:  115-130 gm  Fluid:  2.3-2.5 L  EDUCATION NEEDS:   No education needs identified at this time  AlParks RangerMS, RDN, LDN 01/21/2017 4:46 PM

## 2017-01-21 NOTE — Progress Notes (Signed)
Patient refused CPAP for tonight 

## 2017-01-21 NOTE — Progress Notes (Signed)
CC:  Abdominal pain  Subjective: Doing well after second IR drain placed.  Abdomen is not really all that tender now.  Drain just placed today has serosanguinous fluid in it.  The original lower drain has cloudy dark fluid coming from it.   He would really like to be able to have something to drink.    Objective: Vital signs in last 24 hours: Temp:  [97.7 F (36.5 C)-98.3 F (36.8 C)] 98.1 F (36.7 C) (09/05 0502) Pulse Rate:  [91-102] 100 (09/05 0935) Resp:  [16-18] 18 (09/05 0935) BP: (134-154)/(80-86) 148/86 (09/05 0935) SpO2:  [96 %-100 %] 98 % (09/05 0935) Weight:  [150.5 kg (331 lb 12.8 oz)] 150.5 kg (331 lb 12.8 oz) (09/05 0502) Last BM Date: 01/20/17 NPO IV - 235 Urine 1500 Drain:  150 Afebrile, VSS Labs stable  Intake/Output from previous day: 09/04 0701 - 09/05 0700 In: 235 [I.V.:235] Out: 1650 [Urine:1500; Drains:150] Intake/Output this shift: Total I/O In: -  Out: 100 [Drains:100]  General appearance: alert, cooperative and no distress Resp: clear to auscultation bilaterally GI:  soft, sore, much better than at admit.  drainage as noted above.   Lab Results:   Recent Labs  01/19/17 0516 01/21/17 0455  WBC 6.2 6.0  HGB 9.5* 8.9*  HCT 32.0* 30.1*  PLT 362 332    BMET  Recent Labs  01/20/17 1745 01/21/17 0455  NA 125* 133*  K 6.1* 4.1  CL 93* 97*  CO2 21* 28  GLUCOSE 795* 164*  BUN 7 5*  CREATININE 0.59* 0.53*  CALCIUM 7.8* 7.9*   PT/INR  Recent Labs  01/20/17 1744  LABPROT 15.0  INR 1.19     Recent Labs Lab 01/15/17 1420 01/17/17 0407 01/19/17 0516  AST 27 19 19   ALT 17 13* 11*  ALKPHOS 240* 168* 116  BILITOT 1.1 0.7 0.3  PROT 7.0 5.8* 5.7*  ALBUMIN 2.2* 1.7* 1.6*     Lipase  No results found for: LIPASE     Medications: . chlorhexidine  15 mL Mouth Rinse BID  . Chlorhexidine Gluconate Cloth  6 each Topical Daily  . enoxaparin (LOVENOX) injection  40 mg Subcutaneous Q12H  . hydrocortisone   Rectal BID   . insulin aspart  0-20 Units Subcutaneous Q4H  . mouth rinse  15 mL Mouth Rinse q12n4p  . pneumococcal 23 valent vaccine  0.5 mL Intramuscular Tomorrow-1000  . sodium chloride flush  5 mL Intravenous Q8H  . sodium chloride flush  5 mL Intravenous Q8H   Scheduled Meds: . chlorhexidine  15 mL Mouth Rinse BID  . Chlorhexidine Gluconate Cloth  6 each Topical Daily  . enoxaparin (LOVENOX) injection  40 mg Subcutaneous Q12H  . hydrocortisone   Rectal BID  . insulin aspart  0-20 Units Subcutaneous Q4H  . mouth rinse  15 mL Mouth Rinse q12n4p  . pneumococcal 23 valent vaccine  0.5 mL Intramuscular Tomorrow-1000  . sodium chloride flush  5 mL Intravenous Q8H  . sodium chloride flush  5 mL Intravenous Q8H   Continuous Infusions: . sodium chloride 35 mL/hr at 01/20/17 0234  . Marland KitchenTPN (CLINIMIX-E) Adult     And  . fat emulsion    . methocarbamol (ROBAXIN)  IV 1,000 mg (01/21/17 1357)  . piperacillin-tazobactam (ZOSYN)  IV 3.375 g (01/21/17 1400)  . Marland KitchenTPN (CLINIMIX-E) Adult 100 mL/hr at 01/20/17 1725   Anti-infectives    Start     Dose/Rate Route Frequency Ordered Stop   01/15/17 1815  piperacillin-tazobactam (ZOSYN) IVPB 3.375 g  Status:  Discontinued     3.375 g 12.5 mL/hr over 240 Minutes Intravenous Every 8 hours 01/15/17 1804 01/15/17 1804   01/15/17 1630  piperacillin-tazobactam (ZOSYN) IVPB 3.375 g     3.375 g 12.5 mL/hr over 240 Minutes Intravenous Every 8 hours 01/15/17 1616       PRN Meds:.fentaNYL, HYDROcodone-acetaminophen, HYDROmorphone (DILAUDID) injection, levalbuterol, midazolam, ondansetron **OR** ondansetron (ZOFRAN) IV  Assessment/Plan Multiple intra-abdominal abscesses Early Sepsis Status post Roux-en-Y/gastric bypass, 12/08/16, Dr. Clent RidgesWalsh Fluid overload/ seroma with drainage 8/14-8/25/18 [facility] Sours- [place] S/P perc drain 01/16/17 S/p IR drain 01/21/17  - 100 cc purulent fluid drained in the IR suite - continue TPN - drain with 225 cc output in 24 h - continue drain - WBC  6.2, afebrile - mobilize - fluid collection next to GJ anastomosis - repeat CT showed decrease in large right perihepatic and perisplenic abscesses, slight interval increase in GJ collection with fluid and gas, concerning for anastomotic leak, colitis   DM HLD Morbid Obesity Body mass index: 47.6 OSA with hx of CPAP FEN - NPO, TPN VTE - SCDs, lovenox ID - IV Zosyn (8/30>> day 7   Plan:  I am going to give him some ice chips and see how he does, neither drain looks feculent to me.  Recheck labs in AM.  LOS: 6 days    Albert White 01/21/2017 [phone]

## 2017-01-21 NOTE — Progress Notes (Signed)
PROGRESS NOTE    Albert White  ZOX:[id number] DOB: [birth year] DOA: 01/15/2017 PCP: Alysia Penna, MD   Brief Narrative:  41 year old male who presented with abdominal pain. Is known to have Albert White recent Roux-en-Y/gastric bypass on 12/08/16 in [place] by Dr. Franki Cabot, rehospitalized 12/30/16-01/10/17 for anasarca, abdominal pain, fever of 103F and postoperative seroma for which Albert White percutaneous drain was placed. Also has Albert White diagnosis of type 2 diabetes mellitus, dyslipidemia and obstructive sleep apnea. After his discharge patient complained of persistent abdominal pain, associated with nausea, emesis and decreased appetite, outpatient CT of the abdomen showedgas containing 10.7 x 5 cm abscess abutting the posterior margin of the GJ anastomosis worrisome for dehiscence, diffuse peritonitis with large gas containing abscess throughout the peritoneal cavity including Albert White right perihepatic abscess compressing the right liver lobe and provoking anileus, findings that prompted rehospitalization.   Patient was admitted to the hospital with the working diagnosis of abdominal sepsis due to peritonitis, and intra-abdominal abscess related to Albert White postoperative complication of Roux-en-Y/gastric bypass (12/08/16).    Assessment & Plan:   Active Problems:   Postprocedural intraabdominal abscess   1. Sepsis, present on admission due to peritonitis and intra-abdominal abscess. Antibiotic therapy with IV Zosyn #5. Percutaneous drain in place with purulent drainage in RLQ.  Epigastric drain placed today with serosanguinous drainage. Cultures (8/31 with rare gardenella vaginalis and rare strep constellatus). Follow up ct with decreased in size of intra-abdominal collections, but positive findings cw dehiscence/anastomotic leak, at the gastro jejunal anastomosis. S/p IR drainage today with additional drain placed.  Repeat cx pending  2. Thickening of Ascending colon/hepatic flexure concerning for colitis vs  reactive inflammation:  Currently on zosyn, may be related to peritonitis/intrabdominal abscess above.  Had diarrhea yesterday, but resolved today.  CTM.    3. Pleural effusions with lower lobe atelectasis vs pneumonia:  f/u CXR   2. Type 2 diabetes mellitus. Glucose cover and monitoring with sliding scale insulin, continue parenteral nutrition, capillary glucose 179, 186, 166, 154, 136.   3. Obstructive sleep apnea. Cpap,  oxymetry monitoring and as needed supplemental 02 per [place], to keep 02 saturation above 92%, continue incentive spirometer.   4. Chronic multifactorial anemia. No signs of active bleeding, will check cell counts in am.  5. Morbid obesity. SP bariatric surgery, high risk for inpatient complications  6. TPN per pharmacy  DVT prophylaxis: lovenox Code Status: full  Family Communication: wife Disposition Plan: pending further improvement   Consultants:   Surgery  IR  pharmacy  Procedures: (Don't include imaging studies which can be auto populated. Include things that cannot be auto populated i.e. Albert, Carotid and venous dopplers, Foley, Bipap, HD, tubes/drains, wound vac, central lines etc)  Perc drain placement  Antimicrobials: (specify start and planned stop date. Auto populated tables are space occupying and do not give end dates)  zosyn    Subjective: Still with abd pain, slightly worse after procedure.  No fevers, chills.  Had diarrhea yesterday, none today.  Objective: Vitals:   01/21/17 0921 01/21/17 0926 01/21/17 0930 01/21/17 0935  BP: (!) 152/85 (!) 145/81 (!) 154/82 (!) 148/86  Pulse: 91 98 (!) 102 100  Resp: 18 16 18 18   Temp:      TempSrc:      SpO2: 99% 96% 96% 98%  Weight:      Height:        Intake/Output Summary (Last 24 hours) at 01/21/17 1150 Last data filed at 01/21/17 0937  Gross per 24 hour  Intake              235 ml  Output             1700 ml  Net            -1465 ml   Filed Weights   01/19/17 0442 01/20/17 0454  01/21/17 0502  Weight: (!) 158.2 kg (348 lb 12.8 oz) (!) 149.7 kg (330 lb 1.6 oz) (!) 150.5 kg (331 lb 12.8 oz)    Examination:  General exam: Appears calm, but uncomfortable Respiratory system: Clear to auscultation. Respiratory effort normal. Cardiovascular system: S1 & S2 heard, RRR. No JVD, murmurs, rubs, gallops or clicks. No pedal edema. Gastrointestinal system: Abdomen is diffusely tender to palpation.  Drain at epigastric region and in RLQ. No organomegaly or masses felt. Normal bowel sounds heard. Central nervous system: Alert and oriented. No focal neurological deficits. Extremities: 1+ LEE. Skin: No rashes, lesions or ulcers Psychiatry: Judgement and insight appear normal. Mood & affect appropriate.     Data Reviewed: I have personally reviewed following labs and imaging studies  CBC:  Recent Labs Lab 01/15/17 1420 01/16/17 0411 01/17/17 0407 01/19/17 0516 01/21/17 0455  WBC 8.6 6.7 5.8 6.2 6.0  NEUTROABS 6.3  --  4.1 4.3  --   HGB 10.3* 9.5* 8.9* 9.5* 8.9*  HCT 33.1* 30.4* 29.0* 32.0* 30.1*  MCV 86.2 86.6 86.3 86.5 86.2  PLT 578* 514* 478* 362 332   Basic Metabolic Panel:  Recent Labs Lab 01/17/17 0407 01/18/17 0520 01/19/17 0516 01/20/17 1745 01/21/17 0455  NA 135 136 135 125* 133*  K 3.6 3.7 3.8 6.1* 4.1  CL 97* 99* 99* 93* 97*  CO2 30 31 30  21* 28  GLUCOSE 160* 154* 163* 795* 164*  BUN 5* 5* 6 7 5*  CREATININE 0.66 0.54* 0.53* 0.59* 0.53*  CALCIUM 8.0* 7.8* 7.9* 7.8* 7.9*  MG 1.9 1.8 1.7 2.0 1.8  PHOS 3.8 4.1 4.6 6.9* 5.0*   GFR: Estimated Creatinine Clearance: 180.6 mL/min (Albert White) (by C-G formula based on SCr of 0.53 mg/dL (L)). Liver Function Tests:  Recent Labs Lab 01/15/17 1420 01/17/17 0407 01/19/17 0516  AST 27 19 19   ALT 17 13* 11*  ALKPHOS 240* 168* 116  BILITOT 1.1 0.7 0.3  PROT 7.0 5.8* 5.7*  ALBUMIN 2.2* 1.7* 1.6*   No results for input(s): LIPASE, AMYLASE in the last 168 hours. No results for input(s): AMMONIA in the last  168 hours. Coagulation Profile:  Recent Labs Lab 01/15/17 1420 01/20/17 1744  INR 1.07 1.19   Cardiac Enzymes: No results for input(s): CKTOTAL, CKMB, CKMBINDEX, TROPONINI in the last 168 hours. BNP (last 3 results) No results for input(s): PROBNP in the last 8760 hours. HbA1C: No results for input(s): HGBA1C in the last 72 hours. CBG:  Recent Labs Lab 01/20/17 1625 01/20/17 2021 01/21/17 0058 01/21/17 0405 01/21/17 0805  GLUCAP 136* 160* 149* 159* 156*   Lipid Profile:  Recent Labs  01/19/17 0516  TRIG 140   Thyroid Function Tests: No results for input(s): TSH, T4TOTAL, FREET4, T3FREE, THYROIDAB in the last 72 hours. Anemia Panel: No results for input(s): VITAMINB12, FOLATE, FERRITIN, TIBC, IRON, RETICCTPCT in the last 72 hours. Sepsis Labs:  Recent Labs Lab 01/15/17 1445 01/15/17 1726  LATICACIDVEN 1.65 1.19    Recent Results (from the past 240 hour(s))  Culture, blood (Routine x 2)     Status: None   Collection Time: 01/15/17  2:20 PM  Result Value Ref  Range Status   Specimen Description BLOOD LEFT HAND  Final   Special Requests   Final    BOTTLES DRAWN AEROBIC AND ANAEROBIC Blood Culture adequate volume   Culture NO GROWTH 5 DAYS  Final   Report Status 01/20/2017 FINAL  Final  Culture, blood (Routine x 2)     Status: None   Collection Time: 01/15/17  3:25 PM  Result Value Ref Range Status   Specimen Description BLOOD RIGHT HAND  Final   Special Requests   Final    BOTTLES DRAWN AEROBIC AND ANAEROBIC Blood Culture adequate volume   Culture NO GROWTH 5 DAYS  Final   Report Status 01/20/2017 FINAL  Final  MRSA PCR Screening     Status: None   Collection Time: 01/15/17 11:04 PM  Result Value Ref Range Status   MRSA by PCR NEGATIVE NEGATIVE Final    Comment:        The GeneXpert MRSA Assay (FDA approved for NASAL specimens only), is one component of Jolee Critcher comprehensive MRSA colonization surveillance program. It is not intended to diagnose  MRSA infection nor to guide or monitor treatment for MRSA infections.   Aerobic/Anaerobic Culture (surgical/deep wound)     Status: None   Collection Time: 01/16/17  2:21 PM  Result Value Ref Range Status   Specimen Description ABSCESS ABDOMEN  Final   Special Requests INTRA ABDOMINAL ABSCESS  Final   Gram Stain   Final    ABUNDANT WBC PRESENT, PREDOMINANTLY PMN NO ORGANISMS SEEN    Culture   Final    RARE GARDNERELLA VAGINALIS Standardized susceptibility testing for this organism is not available. RARE STREPTOCOCCUS CONSTELLATUS CALL MICROBIOLOGY LAB IF SENSITIVITIES ARE REQUIRED.    Report Status 01/20/2017 FINAL  Final  Aerobic/Anaerobic Culture (surgical/deep wound)     Status: None (Preliminary result)   Collection Time: 01/21/17 10:04 AM  Result Value Ref Range Status   Specimen Description ABSCESS  Final   Special Requests DRAIN POST CT GUIDED PLACEMENT  Final   Gram Stain PENDING  Incomplete   Culture PENDING  Incomplete   Report Status PENDING  Incomplete         Radiology Studies: Ct Abdomen Pelvis W Contrast  Result Date: 01/19/2017 CLINICAL DATA:  Follow-up intestinal bypass and anastomosis status EXAM: CT ABDOMEN AND PELVIS WITH CONTRAST TECHNIQUE: Multidetector CT imaging of the abdomen and pelvis was performed using the standard protocol following bolus administration of intravenous contrast. CONTRAST:  100 mL Isovue-300 intravenous COMPARISON:  01/16/2017, 01/15/2017 FINDINGS: Lower chest: Moderate left-sided pleural effusion and small right pleural effusion, slightly increased compared to prior. Bilateral lower lobe consolidations may reflect atelectasis or pneumonia. Heart size is within normal limits. Hepatobiliary: Small hypodense collection along the left margin of the liver. Decreased rim enhancing sub capsular fluid around the liver. Residual lenticular shaped collection measuring 16 cm in AP by 3.3 cm transverse. No calcified gallstones. No biliary  dilatation. Pancreas: Unremarkable. No pancreatic ductal dilatation or surrounding inflammatory changes. Spleen: Normal in size without focal abnormality. Decreased rim enhancing left perisplenic fluid collection, measuring 11.7 cm by 2.2 cm compared with 12.1 x 2.4 cm previously. Adrenals/Urinary Tract: Adrenal glands are within normal limits. 3 mm stone in the upper pole of the left kidney. No hydronephrosis. The bladder is unremarkable small cyst lower pole left kidney. Stomach/Bowel: Patient is status post gastric bypass. Slight increased size of Zalia Hautala gas and fluid collection, adjacent to the suture line, this measures 10.9 x 5.8 cm compared with 10.7  x 5 cm previously. It is now contiguous with Dearies Meikle small gas and fluid collection between the gastric pouch and anterior aspect of the liver. Additional hypodense fluid collection measuring about 4.7 by 2.4 cm adjacent to the pyloric region of the excluded stomach. Previously this measured 2.5 x 4.3 cm. There is no evidence for Irena Gaydos bowel obstruction. There is mild wall thickening of the hepatic flexure and right colon. Vascular/Lymphatic: Aortic atherosclerosis. No enlarged abdominal or pelvic lymph nodes. Reproductive: Prostate is unremarkable. Other: Increased fluid in the pelvis/presacral space. Interval placement of Mindie Rawdon right lower percutaneous drainage catheter with markedly decreased in size of the previously noted large peritoneal fluid collection. No discrete residual measurable collection at this time. Small scattered rim enhancing fluid collections are visualized in the right gutter, inferior margin of the liver, and in the left upper quadrant of the abdomen. Musculoskeletal: Degenerative changes. No acute or suspicious findings. IMPRESSION: 1. Interim placement of right lower quadrant percutaneous drainage catheter with significant decrease in size of previously noted large intraperitoneal fluid collection. Interval decrease in size of large right perihepatic and  perisplenic rim enhancing fluid collections with residuals as described above. 2. Slight interval increase in size of Aneya Daddona gas and fluid containing collection adjacent to the gastric sutures/gastro jejunal anastomosis. Gas and fluid collection now extend slightly more anterior, between the gastric pouch and the liver. Given that the other collections have decreased in size, findings would be concerning for dehiscence/anastomotic leak. 3. Additional scattered small rim enhancing fluid collections within the abdomen and pelvis. 4. Mild wall thickening of the ascending colon and hepatic flexure suggesting Meilech Virts colitis or reactive inflammation. Residual hazy edema and inflammation of the central and right upper quadrant mesentery. 5. Slight increased pleural effusions with continued bilateral lower lobe atelectasis or pneumonia 6. Nonobstructing stone in the left kidney Electronically Signed   By: Albert White  White M.D.   On: 01/19/2017 21:19   Ct Image Guided Drainage By Percutaneous Catheter  Result Date: 01/21/2017 INDICATION: History of reactive surgery performed at outside institution, post placement of CT-guided percutaneous drainage catheter within the right lower abdomen / pelvis on 01/16/2017 with subsequent abdominal CT performed 01/19/2017 demonstrating Gorman Safi residual perigastric abscess. Request made for placement of Onesimo Lingard additional percutaneous drainage catheter for infection source control purposes. EXAM: CT IMAGE GUIDED DRAINAGE BY PERCUTANEOUS CATHETER COMPARISON:  CT abdomen pelvis - 01/19/2017; 01/15/2017; CT-guided percutaneous drainage catheter placement - 01/16/2017 MEDICATIONS: The patient is currently admitted to the hospital and receiving intravenous antibiotics. The antibiotics were administered within an appropriate time frame prior to the initiation of the procedure. ANESTHESIA/SEDATION: Moderate (conscious) sedation was employed during this procedure. Albert White total of Versed 2 mg and Fentanyl 100 mcg was  administered intravenously. Moderate Sedation Time: 15 minutes. The patient's level of consciousness and vital signs were monitored continuously by radiology nursing throughout the procedure under my direct supervision. CONTRAST:  None COMPLICATIONS: None immediate. PROCEDURE: Informed written consent was obtained from the patient after Albert White discussion of the risks, benefits and alternatives to treatment. The patient was placed supine on the CT gantry and Albert White pre procedural CT was performed re-demonstrating the known abscess/fluid collection within the upper abdomen adjacent to the stomach and gastric anastomosis with dominant component measuring approximately 3.7 x 10.6 cm (image 24, series 3). The procedure was planned. Albert White timeout was performed prior to the initiation of the procedure. The skin overlying the midline of the upper abdomen was prepped and draped in the usual sterile fashion. The overlying soft  tissues were anesthetized with 1% lidocaine with epinephrine. Appropriate trajectory was planned with the use of Albert White 22 gauge spinal needle. An 18 gauge trocar needle was advanced into the abscess/fluid collection and Albert White short Amplatz super stiff wire was coiled within the collection. Appropriate positioning was confirmed with Albert White limited CT scan. The tract was serially dilated allowing placement of Albert White 10 [place] all-purpose drainage catheter. Appropriate positioning was confirmed with Albert White limited postprocedural CT scan. Approximately 100 cc of purulent fluid was aspirated. The tube was connected to Myliah Medel drainage bag and sutured in place. Bradyn Soward dressing was placed. The patient tolerated the procedure well without immediate post procedural complication. IMPRESSION: Successful CT guided placement of Aviona Martenson 10 [place] all purpose drain catheter into the residual perigastric abscess with aspiration of 100 cc of purulent fluid. Samples were sent to the laboratory as requested by the ordering clinical team. Electronically Signed   By: Simonne Come M.D.   On: 01/21/2017 10:11        Scheduled Meds: . chlorhexidine  15 mL Mouth Rinse BID  . Chlorhexidine Gluconate Cloth  6 each Topical Daily  . enoxaparin (LOVENOX) injection  40 mg Subcutaneous Q12H  . fentaNYL      . hydrocortisone   Rectal BID  . insulin aspart  0-20 Units Subcutaneous Q4H  . mouth rinse  15 mL Mouth Rinse q12n4p  . midazolam      . pneumococcal 23 valent vaccine  0.5 mL Intramuscular Tomorrow-1000  . sodium chloride flush  5 mL Intravenous Q8H   Continuous Infusions: . sodium chloride 35 mL/hr at 01/20/17 0234  . Marland KitchenTPN (CLINIMIX-E) Adult     And  . fat emulsion    . methocarbamol (ROBAXIN)  IV 1,000 mg (01/21/17 9562)  . piperacillin-tazobactam (ZOSYN)  IV 3.375 g (01/21/17 0653)  . Marland KitchenTPN (CLINIMIX-E) Adult 100 mL/hr at 01/20/17 1725     LOS: 6 days    Time spent: 35 min    Dayannara Pascal Grier Mitts, MD [facility] [phone]   If 7PM-7AM, please contact night-coverage [email or web address] Password [email or web address] 01/21/2017, 11:50 AM

## 2017-01-21 NOTE — Procedures (Signed)
Pre procedural Dx: Post-op abdominal abscess Post procedural Dx: Same  Technically successful CT guided placed of a 10 Fr drainage catheter placement into the peri-gastric abscess yielding 100 cc of purulent material.    All aspirated samples sent to the laboratory for analysis.    EBL: Minimal  Complications: None immediate  Katherina RightJay Avontae Burkhead, MD Pager #: [phone]

## 2017-01-22 ENCOUNTER — Inpatient Hospital Stay (HOSPITAL_COMMUNITY): Payer: 59

## 2017-01-22 LAB — CBC
HCT: 29.3 % — ABNORMAL LOW (ref 39.0–52.0)
Hemoglobin: 8.9 g/dL — ABNORMAL LOW (ref 13.0–17.0)
MCH: 25.9 pg — AB (ref 26.0–34.0)
MCHC: 30.4 g/dL (ref 30.0–36.0)
MCV: 85.4 fL (ref 78.0–100.0)
PLATELETS: 352 10*3/uL (ref 150–400)
RBC: 3.43 MIL/uL — ABNORMAL LOW (ref 4.22–5.81)
RDW: 15.9 % — AB (ref 11.5–15.5)
WBC: 5.3 10*3/uL (ref 4.0–10.5)

## 2017-01-22 LAB — COMPREHENSIVE METABOLIC PANEL
ALT: 18 U/L (ref 17–63)
AST: 30 U/L (ref 15–41)
Albumin: 1.6 g/dL — ABNORMAL LOW (ref 3.5–5.0)
Alkaline Phosphatase: 139 U/L — ABNORMAL HIGH (ref 38–126)
Anion gap: 7 (ref 5–15)
BUN: 5 mg/dL — AB (ref 6–20)
CHLORIDE: 98 mmol/L — AB (ref 101–111)
CO2: 29 mmol/L (ref 22–32)
Calcium: 8.1 mg/dL — ABNORMAL LOW (ref 8.9–10.3)
Creatinine, Ser: 0.53 mg/dL — ABNORMAL LOW (ref 0.61–1.24)
Glucose, Bld: 161 mg/dL — ABNORMAL HIGH (ref 65–99)
POTASSIUM: 4.1 mmol/L (ref 3.5–5.1)
SODIUM: 134 mmol/L — AB (ref 135–145)
TOTAL PROTEIN: 5.7 g/dL — AB (ref 6.5–8.1)
Total Bilirubin: 0.6 mg/dL (ref 0.3–1.2)

## 2017-01-22 LAB — MAGNESIUM: MAGNESIUM: 1.7 mg/dL (ref 1.7–2.4)

## 2017-01-22 LAB — GLUCOSE, CAPILLARY
GLUCOSE-CAPILLARY: 126 mg/dL — AB (ref 65–99)
GLUCOSE-CAPILLARY: 129 mg/dL — AB (ref 65–99)
GLUCOSE-CAPILLARY: 156 mg/dL — AB (ref 65–99)
GLUCOSE-CAPILLARY: 162 mg/dL — AB (ref 65–99)
Glucose-Capillary: 134 mg/dL — ABNORMAL HIGH (ref 65–99)
Glucose-Capillary: 151 mg/dL — ABNORMAL HIGH (ref 65–99)
Glucose-Capillary: 148 mg/dL — ABNORMAL HIGH (ref 65–99)

## 2017-01-22 LAB — PHOSPHORUS: Phosphorus: 5.3 mg/dL — ABNORMAL HIGH (ref 2.5–4.6)

## 2017-01-22 LAB — BRAIN NATRIURETIC PEPTIDE: B Natriuretic Peptide: 28.9 pg/mL (ref 0.0–100.0)

## 2017-01-22 MED ORDER — TRACE MINERALS CR-CU-MN-SE-ZN 10-1000-500-60 MCG/ML IV SOLN
INTRAVENOUS | Status: AC
  Administered 2017-01-22: 17:00:00 via INTRAVENOUS
  Filled 2017-01-22: qty 2400

## 2017-01-22 MED ORDER — VANCOMYCIN HCL 10 G IV SOLR
2500.0000 mg | Freq: Once | INTRAVENOUS | Status: AC
  Administered 2017-01-22: 2500 mg via INTRAVENOUS
  Filled 2017-01-22: qty 2500

## 2017-01-22 MED ORDER — FAT EMULSION 20 % IV EMUL
240.0000 mL | INTRAVENOUS | Status: AC
  Administered 2017-01-22: 240 mL via INTRAVENOUS
  Filled 2017-01-22: qty 250

## 2017-01-22 MED ORDER — VANCOMYCIN HCL 10 G IV SOLR
1250.0000 mg | Freq: Three times a day (TID) | INTRAVENOUS | Status: DC
  Administered 2017-01-22 – 2017-01-23 (×3): 1250 mg via INTRAVENOUS
  Filled 2017-01-22 (×4): qty 1250

## 2017-01-22 NOTE — Progress Notes (Signed)
Pharmacy Antibiotic Note Albert PetrinSteven T Gero is a 41 y.o. male admitted on 01/15/2017 with abdominal pain. Found to have an intra-abdominal abscess and peritonitis. Recently had gastric bypass in July of this year. He has a drain in his peri-gastric abscess, placed yesterday. This is draining purulent fluid that was sent for culture - preliminary gram stain in showing GPC, GNR and gram variable rod. Pt is already on zosyn (day #8), vancomycin will also be initiated until the organisms are speciated.    Plan: Vancomycin 2500 mg IV x1 then 1250 mg IV q8h Continue zosyn 3.375 g IV q8h Monitor renal fx, cultures, obtain VT at Css   Height: 5\' 10"  (177.8 cm) Weight: (!) 343 lb 12.8 oz (155.9 kg) IBW/kg (Calculated) : 73  Temp (24hrs), Avg:98.2 F (36.8 C), Min:97.9 F (36.6 C), Max:98.4 F (36.9 C)   Recent Labs Lab 01/15/17 1445 01/15/17 1726 01/16/17 0411 01/17/17 0407 01/18/17 0520 01/19/17 0516 01/20/17 1745 01/21/17 0455 01/22/17 0553  WBC  --   --  6.7 5.8  --  6.2  --  6.0 5.3  CREATININE  --   --  0.87 0.66 0.54* 0.53* 0.59* 0.53* 0.53*  LATICACIDVEN 1.65 1.19  --   --   --   --   --   --   --      Antimicrobials this admission: 8/30 zosyn > 9/6 vancomycin >  Dose adjustments this admission: N/A  Microbiology results: 8/30 BCx x2 : ngF 8/30 MRSA PCR: neg 8/31 abd abscess: gardenerella 9/5 abd abscess: pending   Baldemar FridayMasters, Hisashi Amadon M 01/22/2017 11:47 AM

## 2017-01-22 NOTE — Progress Notes (Signed)
Referring Physician(s):  Dr. Andrey CampanileWilson  Supervising Physician: Malachy MoanMcCullough, Heath  [facility]  Chief Complaint:  Abdominal abscess s/p drain placement 8/31 by Dr. Deanne CofferHassell, second drain placed 9/5 by Grace IsaacWatts  Subjective: Patient continues with abdominal pain.  Reports slight improvement since second drain placement.   Allergies: Patient has no known allergies.  Medications: Prior to Admission medications   Medication Sig Start Date End Date Taking? Authorizing Provider  acetaminophen (TYLENOL) 325 MG tablet Take 2 tablets (650 mg total) by mouth every 8 (eight) hours. Patient taking differently: Take 650 mg by mouth every 8 (eight) hours as needed for moderate pain or headache.  02/27/16  Yes Mesner, Barbara CowerJason, MD  atorvastatin (LIPITOR) 10 MG tablet Take 10 mg by mouth every evening.  09/18/15  Yes [provider]  calcium carbonate (TUMS - DOSED IN MG ELEMENTAL CALCIUM) 500 MG chewable tablet Chew 500 mg by mouth as needed for heartburn.   Yes [provider]  nystatin (MYCOSTATIN) 100000 UNIT/ML suspension Take by mouth as needed. Thrush. 1 tsp 11/27/16  Yes [provider]  omeprazole (PRILOSEC) 20 MG capsule Take 20 mg by mouth 2 (two) times daily. 11/27/16  Yes [provider]  OVER THE COUNTER MEDICATION Apply 1 application topically as needed (eczema). [employer] Eczema Cream    Yes [provider]  oxyCODONE (ROXICODONE) 5 MG immediate release tablet Take 1 tablet (5 mg total) by mouth every 6 (six) hours as needed for severe pain. 03/05/16  Yes Ghimire, Werner LeanShanker M, MD  traZODone (DESYREL) 100 MG tablet Take 100 mg by mouth at bedtime.   Yes [provider]  ursodiol (ACTIGALL) 300 MG capsule Take 300 mg by mouth 2 (two) times daily. 11/27/16  Yes [provider]  cephALEXin (KEFLEX) 500 MG capsule Take 1 capsule (500 mg total) by mouth 3 (three) times daily. Patient not taking: Reported on 01/15/2017 03/05/16    Maretta BeesGhimire, Shanker M, MD  doxycycline (VIBRA-TABS) 100 MG tablet Take 1 tablet (100 mg total) by mouth 2 (two) times daily. Patient not taking: Reported on 01/15/2017 03/05/16   Maretta BeesGhimire, Shanker M, MD  metFORMIN (GLUCOPHAGE) 500 MG tablet Take 2 tablets (1,000 mg total) by mouth 2 (two) times daily with a meal. Patient not taking: Reported on 01/15/2017 03/05/16   Maretta BeesGhimire, Shanker M, MD     Vital Signs: BP 138/90 (BP Location: Left Arm)   Pulse 98   Temp 98.6 F (37 C) (Oral)   Resp 18   Ht 5\' 10"  (1.778 m)   Wt (!) 343 lb 12.8 oz (155.9 kg)   SpO2 94%   BMI 49.33 kg/m   Physical Exam  NAD, alert Abd:  Right lower quadrant drain in place with purulent output.  Insertion site c/d/i. Peri-epigastric drain in place with blood output.  Insertion site c/d/i.  Abdomen diffusely tender.   Imaging: Dg Chest Left Decubitus  Result Date: 01/22/2017 CLINICAL DATA:  Shortness of breath.  Known left pleural effusion. EXAM: CHEST - LEFT DECUBITUS COMPARISON:  Portable chest x-ray of today's date. FINDINGS: There is a moderate size left pleural effusion. There is a free-flowing component of this E fusion. In addition, there is a small right pleural effusion which layers medially. IMPRESSION: Moderate-sized left pleural effusion with a free-flowing component. Small right pleural effusion with free-flowing component. Electronically Signed   By: David  SwazilandJordan M.D.   On: 01/22/2017 11:19   Ct Abdomen Pelvis W Contrast  Result Date: 01/19/2017  CLINICAL DATA:  Follow-up intestinal bypass and anastomosis status EXAM: CT ABDOMEN AND PELVIS WITH CONTRAST TECHNIQUE: Multidetector CT imaging of the abdomen and pelvis was performed using the standard protocol following bolus administration of intravenous contrast. CONTRAST:  100 mL Isovue-300 intravenous COMPARISON:  01/16/2017, 01/15/2017 FINDINGS: Lower chest: Moderate left-sided pleural effusion and small right pleural effusion, slightly increased compared to  prior. Bilateral lower lobe consolidations may reflect atelectasis or pneumonia. Heart size is within normal limits. Hepatobiliary: Small hypodense collection along the left margin of the liver. Decreased rim enhancing sub capsular fluid around the liver. Residual lenticular shaped collection measuring 16 cm in AP by 3.3 cm transverse. No calcified gallstones. No biliary dilatation. Pancreas: Unremarkable. No pancreatic ductal dilatation or surrounding inflammatory changes. Spleen: Normal in size without focal abnormality. Decreased rim enhancing left perisplenic fluid collection, measuring 11.7 cm by 2.2 cm compared with 12.1 x 2.4 cm previously. Adrenals/Urinary Tract: Adrenal glands are within normal limits. 3 mm stone in the upper pole of the left kidney. No hydronephrosis. The bladder is unremarkable small cyst lower pole left kidney. Stomach/Bowel: Patient is status post gastric bypass. Slight increased size of a gas and fluid collection, adjacent to the suture line, this measures 10.9 x 5.8 cm compared with 10.7 x 5 cm previously. It is now contiguous with a small gas and fluid collection between the gastric pouch and anterior aspect of the liver. Additional hypodense fluid collection measuring about 4.7 by 2.4 cm adjacent to the pyloric region of the excluded stomach. Previously this measured 2.5 x 4.3 cm. There is no evidence for a bowel obstruction. There is mild wall thickening of the hepatic flexure and right colon. Vascular/Lymphatic: Aortic atherosclerosis. No enlarged abdominal or pelvic lymph nodes. Reproductive: Prostate is unremarkable. Other: Increased fluid in the pelvis/presacral space. Interval placement of a right lower percutaneous drainage catheter with markedly decreased in size of the previously noted large peritoneal fluid collection. No discrete residual measurable collection at this time. Small scattered rim enhancing fluid collections are visualized in the right gutter, inferior margin  of the liver, and in the left upper quadrant of the abdomen. Musculoskeletal: Degenerative changes. No acute or suspicious findings. IMPRESSION: 1. Interim placement of right lower quadrant percutaneous drainage catheter with significant decrease in size of previously noted large intraperitoneal fluid collection. Interval decrease in size of large right perihepatic and perisplenic rim enhancing fluid collections with residuals as described above. 2. Slight interval increase in size of a gas and fluid containing collection adjacent to the gastric sutures/gastro jejunal anastomosis. Gas and fluid collection now extend slightly more anterior, between the gastric pouch and the liver. Given that the other collections have decreased in size, findings would be concerning for dehiscence/anastomotic leak. 3. Additional scattered small rim enhancing fluid collections within the abdomen and pelvis. 4. Mild wall thickening of the ascending colon and hepatic flexure suggesting a colitis or reactive inflammation. Residual hazy edema and inflammation of the central and right upper quadrant mesentery. 5. Slight increased pleural effusions with continued bilateral lower lobe atelectasis or pneumonia 6. Nonobstructing stone in the left kidney Electronically Signed   By: Jasmine Pang M.D.   On: 01/19/2017 21:19   Dg Chest Port 1 View  Result Date: 01/21/2017 CLINICAL DATA:  Pleural effusion.  PICC line placement. EXAM: PORTABLE CHEST 1 VIEW COMPARISON:  Two-view chest x-ray 01/15/2017 FINDINGS: A right-sided PICC line is in place.  The tip is in the mid SVC. A left pleural effusion has increased. A small  right pleural effusion is again noted. Bibasilar airspace disease likely reflects atelectasis. Low lung volumes exaggerate the heart size. Mild pulmonary vascular congestion has increased. IMPRESSION: 1. Increasing left pleural effusion. 2. Cardiomegaly and progressive pulmonary vascular congestion. 3. Interval placement of  right-sided PICC line. The tip is in the mid SVC. 4. Bibasilar airspace disease likely reflects atelectasis. Electronically Signed   By: Marin Roberts M.D.   On: 01/21/2017 16:48   Ct Image Guided Drainage By Percutaneous Catheter  Result Date: 01/21/2017 INDICATION: History of reactive surgery performed at outside institution, post placement of CT-guided percutaneous drainage catheter within the right lower abdomen / pelvis on 01/16/2017 with subsequent abdominal CT performed 01/19/2017 demonstrating a residual perigastric abscess. Request made for placement of a additional percutaneous drainage catheter for infection source control purposes. EXAM: CT IMAGE GUIDED DRAINAGE BY PERCUTANEOUS CATHETER COMPARISON:  CT abdomen pelvis - 01/19/2017; 01/15/2017; CT-guided percutaneous drainage catheter placement - 01/16/2017 MEDICATIONS: The patient is currently admitted to the hospital and receiving intravenous antibiotics. The antibiotics were administered within an appropriate time frame prior to the initiation of the procedure. ANESTHESIA/SEDATION: Moderate (conscious) sedation was employed during this procedure. A total of Versed 2 mg and Fentanyl 100 mcg was administered intravenously. Moderate Sedation Time: 15 minutes. The patient's level of consciousness and vital signs were monitored continuously by radiology nursing throughout the procedure under my direct supervision. CONTRAST:  None COMPLICATIONS: None immediate. PROCEDURE: Informed written consent was obtained from the patient after a discussion of the risks, benefits and alternatives to treatment. The patient was placed supine on the CT gantry and a pre procedural CT was performed re-demonstrating the known abscess/fluid collection within the upper abdomen adjacent to the stomach and gastric anastomosis with dominant component measuring approximately 3.7 x 10.6 cm (image 24, series 3). The procedure was planned. A timeout was performed prior to the  initiation of the procedure. The skin overlying the midline of the upper abdomen was prepped and draped in the usual sterile fashion. The overlying soft tissues were anesthetized with 1% lidocaine with epinephrine. Appropriate trajectory was planned with the use of a 22 gauge spinal needle. An 18 gauge trocar needle was advanced into the abscess/fluid collection and a short Amplatz super stiff wire was coiled within the collection. Appropriate positioning was confirmed with a limited CT scan. The tract was serially dilated allowing placement of a 10 [place] all-purpose drainage catheter. Appropriate positioning was confirmed with a limited postprocedural CT scan. Approximately 100 cc of purulent fluid was aspirated. The tube was connected to a drainage bag and sutured in place. A dressing was placed. The patient tolerated the procedure well without immediate post procedural complication. IMPRESSION: Successful CT guided placement of a 10 French all purpose drain catheter into the residual perigastric abscess with aspiration of 100 cc of purulent fluid. Samples were sent to the laboratory as requested by the ordering clinical team. Electronically Signed   By: Simonne Come M.D.   On: 01/21/2017 10:11    Labs:  CBC:  Recent Labs  01/17/17 0407 01/19/17 0516 01/21/17 0455 01/22/17 0553  WBC 5.8 6.2 6.0 5.3  HGB 8.9* 9.5* 8.9* 8.9*  HCT 29.0* 32.0* 30.1* 29.3*  PLT 478* 362 332 352    COAGS:  Recent Labs  01/15/17 1420 01/20/17 1744  INR 1.07 1.19    BMP:  Recent Labs  01/19/17 0516 01/20/17 1745 01/21/17 0455 01/22/17 0553  NA 135 125* 133* 134*  K 3.8 6.1* 4.1 4.1  CL 99*  93* 97* 98*  CO2 30 21* 28 29  GLUCOSE 163* 795* 164* 161*  BUN 6 7 5* 5*  CALCIUM 7.9* 7.8* 7.9* 8.1*  CREATININE 0.53* 0.59* 0.53* 0.53*  GFRNONAA >60 >60 >60 >60  GFRAA >60 >60 >60 >60    LIVER FUNCTION TESTS:  Recent Labs  01/15/17 1420 01/17/17 0407 01/19/17 0516 01/22/17 0553  BILITOT 1.1 0.7  0.3 0.6  AST ALT 17 13* 11* 18  ALKPHOS 240* 168* 116 139*  PROT 7.0 5.8* 5.7* 5.7*  ALBUMIN 2.2* 1.7* 1.6* 1.6*    Assessment and Plan: Abdominal abscess s/p RLQ drain placement by Dr. Deanne Coffer 01/16/17, peri-epigastric drain placement 9/5 by Dr. Grace Isaac.  RLQ drain continues with tan, purulent output. Cultures positive for Garnerella, streptococcus constellatus. Peri-epigastric drain in place with bloody output.  Cultures positive for GPC in pairs, GNRs.  On TPN. Continue drain care.  Plans per surgery.  IR to follow.  Electronically Signed: Hoyt Koch, PA 01/22/2017, 3:52 PM   I spent a total of 15 Minutes at the the patient's bedside AND on the patient's hospital floor or unit, greater than 50% of which was counseling/coordinating care for abdominal abscess

## 2017-01-22 NOTE — Progress Notes (Signed)
[place] NOTE   Pharmacy Consult for TPN Indication: Prolonged NPO  Patient Measurements: Height: _0  (177.8 cm) Weight: (!) 343 lb 12.8 oz (155.9 kg) IBW/kg (Calculated) : 73 TPN AdjBW (KG): 93.4 Body mass index is 49.33 kg/m.  Assessment:  41 year old obese male s/p laparoscopic Roux-en-Y/gastric bypass surgery on 12/08/16 and hospitalization 8/14 through 01/10/17 for anasarca, abdominal pain, fever, and postoperative seroma with percutaneous drain placement now re-admitted with continued abdominal pain, occasional nausea, non-bloddy emesis, weight loss (on Lasix), and decreased appetite. CT of abdomen revealed multiple large gas containing intra-abdominal abscesses and concern for dehiscence.   GI: Multiple intra-abdominal abscesses and peritonitis, Anasarca/vol overload with perc drain placed 8/31 (>2L of purulent fluid). Plan 2nd drain posterior to leaking anastomosis. Albumin 1.6. Pre-albumin stable 5.3. Last BM 9/5. Pepcid in TPN. Zofran. Hemorrhoids on Anusol-hc Endo: DM2 (metformin pta), CBGs 136-164 (795 was a lab error) Insulin requirements since new TPN bag at 1800: 18 units of resistant SSI + 20 units insulin added to TPN bag Lytes: CoCa 10 (Ca x Phos = 53) Other lytes wnl Renal: SCr 0.53 stable. UOP 0.4 mL/kg/hr per documentation. IVF - NS 35 mL/hr Pulm: [place] (CPAP at home [facility]) Cards: HLD, BP improved. ST- 90s Hepatobil:  LFTs/Tbili and TG all wnl, Alk Phos still elevated 139 Neuro: Pain score 7 - IV Dilaudid PRN, robaxin prn ID: Zosyn for peritonitis - day #8 - rare gardnerella/strep constellatus in abdominal abscess cx. WBC WNL. Afebrile. LA normal  Best Practices: Lovenox 40 BID TPN Access: Double lumen PICC 01/16/17 TPN start date: 01/16/17  Nutritional Goals (per RD recommendation 8/31): KCal: 5784-6962 Protein: 115-130 g   Current Nutrition:  NPO, TPN  Plan:  -Continue Clinimix E 5/15 at 113m/hr  -Continue 20% lipid  emulsion at 258mhr x 12 hrs This provides 120g of protein and 2184 kCals per day meeting ~100% of protein and 95% of kcal needs. -Add MVI and trace elements in TPN -Add Pepcid 4012mo TPN bag -Increase insulin in TPN bag to 25 units -Continue Resistant SSI q4h and monitor CBGs closely -Watch Ca*Phos product -F/u duration of Zosyn    AliHughes BetterharmD, BCPS Clinical Pharmacist 01/22/2017 9:36 AM

## 2017-01-22 NOTE — Progress Notes (Signed)
PROGRESS NOTE    Albert White  NWG:[id number] DOB: [birth year] DOA: 01/15/2017 PCP: Alysia Penna, MD   Brief Narrative:  41 year old male who presented with abdominal pain. Is known to have a recent Roux-en-Y/gastric bypass on 12/08/16 in [place] by Dr. Franki Cabot, rehospitalized 12/30/16-01/10/17 for anasarca, abdominal pain, fever of 103F and postoperative seroma for which a percutaneous drain was placed. Also has a diagnosis of type 2 diabetes mellitus, dyslipidemia and obstructive sleep apnea. After his discharge patient complained of persistent abdominal pain, associated with nausea, emesis and decreased appetite, outpatient CT of the abdomen showedgas containing 10.7 x 5 cm abscess abutting the posterior margin of the GJ anastomosis worrisome for dehiscence, diffuse peritonitis with large gas containing abscess throughout the peritoneal cavity including a right perihepatic abscess compressing the right liver lobe and provoking anileus, findings that prompted rehospitalization.   Patient was admitted to the hospital with the working diagnosis of abdominal sepsis due to peritonitis, and intra-abdominal abscess related to a postoperative complication of Roux-en-Y/gastric bypass (12/08/16).   Assessment & Plan:   Active Problems:   Postprocedural intraabdominal abscess   1. Sepsis, present on admission due to peritonitis and intra-abdominal abscess.  Antibiotic therapy with IV Zosyn (8/30 - ).  Will start vanc until gram positive cocci is further speciated (9/6 - ) Percutaneous drain in place (8/31) with purulent drainage in RLQ (some sediment visualized today).  215 cc in past 24 hrs.   Epigastric drain placed (9/5) with serosanguinous drainage.  8/31 cx with rare gardenella vaginalis and rare strep constellatus 9/5 cx with gram positive cocci in pairs as well as gram variable rods Bcx 8/30 NGTDx5days Follow up ct with decreased in size of intra-abdominal collections, but  positive findings cw dehiscence/anastomotic leak, at the gastro jejunal anastomosis. S/p IR drainage today with additional drain placed.  Repeat cx pending  2. Thickening of Ascending colon/hepatic flexure concerning for colitis vs reactive inflammation:  Currently on zosyn, may be related to peritonitis/intrabdominal abscess above.  Had diarrhea yesterday, but resolved today.  CTM.    3. Pleural effusions with lower lobe atelectasis vs pneumonia:  f/u CXR  CXR with increasing L effusion and pulm vascular congestion, will f/u left lateral decub (consider thora)  Also will f/u proBNP  2. Type 2 diabetes mellitus. Glucose cover and monitoring with sliding scale insulin, continue parenteral nutrition (receiving insulin with TPN as well).   3. Obstructive sleep apnea. Cpap,  oxymetry monitoring and as needed supplemental 02 per [place], to keep 02 saturation above 92%, continue incentive spirometer.   4. Chronic multifactorial anemia. No signs of active bleeding, will check cell counts in am.  5. Morbid obesity. SP bariatric surgery, high risk for inpatient complications  6. TPN per pharmacy  DVT prophylaxis: lovenox Code Status: full  Family Communication: wife on phone Disposition Plan: pending further improvement   Consultants:   Surgery  IR  pharmacy  Procedures: (Don't include imaging studies which can be auto populated. Include things that cannot be auto populated i.e. Echo, Carotid and venous dopplers, Foley, Bipap, HD, tubes/drains, wound vac, central lines etc)  Perc drain placement  Antimicrobials: (specify start and planned stop date. Auto populated tables are space occupying and do not give end dates)  zosyn    Subjective: Feels stable.  No fevers or other concerns today.  He and wife with many questions about plans and whats next.   Objective: Vitals:   01/21/17 2059 01/22/17 0133 01/22/17 0500 01/22/17 0900  BP: (!) 145/68 (!) 144/68 (!) 144/67 123/80    Pulse: 94 88 94 98  Resp: Temp: 98.2 F (36.8 C) 98.4 F (36.9 C) 98.3 F (36.8 C) 97.9 F (36.6 C)  TempSrc: Oral Oral Oral Oral  SpO2: 98% 98% 97% 97%  Weight:   (!) 155.9 kg (343 lb 12.8 oz)   Height:        Intake/Output Summary (Last 24 hours) at 01/22/17 1028 Last data filed at 01/22/17 0900  Gross per 24 hour  Intake           2640.5 ml  Output             3640 ml  Net           -999.5 ml   Filed Weights   01/20/17 0454 01/21/17 0502 01/22/17 0500  Weight: (!) 149.7 kg (330 lb 1.6 oz) (!) 150.5 kg (331 lb 12.8 oz) (!) 155.9 kg (343 lb 12.8 oz)    Examination:  General: No acute distress.  Uncomfortable Cardiovascular: Heart sounds show a regular rate, and rhythm. No gallops or rubs. No murmurs. No JVD. Lungs: Clear to auscultation bilaterally with good air movement. No rales, rhonchi or wheezes.  Slightly decreased lung sounds in L lower lung fields.  Abdomen: Protuberant. Soft, diffusely tender, nondistended with normal active bowel sounds. No masses. No hepatosplenomegaly. Neurological: Alert and oriented 3. Moves all extremities 4 with equal strength. Cranial nerves II through XII grossly intact. Skin: Warm and dry. No rashes or lesions. Extremities: No clubbing or cyanosis. 1+ LEE. Pedal pulses 2+. Psychiatric: Mood and affect are normal. Insight and judgment are appropriate.   Data Reviewed: I have personally reviewed following labs and imaging studies  CBC:  Recent Labs Lab 01/15/17 1420 01/16/17 0411 01/17/17 0407 01/19/17 0516 01/21/17 0455 01/22/17 0553  WBC 8.6 6.7 5.8 6.2 6.0 5.3  NEUTROABS 6.3  --  4.1 4.3  --   --   HGB 10.3* 9.5* 8.9* 9.5* 8.9* 8.9*  HCT 33.1* 30.4* 29.0* 32.0* 30.1* 29.3*  MCV 86.2 86.6 86.3 86.5 86.2 85.4  PLT 578* 514* 478* 362 332 352   Basic Metabolic Panel:  Recent Labs Lab 01/18/17 0520 01/19/17 0516 01/20/17 1745 01/21/17 0455 01/22/17 0553  NA 136 135 125* 133* 134*  K 3.7 3.8 6.1* 4.1  4.1  CL 99* 99* 93* 97* 98*  CO2 31 30 21* 28 29  GLUCOSE 154* 163* 795* 164* 161*  BUN 5* 6 7 5* 5*  CREATININE 0.54* 0.53* 0.59* 0.53* 0.53*  CALCIUM 7.8* 7.9* 7.8* 7.9* 8.1*  MG 1.8 1.7 2.0 1.8 1.7  PHOS 4.1 4.6 6.9* 5.0* 5.3*   GFR: Estimated Creatinine Clearance: 184.4 mL/min (A) (by C-G formula based on SCr of 0.53 mg/dL (L)). Liver Function Tests:  Recent Labs Lab 01/15/17 1420 01/17/17 0407 01/19/17 0516 01/22/17 0553  AST ALT 17 13* 11* 18  ALKPHOS 240* 168* 116 139*  BILITOT 1.1 0.7 0.3 0.6  PROT 7.0 5.8* 5.7* 5.7*  ALBUMIN 2.2* 1.7* 1.6* 1.6*   No results for input(s): LIPASE, AMYLASE in the last 168 hours. No results for input(s): AMMONIA in the last 168 hours. Coagulation Profile:  Recent Labs Lab 01/15/17 1420 01/20/17 1744  INR 1.07 1.19   Cardiac Enzymes: No results for input(s): CKTOTAL, CKMB, CKMBINDEX, TROPONINI in the last 168 hours. BNP (last 3 results) No results for input(s): PROBNP in the last 8760 hours.  HbA1C: No results for input(s): HGBA1C in the last 72 hours. CBG:  Recent Labs Lab 01/21/17 1623 01/21/17 1954 01/21/17 2330 01/22/17 0358 01/22/17 0745  GLUCAP 133* 134* 154* 156* 151*   Lipid Profile: No results for input(s): CHOL, HDL, LDLCALC, TRIG, CHOLHDL, LDLDIRECT in the last 72 hours. Thyroid Function Tests: No results for input(s): TSH, T4TOTAL, FREET4, T3FREE, THYROIDAB in the last 72 hours. Anemia Panel:  Recent Labs  01/21/17 2025  VITAMINB12 809  FOLATE 7.5  FERRITIN 315  TIBC 119*  IRON 15*   Sepsis Labs:  Recent Labs Lab 01/15/17 1445 01/15/17 1726  LATICACIDVEN 1.65 1.19    Recent Results (from the past 240 hour(s))  Culture, blood (Routine x 2)     Status: None   Collection Time: 01/15/17  2:20 PM  Result Value Ref Range Status   Specimen Description BLOOD LEFT HAND  Final   Special Requests   Final    BOTTLES DRAWN AEROBIC AND ANAEROBIC Blood Culture adequate volume   Culture  NO GROWTH 5 DAYS  Final   Report Status 01/20/2017 FINAL  Final  Culture, blood (Routine x 2)     Status: None   Collection Time: 01/15/17  3:25 PM  Result Value Ref Range Status   Specimen Description BLOOD RIGHT HAND  Final   Special Requests   Final    BOTTLES DRAWN AEROBIC AND ANAEROBIC Blood Culture adequate volume   Culture NO GROWTH 5 DAYS  Final   Report Status 01/20/2017 FINAL  Final  MRSA PCR Screening     Status: None   Collection Time: 01/15/17 11:04 PM  Result Value Ref Range Status   MRSA by PCR NEGATIVE NEGATIVE Final    Comment:        The GeneXpert MRSA Assay (FDA approved for NASAL specimens only), is one component of a comprehensive MRSA colonization surveillance program. It is not intended to diagnose MRSA infection nor to guide or monitor treatment for MRSA infections.   Aerobic/Anaerobic Culture (surgical/deep wound)     Status: None   Collection Time: 01/16/17  2:21 PM  Result Value Ref Range Status   Specimen Description ABSCESS ABDOMEN  Final   Special Requests INTRA ABDOMINAL ABSCESS  Final   Gram Stain   Final    ABUNDANT WBC PRESENT, PREDOMINANTLY PMN NO ORGANISMS SEEN    Culture   Final    RARE GARDNERELLA VAGINALIS Standardized susceptibility testing for this organism is not available. RARE STREPTOCOCCUS CONSTELLATUS CALL MICROBIOLOGY LAB IF SENSITIVITIES ARE REQUIRED.    Report Status 01/20/2017 FINAL  Final  Aerobic/Anaerobic Culture (surgical/deep wound)     Status: None (Preliminary result)   Collection Time: 01/21/17 10:04 AM  Result Value Ref Range Status   Specimen Description ABSCESS  Final   Special Requests DRAIN POST CT GUIDED PLACEMENT  Final   Gram Stain   Final    ABUNDANT WBC PRESENT,BOTH PMN AND MONONUCLEAR ABUNDANT GRAM POSITIVE COCCI IN PAIRS ABUNDANT GRAM VARIABLE ROD    Culture PENDING  Incomplete   Report Status PENDING  Incomplete         Radiology Studies: Dg Chest Port 1 View  Result Date:  01/21/2017 CLINICAL DATA:  Pleural effusion.  PICC line placement. EXAM: PORTABLE CHEST 1 VIEW COMPARISON:  Two-view chest x-ray 01/15/2017 FINDINGS: A right-sided PICC line is in place.  The tip is in the mid SVC. A left pleural effusion has increased. A small right pleural effusion is again noted. Bibasilar airspace disease likely  reflects atelectasis. Low lung volumes exaggerate the heart size. Mild pulmonary vascular congestion has increased. IMPRESSION: 1. Increasing left pleural effusion. 2. Cardiomegaly and progressive pulmonary vascular congestion. 3. Interval placement of right-sided PICC line. The tip is in the mid SVC. 4. Bibasilar airspace disease likely reflects atelectasis. Electronically Signed   By: Marin Roberts M.D.   On: 01/21/2017 16:48   Ct Image Guided Drainage By Percutaneous Catheter  Result Date: 01/21/2017 INDICATION: History of reactive surgery performed at outside institution, post placement of CT-guided percutaneous drainage catheter within the right lower abdomen / pelvis on 01/16/2017 with subsequent abdominal CT performed 01/19/2017 demonstrating a residual perigastric abscess. Request made for placement of a additional percutaneous drainage catheter for infection source control purposes. EXAM: CT IMAGE GUIDED DRAINAGE BY PERCUTANEOUS CATHETER COMPARISON:  CT abdomen pelvis - 01/19/2017; 01/15/2017; CT-guided percutaneous drainage catheter placement - 01/16/2017 MEDICATIONS: The patient is currently admitted to the hospital and receiving intravenous antibiotics. The antibiotics were administered within an appropriate time frame prior to the initiation of the procedure. ANESTHESIA/SEDATION: Moderate (conscious) sedation was employed during this procedure. A total of Versed 2 mg and Fentanyl 100 mcg was administered intravenously. Moderate Sedation Time: 15 minutes. The patient's level of consciousness and vital signs were monitored continuously by radiology nursing throughout  the procedure under my direct supervision. CONTRAST:  None COMPLICATIONS: None immediate. PROCEDURE: Informed written consent was obtained from the patient after a discussion of the risks, benefits and alternatives to treatment. The patient was placed supine on the CT gantry and a pre procedural CT was performed re-demonstrating the known abscess/fluid collection within the upper abdomen adjacent to the stomach and gastric anastomosis with dominant component measuring approximately 3.7 x 10.6 cm (image 24, series 3). The procedure was planned. A timeout was performed prior to the initiation of the procedure. The skin overlying the midline of the upper abdomen was prepped and draped in the usual sterile fashion. The overlying soft tissues were anesthetized with 1% lidocaine with epinephrine. Appropriate trajectory was planned with the use of a 22 gauge spinal needle. An 18 gauge trocar needle was advanced into the abscess/fluid collection and a short Amplatz super stiff wire was coiled within the collection. Appropriate positioning was confirmed with a limited CT scan. The tract was serially dilated allowing placement of a 10 [place] all-purpose drainage catheter. Appropriate positioning was confirmed with a limited postprocedural CT scan. Approximately 100 cc of purulent fluid was aspirated. The tube was connected to a drainage bag and sutured in place. A dressing was placed. The patient tolerated the procedure well without immediate post procedural complication. IMPRESSION: Successful CT guided placement of a 10 French all purpose drain catheter into the residual perigastric abscess with aspiration of 100 cc of purulent fluid. Samples were sent to the laboratory as requested by the ordering clinical team. Electronically Signed   By: Simonne Come M.D.   On: 01/21/2017 10:11        Scheduled Meds: . chlorhexidine  15 mL Mouth Rinse BID  . Chlorhexidine Gluconate Cloth  6 each Topical Daily  . enoxaparin  (LOVENOX) injection  40 mg Subcutaneous Q12H  . hydrocortisone   Rectal BID  . insulin aspart  0-20 Units Subcutaneous Q4H  . mouth rinse  15 mL Mouth Rinse q12n4p  . pneumococcal 23 valent vaccine  0.5 mL Intramuscular Tomorrow-1000  . sodium chloride flush  5 mL Intravenous Q8H  . sodium chloride flush  5 mL Intravenous Q8H   Continuous Infusions: .  sodium chloride 35 mL/hr at 01/22/17 0558  . Marland KitchenTPN (CLINIMIX-E) Adult     And  . fat emulsion    . methocarbamol (ROBAXIN)  IV Stopped (01/22/17 4098)  . piperacillin-tazobactam (ZOSYN)  IV 3.375 g (01/22/17 0544)  . Marland KitchenTPN (CLINIMIX-E) Adult 100 mL/hr at 01/22/17 0558     LOS: 7 days    Time spent: 35 min    A Grier Mitts, MD [facility] [phone]   If 7PM-7AM, please contact night-coverage [email or web address] Password [email or web address] 01/22/2017, 10:28 AM

## 2017-01-22 NOTE — Progress Notes (Signed)
Progress Note: General Surgery Service   Assessment/Plan: Patient Active Problem List   Diagnosis Date Noted  . Postprocedural intraabdominal abscess 01/15/2017  . DM (diabetes mellitus) (HCC) 02/29/2016  . HLD (hyperlipidemia) 02/29/2016  . Cellulitis and abscess of neck 02/29/2016  . Cellulitis 12/14/2013  . Morbid obesity (HCC) 12/14/2013  . Hidradenitis suppurativa, neck and scalp 08/09/2012   S/p lap RnY gastric bypass for morbid obesity complicated with presumed GJ anastomotic leak s/p  2 perc drain placements -continue drains -continue TPN -encourage ambulation -continue IV abx -after discussing case with all the bariatric surgeons, we think waiting longer perform attempting surgical g tube placement and surgical assessment of the leak.   LOS: 7 days  Chief Complaint/Subjective: Continued pain in left upper quadrant pain, getting out of bed some, no nausea  Objective: Vital signs in last 24 hours: Temp:  [97.9 F (36.6 C)-98.4 F (36.9 C)] 97.9 F (36.6 C) (09/06 0900) Pulse Rate:  [88-98] 98 (09/06 0900) Resp:  [16-18] 16 (09/06 0900) BP: (123-145)/(67-80) 123/80 (09/06 0900) SpO2:  [97 %-98 %] 97 % (09/06 0900) Weight:  [155.9 kg (343 lb 12.8 oz)] 155.9 kg (343 lb 12.8 oz) (09/06 0500) Last BM Date: 01/21/17  Intake/Output from previous day: 09/05 0701 - 09/06 0700 In: 2640.5 [I.V.:2170.5; IV Piggyback:440] Out: 2940 [Urine:2470; Drains:470] Intake/Output this shift: Total I/O In: -  Out: 800 [Urine:800]  Lungs: CTAB  Cardiovascular: RRR  Abd: soft, diffuse tenderness, lower drain with thinner yellow fluid, upper drain with minimal serosanguinous fluid  Extremities: no edema  Neuro: AOx4  Lab Results: CBC   Recent Labs  01/21/17 0455 01/22/17 0553  WBC 6.0 5.3  HGB 8.9* 8.9*  HCT 30.1* 29.3*  PLT 332 352   BMET  Recent Labs  01/21/17 0455 01/22/17 0553  NA 133* 134*  K 4.1 4.1  CL 97* 98*  CO2 28 29  GLUCOSE 164* 161*  BUN 5*  5*  CREATININE 0.53* 0.53*  CALCIUM 7.9* 8.1*   PT/INR  Recent Labs  01/20/17 1744  LABPROT 15.0  INR 1.19   ABG No results for input(s): PHART, HCO3 in the last 72 hours.  Invalid input(s): PCO2, PO2  Studies/Results:  Anti-infectives: Anti-infectives    Start     Dose/Rate Route Frequency Ordered Stop   01/15/17 1815  piperacillin-tazobactam (ZOSYN) IVPB 3.375 g  Status:  Discontinued     3.375 g 12.5 mL/hr over 240 Minutes Intravenous Every 8 hours 01/15/17 1804 01/15/17 1804   01/15/17 1630  piperacillin-tazobactam (ZOSYN) IVPB 3.375 g     3.375 g 12.5 mL/hr over 240 Minutes Intravenous Every 8 hours 01/15/17 1616        Medications: Scheduled Meds: . chlorhexidine  15 mL Mouth Rinse BID  . Chlorhexidine Gluconate Cloth  6 each Topical Daily  . enoxaparin (LOVENOX) injection  40 mg Subcutaneous Q12H  . hydrocortisone   Rectal BID  . insulin aspart  0-20 Units Subcutaneous Q4H  . mouth rinse  15 mL Mouth Rinse q12n4p  . pneumococcal 23 valent vaccine  0.5 mL Intramuscular Tomorrow-1000  . sodium chloride flush  5 mL Intravenous Q8H  . sodium chloride flush  5 mL Intravenous Q8H   Continuous Infusions: . sodium chloride 35 mL/hr at 01/22/17 0558  . Marland Kitchen.TPN (CLINIMIX-E) Adult     And  . fat emulsion    . methocarbamol (ROBAXIN)  IV Stopped (01/22/17 [id number])  . piperacillin-tazobactam (ZOSYN)  IV 3.375 g (01/22/17 0544)  . Marland Kitchen.TPN (CLINIMIX-E) Adult  100 mL/hr at 01/22/17 0558   PRN Meds:.fentaNYL, HYDROcodone-acetaminophen, HYDROmorphone (DILAUDID) injection, levalbuterol, midazolam, ondansetron **OR** ondansetron (ZOFRAN) IV  Rodman Pickle, MD Pg# [phone] [facility]

## 2017-01-23 ENCOUNTER — Inpatient Hospital Stay (HOSPITAL_COMMUNITY): Payer: 59

## 2017-01-23 ENCOUNTER — Encounter (HOSPITAL_COMMUNITY): Payer: Self-pay | Admitting: General Surgery

## 2017-01-23 HISTORY — PX: IR THORACENTESIS ASP PLEURAL SPACE W/IMG GUIDE: IMG5380

## 2017-01-23 LAB — ALBUMIN, PLEURAL OR PERITONEAL FLUID: Albumin, Fluid: 1.4 g/dL

## 2017-01-23 LAB — LACTATE DEHYDROGENASE, PLEURAL OR PERITONEAL FLUID: LD, Fluid: 453 U/L — ABNORMAL HIGH (ref 3–23)

## 2017-01-23 LAB — CBC
HCT: 31.2 % — ABNORMAL LOW (ref 39.0–52.0)
HEMOGLOBIN: 9.4 g/dL — AB (ref 13.0–17.0)
MCH: 25.7 pg — ABNORMAL LOW (ref 26.0–34.0)
MCHC: 30.1 g/dL (ref 30.0–36.0)
MCV: 85.2 fL (ref 78.0–100.0)
PLATELETS: 401 10*3/uL — AB (ref 150–400)
RBC: 3.66 MIL/uL — AB (ref 4.22–5.81)
RDW: 15.4 % (ref 11.5–15.5)
WBC: 6.9 10*3/uL (ref 4.0–10.5)

## 2017-01-23 LAB — GLUCOSE, CAPILLARY
GLUCOSE-CAPILLARY: 145 mg/dL — AB (ref 65–99)
GLUCOSE-CAPILLARY: 169 mg/dL — AB (ref 65–99)
GLUCOSE-CAPILLARY: 125 mg/dL — AB (ref 65–99)
GLUCOSE-CAPILLARY: 133 mg/dL — AB (ref 65–99)
Glucose-Capillary: 143 mg/dL — ABNORMAL HIGH (ref 65–99)
Glucose-Capillary: 145 mg/dL — ABNORMAL HIGH (ref 65–99)

## 2017-01-23 LAB — COMPREHENSIVE METABOLIC PANEL
ALBUMIN: 1.9 g/dL — AB (ref 3.5–5.0)
ALT: 19 U/L (ref 17–63)
ANION GAP: 10 (ref 5–15)
AST: 27 U/L (ref 15–41)
Alkaline Phosphatase: 175 U/L — ABNORMAL HIGH (ref 38–126)
BUN: 8 mg/dL (ref 6–20)
CALCIUM: 8.6 mg/dL — AB (ref 8.9–10.3)
CO2: 28 mmol/L (ref 22–32)
Chloride: 97 mmol/L — ABNORMAL LOW (ref 101–111)
Creatinine, Ser: 0.6 mg/dL — ABNORMAL LOW (ref 0.61–1.24)
GFR calc Af Amer: >60 mL/min (ref >60)
GFR calc non Af Amer: >60 mL/min (ref >60)
GLUCOSE: 199 mg/dL — AB (ref 65–99)
POTASSIUM: 4.2 mmol/L (ref 3.5–5.1)
Sodium: 135 mmol/L (ref 135–145)
TOTAL PROTEIN: 6.7 g/dL (ref 6.5–8.1)
Total Bilirubin: 0.8 mg/dL (ref 0.3–1.2)

## 2017-01-23 LAB — BODY FLUID CELL COUNT WITH DIFFERENTIAL
EOS FL: 0 %
Lymphs, Fluid: 43 %
Monocyte-Macrophage-Serous Fluid: 3 % — ABNORMAL LOW (ref 50–90)
Neutrophil Count, Fluid: 54 % — ABNORMAL HIGH (ref 0–25)
WBC FLUID: 3750 uL — AB (ref 0–1000)

## 2017-01-23 LAB — PROTEIN, PLEURAL OR PERITONEAL FLUID: Total protein, fluid: 3.8 g/dL

## 2017-01-23 LAB — GRAM STAIN: Gram Stain: NONE SEEN

## 2017-01-23 LAB — GLUCOSE, PLEURAL OR PERITONEAL FLUID: GLUCOSE FL: 130 mg/dL

## 2017-01-23 LAB — PROTIME-INR
INR: 1.12
PROTHROMBIN TIME: 14.3 s (ref 11.4–15.2)

## 2017-01-23 LAB — AMYLASE, PLEURAL OR PERITONEAL FLUID: Amylase, Fluid: 17 U/L

## 2017-01-23 LAB — LACTATE DEHYDROGENASE: LDH: 152 U/L (ref 98–192)

## 2017-01-23 MED ORDER — FAT EMULSION 20 % IV EMUL
240.0000 mL | INTRAVENOUS | Status: DC
  Filled 2017-01-23: qty 250

## 2017-01-23 MED ORDER — HYDROMORPHONE HCL 1 MG/ML IJ SOLN
INTRAMUSCULAR | Status: AC
  Filled 2017-01-23: qty 1.5

## 2017-01-23 MED ORDER — FAT EMULSION 20 % IV EMUL
240.0000 mL | INTRAVENOUS | Status: AC
  Administered 2017-01-23: 240 mL via INTRAVENOUS
  Filled 2017-01-23: qty 250

## 2017-01-23 MED ORDER — MAGNESIUM SULFATE 2 GM/50ML IV SOLN
2.0000 g | Freq: Once | INTRAVENOUS | Status: AC
  Administered 2017-01-23: 2 g via INTRAVENOUS
  Filled 2017-01-23: qty 50

## 2017-01-23 MED ORDER — LIDOCAINE HCL 1 % IJ SOLN
INTRAMUSCULAR | Status: AC | PRN
  Administered 2017-01-23: 10 mL

## 2017-01-23 MED ORDER — IOPAMIDOL (ISOVUE-300) INJECTION 61%
150.0000 mL | Freq: Once | INTRAVENOUS | Status: AC | PRN
  Administered 2017-01-23: 75 mL via INTRAVENOUS

## 2017-01-23 MED ORDER — TRACE MINERALS CR-CU-MN-SE-ZN 10-1000-500-60 MCG/ML IV SOLN
INTRAVENOUS | Status: DC
  Filled 2017-01-23: qty 2520

## 2017-01-23 MED ORDER — POTASSIUM CHLORIDE 10 MEQ/50ML IV SOLN
10.0000 meq | INTRAVENOUS | Status: AC
  Administered 2017-01-23 (×3): 10 meq via INTRAVENOUS
  Filled 2017-01-23 (×3): qty 50

## 2017-01-23 MED ORDER — TRACE MINERALS CR-CU-MN-SE-ZN 10-1000-500-60 MCG/ML IV SOLN
INTRAVENOUS | Status: AC
  Administered 2017-01-23: 18:00:00 via INTRAVENOUS
  Filled 2017-01-23: qty 2520

## 2017-01-23 MED ORDER — METRONIDAZOLE IN NACL 5-0.79 MG/ML-% IV SOLN
500.0000 mg | Freq: Three times a day (TID) | INTRAVENOUS | Status: DC
  Administered 2017-01-23 – 2017-02-02 (×32): 500 mg via INTRAVENOUS
  Filled 2017-01-23 (×34): qty 100

## 2017-01-23 MED ORDER — DEXTROSE 5 % IV SOLN
2.0000 g | Freq: Three times a day (TID) | INTRAVENOUS | Status: DC
  Administered 2017-01-23 – 2017-01-27 (×13): 2 g via INTRAVENOUS
  Filled 2017-01-23 (×15): qty 2

## 2017-01-23 MED ORDER — LIDOCAINE HCL (PF) 1 % IJ SOLN
INTRAMUSCULAR | Status: AC
Start: 2017-01-23 — End: 2017-01-23
  Filled 2017-01-23: qty 30

## 2017-01-23 MED ORDER — PREMIER PROTEIN SHAKE
2.0000 [oz_av] | Freq: Four times a day (QID) | ORAL | Status: DC
  Administered 2017-01-23 – 2017-02-02 (×22): 2 [oz_av] via ORAL
  Filled 2017-01-23 (×63): qty 325.31

## 2017-01-23 MED ORDER — IOPAMIDOL (ISOVUE-300) INJECTION 61%
INTRAVENOUS | Status: AC
  Administered 2017-01-23: 75 mL via INTRAVENOUS
  Filled 2017-01-23: qty 150

## 2017-01-23 NOTE — Procedures (Signed)
Ultrasound-guided diagnostic and therapeutic left thoracentesis performed yielding 25cc of serosanguineous colored fluid. No immediate complications. Follow-up chest x-ray pending.       Franny Selvage E 1:22 PM 01/23/2017

## 2017-01-23 NOTE — Progress Notes (Addendum)
PROGRESS NOTE    Albert White  ZOX:[id number] DOB: 28-Jun-1975 DOA: 01/15/2017 PCP: Alysia Penna, MD   Brief Narrative:  41 year old male who presented with abdominal pain. Is known to have a recent Roux-en-Y/gastric bypass on 12/08/16 in [place] by Dr. Franki Cabot, rehospitalized 12/30/16-01/10/17 for anasarca, abdominal pain, fever of 103F and postoperative seroma for which a percutaneous drain was placed. Also has a diagnosis of type 2 diabetes mellitus, dyslipidemia and obstructive sleep apnea. After his discharge patient complained of persistent abdominal pain, associated with nausea, emesis and decreased appetite, outpatient CT of the abdomen showedgas containing 10.7 x 5 cm abscess abutting the posterior margin of the GJ anastomosis worrisome for dehiscence, diffuse peritonitis with large gas containing abscess throughout the peritoneal cavity including a right perihepatic abscess compressing the right liver lobe and provoking anileus, findings that prompted rehospitalization.   Patient was admitted to the hospital with the working diagnosis of abdominal sepsis due to peritonitis, and intra-abdominal abscess related to a postoperative complication of Roux-en-Y/gastric bypass (12/08/16).   Assessment & Plan:   Active Problems:   Postprocedural intraabdominal abscess   1. Sepsis, present on admission due to peritonitis and intra-abdominal abscess.  Antibiotic therapy with IV Zosyn (8/30 - ).  Will start vanc until gram positive cocci is further speciated (9/6 - ) Percutaneous drain in place (8/31) with purulent drainage in RLQ (some sediment visualized today). 75 cc in past 24 hrs.   Epigastric drain placed (9/5) with serosanguinous drainage. 45 cc in past 24 hrs 8/31 cx with rare gardenella vaginalis and rare strep constellatus 9/5 cx with gram positive cocci in pairs as well as gram variable rods Bcx 8/30 NGTDx5days Follow up ct with decreased in size of intra-abdominal  collections, but positive findings cw dehiscence/anastomotic leak, at the gastro jejunal anastomosis. S/p IR drainage today with additional drain placed.  Repeat cx pending  2. Thickening of Ascending colon/hepatic flexure concerning for colitis vs reactive inflammation:  Currently on zosyn, may be related to peritonitis/intrabdominal abscess above.  Had diarrhea yesterday, but resolved today.  CTM.    3. L greater than R pleural effusions:  Mod L pleural effusion on imaging.  Normal proBNP.  He does note some SOB. - plan for thora, which may help with sx.   2. Type 2 diabetes mellitus. Glucose cover and monitoring with sliding scale insulin, continue parenteral nutrition (receiving insulin with TPN as well).   3. Obstructive sleep apnea. Cpap,  oxymetry monitoring and as needed supplemental 02 per [place], to keep 02 saturation above 92%, continue incentive spirometer.   4. Chronic multifactorial anemia. No signs of active bleeding, will check cell counts in am.  5. Morbid obesity. SP bariatric surgery, high risk for inpatient complications  6. TPN per pharmacy  DVT prophylaxis: lovenox Code Status: full  Family Communication: wife on phone Disposition Plan: pending further improvement   Consultants:   Surgery  IR  pharmacy  Procedures: (Don't include imaging studies which can be auto populated. Include things that cannot be auto populated i.e. Echo, Carotid and venous dopplers, Foley, Bipap, HD, tubes/drains, wound vac, central lines etc)  Perc drain placement  Antimicrobials: (specify start and planned stop date. Auto populated tables are space occupying and do not give end dates)  zosyn    Subjective: Stable sx.  Abd pain unchanged.  No new sx.  Objective: Vitals:   01/22/17 2051 01/23/17 0443 01/23/17 0522 01/23/17 0601  BP: 136/86  136/84 138/85  Pulse: 98  96 (!)  101  Resp: 17  17   Temp: 98.5 F (36.9 C)  98.6 F (37 C)   TempSrc: Oral  Axillary   SpO2:  96%  97%   Weight:  (!) 153.4 kg (338 lb 1.6 oz)    Height:        Intake/Output Summary (Last 24 hours) at 01/23/17 0931 Last data filed at 01/23/17 [id number]  Gross per 24 hour  Intake             2880 ml  Output             2660 ml  Net              220 ml   Filed Weights   01/21/17 0502 01/22/17 0500 01/23/17 0443  Weight: (!) 150.5 kg (331 lb 12.8 oz) (!) 155.9 kg (343 lb 12.8 oz) (!) 153.4 kg (338 lb 1.6 oz)    Examination:  General: No acute distress.  Sitting up in chair.   Cardiovascular: Heart sounds show a regular rate, and rhythm. No gallops or rubs. No murmurs. No JVD. Lungs: Clear to auscultation bilaterally with good air movement. No rales, rhonchi or wheezes. Abdomen: Soft, mildly diffusely tender.  Drains in RLQ and epigastric region. No masses. No hepatosplenomegaly. Neurological: Alert and oriented 3. Moves all extremities 4 with equal strength. Cranial nerves II through XII grossly intact. Skin: Warm and dry. No rashes or lesions. Extremities: No clubbing or cyanosis. 1+edema. Pedal pulses 2+. Psychiatric: Mood and affect are normal. Insight and judgment are appropriate.  Data Reviewed: I have personally reviewed following labs and imaging studies  CBC:  Recent Labs Lab 01/17/17 0407 01/19/17 0516 01/21/17 0455 01/22/17 0553  WBC 5.8 6.2 6.0 5.3  NEUTROABS 4.1 4.3  --   --   HGB 8.9* 9.5* 8.9* 8.9*  HCT 29.0* 32.0* 30.1* 29.3*  MCV 86.3 86.5 86.2 85.4  PLT 478* 362 332 352   Basic Metabolic Panel:  Recent Labs Lab 01/18/17 0520 01/19/17 0516 01/20/17 1745 01/21/17 0455 01/22/17 0553  NA 136 135 125* 133* 134*  K 3.7 3.8 6.1* 4.1 4.1  CL 99* 99* 93* 97* 98*  CO2 31 30 21* 28 29  GLUCOSE 154* 163* 795* 164* 161*  BUN 5* 6 7 5* 5*  CREATININE 0.54* 0.53* 0.59* 0.53* 0.53*  CALCIUM 7.8* 7.9* 7.8* 7.9* 8.1*  MG 1.8 1.7 2.0 1.8 1.7  PHOS 4.1 4.6 6.9* 5.0* 5.3*   GFR: Estimated Creatinine Clearance: 182.6 mL/min (A) (by C-G formula based on  SCr of 0.53 mg/dL (L)). Liver Function Tests:  Recent Labs Lab 01/17/17 0407 01/19/17 0516 01/22/17 0553  AST 19 19 30   ALT 13* 11* 18  ALKPHOS 168* 116 139*  BILITOT 0.7 0.3 0.6  PROT 5.8* 5.7* 5.7*  ALBUMIN 1.7* 1.6* 1.6*   No results for input(s): LIPASE, AMYLASE in the last 168 hours. No results for input(s): AMMONIA in the last 168 hours. Coagulation Profile:  Recent Labs Lab 01/20/17 1744  INR 1.19   Cardiac Enzymes: No results for input(s): CKTOTAL, CKMB, CKMBINDEX, TROPONINI in the last 168 hours. BNP (last 3 results) No results for input(s): PROBNP in the last 8760 hours. HbA1C: No results for input(s): HGBA1C in the last 72 hours. CBG:  Recent Labs Lab 01/22/17 1555 01/22/17 1942 01/22/17 2336 01/23/17 0350 01/23/17 0737  GLUCAP 126* 129* 162* 143* 145*   Lipid Profile: No results for input(s): CHOL, HDL, LDLCALC, TRIG, CHOLHDL, LDLDIRECT in the last 72  hours. Thyroid Function Tests: No results for input(s): TSH, T4TOTAL, FREET4, T3FREE, THYROIDAB in the last 72 hours. Anemia Panel:  Recent Labs  01/21/17 2025  VITAMINB12 809  FOLATE 7.5  FERRITIN 315  TIBC 119*  IRON 15*   Sepsis Labs: No results for input(s): PROCALCITON, LATICACIDVEN in the last 168 hours.  Recent Results (from the past 240 hour(s))  Culture, blood (Routine x 2)     Status: None   Collection Time: 01/15/17  2:20 PM  Result Value Ref Range Status   Specimen Description BLOOD LEFT HAND  Final   Special Requests   Final    BOTTLES DRAWN AEROBIC AND ANAEROBIC Blood Culture adequate volume   Culture NO GROWTH 5 DAYS  Final   Report Status 01/20/2017 FINAL  Final  Culture, blood (Routine x 2)     Status: None   Collection Time: 01/15/17  3:25 PM  Result Value Ref Range Status   Specimen Description BLOOD RIGHT HAND  Final   Special Requests   Final    BOTTLES DRAWN AEROBIC AND ANAEROBIC Blood Culture adequate volume   Culture NO GROWTH 5 DAYS  Final   Report Status  01/20/2017 FINAL  Final  MRSA PCR Screening     Status: None   Collection Time: 01/15/17 11:04 PM  Result Value Ref Range Status   MRSA by PCR NEGATIVE NEGATIVE Final    Comment:        The GeneXpert MRSA Assay (FDA approved for NASAL specimens only), is one component of a comprehensive MRSA colonization surveillance program. It is not intended to diagnose MRSA infection nor to guide or monitor treatment for MRSA infections.   Aerobic/Anaerobic Culture (surgical/deep wound)     Status: None   Collection Time: 01/16/17  2:21 PM  Result Value Ref Range Status   Specimen Description ABSCESS ABDOMEN  Final   Special Requests INTRA ABDOMINAL ABSCESS  Final   Gram Stain   Final    ABUNDANT WBC PRESENT, PREDOMINANTLY PMN NO ORGANISMS SEEN    Culture   Final    RARE GARDNERELLA VAGINALIS Standardized susceptibility testing for this organism is not available. RARE STREPTOCOCCUS CONSTELLATUS CALL MICROBIOLOGY LAB IF SENSITIVITIES ARE REQUIRED.    Report Status 01/20/2017 FINAL  Final  Aerobic/Anaerobic Culture (surgical/deep wound)     Status: None (Preliminary result)   Collection Time: 01/21/17 10:04 AM  Result Value Ref Range Status   Specimen Description ABSCESS  Final   Special Requests DRAIN POST CT GUIDED PLACEMENT  Final   Gram Stain   Final    ABUNDANT WBC PRESENT,BOTH PMN AND MONONUCLEAR ABUNDANT GRAM POSITIVE COCCI IN PAIRS ABUNDANT GRAM VARIABLE ROD    Culture ABUNDANT GRAM NEGATIVE RODS  Final   Report Status PENDING  Incomplete         Radiology Studies: Dg Chest Left Decubitus  Result Date: 01/22/2017 CLINICAL DATA:  Shortness of breath.  Known left pleural effusion. EXAM: CHEST - LEFT DECUBITUS COMPARISON:  Portable chest x-ray of today's date. FINDINGS: There is a moderate size left pleural effusion. There is a free-flowing component of this E fusion. In addition, there is a small right pleural effusion which layers medially. IMPRESSION: Moderate-sized left  pleural effusion with a free-flowing component. Small right pleural effusion with free-flowing component. Electronically Signed   By: David  [place]az[place]and M.D.   On: 01/22/2017 11:19   Dg Chest Port 1 View  Result Date: 01/21/2017 CLINICAL DATA:  Pleural effusion.  PICC line placement. EXAM: PORTABLE  CHEST 1 VIEW COMPARISON:  Two-view chest x-ray 01/15/2017 FINDINGS: A right-sided PICC line is in place.  The tip is in the mid SVC. A left pleural effusion has increased. A small right pleural effusion is again noted. Bibasilar airspace disease likely reflects atelectasis. Low lung volumes exaggerate the heart size. Mild pulmonary vascular congestion has increased. IMPRESSION: 1. Increasing left pleural effusion. 2. Cardiomegaly and progressive pulmonary vascular congestion. 3. Interval placement of right-sided PICC line. The tip is in the mid SVC. 4. Bibasilar airspace disease likely reflects atelectasis. Electronically Signed   By: Marin Roberts M.D.   On: 01/21/2017 16:48   Ct Image Guided Drainage By Percutaneous Catheter  Result Date: 01/21/2017 INDICATION: History of reactive surgery performed at outside institution, post placement of CT-guided percutaneous drainage catheter within the right lower abdomen / pelvis on 01/16/2017 with subsequent abdominal CT performed 01/19/2017 demonstrating a residual perigastric abscess. Request made for placement of a additional percutaneous drainage catheter for infection source control purposes. EXAM: CT IMAGE GUIDED DRAINAGE BY PERCUTANEOUS CATHETER COMPARISON:  CT abdomen pelvis - 01/19/2017; 01/15/2017; CT-guided percutaneous drainage catheter placement - 01/16/2017 MEDICATIONS: The patient is currently admitted to the hospital and receiving intravenous antibiotics. The antibiotics were administered within an appropriate time frame prior to the initiation of the procedure. ANESTHESIA/SEDATION: Moderate (conscious) sedation was employed during this procedure. A total  of Versed 2 mg and Fentanyl 100 mcg was administered intravenously. Moderate Sedation Time: 15 minutes. The patient's level of consciousness and vital signs were monitored continuously by radiology nursing throughout the procedure under my direct supervision. CONTRAST:  None COMPLICATIONS: None immediate. PROCEDURE: Informed written consent was obtained from the patient after a discussion of the risks, benefits and alternatives to treatment. The patient was placed supine on the CT gantry and a pre procedural CT was performed re-demonstrating the known abscess/fluid collection within the upper abdomen adjacent to the stomach and gastric anastomosis with dominant component measuring approximately 3.7 x 10.6 cm (image 24, series 3). The procedure was planned. A timeout was performed prior to the initiation of the procedure. The skin overlying the midline of the upper abdomen was prepped and draped in the usual sterile fashion. The overlying soft tissues were anesthetized with 1% lidocaine with epinephrine. Appropriate trajectory was planned with the use of a 22 gauge spinal needle. An 18 gauge trocar needle was advanced into the abscess/fluid collection and a short Amplatz super stiff wire was coiled within the collection. Appropriate positioning was confirmed with a limited CT scan. The tract was serially dilated allowing placement of a 10 [place] all-purpose drainage catheter. Appropriate positioning was confirmed with a limited postprocedural CT scan. Approximately 100 cc of purulent fluid was aspirated. The tube was connected to a drainage bag and sutured in place. A dressing was placed. The patient tolerated the procedure well without immediate post procedural complication. IMPRESSION: Successful CT guided placement of a 10 French all purpose drain catheter into the residual perigastric abscess with aspiration of 100 cc of purulent fluid. Samples were sent to the laboratory as requested by the ordering clinical  team. Electronically Signed   By: Simonne Come M.D.   On: 01/21/2017 10:11        Scheduled Meds: . chlorhexidine  15 mL Mouth Rinse BID  . Chlorhexidine Gluconate Cloth  6 each Topical Daily  . enoxaparin (LOVENOX) injection  40 mg Subcutaneous Q12H  . hydrocortisone   Rectal BID  . insulin aspart  0-20 Units Subcutaneous Q4H  . mouth  rinse  15 mL Mouth Rinse q12n4p  . pneumococcal 23 valent vaccine  0.5 mL Intramuscular Tomorrow-1000  . sodium chloride flush  5 mL Intravenous Q8H  . sodium chloride flush  5 mL Intravenous Q8H   Continuous Infusions: . sodium chloride 35 mL/hr at 01/23/17 0608  . methocarbamol (ROBAXIN)  IV Stopped (01/23/17 1610)  . piperacillin-tazobactam (ZOSYN)  IV 3.375 g (01/23/17 9604)  . Marland KitchenTPN (CLINIMIX-E) Adult 100 mL/hr at 01/23/17 0608  . vancomycin Stopped (01/23/17 0544)     LOS: 8 days    Time spent: 35 min    A Grier Mitts, MD [facility] [phone]   If 7PM-7AM, please contact night-coverage [email or web address] Password [email or web address] 01/23/2017, 9:31 AM

## 2017-01-23 NOTE — Progress Notes (Signed)
[facility] Progress Note     Subjective: CC: abdominal pain Patient with unchanged abdominal pain in LUQ. Denies nausea. Sitting up in chair.   Objective: Vital signs in last 24 hours: Temp:  [98.5 F (36.9 C)-98.6 F (37 C)] 98.6 F (37 C) (09/07 0522) Pulse Rate:  [96-101] 101 (09/07 0601) Resp:  [17-18] 17 (09/07 0522) BP: (136-138)/(84-90) 138/85 (09/07 0601) SpO2:  [94 %-97 %] 97 % (09/07 0522) Weight:  [153.4 kg (338 lb 1.6 oz)] 153.4 kg (338 lb 1.6 oz) (09/07 0443) Last BM Date: 01/22/17  Intake/Output from previous day: 09/06 0701 - 09/07 0700 In: 2880 [I.V.:2500; IV Piggyback:360] Out: 3460 [Urine:3340; Drains:120] Intake/Output this shift: No intake/output data recorded.  PE: Gen:  Alert, NAD, pleasant Card:  Regular rate and rhythm Pulm:  Normal effort, clear to auscultation bilaterally Abd: Soft, TTP in left abdomen, non-distended; upper and lower drains both with minimal serosanguinous fluid Skin: warm and dry, no rashes  Psych: A&Ox3   Lab Results:   Recent Labs  01/21/17 0455 01/22/17 0553  WBC 6.0 5.3  HGB 8.9* 8.9*  HCT 30.1* 29.3*  PLT 332 352   BMET  Recent Labs  01/21/17 0455 01/22/17 0553  NA 133* 134*  K 4.1 4.1  CL 97* 98*  CO2 28 29  GLUCOSE 164* 161*  BUN 5* 5*  CREATININE 0.53* 0.53*  CALCIUM 7.9* 8.1*   PT/INR  Recent Labs  01/20/17 1744  LABPROT 15.0  INR 1.19   CMP     Component Value Date/Time   NA 134 (L) 01/22/2017 0553   K 4.1 01/22/2017 0553   CL 98 (L) 01/22/2017 0553   CO2 29 01/22/2017 0553   GLUCOSE 161 (H) 01/22/2017 0553   BUN 5 (L) 01/22/2017 0553   CREATININE 0.53 (L) 01/22/2017 0553   CALCIUM 8.1 (L) 01/22/2017 0553   PROT 5.7 (L) 01/22/2017 0553   ALBUMIN 1.6 (L) 01/22/2017 0553   AST 30 01/22/2017 0553   ALT 18 01/22/2017 0553   ALKPHOS 139 (H) 01/22/2017 0553   BILITOT 0.6 01/22/2017 0553   GFRNONAA >60 01/22/2017 0553   GFRAA >60 01/22/2017 0553   Lipase  No results  found for: LIPASE     Studies/Results: Dg Chest Left Decubitus  Result Date: 01/22/2017 CLINICAL DATA:  Shortness of breath.  Known left pleural effusion. EXAM: CHEST - LEFT DECUBITUS COMPARISON:  Portable chest x-ray of today's date. FINDINGS: There is a moderate size left pleural effusion. There is a free-flowing component of this E fusion. In addition, there is a small right pleural effusion which layers medially. IMPRESSION: Moderate-sized left pleural effusion with a free-flowing component. Small right pleural effusion with free-flowing component. Electronically Signed   By: David  [place] M.D.   On: 01/22/2017 11:19   Dg Chest Port 1 View  Result Date: 01/21/2017 CLINICAL DATA:  Pleural effusion.  PICC line placement. EXAM: PORTABLE CHEST 1 VIEW COMPARISON:  Two-view chest x-ray 01/15/2017 FINDINGS: A right-sided PICC line is in place.  The tip is in the mid SVC. A left pleural effusion has increased. A small right pleural effusion is again noted. Bibasilar airspace disease likely reflects atelectasis. Low lung volumes exaggerate the heart size. Mild pulmonary vascular congestion has increased. IMPRESSION: 1. Increasing left pleural effusion. 2. Cardiomegaly and progressive pulmonary vascular congestion. 3. Interval placement of right-sided PICC line. The tip is in the mid SVC. 4. Bibasilar airspace disease likely reflects atelectasis. Electronically Signed   By: Marin Roberts  M.D.   On: 01/21/2017 16:48   Ct Image Guided Drainage By Percutaneous Catheter  Result Date: 01/21/2017 INDICATION: History of reactive surgery performed at outside institution, post placement of CT-guided percutaneous drainage catheter within the right lower abdomen / pelvis on 01/16/2017 with subsequent abdominal CT performed 01/19/2017 demonstrating a residual perigastric abscess. Request made for placement of a additional percutaneous drainage catheter for infection source control purposes. EXAM: CT IMAGE GUIDED  DRAINAGE BY PERCUTANEOUS CATHETER COMPARISON:  CT abdomen pelvis - 01/19/2017; 01/15/2017; CT-guided percutaneous drainage catheter placement - 01/16/2017 MEDICATIONS: The patient is currently admitted to the hospital and receiving intravenous antibiotics. The antibiotics were administered within an appropriate time frame prior to the initiation of the procedure. ANESTHESIA/SEDATION: Moderate (conscious) sedation was employed during this procedure. A total of Versed 2 mg and Fentanyl 100 mcg was administered intravenously. Moderate Sedation Time: 15 minutes. The patient's level of consciousness and vital signs were monitored continuously by radiology nursing throughout the procedure under my direct supervision. CONTRAST:  None COMPLICATIONS: None immediate. PROCEDURE: Informed written consent was obtained from the patient after a discussion of the risks, benefits and alternatives to treatment. The patient was placed supine on the CT gantry and a pre procedural CT was performed re-demonstrating the known abscess/fluid collection within the upper abdomen adjacent to the stomach and gastric anastomosis with dominant component measuring approximately 3.7 x 10.6 cm (image 24, series 3). The procedure was planned. A timeout was performed prior to the initiation of the procedure. The skin overlying the midline of the upper abdomen was prepped and draped in the usual sterile fashion. The overlying soft tissues were anesthetized with 1% lidocaine with epinephrine. Appropriate trajectory was planned with the use of a 22 gauge spinal needle. An 18 gauge trocar needle was advanced into the abscess/fluid collection and a short Amplatz super stiff wire was coiled within the collection. Appropriate positioning was confirmed with a limited CT scan. The tract was serially dilated allowing placement of a 10 [place] all-purpose drainage catheter. Appropriate positioning was confirmed with a limited postprocedural CT scan. Approximately  100 cc of purulent fluid was aspirated. The tube was connected to a drainage bag and sutured in place. A dressing was placed. The patient tolerated the procedure well without immediate post procedural complication. IMPRESSION: Successful CT guided placement of a 10 French all purpose drain catheter into the residual perigastric abscess with aspiration of 100 cc of purulent fluid. Samples were sent to the laboratory as requested by the ordering clinical team. Electronically Signed   By: Simonne Come M.D.   On: 01/21/2017 10:11    Anti-infectives: Anti-infectives    Start     Dose/Rate Route Frequency Ordered Stop   01/22/17 2000  vancomycin (VANCOCIN) 1,250 mg in sodium chloride 0.9 % 250 mL IVPB     1,250 mg 166.7 mL/hr over 90 Minutes Intravenous Every 8 hours 01/22/17 1202     01/22/17 1230  vancomycin (VANCOCIN) 2,500 mg in sodium chloride 0.9 % 500 mL IVPB     2,500 mg 250 mL/hr over 120 Minutes Intravenous  Once 01/22/17 1147 01/22/17 1438   01/15/17 1815  piperacillin-tazobactam (ZOSYN) IVPB 3.375 g  Status:  Discontinued     3.375 g 12.5 mL/hr over 240 Minutes Intravenous Every 8 hours 01/15/17 1804 01/15/17 1804   01/15/17 1630  piperacillin-tazobactam (ZOSYN) IVPB 3.375 g     3.375 g 12.5 mL/hr over 240 Minutes Intravenous Every 8 hours 01/15/17 1616  Assessment/Plan Multiple intra-abdominal abscesses Early Sepsis Status post Roux-en-Y/gastric bypass,12/08/16, Dr. Clent RidgesWalsh Fluid overload/ seroma with drainage 8/14-8/25/18 [facility] Sours- [place] S/P perc drain 01/16/17 S/p IR drain 01/21/17  - 100 cc purulent fluid drained in the IR suite - continue TPN - will need home TPN - R medial abd drain with 75 cc output; midline abd drain with 45 cc output - WBC 5.3 yesterday, afebrile - mobilize - upper GI with water soluble contrast ordered  DM HLD Morbid Obesity Body mass index: 47.6 OSA with hx of CPAP  FEN - NPO, TPN VTE - SCDs, lovenox ID - IV Zosyn (8/30>>), IV Vanc  (9/6>>)   Plan: UGI study ordered. Continue drains, continue TPN. Will need to transition to home TPN. Eventual surgical G-tube and surgical assessment of leak per bariatric specialist  LOS: 8 days    Wells GuilesKelly Rayburn , [facility] 01/23/2017, 9:46 AM Pager: [phone] Consults: [phone] Mon-Fri 7:00 am-4:30 pm Sat-Sun 7:00 am-11:30 am

## 2017-01-23 NOTE — Progress Notes (Signed)
[facility]  [facility].  [facility] to support TPN at home upon DC.  [facility] while inpatient to support transition home.  [facility] will provide hands on TPN teaching prior to DC to support competency and independence at home.   If patient discharges after hours, please call [phone].   Sedalia Mutaamela S Chandler 01/23/2017, 11:35 AM

## 2017-01-23 NOTE — Progress Notes (Signed)
PHARMACY - ADULT TOTAL PARENTERAL NUTRITION CONSULT NOTE   Pharmacy Consult:  TPN Indication:  Prolonged NPO  Patient Measurements: Height: _0  (177.8 cm) Weight: (!) 338 lb 1.6 oz (153.4 kg) IBW/kg (Calculated) : 73 TPN AdjBW (KG): 93.4 Body mass index is 48.51 kg/m.  Assessment:  41 year old obese male s/p laparoscopic Roux-en-Y/gastric bypass surgery on 12/08/16 and hospitalization 8/14 through 01/10/17 for anasarca, abdominal pain, fever, and postoperative seroma with percutaneous drain placement now re-admitted with continued abdominal pain, occasional nausea, non-bloddy emesis, weight loss (on Lasix), and decreased appetite. CT revealed multiple large gas containing intra-abdominal abscesses concerning for dehiscence.   GI: anasarca/vol overload with perc drain placed 8/31 and 9/5, O/P 141m.  Prealbumin low at 5.3, LBM 9/5.  Pepcid in TPN, PRN Zofran, Anusol-HC for hemorrhoids Endo: DM2 on metformin PTA - CBGs controlled Insulin requirements in past 24 hrs: 21 units rSSI + 25 units in TPN Lytes: 9/6 labs - CoCa 10 (Ca x Phos = 53), others lytes WNL Renal: SCr 0.53 stable - UOP 0.9 ml/kg/hr, NS 35 ml/hr Pulm: remains on 2L [place] (CPAP at home) Cards: HLD - VSS Hepatobil:  LFTs/ tbili / TG WN.  Alk Phos elevated at 139. Neuro: pain score 8-9, Robaxin Q8H, PRN Fentanyl/Dilaudid ID: Abx#[id number] - Vanc/Zosyn for peritonitis/intra-abd abscess.  Rare gardnerella/Strep constellatus in abdominal abscess cx.  Afebrile, WBC WNL  Zosyn 8/30 >> Vanc 9/6 >>  8/30 MRSA PCR - negative 8/30 BCx - negative 8/31 abd abscess cx - Gardnerella vaginalis, Strep constellatus 9/5 abscess cx - GNR, GPC, GVR on Gram stain  Best Practices: Lovenox 40 BID TPN Access: double lumen PICC placed 01/16/17 TPN start date: 01/16/17  Nutritional Goals (per RD recommendation 8/31): 2300-2500 kCal and 115-130 gm protein per day   Current Nutrition:  TPN   Plan:  - Increase Clinimix 5/15 to 105 ml/hr (remove  lytes today d/t concern of elevated Ca-Phos product).  Continue 20% ILE at 20 ml/hr x 12 hrs.  TPN provides 2269 kCal and 126gm protein per day, meeting ~100% of needs. - Daily multivitamin and trace elements in TPN - Pepcid 45mdaily in TPN  - Continue resistant SSI Q4H + 25 units regular insulin in TPN - KCL x 3 runs and Mag sulfate 2gm IV x 1 (since not getting any in TPN tonight) - F/U AM labs, iron supplementation when possible, pain control  - Vanc 125058mV Q8H - Zosyn 3.375gm IV Q8H, 4 hr infusion - Monitor renal fxn, micro data, vanc trough tomorrow   Janean Eischen D. DanMina MarbleharmD, BCPS Pager:  [phone]/2018, 9:58 AM

## 2017-01-23 NOTE — Care Management Note (Signed)
Case Management Note  Patient Details  Name: Charlyne PetrinSteven T Quizon MRN: [id number] Date of Birth: December 19, 1975  Subjective/Objective:                    Action/Plan:  Spoke with MD this am. Plan is home with TPN at the earliest this Monday.   Discussed discharge planning with patient and wife Gaetano NetMiranda Griffith [phone] at bedside.  Choice offered they would like [facility] for both infusion company and home health RN. [facility]  Wife wanting FMLA papers completed. Spoke to VeazieAllison with [facility] [employer] . Revonda StandardAllison requesting release of information form to be completed and faxed along with the FMLA papers to her at [phone]. Same explained to patient and wife. Release of information completed and faxed to [facility]  . Wife stated she will print off FMLA papers and [facility]. [facility] number [phone] given to patients wife. Offered to fax papers once she has them.  Expected Discharge Date:                  Expected Discharge Plan:  Home w Home Health Services  In-House Referral:     Discharge planning Services  CM Consult  Post Acute Care Choice:  Home Health Choice offered to:  Patient, Spouse  DME Arranged:    DME Agency:     HH Arranged:  RN HH Agency:  [facility]  Status of Service:  In process, will continue to follow  If discussed at Long Length of Stay Meetings, dates discussed:    Additional Comments:  Kingsley PlanWile, Kashena Novitski Marie, RN 01/23/2017, 10:11 AM

## 2017-01-23 NOTE — Progress Notes (Signed)
Referring Physician(s): Dr Fredonia HighlandE Wilson  Supervising Physician: Malachy MoanMcCullough, Heath  [facility]  Chief Complaint:  Post gastric bypass surgery intra abd abscesses  Subjective:  Low drain placed 8/31 Upper drain placed 9/5 Output is good for both \\new  pleural effusion ---for thora today  Allergies: Patient has no known allergies.  Medications: Prior to Admission medications   Medication Sig Start Date End Date Taking? Authorizing Provider  acetaminophen (TYLENOL) 325 MG tablet Take 2 tablets (650 mg total) by mouth every 8 (eight) hours. Patient taking differently: Take 650 mg by mouth every 8 (eight) hours as needed for moderate pain or headache.  02/27/16  Yes Mesner, Barbara CowerJason, MD  atorvastatin (LIPITOR) 10 MG tablet Take 10 mg by mouth every evening.  09/18/15  Yes [provider]  calcium carbonate (TUMS - DOSED IN MG ELEMENTAL CALCIUM) 500 MG chewable tablet Chew 500 mg by mouth as needed for heartburn.   Yes [provider]  nystatin (MYCOSTATIN) 100000 UNIT/ML suspension Take by mouth as needed. Thrush. 1 tsp 11/27/16  Yes [provider]  omeprazole (PRILOSEC) 20 MG capsule Take 20 mg by mouth 2 (two) times daily. 11/27/16  Yes [provider]  OVER THE COUNTER MEDICATION Apply 1 application topically as needed (eczema). [employer] Eczema Cream    Yes [provider]  oxyCODONE (ROXICODONE) 5 MG immediate release tablet Take 1 tablet (5 mg total) by mouth every 6 (six) hours as needed for severe pain. 03/05/16  Yes Ghimire, Werner LeanShanker M, MD  traZODone (DESYREL) 100 MG tablet Take 100 mg by mouth at bedtime.   Yes [provider]  ursodiol (ACTIGALL) 300 MG capsule Take 300 mg by mouth 2 (two) times daily. 11/27/16  Yes [provider]  cephALEXin (KEFLEX) 500 MG capsule Take 1 capsule (500 mg total) by mouth 3 (three) times daily. Patient not taking: Reported on 01/15/2017 03/05/16   Maretta BeesGhimire, Shanker M, MD    doxycycline (VIBRA-TABS) 100 MG tablet Take 1 tablet (100 mg total) by mouth 2 (two) times daily. Patient not taking: Reported on 01/15/2017 03/05/16   Maretta BeesGhimire, Shanker M, MD  metFORMIN (GLUCOPHAGE) 500 MG tablet Take 2 tablets (1,000 mg total) by mouth 2 (two) times daily with a meal. Patient not taking: Reported on 01/15/2017 03/05/16   Maretta BeesGhimire, Shanker M, MD     Vital Signs: BP 138/85 (BP Location: Left Arm)   Pulse (!) 101   Temp 98.6 F (37 C) (Axillary)   Resp 17   Ht 5\' 10"  (1.778 m)   Wt (!) 338 lb 1.6 oz (153.4 kg)   SpO2 97%   BMI 48.51 kg/m   Physical Exam  Constitutional: He is oriented to person, place, and time.  Abdominal: Soft. Bowel sounds are normal.  Musculoskeletal: Normal range of motion.  Neurological: He is alert and oriented to person, place, and time.  Skin: Skin is warm and dry.  Sites of drains are clean and dry Sl tender No sign of infection OP of higher drain is blood tinged--- 45 cc yesterday; 30 cc in bag  OP low drain is milky---75 cc yesterday; 40 cc in bag   Nursing note and vitals reviewed.   Imaging: Dg Chest Left Decubitus  Result Date: 01/22/2017 CLINICAL DATA:  Shortness of breath.  Known left pleural effusion. EXAM: CHEST - LEFT DECUBITUS COMPARISON:  Portable chest x-ray of today's date. FINDINGS: There is a moderate size left pleural effusion. There is a free-flowing component of  this E fusion. In addition, there is a small right pleural effusion which layers medially. IMPRESSION: Moderate-sized left pleural effusion with a free-flowing component. Small right pleural effusion with free-flowing component. Electronically Signed   By: David  [place] M.D.   On: 01/22/2017 11:19   Ct Abdomen Pelvis W Contrast  Result Date: 01/19/2017 CLINICAL DATA:  Follow-up intestinal bypass and anastomosis status EXAM: CT ABDOMEN AND PELVIS WITH CONTRAST TECHNIQUE: Multidetector CT imaging of the abdomen and pelvis was performed using the standard  protocol following bolus administration of intravenous contrast. CONTRAST:  100 mL Isovue-300 intravenous COMPARISON:  01/16/2017, 01/15/2017 FINDINGS: Lower chest: Moderate left-sided pleural effusion and small right pleural effusion, slightly increased compared to prior. Bilateral lower lobe consolidations may reflect atelectasis or pneumonia. Heart size is within normal limits. Hepatobiliary: Small hypodense collection along the left margin of the liver. Decreased rim enhancing sub capsular fluid around the liver. Residual lenticular shaped collection measuring 16 cm in AP by 3.3 cm transverse. No calcified gallstones. No biliary dilatation. Pancreas: Unremarkable. No pancreatic ductal dilatation or surrounding inflammatory changes. Spleen: Normal in size without focal abnormality. Decreased rim enhancing left perisplenic fluid collection, measuring 11.7 cm by 2.2 cm compared with 12.1 x 2.4 cm previously. Adrenals/Urinary Tract: Adrenal glands are within normal limits. 3 mm stone in the upper pole of the left kidney. No hydronephrosis. The bladder is unremarkable small cyst lower pole left kidney. Stomach/Bowel: Patient is status post gastric bypass. Slight increased size of a gas and fluid collection, adjacent to the suture line, this measures 10.9 x 5.8 cm compared with 10.7 x 5 cm previously. It is now contiguous with a small gas and fluid collection between the gastric pouch and anterior aspect of the liver. Additional hypodense fluid collection measuring about 4.7 by 2.4 cm adjacent to the pyloric region of the excluded stomach. Previously this measured 2.5 x 4.3 cm. There is no evidence for a bowel obstruction. There is mild wall thickening of the hepatic flexure and right colon. Vascular/Lymphatic: Aortic atherosclerosis. No enlarged abdominal or pelvic lymph nodes. Reproductive: Prostate is unremarkable. Other: Increased fluid in the pelvis/presacral space. Interval placement of a right lower  percutaneous drainage catheter with markedly decreased in size of the previously noted large peritoneal fluid collection. No discrete residual measurable collection at this time. Small scattered rim enhancing fluid collections are visualized in the right gutter, inferior margin of the liver, and in the left upper quadrant of the abdomen. Musculoskeletal: Degenerative changes. No acute or suspicious findings. IMPRESSION: 1. Interim placement of right lower quadrant percutaneous drainage catheter with significant decrease in size of previously noted large intraperitoneal fluid collection. Interval decrease in size of large right perihepatic and perisplenic rim enhancing fluid collections with residuals as described above. 2. Slight interval increase in size of a gas and fluid containing collection adjacent to the gastric sutures/gastro jejunal anastomosis. Gas and fluid collection now extend slightly more anterior, between the gastric pouch and the liver. Given that the other collections have decreased in size, findings would be concerning for dehiscence/anastomotic leak. 3. Additional scattered small rim enhancing fluid collections within the abdomen and pelvis. 4. Mild wall thickening of the ascending colon and hepatic flexure suggesting a colitis or reactive inflammation. Residual hazy edema and inflammation of the central and right upper quadrant mesentery. 5. Slight increased pleural effusions with continued bilateral lower lobe atelectasis or pneumonia 6. Nonobstructing stone in the left kidney Electronically Signed   By: Jasmine Pang M.D.   On: 01/19/2017  21:19   Dg Chest Port 1 View  Result Date: 01/21/2017 CLINICAL DATA:  Pleural effusion.  PICC line placement. EXAM: PORTABLE CHEST 1 VIEW COMPARISON:  Two-view chest x-ray 01/15/2017 FINDINGS: A right-sided PICC line is in place.  The tip is in the mid SVC. A left pleural effusion has increased. A small right pleural effusion is again noted. Bibasilar  airspace disease likely reflects atelectasis. Low lung volumes exaggerate the heart size. Mild pulmonary vascular congestion has increased. IMPRESSION: 1. Increasing left pleural effusion. 2. Cardiomegaly and progressive pulmonary vascular congestion. 3. Interval placement of right-sided PICC line. The tip is in the mid SVC. 4. Bibasilar airspace disease likely reflects atelectasis. Electronically Signed   By: Marin Roberts M.D.   On: 01/21/2017 16:48   Ct Image Guided Drainage By Percutaneous Catheter  Result Date: 01/21/2017 INDICATION: History of reactive surgery performed at outside institution, post placement of CT-guided percutaneous drainage catheter within the right lower abdomen / pelvis on 01/16/2017 with subsequent abdominal CT performed 01/19/2017 demonstrating a residual perigastric abscess. Request made for placement of a additional percutaneous drainage catheter for infection source control purposes. EXAM: CT IMAGE GUIDED DRAINAGE BY PERCUTANEOUS CATHETER COMPARISON:  CT abdomen pelvis - 01/19/2017; 01/15/2017; CT-guided percutaneous drainage catheter placement - 01/16/2017 MEDICATIONS: The patient is currently admitted to the hospital and receiving intravenous antibiotics. The antibiotics were administered within an appropriate time frame prior to the initiation of the procedure. ANESTHESIA/SEDATION: Moderate (conscious) sedation was employed during this procedure. A total of Versed 2 mg and Fentanyl 100 mcg was administered intravenously. Moderate Sedation Time: 15 minutes. The patient's level of consciousness and vital signs were monitored continuously by radiology nursing throughout the procedure under my direct supervision. CONTRAST:  None COMPLICATIONS: None immediate. PROCEDURE: Informed written consent was obtained from the patient after a discussion of the risks, benefits and alternatives to treatment. The patient was placed supine on the CT gantry and a pre procedural CT was  performed re-demonstrating the known abscess/fluid collection within the upper abdomen adjacent to the stomach and gastric anastomosis with dominant component measuring approximately 3.7 x 10.6 cm (image 24, series 3). The procedure was planned. A timeout was performed prior to the initiation of the procedure. The skin overlying the midline of the upper abdomen was prepped and draped in the usual sterile fashion. The overlying soft tissues were anesthetized with 1% lidocaine with epinephrine. Appropriate trajectory was planned with the use of a 22 gauge spinal needle. An 18 gauge trocar needle was advanced into the abscess/fluid collection and a short Amplatz super stiff wire was coiled within the collection. Appropriate positioning was confirmed with a limited CT scan. The tract was serially dilated allowing placement of a 10 [place] all-purpose drainage catheter. Appropriate positioning was confirmed with a limited postprocedural CT scan. Approximately 100 cc of purulent fluid was aspirated. The tube was connected to a drainage bag and sutured in place. A dressing was placed. The patient tolerated the procedure well without immediate post procedural complication. IMPRESSION: Successful CT guided placement of a 10 French all purpose drain catheter into the residual perigastric abscess with aspiration of 100 cc of purulent fluid. Samples were sent to the laboratory as requested by the ordering clinical team. Electronically Signed   By: Simonne Come M.D.   On: 01/21/2017 10:11    Labs:  CBC:  Recent Labs  01/17/17 0407 01/19/17 0516 01/21/17 0455 01/22/17 0553  WBC 5.8 6.2 6.0 5.3  HGB 8.9* 9.5* 8.9* 8.9*  HCT 29.0*  32.0* 30.1* 29.3*  PLT 478* 362 332 352    COAGS:  Recent Labs  01/15/17 1420 01/20/17 1744  INR 1.07 1.19    BMP:  Recent Labs  01/19/17 0516 01/20/17 1745 01/21/17 0455 01/22/17 0553  NA 135 125* 133* 134*  K 3.8 6.1* 4.1 4.1  CL 99* 93* 97* 98*  CO2 30 21* 28 29   GLUCOSE 163* 795* 164* 161*  BUN 6 7 5* 5*  CALCIUM 7.9* 7.8* 7.9* 8.1*  CREATININE 0.53* 0.59* 0.53* 0.53*  GFRNONAA >60 >60 >60 >60  GFRAA >60 >60 >60 >60    LIVER FUNCTION TESTS:  Recent Labs  01/15/17 1420 01/17/17 0407 01/19/17 0516 01/22/17 0553  BILITOT 1.1 0.7 0.3 0.6  AST ALT 17 13* 11* 18  ALKPHOS 240* 168* 116 139*  PROT 7.0 5.8* 5.7* 5.7*  ALBUMIN 2.2* 1.7* 1.6* 1.6*    Assessment and Plan:  abd abscesses post gastric bypass surgery 2 drains in place Draining well Will follow IR drain clinic can follow pt as OP if drains remain at DC  Electronically Signed: Jimmi Sidener A, PA-C 01/23/2017, 11:07 AM   I spent a total of 25 Minutes at the the patient's bedside AND on the patient's hospital floor or unit, greater than 50% of which was counseling/coordinating care for abd abscess drains

## 2017-01-23 NOTE — Progress Notes (Signed)
Pharmacy Antibiotic Note  Albert PetrinSteven T White is a 41 y.o. male admitted on 01/15/2017 with intra-abdominal infection.  Pharmacy has been consulted for cefepime dosing.  Plan: Start cefepime 2g IV Q8h Continue metronidazole 500mg  IV Q8h Monitor clinical picture, renal function F/U C&S, abx deescalation / LOT   Height: 5\' 10"  (177.8 cm) Weight: (!) 338 lb 1.6 oz (153.4 kg) IBW/kg (Calculated) : 73  Temp (24hrs), Avg:98.6 F (37 C), Min:98.5 F (36.9 C), Max:98.6 F (37 C)   Recent Labs Lab 01/17/17 0407  01/19/17 0516 01/20/17 1745 01/21/17 0455 01/22/17 0553 01/23/17 1248  WBC 5.8  --  6.2  --  6.0 5.3 6.9  CREATININE 0.66  < > 0.53* 0.59* 0.53* 0.53* 0.60*  < > = values in this interval not displayed.  Estimated Creatinine Clearance: 182.6 mL/min (A) (by C-G formula based on SCr of 0.6 mg/dL (L)).    No Known Allergies  Thank you for allowing pharmacy to be a part of this patient's care.  [name],[name] 01/23/2017 1:59 PM

## 2017-01-24 ENCOUNTER — Inpatient Hospital Stay (HOSPITAL_COMMUNITY): Payer: 59

## 2017-01-24 DIAGNOSIS — J90 Pleural effusion, not elsewhere classified: Secondary | ICD-10-CM

## 2017-01-24 LAB — GLUCOSE, CAPILLARY
GLUCOSE-CAPILLARY: 126 mg/dL — AB (ref 65–99)
Glucose-Capillary: 170 mg/dL — ABNORMAL HIGH (ref 65–99)
Glucose-Capillary: 159 mg/dL — ABNORMAL HIGH (ref 65–99)
Glucose-Capillary: 121 mg/dL — ABNORMAL HIGH (ref 65–99)
Glucose-Capillary: 127 mg/dL — ABNORMAL HIGH (ref 65–99)

## 2017-01-24 LAB — BASIC METABOLIC PANEL
ANION GAP: 7 (ref 5–15)
BUN: 9 mg/dL (ref 6–20)
CO2: 29 mmol/L (ref 22–32)
Calcium: 8.1 mg/dL — ABNORMAL LOW (ref 8.9–10.3)
Chloride: 97 mmol/L — ABNORMAL LOW (ref 101–111)
Creatinine, Ser: 0.48 mg/dL — ABNORMAL LOW (ref 0.61–1.24)
Glucose, Bld: 169 mg/dL — ABNORMAL HIGH (ref 65–99)
POTASSIUM: 3.8 mmol/L (ref 3.5–5.1)
Sodium: 133 mmol/L — ABNORMAL LOW (ref 135–145)

## 2017-01-24 LAB — MAGNESIUM: MAGNESIUM: 1.7 mg/dL (ref 1.7–2.4)

## 2017-01-24 LAB — PHOSPHORUS: Phosphorus: 3.9 mg/dL (ref 2.5–4.6)

## 2017-01-24 MED ORDER — FAT EMULSION 20 % IV EMUL
240.0000 mL | INTRAVENOUS | Status: AC
  Administered 2017-01-24: 240 mL via INTRAVENOUS
  Filled 2017-01-24: qty 250

## 2017-01-24 MED ORDER — TRACE MINERALS CR-CU-MN-SE-ZN 10-1000-500-60 MCG/ML IV SOLN
INTRAVENOUS | Status: AC
  Administered 2017-01-24: 18:00:00 via INTRAVENOUS
  Filled 2017-01-24: qty 2520

## 2017-01-24 MED ORDER — MAGNESIUM SULFATE 2 GM/50ML IV SOLN
2.0000 g | Freq: Once | INTRAVENOUS | Status: AC
Start: 2017-01-24 — End: 2017-01-24
  Administered 2017-01-24: 2 g via INTRAVENOUS
  Filled 2017-01-24: qty 50

## 2017-01-24 NOTE — Progress Notes (Signed)
Patient continues to refuse CPAP at night.

## 2017-01-24 NOTE — Progress Notes (Signed)
PHARMACY - ADULT TOTAL PARENTERAL NUTRITION CONSULT NOTE   Pharmacy Consult:  TPN Indication:  Prolonged NPO  Patient Measurements: Height: '5\' 10"'  (177.8 cm) Weight: (!) 333 lb 12.8 oz (151.4 kg) IBW/kg (Calculated) : 73 TPN AdjBW (KG): 93.4 Body mass index is 47.9 kg/m.  Assessment:  41 year old obese male s/p laparoscopic Roux-en-Y/gastric bypass surgery on 12/08/16 and hospitalization 8/14 through 01/10/17 for anasarca, abdominal pain, fever, and postoperative seroma with percutaneous drain placement now re-admitted with continued abdominal pain, occasional nausea, non-bloddy emesis, weight loss (on Lasix), and decreased appetite. CT revealed multiple large gas containing intra-abdominal abscesses concerning for dehiscence.   Patient reports drinking half of the Premier protein shakes but he will try to drink the entire amount.  His intake is minimal.  GI: anasarca/vol overload with perc drain placed 8/31 and 9/5, O/P 130m.  Prealbumin low at 5.3, LBM 9/5.  Pepcid in TPN, PRN Zofran, Anusol-HC for hemorrhoids *surgical G-tube eventually per Surgery, possible laparoscopy next week Endo: DM2 on metformin PTA - CBGs < 180 Insulin requirements in past 24 hrs: 16 units rSSI + 25 units in TPN Lytes: low Na/CL, Phos down to WNL, Mag 1.7, CoCa WNL *days of TPN without lytes: 9/7 Renal: SCr 0.48 stable - UOP 0.8 ml/kg/hr, NS 35 ml/hr Pulm: [place] >> RA (CPAP at home), s/p thoracentesis 9/7 Cards: HLD - VSS Hepatobil:  LFTs/ tbili / TG WN.  Alk Phos elevated at 139. Neuro: pain score 7-9, Robaxin Q8H, PRN Fentanyl/Dilaudid ID: Abx#[id number] - Cefepime/Flagyl for C.freundii peritonitis/intra-abd abscess.  Rare gardnerella/Strep constellatus in abdominal abscess cx.  Afebrile, WBC WNL  Zosyn 8/30 >> 9/7 Vanc 9/6 >> 9/7 Cefepime 9/7 >> Flagyl 9/7 >>  8/30 MRSA PCR - negative 8/30 BCx - negative 8/31 abd abscess cx - Gardnerella vaginalis, Strep constellatus 9/5 abscess cx - C.freundii (S cefepime,  Cipro, Primaxin, Septra) - holding for anaerobes  Best Practices: Lovenox 40 BID TPN Access: double lumen PICC placed 01/16/17 TPN start date: 01/16/17  Nutritional Goals (per RD recommendation 8/31): 2300-2500 kCal and 115-130 gm protein per day   Current Nutrition:  TPN Bariatric clear liquid diet (minimal intake) Premier Protein QID (received 1 yesterday = 160 calories, 30gm protein)   Plan:  - Continue Clinimix E 5/15 at 105 ml/hr (add back lytes today) + 20% ILE at 20 ml/hr x 12 hrs.  TPN provides 2269 kCal and 126gm protein per day, meeting ~100% of needs. - Daily multivitamin and trace elements in TPN - Continue resistant SSI Q4H + increase insulin in TPN to 30 units - Pepcid 445mdaily in TPN  - Mag sulfate 2gm IV x 1 - F/U pain control, iron supplementation when possible - F/U PO intake (Premier protein) and whether reducing TPN provision is indicated, labs on Monday   Tino Ronan D. DaMina MarblePharmD, BCPS Pager:  [phone]/12/2016, 11:40 AM

## 2017-01-24 NOTE — Progress Notes (Signed)
Subjective/Chief Complaint: Still with abdominal pain, but improving   Objective: Vital signs in last 24 hours: Temp:  [97.7 F (36.5 C)-98.2 F (36.8 C)] 97.7 F (36.5 C) (09/08 0418) Pulse Rate:  [90-99] 98 (09/08 0418) Resp:  [18] 18 (09/08 0418) BP: (130-140)/(78-86) 130/78 (09/08 0418) SpO2:  [99 %] 99 % (09/08 0418) Weight:  [151.4 kg (333 lb 12.8 oz)] 151.4 kg (333 lb 12.8 oz) (09/08 0418) Last BM Date: 01/22/17  Intake/Output from previous day: 09/07 0701 - 09/08 0700 In: 3326.7 [P.O.:900; I.V.:1876.7; IV Piggyback:540] Out: 2980 [Urine:2850; Drains:130] Intake/Output this shift: No intake/output data recorded.  Exam: Looks fairly comfortable Abdomen soft, obese, minimally tender Drain more serosang  Lab Results:   Recent Labs  01/22/17 0553 01/23/17 1248  WBC 5.3 6.9  HGB 8.9* 9.4*  HCT 29.3* 31.2*  PLT 352 401*   BMET  Recent Labs  01/23/17 1248 01/24/17 0501  NA 135 133*  K 4.2 3.8  CL 97* 97*  CO2 28 29  GLUCOSE 199* 169*  BUN 8 9  CREATININE 0.60* 0.48*  CALCIUM 8.6* 8.1*   PT/INR  Recent Labs  01/23/17 0723  LABPROT 14.3  INR 1.12   ABG No results for input(s): PHART, HCO3 in the last 72 hours.  Invalid input(s): PCO2, PO2  Studies/Results: Dg Chest 1 View  Result Date: 01/23/2017 CLINICAL DATA:  Status post left-sided thoracentesis EXAM: CHEST 1 VIEW COMPARISON:  January 22, 2017 FINDINGS: No pneumothorax. There remains moderate at least partially loculated pleural effusion on the left. There is a small pleural effusion on the right. There is bibasilar atelectasis. Heart is borderline enlarged with pulmonary venous hypertension. There is no frank edema or consolidation. Central catheter tip is in the superior vena cava. No adenopathy evident. No bone lesions. IMPRESSION: No pneumothorax following thoracentesis. Moderate pleural effusion, at least partially loculated, on the left remains. Small right pleural effusion present.  Bibasilar atelectasis. Underlying pulmonary vascular congestion. Electronically Signed   By: Bretta BangWilliam  Woodruff III M.D.   On: 01/23/2017 12:33   Dg Chest Left Decubitus  Result Date: 01/22/2017 CLINICAL DATA:  Shortness of breath.  Known left pleural effusion. EXAM: CHEST - LEFT DECUBITUS COMPARISON:  Portable chest x-ray of today's date. FINDINGS: There is a moderate size left pleural effusion. There is a free-flowing component of this E fusion. In addition, there is a small right pleural effusion which layers medially. IMPRESSION: Moderate-sized left pleural effusion with a free-flowing component. Small right pleural effusion with free-flowing component. Electronically Signed   By: David  SwazilandJordan M.D.   On: 01/22/2017 11:19   Dg Kayleen MemosUgi W/water Sol Cm  Result Date: 01/23/2017 CLINICAL DATA:  Inpatient. 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 Status post right lower quadrant percutaneous drain placement 01/16/2017. Status post left upper quadrant percutaneous drain placement 01/21/2017. EXAM: WATER SOLUBLE UPPER GI SERIES TECHNIQUE: Single-column upper GI series was performed using water soluble contrast. CONTRAST:  75 cc Isovue-300 oral contrast. COMPARISON:  01/19/2017 CT abdomen/pelvis. FLUOROSCOPY TIME:  Fluoroscopy Time:  3 minutes 42 seconds. Number of Acquired Spot Images: 15 FINDINGS: Percutaneous pigtail drainage catheter terminates in the medial left upper quadrant. Surgical sutures are also noted in the medial left upper quadrant. Esophagus is normal in distensibility with no gross esophageal filling defects or strictures. Patient is status post gastric bypass surgery. The small gastric pouch appears intact with no filling defects or ulcers. The gastrojejunostomy appears intact with no evidence of  anastomotic leak or significant narrowing at the anastomosis. Contrast passes into the normal caliber jejunal limb and proximal small  bowel loops without delay. No evidence of oral contrast in the excluded distal stomach . IMPRESSION: No evidence of anastomotic leak or stricture at the gastrojejunal anastomosis. Intact gastric pouch without appreciable complication. Electronically Signed   By: Delbert Phenix M.D.   On: 01/23/2017 12:02   Ir Thoracentesis Asp Pleural Space W/img Guide  Result Date: 01/23/2017 INDICATION: Status post gastric bypass surgery. Now with multiple intra-abdominal fluid collection that have been drained. Left pleural effusion noted on recent imaging with some dyspnea. Request is made for diagnostic and therapeutic thoracentesis. EXAM: ULTRASOUND GUIDED LEFT THORACENTESIS MEDICATIONS: 1% xylocaine COMPLICATIONS: None immediate. PROCEDURE: An ultrasound guided thoracentesis was thoroughly discussed with the patient and questions answered. The benefits, risks, alternatives and complications were also discussed. The patient understands and wishes to proceed with the procedure. Written consent was obtained. Ultrasound was performed to localize and mark an adequate pocket of fluid in the left chest. The area was then prepped and draped in the normal sterile fashion. 1% Lidocaine was used for local anesthesia. Under ultrasound guidance a Safe-T-Centesis catheter was introduced. Thoracentesis was performed. The catheter was removed and a dressing applied. FINDINGS: A total of approximately 25 cc of serosanguineous fluid was removed. Samples were sent to the laboratory as requested by the clinical team. The fluid appeared to have some complexity to it. The right side was imaged as well and no effusion was noted. IMPRESSION: Successful ultrasound guided left thoracentesis yielding 25 cc of pleural fluid. Read by: Barnetta Chapel, PA-C Electronically Signed   By: Malachy Moan M.D.   On: 01/23/2017 13:24    Anti-infectives: Anti-infectives    Start     Dose/Rate Route Frequency Ordered Stop   01/23/17 1500  metroNIDAZOLE  (FLAGYL) IVPB 500 mg     500 mg 100 mL/hr over 60 Minutes Intravenous Every 8 hours 01/23/17 1353     01/23/17 1500  ceFEPIme (MAXIPIME) 2 g in dextrose 5 % 50 mL IVPB     2 g 100 mL/hr over 30 Minutes Intravenous Every 8 hours 01/23/17 1357     01/22/17 2000  vancomycin (VANCOCIN) 1,250 mg in sodium chloride 0.9 % 250 mL IVPB  Status:  Discontinued     1,250 mg 166.7 mL/hr over 90 Minutes Intravenous Every 8 hours 01/22/17 1202 01/23/17 1358   01/22/17 1230  vancomycin (VANCOCIN) 2,500 mg in sodium chloride 0.9 % 500 mL IVPB     2,500 mg 250 mL/hr over 120 Minutes Intravenous  Once 01/22/17 1147 01/22/17 1438   01/15/17 1815  piperacillin-tazobactam (ZOSYN) IVPB 3.375 g  Status:  Discontinued     3.375 g 12.5 mL/hr over 240 Minutes Intravenous Every 8 hours 01/15/17 1804 01/15/17 1804   01/15/17 1630  piperacillin-tazobactam (ZOSYN) IVPB 3.375 g  Status:  Discontinued     3.375 g 12.5 mL/hr over 240 Minutes Intravenous Every 8 hours 01/15/17 1616 01/23/17 1354      Assessment/Plan:   LOS: 9 days   S/p lap RnY gastric bypass for morbid obesity complicated with presumed GJ anastomotic leak.  Upper GI yesterday shows no obvious leak  s/p  2 perc drain placements -continue drains -continue TPN -encourage ambulation -continue IV abx  Our bariatric surgeons are also follow and are considering laparoscopy next week depending on how he progresses   Albert White A 01/24/2017

## 2017-01-24 NOTE — Progress Notes (Signed)
Patient refused Bipap 9/7 

## 2017-01-24 NOTE — Progress Notes (Signed)
PROGRESS NOTE    Albert White  ZOX:[id number] DOB: [birth year] DOA: 01/15/2017 PCP: Alysia Penna, MD   Brief Narrative:  41 year old male who presented with abdominal pain. Is known to have Korinna Tat recent Roux-en-Y/gastric bypass on 12/08/16 in [place] by Dr. Franki Cabot, rehospitalized 12/30/16-01/10/17 for anasarca, abdominal pain, fever of 103F and postoperative seroma for which Mubashir Mallek percutaneous drain was placed. Also has Tyshell Ramberg diagnosis of type 2 diabetes mellitus, dyslipidemia and obstructive sleep apnea. After his discharge patient complained of persistent abdominal pain, associated with nausea, emesis and decreased appetite, outpatient CT of the abdomen showedgas containing 10.7 x 5 cm abscess abutting the posterior margin of the GJ anastomosis worrisome for dehiscence, diffuse peritonitis with large gas containing abscess throughout the peritoneal cavity including Kalynn Declercq right perihepatic abscess compressing the right liver lobe and provoking anileus, findings that prompted rehospitalization.   Patient was admitted to the hospital with the working diagnosis of abdominal sepsis due to peritonitis, and intra-abdominal abscess related to Angela Platner postoperative complication of Roux-en-Y/gastric bypass (12/08/16).   Assessment & Plan:   Active Problems:   Postprocedural intraabdominal abscess   1. Sepsis, present on admission due to peritonitis and intra-abdominal abscess.  Antibiotic therapy with IV flagyl and cefepime (9/7 - ) (zosyn 8/30 - 9/7 -- vanc 9/6- 9/7 ).  Percutaneous drain in place (8/31) with purulent drainage in RLQ (80 cc in past 24) Epigastric drain placed (9/5) with serosanguinous drainage. (50 cc in past 24) 8/31 cx with rare gardenella vaginalis and rare strep constellatus 9/5 cx with few viridans strep and citrobacter freundii Bcx 8/30 NGTDx5days Follow up ct with decreased in size of intra-abdominal collections, but positive findings cw dehiscence/anastomotic leak, at the gastro  jejunal anastomosis. UGI study without evidence of leak, trialing bariatric clear liquid diet Possible laparoscopy next week per surgery  2. Thickening of Ascending colon/hepatic flexure concerning for colitis vs reactive inflammation:  Currently on flagyl and cefepime, may be related to peritonitis/intrabdominal abscess above.  Had diarrhea, but resolved.  CTM.    3. L greater than R pleural effusions:  S/p drainage of 25 cc 9/7.  Exudative, CXR with concern for loculation.  Discussed with pulm who rec chest CT and rec c/s thoracic.  Discussed with thoracic as well who are planning to see him.    2. Type 2 diabetes mellitus. Glucose cover and monitoring with sliding scale insulin, continue parenteral nutrition (receiving insulin with TPN as well).   3. Obstructive sleep apnea. Cpap,  oxymetry monitoring and as needed supplemental 02 per , to keep 02 saturation above 92%, continue incentive spirometer.   4. Chronic multifactorial anemia. Stable. No signs of active bleeding.  5. Morbid obesity. SP bariatric surgery, high risk for inpatient complications  6. TPN per pharmacy  DVT prophylaxis: lovenox Code Status: full  Family Communication: wife on phone Disposition Plan: pending further improvement   Consultants:   Surgery  IR  pharmacy  Procedures: (Don't include imaging studies which can be auto populated. Include things that cannot be auto populated i.e. Echo, Carotid and venous dopplers, Foley, Bipap, HD, tubes/drains, wound vac, central lines etc)  Perc drain placement  Antimicrobials: (specify start and planned stop date. Auto populated tables are space occupying and do not give end dates)  zosyn    Subjective: Abdominal pain in RLQ seems better, but midepigastric pain still present.    Objective: Vitals:   01/23/17 0601 01/23/17 1415 01/23/17 2019 01/24/17 0418  BP: 138/85 136/86 140/83 130/78  Pulse: (!) 101  99 90 98  Resp:  18 18 18   Temp:  97.9 F  (36.6 C) 98.2 F (36.8 C) 97.7 F (36.5 C)  TempSrc:  Oral Oral Oral  SpO2:  99% 99% 99%  Weight:    (!) 151.4 kg (333 lb 12.8 oz)  Height:        Intake/Output Summary (Last 24 hours) at 01/24/17 0856 Last data filed at 01/24/17 [id number]  Gross per 24 hour  Intake          3326.67 ml  Output             2980 ml  Net           346.67 ml   Filed Weights   01/22/17 0500 01/23/17 0443 01/24/17 0418  Weight: (!) 155.9 kg (343 lb 12.8 oz) (!) 153.4 kg (338 lb 1.6 oz) (!) 151.4 kg (333 lb 12.8 oz)    Examination:  General: No acute distress. Cardiovascular: Heart sounds show Bernerd Terhune regular rate, and rhythm. No gallops or rubs. No murmurs. No JVD. Lungs: Clear to auscultation bilaterally with good air movement. No rales, rhonchi or wheezes. Abdomen: Soft, midly tender diffusely, Drains in RLQ and epigastric region.  No masses. No hepatosplenomegaly. Neurological: Alert and oriented 3. Moves all extremities 4 with equal strength. Cranial nerves II through XII grossly intact. Skin: Warm and dry. No rashes or lesions. Extremities: No clubbing or cyanosis. No edema. Pedal pulses 2+. Psychiatric: Mood and affect are normal. Insight and judgment are appropriate.  Data Reviewed: I have personally reviewed following labs and imaging studies  CBC:  Recent Labs Lab 01/19/17 0516 01/21/17 0455 01/22/17 0553 01/23/17 1248  WBC 6.2 6.0 5.3 6.9  NEUTROABS 4.3  --   --   --   HGB 9.5* 8.9* 8.9* 9.4*  HCT 32.0* 30.1* 29.3* 31.2*  MCV 86.5 86.2 85.4 85.2  PLT 362 332 352 401*   Basic Metabolic Panel:  Recent Labs Lab 01/19/17 0516 01/20/17 1745 01/21/17 0455 01/22/17 0553 01/23/17 1248 01/24/17 0501  NA 135 125* 133* 134* 135 133*  K 3.8 6.1* 4.1 4.1 4.2 3.8  CL 99* 93* 97* 98* 97* 97*  CO2 30 21* 28 29 28 29   GLUCOSE 163* 795* 164* 161* 199* 169*  BUN 6 7 5* 5* 8 9  CREATININE 0.53* 0.59* 0.53* 0.53* 0.60* 0.48*  CALCIUM 7.9* 7.8* 7.9* 8.1* 8.6* 8.1*  MG 1.7 2.0 1.8 1.7  --  1.7   PHOS 4.6 6.9* 5.0* 5.3*  --  3.9   GFR: Estimated Creatinine Clearance: 181.3 mL/min (Yuval Rubens) (by C-G formula based on SCr of 0.48 mg/dL (L)). Liver Function Tests:  Recent Labs Lab 01/19/17 0516 01/22/17 0553 01/23/17 1248  AST 19 30 27   ALT 11* 18 19  ALKPHOS 116 139* 175*  BILITOT 0.3 0.6 0.8  PROT 5.7* 5.7* 6.7  ALBUMIN 1.6* 1.6* 1.9*   No results for input(s): LIPASE, AMYLASE in the last 168 hours. No results for input(s): AMMONIA in the last 168 hours. Coagulation Profile:  Recent Labs Lab 01/20/17 1744 01/23/17 0723  INR 1.19 1.12   Cardiac Enzymes: No results for input(s): CKTOTAL, CKMB, CKMBINDEX, TROPONINI in the last 168 hours. BNP (last 3 results) No results for input(s): PROBNP in the last 8760 hours. HbA1C: No results for input(s): HGBA1C in the last 72 hours. CBG:  Recent Labs Lab 01/23/17 1615 01/23/17 2006 01/23/17 2339 01/24/17 0411 01/24/17 0749  GLUCAP 125* 133* 145* 170* 159*   Lipid Profile:  No results for input(s): CHOL, HDL, LDLCALC, TRIG, CHOLHDL, LDLDIRECT in the last 72 hours. Thyroid Function Tests: No results for input(s): TSH, T4TOTAL, FREET4, T3FREE, THYROIDAB in the last 72 hours. Anemia Panel:  Recent Labs  01/21/17 2025  VITAMINB12 809  FOLATE 7.5  FERRITIN 315  TIBC 119*  IRON 15*   Sepsis Labs: No results for input(s): PROCALCITON, LATICACIDVEN in the last 168 hours.  Recent Results (from the past 240 hour(s))  Culture, blood (Routine x 2)     Status: None   Collection Time: 01/15/17  2:20 PM  Result Value Ref Range Status   Specimen Description BLOOD LEFT HAND  Final   Special Requests   Final    BOTTLES DRAWN AEROBIC AND ANAEROBIC Blood Culture adequate volume   Culture NO GROWTH 5 DAYS  Final   Report Status 01/20/2017 FINAL  Final  Culture, blood (Routine x 2)     Status: None   Collection Time: 01/15/17  3:25 PM  Result Value Ref Range Status   Specimen Description BLOOD RIGHT HAND  Final   Special  Requests   Final    BOTTLES DRAWN AEROBIC AND ANAEROBIC Blood Culture adequate volume   Culture NO GROWTH 5 DAYS  Final   Report Status 01/20/2017 FINAL  Final  MRSA PCR Screening     Status: None   Collection Time: 01/15/17 11:04 PM  Result Value Ref Range Status   MRSA by PCR NEGATIVE NEGATIVE Final    Comment:        The GeneXpert MRSA Assay (FDA approved for NASAL specimens only), is one component of Corazon Nickolas comprehensive MRSA colonization surveillance program. It is not intended to diagnose MRSA infection nor to guide or monitor treatment for MRSA infections.   Aerobic/Anaerobic Culture (surgical/deep wound)     Status: None   Collection Time: 01/16/17  2:21 PM  Result Value Ref Range Status   Specimen Description ABSCESS ABDOMEN  Final   Special Requests INTRA ABDOMINAL ABSCESS  Final   Gram Stain   Final    ABUNDANT WBC PRESENT, PREDOMINANTLY PMN NO ORGANISMS SEEN    Culture   Final    RARE GARDNERELLA VAGINALIS Standardized susceptibility testing for this organism is not available. RARE STREPTOCOCCUS CONSTELLATUS CALL MICROBIOLOGY LAB IF SENSITIVITIES ARE REQUIRED.    Report Status 01/20/2017 FINAL  Final  Aerobic/Anaerobic Culture (surgical/deep wound)     Status: None (Preliminary result)   Collection Time: 01/21/17 10:04 AM  Result Value Ref Range Status   Specimen Description ABSCESS  Final   Special Requests DRAIN POST CT GUIDED PLACEMENT  Final   Gram Stain   Final    ABUNDANT WBC PRESENT,BOTH PMN AND MONONUCLEAR ABUNDANT GRAM POSITIVE COCCI IN PAIRS ABUNDANT GRAM VARIABLE ROD    Culture   Final    ABUNDANT CITROBACTER FREUNDII FEW VIRIDANS STREPTOCOCCUS CALL MICROBIOLOGY LAB IF SENSITIVITIES ARE REQUIRED. HOLDING FOR POSSIBLE ANAEROBE    Report Status PENDING  Incomplete   Organism ID, Bacteria CITROBACTER FREUNDII  Final      Susceptibility   Citrobacter freundii - MIC*    CEFAZOLIN >=64 RESISTANT Resistant     CEFEPIME <=1 SENSITIVE Sensitive      CEFTAZIDIME >=64 RESISTANT Resistant     CEFTRIAXONE 16 INTERMEDIATE Intermediate     CIPROFLOXACIN <=0.25 SENSITIVE Sensitive     GENTAMICIN <=1 SENSITIVE Sensitive     IMIPENEM <=0.25 SENSITIVE Sensitive     TRIMETH/SULFA <=20 SENSITIVE Sensitive     PIP/TAZO 64 INTERMEDIATE Intermediate     *  ABUNDANT CITROBACTER FREUNDII  Gram stain     Status: None   Collection Time: 01/23/17 12:53 PM  Result Value Ref Range Status   Specimen Description PLEURAL LEFT  Final   Special Requests NONE  Final   Gram Stain NO WBC SEEN NO ORGANISMS SEEN   Final   Report Status 01/23/2017 FINAL  Final         Radiology Studies: Dg Chest 1 View  Result Date: 01/23/2017 CLINICAL DATA:  Status post left-sided thoracentesis EXAM: CHEST 1 VIEW COMPARISON:  January 22, 2017 FINDINGS: No pneumothorax. There remains moderate at least partially loculated pleural effusion on the left. There is Jamell Opfer small pleural effusion on the right. There is bibasilar atelectasis. Heart is borderline enlarged with pulmonary venous hypertension. There is no frank edema or consolidation. Central catheter tip is in the superior vena cava. No adenopathy evident. No bone lesions. IMPRESSION: No pneumothorax following thoracentesis. Moderate pleural effusion, at least partially loculated, on the left remains. Small right pleural effusion present. Bibasilar atelectasis. Underlying pulmonary vascular congestion. Electronically Signed   By: Bretta Bang III M.D.   On: 01/23/2017 12:33   Dg Chest Left Decubitus  Result Date: 01/22/2017 CLINICAL DATA:  Shortness of breath.  Known left pleural effusion. EXAM: CHEST - LEFT DECUBITUS COMPARISON:  Portable chest x-ray of today's date. FINDINGS: There is Arvis Zwahlen moderate size left pleural effusion. There is Saliou Barnier free-flowing component of this E fusion. In addition, there is Shatora Weatherbee small right pleural effusion which layers medially. IMPRESSION: Moderate-sized left pleural effusion with Collyn Ribas free-flowing  component. Small right pleural effusion with free-flowing component. Electronically Signed   By: David  [place]iland M.D.   On: 01/22/2017 11:19   Dg Kayleen Memos W/water Sol Cm  Result Date: 01/23/2017 CLINICAL DATA:  Inpatient. 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 Status post right lower quadrant percutaneous drain placement 01/16/2017. Status post left upper quadrant percutaneous drain placement 01/21/2017. EXAM: WATER SOLUBLE UPPER GI SERIES TECHNIQUE: Single-column upper GI series was performed using water soluble contrast. CONTRAST:  75 cc Isovue-300 oral contrast. COMPARISON:  01/19/2017 CT abdomen/pelvis. FLUOROSCOPY TIME:  Fluoroscopy Time:  3 minutes 42 seconds. Number of Acquired Spot Images: 15 FINDINGS: Percutaneous pigtail drainage catheter terminates in the medial left upper quadrant. Surgical sutures are also noted in the medial left upper quadrant. Esophagus is normal in distensibility with no gross esophageal filling defects or strictures. Patient is status post gastric bypass surgery. The small gastric pouch appears intact with no filling defects or ulcers. The gastrojejunostomy appears intact with no evidence of anastomotic leak or significant narrowing at the anastomosis. Contrast passes into the normal caliber jejunal limb and proximal small bowel loops without delay. No evidence of oral contrast in the excluded distal stomach . IMPRESSION: No evidence of anastomotic leak or stricture at the gastrojejunal anastomosis. Intact gastric pouch without appreciable complication. Electronically Signed   By: Delbert Phenix M.D.   On: 01/23/2017 12:02   Ir Thoracentesis Asp Pleural Space W/img Guide  Result Date: 01/23/2017 INDICATION: Status post gastric bypass surgery. Now with multiple intra-abdominal fluid collection that have been drained. Left pleural effusion noted on recent imaging with some dyspnea. Request is made for diagnostic  and therapeutic thoracentesis. EXAM: ULTRASOUND GUIDED LEFT THORACENTESIS MEDICATIONS: 1% xylocaine COMPLICATIONS: None immediate. PROCEDURE: An ultrasound guided thoracentesis was thoroughly discussed with the patient and questions answered. The benefits, risks, alternatives and complications were also discussed. The patient understands and wishes to proceed  with the procedure. Written consent was obtained. Ultrasound was performed to localize and mark an adequate pocket of fluid in the left chest. The area was then prepped and draped in the normal sterile fashion. 1% Lidocaine was used for local anesthesia. Under ultrasound guidance Solstice Lastinger Safe-T-Centesis catheter was introduced. Thoracentesis was performed. The catheter was removed and Charisma Charlot dressing applied. FINDINGS: Sheniqua Carolan total of approximately 25 cc of serosanguineous fluid was removed. Samples were sent to the laboratory as requested by the clinical team. The fluid appeared to have some complexity to it. The right side was imaged as well and no effusion was noted. IMPRESSION: Successful ultrasound guided left thoracentesis yielding 25 cc of pleural fluid. Read by: Barnetta Chapel, PA-C Electronically Signed   By: Malachy Moan M.D.   On: 01/23/2017 13:24        Scheduled Meds: . chlorhexidine  15 mL Mouth Rinse BID  . Chlorhexidine Gluconate Cloth  6 each Topical Daily  . enoxaparin (LOVENOX) injection  40 mg Subcutaneous Q12H  . hydrocortisone   Rectal BID  . insulin aspart  0-20 Units Subcutaneous Q4H  . mouth rinse  15 mL Mouth Rinse q12n4p  . pneumococcal 23 valent vaccine  0.5 mL Intramuscular Tomorrow-1000  . protein supplement shake  2 oz Oral QID  . sodium chloride flush  5 mL Intravenous Q8H  . sodium chloride flush  5 mL Intravenous Q8H   Continuous Infusions: . sodium chloride 35 mL/hr at 01/23/17 0608  . ceFEPime (MAXIPIME) IV 2 g (01/24/17 0821)  . methocarbamol (ROBAXIN)  IV Stopped (01/24/17 0602)  . metronidazole Stopped (01/24/17  0720)  . TPN (CLINIMIX) Adult without lytes 105 mL/hr at 01/23/17 1803     LOS: 9 days    Time spent: 35 min    Daniele Yankowski Grier Mitts, MD [facility] [phone]   If 7PM-7AM, please contact night-coverage [email or web address] Password [email or web address] 01/24/2017, 8:56 AM

## 2017-01-24 NOTE — Consult Note (Signed)
Reason for Consult:left pleural effusion Referring Physician: [facility]  Albert White is an 41 y.o. male.  HPI: 41 yo man with a past history of morbid obesity, diabetes, vertigo and hyperlipidemia. Underwent a gastric bypass in July. Hospitalized from 8/14 to 8/25 with fever and abdominal pain. Saw PCP 8/29. Abdominal CT showed a large abscess. Admitted with ongoing abdominal pain and fever on 8/30. Percutaneous drain placed 8/31. Treated with TPN and antibiotics. Had another drain placed 9/5.  Had bilateral pleural effusions noted on CT 8/30, L>R. CXR 9/7 showed left effusion had increased in size and tracked up the chest wall laterally. CT done this evening confirmed the findings on CXR. Thoracentesis attempted yesterday only yielded 25 ml of fluid.  Currently c/o abdominal pain. Short of breath with any activity.  Past Medical History:  Diagnosis Date  . Abscess   . Diabetes mellitus without complication ([name])   . Hidradenitis suppurativa   . Hyperlipemia   . Morbid obesity ([place])   . Vertigo     Past Surgical History:  Procedure Laterality Date  . INCISION AND DRAINAGE ABSCESS Right 12/15/2013   Procedure: INCISION AND DRAINAGE Right Posterior neck ABSCESS;  Surgeon: Gwenyth Ober, MD;  Location: [facility];  Service: General;  Laterality: Right;  . INCISION AND DRAINAGE DEEP NECK ABSCESS     Hidradenitis - 3-4 times at [facility] and [place]  . IR THORACENTESIS ASP PLEURAL SPACE W/IMG GUIDE  01/23/2017    Family History  Problem Relation Age of Onset  . Arthritis Mother   . Hypertension Mother     Social History:  reports that he quit smoking about 3 years ago. He smoked 1.00 pack per day. He has never used smokeless tobacco. He reports that he does not drink alcohol or use drugs.  Allergies: No Known Allergies  Medications:  Scheduled: . chlorhexidine  15 mL Mouth Rinse BID  . Chlorhexidine Gluconate Cloth  6 each Topical Daily  . enoxaparin (LOVENOX) injection  40 mg Subcutaneous  Q12H  . hydrocortisone   Rectal BID  . insulin aspart  0-20 Units Subcutaneous Q4H  . mouth rinse  15 mL Mouth Rinse q12n4p  . pneumococcal 23 valent vaccine  0.5 mL Intramuscular Tomorrow-1000  . protein supplement shake  2 oz Oral QID  . sodium chloride flush  5 mL Intravenous Q8H  . sodium chloride flush  5 mL Intravenous Q8H    Results for orders placed or performed during the hospital encounter of 01/15/17 (from the past 48 hour(s))  Glucose, capillary     Status: Abnormal   Collection Time: 01/22/17 11:36 PM  Result Value Ref Range   Glucose-Capillary 162 (H) 65 - 99 mg/dL  Glucose, capillary     Status: Abnormal   Collection Time: 01/23/17  3:50 AM  Result Value Ref Range   Glucose-Capillary 143 (H) 65 - 99 mg/dL  Protime-INR     Status: None   Collection Time: 01/23/17  7:23 AM  Result Value Ref Range   Prothrombin Time 14.3 11.4 - 15.2 seconds   INR 1.12   Glucose, capillary     Status: Abnormal   Collection Time: 01/23/17  7:37 AM  Result Value Ref Range   Glucose-Capillary 145 (H) 65 - 99 mg/dL  Comprehensive metabolic panel     Status: Abnormal   Collection Time: 01/23/17 12:48 PM  Result Value Ref Range   Sodium 135 135 - 145 mmol/L   Potassium 4.2 3.5 - 5.1 mmol/L  Chloride 97 (L) 101 - 111 mmol/L   CO2 28 22 - 32 mmol/L   Glucose, Bld 199 (H) 65 - 99 mg/dL   BUN 8 6 - 20 mg/dL   Creatinine, Ser 0.60 (L) 0.61 - 1.24 mg/dL   Calcium 8.6 (L) 8.9 - 10.3 mg/dL   Total Protein 6.7 6.5 - 8.1 g/dL   Albumin 1.9 (L) 3.5 - 5.0 g/dL   AST 27 15 - 41 U/L   ALT 19 17 - 63 U/L   Alkaline Phosphatase 175 (H) 38 - 126 U/L   Total Bilirubin 0.8 0.3 - 1.2 mg/dL   GFR calc non Af Amer >60 >60 mL/min   GFR calc Af Amer >60 >60 mL/min    Comment: (NOTE) The eGFR has been calculated using the CKD EPI equation. This calculation has not been validated in all clinical situations. eGFR's persistently <60 mL/min signify possible Chronic Kidney Disease.    Anion gap 10 5 - 15   CBC     Status: Abnormal   Collection Time: 01/23/17 12:48 PM  Result Value Ref Range   WBC 6.9 4.0 - 10.5 K/uL   RBC 3.66 (L) 4.22 - 5.81 MIL/uL   Hemoglobin 9.4 (L) 13.0 - 17.0 g/dL   HCT 31.2 (L) 39.0 - 52.0 %   MCV 85.2 78.0 - 100.0 fL   MCH 25.7 (L) 26.0 - 34.0 pg   MCHC 30.1 30.0 - 36.0 g/dL   RDW 15.4 11.5 - 15.5 %   Platelets 401 (H) 150 - 400 K/uL  Lactate dehydrogenase     Status: None   Collection Time: 01/23/17 12:48 PM  Result Value Ref Range   LDH 152 98 - 192 U/L  Lactate dehydrogenase (pleural or peritoneal fluid)     Status: Abnormal   Collection Time: 01/23/17 12:53 PM  Result Value Ref Range   LD, Fluid 453 (H) 3 - 23 U/L    Comment: (NOTE) Results should be evaluated in conjunction with serum values    Fluid Type-FLDH Pleural, L   Body fluid cell count with differential     Status: Abnormal   Collection Time: 01/23/17 12:53 PM  Result Value Ref Range   Fluid Type-FCT Pleural, L    Color, Fluid RED (A) YELLOW   Appearance, Fluid CLOUDY (A) CLEAR   WBC, Fluid 3,750 (H) 0 - 1,000 cu mm   Neutrophil Count, Fluid 54 (H) 0 - 25 %   Lymphs, Fluid 43 %   Monocyte-Macrophage-Serous Fluid 3 (L) 50 - 90 %   Eos, Fluid 0 %  Gram stain     Status: None   Collection Time: 01/23/17 12:53 PM  Result Value Ref Range   Specimen Description PLEURAL LEFT    Special Requests NONE    Gram Stain NO WBC SEEN NO ORGANISMS SEEN     Report Status 01/23/2017 FINAL   Amylase, pleural or peritoneal fluid     Status: None   Collection Time: 01/23/17 12:53 PM  Result Value Ref Range   Amylase, Fluid 17 U/L    Comment: NO NORMAL RANGE ESTABLISHED FOR THIS TEST   Fluid Type-FAMY Pleural, L   Albumin, pleural or peritoneal fluid     Status: None   Collection Time: 01/23/17 12:53 PM  Result Value Ref Range   Albumin, Fluid 1.4 g/dL    Comment: (NOTE) No normal range established for this test Results should be evaluated in conjunction with serum values    Fluid  Type-FALB Pleural,  L   Protein, pleural or peritoneal fluid     Status: None   Collection Time: 01/23/17 12:53 PM  Result Value Ref Range   Total protein, fluid 3.8 g/dL    Comment: (NOTE) No normal range established for this test Results should be evaluated in conjunction with serum values    Fluid Type-FTP Pleural, L   Glucose, pleural or peritoneal fluid     Status: None   Collection Time: 01/23/17 12:53 PM  Result Value Ref Range   Glucose, Fluid 130 mg/dL    Comment: (NOTE) No normal range established for this test Results should be evaluated in conjunction with serum values    Fluid Type-FGLU Pleural, L   Culture, body fluid-bottle     Status: None (Preliminary result)   Collection Time: 01/23/17 12:53 PM  Result Value Ref Range   Specimen Description PLEURAL LEFT    Special Requests NONE    Culture NO GROWTH < 24 HOURS    Report Status PENDING   Glucose, capillary     Status: Abnormal   Collection Time: 01/23/17  1:18 PM  Result Value Ref Range   Glucose-Capillary 169 (H) 65 - 99 mg/dL  Glucose, capillary     Status: Abnormal   Collection Time: 01/23/17  4:15 PM  Result Value Ref Range   Glucose-Capillary 125 (H) 65 - 99 mg/dL  Glucose, capillary     Status: Abnormal   Collection Time: 01/23/17  8:06 PM  Result Value Ref Range   Glucose-Capillary 133 (H) 65 - 99 mg/dL  Glucose, capillary     Status: Abnormal   Collection Time: 01/23/17 11:39 PM  Result Value Ref Range   Glucose-Capillary 145 (H) 65 - 99 mg/dL  Glucose, capillary     Status: Abnormal   Collection Time: 01/24/17  4:11 AM  Result Value Ref Range   Glucose-Capillary 170 (H) 65 - 99 mg/dL  Basic metabolic panel     Status: Abnormal   Collection Time: 01/24/17  5:01 AM  Result Value Ref Range   Sodium 133 (L) 135 - 145 mmol/L   Potassium 3.8 3.5 - 5.1 mmol/L   Chloride 97 (L) 101 - 111 mmol/L   CO2 29 22 - 32 mmol/L   Glucose, Bld 169 (H) 65 - 99 mg/dL   BUN 9 6 - 20 mg/dL   Creatinine, Ser  0.48 (L) 0.61 - 1.24 mg/dL   Calcium 8.1 (L) 8.9 - 10.3 mg/dL   GFR calc non Af Amer >60 >60 mL/min   GFR calc Af Amer >60 >60 mL/min    Comment: (NOTE) The eGFR has been calculated using the CKD EPI equation. This calculation has not been validated in all clinical situations. eGFR's persistently <60 mL/min signify possible Chronic Kidney Disease.    Anion gap 7 5 - 15  Phosphorus     Status: None   Collection Time: 01/24/17  5:01 AM  Result Value Ref Range   Phosphorus 3.9 2.5 - 4.6 mg/dL  Magnesium     Status: None   Collection Time: 01/24/17  5:01 AM  Result Value Ref Range   Magnesium 1.7 1.7 - 2.4 mg/dL  Glucose, capillary     Status: Abnormal   Collection Time: 01/24/17  7:49 AM  Result Value Ref Range   Glucose-Capillary 159 (H) 65 - 99 mg/dL  Glucose, capillary     Status: Abnormal   Collection Time: 01/24/17 12:18 PM  Result Value Ref Range   Glucose-Capillary 126 (H) 65 -  99 mg/dL  Glucose, capillary     Status: Abnormal   Collection Time: 01/24/17  3:58 PM  Result Value Ref Range   Glucose-Capillary 121 (H) 65 - 99 mg/dL    Dg Chest 1 View  Result Date: 01/23/2017 CLINICAL DATA:  Status post left-sided thoracentesis EXAM: CHEST 1 VIEW COMPARISON:  January 22, 2017 FINDINGS: No pneumothorax. There remains moderate at least partially loculated pleural effusion on the left. There is a small pleural effusion on the right. There is bibasilar atelectasis. Heart is borderline enlarged with pulmonary venous hypertension. There is no frank edema or consolidation. Central catheter tip is in the superior vena cava. No adenopathy evident. No bone lesions. IMPRESSION: No pneumothorax following thoracentesis. Moderate pleural effusion, at least partially loculated, on the left remains. Small right pleural effusion present. Bibasilar atelectasis. Underlying pulmonary vascular congestion. Electronically Signed   By: Lowella Grip III M.D.   On: 01/23/2017 12:33   Ct Chest Wo  Contrast  Result Date: 01/24/2017 CLINICAL DATA:  Left pleural effusion status post thoracentesis EXAM: CT CHEST WITHOUT CONTRAST TECHNIQUE: Multidetector CT imaging of the chest was performed following the standard protocol without IV contrast. COMPARISON:  Radiograph 01/23/2017, CT abdomen pelvis 01/19/2017, 01/15/2017 FINDINGS: Cardiovascular: Limited evaluation without intravenous contrast. Non aneurysmal aorta. Right-sided central venous catheter tip is at the brachiocephalic confluence. Normal heart size. No significant pericardial effusion Mediastinum/Nodes: Midline trachea. No thyroid mass. No significantly enlarged mediastinal lymph nodes. Limited assessment of hilar adenopathy. Esophagus within normal limits. Lungs/Pleura: Partial lower lobe consolidations may reflect atelectasis or pneumonia. Moderate left pleural effusion with some loculation anteriorly. Small right-sided pleural effusion. Negative for a pneumothorax Upper Abdomen: Interval placement of pigtail drainage catheter adjacent to the bypassed stomach with decreased abscess collection since previous CT. Grossly stable perisplenic fluid collection measuring approximately 11.3 x 2.5 cm on axial images. Interval increase in a right perihepatic fluid collection, this measures 15 cm x 6.8 cm by 16.3 cm. Mass effect on the underlying liver. Musculoskeletal: No acute or suspicious bone lesion IMPRESSION: 1. Small right pleural effusion and moderate left pleural effusion with some loculation of the pleural fluid on the left. Negative for a pneumothorax. Partial consolidations in the lower lobes may reflect atelectasis or pneumonia 2. Interim placement of a pigtail drainage catheter adjacent to the gastric sutures/gastro jejunal anastomosis with resolution of previously noted gas and fluid collection 3. Interval increase in size of a right perihepatic fluid collection since the prior CT, measuring at least 16.3 cm. 4. Grossly stable left perisplenic  fluid collection Electronically Signed   By: Donavan Foil M.D.   On: 01/24/2017 19:52   Dg Duanne Limerick W/water Sol Cm  Result Date: 01/23/2017 CLINICAL DATA:  Inpatient. Status post Roux-en-Y gastric bypass surgery 46/96/2952 complicated by multiple left intra peritoneal abscesses. Status post right lower quadrant percutaneous drain placement 01/16/2017. Status post left upper quadrant percutaneous drain placement 01/21/2017. EXAM: WATER SOLUBLE UPPER GI SERIES TECHNIQUE: Single-column upper GI series was performed using water soluble contrast. CONTRAST:  75 cc Isovue-300 oral contrast. COMPARISON:  01/19/2017 CT abdomen/pelvis. FLUOROSCOPY TIME:  Fluoroscopy Time:  3 minutes 42 seconds. Number of Acquired Spot Images: 15 FINDINGS: Percutaneous pigtail drainage catheter terminates in the medial left upper quadrant. Surgical sutures are also noted in the medial left upper quadrant. Esophagus is normal in distensibility with no gross esophageal filling defects or strictures. Patient is status post gastric bypass surgery. The small gastric pouch appears intact with no filling defects or ulcers. The gastrojejunostomy  appears intact with no evidence of anastomotic leak or significant narrowing at the anastomosis. Contrast passes into the normal caliber jejunal limb and proximal small bowel loops without delay. No evidence of oral contrast in the excluded distal stomach . IMPRESSION: No evidence of anastomotic leak or stricture at the gastrojejunal anastomosis. Intact gastric pouch without appreciable complication. Electronically Signed   By: Ilona Sorrel M.D.   On: 01/23/2017 12:02   Ir Thoracentesis Asp Pleural Space W/img Guide  Result Date: 01/23/2017 INDICATION: Status post gastric bypass surgery. Now with multiple intra-abdominal fluid collection that have been drained. Left pleural effusion noted on recent imaging with some dyspnea. Request is made for diagnostic and therapeutic thoracentesis. EXAM: ULTRASOUND  GUIDED LEFT THORACENTESIS MEDICATIONS: 1% xylocaine COMPLICATIONS: None immediate. PROCEDURE: An ultrasound guided thoracentesis was thoroughly discussed with the patient and questions answered. The benefits, risks, alternatives and complications were also discussed. The patient understands and wishes to proceed with the procedure. Written consent was obtained. Ultrasound was performed to localize and mark an adequate pocket of fluid in the left chest. The area was then prepped and draped in the normal sterile fashion. 1% Lidocaine was used for local anesthesia. Under ultrasound guidance a Safe-T-Centesis catheter was introduced. Thoracentesis was performed. The catheter was removed and a dressing applied. FINDINGS: A total of approximately 25 cc of serosanguineous fluid was removed. Samples were sent to the laboratory as requested by the clinical team. The fluid appeared to have some complexity to it. The right side was imaged as well and no effusion was noted. IMPRESSION: Successful ultrasound guided left thoracentesis yielding 25 cc of pleural fluid. Read by: Saverio Danker, PA-C Electronically Signed   By: Jacqulynn Cadet M.D.   On: 01/23/2017 13:24    Review of Systems  Constitutional: Positive for chills, fever and malaise/fatigue.  Respiratory: Positive for shortness of breath.   Gastrointestinal: Positive for diarrhea.       Abdominal pain  Neurological: Negative for focal weakness and seizures.   Blood pressure (!) 151/82, pulse 91, temperature 97.7 F (36.5 C), temperature source Oral, resp. rate 18, height '5\' 10"'  (1.778 m), weight (!) 333 lb 12.8 oz (151.4 kg), SpO2 99 %. Physical Exam  Vitals reviewed. Constitutional: He is oriented to person, place, and time.  obese  HENT:  Head: Normocephalic and atraumatic.  Mouth/Throat: No oropharyngeal exudate.  Eyes: Conjunctivae and EOM are normal. No scleral icterus.  Neck: Neck supple. No thyromegaly present.  Cardiovascular: Normal rate,  regular rhythm and normal heart sounds.   Respiratory: Effort normal. He has no wheezes. He has no rales.  Absent BS left base  GI: There is tenderness.  Drains in place  Musculoskeletal: He exhibits no edema.  Lymphadenopathy:    He has no cervical adenopathy.  Neurological: He is alert and oriented to person, place, and time. No cranial nerve deficit. He exhibits normal muscle tone.  Skin: Skin is warm and dry.    Assessment/Plan:  41 yo man who underwent gastric bypass in July with intraabdominal abscesses and likely dehiscence of his anastamosis who has developed a left pleural effusion. Actually has bilateral effusions but L>R. Thoracentesis was unsuccessful in obtaining fluid. Likely a reactive effusion to intraabdominal process.  Reviewed the CT images. The effusion tracks posterolaterally and is moderate in size.   I think this can probably be managed non-operatively with a percutaneous drain and thrombolytics. Doubt he will need a VATS  Albert White 01/24/2017, 7:55 PM

## 2017-01-25 DIAGNOSIS — J90 Pleural effusion, not elsewhere classified: Secondary | ICD-10-CM

## 2017-01-25 LAB — BASIC METABOLIC PANEL
ANION GAP: 7 (ref 5–15)
BUN: 8 mg/dL (ref 6–20)
CALCIUM: 8.2 mg/dL — AB (ref 8.9–10.3)
CO2: 28 mmol/L (ref 22–32)
Chloride: 100 mmol/L — ABNORMAL LOW (ref 101–111)
Creatinine, Ser: 0.46 mg/dL — ABNORMAL LOW (ref 0.61–1.24)
GLUCOSE: 160 mg/dL — AB (ref 65–99)
Potassium: 4 mmol/L (ref 3.5–5.1)
Sodium: 135 mmol/L (ref 135–145)

## 2017-01-25 LAB — AEROBIC/ANAEROBIC CULTURE (SURGICAL/DEEP WOUND)

## 2017-01-25 LAB — CBC
HCT: 27.9 % — ABNORMAL LOW (ref 39.0–52.0)
Hemoglobin: 8.3 g/dL — ABNORMAL LOW (ref 13.0–17.0)
MCH: 25.5 pg — ABNORMAL LOW (ref 26.0–34.0)
MCHC: 29.7 g/dL — ABNORMAL LOW (ref 30.0–36.0)
MCV: 85.6 fL (ref 78.0–100.0)
PLATELETS: 418 10*3/uL — AB (ref 150–400)
RBC: 3.26 MIL/uL — ABNORMAL LOW (ref 4.22–5.81)
RDW: 15.8 % — AB (ref 11.5–15.5)
WBC: 4.7 10*3/uL (ref 4.0–10.5)

## 2017-01-25 LAB — GLUCOSE, CAPILLARY
GLUCOSE-CAPILLARY: 124 mg/dL — AB (ref 65–99)
Glucose-Capillary: 140 mg/dL — ABNORMAL HIGH (ref 65–99)
Glucose-Capillary: 146 mg/dL — ABNORMAL HIGH (ref 65–99)
Glucose-Capillary: 151 mg/dL — ABNORMAL HIGH (ref 65–99)
Glucose-Capillary: 135 mg/dL — ABNORMAL HIGH (ref 65–99)
Glucose-Capillary: 118 mg/dL — ABNORMAL HIGH (ref 65–99)

## 2017-01-25 MED ORDER — FAT EMULSION 20 % IV EMUL
240.0000 mL | INTRAVENOUS | Status: AC
  Administered 2017-01-25: 240 mL via INTRAVENOUS
  Filled 2017-01-25: qty 250

## 2017-01-25 MED ORDER — ACETAMINOPHEN 500 MG PO TABS
1000.0000 mg | ORAL_TABLET | Freq: Three times a day (TID) | ORAL | Status: DC
  Administered 2017-01-25 – 2017-02-03 (×22): 1000 mg via ORAL
  Filled 2017-01-25 (×25): qty 2

## 2017-01-25 MED ORDER — TRACE MINERALS CR-CU-MN-SE-ZN 10-1000-500-60 MCG/ML IV SOLN
INTRAVENOUS | Status: AC
  Administered 2017-01-25: 18:00:00 via INTRAVENOUS
  Filled 2017-01-25: qty 1992

## 2017-01-25 MED ORDER — MAGNESIUM SULFATE IN D5W 1-5 GM/100ML-% IV SOLN
1.0000 g | Freq: Once | INTRAVENOUS | Status: AC
  Administered 2017-01-25: 1 g via INTRAVENOUS
  Filled 2017-01-25: qty 100

## 2017-01-25 MED ORDER — HYDROMORPHONE HCL 1 MG/ML IJ SOLN
0.5000 mg | INTRAMUSCULAR | Status: DC | PRN
  Administered 2017-01-25 (×2): 1.5 mg via INTRAVENOUS
  Administered 2017-01-25: 1 mg via INTRAVENOUS
  Administered 2017-01-25: 1.5 mg via INTRAVENOUS
  Administered 2017-01-26: 1 mg via INTRAVENOUS
  Administered 2017-01-26: 1.5 mg via INTRAVENOUS
  Administered 2017-01-26: 1 mg via INTRAVENOUS
  Filled 2017-01-25 (×8): qty 2

## 2017-01-25 MED ORDER — OXYCODONE HCL 5 MG PO TABS
10.0000 mg | ORAL_TABLET | ORAL | Status: DC | PRN
  Administered 2017-01-25 – 2017-01-27 (×9): 10 mg via ORAL
  Filled 2017-01-25 (×11): qty 2

## 2017-01-25 MED ORDER — OXYCODONE HCL 5 MG PO TABS
5.0000 mg | ORAL_TABLET | ORAL | Status: DC | PRN
  Filled 2017-01-25: qty 1

## 2017-01-25 NOTE — Consult Note (Signed)
PULMONARY / CRITICAL CARE MEDICINE   Name: Albert PetrinSteven T White MRN: [id number] DOB: 10/06/1975    ADMISSION DATE:  01/15/2017 CONSULTATION DATE:  01/25/2017  REFERRING MD:  Dr. Dorris FetchHendrickson  CHIEF COMPLAINT:  Pleural effusion  HISTORY OF PRESENT ILLNESS:   41 yo male had roux en y surgery [facility] July 23.  He developed scrotal swelling and pain.  Found to have hydrocele and varicocele.  He was then found to have abdominal wall hematoma, fever 103F, and seroma.  He had CT abd which showed abscess.  He was admitted to [facility] and seen by surgery.  He had drain placed by IR and started on Abx.  He was noted to have b/l pleural effusions.  He had attempted thoracentesis with only 25 ml of fluid able to be removed.  He was seen by thoracic surgery.  PAST MEDICAL HISTORY :  He  has a past medical history of Abscess; Diabetes mellitus without complication (HCC); Hidradenitis suppurativa; Hyperlipemia; Morbid obesity (HCC); and Vertigo.  PAST SURGICAL HISTORY: He  has a past surgical history that includes Incision and drainage deep neck abscess; Incision and drainage abscess (Right, 12/15/2013); and IR THORACENTESIS ASP PLEURAL SPACE W/IMG GUIDE (01/23/2017).  No Known Allergies  No current facility-administered medications on file prior to encounter.    Current Outpatient Prescriptions on File Prior to Encounter  Medication Sig  . acetaminophen (TYLENOL) 325 MG tablet Take 2 tablets (650 mg total) by mouth every 8 (eight) hours. (Patient taking differently: Take 650 mg by mouth every 8 (eight) hours as needed for moderate pain or headache. )  . atorvastatin (LIPITOR) 10 MG tablet Take 10 mg by mouth every evening.   Marland Kitchen. OVER THE COUNTER MEDICATION Apply 1 application topically as needed (eczema). [employer] Eczema Cream   . oxyCODONE (ROXICODONE) 5 MG immediate release tablet Take 1 tablet (5 mg total) by mouth every 6 (six) hours as needed for severe pain.  . traZODone (DESYREL) 100 MG tablet  Take 100 mg by mouth at bedtime.  . cephALEXin (KEFLEX) 500 MG capsule Take 1 capsule (500 mg total) by mouth 3 (three) times daily. (Patient not taking: Reported on 01/15/2017)  . doxycycline (VIBRA-TABS) 100 MG tablet Take 1 tablet (100 mg total) by mouth 2 (two) times daily. (Patient not taking: Reported on 01/15/2017)  . metFORMIN (GLUCOPHAGE) 500 MG tablet Take 2 tablets (1,000 mg total) by mouth 2 (two) times daily with a meal. (Patient not taking: Reported on 01/15/2017)    FAMILY HISTORY:  His indicated that his mother is deceased. He indicated that his father is deceased. He indicated that both of his sisters are alive. He indicated that his brother is alive.    SOCIAL HISTORY: He  reports that he quit smoking about 3 years ago. He smoked 1.00 pack per day. He has never used smokeless tobacco. He reports that he does not drink alcohol or use drugs.  REVIEW OF SYSTEMS: Mild chest pain.  Abdomen sore.  Dry cough.  Otherwise negative.  VITAL SIGNS: BP 134/70 (BP Location: Left Arm)   Pulse 87   Temp (!) 97.5 F (36.4 C) (Oral)   Resp 19   Ht 5\' 10"  (1.778 m)   Wt (!) 342 lb 11.2 oz (155.4 kg)   SpO2 98%   BMI 49.17 kg/m   INTAKE / OUTPUT: I/O last 3 completed shifts: In: 5943.2 [P.O.:1200; I.V.:3753.2; Other:30; IV Piggyback:960] Out: 5320 [Urine:5100; Drains:220]  PHYSICAL EXAMINATION:  General - pleasant, mild increased WOB  Eyes - pupils reactive ENT - no sinus tenderness, no oral exudate, no LAN Cardiac - regular, no murmur Chest - decreased BS Lt base Abd - soft, mild tenderness Ext - no edema Skin - no rashes Neuro - normal strength Psych - normal mood   LABS:  BMET  Recent Labs Lab 01/23/17 1248 01/24/17 0501 01/25/17 0458  NA 135 133* 135  K 4.2 3.8 4.0  CL 97* 97* 100*  CO2 BUN CREATININE 0.60* 0.48* 0.46*  GLUCOSE 199* 169* 160*    Electrolytes  Recent Labs Lab 01/21/17 0455 01/22/17 0553 01/23/17 1248 01/24/17 0501  01/25/17 0458  CALCIUM 7.9* 8.1* 8.6* 8.1* 8.2*  MG 1.8 1.7  --  1.7  --   PHOS 5.0* 5.3*  --  3.9  --     CBC  Recent Labs Lab 01/22/17 0553 01/23/17 1248 01/25/17 0458  WBC 5.3 6.9 4.7  HGB 8.9* 9.4* 8.3*  HCT 29.3* 31.2* 27.9*  PLT 352 401* 418*    Coag's  Recent Labs Lab 01/20/17 1744 01/23/17 0723  INR 1.19 1.12    Sepsis Markers No results for input(s): LATICACIDVEN, PROCALCITON, O2SATVEN in the last 168 hours.  ABG No results for input(s): PHART, PCO2ART, PO2ART in the last 168 hours.  Liver Enzymes  Recent Labs Lab 01/19/17 0516 01/22/17 0553 01/23/17 1248  AST ALT 11* 18 19  ALKPHOS 116 139* 175*  BILITOT 0.3 0.6 0.8  ALBUMIN 1.6* 1.6* 1.9*    Cardiac Enzymes No results for input(s): TROPONINI, PROBNP in the last 168 hours.  Glucose  Recent Labs Lab 01/24/17 1218 01/24/17 1558 01/24/17 2022 01/25/17 0020 01/25/17 0417 01/25/17 0808  GLUCAP 126* 121* 127* 140* 146* 151*    Imaging Ct Chest Wo Contrast  Result Date: 01/24/2017 CLINICAL DATA:  Left pleural effusion status post thoracentesis EXAM: CT CHEST WITHOUT CONTRAST TECHNIQUE: Multidetector CT imaging of the chest was performed following the standard protocol without IV contrast. COMPARISON:  Radiograph 01/23/2017, CT abdomen pelvis 01/19/2017, 01/15/2017 FINDINGS: Cardiovascular: Limited evaluation without intravenous contrast. Non aneurysmal aorta. Right-sided central venous catheter tip is at the brachiocephalic confluence. Normal heart size. No significant pericardial effusion Mediastinum/Nodes: Midline trachea. No thyroid mass. No significantly enlarged mediastinal lymph nodes. Limited assessment of hilar adenopathy. Esophagus within normal limits. Lungs/Pleura: Partial lower lobe consolidations may reflect atelectasis or pneumonia. Moderate left pleural effusion with some loculation anteriorly. Small right-sided pleural effusion. Negative for a pneumothorax Upper Abdomen:  Interval placement of pigtail drainage catheter adjacent to the bypassed stomach with decreased abscess collection since previous CT. Grossly stable perisplenic fluid collection measuring approximately 11.3 x 2.5 cm on axial images. Interval increase in a right perihepatic fluid collection, this measures 15 cm x 6.8 cm by 16.3 cm. Mass effect on the underlying liver. Musculoskeletal: No acute or suspicious bone lesion IMPRESSION: 1. Small right pleural effusion and moderate left pleural effusion with some loculation of the pleural fluid on the left. Negative for a pneumothorax. Partial consolidations in the lower lobes may reflect atelectasis or pneumonia 2. Interim placement of a pigtail drainage catheter adjacent to the gastric sutures/gastro jejunal anastomosis with resolution of previously noted gas and fluid collection 3. Interval increase in size of a right perihepatic fluid collection since the prior CT, measuring at least 16.3 cm. 4. Grossly stable left perisplenic fluid collection Electronically Signed   By: Jasmine Pang M.D.   On: 01/24/2017 19:52  STUDIES:  Lt pleural fluid 9/07 >> glucose 130, protein 3.8, LDH 453, WBC 3750 (54 N, 43 L) CT chest 9/08 >> lower lobe consolidation b/l, mod Lt effusion, small Rt effusion  CULTURES: Blood 8/30 >> Abdominal abscess 8/31 >> Streptococcus constellatus, gardnerella vaginalis Abdominal abscess 9/05 >> citrobacter freundii, viridans streptococcus Lt pleural fluid 9/07 >>  ANTIBIOTICS: Zosyn 8/30 >> 9/05 Vancomycin 9/05 >> 9/06 Cefepime 9/07 >> Flagyl 9/07 >>   SIGNIFICANT EVENTS: 8/31 Admit, surgery consulted, abscess drain by IR 9/04 2nd drain by IR 9/07 Lt thoracentesis  LINES/TUBES: Rt PICC 8/31 >>  ASSESSMENT / PLAN:  Exudative Lt pleural effusion. - unable to remove more than 25 ml of fluid by IR during thoracentesis attempt - explained to pt and wife that he should have catheter drainage +/- thrombolytics - they are both  frustrated by repeated complications after his Roux en Y, and would like to consider further whether he should under yet another procedure - I explained to them that waiting to drain effusion can lead to further complications and might later necessitate a VATS - continue Abx - can consult IR for left pleural fluid catheter if pt and wife agree to proceed  Coralyn Helling, MD [facility] Pulmonary/Critical Care 01/25/2017, 12:14 PM Pager:  [phone] After 3pm call: [phone]

## 2017-01-25 NOTE — Progress Notes (Signed)
      [address]       [place] [id number]             [phone]      Breathing is "about the same"  BP 134/70 (BP Location: Left Arm)   Pulse 87   Temp (!) 97.5 F (36.4 C) (Oral)   Resp 19   Ht 5\' 10"  (1.778 m)   Wt (!) 342 lb 11.2 oz (155.4 kg)   SpO2 98%   BMI 49.17 kg/m    Intake/Output Summary (Last 24 hours) at 01/25/17 1239 Last data filed at 01/25/17 [id number]  Gross per 24 hour  Intake           2846.5 ml  Output             3600 ml  Net           -753.5 ml   Exam unchanged  I had a long discussion with Mr and Mrs [name]. They are understandably frustrated with his complicated course after gastric bypass surgery. I explained the 3 options are observation, percutaneous drainage +/- thrombolytics and surgery. I doubt the fluid will resolve on its own although that is not impossible. I think the best option is a trial of perc drainage and thrombolytics and Dr. Craige CottaSood agrees. They are willing to proceed. They understand there is no guarantee of a successful outcome and there is a risk of complications, albeit less than the complications associated with VATS.   Salvatore DecentSteven C. Dorris FetchHendrickson, MD [facility] [phone]) [phone]

## 2017-01-25 NOTE — Progress Notes (Signed)
Subjective/Chief Complaint: Complains of abdominal pain, but not worse with oral intake Denies SOB   Objective: Vital signs in last 24 hours: Temp:  [97.5 F (36.4 C)-97.7 F (36.5 C)] 97.5 F (36.4 C) (09/09 0416) Pulse Rate:  [87-95] 87 (09/09 0416) Resp:  [17-19] 19 (09/09 0416) BP: (134-151)/(70-84) 134/70 (09/09 0416) SpO2:  [97 %-99 %] 98 % (09/09 0416) Weight:  [155.4 kg (342 lb 11.2 oz)] 155.4 kg (342 lb 11.2 oz) (09/09 0416) Last BM Date: 01/24/17  Intake/Output from previous day: 09/08 0701 - 09/09 0700 In: 2856.5 [P.O.:540; I.V.:1876.5; IV Piggyback:420] Out: 3000 [Urine:2850; Drains:150] Intake/Output this shift: No intake/output data recorded.  Exam: Looks more comfortable Abdomen soft, obese, drainage cloudy, tender without gross peritonitis Lungs decreased BS on left  Lab Results:   Recent Labs  01/23/17 1248 01/25/17 0458  WBC 6.9 4.7  HGB 9.4* 8.3*  HCT 31.2* 27.9*  PLT 401* 418*   BMET  Recent Labs  01/24/17 0501 01/25/17 0458  NA 133* 135  K 3.8 4.0  CL 97* 100*  CO2 29 28  GLUCOSE 169* 160*  BUN 9 8  CREATININE 0.48* 0.46*  CALCIUM 8.1* 8.2*   PT/INR  Recent Labs  01/23/17 0723  LABPROT 14.3  INR 1.12   ABG No results for input(s): PHART, HCO3 in the last 72 hours.  Invalid input(s): PCO2, PO2  Studies/Results: Dg Chest 1 View  Result Date: 01/23/2017 CLINICAL DATA:  Status post left-sided thoracentesis EXAM: CHEST 1 VIEW COMPARISON:  January 22, 2017 FINDINGS: No pneumothorax. There remains moderate at least partially loculated pleural effusion on the left. There is a small pleural effusion on the right. There is bibasilar atelectasis. Heart is borderline enlarged with pulmonary venous hypertension. There is no frank edema or consolidation. Central catheter tip is in the superior vena cava. No adenopathy evident. No bone lesions. IMPRESSION: No pneumothorax following thoracentesis. Moderate pleural effusion, at least  partially loculated, on the left remains. Small right pleural effusion present. Bibasilar atelectasis. Underlying pulmonary vascular congestion. Electronically Signed   By: Bretta Bang III M.D.   On: 01/23/2017 12:33   Ct Chest Wo Contrast  Result Date: 01/24/2017 CLINICAL DATA:  Left pleural effusion status post thoracentesis EXAM: CT CHEST WITHOUT CONTRAST TECHNIQUE: Multidetector CT imaging of the chest was performed following the standard protocol without IV contrast. COMPARISON:  Radiograph 01/23/2017, CT abdomen pelvis 01/19/2017, 01/15/2017 FINDINGS: Cardiovascular: Limited evaluation without intravenous contrast. Non aneurysmal aorta. Right-sided central venous catheter tip is at the brachiocephalic confluence. Normal heart size. No significant pericardial effusion Mediastinum/Nodes: Midline trachea. No thyroid mass. No significantly enlarged mediastinal lymph nodes. Limited assessment of hilar adenopathy. Esophagus within normal limits. Lungs/Pleura: Partial lower lobe consolidations may reflect atelectasis or pneumonia. Moderate left pleural effusion with some loculation anteriorly. Small right-sided pleural effusion. Negative for a pneumothorax Upper Abdomen: Interval placement of pigtail drainage catheter adjacent to the bypassed stomach with decreased abscess collection since previous CT. Grossly stable perisplenic fluid collection measuring approximately 11.3 x 2.5 cm on axial images. Interval increase in a right perihepatic fluid collection, this measures 15 cm x 6.8 cm by 16.3 cm. Mass effect on the underlying liver. Musculoskeletal: No acute or suspicious bone lesion IMPRESSION: 1. Small right pleural effusion and moderate left pleural effusion with some loculation of the pleural fluid on the left. Negative for a pneumothorax. Partial consolidations in the lower lobes may reflect atelectasis or pneumonia 2. Interim placement of a pigtail drainage catheter adjacent to the gastric  sutures/gastro jejunal anastomosis with resolution of previously noted gas and fluid collection 3. Interval increase in size of a right perihepatic fluid collection since the prior CT, measuring at least 16.3 cm. 4. Grossly stable left perisplenic fluid collection Electronically Signed   By: Jasmine Pang M.D.   On: 01/24/2017 19:52   Dg Kayleen Memos W/water Sol Cm  Result Date: 01/23/2017 CLINICAL DATA:  Inpatient. 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 Status post right lower quadrant percutaneous drain placement 01/16/2017. Status post left upper quadrant percutaneous drain placement 01/21/2017. EXAM: WATER SOLUBLE UPPER GI SERIES TECHNIQUE: Single-column upper GI series was performed using water soluble contrast. CONTRAST:  75 cc Isovue-300 oral contrast. COMPARISON:  01/19/2017 CT abdomen/pelvis. FLUOROSCOPY TIME:  Fluoroscopy Time:  3 minutes 42 seconds. Number of Acquired Spot Images: 15 FINDINGS: Percutaneous pigtail drainage catheter terminates in the medial left upper quadrant. Surgical sutures are also noted in the medial left upper quadrant. Esophagus is normal in distensibility with no gross esophageal filling defects or strictures. Patient is status post gastric bypass surgery. The small gastric pouch appears intact with no filling defects or ulcers. The gastrojejunostomy appears intact with no evidence of anastomotic leak or significant narrowing at the anastomosis. Contrast passes into the normal caliber jejunal limb and proximal small bowel loops without delay. No evidence of oral contrast in the excluded distal stomach . IMPRESSION: No evidence of anastomotic leak or stricture at the gastrojejunal anastomosis. Intact gastric pouch without appreciable complication. Electronically Signed   By: Delbert Phenix M.D.   On: 01/23/2017 12:02   Ir Thoracentesis Asp Pleural Space W/img Guide  Result Date: 01/23/2017 INDICATION: Status post  gastric bypass surgery. Now with multiple intra-abdominal fluid collection that have been drained. Left pleural effusion noted on recent imaging with some dyspnea. Request is made for diagnostic and therapeutic thoracentesis. EXAM: ULTRASOUND GUIDED LEFT THORACENTESIS MEDICATIONS: 1% xylocaine COMPLICATIONS: None immediate. PROCEDURE: An ultrasound guided thoracentesis was thoroughly discussed with the patient and questions answered. The benefits, risks, alternatives and complications were also discussed. The patient understands and wishes to proceed with the procedure. Written consent was obtained. Ultrasound was performed to localize and mark an adequate pocket of fluid in the left chest. The area was then prepped and draped in the normal sterile fashion. 1% Lidocaine was used for local anesthesia. Under ultrasound guidance a Safe-T-Centesis catheter was introduced. Thoracentesis was performed. The catheter was removed and a dressing applied. FINDINGS: A total of approximately 25 cc of serosanguineous fluid was removed. Samples were sent to the laboratory as requested by the clinical team. The fluid appeared to have some complexity to it. The right side was imaged as well and no effusion was noted. IMPRESSION: Successful ultrasound guided left thoracentesis yielding 25 cc of pleural fluid. Read by: Barnetta Chapel, PA-C Electronically Signed   By: Malachy Moan M.D.   On: 01/23/2017 13:24    Anti-infectives: Anti-infectives    Start     Dose/Rate Route Frequency Ordered Stop   01/23/17 1500  metroNIDAZOLE (FLAGYL) IVPB 500 mg     500 mg 100 mL/hr over 60 Minutes Intravenous Every 8 hours 01/23/17 1353     01/23/17 1500  ceFEPIme (MAXIPIME) 2 g in dextrose 5 % 50 mL IVPB     2 g 100 mL/hr over 30 Minutes Intravenous Every 8 hours 01/23/17 1357     01/22/17 2000  vancomycin (VANCOCIN) 1,250 mg in sodium chloride 0.9 % 250 mL IVPB  Status:  Discontinued     1,250 mg 166.7 mL/hr over 90 Minutes  Intravenous Every 8 hours 01/22/17 1202 01/23/17 1358   01/22/17 1230  vancomycin (VANCOCIN) 2,500 mg in sodium chloride 0.9 % 500 mL IVPB     2,500 mg 250 mL/hr over 120 Minutes Intravenous  Once 01/22/17 1147 01/22/17 1438   01/15/17 1815  piperacillin-tazobactam (ZOSYN) IVPB 3.375 g  Status:  Discontinued     3.375 g 12.5 mL/hr over 240 Minutes Intravenous Every 8 hours 01/15/17 1804 01/15/17 1804   01/15/17 1630  piperacillin-tazobactam (ZOSYN) IVPB 3.375 g  Status:  Discontinued     3.375 g 12.5 mL/hr over 240 Minutes Intravenous Every 8 hours 01/15/17 1616 01/23/17 1354      Assessment/Plan:  Anastomotic leak s/p gastric bypass done elsewhere s/p IR placed drains.  Drainage remains about the same on oral intake Will defer to our bariatric surgeons as to the final plan.  They will see this week.  Will repeat CXR tomorrow.  The patient and his wife are concerned about having any other drains.  LOS: 10 days    Albert White A 01/25/2017

## 2017-01-25 NOTE — Progress Notes (Signed)
PROGRESS NOTE    Albert White  ZOX:[id number] DOB: 1975/05/31 DOA: 01/15/2017 PCP: Alysia Penna, MD   Brief Narrative:  41 year old male who presented with abdominal pain. Is known to have Albert White recent Roux-en-Y/gastric bypass on 12/08/16 in [place] by Dr. Franki Cabot, rehospitalized 12/30/16-01/10/17 for anasarca, abdominal pain, fever of 103F and postoperative seroma for which Albert White percutaneous drain was placed. Also has Albert White diagnosis of type 2 diabetes mellitus, dyslipidemia and obstructive sleep apnea. After his discharge patient complained of persistent abdominal pain, associated with nausea, emesis and decreased appetite, outpatient CT of the abdomen showedgas containing 10.7 x 5 cm abscess abutting the posterior margin of the GJ anastomosis worrisome for dehiscence, diffuse peritonitis with large gas containing abscess throughout the peritoneal cavity including Ayaan Albert White right perihepatic abscess compressing the right liver lobe and provoking Albert White, findings that prompted rehospitalization.   Patient was admitted to the hospital with the working diagnosis of abdominal sepsis due to peritonitis, and intra-abdominal abscess related to Albert White postoperative complication of Roux-en-Y/gastric bypass (12/08/16).   Assessment & Plan:   Active Problems:   Postprocedural intraabdominal abscess   1. Sepsis, present on admission due to peritonitis and intra-abdominal abscess.  Antibiotic therapy with IV flagyl and cefepime (9/7 - ) (zosyn 8/30 - 9/7 -- vanc 9/6- 9/7 ).  Percutaneous drain in place (8/31) with purulent drainage in RLQ (80 cc in past 24) Epigastric drain placed (9/5) with serosanguinous drainage. (50 cc in past 24) 8/31 cx with rare gardenella vaginalis and rare strep constellatus 9/5 cx with few viridans strep and citrobacter freundii Bcx 8/30 NGTDx5days Follow up ct with decreased in size of intra-abdominal collections, but positive findings cw dehiscence/anastomotic leak, at the gastro  jejunal anastomosis. UGI study without evidence of leak, trialing bariatric clear liquid diet Possible laparoscopy next week per surgery Switched to scheduled APAP and oxycodone 5-10 prn, trying to decrease dilaudid.  2. Thickening of Ascending colon/hepatic flexure concerning for colitis vs reactive inflammation:  Currently on flagyl and cefepime, may be related to peritonitis/intrabdominal abscess above.  Had diarrhea, but resolved.  CTM.    3. L greater than R pleural effusions:  S/p drainage of 25 cc 9/7.  Exudative, CXR with concern for loculation.  Negative culture, no WBC/organisms on gram stain, but 3750 WBC on cell count.  Suspect this is reactive with intraabdominal infections.  CT chest yesterday with moderate L pleural effusion with loculation, CT surg notes may be amenable to perc drain ant lytics.   2. Type 2 diabetes mellitus. Glucose cover and monitoring with sliding scale insulin, continue parenteral nutrition (receiving insulin with TPN as well).   3. Obstructive sleep apnea. Cpap,  oxymetry monitoring and as needed supplemental 02 per Albert White, to keep 02 saturation above 92%, continue incentive spirometer.   4. Chronic multifactorial anemia. Stable. No signs of active bleeding.  5. Morbid obesity. SP bariatric surgery, high risk for inpatient complications  6. TPN per pharmacy  DVT prophylaxis: lovenox Code Status: full  Family Communication: wife in room Disposition Plan: pending further improvement   Consultants:   Surgery  IR  pharmacy  Procedures: (Don't include imaging studies which can be auto populated. Include things that cannot be auto populated i.e. Echo, Carotid and venous dopplers, Foley, Bipap, HD, tubes/drains, wound vac, central lines etc)  Perc drain placement  Antimicrobials: (specify start and planned stop date. Auto populated tables are space occupying and do not give end dates) Anti-infectives    Start     [employer]  Frequency Ordered  Stop   01/23/17 1500  metroNIDAZOLE (FLAGYL) IVPB 500 mg     500 mg 100 mL/hr over 60 Minutes Intravenous Every 8 hours 01/23/17 1353     01/23/17 1500  ceFEPIme (MAXIPIME) 2 g in dextrose 5 % 50 mL IVPB     2 g 100 mL/hr over 30 Minutes Intravenous Every 8 hours 01/23/17 1357     01/22/17 2000  vancomycin (VANCOCIN) 1,250 mg in sodium chloride 0.9 % 250 mL IVPB  Status:  Discontinued     1,250 mg 166.7 mL/hr over 90 Minutes Intravenous Every 8 hours 01/22/17 1202 01/23/17 1358   01/22/17 1230  vancomycin (VANCOCIN) 2,500 mg in sodium chloride 0.9 % 500 mL IVPB     2,500 mg 250 mL/hr over 120 Minutes Intravenous  Once 01/22/17 1147 01/22/17 1438   01/15/17 1815  piperacillin-tazobactam (ZOSYN) IVPB 3.375 g  Status:  Discontinued     3.375 g 12.5 mL/hr over 240 Minutes Intravenous Every 8 hours 01/15/17 1804 01/15/17 1804   01/15/17 1630  piperacillin-tazobactam (ZOSYN) IVPB 3.375 g  Status:  Discontinued     3.375 g 12.5 mL/hr over 240 Minutes Intravenous Every 8 hours 01/15/17 1616 01/23/17 1354        Subjective: Doing ok.  Wants to try different pain med.    Objective: Vitals:   01/24/17 0418 01/24/17 1522 01/24/17 2026 01/25/17 0416  BP: 130/78 (!) 151/82 (!) 146/84 134/70  Pulse: 98 91 95 87  Resp: Temp: 97.7 F (36.5 C) 97.7 F (36.5 C) 97.7 F (36.5 C) (!) 97.5 F (36.4 C)  TempSrc: Oral Oral Oral Oral  SpO2: 99% 99% 97% 98%  Weight: (!) 151.4 kg (333 lb 12.8 oz)   (!) 155.4 kg (342 lb 11.2 oz)  Height:        Intake/Output Summary (Last 24 hours) at 01/25/17 1046 Last data filed at 01/25/17 0424  Gross per 24 hour  Intake           2856.5 ml  Output             3000 ml  Net           -143.5 ml   Filed Weights   01/23/17 0443 01/24/17 0418 01/25/17 0416  Weight: (!) 153.4 kg (338 lb 1.6 oz) (!) 151.4 kg (333 lb 12.8 oz) (!) 155.4 kg (342 lb 11.2 oz)    Examination:  General: No acute distress. Cardiovascular: Heart sounds show Albert White regular  rate, and rhythm. No gallops or rubs. No murmurs. No JVD. Lungs: Unlabored.  Anteriorly ctab.  On RA. Abdomen: Soft, diffusely tender, RLQ and epigastric drains,  No masses. No hepatosplenomegaly. Neurological: Alert and oriented 3. Moves all extremities 4 with equal strength. Cranial nerves II through XII grossly intact. Skin: Warm and dry. No rashes or lesions. Extremities: No clubbing or cyanosis. No edema. Pedal pulses 2+. Psychiatric: Mood and affect are normal. Insight and judgment are appropriate.  Data Reviewed: I have personally reviewed following labs and imaging studies  CBC:  Recent Labs Lab 01/19/17 0516 01/21/17 0455 01/22/17 0553 01/23/17 1248 01/25/17 0458  WBC 6.2 6.0 5.3 6.9 4.7  NEUTROABS 4.3  --   --   --   --   HGB 9.5* 8.9* 8.9* 9.4* 8.3*  HCT 32.0* 30.1* 29.3* 31.2* 27.9*  MCV 86.5 86.2 85.4 85.2 85.6  PLT 362 332 352 401* 418*   Basic Metabolic Panel:  Recent Labs Lab  01/19/17 0516 01/20/17 1745 01/21/17 0455 01/22/17 0553 01/23/17 1248 01/24/17 0501 01/25/17 0458  NA 135 125* 133* 134* 135 133* 135  K 3.8 6.1* 4.1 4.1 4.2 3.8 4.0  CL 99* 93* 97* 98* 97* 97* 100*  CO2 30 21* GLUCOSE 163* 795* 164* 161* 199* 169* 160*  BUN 6 7 5* 5* CREATININE 0.53* 0.59* 0.53* 0.53* 0.60* 0.48* 0.46*  CALCIUM 7.9* 7.8* 7.9* 8.1* 8.6* 8.1* 8.2*  MG 1.7 2.0 1.8 1.7  --  1.7  --   PHOS 4.6 6.9* 5.0* 5.3*  --  3.9  --    GFR: Estimated Creatinine Clearance: 184 mL/min (Albert White) (by C-G formula based on SCr of 0.46 mg/dL (L)). Liver Function Tests:  Recent Labs Lab 01/19/17 0516 01/22/17 0553 01/23/17 1248  AST ALT 11* 18 19  ALKPHOS 116 139* 175*  BILITOT 0.3 0.6 0.8  PROT 5.7* 5.7* 6.7  ALBUMIN 1.6* 1.6* 1.9*   No results for input(s): LIPASE, AMYLASE in the last 168 hours. No results for input(s): AMMONIA in the last 168 hours. Coagulation Profile:  Recent Labs Lab 01/20/17 1744 01/23/17 0723  INR 1.19 1.12    Cardiac Enzymes: No results for input(s): CKTOTAL, CKMB, CKMBINDEX, TROPONINI in the last 168 hours. BNP (last 3 results) No results for input(s): PROBNP in the last 8760 hours. HbA1C: No results for input(s): HGBA1C in the last 72 hours. CBG:  Recent Labs Lab 01/24/17 1558 01/24/17 2022 01/25/17 0020 01/25/17 0417 01/25/17 0808  GLUCAP 121* 127* 140* 146* 151*   Lipid Profile: No results for input(s): CHOL, HDL, LDLCALC, TRIG, CHOLHDL, LDLDIRECT in the last 72 hours. Thyroid Function Tests: No results for input(s): TSH, T4TOTAL, FREET4, T3FREE, THYROIDAB in the last 72 hours. Anemia Panel: No results for input(s): VITAMINB12, FOLATE, FERRITIN, TIBC, IRON, RETICCTPCT in the last 72 hours. Sepsis Labs: No results for input(s): PROCALCITON, LATICACIDVEN in the last 168 hours.  Recent Results (from the past 240 hour(s))  Culture, blood (Routine x 2)     Status: None   Collection Time: 01/15/17  2:20 PM  Result Value Ref Range Status   Specimen Description BLOOD LEFT HAND  Final   Special Requests   Final    BOTTLES DRAWN AEROBIC AND ANAEROBIC Blood Culture adequate volume   Culture NO GROWTH 5 DAYS  Final   Report Status 01/20/2017 FINAL  Final  Culture, blood (Routine x 2)     Status: None   Collection Time: 01/15/17  3:25 PM  Result Value Ref Range Status   Specimen Description BLOOD RIGHT HAND  Final   Special Requests   Final    BOTTLES DRAWN AEROBIC AND ANAEROBIC Blood Culture adequate volume   Culture NO GROWTH 5 DAYS  Final   Report Status 01/20/2017 FINAL  Final  MRSA PCR Screening     Status: None   Collection Time: 01/15/17 11:04 PM  Result Value Ref Range Status   MRSA by PCR NEGATIVE NEGATIVE Final    Comment:        The GeneXpert MRSA Assay (FDA approved for NASAL specimens only), is one component of [facility] Spin[facility] comprehensive MRSA colonization surveillance program. It is not intended to diagnose MRSA infection nor to guide or monitor treatment for MRSA  infections.   Aerobic/Anaerobic Culture (surgical/deep wound)     Status: None   Collection Time: 01/16/17  2:21 PM  Result Value Ref Range Status  Specimen Description ABSCESS ABDOMEN  Final   Special Requests INTRA ABDOMINAL ABSCESS  Final   Gram Stain   Final    ABUNDANT WBC PRESENT, PREDOMINANTLY PMN NO ORGANISMS SEEN    Culture   Final    RARE GARDNERELLA VAGINALIS Standardized susceptibility testing for this organism is not available. RARE STREPTOCOCCUS CONSTELLATUS CALL MICROBIOLOGY LAB IF SENSITIVITIES ARE REQUIRED.    Report Status 01/20/2017 FINAL  Final  Aerobic/Anaerobic Culture (surgical/deep wound)     Status: None (Preliminary result)   Collection Time: 01/21/17 10:04 AM  Result Value Ref Range Status   Specimen Description ABSCESS  Final   Special Requests DRAIN POST CT GUIDED PLACEMENT  Final   Gram Stain   Final    ABUNDANT WBC PRESENT,BOTH PMN AND MONONUCLEAR ABUNDANT GRAM POSITIVE COCCI IN PAIRS ABUNDANT GRAM VARIABLE ROD    Culture   Final    ABUNDANT CITROBACTER FREUNDII FEW VIRIDANS STREPTOCOCCUS CALL MICROBIOLOGY LAB IF SENSITIVITIES ARE REQUIRED. HOLDING FOR POSSIBLE ANAEROBE    Report Status PENDING  Incomplete   Organism ID, Bacteria CITROBACTER FREUNDII  Final      Susceptibility   Citrobacter freundii - MIC*    CEFAZOLIN >=64 RESISTANT Resistant     CEFEPIME <=1 SENSITIVE Sensitive     CEFTAZIDIME >=64 RESISTANT Resistant     CEFTRIAXONE 16 INTERMEDIATE Intermediate     CIPROFLOXACIN <=0.25 SENSITIVE Sensitive     GENTAMICIN <=1 SENSITIVE Sensitive     IMIPENEM <=0.25 SENSITIVE Sensitive     TRIMETH/SULFA <=20 SENSITIVE Sensitive     PIP/TAZO 64 INTERMEDIATE Intermediate     * ABUNDANT CITROBACTER FREUNDII  Gram stain     Status: None   Collection Time: 01/23/17 12:53 PM  Result Value Ref Range Status   Specimen Description PLEURAL LEFT  Final   Special Requests NONE  Final   Gram Stain NO WBC SEEN NO ORGANISMS SEEN   Final    Report Status 01/23/2017 FINAL  Final  Culture, body fluid-bottle     Status: None (Preliminary result)   Collection Time: 01/23/17 12:53 PM  Result Value Ref Range Status   Specimen Description PLEURAL LEFT  Final   Special Requests NONE  Final   Culture NO GROWTH 2 DAYS  Final   Report Status PENDING  Incomplete         Radiology Studies: Dg Chest 1 View  Result Date: 01/23/2017 CLINICAL DATA:  Status post left-sided thoracentesis EXAM: CHEST 1 VIEW COMPARISON:  January 22, 2017 FINDINGS: No pneumothorax. There remains moderate at least partially loculated pleural effusion on the left. There is Param Capri small pleural effusion on the right. There is bibasilar atelectasis. Heart is borderline enlarged with pulmonary venous hypertension. There is no frank edema or consolidation. Central catheter tip is in the superior vena cava. No adenopathy evident. No bone lesions. IMPRESSION: No pneumothorax following thoracentesis. Moderate pleural effusion, at least partially loculated, on the left remains. Small right pleural effusion present. Bibasilar atelectasis. Underlying pulmonary vascular congestion. Electronically Signed   By: Albert White  Albert III M.D.   On: 01/23/2017 12:33   Ct Chest Wo Contrast  Result Date: 01/24/2017 CLINICAL DATA:  Left pleural effusion status post thoracentesis EXAM: CT CHEST WITHOUT CONTRAST TECHNIQUE: Multidetector CT imaging of the chest was performed following the standard protocol without IV contrast. COMPARISON:  Radiograph 01/23/2017, CT abdomen pelvis 01/19/2017, 01/15/2017 FINDINGS: Cardiovascular: Limited evaluation without intravenous contrast. Non aneurysmal aorta. Right-sided central venous catheter tip is at the brachiocephalic confluence. Normal heart size.  No significant pericardial effusion Mediastinum/Nodes: Midline trachea. No thyroid mass. No significantly enlarged mediastinal lymph nodes. Limited assessment of hilar adenopathy. Esophagus within normal limits.  Lungs/Pleura: Partial lower lobe consolidations may reflect atelectasis or pneumonia. Moderate left pleural effusion with some loculation anteriorly. Small right-sided pleural effusion. Negative for Lotta Frankenfield pneumothorax Upper Abdomen: Interval placement of pigtail drainage catheter adjacent to the bypassed stomach with decreased abscess collection since previous CT. Grossly stable perisplenic fluid collection measuring approximately 11.3 x 2.5 cm on axial images. Interval increase in Mendy Lapinsky right perihepatic fluid collection, this measures 15 cm x 6.8 cm by 16.3 cm. Mass effect on the underlying liver. Musculoskeletal: No acute or suspicious bone lesion IMPRESSION: 1. Small right pleural effusion and moderate left pleural effusion with some loculation of the pleural fluid on the left. Negative for Albert White pneumothorax. Partial consolidations in the lower lobes may reflect atelectasis or pneumonia 2. Interim placement of Annina Piotrowski pigtail drainage catheter adjacent to the gastric sutures/gastro jejunal anastomosis with resolution of previously noted gas and fluid collection 3. Interval increase in size of Ardyth Kelso right perihepatic fluid collection since the prior CT, measuring at least 16.3 cm. 4. Grossly stable left perisplenic fluid collection Electronically Signed   By: Albert Pang M.D.   On: 01/24/2017 19:52   Dg Kayleen Memos W/water Sol Cm  Result Date: 01/23/2017 CLINICAL DATA:  Inpatient. 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 Status post right lower quadrant percutaneous drain placement 01/16/2017. Status post left upper quadrant percutaneous drain placement 01/21/2017. EXAM: WATER SOLUBLE UPPER GI SERIES TECHNIQUE: Single-column upper GI series was performed using water soluble contrast. CONTRAST:  75 cc Isovue-300 oral contrast. COMPARISON:  01/19/2017 CT abdomen/pelvis. FLUOROSCOPY TIME:  Fluoroscopy Time:  3 minutes 42 seconds. Number of Acquired Spot Images: 15  FINDINGS: Percutaneous pigtail drainage catheter terminates in the medial left upper quadrant. Surgical sutures are also noted in the medial left upper quadrant. Esophagus is normal in distensibility with no gross esophageal filling defects or strictures. Patient is status post gastric bypass surgery. The small gastric pouch appears intact with no filling defects or ulcers. The gastrojejunostomy appears intact with no evidence of anastomotic leak or significant narrowing at the anastomosis. Contrast passes into the normal caliber jejunal limb and proximal small bowel loops without delay. No evidence of oral contrast in the excluded distal stomach . IMPRESSION: No evidence of anastomotic leak or stricture at the gastrojejunal anastomosis. Intact gastric pouch without appreciable complication. Electronically Signed   By: Albert Phenix M.D.   On: 01/23/2017 12:02   Ir Thoracentesis Asp Pleural Space W/img Guide  Result Date: 01/23/2017 INDICATION: Status post gastric bypass surgery. Now with multiple intra-abdominal fluid collection that have been drained. Left pleural effusion noted on recent imaging with some dyspnea. Request is made for diagnostic and therapeutic thoracentesis. EXAM: ULTRASOUND GUIDED LEFT THORACENTESIS MEDICATIONS: 1% xylocaine COMPLICATIONS: None immediate. PROCEDURE: An ultrasound guided thoracentesis was thoroughly discussed with the patient and questions answered. The benefits, risks, alternatives and complications were also discussed. The patient understands and wishes to proceed with the procedure. Written consent was obtained. Ultrasound was performed to localize and mark an adequate pocket of fluid in the left chest. The area was then prepped and draped in the normal sterile fashion. 1% Lidocaine was used for local anesthesia. Under ultrasound guidance Mackinzee Roszak Safe-T-Centesis catheter was introduced. Thoracentesis was performed. The catheter was removed and Acsa Estey dressing applied. FINDINGS: Zury Fazzino total  of approximately 25 cc of serosanguineous fluid  was removed. Samples were sent to the laboratory as requested by the clinical team. The fluid appeared to have some complexity to it. The right side was imaged as well and no effusion was noted. IMPRESSION: Successful ultrasound guided left thoracentesis yielding 25 cc of pleural fluid. Read by: Albert White [place], PA-C Electronically Signed   By: Malachy Moan M.D.   On: 01/23/2017 13:24        Scheduled Meds: . acetaminophen  1,000 mg Oral Q8H  . chlorhexidine  15 mL Mouth Rinse BID  . Chlorhexidine Gluconate Cloth  6 each Topical Daily  . enoxaparin (LOVENOX) injection  40 mg Subcutaneous Q12H  . hydrocortisone   Rectal BID  . insulin aspart  0-20 Units Subcutaneous Q4H  . mouth rinse  15 mL Mouth Rinse q12n4p  . pneumococcal 23 valent vaccine  0.5 mL Intramuscular Tomorrow-1000  . protein supplement shake  2 oz Oral QID  . sodium chloride flush  5 mL Intravenous Q8H  . sodium chloride flush  5 mL Intravenous Q8H   Continuous Infusions: . Marland KitchenTPN (CLINIMIX-E) Adult     And  . fat emulsion    . sodium chloride 35 mL/hr at 01/23/17 0608  . ceFEPime (MAXIPIME) IV Stopped (01/25/17 0859)  . magnesium sulfate 1 - 4 g bolus IVPB    . methocarbamol (ROBAXIN)  IV 1,000 mg (01/25/17 0615)  . metronidazole Stopped (01/25/17 0757)  . Marland KitchenTPN (CLINIMIX-E) Adult 105 mL/hr at 01/24/17 1751     LOS: 10 days    Time spent: 35 min    Vylet Maffia Grier Mitts, MD [facility] [phone]   If 7PM-7AM, please contact night-coverage [email or web address] Password [email or web address] 01/25/2017, 10:46 AM

## 2017-01-25 NOTE — Progress Notes (Signed)
Pt has refused cpap at this time.  Rt will monitor. 

## 2017-01-25 NOTE — Progress Notes (Signed)
PHARMACY - ADULT TOTAL PARENTERAL NUTRITION CONSULT NOTE   Pharmacy Consult:  TPN Indication:  Prolonged NPO  Patient Measurements: Height: '5\' 10"'  (177.8 cm) Weight: (!) 342 lb 11.2 oz (155.4 kg) IBW/kg (Calculated) : 73 TPN AdjBW (KG): 93.4 Body mass index is 49.17 kg/m.  Assessment:  41 year old obese male s/p laparoscopic Roux-en-Y/gastric bypass surgery on 12/08/16 and hospitalization 8/14 through 01/10/17 for anasarca, abdominal pain, fever, and postoperative seroma with percutaneous drain placement now re-admitted with continued abdominal pain, occasional nausea, non-bloddy emesis, weight loss (on Lasix), and decreased appetite. CT revealed multiple large gas containing intra-abdominal abscesses concerning for dehiscence.   Patient reports drinking 1.5 Premier protein shakes yesterday.  His intake is minimal.  Patient will try to drink all 4 per day.  GI: anasarca/vol overload with perc drain placed 8/31 and 9/5, O/P 163m.  Prealbumin low at 5.3, LBM 9/5.  Pepcid in TPN, PRN Zofran, Anusol-HC for hemorrhoids *surgical G-tube eventually per Surgery, possible laparoscopy next week Endo: DM2 on metformin PTA - CBGs < 180 Insulin requirements in past 24 hrs: 20 units rSSI + 30 units in TPN Lytes: all WNL except low CL *days of TPN without lytes: 9/7 Renal: SCr 0.46 stable, BUN WNL - UOP 0.8 ml/kg/hr, NS 35 ml/hr Pulm: effusion - s/p thoracentesis 9/7, no VATs needed, back on 2L [place] (CPAP at home), Cards: HLD - VSS Hepatobil:  LFTs/ tbili / TG WN.  Alk Phos elevated at 139. Neuro: pain score 3-9, Robaxin Q8H, PRN Fentanyl/Dilaudid.  Patient appears comfortable. ID: Abx#[id number] - Cefepime/Flagyl for C.freundii peritonitis/intra-abd abscess.  Rare gardnerella/Strep constellatus in abdominal abscess cx.  Afebrile, WBC WNL  Zosyn 8/30 >> 9/7 Vanc 9/6 >> 9/7 Cefepime 9/7 >> Flagyl 9/7 >>  8/30 MRSA PCR - negative 8/30 BCx - negative 8/31 abd abscess cx - Gardnerella vaginalis, Strep  constellatus 9/5 abscess cx - C.freundii (S cefepime, Cipro, Primaxin, Septra) - holding for anaerobes 9/7 pleural fluid cx - MGTD  Best Practices: Lovenox 40 BID TPN Access: double lumen PICC placed 01/16/17 TPN start date: 01/16/17  Nutritional Goals (per RD recommendation 8/31): 2300-2500 kCal and 115-130 gm protein per day   Current Nutrition:  Clinimix E 5/15 at 83 ml/hr + ILE at 20 ml/hr x 12 hrs = 1894 kCal and 100gm protein per day Bariatric clear liquid diet (10% of meal) Premier Protein QID (received 1.5, charted 3 yesterday) = 240 calories, 45gm protein   Plan:  - Reduce Clinimix E 5/15 to 83 ml/hr + continue 20% ILE at 20 ml/hr x 12 hrs.   - TPN + shakes (not including PO intake) will provide 2134 kCal and 145gm of protein per day, meeting 93% of minimal kCal and exceeding protein need by 11%. - Daily multivitamin and trace elements in TPN - Continue resistant SSI Q4H + reduce insulin in TPN to 25 units (d/t reduced TPN rate) - Pepcid 488mdaily in TPN  - Mag sulfate 1gm IV x 1 - F/U AM labs, iron supplementation when possible   Halford Goetzke D. DaMina MarblePharmD, BCPS Pager:  [phone]/01/2017, 9:01 AM

## 2017-01-26 ENCOUNTER — Inpatient Hospital Stay (HOSPITAL_COMMUNITY): Payer: 59

## 2017-01-26 DIAGNOSIS — J90 Pleural effusion, not elsewhere classified: Secondary | ICD-10-CM

## 2017-01-26 LAB — GLUCOSE, CAPILLARY
GLUCOSE-CAPILLARY: 137 mg/dL — AB (ref 65–99)
GLUCOSE-CAPILLARY: 92 mg/dL (ref 65–99)
Glucose-Capillary: 134 mg/dL — ABNORMAL HIGH (ref 65–99)
Glucose-Capillary: 135 mg/dL — ABNORMAL HIGH (ref 65–99)
Glucose-Capillary: 141 mg/dL — ABNORMAL HIGH (ref 65–99)
Glucose-Capillary: 147 mg/dL — ABNORMAL HIGH (ref 65–99)

## 2017-01-26 LAB — COMPREHENSIVE METABOLIC PANEL
ALT: 15 U/L — ABNORMAL LOW (ref 17–63)
AST: 18 U/L (ref 15–41)
Albumin: 1.9 g/dL — ABNORMAL LOW (ref 3.5–5.0)
Alkaline Phosphatase: 116 U/L (ref 38–126)
Anion gap: 7 (ref 5–15)
BILIRUBIN TOTAL: 0.4 mg/dL (ref 0.3–1.2)
BUN: 9 mg/dL (ref 6–20)
CHLORIDE: 98 mmol/L — AB (ref 101–111)
CO2: 28 mmol/L (ref 22–32)
CREATININE: 0.48 mg/dL — AB (ref 0.61–1.24)
Calcium: 8.3 mg/dL — ABNORMAL LOW (ref 8.9–10.3)
GFR calc Af Amer: >60 mL/min (ref >60)
Glucose, Bld: 137 mg/dL — ABNORMAL HIGH (ref 65–99)
Potassium: 4 mmol/L (ref 3.5–5.1)
Sodium: 133 mmol/L — ABNORMAL LOW (ref 135–145)
TOTAL PROTEIN: 6.4 g/dL — AB (ref 6.5–8.1)

## 2017-01-26 LAB — MAGNESIUM: MAGNESIUM: 1.7 mg/dL (ref 1.7–2.4)

## 2017-01-26 LAB — CBC
HCT: 27.9 % — ABNORMAL LOW (ref 39.0–52.0)
HEMOGLOBIN: 8.2 g/dL — AB (ref 13.0–17.0)
MCH: 25.2 pg — AB (ref 26.0–34.0)
MCHC: 29.4 g/dL — AB (ref 30.0–36.0)
MCV: 85.6 fL (ref 78.0–100.0)
Platelets: 432 10*3/uL — ABNORMAL HIGH (ref 150–400)
RBC: 3.26 MIL/uL — ABNORMAL LOW (ref 4.22–5.81)
RDW: 15.8 % — ABNORMAL HIGH (ref 11.5–15.5)
WBC: 5.5 10*3/uL (ref 4.0–10.5)

## 2017-01-26 LAB — DIFFERENTIAL
Basophils Absolute: 0 10*3/uL (ref 0.0–0.1)
Basophils Relative: 0 %
EOS ABS: 0.2 10*3/uL (ref 0.0–0.7)
EOS PCT: 4 %
LYMPHS ABS: 1.3 10*3/uL (ref 0.7–4.0)
Lymphocytes Relative: 24 %
MONO ABS: 0.5 10*3/uL (ref 0.1–1.0)
MONOS PCT: 8 %
NEUTROS PCT: 64 %
Neutro Abs: 3.5 10*3/uL (ref 1.7–7.7)

## 2017-01-26 LAB — PREALBUMIN: Prealbumin: 11.1 mg/dL — ABNORMAL LOW (ref 18–38)

## 2017-01-26 LAB — PH, BODY FLUID: PH, BODY FLUID: 7.6

## 2017-01-26 LAB — PHOSPHORUS: Phosphorus: 4.7 mg/dL — ABNORMAL HIGH (ref 2.5–4.6)

## 2017-01-26 LAB — TRIGLYCERIDES: TRIGLYCERIDES: 116 mg/dL (ref <150)

## 2017-01-26 LAB — PATHOLOGIST SMEAR REVIEW

## 2017-01-26 MED ORDER — HYDROMORPHONE HCL 1 MG/ML IJ SOLN
0.5000 mg | INTRAMUSCULAR | Status: DC | PRN
  Administered 2017-01-26 (×3): 1 mg via INTRAVENOUS
  Filled 2017-01-26 (×3): qty 1

## 2017-01-26 MED ORDER — HYDROMORPHONE HCL 1 MG/ML IJ SOLN: 0.5000 mg | Freq: Four times a day (QID) | INTRAMUSCULAR | Status: DC | PRN

## 2017-01-26 MED ORDER — MAGNESIUM SULFATE IN D5W 1-5 GM/100ML-% IV SOLN
1.0000 g | Freq: Once | INTRAVENOUS | Status: AC
  Administered 2017-01-26: 1 g via INTRAVENOUS
  Filled 2017-01-26: qty 100

## 2017-01-26 MED ORDER — HYDROMORPHONE HCL 1 MG/ML IJ SOLN
1.0000 mg | INTRAMUSCULAR | Status: DC | PRN
  Administered 2017-01-26 – 2017-01-27 (×4): 2 mg via INTRAVENOUS
  Filled 2017-01-26 (×6): qty 2

## 2017-01-26 MED ORDER — ENOXAPARIN SODIUM 40 MG/0.4ML ~~LOC~~ SOLN
40.0000 mg | Freq: Two times a day (BID) | SUBCUTANEOUS | Status: DC
  Administered 2017-01-27 – 2017-02-02 (×13): 40 mg via SUBCUTANEOUS
  Filled 2017-01-26 (×13): qty 0.4

## 2017-01-26 MED ORDER — TRACE MINERALS CR-CU-MN-SE-ZN 10-1000-500-60 MCG/ML IV SOLN
INTRAVENOUS | Status: AC
  Administered 2017-01-26: 18:00:00 via INTRAVENOUS
  Filled 2017-01-26: qty 1992

## 2017-01-26 MED ORDER — FAT EMULSION 20 % IV EMUL
240.0000 mL | INTRAVENOUS | Status: AC
  Administered 2017-01-26: 240 mL via INTRAVENOUS
  Filled 2017-01-26: qty 250

## 2017-01-26 MED ORDER — HYDROMORPHONE HCL 1 MG/ML IJ SOLN: 0.5000 mg | INTRAMUSCULAR | Status: DC | PRN

## 2017-01-26 NOTE — Progress Notes (Addendum)
PROGRESS NOTE    Charlyne PetrinSteven T Puebla  OZH:[id number] DOB: 1976/03/05 DOA: 01/15/2017 PCP: Alysia PennaHolwerda, Scott, MD   Brief Narrative:  41 year old male who presented with abdominal pain. Is known to have Pyper Olexa recent Roux-en-Y/gastric bypass on 12/08/[place] in [place] by Dr. Franki Cabothomas Walsh, rehospitalized 12/30/16-01/10/17 for anasarca, abdominal pain, fever of 103F and postoperative seroma for which Rosita Guzzetta percutaneous drain was placed. Also has Kleber Crean diagnosis of type 2 diabetes mellitus, dyslipidemia and obstructive sleep apnea. After his discharge patient complained of persistent abdominal pain, associated with nausea, emesis and decreased appetite, outpatient CT of the abdomen showedgas containing 10.7 x 5 cm abscess abutting the posterior margin of the GJ anastomosis worrisome for dehiscence, diffuse peritonitis with large gas containing abscess throughout the peritoneal cavity including Deanglo Hissong right perihepatic abscess compressing the right liver lobe and provoking anileus, findings that prompted rehospitalization.   Patient was admitted to the hospital with the working diagnosis of abdominal sepsis due to peritonitis, and intra-abdominal abscess related to Linus Weckerly postoperative complication of Roux-en-Y/gastric bypass (12/08/16).   Assessment & Plan:   Active Problems:   Postprocedural intraabdominal abscess   1. Sepsis, present on admission due to peritonitis and intra-abdominal abscess.  Antibiotic therapy with IV flagyl and cefepime (9/7 - ) (zosyn 8/30 - 9/7 -- vanc 9/6- 9/7 ).  Percutaneous drain in place (8/31) with purulent drainage in RLQ (80 cc in past 24) Epigastric drain placed (9/5) with serosanguinous drainage. (10 cc in past 24) 8/31 cx with rare gardenella vaginalis and rare strep constellatus 9/5 cx with few viridans strep and citrobacter freundii and prevotella denticola Bcx 8/30 NGTDx5days Follow up ct with decreased in size of intra-abdominal collections, but positive findings cw  dehiscence/anastomotic leak, at the gastro jejunal anastomosis. [ ]  discussed interval increase in R perihepatic fluid collection on chest CT from 9/8 with surgery, ? If need to drain this UGI study without evidence of leak, trialing bariatric clear liquid diet Possible laparoscopy next week per surgery Switched to scheduled APAP and oxycodone 5-10 prn, trying to decrease dilaudid (spaced this to Q6 prn pain not controlled by oxycodone).  2. Thickening of Ascending colon/hepatic flexure concerning for colitis vs reactive inflammation:  Currently on flagyl and cefepime, may be related to peritonitis/intrabdominal abscess above.  Had diarrhea, but resolved.  CTM.    3. L greater than R pleural effusions:  S/p drainage of 25 cc 9/7.  Exudative, CXR with concern for loculation.  Negative culture, no WBC/organisms on gram stain, but 3750 WBC on cell count.  Suspect this is reactive with intraabdominal infections.  CT chest yesterday with moderate L pleural effusion with loculation.  Repeat CXR from today, stable. Pulm and CT surg following, planning for IR L pleural fluid catheter +/- lytics  2. Type 2 diabetes mellitus. Glucose cover and monitoring with sliding scale insulin, continue parenteral nutrition (receiving insulin with TPN as well).   3. Obstructive sleep apnea. Cpap,  oxymetry monitoring and as needed supplemental 02 per [place], to keep 02 saturation above 92%, continue incentive spirometer.   4. Chronic multifactorial anemia. Stable, has downtrended slowly.  CTM.  5. Morbid obesity. SP bariatric surgery, high risk for inpatient complications  6. TPN per pharmacy  DVT prophylaxis: lovenox Code Status: full  Family Communication: wife in room Disposition Plan: pending further improvement   Consultants:   Surgery  IR  pharmacy  Procedures: (Don't include imaging studies which can be auto populated. Include things that cannot be auto populated i.e. Echo, Carotid and venous  dopplers, Foley, Bipap, HD, tubes/drains, wound vac, central lines etc)  Perc drain placement x2  Thoracentesis  Antimicrobials: (specify start and planned stop date. Auto populated tables are space occupying and do not give end dates) Anti-infectives    Start     Dose/Rate Route Frequency Ordered Stop   01/23/17 1500  metroNIDAZOLE (FLAGYL) IVPB 500 mg     500 mg 100 mL/hr over 60 Minutes Intravenous Every 8 hours 01/23/17 1353     01/23/17 1500  ceFEPIme (MAXIPIME) 2 g in dextrose 5 % 50 mL IVPB     2 g 100 mL/hr over 30 Minutes Intravenous Every 8 hours 01/23/17 1357     01/22/17 2000  vancomycin (VANCOCIN) 1,250 mg in sodium chloride 0.9 % 250 mL IVPB  Status:  Discontinued     1,250 mg 166.7 mL/hr over 90 Minutes Intravenous Every 8 hours 01/22/17 1202 01/23/17 1358   01/22/17 1230  vancomycin (VANCOCIN) 2,500 mg in sodium chloride 0.9 % 500 mL IVPB     2,500 mg 250 mL/hr over 120 Minutes Intravenous  Once 01/22/17 1147 01/22/17 1438   01/15/17 1815  piperacillin-tazobactam (ZOSYN) IVPB 3.375 g  Status:  Discontinued     3.375 g 12.5 mL/hr over 240 Minutes Intravenous Every 8 hours 01/15/17 1804 01/15/17 1804   01/15/17 1630  piperacillin-tazobactam (ZOSYN) IVPB 3.375 g  Status:  Discontinued     3.375 g 12.5 mL/hr over 240 Minutes Intravenous Every 8 hours 01/15/17 1616 01/23/17 1354        Subjective: Doing ok.  Pain better in RLQ, still present in center.   Objective: Vitals:   01/25/17 0416 01/25/17 1442 01/25/17 2023 01/26/17 0409  BP: 134/70 139/80 127/77 134/78  Pulse: 87 95 96 93  Resp: Temp: (!) 97.5 F (36.4 C) 98 F (36.7 C) 98.2 F (36.8 C) 97.9 F (36.6 C)  TempSrc: Oral Oral Oral Oral  SpO2: 98% 98% 98% 97%  Weight: (!) 155.4 kg (342 lb 11.2 oz)   (!) 154.6 kg (340 lb 12.8 oz)  Height:        Intake/Output Summary (Last 24 hours) at 01/26/17 0855 Last data filed at 01/26/17 0600  Gross per 24 hour  Intake          1898.25 ml    Output             1290 ml  Net           608.25 ml   Filed Weights   01/24/17 0418 01/25/17 0416 01/26/17 0409  Weight: (!) 151.4 kg (333 lb 12.8 oz) (!) 155.4 kg (342 lb 11.2 oz) (!) 154.6 kg (340 lb 12.8 oz)    Examination:  General: No acute distress. Cardiovascular: Heart sounds show Jasara Corrigan regular rate, and rhythm. No gallops or rubs. No murmurs. No JVD. Lungs: Clear to auscultation bilaterally with good air movement. No rales, rhonchi or wheezes. Abdomen: Soft, tender diffusely, more in epigastric/midline compared to RLQ, nondistended with normal active bowel sounds. No masses. No hepatosplenomegaly.  Drains in RLQ and epigastric region. Neurological: Alert and oriented 3. Moves all extremities 4 with equal strength. Cranial nerves II through XII grossly intact. Skin: Warm and dry. No rashes or lesions. Extremities: No clubbing or cyanosis. No edema. Pedal pulses 2+.  Data Reviewed: I have personally reviewed following labs and imaging studies  CBC:  Recent Labs Lab 01/21/17 0455 01/22/17 0553 01/23/17 1248 01/25/17 0458 01/26/17 0433  WBC 6.0 5.3  6.9 4.7 5.5  NEUTROABS  --   --   --   --  3.5  HGB 8.9* 8.9* 9.4* 8.3* 8.2*  HCT 30.1* 29.3* 31.2* 27.9* 27.9*  MCV 86.2 85.4 85.2 85.6 85.6  PLT 332 352 401* 418* 432*   Basic Metabolic Panel:  Recent Labs Lab 01/20/17 1745 01/21/17 0455 01/22/17 0553 01/23/17 1248 01/24/17 0501 01/25/17 0458 01/26/17 0433  NA 125* 133* 134* 135 133* 135 133*  K 6.1* 4.1 4.1 4.2 3.8 4.0 4.0  CL 93* 97* 98* 97* 97* 100* 98*  CO2 21* GLUCOSE 795* 164* 161* 199* 169* 160* 137*  BUN 7 5* 5* CREATININE 0.59* 0.53* 0.53* 0.60* 0.48* 0.46* 0.48*  CALCIUM 7.8* 7.9* 8.1* 8.6* 8.1* 8.2* 8.3*  MG 2.0 1.8 1.7  --  1.7  --  1.7  PHOS 6.9* 5.0* 5.3*  --  3.9  --  4.7*   GFR: Estimated Creatinine Clearance: 183.3 mL/min (Mohan Erven) (by C-G formula based on SCr of 0.48 mg/dL (L)). Liver Function Tests:  Recent  Labs Lab 01/22/17 0553 01/23/17 1248 01/26/17 0433  AST ALT 18 19 15*  ALKPHOS 139* 175* 116  BILITOT 0.6 0.8 0.4  PROT 5.7* 6.7 6.4*  ALBUMIN 1.6* 1.9* 1.9*   No results for input(s): LIPASE, AMYLASE in the last 168 hours. No results for input(s): AMMONIA in the last 168 hours. Coagulation Profile:  Recent Labs Lab 01/20/17 1744 01/23/17 0723  INR 1.19 1.12   Cardiac Enzymes: No results for input(s): CKTOTAL, CKMB, CKMBINDEX, TROPONINI in the last 168 hours. BNP (last 3 results) No results for input(s): PROBNP in the last 8760 hours. HbA1C: No results for input(s): HGBA1C in the last 72 hours. CBG:  Recent Labs Lab 01/25/17 1654 01/25/17 2025 01/25/17 2351 01/26/17 0359 01/26/17 0738  GLUCAP 135* 118* 135* 134* 147*   Lipid Profile:  Recent Labs  01/26/17 0433  TRIG 116   Thyroid Function Tests: No results for input(s): TSH, T4TOTAL, FREET4, T3FREE, THYROIDAB in the last 72 hours. Anemia Panel: No results for input(s): VITAMINB12, FOLATE, FERRITIN, TIBC, IRON, RETICCTPCT in the last 72 hours. Sepsis Labs: No results for input(s): PROCALCITON, LATICACIDVEN in the last 168 hours.  Recent Results (from the past 240 hour(s))  Aerobic/Anaerobic Culture (surgical/deep wound)     Status: None   Collection Time: 01/16/17  2:21 PM  Result Value Ref Range Status   Specimen Description ABSCESS ABDOMEN  Final   Special Requests INTRA ABDOMINAL ABSCESS  Final   Gram Stain   Final    ABUNDANT WBC PRESENT, PREDOMINANTLY PMN NO ORGANISMS SEEN    Culture   Final    RARE GARDNERELLA VAGINALIS Standardized susceptibility testing for this organism is not available. RARE STREPTOCOCCUS CONSTELLATUS CALL MICROBIOLOGY LAB IF SENSITIVITIES ARE REQUIRED.    Report Status 01/20/2017 FINAL  Final  Aerobic/Anaerobic Culture (surgical/deep wound)     Status: None   Collection Time: 01/21/17 10:04 AM  Result Value Ref Range Status   Specimen Description ABSCESS   Final   Special Requests DRAIN POST CT GUIDED PLACEMENT  Final   Gram Stain   Final    ABUNDANT WBC PRESENT,BOTH PMN AND MONONUCLEAR ABUNDANT GRAM POSITIVE COCCI IN PAIRS ABUNDANT GRAM VARIABLE ROD    Culture   Final    ABUNDANT CITROBACTER FREUNDII FEW VIRIDANS STREPTOCOCCUS CALL MICROBIOLOGY LAB IF SENSITIVITIES ARE REQUIRED. ABUNDANT PREVOTELLA DENTICOLA BETA LACTAMASE POSITIVE  Report Status 01/25/2017 FINAL  Final   Organism ID, Bacteria CITROBACTER FREUNDII  Final      Susceptibility   Citrobacter freundii - MIC*    CEFAZOLIN >=64 RESISTANT Resistant     CEFEPIME <=1 SENSITIVE Sensitive     CEFTAZIDIME >=64 RESISTANT Resistant     CEFTRIAXONE 16 INTERMEDIATE Intermediate     CIPROFLOXACIN <=0.25 SENSITIVE Sensitive     GENTAMICIN <=1 SENSITIVE Sensitive     IMIPENEM <=0.25 SENSITIVE Sensitive     TRIMETH/SULFA <=20 SENSITIVE Sensitive     PIP/TAZO 64 INTERMEDIATE Intermediate     * ABUNDANT CITROBACTER FREUNDII  Gram stain     Status: None   Collection Time: 01/23/17 12:53 PM  Result Value Ref Range Status   Specimen Description PLEURAL LEFT  Final   Special Requests NONE  Final   Gram Stain NO WBC SEEN NO ORGANISMS SEEN   Final   Report Status 01/23/2017 FINAL  Final  Culture, body fluid-bottle     Status: None (Preliminary result)   Collection Time: 01/23/17 12:53 PM  Result Value Ref Range Status   Specimen Description PLEURAL LEFT  Final   Special Requests NONE  Final   Culture NO GROWTH 2 DAYS  Final   Report Status PENDING  Incomplete         Radiology Studies: Ct Chest Wo Contrast  Result Date: 01/24/2017 CLINICAL DATA:  Left pleural effusion status post thoracentesis EXAM: CT CHEST WITHOUT CONTRAST TECHNIQUE: Multidetector CT imaging of the chest was performed following the standard protocol without IV contrast. COMPARISON:  Radiograph 01/23/2017, CT abdomen pelvis 01/19/2017, 01/15/2017 FINDINGS: Cardiovascular: Limited evaluation without  intravenous contrast. Non aneurysmal aorta. Right-sided central venous catheter tip is at the brachiocephalic confluence. Normal heart size. No significant pericardial effusion Mediastinum/Nodes: Midline trachea. No thyroid mass. No significantly enlarged mediastinal lymph nodes. Limited assessment of hilar adenopathy. Esophagus within normal limits. Lungs/Pleura: Partial lower lobe consolidations may reflect atelectasis or pneumonia. Moderate left pleural effusion with some loculation anteriorly. Small right-sided pleural effusion. Negative for Jazaria Jarecki pneumothorax Upper Abdomen: Interval placement of pigtail drainage catheter adjacent to the bypassed stomach with decreased abscess collection since previous CT. Grossly stable perisplenic fluid collection measuring approximately 11.3 x 2.5 cm on axial images. Interval increase in Tuwana Kapaun right perihepatic fluid collection, this measures 15 cm x 6.8 cm by 16.3 cm. Mass effect on the underlying liver. Musculoskeletal: No acute or suspicious bone lesion IMPRESSION: 1. Small right pleural effusion and moderate left pleural effusion with some loculation of the pleural fluid on the left. Negative for Chiara Coltrin pneumothorax. Partial consolidations in the lower lobes may reflect atelectasis or pneumonia 2. Interim placement of Lavern Crimi pigtail drainage catheter adjacent to the gastric sutures/gastro jejunal anastomosis with resolution of previously noted gas and fluid collection 3. Interval increase in size of Joyanna Kleman right perihepatic fluid collection since the prior CT, measuring at least 16.3 cm. 4. Grossly stable left perisplenic fluid collection Electronically Signed   By: Jasmine Pang M.D.   On: 01/24/2017 19:52   Dg Chest Port 1 View  Result Date: 01/26/2017 CLINICAL DATA:  Pleural effusion EXAM: PORTABLE CHEST 1 VIEW COMPARISON:  01/23/2017 FINDINGS: Stable right upper extremity PICC. Stable bilateral pleural effusions and bilateral predominately basilar airspace disease left greater than  right. No pneumothorax. IMPRESSION: Stable bilateral pleural effusions and bilateral airspace disease. Electronically Signed   By: Jolaine Click M.D.   On: 01/26/2017 07:20        Scheduled Meds: . acetaminophen  1,000 mg Oral Q8H  . chlorhexidine  15 mL Mouth Rinse BID  . Chlorhexidine Gluconate Cloth  6 each Topical Daily  . enoxaparin (LOVENOX) injection  40 mg Subcutaneous Q12H  . hydrocortisone   Rectal BID  . insulin aspart  0-20 Units Subcutaneous Q4H  . mouth rinse  15 mL Mouth Rinse q12n4p  . pneumococcal 23 valent vaccine  0.5 mL Intramuscular Tomorrow-1000  . protein supplement shake  2 oz Oral QID  . sodium chloride flush  5 mL Intravenous Q8H  . sodium chloride flush  5 mL Intravenous Q8H   Continuous Infusions: . Marland KitchenTPN (CLINIMIX-E) Adult 83 mL/hr at 01/25/17 1759  . sodium chloride 35 mL/hr at 01/25/17 2046  . ceFEPime (MAXIPIME) IV 2 g (01/26/17 0800)  . methocarbamol (ROBAXIN)  IV Stopped (01/26/17 0530)  . metronidazole Stopped (01/26/17 0803)     LOS: 11 days    Time spent: 25 min    Dakoda Bassette Grier Mitts, MD [facility] [phone]   If 7PM-7AM, please contact night-coverage [email or web address] Password [email or web address] 01/26/2017, 8:55 AM

## 2017-01-26 NOTE — Progress Notes (Signed)
CC:  Abdominal pain  Subjective: Awaiting IR to drain the pleural effusion and Dr. Luisa Hart recommends we also drain the hepatic collection.   Objective: Vital signs in last 24 hours: Temp:  [97.9 F (36.6 C)-98.2 F (36.8 C)] 97.9 F (36.6 C) (09/10 0409) Pulse Rate:  [93-96] 93 (09/10 0409) Resp:  [16-18] 18 (09/10 0409) BP: (127-139)/(77-80) 134/78 (09/10 0409) SpO2:  [97 %-98 %] 97 % (09/10 0409) Weight:  [154.6 kg (340 lb 12.8 oz)] 154.6 kg (340 lb 12.8 oz) (09/10 0409) Last BM Date: 01/24/17 120 Po 1800 IV 1800 urine 90 DRains Afebrile, VSS Labs OK,  CT scan on 9/8 =>> Interval increase in size of a right perihepatic fluid collection  since the prior CT, measuring at least 16.3 cm.  Partial lower lobe consolidations may reflect atelectasis or pneumonia. Moderate left pleural effusion with some loculation anteriorly. Small right-sided pleural effusion. Negative for a pneumothorax   Intake/Output from previous day: 09/09 0701 - 09/10 0700 In: 1898.3 [P.O.:120; I.V.:1498.3; IV Piggyback:270] Out: 1890 [Urine:1800; Drains:90] Intake/Output this shift: Total I/O In: 10 [Other:10] Out: 250 [Urine:250]  General appearance: alert, cooperative, flushed and no distress walking from BR Resp: clear to auscultation bilaterally and BS down in the bases GI: drains are about the same, no issues with clears over the weekend.  Large abdomen.  Lab Results:   Recent Labs  01/25/17 0458 01/26/17 0433  WBC 4.7 5.5  HGB 8.3* 8.2*  HCT 27.9* 27.9*  PLT 418* 432*    BMET  Recent Labs  01/25/17 0458 01/26/17 0433  NA 135 133*  K 4.0 4.0  CL 100* 98*  CO2 28 28  GLUCOSE 160* 137*  BUN 8 9  CREATININE 0.46* 0.48*  CALCIUM 8.2* 8.3*   PT/INR No results for input(s): LABPROT, INR in the last 72 hours.   Recent Labs Lab 01/22/17 0553 01/23/17 1248 01/26/17 0433  AST ALT 18 19 15*  ALKPHOS 139* 175* 116  BILITOT 0.6 0.8 0.4  PROT 5.7* 6.7 6.4*   ALBUMIN 1.6* 1.9* 1.9*     Lipase  No results found for: LIPASE   Medications: . acetaminophen  1,000 mg Oral Q8H  . chlorhexidine  15 mL Mouth Rinse BID  . Chlorhexidine Gluconate Cloth  6 each Topical Daily  . enoxaparin (LOVENOX) injection  40 mg Subcutaneous Q12H  . hydrocortisone   Rectal BID  . insulin aspart  0-20 Units Subcutaneous Q4H  . mouth rinse  15 mL Mouth Rinse q12n4p  . pneumococcal 23 valent vaccine  0.5 mL Intramuscular Tomorrow-1000  . protein supplement shake  2 oz Oral QID  . sodium chloride flush  5 mL Intravenous Q8H  . sodium chloride flush  5 mL Intravenous Q8H   Anti-infectives    Start     Dose/Rate Route Frequency Ordered Stop   01/23/17 1500  metroNIDAZOLE (FLAGYL) IVPB 500 mg     500 mg 100 mL/hr over 60 Minutes Intravenous Every 8 hours 01/23/17 1353     01/23/17 1500  ceFEPIme (MAXIPIME) 2 g in dextrose 5 % 50 mL IVPB     2 g 100 mL/hr over 30 Minutes Intravenous Every 8 hours 01/23/17 1357     01/22/17 2000  vancomycin (VANCOCIN) 1,250 mg in sodium chloride 0.9 % 250 mL IVPB  Status:  Discontinued     1,250 mg 166.7 mL/hr over 90 Minutes Intravenous Every 8 hours 01/22/17 1202 01/23/17 1358   01/22/17 1230  vancomycin (VANCOCIN) 2,500 mg in sodium chloride 0.9 % 500 mL IVPB     2,500 mg 250 mL/hr over 120 Minutes Intravenous  Once 01/22/17 1147 01/22/17 1438   01/15/17 1815  piperacillin-tazobactam (ZOSYN) IVPB 3.375 g  Status:  Discontinued     3.375 g 12.5 mL/hr over 240 Minutes Intravenous Every 8 hours 01/15/17 1804 01/15/17 1804   01/15/17 1630  piperacillin-tazobactam (ZOSYN) IVPB 3.375 g  Status:  Discontinued     3.375 g 12.5 mL/hr over 240 Minutes Intravenous Every 8 hours 01/15/17 1616 01/23/17 1354      Assessment/Plan Multiple intra-abdominal abscesses Early Sepsis Status post Roux-en-Y/gastric bypass,12/08/16, Dr. Clent RidgesWalsh Fluid overload/ seroma with drainage 8/14-8/25/18 - [place] S/P perc drain 01/16/17 S/p IR drain  01/21/17  - 100 cc purulent fluid drained in the IR suite S/p IR thoracentesis 01/23/17 =>> 25 cc Exudative left pleural effusion - recommended drainage/ Dr. Alinda SierrasSood/Hendrickson =>> IR perc drain and thrombolytics pending DM HLD Morbid Obesity Body mass index: 47.6 OSA with hx of CPAP FEN - NPO, TPN VTE - SCDs, lovenox ID - IV Zosyn 8/30-01/23/17 Vancomycin 9/6-01/23/17  Flagyl/cefipime 9/7 =>> day 4  Plan:  For IR drain of the left exudative pleural effusion and we are also going to ask that they drain the perihepatic site also.       LOS: 11 days    Synda Bagent 01/26/2017 [phone]

## 2017-01-26 NOTE — Progress Notes (Signed)
Pt refuses cpap at this time.  RT will monitor. 

## 2017-01-26 NOTE — Progress Notes (Signed)
Pharmacy Antibiotic Note  Albert White is a 41 y.o. male admitted on 01/15/2017 with C. freundii peritonitis/intra-abdominal abscess.  Pharmacy has been consulted for Cefepime and Flagyl dosing. WBC wnl. SCr stable.   Plan: Continue Cefepime 2 g IV every 8 hours.  Continue Metronidazole 500 mg IV every 8 hours.  F/U C&S, abx deescalation / LOT   Height: 5\' 10"  (177.8 cm) Weight: (!) 340 lb 12.8 oz (154.6 kg) IBW/kg (Calculated) : 73  Temp (24hrs), Avg:98 F (36.7 C), Min:97.9 F (36.6 C), Max:98.2 F (36.8 C)   Recent Labs Lab 01/21/17 0455 01/22/17 0553 01/23/17 1248 01/24/17 0501 01/25/17 0458 01/26/17 0433  WBC 6.0 5.3 6.9  --  4.7 5.5  CREATININE 0.53* 0.53* 0.60* 0.48* 0.46* 0.48*    Estimated Creatinine Clearance: 183.3 mL/min (A) (by C-G formula based on SCr of 0.48 mg/dL (L)).    No Known Allergies  Antimicrobials this admission: Zosyn 8/30 >> 9/7 Vancomycin 9/6 >>9/7 Cefepime 9/7 >> Flagyl 9/7 >>  Dose adjustments this admission: n/a  Microbiology results: 8/30 MRSA PCR - negative 8/30 BCx - negative 8/31 abd abscess cx - Gardnerella vaginalis, Strep constellatus 9/5 abscess cx - C.freundii (S cefepime, Cipro, Primaxin, Septra) - holding for anaerobes 9/7 pleural fluid cx - MGTD  Thank you for allowing pharmacy to be a part of this patient's care.  Link SnufferJessica Taneka Espiritu, PharmD, BCPS Clinical Pharmacist Clinical phone 01/26/2017 until 3:30PM [phone]- [phone] After hours, please call [phone] 01/26/2017 1:19 PM

## 2017-01-26 NOTE — Progress Notes (Signed)
      [address]       [place] [id number]             [phone]      Feels about the same  BP 134/78 (BP Location: Left Arm)   Pulse 93   Temp 97.9 F (36.6 C) (Oral)   Resp 18   Ht 5\' 10"  (1.778 m)   Wt (!) 340 lb 12.8 oz (154.6 kg)   SpO2 97%   BMI 48.90 kg/m    Intake/Output Summary (Last 24 hours) at 01/26/17 1717 Last data filed at 01/26/17 1602  Gross per 24 hour  Intake          1908.25 ml  Output             1570 ml  Net           338.25 ml   To have another abdominal drain and pleural drain placed tomorrow  Viviann SpareSteven C. Dorris FetchHendrickson, MD [facility] [phone]

## 2017-01-26 NOTE — Progress Notes (Signed)
PULMONARY / CRITICAL CARE MEDICINE   Name: Albert PetrinSteven T White MRN: [id number] DOB: March 17, 1976    ADMISSION DATE:  01/15/2017 CONSULTATION DATE:  01/25/2017  REFERRING MD:  Dr. Dorris FetchHendrickson  CHIEF COMPLAINT:  Pleural effusion  brief 41 yo male [facility]  He developed scrotal swelling and pain.  Found to have hydrocele and varicocele.  He was then found to have abdominal wall hematoma, fever 103F, and seroma.  He had CT abd which showed abscess.  [facility] and seen by surgery.  He had drain placed by IR and started on Abx.  He was noted to have b/l pleural effusions.  He had attempted thoracentesis with only 25 ml of fluid able to be removed.  He was seen by thoracic surgery.  PAST MEDICAL HISTORY :  He  has a past medical history of Abscess; Diabetes mellitus without complication (HCC); Hidradenitis suppurativa; Hyperlipemia; Morbid obesity (HCC); and Vertigo.  PAST SURGICAL HISTORY: He  has a past surgical history that includes Incision and drainage deep neck abscess; Incision and drainage abscess (Right, 12/15/2013); and IR THORACENTESIS ASP PLEURAL SPACE W/IMG GUIDE (01/23/2017).   EVENTS 01/15/2017 - admit 9/9- - pulm consult   STUDIES:  Lt pleural fluid 9/07 >> glucose 130, protein 3.8, LDH 453, WBC 3750 (54 N, 43 L) CT chest 9/08 >> lower lobe consolidation b/l, mod Lt effusion, small Rt effusion  CULTURES: Blood 8/30 >> Abdominal abscess 8/31 >> Streptococcus constellatus, gardnerella vaginalis Abdominal abscess 9/05 >> citrobacter freundii, viridans streptococcus Lt pleural fluid 9/07 >>  ANTIBIOTICS: Zosyn 8/30 >> 9/05 Vancomycin 9/05 >> 9/06 Cefepime 9/07 >> Flagyl 9/07 >>   SIGNIFICANT EVENTS: 8/31 Admit, surgery consulted, abscess drain by IR 9/04 2nd drain by IR 9/07 Lt thoracentesis  LINES/TUBES: Rt PICC 8/31 >>   SUBJECTIVE/OVERNIGHT/INTERVAL HX 01/26/17 - he and wife say they are on board for IR guided  pig tail with thrombolytic. Denies acute complaints  VITAL SIGNS: BP 134/78 (BP Location: Left Arm)   Pulse 93   Temp 97.9 F (36.6 C) (Oral)   Resp 18   Ht 5\' 10"  (1.778 m)   Wt (!) 154.6 kg (340 lb 12.8 oz)   SpO2 97%   BMI 48.90 kg/m   INTAKE / OUTPUT: I/O last 3 completed shifts: In: 2208.3 [P.O.:420; I.V.:1498.3; Other:20; IV Piggyback:270] Out: [id number] [Urine:3475; Drains:190]  PHYSICAL EXAMINATION:   General Appearance:    Looks well.  OBESE - yes  Head:    Normocephalic, without obvious abnormality, atraumatic  Eyes:    PERRL - yes, conjunctiva/corneas - clear      Ears:    Normal external ear canals, both ears  Nose:   NG tube - no  Throat:  ETT TUBE - no , OG tube - no  Neck:   Supple,  No enlargement/tenderness/nodules     Lungs:     Clear to auscultation bilaterally,   Chest wall:    No deformity  Heart:    S1 and S2 normal, no murmur,  Abdomen:     Soft, no masses, no organomegaly  Genitalia:    Not done  Rectal:   not done  Extremities:   Extremities- no cyannosis, no clubing, no edema     Skin:   Intact in exposed areas .      Neurologic:   Sedation - none -> RASS - +1 . Moves all 4s - yes. CAM-ICU - neg for delirium .  Orientation - x3+      LABS:  BMET  Recent Labs Lab 01/24/17 0501 01/25/17 0458 01/26/17 0433  NA 133* 135 133*  K 3.8 4.0 4.0  CL 97* 100* 98*  CO2 BUN CREATININE 0.48* 0.46* 0.48*  GLUCOSE 169* 160* 137*    Electrolytes  Recent Labs Lab 01/22/17 0553  01/24/17 0501 01/25/17 0458 01/26/17 0433  CALCIUM 8.1*  < > 8.1* 8.2* 8.3*  MG 1.7  --  1.7  --  1.7  PHOS 5.3*  --  3.9  --  4.7*  < > = values in this interval not displayed.  CBC  Recent Labs Lab 01/23/17 1248 01/25/17 0458 01/26/17 0433  WBC 6.9 4.7 5.5  HGB 9.4* 8.3* 8.2*  HCT 31.2* 27.9* 27.9*  PLT 401* 418* 432*    Coag's  Recent Labs Lab 01/20/17 1744 01/23/17 0723  INR 1.19 1.12    Sepsis Markers No results for  input(s): LATICACIDVEN, PROCALCITON, O2SATVEN in the last 168 hours.  ABG No results for input(s): PHART, PCO2ART, PO2ART in the last 168 hours.  Liver Enzymes  Recent Labs Lab 01/22/17 0553 01/23/17 1248 01/26/17 0433  AST ALT 18 19 15*  ALKPHOS 139* 175* 116  BILITOT 0.6 0.8 0.4  ALBUMIN 1.6* 1.9* 1.9*    Cardiac Enzymes No results for input(s): TROPONINI, PROBNP in the last 168 hours.  Glucose  Recent Labs Lab 01/25/17 1654 01/25/17 2025 01/25/17 2351 01/26/17 0359 01/26/17 0738 01/26/17 1232  GLUCAP 135* 118* 135* 134* 147* 137*    Imaging Dg Chest Port 1 View  Result Date: 01/26/2017 CLINICAL DATA:  Pleural effusion EXAM: PORTABLE CHEST 1 VIEW COMPARISON:  01/23/2017 FINDINGS: Stable right upper extremity PICC. Stable bilateral pleural effusions and bilateral predominately basilar airspace disease left greater than right. No pneumothorax. IMPRESSION: Stable bilateral pleural effusions and bilateral airspace disease. Electronically Signed   By: Jolaine Click M.D.   On: 01/26/2017 07:20    ASSESSMENT / PLAN:  Exudative Lt pleural effusion. -reviewed chart and agree with prior recommendation for IR guided pig tail +/-  thrombolytic.   Patient and wife on board   Dr. Kalman Shan, M.D., [facility].C.P Pulmonary and Critical Care Medicine Staff Physician [facility] Pager: [phone], If no answer or between  15:00h - 7:00h: call [phone]  01/26/2017 1:57 PM

## 2017-01-26 NOTE — Progress Notes (Signed)
PHARMACY - ADULT TOTAL PARENTERAL NUTRITION CONSULT NOTE   Pharmacy Consult:  TPN Indication:  Prolonged NPO  Patient Measurements: Height: _0  (177.8 cm) Weight: (!) 340 lb 12.8 oz (154.6 kg) IBW/kg (Calculated) : 73 TPN AdjBW (KG): 93.4 Body mass index is 48.9 kg/m.  Assessment:  41 year old obese male s/p laparoscopic Roux-en-Y/gastric bypass surgery on 12/08/16 and hospitalization 8/14 through 01/10/17 for anasarca, abdominal pain, fever, and postoperative seroma with percutaneous drain placement now re-admitted with continued abdominal pain, occasional nausea, non-bloddy emesis, weight loss (on Lasix), and decreased appetite. CT revealed multiple large gas containing intra-abdominal abscesses concerning for dehiscence.   GI: anasarca/vol overload with perc drain placed 8/31 and 9/5, O/P 20m.  Prealbumin increasing at 11.1, LBM 9/8.  Pepcid in TPN, PRN Zofran, Anusol-HC for hemorrhoids *surgical G-tube eventually per Surgery, possible laparoscopy next week Endo: DM2 on metformin PTA - CBGs < 180 Insulin requirements in past 24 hrs: 16 units rSSI + 25 units in TPN Lytes: Low Na/Cl. Phos up at 4.7, CoCa  9.98 (CaxPhos <55). Mg 1.7 (1g given on 9/9).  *days of TPN without lytes: 9/7; 9/10 Renal: SCr 0.48 stable, BUN WNL - UOP 0.8 ml/kg/hr, NS 35 ml/hr Pulm: effusion - s/p thoracentesis 9/7, plan for catheter drainage +/- thrombolytics. On 2L [place] (CPAP at home), Cards: HLD - VSS Hepatobil:  LFTs/ tbili / TG WNL.  Alk Phos trended down to wnl.  Neuro: pain score 5-9, Robaxin Q8H, PRN Oxycodone/Dilaudid.  Patient appears comfortable. ID: [id number] - Cefepime/Flagyl for C.freundii peritonitis/intra-abd abscess.  Rare gardnerella/Strep constellatus in abdominal abscess cx.  Afebrile, WBC WNL  Zosyn 8/30 >> 9/7 Vanc 9/6 >> 9/7 Cefepime 9/7 >> Flagyl 9/7 >>  8/30 MRSA PCR - negative 8/30 BCx - negative 8/31 abd abscess cx - Gardnerella vaginalis, Strep constellatus 9/5 abscess cx -  C.freundii (S cefepime, Cipro, Primaxin, Septra) - holding for anaerobes 9/7 pleural fluid cx - MGTD  Best Practices: Lovenox 40 BID TPN Access: double lumen PICC placed 01/16/17 TPN start date: 01/16/17  Nutritional Goals (per RD recommendation 8/31): 2300-2500 kCal and 115-130 gm protein per day   Current Nutrition:  Clinimix E 5/15 at 83 ml/hr + ILE at 20 ml/hr x 12 hrs = 1894 kCal and 100gm protein per day Bariatric clear liquid diet (10% of meal) Premier Protein 2 oz QID (all 4 doses yesterday, totaling 8 oz) = 116 calories, 21.8gm protein (also provides some Phos, Mg, and Iron)  *Note holding liquid diet and Premier Protein 9/10 due to surgery.    Plan:  - Continue Clinimix 5/15 to 83 ml/hr + continue 20% ILE at 20 ml/hr x 12 hrs.   - TPN + shakes (not including PO intake) will provide 2010 kCal and 122gm of protein per day, meeting 87% of minimal kCal and meeting 100% of protein needs.  - Daily multivitamin and trace elements in TPN - Continue resistant SSI Q4H + continue insulin in TPN to 25 units  - Pepcid 47mdaily in TPN  - Repeat Mag sulfate 1gm IV x 1 - F/U AM labs, iron supplementation when possible - Watch Phos   JeSloan LeiterPharmD, BCPS Clinical Pharmacist Clinical phone 01/26/2017 until 3:30PM - - #[phone] hours, please call #[phone]/02/2017, 10:27 AM

## 2017-01-26 NOTE — Progress Notes (Signed)
Referring Physician(s): Dr. Andrey Campanile  Supervising Physician: Ruel Favors  Patient Status:  [facility] - In-pt  Chief Complaint:  Abdominal abscess s/p RLQ drain placement 8/31 by Dr. Deanne Coffer, epigastric drain by Dr. Elmon Kirschner 9/5  Subjective: Patient continues with abdominal pain.  Patient and wife with several questions re: plan which are answered as able.   Allergies: Patient has no known allergies.  Medications: Prior to Admission medications   Medication Sig Start Date End Date Taking? Authorizing Provider  acetaminophen (TYLENOL) 325 MG tablet Take 2 tablets (650 mg total) by mouth every 8 (eight) hours. Patient taking differently: Take 650 mg by mouth every 8 (eight) hours as needed for moderate pain or headache.  02/27/16  Yes Mesner, Barbara Cower, MD  atorvastatin (LIPITOR) 10 MG tablet Take 10 mg by mouth every evening.  09/18/15  Yes [provider]  calcium carbonate (TUMS - DOSED IN MG ELEMENTAL CALCIUM) 500 MG chewable tablet Chew 500 mg by mouth as needed for heartburn.   Yes [provider]  nystatin (MYCOSTATIN) 100000 UNIT/ML suspension Take by mouth as needed. Thrush. 1 tsp 11/27/16  Yes [provider]  omeprazole (PRILOSEC) 20 MG capsule Take 20 mg by mouth 2 (two) times daily. 11/27/16  Yes [provider]  OVER THE COUNTER MEDICATION Apply 1 application topically as needed (eczema). [employer] Eczema Cream    Yes [provider]  oxyCODONE (ROXICODONE) 5 MG immediate release tablet Take 1 tablet (5 mg total) by mouth every 6 (six) hours as needed for severe pain. 03/05/16  Yes Ghimire, Werner Lean, MD  traZODone (DESYREL) 100 MG tablet Take 100 mg by mouth at bedtime.   Yes [provider]  ursodiol (ACTIGALL) 300 MG capsule Take 300 mg by mouth 2 (two) times daily. 11/27/16  Yes [provider]  cephALEXin (KEFLEX) 500 MG capsule Take 1 capsule (500 mg total) by mouth 3 (three) times daily. Patient not taking: Reported on  01/15/2017 03/05/16   Maretta Bees, MD  doxycycline (VIBRA-TABS) 100 MG tablet Take 1 tablet (100 mg total) by mouth 2 (two) times daily. Patient not taking: Reported on 01/15/2017 03/05/16   Maretta Bees, MD  metFORMIN (GLUCOPHAGE) 500 MG tablet Take 2 tablets (1,000 mg total) by mouth 2 (two) times daily with a meal. Patient not taking: Reported on 01/15/2017 03/05/16   Maretta Bees, MD     Vital Signs: BP 134/78 (BP Location: Left Arm)   Pulse 93   Temp 97.9 F (36.6 C) (Oral)   Resp 18   Ht  (1.778 m)   Wt (!) 340 lb 12.8 oz (154.6 kg)   SpO2 97%   BMI 48.90 kg/m   Physical Exam  Constitutional: He appears well-developed.  Cardiovascular: Normal rate and regular rhythm.   Pulmonary/Chest: Effort normal and breath sounds normal.  Abdominal:  Epigastric and RLQ drain in place. C/d/i.  Abdomen tender  Nursing note and vitals reviewed.   Imaging: Dg Chest 1 View  Result Date: 01/23/2017 CLINICAL DATA:  Status post left-sided thoracentesis EXAM: CHEST 1 VIEW COMPARISON:  January 22, 2017 FINDINGS: No pneumothorax. There remains moderate at least partially loculated pleural effusion on the left. There is a small pleural effusion on the right. There is bibasilar atelectasis. Heart is borderline enlarged with pulmonary venous hypertension. There is no frank edema or consolidation. Central catheter tip is in the superior vena cava. No adenopathy evident. No bone lesions. IMPRESSION: No pneumothorax following thoracentesis. Moderate pleural effusion,  at least partially loculated, on the left remains. Small right pleural effusion present. Bibasilar atelectasis. Underlying pulmonary vascular congestion. Electronically Signed   By: Bretta Bang III M.D.   On: 01/23/2017 12:33   Ct Chest Wo Contrast  Result Date: 01/24/2017 CLINICAL DATA:  Left pleural effusion status post thoracentesis EXAM: CT CHEST WITHOUT CONTRAST TECHNIQUE: Multidetector CT imaging of the chest  was performed following the standard protocol without IV contrast. COMPARISON:  Radiograph 01/23/2017, CT abdomen pelvis 01/19/2017, 01/15/2017 FINDINGS: Cardiovascular: Limited evaluation without intravenous contrast. Non aneurysmal aorta. Right-sided central venous catheter tip is at the brachiocephalic confluence. Normal heart size. No significant pericardial effusion Mediastinum/Nodes: Midline trachea. No thyroid mass. No significantly enlarged mediastinal lymph nodes. Limited assessment of hilar adenopathy. Esophagus within normal limits. Lungs/Pleura: Partial lower lobe consolidations may reflect atelectasis or pneumonia. Moderate left pleural effusion with some loculation anteriorly. Small right-sided pleural effusion. Negative for a pneumothorax Upper Abdomen: Interval placement of pigtail drainage catheter adjacent to the bypassed stomach with decreased abscess collection since previous CT. Grossly stable perisplenic fluid collection measuring approximately 11.3 x 2.5 cm on axial images. Interval increase in a right perihepatic fluid collection, this measures 15 cm x 6.8 cm by 16.3 cm. Mass effect on the underlying liver. Musculoskeletal: No acute or suspicious bone lesion IMPRESSION: 1. Small right pleural effusion and moderate left pleural effusion with some loculation of the pleural fluid on the left. Negative for a pneumothorax. Partial consolidations in the lower lobes may reflect atelectasis or pneumonia 2. Interim placement of a pigtail drainage catheter adjacent to the gastric sutures/gastro jejunal anastomosis with resolution of previously noted gas and fluid collection 3. Interval increase in size of a right perihepatic fluid collection since the prior CT, measuring at least 16.3 cm. 4. Grossly stable left perisplenic fluid collection Electronically Signed   By: Jasmine Pang M.D.   On: 01/24/2017 19:52   Dg Chest Port 1 View  Result Date: 01/26/2017 CLINICAL DATA:  Pleural effusion EXAM:  PORTABLE CHEST 1 VIEW COMPARISON:  01/23/2017 FINDINGS: Stable right upper extremity PICC. Stable bilateral pleural effusions and bilateral predominately basilar airspace disease left greater than right. No pneumothorax. IMPRESSION: Stable bilateral pleural effusions and bilateral airspace disease. Electronically Signed   By: Jolaine Click M.D.   On: 01/26/2017 07:20   Dg Kayleen Memos W/water Sol Cm  Result Date: 01/23/2017 CLINICAL DATA:  Inpatient. 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 Status post right lower quadrant percutaneous drain placement 01/16/2017. Status post left upper quadrant percutaneous drain placement 01/21/2017. EXAM: WATER SOLUBLE UPPER GI SERIES TECHNIQUE: Single-column upper GI series was performed using water soluble contrast. CONTRAST:  75 cc Isovue-300 oral contrast. COMPARISON:  01/19/2017 CT abdomen/pelvis. FLUOROSCOPY TIME:  Fluoroscopy Time:  3 minutes 42 seconds. Number of Acquired Spot Images: 15 FINDINGS: Percutaneous pigtail drainage catheter terminates in the medial left upper quadrant. Surgical sutures are also noted in the medial left upper quadrant. Esophagus is normal in distensibility with no gross esophageal filling defects or strictures. Patient is status post gastric bypass surgery. The small gastric pouch appears intact with no filling defects or ulcers. The gastrojejunostomy appears intact with no evidence of anastomotic leak or significant narrowing at the anastomosis. Contrast passes into the normal caliber jejunal limb and proximal small bowel loops without delay. No evidence of oral contrast in the excluded distal stomach . IMPRESSION: No evidence of anastomotic leak or stricture at the gastrojejunal anastomosis. Intact gastric pouch without appreciable  complication. Electronically Signed   By: Delbert Phenix M.D.   On: 01/23/2017 12:02   Ir Thoracentesis Asp Pleural Space W/img Guide  Result Date:  01/23/2017 INDICATION: Status post gastric bypass surgery. Now with multiple intra-abdominal fluid collection that have been drained. Left pleural effusion noted on recent imaging with some dyspnea. Request is made for diagnostic and therapeutic thoracentesis. EXAM: ULTRASOUND GUIDED LEFT THORACENTESIS MEDICATIONS: 1% xylocaine COMPLICATIONS: None immediate. PROCEDURE: An ultrasound guided thoracentesis was thoroughly discussed with the patient and questions answered. The benefits, risks, alternatives and complications were also discussed. The patient understands and wishes to proceed with the procedure. Written consent was obtained. Ultrasound was performed to localize and mark an adequate pocket of fluid in the left chest. The area was then prepped and draped in the normal sterile fashion. 1% Lidocaine was used for local anesthesia. Under ultrasound guidance a Safe-T-Centesis catheter was introduced. Thoracentesis was performed. The catheter was removed and a dressing applied. FINDINGS: A total of approximately 25 cc of serosanguineous fluid was removed. Samples were sent to the laboratory as requested by the clinical team. The fluid appeared to have some complexity to it. The right side was imaged as well and no effusion was noted. IMPRESSION: Successful ultrasound guided left thoracentesis yielding 25 cc of pleural fluid. Read by: Barnetta Chapel, PA-C Electronically Signed   By: Malachy Moan M.D.   On: 01/23/2017 13:24    Labs:  CBC:  Recent Labs  01/22/17 0553 01/23/17 1248 01/25/17 0458 01/26/17 0433  WBC 5.3 6.9 4.7 5.5  HGB 8.9* 9.4* 8.3* 8.2*  HCT 29.3* 31.2* 27.9* 27.9*  PLT 352 401* 418* 432*    COAGS:  Recent Labs  01/15/17 1420 01/20/17 1744 01/23/17 0723  INR 1.07 1.19 1.12    BMP:  Recent Labs  01/23/17 1248 01/24/17 0501 01/25/17 0458 01/26/17 0433  NA 135 133* 135 133*  K 4.2 3.8 4.0 4.0  CL 97* 97* 100* 98*  CO2 GLUCOSE 199* 169* 160* 137*    BUN CALCIUM 8.6* 8.1* 8.2* 8.3*  CREATININE 0.60* 0.48* 0.46* 0.48*  GFRNONAA >60 >60 >60 >60  GFRAA >60 >60 >60 >60    LIVER FUNCTION TESTS:  Recent Labs  01/19/17 0516 01/22/17 0553 01/23/17 1248 01/26/17 0433  BILITOT 0.3 0.6 0.8 0.4  AST ALT 11* 18 19 15*  ALKPHOS 116 139* 175* 116  PROT 5.7* 5.7* 6.7 6.4*  ALBUMIN 1.6* 1.6* 1.9* 1.9*    Assessment and Plan: Abdominal abscess s/p RLQ drain placement by Dr. Deanne Coffer 01/16/17, peri-epigastric drain placement 9/5 by Dr. Grace Isaac.  CT Abdomen 9/8 shows: 1. Small right pleural effusion and moderate left pleural effusion with some loculation of the pleural fluid on the left. Negative for a pneumothorax. Partial consolidations in the lower lobes may reflect atelectasis or pneumonia 2. Interim placement of a pigtail drainage catheter adjacent to the gastric sutures/gastro jejunal anastomosis with resolution of previously noted gas and fluid collection 3. Interval increase in size of a right perihepatic fluid collection since the prior CT, measuring at least 16.3 cm. 4. Grossly stable left perisplenic fluid collection Case discussed with Dr. Miles Costain who has been in conference with medical team today.  Plan is for pleural drain and hepatic abscess aspiration and drainage.  Patient and wife are agreeable.  Risks and benefits discussed with the patient including bleeding, infection, damage to adjacent structures, bowel perforation/fistula connection, and sepsis.  All of the patient's questions were answered, patient is agreeable to proceed. Consent signed and in chart. RLQ drain output has been minimal.  Will discuss plans for drain with MD.   Electronically Signed: Hoyt KochKacie Sue-Ellen Cissy Galbreath, PA 01/26/2017, 4:38 PM   I spent a total of 15 Minutes at the the patient's bedside AND on the patient's hospital floor or unit, greater than 50% of which was counseling/coordinating care for multiple abscesses.

## 2017-01-27 ENCOUNTER — Inpatient Hospital Stay (HOSPITAL_COMMUNITY): Payer: 59

## 2017-01-27 DIAGNOSIS — K9189 Other postprocedural complications and disorders of digestive system: Secondary | ICD-10-CM

## 2017-01-27 DIAGNOSIS — L0291 Cutaneous abscess, unspecified: Secondary | ICD-10-CM

## 2017-01-27 DIAGNOSIS — T814XXA Infection following a procedure, initial encounter: Principal | ICD-10-CM

## 2017-01-27 DIAGNOSIS — K659 Peritonitis, unspecified: Secondary | ICD-10-CM

## 2017-01-27 DIAGNOSIS — Y838 Other surgical procedures as the cause of abnormal reaction of the patient, or of later complication, without mention of misadventure at the time of the procedure: Secondary | ICD-10-CM

## 2017-01-27 LAB — CBC
HCT: 27.3 % — ABNORMAL LOW (ref 39.0–52.0)
Hemoglobin: 8.2 g/dL — ABNORMAL LOW (ref 13.0–17.0)
MCH: 25.6 pg — ABNORMAL LOW (ref 26.0–34.0)
MCHC: 30 g/dL (ref 30.0–36.0)
MCV: 85.3 fL (ref 78.0–100.0)
Platelets: 444 10*3/uL — ABNORMAL HIGH (ref 150–400)
RBC: 3.2 MIL/uL — AB (ref 4.22–5.81)
RDW: 16.3 % — AB (ref 11.5–15.5)
WBC: 5.4 10*3/uL (ref 4.0–10.5)

## 2017-01-27 LAB — PHOSPHORUS: Phosphorus: 4.4 mg/dL (ref 2.5–4.6)

## 2017-01-27 LAB — BASIC METABOLIC PANEL
ANION GAP: 7 (ref 5–15)
BUN: 8 mg/dL (ref 6–20)
CALCIUM: 8.1 mg/dL — AB (ref 8.9–10.3)
CHLORIDE: 98 mmol/L — AB (ref 101–111)
CO2: 28 mmol/L (ref 22–32)
Creatinine, Ser: 0.48 mg/dL — ABNORMAL LOW (ref 0.61–1.24)
GFR calc non Af Amer: >60 mL/min (ref >60)
Glucose, Bld: 158 mg/dL — ABNORMAL HIGH (ref 65–99)
Potassium: 4 mmol/L (ref 3.5–5.1)
SODIUM: 133 mmol/L — AB (ref 135–145)

## 2017-01-27 LAB — MAGNESIUM: MAGNESIUM: 1.7 mg/dL (ref 1.7–2.4)

## 2017-01-27 LAB — ALBUMIN: ALBUMIN: 1.7 g/dL — AB (ref 3.5–5.0)

## 2017-01-27 LAB — GLUCOSE, CAPILLARY
GLUCOSE-CAPILLARY: 121 mg/dL — AB (ref 65–99)
GLUCOSE-CAPILLARY: 144 mg/dL — AB (ref 65–99)
Glucose-Capillary: 145 mg/dL — ABNORMAL HIGH (ref 65–99)
Glucose-Capillary: 150 mg/dL — ABNORMAL HIGH (ref 65–99)
Glucose-Capillary: 142 mg/dL — ABNORMAL HIGH (ref 65–99)

## 2017-01-27 LAB — LACTATE DEHYDROGENASE: LDH: 211 U/L — ABNORMAL HIGH (ref 98–192)

## 2017-01-27 MED ORDER — LIDOCAINE HCL (PF) 1 % IJ SOLN
INTRAMUSCULAR | Status: AC
  Filled 2017-01-27: qty 30

## 2017-01-27 MED ORDER — TRACE MINERALS CR-CU-MN-SE-ZN 10-1000-500-60 MCG/ML IV SOLN
INTRAVENOUS | Status: AC
  Administered 2017-01-27: 18:00:00 via INTRAVENOUS
  Filled 2017-01-27: qty 2400

## 2017-01-27 MED ORDER — MIDAZOLAM HCL 2 MG/2ML IJ SOLN
INTRAMUSCULAR | Status: AC | PRN
  Administered 2017-01-27 (×3): 1 mg via INTRAVENOUS

## 2017-01-27 MED ORDER — FENTANYL CITRATE (PF) 100 MCG/2ML IJ SOLN
INTRAMUSCULAR | Status: AC | PRN
  Administered 2017-01-27 (×4): 50 ug via INTRAVENOUS

## 2017-01-27 MED ORDER — HYDROMORPHONE HCL 1 MG/ML IJ SOLN
1.0000 mg | INTRAMUSCULAR | Status: DC | PRN
  Administered 2017-01-27 – 2017-01-29 (×20): 2 mg via INTRAVENOUS
  Filled 2017-01-27 (×19): qty 2

## 2017-01-27 MED ORDER — SODIUM CHLORIDE 0.9 % IV SOLN
1.0000 g | INTRAVENOUS | Status: DC
  Administered 2017-01-27 – 2017-02-01 (×6): 1 g via INTRAVENOUS
  Filled 2017-01-27 (×8): qty 1

## 2017-01-27 MED ORDER — MIDAZOLAM HCL 2 MG/2ML IJ SOLN
INTRAMUSCULAR | Status: AC
  Filled 2017-01-27: qty 4

## 2017-01-27 MED ORDER — FENTANYL CITRATE (PF) 100 MCG/2ML IJ SOLN
INTRAMUSCULAR | Status: AC
  Filled 2017-01-27: qty 4

## 2017-01-27 MED ORDER — MAGNESIUM SULFATE 2 GM/50ML IV SOLN
2.0000 g | Freq: Once | INTRAVENOUS | Status: AC
  Administered 2017-01-27: 2 g via INTRAVENOUS
  Filled 2017-01-27: qty 50

## 2017-01-27 MED ORDER — FAT EMULSION 20 % IV EMUL
240.0000 mL | INTRAVENOUS | Status: AC
  Administered 2017-01-27: 240 mL via INTRAVENOUS
  Filled 2017-01-27: qty 250

## 2017-01-27 MED ORDER — SODIUM CHLORIDE 0.9% FLUSH
10.0000 mL | INTRAVENOUS | Status: DC | PRN
  Administered 2017-01-27 – 2017-01-30 (×2): 10 mL
  Administered 2017-01-30: 20 mL
  Administered 2017-02-01 – 2017-02-02 (×3): 10 mL
  Filled 2017-01-27 (×5): qty 40

## 2017-01-27 NOTE — Progress Notes (Addendum)
PHARMACY - ADULT TOTAL PARENTERAL NUTRITION CONSULT NOTE   Pharmacy Consult:  TPN Indication:  Prolonged NPO  Patient Measurements: Height: '5\' 10"'  (177.8 cm) Weight: (!) 340 lb 12.8 oz (154.6 kg) IBW/kg (Calculated) : 73 TPN AdjBW (KG): 93.4 Body mass index is 48.9 kg/m.  Assessment:  41 year old obese male s/p laparoscopic Roux-en-Y/gastric bypass surgery on 12/08/16 and hospitalization 8/14 through 01/10/17 for anasarca, abdominal pain, fever, and postoperative seroma with percutaneous drain placement now re-admitted with continued abdominal pain, occasional nausea, non-bloddy emesis, weight loss (on Lasix), and decreased appetite. CT revealed multiple large gas containing intra-abdominal abscesses concerning for dehiscence.   GI: anasarca/vol overload with perc drain placed 8/31 and 9/5, O/P 72m.  Prealbumin increasing at 11.1, LBM 9/8.  Pepcid in TPN, PRN Zofran, Anusol-HC for hemorrhoids To have another abdominal drain and pleural drain placed today  Pt was tolerating premier protein and liquid diet - but this has been held as pt is planning to return to surgery Endo: DM2 on metformin PTA - CBGs < 180 Insulin requirements in past 24 hrs: 6 units rSSI + 25 units in TPN Lytes: Low Na/Cl. Phos 4.4, CoCa  9.7 (CaxPhos <55). Mg still 1.7 - repleting with 2 g today *days of TPN without lytes: 9/7; 9/10 Renal: SCr 0.48 stable, BUN WNL - UOP 0.8 ml/kg/hr, NS 35 ml/hr Pulm: effusion - s/p thoracentesis 9/7, plan for catheter drainage +/- thrombolytics. On 2L [place] (CPAP at home), Cards: HLD - VSS Hepatobil:  LFTs/ tbili / TG WNL.  Alk Phos trended down to wnl.  Neuro: pain score 5-9, Robaxin Q8H, PRN Oxycodone/Dilaudid.  Patient appears comfortable. ID: Cefepime/Flagyl for C.freundii peritonitis/intra-abd abscess.  Rare gardnerella/Strep constellatus in abdominal abscess cx.  Afebrile, WBC WNL  Zosyn 8/30 >> 9/7 Vanc 9/6 >> 9/7 Cefepime 9/7 >> Flagyl 9/7 >>  8/30 MRSA PCR - negative 8/30  BCx - negative 8/31 abd abscess cx - Gardnerella vaginalis, Strep constellatus 9/5 abscess cx - C.freundii (S cefepime, Cipro, Primaxin, Septra) - holding for anaerobes 9/7 pleural fluid cx - MGTD  Best Practices: Lovenox 40 BID TPN Access: double lumen PICC placed 01/16/17 TPN start date: 01/16/17  Nutritional Goals (per RD recommendation 8/31): 2300-2500 kCal and 115-130 gm protein per day   Current Nutrition:  Clinimix E 5/15 at 83 ml/hr + ILE at 20 ml/hr x 12 hrs = 1894 kCal and 100gm protein per day   Plan:  - Increase Clinimix 5/15 to 100 ml/hr + continue 20% ILE at 20 ml/hr x 12 hrs   - TPN + ILE will provide 2184 kCal and 120 g of protein per day, meeting 95 % of minimal kCal needs and 100% of protein goal  - Daily multivitamin and trace elements in TPN - Continue resistant SSI Q4H + insulin in TPN at 25 units  - Pepcid 457mdaily in TPN  - Mag sulfate 2 g IV x1 - F/u diet advancement after procedure  - Increased TPN rate only temporarily while pt is not taking premier protein nor liquid diet - F/u duration of abx   AlHughes BetterPharmD, BCPS Clinical Pharmacist 01/27/2017 10:18 AM

## 2017-01-27 NOTE — Sedation Documentation (Signed)
Patient is resting comfortably. 

## 2017-01-27 NOTE — Progress Notes (Signed)
Patient ID: Albert White, male   DOB: 30-Jan-1976, 41 y.o.   MRN: [id number]     Subjective: Up walking in the halls. Some abdominal pain but no worse. Pain medicine was decreased yesterday and not controlling quite as well. No new complaints.  Objective: Vital signs in last 24 hours: Temp:  [97.9 F (36.6 C)-98.2 F (36.8 C)] 98.2 F (36.8 C) (09/11 0507) Pulse Rate:  [96-97] 97 (09/11 0507) Resp:  [18] 18 (09/11 0507) BP: (133-138)/(80-84) 133/80 (09/11 0507) SpO2:  [95 %-100 %] 95 % (09/11 0507) Last BM Date: 01/26/17  Intake/Output from previous day: 09/10 0701 - 09/11 0700 In: 3477 [I.V.:3087; IV Piggyback:370] Out: 2745 [Urine:2650; Drains:95] Intake/Output this shift: Total I/O In: -  Out: 500 [Urine:500]  General appearance: alert, cooperative and no distress GI: obese. Mild upper abdominal tenderness. Some edema both flanks. Incision/Wound: Drains in place with cloudy fluid  Lab Results:   Recent Labs  01/26/17 0433 01/27/17 0551  WBC 5.5 5.4  HGB 8.2* 8.2*  HCT 27.9* 27.3*  PLT 432* 444*   BMET  Recent Labs  01/26/17 0433 01/27/17 0551  NA 133* 133*  K 4.0 4.0  CL 98* 98*  CO2 28 28  GLUCOSE 137* 158*  BUN 9 8  CREATININE 0.48* 0.48*  CALCIUM 8.3* 8.1*     Studies/Results: Dg Chest Port 1 View  Result Date: 01/26/2017 CLINICAL DATA:  Pleural effusion EXAM: PORTABLE CHEST 1 VIEW COMPARISON:  01/23/2017 FINDINGS: Stable right upper extremity PICC. Stable bilateral pleural effusions and bilateral predominately basilar airspace disease left greater than right. No pneumothorax. IMPRESSION: Stable bilateral pleural effusions and bilateral airspace disease. Electronically Signed   By: Jolaine ClickArthur  Hoss M.D.   On: 01/26/2017 07:20    Anti-infectives: Anti-infectives    Start     Dose/Rate Route Frequency Ordered Stop   01/23/17 1500  metroNIDAZOLE (FLAGYL) IVPB 500 mg     500 mg 100 mL/hr over 60 Minutes Intravenous Every 8 hours 01/23/17 1353     01/23/17 1500  ceFEPIme (MAXIPIME) 2 g in dextrose 5 % 50 mL IVPB     2 g 100 mL/hr over 30 Minutes Intravenous Every 8 hours 01/23/17 1357     01/22/17 2000  vancomycin (VANCOCIN) 1,250 mg in sodium chloride 0.9 % 250 mL IVPB  Status:  Discontinued     1,250 mg 166.7 mL/hr over 90 Minutes Intravenous Every 8 hours 01/22/17 1202 01/23/17 1358   01/22/17 1230  vancomycin (VANCOCIN) 2,500 mg in sodium chloride 0.9 % 500 mL IVPB     2,500 mg 250 mL/hr over 120 Minutes Intravenous  Once 01/22/17 1147 01/22/17 1438   01/15/17 1815  piperacillin-tazobactam (ZOSYN) IVPB 3.375 g  Status:  Discontinued     3.375 g 12.5 mL/hr over 240 Minutes Intravenous Every 8 hours 01/15/17 1804 01/15/17 1804   01/15/17 1630  piperacillin-tazobactam (ZOSYN) IVPB 3.375 g  Status:  Discontinued     3.375 g 12.5 mL/hr over 240 Minutes Intravenous Every 8 hours 01/15/17 1616 01/23/17 1354      Assessment/Plan: Status post Roux-en-Y/gastric bypass,12/08/16, Dr. Clent RidgesWalsh Leak at gastrojejunostomy with multiple abscesses.  S/P perc drain 01/16/17 S/p IR drain 01/21/17 - 100 cc purulent fluid drained in the IR suite S/p IR thoracentesis 01/23/17 =>> 25 cc  Stable today with no new findings or complaints.  DM HLD Morbid Obesity Body mass index:47.6 OSA with hx of CPAP FEN - on clear liquids, TPN VTE - SCDs, lovenox ID - IV  Zosyn 8/30-01/23/17 Vancomycin 9/6-01/23/17  Flagyl/cefipime 9/7 =>> day 4  Plan:  For IR drain of the left exudative pleural effusion and right perihepatic abscess today.   LOS: 12 days    Charese Abundis T 01/27/2017

## 2017-01-27 NOTE — Progress Notes (Addendum)
PROGRESS NOTE    Albert White  ZOX:[id number] DOB: February 08, 1976 DOA: 01/15/2017 PCP: Alysia Penna, MD   Brief Narrative:  41 year old male who presented with abdominal pain. Is known to have a recent Roux-en-Y/gastric bypass on 12/08/16 in [place] by Dr. Franki Cabot, rehospitalized 12/30/16-01/10/17 for anasarca, abdominal pain, fever of 103F and postoperative seroma for which a percutaneous drain was placed. Also has a diagnosis of type 2 diabetes mellitus, dyslipidemia and obstructive sleep apnea. After his discharge patient complained of persistent abdominal pain, associated with nausea, emesis and decreased appetite, outpatient CT of the abdomen showedgas containing 10.7 x 5 cm abscess abutting the posterior margin of the GJ anastomosis worrisome for dehiscence, diffuse peritonitis with large gas containing abscess throughout the peritoneal cavity including a right perihepatic abscess compressing the right liver lobe and provoking anileus, findings that prompted rehospitalization.   Patient was admitted to the hospital with the working diagnosis of abdominal sepsis due to peritonitis, and intra-abdominal abscess related to a postoperative complication of Roux-en-Y/gastric bypass (12/08/16).   Assessment & Plan:   Active Problems:   Postprocedural intraabdominal abscess   Pleural effusion on left   1. Sepsis, present on admission due to peritonitis and intra-abdominal abscess.  Antibiotic therapy with IV flagyl and cefepime (9/7 - ) (zosyn 8/30 - 9/7 -- vanc 9/6- 9/7 ).  Percutaneous drain in place (8/31) with purulent drainage in RLQ (75 cc in past 24) Epigastric drain placed (9/5) with serosanguinous drainage. (20 cc in past 24) RUQ drain placed 9/11 (430 cc out) 8/31 cx with rare gardenella vaginalis and rare strep constellatus 9/5 cx with few viridans strep and citrobacter freundii and prevotella denticola 9/11 cx pending  c/s ID for assistance with abx  regimen/duration Bcx 8/30 NGTDx5days Follow up ct with decreased in size of intra-abdominal collections, but positive findings cw dehiscence/anastomotic leak, at the gastro jejunal anastomosis. -> UGI study without evidence of leak, trialing bariatric clear liquid diet Possible eventual laparoscopy per surgery Switched to scheduled APAP and oxycodone 5-10 prn, dilaudid increased to q2 given pain from recent procedure  2. Thickening of Ascending colon/hepatic flexure concerning for colitis vs reactive inflammation:  Currently on flagyl and cefepime, may be related to peritonitis/intrabdominal abscess above.  Had diarrhea, but resolved.  CTM.    3. L greater than R pleural effusions:   S/p drainage of 25 cc 9/7.  Exudative, CXR with concern for loculation.   Negative culture, no WBC/organisms on gram stain, but 3750 WBC on cell count.    CT chest with moderate L pleural effusion with loculation.  Repeat CXR, stable. Pulm and CT surg following 9/11 chest tube placed with 180 cc out  f/u culture  f/u thoracentesis labs  2. Type 2 diabetes mellitus. Glucose cover and monitoring with sliding scale insulin, continue parenteral nutrition (receiving insulin with TPN as well).   3. Obstructive sleep apnea. Cpap,  oxymetry monitoring and as needed supplemental 02 per [place], to keep 02 saturation above 92%, continue incentive spirometer.   4. Chronic multifactorial anemia. Stable, has downtrended slowly.  CTM.  5. Morbid obesity. SP bariatric surgery, high risk for inpatient complications  6. TPN per pharmacy  DVT prophylaxis: lovenox Code Status: full  Family Communication: wife in room Disposition Plan: pending further improvement   Consultants:   Surgery  IR  pharmacy  Procedures: (Don't include imaging studies which can be auto populated. Include things that cannot be auto populated i.e. Echo, Carotid and venous dopplers, Foley, Bipap,  HD, tubes/drains, wound vac, central  lines etc)  Perc drain placement x2  Thoracentesis  Antimicrobials: (specify start and planned stop date. Auto populated tables are space occupying and do not give end dates) Anti-infectives    Start     Dose/Rate Route Frequency Ordered Stop   01/23/17 1500  metroNIDAZOLE (FLAGYL) IVPB 500 mg     500 mg 100 mL/hr over 60 Minutes Intravenous Every 8 hours 01/23/17 1353     01/23/17 1500  ceFEPIme (MAXIPIME) 2 g in dextrose 5 % 50 mL IVPB     2 g 100 mL/hr over 30 Minutes Intravenous Every 8 hours 01/23/17 1357     01/22/17 2000  vancomycin (VANCOCIN) 1,250 mg in sodium chloride 0.9 % 250 mL IVPB  Status:  Discontinued     1,250 mg 166.7 mL/hr over 90 Minutes Intravenous Every 8 hours 01/22/17 1202 01/23/17 1358   01/22/17 1230  vancomycin (VANCOCIN) 2,500 mg in sodium chloride 0.9 % 500 mL IVPB     2,500 mg 250 mL/hr over 120 Minutes Intravenous  Once 01/22/17 1147 01/22/17 1438   01/15/17 1815  piperacillin-tazobactam (ZOSYN) IVPB 3.375 g  Status:  Discontinued     3.375 g 12.5 mL/hr over 240 Minutes Intravenous Every 8 hours 01/15/17 1804 01/15/17 1804   01/15/17 1630  piperacillin-tazobactam (ZOSYN) IVPB 3.375 g  Status:  Discontinued     3.375 g 12.5 mL/hr over 240 Minutes Intravenous Every 8 hours 01/15/17 1616 01/23/17 1354        Subjective: Doing ok.  Pain better in RLQ, still present in center.   Objective: Vitals:   01/27/17 1353 01/27/17 1400 01/27/17 1415 01/27/17 1440  BP: (!) 137/93 (!) 136/97 (!) 142/96 (!) 147/82  Pulse: (!) 107 (!) 104 (!) 104 (!) 107  Resp: Temp:    (!) 97.5 F (36.4 C)  TempSrc:    Oral  SpO2: 97% 96% 98% 96%  Weight:      Height:        Intake/Output Summary (Last 24 hours) at 01/27/17 1441 Last data filed at 01/27/17 1355  Gross per 24 hour  Intake          3417.04 ml  Output             3605 ml  Net          -187.96 ml   Filed Weights   01/24/17 0418 01/25/17 0416 01/26/17 0409  Weight: (!) 151.4 kg (333 lb  12.8 oz) (!) 155.4 kg (342 lb 11.2 oz) (!) 154.6 kg (340 lb 12.8 oz)    Examination:  General: No acute distress. Cardiovascular: Heart sounds show a regular rate, and rhythm. No gallops or rubs. No murmurs. No JVD. Lungs: Clear to auscultation bilaterally with good air movement. No rales, rhonchi or wheezes. Abdomen: Soft, tender diffusely, more in epigastric/midline compared to RLQ, nondistended with normal active bowel sounds. No masses. No hepatosplenomegaly.  Drains in RLQ and epigastric region. Neurological: Alert and oriented 3. Moves all extremities 4 with equal strength. Cranial nerves II through XII grossly intact. Skin: Warm and dry. No rashes or lesions. Extremities: No clubbing or cyanosis. No edema. Pedal pulses 2+.  Data Reviewed: I have personally reviewed following labs and imaging studies  CBC:  Recent Labs Lab 01/22/17 0553 01/23/17 1248 01/25/17 0458 01/26/17 0433 01/27/17 0551  WBC 5.3 6.9 4.7 5.5 5.4  NEUTROABS  --   --   --  3.5  --  HGB 8.9* 9.4* 8.3* 8.2* 8.2*  HCT 29.3* 31.2* 27.9* 27.9* 27.3*  MCV 85.4 85.2 85.6 85.6 85.3  PLT 352 401* 418* 432* 444*   Basic Metabolic Panel:  Recent Labs Lab 01/21/17 0455 01/22/17 0553 01/23/17 1248 01/24/17 0501 01/25/17 0458 01/26/17 0433 01/27/17 0551  NA 133* 134* 135 133* 135 133* 133*  K 4.1 4.1 4.2 3.8 4.0 4.0 4.0  CL 97* 98* 97* 97* 100* 98* 98*  CO2 28 29 28 29 28 28 28   GLUCOSE 164* 161* 199* 169* 160* 137* 158*  BUN 5* 5* 8 9 8 9 8   CREATININE 0.53* 0.53* 0.60* 0.48* 0.46* 0.48* 0.48*  CALCIUM 7.9* 8.1* 8.6* 8.1* 8.2* 8.3* 8.1*  MG 1.8 1.7  --  1.7  --  1.7 1.7  PHOS 5.0* 5.3*  --  3.9  --  4.7* 4.4   GFR: Estimated Creatinine Clearance: 183.3 mL/min (A) (by C-G formula based on SCr of 0.48 mg/dL (L)). Liver Function Tests:  Recent Labs Lab 01/22/17 0553 01/23/17 1248 01/26/17 0433  AST 30 27 18   ALT 18 19 15*  ALKPHOS 139* 175* 116  BILITOT 0.6 0.8 0.4  PROT 5.7* 6.7 6.4*    ALBUMIN 1.6* 1.9* 1.9*   No results for input(s): LIPASE, AMYLASE in the last 168 hours. No results for input(s): AMMONIA in the last 168 hours. Coagulation Profile:  Recent Labs Lab 01/20/17 1744 01/23/17 0723  INR 1.19 1.12   Cardiac Enzymes: No results for input(s): CKTOTAL, CKMB, CKMBINDEX, TROPONINI in the last 168 hours. BNP (last 3 results) No results for input(s): PROBNP in the last 8760 hours. HbA1C: No results for input(s): HGBA1C in the last 72 hours. CBG:  Recent Labs Lab 01/26/17 1748 01/26/17 2013 01/27/17 0001 01/27/17 0508 01/27/17 0749  GLUCAP 92 141* 144* 145* 150*   Lipid Profile:  Recent Labs  01/26/17 0433  TRIG 116   Thyroid Function Tests: No results for input(s): TSH, T4TOTAL, FREET4, T3FREE, THYROIDAB in the last 72 hours. Anemia Panel: No results for input(s): VITAMINB12, FOLATE, FERRITIN, TIBC, IRON, RETICCTPCT in the last 72 hours. Sepsis Labs: No results for input(s): PROCALCITON, LATICACIDVEN in the last 168 hours.  Recent Results (from the past 240 hour(s))  Aerobic/Anaerobic Culture (surgical/deep wound)     Status: None   Collection Time: 01/21/17 10:04 AM  Result Value Ref Range Status   Specimen Description ABSCESS  Final   Special Requests DRAIN POST CT GUIDED PLACEMENT  Final   Gram Stain   Final    ABUNDANT WBC PRESENT,BOTH PMN AND MONONUCLEAR ABUNDANT GRAM POSITIVE COCCI IN PAIRS ABUNDANT GRAM VARIABLE ROD    Culture   Final    ABUNDANT CITROBACTER FREUNDII FEW VIRIDANS STREPTOCOCCUS CALL MICROBIOLOGY LAB IF SENSITIVITIES ARE REQUIRED. ABUNDANT PREVOTELLA DENTICOLA BETA LACTAMASE POSITIVE    Report Status 01/25/2017 FINAL  Final   Organism ID, Bacteria CITROBACTER FREUNDII  Final      Susceptibility   Citrobacter freundii - MIC*    CEFAZOLIN >=64 RESISTANT Resistant     CEFEPIME <=1 SENSITIVE Sensitive     CEFTAZIDIME >=64 RESISTANT Resistant     CEFTRIAXONE 16 INTERMEDIATE Intermediate     CIPROFLOXACIN  <=0.25 SENSITIVE Sensitive     GENTAMICIN <=1 SENSITIVE Sensitive     IMIPENEM <=0.25 SENSITIVE Sensitive     TRIMETH/SULFA <=20 SENSITIVE Sensitive     PIP/TAZO 64 INTERMEDIATE Intermediate     * ABUNDANT CITROBACTER FREUNDII  Gram stain     Status: None  Collection Time: 01/23/17 12:53 PM  Result Value Ref Range Status   Specimen Description PLEURAL LEFT  Final   Special Requests NONE  Final   Gram Stain NO WBC SEEN NO ORGANISMS SEEN   Final   Report Status 01/23/2017 FINAL  Final  Culture, body fluid-bottle     Status: None (Preliminary result)   Collection Time: 01/23/17 12:53 PM  Result Value Ref Range Status   Specimen Description PLEURAL LEFT  Final   Special Requests NONE  Final   Culture NO GROWTH 4 DAYS  Final   Report Status PENDING  Incomplete         Radiology Studies: Dg Chest Port 1 View  Result Date: 01/26/2017 CLINICAL DATA:  Pleural effusion EXAM: PORTABLE CHEST 1 VIEW COMPARISON:  01/23/2017 FINDINGS: Stable right upper extremity PICC. Stable bilateral pleural effusions and bilateral predominately basilar airspace disease left greater than right. No pneumothorax. IMPRESSION: Stable bilateral pleural effusions and bilateral airspace disease. Electronically Signed   By: Jolaine Click M.D.   On: 01/26/2017 07:20        Scheduled Meds: . acetaminophen  1,000 mg Oral Q8H  . chlorhexidine  15 mL Mouth Rinse BID  . Chlorhexidine Gluconate Cloth  6 each Topical Daily  . enoxaparin (LOVENOX) injection  40 mg Subcutaneous Q12H  . fentaNYL      . hydrocortisone   Rectal BID  . insulin aspart  0-20 Units Subcutaneous Q4H  . lidocaine (PF)      . mouth rinse  15 mL Mouth Rinse q12n4p  . midazolam      . pneumococcal 23 valent vaccine  0.5 mL Intramuscular Tomorrow-1000  . protein supplement shake  2 oz Oral QID  . sodium chloride flush  5 mL Intravenous Q8H  . sodium chloride flush  5 mL Intravenous Q8H   Continuous Infusions: . Marland KitchenTPN (CLINIMIX-E) Adult       And  . fat emulsion    . sodium chloride 35 mL/hr at 01/25/17 2046  . ceFEPime (MAXIPIME) IV Stopped (01/27/17 7829)  . magnesium sulfate 1 - 4 g bolus IVPB 2 g (01/27/17 1441)  . methocarbamol (ROBAXIN)  IV 1,000 mg (01/27/17 1441)  . metronidazole Stopped (01/27/17 0800)  . Marland KitchenTPN (CLINIMIX-E) Adult 83 mL/hr at 01/26/17 1735     LOS: 12 days    Time spent: 25 min    Lacretia Nicks, MD [facility] [phone]   If 7PM-7AM, please contact night-coverage [email or web address] Password [email or web address] 01/27/2017, 2:41 PM

## 2017-01-27 NOTE — Progress Notes (Signed)
Patient refuses CPAP. RT will monitor as needed. 

## 2017-01-27 NOTE — Progress Notes (Addendum)
PULMONARY / CRITICAL CARE MEDICINE   Name: Albert White MRN: [id number] DOB: [birth year]    ADMISSION DATE:  01/15/2017 CONSULTATION DATE:  01/25/2017  REFERRING MD:  Dr. Dorris Fetch  CHIEF COMPLAINT: Left  Pleural effusion  brief 41 yo male had roux en y surgery at [facility] July 23.  He developed scrotal swelling and pain.  Found to have hydrocele and varicocele.  He was then found to have abdominal wall hematoma, fever 103F, and seroma.  He had CT abd which showed abscess.  He was admitted to [facility] and seen by surgery.  He had drain placed by IR and started on Abx.  He was noted to have b/l pleural effusions.  He had attempted thoracentesis with only 25 ml of fluid able to be removed.  He was seen by thoracic surgery, who requested a PCCM consult.  PAST MEDICAL HISTORY :  He  has a past medical history of Abscess; Diabetes mellitus without complication (HCC); Hidradenitis suppurativa; Hyperlipemia; Morbid obesity (HCC); and Vertigo.  PAST SURGICAL HISTORY: He  has a past surgical history that includes Incision and drainage deep neck abscess; Incision and drainage abscess (Right, 12/15/2013); and IR THORACENTESIS ASP PLEURAL SPACE W/IMG GUIDE (01/23/2017).   EVENTS 01/15/2017 - admit 9/9- - pulm consult   STUDIES:  Lt pleural fluid 9/07 >> glucose 130, protein 3.8, LDH 453, WBC 3750 (54 N, 43 L) CT chest 9/08 >> lower lobe consolidation b/l, mod Lt effusion, small Rt effusion  CULTURES: Blood 8/30 >> Abdominal abscess 8/31 >> Streptococcus constellatus, gardnerella vaginalis Abdominal abscess 9/05 >> citrobacter freundii, viridans streptococcus Lt pleural fluid 9/07 >>  ANTIBIOTICS: Zosyn 8/30 >> 9/05 Vancomycin 9/05 >> 9/06 Cefepime 9/07 >> Flagyl 9/07 >>   SIGNIFICANT EVENTS: 8/31 Admit, surgery consulted, abscess drain by IR 9/04 2nd drain by IR 9/07 Lt thoracentesis 9/11 IR pig tail placement / Additional abdominal drain  LINES/TUBES: Rt PICC 8/31  >>   SUBJECTIVE/OVERNIGHT/INTERVAL HX 01/27/17 - Pt.  and wife in room  Awaiting   IR guided pig tail placement ( Thrombolytic therapy) and abdominal drain placement. He is OOB in chair, Denies acute complaints. States he does have dyspnea on exertion. Walked today with wife, required oxygen at 2-3 L. In NAD  VITAL SIGNS: BP 133/80 (BP Location: Left Arm)   Pulse 97   Temp 98.2 F (36.8 C) (Oral)   Resp 18   Ht  (1.778 m)   Wt (!) 340 lb 12.8 oz (154.6 kg)   SpO2 95%   BMI 48.90 kg/m   INTAKE / OUTPUT: I/O last 3 completed shifts: In: 5375.3 [P.O.:120; I.V.:4585.3; Other:30; IV Piggyback:640] Out: 3335 [Urine:3200; Drains:135]  PHYSICAL EXAMINATION:   General Appearance:   Obese, in NAD, sitting in chair  Head:    Normocephalic, without obvious abnormality, atraumatic  Eyes:    PERRL  conjunctiva/corneas - clear      Ears:    Normal external ear canals, both ears     Throat:  Normal, midline trachea  Neck:   Supple,  No enlargement/tenderness/nodules, thick     Lungs:     Clear to auscultation bilaterally, diminished per bases, L>R  Chest wall:    WNL, large body habitus  Heart:    S1 and S2 normal, RRR, No RMG  Abdomen:     Soft, no masses, no organomegaly, obese with drains   Genitalia:    Not done  Rectal:   not done  Extremities:   Extremities- no cyannosis,  no clubing, no edema     Skin:  warm dry and  Intact in exposed areas, no rash or lesions noted .      Neurologic:   . Moves all 4s - yes. CAM-ICU - neg for delirium . Orientation A&O x 3, cranial nerves intact      LABS:  BMET  Recent Labs Lab 01/25/17 0458 01/26/17 0433 01/27/17 0551  NA 135 133* 133*  K 4.0 4.0 4.0  CL 100* 98* 98*  CO2 BUN CREATININE 0.46* 0.48* 0.48*  GLUCOSE 160* 137* 158*    Electrolytes  Recent Labs Lab 01/24/17 0501 01/25/17 0458 01/26/17 0433 01/27/17 0551  CALCIUM 8.1* 8.2* 8.3* 8.1*  MG 1.7  --  1.7 1.7  PHOS 3.9  --  4.7* 4.4     CBC  Recent Labs Lab 01/25/17 0458 01/26/17 0433 01/27/17 0551  WBC 4.7 5.5 5.4  HGB 8.3* 8.2* 8.2*  HCT 27.9* 27.9* 27.3*  PLT 418* 432* 444*    Coag's  Recent Labs Lab 01/20/17 1744 01/23/17 0723  INR 1.19 1.12    Sepsis Markers No results for input(s): LATICACIDVEN, PROCALCITON, O2SATVEN in the last 168 hours.  ABG No results for input(s): PHART, PCO2ART, PO2ART in the last 168 hours.  Liver Enzymes  Recent Labs Lab 01/22/17 0553 01/23/17 1248 01/26/17 0433  AST ALT 18 19 15*  ALKPHOS 139* 175* 116  BILITOT 0.6 0.8 0.4  ALBUMIN 1.6* 1.9* 1.9*    Cardiac Enzymes No results for input(s): TROPONINI, PROBNP in the last 168 hours.  Glucose  Recent Labs Lab 01/26/17 1232 01/26/17 1748 01/26/17 2013 01/27/17 0001 01/27/17 0508 01/27/17 0749  GLUCAP 137* 92 141* 144* 145* 150*    Imaging No results found.  ASSESSMENT / PLAN:  Exudative Lt pleural effusion. -Scheduled for  IR guided pig tail +/-  Thrombolytic, and additional abdominal drain placement today ( 9/11) NPO awaiting procedure Follow up micro and pleural fluid labs/pathology Follow up CXR  In am and prn  Titrate oxygen to Maintain oxygen saturations> 94%    Bevelyn Ngo, AGACNP-BC [facility] Pulmonary/Critical Care Medicine Pager # [phone]  If no answer or between  15:00h - 7:00h: call [phone]  01/27/2017 10:49 AM   STAFF NOTE: Cindi Carbon, MD FACP have personally reviewed patient's available data, including medical history, events of note, physical examination and test results as part of my evaluation. I have discussed with resident/NP and other care providers such as pharmacist, RN and RRT. In addition, I personally evaluated patient and elicited key findings of: awake, alert , just back from IR drain placement x 1, broncbial BS left remains and distant, edema legs, abdo drains noted, pure pus on new drain abdo, serous ang thin from chest tube,  output looks good in volume thus far after placement, his pain is out of control, this effusion is either sympathetic or empyema ( favor), will ensure labs sent and culture from new tube chest, obtain pcxr now and in am and follow output overnight prior to addition of TPA, if output lags and loculation is present - will add tpa into chest tube, control pain, some alteration in dilaudid to make, tpn to continue, follow lytes, would favor neg balance with lasix as able, ensure mag supp given, I updated family and pt and spoke to PCP Dr Lowell Guitar, chest tube to suction for now 20 cm  Mcarthur Rossetti.  Tyson AliasFeinstein, MD, FACP Pgr: [phone] [facility] 01/27/2017 2:21 PM

## 2017-01-27 NOTE — Sedation Documentation (Signed)
Patient is resting comfortably. Pain same prior to procedure general abdominal pain 9/10 achy

## 2017-01-27 NOTE — Procedures (Signed)
Interventional Radiology Procedure Note  Procedure: CT guided drainage of perihepatic/subcapsular fluid with 71F drain.  To gravity.   CT guided drainage of left pleural fluid with 20F drain.    Sent for culture.    Complications: None  Recommendations:  - Chest tube to [facility]/suction.  CXR in the am - Follow up culture for both samples - Routine abdominal drain care - Do not flush the left chest tube.    Signed,  Yvone NeuJaime S. Loreta AveWagner, DO

## 2017-01-27 NOTE — Consult Note (Addendum)
[place] for Infectious Disease  Date of Admission:  01/15/2017  Date of Consult:  01/27/2017  Reason for Consult: Abdominal abscess Referring Physician: Florene Glen  Impression/Recommendation Abdominal abscesses  S/P Gastric surgery 12-08-16  DM2 x1 year Morbid obesity  Would Change his anbx to invanz/flagyl for now.  Get more info on gardnerella infections in abd (usually in vagina).  Continue to watch his drains, f/u repeat CT in future (~ 1 week?) Await Cx from his most recent pleural drain and hepatic abscess.  Diabetic control  Explained to pt and spouse.   Thank you so much for this interesting consult,   Bobby Rumpf (pager) [phone] [email or web address]  Albert White is an 41 y.o. male.  HPI: 41 yo M with Roux-en-Y 12-08-16. His course was complicated by hospitalization 8-14 to 01-10-17 with abd pain, fever and a post-op seroma for which a drain was placed.  He f/u with PCP on 8-29 and was found on CT to have a 107 x 5 cm gas containing abscess, peritonitis, and a R perihepatic abscess. He was adm to [facility] and had percutaneous peritoneal drain placed by IR on 8-31. Cx Strep conteslatus, gardnerella vaginalis.  He was started on zosyn and TPN. He had a repeat drain on 9-5 (Cx viridans strep, C freundii). His anbx were changed to flagyl/cefepime on 9-7.  He had L thoracentesis on 9-7 showing Glc 130, Prot 3.8, LDH 453 and WBC 3750 (54% N, 43% L). Cx ngtd.  He had gastric study on showing no leak.  He had repeat CT on 9-8 showing increase in R perihepatic fluid collection as well as loculated L pleural effusion.  On 9-11 he had drain placed in his perihepatic abscess. He underwent IR guided pigtail drain pleural drain today as well.   His WBC is normal, he is afebrile.   Past Medical History:  Diagnosis Date  . Abscess   . Diabetes mellitus without complication ([place])   . Hidradenitis suppurativa   . Hyperlipemia   . Morbid obesity ([place])   .  Vertigo     Past Surgical History:  Procedure Laterality Date  . INCISION AND DRAINAGE ABSCESS Right 12/15/2013   Procedure: INCISION AND DRAINAGE Right Posterior neck ABSCESS;  Surgeon: Gwenyth Ober, MD;  Location: [facility];  Service: General;  Laterality: Right;  . INCISION AND DRAINAGE DEEP NECK ABSCESS     Hidradenitis - 3-4 times at [facility] and [place]  . IR THORACENTESIS ASP PLEURAL SPACE W/IMG GUIDE  01/23/2017     No Known Allergies  Medications:  Scheduled: . acetaminophen  1,000 mg Oral Q8H  . chlorhexidine  15 mL Mouth Rinse BID  . Chlorhexidine Gluconate Cloth  6 each Topical Daily  . enoxaparin (LOVENOX) injection  40 mg Subcutaneous Q12H  . fentaNYL      . hydrocortisone   Rectal BID  . insulin aspart  0-20 Units Subcutaneous Q4H  . lidocaine (PF)      . mouth rinse  15 mL Mouth Rinse q12n4p  . midazolam      . pneumococcal 23 valent vaccine  0.5 mL Intramuscular Tomorrow-1000  . protein supplement shake  2 oz Oral QID  . sodium chloride flush  5 mL Intravenous Q8H  . sodium chloride flush  5 mL Intravenous Q8H    Abtx:  Anti-infectives    Start     Dose/Rate Route Frequency Ordered Stop   01/23/17 1500  metroNIDAZOLE (FLAGYL) IVPB 500 mg  500 mg 100 mL/hr over 60 Minutes Intravenous Every 8 hours 01/23/17 1353     01/23/17 1500  ceFEPIme (MAXIPIME) 2 g in dextrose 5 % 50 mL IVPB     2 g 100 mL/hr over 30 Minutes Intravenous Every 8 hours 01/23/17 1357     01/22/17 2000  vancomycin (VANCOCIN) 1,250 mg in sodium chloride 0.9 % 250 mL IVPB  Status:  Discontinued     1,250 mg 166.7 mL/hr over 90 Minutes Intravenous Every 8 hours 01/22/17 1202 01/23/17 1358   01/22/17 1230  vancomycin (VANCOCIN) 2,500 mg in sodium chloride 0.9 % 500 mL IVPB     2,500 mg 250 mL/hr over 120 Minutes Intravenous  Once 01/22/17 1147 01/22/17 1438   01/15/17 1815  piperacillin-tazobactam (ZOSYN) IVPB 3.375 g  Status:  Discontinued     3.375 g 12.5 mL/hr over 240 Minutes Intravenous  Every 8 hours 01/15/17 1804 01/15/17 1804   01/15/17 1630  piperacillin-tazobactam (ZOSYN) IVPB 3.375 g  Status:  Discontinued     3.375 g 12.5 mL/hr over 240 Minutes Intravenous Every 8 hours 01/15/17 1616 01/23/17 1354      Total days of antibiotics: 12 cefepime/flagyl          Social History:  reports that he quit smoking about 3 years ago. He smoked 1.00 pack per day. He has never used smokeless tobacco. He reports that he does not drink alcohol or use drugs.  Family History  Problem Relation Age of Onset  . Arthritis Mother   . Hypertension Mother     History obtained from spouse, chart review and the patient General ROS: no change in vision, mild dyspnea, hungry+, normal BM, normal urine, normal sensation Please see HPI. 12 point ROS o/w (-)  Blood pressure (!) 147/82, pulse (!) 107, temperature (!) 97.5 F (36.4 C), temperature source Oral, resp. rate 18, height 5' 10" (1.778 m), weight (!) 154.6 kg (340 lb 12.8 oz), SpO2 96 %. General appearance: alert, cooperative, no distress and morbidly obese Eyes: negative findings: pupils equal, round, reactive to light and accomodation Throat: normal findings: oropharynx pink & moist without lesions or evidence of thrush Neck: no adenopathy and supple, symmetrical, trachea midline Lungs: diminished breath sounds anterior - bilateral Abdomen: normal findings: bowel sounds normal and soft, non-tender and multiple drains in place, purulent fluid noted.  Rectal: deferred Extremities: edema none, no diabetic foot lesions, peeling of callus off soles.  and RUE PIC is clean, no erythema.    Results for orders placed or performed during the hospital encounter of 01/15/17 (from the past 48 hour(s))  Glucose, capillary     Status: Abnormal   Collection Time: 01/25/17  8:25 PM  Result Value Ref Range   Glucose-Capillary 118 (H) 65 - 99 mg/dL  Glucose, capillary     Status: Abnormal   Collection Time: 01/25/17 11:51 PM  Result Value Ref  Range   Glucose-Capillary 135 (H) 65 - 99 mg/dL  Glucose, capillary     Status: Abnormal   Collection Time: 01/26/17  3:59 AM  Result Value Ref Range   Glucose-Capillary 134 (H) 65 - 99 mg/dL  Comprehensive metabolic panel     Status: Abnormal   Collection Time: 01/26/17  4:33 AM  Result Value Ref Range   Sodium 133 (L) 135 - 145 mmol/L   Potassium 4.0 3.5 - 5.1 mmol/L   Chloride 98 (L) 101 - 111 mmol/L   CO2 28 22 - 32 mmol/L   Glucose, Bld 137 (  H) 65 - 99 mg/dL   BUN 9 6 - 20 mg/dL   Creatinine, Ser 0.48 (L) 0.61 - 1.24 mg/dL   Calcium 8.3 (L) 8.9 - 10.3 mg/dL   Total Protein 6.4 (L) 6.5 - 8.1 g/dL   Albumin 1.9 (L) 3.5 - 5.0 g/dL   AST 18 15 - 41 U/L   ALT 15 (L) 17 - 63 U/L   Alkaline Phosphatase 116 38 - 126 U/L   Total Bilirubin 0.4 0.3 - 1.2 mg/dL   GFR calc non Af Amer >60 >60 mL/min   GFR calc Af Amer >60 >60 mL/min    Comment: (NOTE) The eGFR has been calculated using the CKD EPI equation. This calculation has not been validated in all clinical situations. eGFR's persistently <60 mL/min signify possible Chronic Kidney Disease.    Anion gap 7 5 - 15  Magnesium     Status: None   Collection Time: 01/26/17  4:33 AM  Result Value Ref Range   Magnesium 1.7 1.7 - 2.4 mg/dL  Phosphorus     Status: Abnormal   Collection Time: 01/26/17  4:33 AM  Result Value Ref Range   Phosphorus 4.7 (H) 2.5 - 4.6 mg/dL  CBC     Status: Abnormal   Collection Time: 01/26/17  4:33 AM  Result Value Ref Range   WBC 5.5 4.0 - 10.5 K/uL   RBC 3.26 (L) 4.22 - 5.81 MIL/uL   Hemoglobin 8.2 (L) 13.0 - 17.0 g/dL   HCT 27.9 (L) 39.0 - 52.0 %   MCV 85.6 78.0 - 100.0 fL   MCH 25.2 (L) 26.0 - 34.0 pg   MCHC 29.4 (L) 30.0 - 36.0 g/dL   RDW 15.8 (H) 11.5 - 15.5 %   Platelets 432 (H) 150 - 400 K/uL  Differential     Status: None   Collection Time: 01/26/17  4:33 AM  Result Value Ref Range   Neutrophils Relative % 64 %   Neutro Abs 3.5 1.7 - 7.7 K/uL   Lymphocytes Relative 24 %   Lymphs Abs  1.3 0.7 - 4.0 K/uL   Monocytes Relative 8 %   Monocytes Absolute 0.5 0.1 - 1.0 K/uL   Eosinophils Relative 4 %   Eosinophils Absolute 0.2 0.0 - 0.7 K/uL   Basophils Relative 0 %   Basophils Absolute 0.0 0.0 - 0.1 K/uL  Prealbumin     Status: Abnormal   Collection Time: 01/26/17  4:33 AM  Result Value Ref Range   Prealbumin 11.1 (L) 18 - 38 mg/dL  Triglycerides     Status: None   Collection Time: 01/26/17  4:33 AM  Result Value Ref Range   Triglycerides 116 <150 mg/dL  Glucose, capillary     Status: Abnormal   Collection Time: 01/26/17  7:38 AM  Result Value Ref Range   Glucose-Capillary 147 (H) 65 - 99 mg/dL  Glucose, capillary     Status: Abnormal   Collection Time: 01/26/17 12:32 PM  Result Value Ref Range   Glucose-Capillary 137 (H) 65 - 99 mg/dL  Glucose, capillary     Status: None   Collection Time: 01/26/17  5:48 PM  Result Value Ref Range   Glucose-Capillary 92 65 - 99 mg/dL  Glucose, capillary     Status: Abnormal   Collection Time: 01/26/17  8:13 PM  Result Value Ref Range   Glucose-Capillary 141 (H) 65 - 99 mg/dL  Glucose, capillary     Status: Abnormal   Collection Time: 01/27/17 12:01 AM  Result Value Ref Range   Glucose-Capillary 144 (H) 65 - 99 mg/dL  Glucose, capillary     Status: Abnormal   Collection Time: 01/27/17  5:08 AM  Result Value Ref Range   Glucose-Capillary 145 (H) 65 - 99 mg/dL  CBC     Status: Abnormal   Collection Time: 01/27/17  5:51 AM  Result Value Ref Range   WBC 5.4 4.0 - 10.5 K/uL   RBC 3.20 (L) 4.22 - 5.81 MIL/uL   Hemoglobin 8.2 (L) 13.0 - 17.0 g/dL   HCT 27.3 (L) 39.0 - 52.0 %   MCV 85.3 78.0 - 100.0 fL   MCH 25.6 (L) 26.0 - 34.0 pg   MCHC 30.0 30.0 - 36.0 g/dL   RDW 16.3 (H) 11.5 - 15.5 %   Platelets 444 (H) 150 - 400 K/uL  Basic metabolic panel     Status: Abnormal   Collection Time: 01/27/17  5:51 AM  Result Value Ref Range   Sodium 133 (L) 135 - 145 mmol/L   Potassium 4.0 3.5 - 5.1 mmol/L   Chloride 98 (L) 101 - 111  mmol/L   CO2 28 22 - 32 mmol/L   Glucose, Bld 158 (H) 65 - 99 mg/dL   BUN 8 6 - 20 mg/dL   Creatinine, Ser 0.48 (L) 0.61 - 1.24 mg/dL   Calcium 8.1 (L) 8.9 - 10.3 mg/dL   GFR calc non Af Amer >60 >60 mL/min   GFR calc Af Amer >60 >60 mL/min    Comment: (NOTE) The eGFR has been calculated using the CKD EPI equation. This calculation has not been validated in all clinical situations. eGFR's persistently <60 mL/min signify possible Chronic Kidney Disease.    Anion gap 7 5 - 15  Phosphorus     Status: None   Collection Time: 01/27/17  5:51 AM  Result Value Ref Range   Phosphorus 4.4 2.5 - 4.6 mg/dL  Magnesium     Status: None   Collection Time: 01/27/17  5:51 AM  Result Value Ref Range   Magnesium 1.7 1.7 - 2.4 mg/dL  Glucose, capillary     Status: Abnormal   Collection Time: 01/27/17  7:49 AM  Result Value Ref Range   Glucose-Capillary 150 (H) 65 - 99 mg/dL  Glucose, capillary     Status: Abnormal   Collection Time: 01/27/17  4:15 PM  Result Value Ref Range   Glucose-Capillary 121 (H) 65 - 99 mg/dL      Component Value Date/Time   SDES PLEURAL LEFT 01/23/2017 1253   SDES PLEURAL LEFT 01/23/2017 1253   SPECREQUEST NONE 01/23/2017 1253   SPECREQUEST NONE 01/23/2017 1253   CULT NO GROWTH 4 DAYS 01/23/2017 1253   REPTSTATUS 01/23/2017 FINAL 01/23/2017 1253   REPTSTATUS PENDING 01/23/2017 1253   Dg Chest Port 1 View  Result Date: 01/26/2017 CLINICAL DATA:  Pleural effusion EXAM: PORTABLE CHEST 1 VIEW COMPARISON:  01/23/2017 FINDINGS: Stable right upper extremity PICC. Stable bilateral pleural effusions and bilateral predominately basilar airspace disease left greater than right. No pneumothorax. IMPRESSION: Stable bilateral pleural effusions and bilateral airspace disease. Electronically Signed   By: Marybelle Killings M.D.   On: 01/26/2017 07:20   Ct Image Guided Drainage By Percutaneous Catheter  Result Date: 01/27/2017 INDICATION: 41 year old male with a history of gastric bypass  surgery and multiple intra-abdominal fluid collections and exited infusion of the left chest. EXAM: CT-GUIDED DRAIN OF RIGHT PERI HEPATIC/SUBCAPSULAR FLUID. CT-GUIDED LEFT CHEST TUBE PLACEMENT MEDICATIONS: The patient is currently admitted  to the hospital and receiving intravenous antibiotics. The antibiotics were administered within an appropriate time frame prior to the initiation of the procedure. ANESTHESIA/SEDATION: 3.0 mg IV Versed 200 mcg IV Fentanyl Moderate Sedation Time:  31 minutes The patient was continuously monitored during the procedure by the interventional radiology nurse under my direct supervision. COMPLICATIONS: None TECHNIQUE: Informed written consent was obtained from the patient after a thorough discussion of the procedural risks, benefits and alternatives. All questions were addressed. Maximal Sterile Barrier Technique was utilized including caps, mask, sterile gowns, sterile gloves, sterile drape, hand hygiene and skin antiseptic. A timeout was performed prior to the initiation of the procedure. PROCEDURE: Patient is positioned supine position on the CT gantry table. Scout CT image of the upper abdomen and lower chest performed for planning purposes. The right upper abdomen was addressed. The right upper abdomen was prepped with chlorhexidine in a sterile fashion, and a sterile drape was applied covering the operative field. A sterile gown and sterile gloves were used for the procedure. Local anesthesia was provided with 1% Lidocaine. Once the patient is prepped and draped in the usual sterile fashion, the skin and subcutaneous tissues were generously infiltrated 1% lidocaine for local anesthesia. Using a trocar needle, the fluid at the right peripheral liver was approach. Once we confirmed the needle tip within the fluid, modified Seldinger technique was used to place a 12 [place] drain. Drain was sutured in position and attached to gravity drainage. The left chest was then addressed. The  lower left chest was prepped with chlorhexidine in a sterile fashion common sterile drape was applied covering the operative field. Sterile gown and gloves were used. Local anesthesia provided with 1% lidocaine. Once the patient was prepped and draped in the usual sterile fashion, the skin and subcutaneous tissues were generously infiltrated 1% lidocaine for local anesthesia. Using CT guidance, trocar needle was advanced into the fluid of the left pleural space. Using modified Seldinger technique, a 10 French drain was placed. This was attached to water seal chamber. Culture from the abdomen and the chest were sent to the lab. Final CT image was acquired. Patient tolerated the procedure well and remained hemodynamically stable throughout. No complications were encountered and no significant blood loss. FINDINGS: Initial CT image demonstrates perihepatic fluid collection of the right aspect of the liver, likely subcapsular given the configuration. Unchanged from prior. Drainage catheter terminates in the midline adjacent to the surgical changes of gastric bypass, similar prior. Small fluid adjacent to the spleen. Unchanged appearance of left pleural fluid. Status post drainage of right upper abdomen and left chest fluid, decreased size of the fluid collection of the right abdomen, with unchanged size of fluid collection in the chest. Paragraphs sample was sent for me age fluid location. IMPRESSION: Status post CT-guided 12 French drain placement of the right upper abdomen, and CT-guided left thoracostomy tube with 10 French drain placed. Sample was sent to the lab for culture from each location. Signed, Dulcy Fanny. Earleen Newport, DO [facility] Radiology Electronically Signed   By: Corrie Mckusick D.O.   On: 01/27/2017 15:32   Ct Image Guided Drainage By Percutaneous Catheter  Result Date: 01/27/2017 INDICATION: 41 year old male with a history of gastric bypass surgery and  multiple intra-abdominal fluid collections and exited infusion of the left chest. EXAM: CT-GUIDED DRAIN OF RIGHT PERI HEPATIC/SUBCAPSULAR FLUID. CT-GUIDED LEFT CHEST TUBE PLACEMENT MEDICATIONS: The patient is currently admitted to the hospital and receiving intravenous antibiotics. The antibiotics were administered  within an appropriate time frame prior to the initiation of the procedure. ANESTHESIA/SEDATION: 3.0 mg IV Versed 200 mcg IV Fentanyl Moderate Sedation Time:  31 minutes The patient was continuously monitored during the procedure by the interventional radiology nurse under my direct supervision. COMPLICATIONS: None TECHNIQUE: Informed written consent was obtained from the patient after a thorough discussion of the procedural risks, benefits and alternatives. All questions were addressed. Maximal Sterile Barrier Technique was utilized including caps, mask, sterile gowns, sterile gloves, sterile drape, hand hygiene and skin antiseptic. A timeout was performed prior to the initiation of the procedure. PROCEDURE: Patient is positioned supine position on the CT gantry table. Scout CT image of the upper abdomen and lower chest performed for planning purposes. The right upper abdomen was addressed. The right upper abdomen was prepped with chlorhexidine in a sterile fashion, and a sterile drape was applied covering the operative field. A sterile gown and sterile gloves were used for the procedure. Local anesthesia was provided with 1% Lidocaine. Once the patient is prepped and draped in the usual sterile fashion, the skin and subcutaneous tissues were generously infiltrated 1% lidocaine for local anesthesia. Using a trocar needle, the fluid at the right peripheral liver was approach. Once we confirmed the needle tip within the fluid, modified Seldinger technique was used to place a 12 [place] drain. Drain was sutured in position and attached to gravity drainage. The left chest was then addressed. The lower left  chest was prepped with chlorhexidine in a sterile fashion common sterile drape was applied covering the operative field. Sterile gown and gloves were used. Local anesthesia provided with 1% lidocaine. Once the patient was prepped and draped in the usual sterile fashion, the skin and subcutaneous tissues were generously infiltrated 1% lidocaine for local anesthesia. Using CT guidance, trocar needle was advanced into the fluid of the left pleural space. Using modified Seldinger technique, a 10 French drain was placed. This was attached to water seal chamber. Culture from the abdomen and the chest were sent to the lab. Final CT image was acquired. Patient tolerated the procedure well and remained hemodynamically stable throughout. No complications were encountered and no significant blood loss. FINDINGS: Initial CT image demonstrates perihepatic fluid collection of the right aspect of the liver, likely subcapsular given the configuration. Unchanged from prior. Drainage catheter terminates in the midline adjacent to the surgical changes of gastric bypass, similar prior. Small fluid adjacent to the spleen. Unchanged appearance of left pleural fluid. Status post drainage of right upper abdomen and left chest fluid, decreased size of the fluid collection of the right abdomen, with unchanged size of fluid collection in the chest. Paragraphs sample was sent for me age fluid location. IMPRESSION: Status post CT-guided 12 French drain placement of the right upper abdomen, and CT-guided left thoracostomy tube with 10 French drain placed. Sample was sent to the lab for culture from each location. Signed, Dulcy Fanny. Earleen Newport, DO [facility] Radiology Electronically Signed   By: Corrie Mckusick D.O.   On: 01/27/2017 15:32   Recent Results (from the past 240 hour(s))  Aerobic/Anaerobic Culture (surgical/deep wound)     Status: None   Collection Time: 01/21/17 10:04 AM  Result Value  Ref Range Status   Specimen Description ABSCESS  Final   Special Requests DRAIN POST CT GUIDED PLACEMENT  Final   Gram Stain   Final    ABUNDANT WBC PRESENT,BOTH PMN AND MONONUCLEAR ABUNDANT GRAM POSITIVE COCCI IN PAIRS ABUNDANT GRAM VARIABLE  ROD    Culture   Final    ABUNDANT CITROBACTER FREUNDII FEW VIRIDANS STREPTOCOCCUS CALL MICROBIOLOGY LAB IF SENSITIVITIES ARE REQUIRED. ABUNDANT PREVOTELLA DENTICOLA BETA LACTAMASE POSITIVE    Report Status 01/25/2017 FINAL  Final   Organism ID, Bacteria CITROBACTER FREUNDII  Final      Susceptibility   Citrobacter freundii - MIC*    CEFAZOLIN >=64 RESISTANT Resistant     CEFEPIME <=1 SENSITIVE Sensitive     CEFTAZIDIME >=64 RESISTANT Resistant     CEFTRIAXONE 16 INTERMEDIATE Intermediate     CIPROFLOXACIN <=0.25 SENSITIVE Sensitive     GENTAMICIN <=1 SENSITIVE Sensitive     IMIPENEM <=0.25 SENSITIVE Sensitive     TRIMETH/SULFA <=20 SENSITIVE Sensitive     PIP/TAZO 64 INTERMEDIATE Intermediate     * ABUNDANT CITROBACTER FREUNDII  Gram stain     Status: None   Collection Time: 01/23/17 12:53 PM  Result Value Ref Range Status   Specimen Description PLEURAL LEFT  Final   Special Requests NONE  Final   Gram Stain NO WBC SEEN NO ORGANISMS SEEN   Final   Report Status 01/23/2017 FINAL  Final  Culture, body fluid-bottle     Status: None (Preliminary result)   Collection Time: 01/23/17 12:53 PM  Result Value Ref Range Status   Specimen Description PLEURAL LEFT  Final   Special Requests NONE  Final   Culture NO GROWTH 4 DAYS  Final   Report Status PENDING  Incomplete      01/27/2017, 6:00 PM     LOS: 12 days    Records and images were personally reviewed where available.  Bobby Rumpf, MD [facility] for [facility] [place] [facility] [phone] 01/27/2017, 6:00 PM

## 2017-01-27 NOTE — Progress Notes (Signed)
1440- Received post Left chest tube and Right drain placement, patient alert and oriented. Left chest tube placed on continuous suction @ 20mm draining serousanguenous fluid.Right drain with milky yellowish output.

## 2017-01-27 NOTE — Sedation Documentation (Signed)
Family updated as to patient's status.

## 2017-01-27 NOTE — Plan of Care (Signed)
Problem: Pain Managment: Goal: General experience of comfort will improve Outcome: Not Progressing Pt's pain is 10/10 all the time. PRN pain meds are being given in timely manner.

## 2017-01-28 ENCOUNTER — Inpatient Hospital Stay (HOSPITAL_COMMUNITY): Payer: 59

## 2017-01-28 DIAGNOSIS — J90 Pleural effusion, not elsewhere classified: Secondary | ICD-10-CM

## 2017-01-28 DIAGNOSIS — J810 Acute pulmonary edema: Secondary | ICD-10-CM

## 2017-01-28 LAB — LACTATE DEHYDROGENASE, PLEURAL OR PERITONEAL FLUID: LD FL: 371 U/L — AB (ref 3–23)

## 2017-01-28 LAB — GLUCOSE, CAPILLARY
GLUCOSE-CAPILLARY: 142 mg/dL — AB (ref 65–99)
GLUCOSE-CAPILLARY: 108 mg/dL — AB (ref 65–99)
Glucose-Capillary: 111 mg/dL — ABNORMAL HIGH (ref 65–99)
Glucose-Capillary: 122 mg/dL — ABNORMAL HIGH (ref 65–99)
Glucose-Capillary: 129 mg/dL — ABNORMAL HIGH (ref 65–99)
Glucose-Capillary: 137 mg/dL — ABNORMAL HIGH (ref 65–99)
Glucose-Capillary: 142 mg/dL — ABNORMAL HIGH (ref 65–99)

## 2017-01-28 LAB — GLUCOSE, PLEURAL OR PERITONEAL FLUID: GLUCOSE FL: 117 mg/dL

## 2017-01-28 LAB — ALBUMIN, PLEURAL OR PERITONEAL FLUID: Albumin, Fluid: 1.5 g/dL

## 2017-01-28 LAB — BODY FLUID CELL COUNT WITH DIFFERENTIAL
EOS FL: 1 %
Lymphs, Fluid: 72 %
Monocyte-Macrophage-Serous Fluid: 6 % — ABNORMAL LOW (ref 50–90)
NEUTROPHIL FLUID: 21 % (ref 0–25)
Total Nucleated Cell Count, Fluid: 260 cu mm (ref 0–1000)

## 2017-01-28 LAB — CULTURE, BODY FLUID W GRAM STAIN -BOTTLE: Culture: NO GROWTH

## 2017-01-28 LAB — PROTEIN, PLEURAL OR PERITONEAL FLUID: Total protein, fluid: 4 g/dL

## 2017-01-28 MED ORDER — SODIUM CHLORIDE 0.9% FLUSH
5.0000 mL | Freq: Three times a day (TID) | INTRAVENOUS | Status: DC
  Administered 2017-01-28 – 2017-02-02 (×11): 5 mL via INTRAVENOUS

## 2017-01-28 MED ORDER — FAT EMULSION 20 % IV EMUL
240.0000 mL | INTRAVENOUS | Status: AC
  Administered 2017-01-28: 240 mL via INTRAVENOUS
  Filled 2017-01-28: qty 250

## 2017-01-28 MED ORDER — TRACE MINERALS CR-CU-MN-SE-ZN 10-1000-500-60 MCG/ML IV SOLN
INTRAVENOUS | Status: AC
  Administered 2017-01-28: 18:00:00 via INTRAVENOUS
  Filled 2017-01-28: qty 2400

## 2017-01-28 NOTE — Progress Notes (Signed)
  Subjective: Says chest drain placement was painful yesterday  Objective: Vital signs in last 24 hours: Temp:  [97.5 F (36.4 C)-98.1 F (36.7 C)] 98.1 F (36.7 C) (09/12 0445) Pulse Rate:  [94-108] 94 (09/12 0445) Cardiac Rhythm: Sinus tachycardia (09/11 1415) Resp:  [14-97] 18 (09/12 0445) BP: (121-147)/(76-98) 143/87 (09/12 0445) SpO2:  [94 %-98 %] 94 % (09/12 0445)  Hemodynamic parameters for last 24 hours:    Intake/Output from previous day: 09/11 0701 - 09/12 0700 In: 2793 [P.O.:240; I.V.:2273; IV Piggyback:210] Out: 4480 [Urine:3400; Drains:1080] Intake/Output this shift: No intake/output data recorded.  General appearance: alert, cooperative and no distress Heart: regular rate and rhythm Lungs: diminished breath sounds bibasilar serosanguinous drainage from CT  Lab Results:  Recent Labs  01/26/17 0433 01/27/17 0551  WBC 5.5 5.4  HGB 8.2* 8.2*  HCT 27.9* 27.3*  PLT 432* 444*   BMET:  Recent Labs  01/26/17 0433 01/27/17 0551  NA 133* 133*  K 4.0 4.0  CL 98* 98*  CO2 28 28  GLUCOSE 137* 158*  BUN 9 8  CREATININE 0.48* 0.48*  CALCIUM 8.3* 8.1*    PT/INR: No results for input(s): LABPROT, INR in the last 72 hours. ABG    Component Value Date/Time   TCO2 20 04/16/2015 1828   CBG (last 3)   Recent Labs  01/27/17 2004 01/28/17 0116 01/28/17 0404  GLUCAP 142* 137* 122*    Assessment/Plan: S/P  -  Pigtail drain placed left pleural space yesterday Drained about 400 ml  CXR looks better I would be in favor of thrombolytics to try to drain any residual fluid/ exudate but will defer decision to Pulmonary   LOS: 13 days    Loreli SlotSteven C Matis Monnier 01/28/2017

## 2017-01-28 NOTE — Progress Notes (Signed)
INFECTIOUS DISEASE PROGRESS NOTE  ID: Albert White is a 41 y.o. male with  Active Problems:   Postprocedural intraabdominal abscess   Pleural effusion on left   Peritonitis (HCC)   Gastrointestinal anastomotic leak  Subjective: C/o abd pain.   Abtx:  Anti-infectives    Start     Dose/Rate Route Frequency Ordered Stop   01/27/17 1900  ertapenem (INVANZ) 1 g in sodium chloride 0.9 % 50 mL IVPB     1 g 100 mL/hr over 30 Minutes Intravenous Every 24 hours 01/27/17 1836     01/23/17 1500  metroNIDAZOLE (FLAGYL) IVPB 500 mg     500 mg 100 mL/hr over 60 Minutes Intravenous Every 8 hours 01/23/17 1353     01/23/17 1500  ceFEPIme (MAXIPIME) 2 g in dextrose 5 % 50 mL IVPB  Status:  Discontinued     2 g 100 mL/hr over 30 Minutes Intravenous Every 8 hours 01/23/17 1357 01/27/17 1835   01/22/17 2000  vancomycin (VANCOCIN) 1,250 mg in sodium chloride 0.9 % 250 mL IVPB  Status:  Discontinued     1,250 mg 166.7 mL/hr over 90 Minutes Intravenous Every 8 hours 01/22/17 1202 01/23/17 1358   01/22/17 1230  vancomycin (VANCOCIN) 2,500 mg in sodium chloride 0.9 % 500 mL IVPB     2,500 mg 250 mL/hr over 120 Minutes Intravenous  Once 01/22/17 1147 01/22/17 1438   01/15/17 1815  piperacillin-tazobactam (ZOSYN) IVPB 3.375 g  Status:  Discontinued     3.375 g 12.5 mL/hr over 240 Minutes Intravenous Every 8 hours 01/15/17 1804 01/15/17 1804   01/15/17 1630  piperacillin-tazobactam (ZOSYN) IVPB 3.375 g  Status:  Discontinued     3.375 g 12.5 mL/hr over 240 Minutes Intravenous Every 8 hours 01/15/17 1616 01/23/17 1354      Medications:  Scheduled: . acetaminophen  1,000 mg Oral Q8H  . chlorhexidine  15 mL Mouth Rinse BID  . Chlorhexidine Gluconate Cloth  6 each Topical Daily  . enoxaparin (LOVENOX) injection  40 mg Subcutaneous Q12H  . hydrocortisone   Rectal BID  . insulin aspart  0-20 Units Subcutaneous Q4H  . mouth rinse  15 mL Mouth Rinse q12n4p  . pneumococcal 23 valent vaccine  0.5 mL  Intramuscular Tomorrow-1000  . protein supplement shake  2 oz Oral QID  . sodium chloride flush  5 mL Intravenous Q8H  . sodium chloride flush  5 mL Intravenous Q8H  . sodium chloride flush  5 mL Intravenous Q8H    Objective: Vital signs in last 24 hours: Temp:  [98 F (36.7 C)-98.1 F (36.7 C)] 98.1 F (36.7 C) (09/12 0445) Pulse Rate:  [94-107] 94 (09/12 0445) Resp:  [18] 18 (09/12 0445) BP: (134-143)/(77-87) 143/87 (09/12 0445) SpO2:  [94 %-96 %] 94 % (09/12 0445)   General appearance: alert, cooperative and no distress Resp: clear to auscultation bilaterally Cardio: regular rate and rhythm GI: normal findings: bowel sounds normal, soft, non-tender and multiple drains.  Extremities: RUE PIC, clean.   Lab Results  Recent Labs  01/26/17 0433 01/27/17 0551  WBC 5.5 5.4  HGB 8.2* 8.2*  HCT 27.9* 27.3*  NA 133* 133*  K 4.0 4.0  CL 98* 98*  CO2 28 28  BUN 9 8  CREATININE 0.48* 0.48*   Liver Panel  Recent Labs  01/26/17 0433 01/27/17 1937  PROT 6.4*  --   ALBUMIN 1.9* 1.7*  AST 18  --   ALT 15*  --   ALKPHOS  116  --   BILITOT 0.4  --    Sedimentation Rate No results for input(s): ESRSEDRATE in the last 72 hours. C-Reactive Protein No results for input(s): CRP in the last 72 hours.  Microbiology: Recent Results (from the past 240 hour(s))  Aerobic/Anaerobic Culture (surgical/deep wound)     Status: None   Collection Time: 01/21/17 10:04 AM  Result Value Ref Range Status   Specimen Description ABSCESS  Final   Special Requests DRAIN POST CT GUIDED PLACEMENT  Final   Gram Stain   Final    ABUNDANT WBC PRESENT,BOTH PMN AND MONONUCLEAR ABUNDANT GRAM POSITIVE COCCI IN PAIRS ABUNDANT GRAM VARIABLE ROD    Culture   Final    ABUNDANT CITROBACTER FREUNDII FEW VIRIDANS STREPTOCOCCUS CALL MICROBIOLOGY LAB IF SENSITIVITIES ARE REQUIRED. ABUNDANT PREVOTELLA DENTICOLA BETA LACTAMASE POSITIVE    Report Status 01/25/2017 FINAL  Final   Organism ID, Bacteria  CITROBACTER FREUNDII  Final      Susceptibility   Citrobacter freundii - MIC*    CEFAZOLIN >=64 RESISTANT Resistant     CEFEPIME <=1 SENSITIVE Sensitive     CEFTAZIDIME >=64 RESISTANT Resistant     CEFTRIAXONE 16 INTERMEDIATE Intermediate     CIPROFLOXACIN <=0.25 SENSITIVE Sensitive     GENTAMICIN <=1 SENSITIVE Sensitive     IMIPENEM <=0.25 SENSITIVE Sensitive     TRIMETH/SULFA <=20 SENSITIVE Sensitive     PIP/TAZO 64 INTERMEDIATE Intermediate     * ABUNDANT CITROBACTER FREUNDII  Gram stain     Status: None   Collection Time: 01/23/17 12:53 PM  Result Value Ref Range Status   Specimen Description PLEURAL LEFT  Final   Special Requests NONE  Final   Gram Stain NO WBC SEEN NO ORGANISMS SEEN   Final   Report Status 01/23/2017 FINAL  Final  Culture, body fluid-bottle     Status: None   Collection Time: 01/23/17 12:53 PM  Result Value Ref Range Status   Specimen Description PLEURAL LEFT  Final   Special Requests NONE  Final   Culture NO GROWTH 5 DAYS  Final   Report Status 01/28/2017 FINAL  Final  Aerobic/Anaerobic Culture (surgical/deep wound)     Status: None (Preliminary result)   Collection Time: 01/27/17  1:58 PM  Result Value Ref Range Status   Specimen Description ABSCESS PLEURAL LEFT  Final   Special Requests SYRINGE  Final   Gram Stain   Final    RARE WBC PRESENT,BOTH PMN AND MONONUCLEAR NO ORGANISMS SEEN    Culture NO GROWTH < 24 HOURS  Final   Report Status PENDING  Incomplete  Aerobic/Anaerobic Culture (surgical/deep wound)     Status: None (Preliminary result)   Collection Time: 01/27/17  2:34 PM  Result Value Ref Range Status   Specimen Description ABDOMEN  Final   Special Requests RIGHT PERIHEPATIC FLUID  Final   Gram Stain   Final    MODERATE WBC PRESENT,BOTH PMN AND MONONUCLEAR NO ORGANISMS SEEN    Culture NO GROWTH < 24 HOURS  Final   Report Status PENDING  Incomplete    Studies/Results: Dg Chest Port 1 View  Result Date: 01/28/2017 CLINICAL DATA:   Follow-up bilateral pleural effusions with chest tube treatment. The patient underwent recent bariatric surgery. EXAM: PORTABLE CHEST 1 VIEW COMPARISON:  Portable chest x-ray of January 27, 2017 FINDINGS: The lungs are well-expanded. Pleural fluid layer sposteriorly and laterally, bilaterally. The small caliber pigtail pleural drainage catheters are in stable position at the lung bases. The hemidiaphragms remain  obscured. The cardiac silhouette is mildly enlarged. The central pulmonary vascularity is prominent. The interstitial markings remain increased bilaterally The PICC line tip projects over the proximal SVC. The bony thorax exhibits no acute abnormality. IMPRESSION: Stable appearance of the chest since yesterday's study. Persistent bilateral pleural effusions layering inferiorly and posteriorly. Mild cardiomegaly and central pulmonary vascular congestion with mild pulmonary interstitial edema. Electronically Signed   By: David  [place] M.D.   On: 01/28/2017 07:36   Dg Chest Port 1 View  Result Date: 01/27/2017 CLINICAL DATA:  Pleural effusions EXAM: PORTABLE CHEST 1 VIEW COMPARISON:  01/26/2017 FINDINGS: Pigtail catheters project over each lung base with hazy adjacent opacities presumably due to layering effusions and adjacent atelectasis. No pneumothorax is identified. Right-sided PICC line tip is seen in the mid SVC. Heart is borderline enlarged with mild vascular congestion. No acute nor suspicious osseous abnormalities. IMPRESSION: 1. New pigtail catheters are seen at each lung base superimposed on presumed layering pleural effusions and atelectasis. 2. No pneumothorax. 3. Stable cardiomegaly. Electronically Signed   By: Tollie Eth M.D.   On: 01/27/2017 19:54   Ct Image Guided Drainage By Percutaneous Catheter  Result Date: 01/27/2017 INDICATION: 41 year old male with a history of gastric bypass surgery and multiple intra-abdominal fluid collections and exited infusion of the left chest. EXAM:  CT-GUIDED DRAIN OF RIGHT PERI HEPATIC/SUBCAPSULAR FLUID. CT-GUIDED LEFT CHEST TUBE PLACEMENT MEDICATIONS: The patient is currently admitted to the hospital and receiving intravenous antibiotics. The antibiotics were administered within an appropriate time frame prior to the initiation of the procedure. ANESTHESIA/SEDATION: 3.0 mg IV Versed 200 mcg IV Fentanyl Moderate Sedation Time:  31 minutes The patient was continuously monitored during the procedure by the interventional radiology nurse under my direct supervision. COMPLICATIONS: None TECHNIQUE: Informed written consent was obtained from the patient after a thorough discussion of the procedural risks, benefits and alternatives. All questions were addressed. Maximal Sterile Barrier Technique was utilized including caps, mask, sterile gowns, sterile gloves, sterile drape, hand hygiene and skin antiseptic. A timeout was performed prior to the initiation of the procedure. PROCEDURE: Patient is positioned supine position on the CT gantry table. Scout CT image of the upper abdomen and lower chest performed for planning purposes. The right upper abdomen was addressed. The right upper abdomen was prepped with chlorhexidine in a sterile fashion, and a sterile drape was applied covering the operative field. A sterile gown and sterile gloves were used for the procedure. Local anesthesia was provided with 1% Lidocaine. Once the patient is prepped and draped in the usual sterile fashion, the skin and subcutaneous tissues were generously infiltrated 1% lidocaine for local anesthesia. Using a trocar needle, the fluid at the right peripheral liver was approach. Once we confirmed the needle tip within the fluid, modified Seldinger technique was used to place a 12 [place] drain. Drain was sutured in position and attached to gravity drainage. The left chest was then addressed. The lower left chest was prepped with chlorhexidine in a sterile fashion common sterile drape was applied  covering the operative field. Sterile gown and gloves were used. Local anesthesia provided with 1% lidocaine. Once the patient was prepped and draped in the usual sterile fashion, the skin and subcutaneous tissues were generously infiltrated 1% lidocaine for local anesthesia. Using CT guidance, trocar needle was advanced into the fluid of the left pleural space. Using modified Seldinger technique, a 10 French drain was placed. This was attached to water seal chamber. Culture from the abdomen and the chest were  sent to the lab. Final CT image was acquired. Patient tolerated the procedure well and remained hemodynamically stable throughout. No complications were encountered and no significant blood loss. FINDINGS: Initial CT image demonstrates perihepatic fluid collection of the right aspect of the liver, likely subcapsular given the configuration. Unchanged from prior. Drainage catheter terminates in the midline adjacent to the surgical changes of gastric bypass, similar prior. Small fluid adjacent to the spleen. Unchanged appearance of left pleural fluid. Status post drainage of right upper abdomen and left chest fluid, decreased size of the fluid collection of the right abdomen, with unchanged size of fluid collection in the chest. Paragraphs sample was sent for me age fluid location. IMPRESSION: Status post CT-guided 12 French drain placement of the right upper abdomen, and CT-guided left thoracostomy tube with 10 French drain placed. Sample was sent to the lab for culture from each location. Signed, Yvone NeuJaime S. Loreta AveWagner, DO [facility] Electronically Signed   By: Gilmer MorJaime  Wagner D.O.   On: 01/27/2017 15:32   Ct Image Guided Drainage By Percutaneous Catheter  Result Date: 01/27/2017 INDICATION: 41 year old male with a history of gastric bypass surgery and multiple intra-abdominal fluid collections and exited infusion of the left chest. EXAM: CT-GUIDED DRAIN  OF RIGHT PERI HEPATIC/SUBCAPSULAR FLUID. CT-GUIDED LEFT CHEST TUBE PLACEMENT MEDICATIONS: The patient is currently admitted to the hospital and receiving intravenous antibiotics. The antibiotics were administered within an appropriate time frame prior to the initiation of the procedure. ANESTHESIA/SEDATION: 3.0 mg IV Versed 200 mcg IV Fentanyl Moderate Sedation Time:  31 minutes The patient was continuously monitored during the procedure by the interventional radiology nurse under my direct supervision. COMPLICATIONS: None TECHNIQUE: Informed written consent was obtained from the patient after a thorough discussion of the procedural risks, benefits and alternatives. All questions were addressed. Maximal Sterile Barrier Technique was utilized including caps, mask, sterile gowns, sterile gloves, sterile drape, hand hygiene and skin antiseptic. A timeout was performed prior to the initiation of the procedure. PROCEDURE: Patient is positioned supine position on the CT gantry table. Scout CT image of the upper abdomen and lower chest performed for planning purposes. The right upper abdomen was addressed. The right upper abdomen was prepped with chlorhexidine in a sterile fashion, and a sterile drape was applied covering the operative field. A sterile gown and sterile gloves were used for the procedure. Local anesthesia was provided with 1% Lidocaine. Once the patient is prepped and draped in the usual sterile fashion, the skin and subcutaneous tissues were generously infiltrated 1% lidocaine for local anesthesia. Using a trocar needle, the fluid at the right peripheral liver was approach. Once we confirmed the needle tip within the fluid, modified Seldinger technique was used to place a 12 [place] drain. Drain was sutured in position and attached to gravity drainage. The left chest was then addressed. The lower left chest was prepped with chlorhexidine in a sterile fashion common sterile drape was applied covering the  operative field. Sterile gown and gloves were used. Local anesthesia provided with 1% lidocaine. Once the patient was prepped and draped in the usual sterile fashion, the skin and subcutaneous tissues were generously infiltrated 1% lidocaine for local anesthesia. Using CT guidance, trocar needle was advanced into the fluid of the left pleural space. Using modified Seldinger technique, a 10 French drain was placed. This was attached to water seal chamber. Culture from the abdomen and the chest were sent to the lab. Final CT image was acquired. Patient tolerated  the procedure well and remained hemodynamically stable throughout. No complications were encountered and no significant blood loss. FINDINGS: Initial CT image demonstrates perihepatic fluid collection of the right aspect of the liver, likely subcapsular given the configuration. Unchanged from prior. Drainage catheter terminates in the midline adjacent to the surgical changes of gastric bypass, similar prior. Small fluid adjacent to the spleen. Unchanged appearance of left pleural fluid. Status post drainage of right upper abdomen and left chest fluid, decreased size of the fluid collection of the right abdomen, with unchanged size of fluid collection in the chest. Paragraphs sample was sent for me age fluid location. IMPRESSION: Status post CT-guided 12 French drain placement of the right upper abdomen, and CT-guided left thoracostomy tube with 10 French drain placed. Sample was sent to the lab for culture from each location. Signed, Yvone Neu. Loreta Ave, DO [facility] Electronically Signed   By: Gilmer Mor D.O.   On: 01/27/2017 15:32     Assessment/Plan: Abdominal abscesses  S/P Gastric surgery 12-08-16  DM2 x1 year Morbid obesity  Total days of antibiotics: 13 cefepime/flagyl ---> invanz/flagyl  Will work on seeing if we can stop flagyl for gardnerella.  He is otherwise doing well.    His Glc are well controlled.          Johny Sax Infectious Diseases (pager) [phone] [email or web address] 01/28/2017, 3:09 PM  LOS: 13 days

## 2017-01-28 NOTE — Progress Notes (Signed)
Subjective No acute events; no new complaints. IR yesterday for 2 new perc drains - 1 in left chest exudative effusion; 1 in right abd perihepatic abscess. Tolerated procedure well. Is not ambulating since placement of chest tube. No n/v. Tolerating clears  Objective: Vital signs in last 24 hours: Temp:  [97.5 F (36.4 C)-98.1 F (36.7 C)] 98.1 F (36.7 C) (09/12 0445) Pulse Rate:  [94-108] 94 (09/12 0445) Resp:  [14-97] 18 (09/12 0445) BP: (121-147)/(76-98) 143/87 (09/12 0445) SpO2:  [94 %-98 %] 94 % (09/12 0445) Last BM Date: 01/26/17  Intake/Output from previous day: 09/11 0701 - 09/12 0700 In: 2793 [P.O.:240; I.V.:2273; IV Piggyback:210] Out: 4480 [Urine:3400; Drains:1080] Intake/Output this shift: No intake/output data recorded.  Gen: NAD, comfortable CV: RRR Pulm: Normal work of breathing Abd: Obese, soft; mildy ttp in bilateral upper quadrants. Drains: All with cloudy/turbid output Ext: SCDs in place  Lab Results: CBC   Recent Labs  01/26/17 0433 01/27/17 0551  WBC 5.5 5.4  HGB 8.2* 8.2*  HCT 27.9* 27.3*  PLT 432* 444*   BMET  Recent Labs  01/26/17 0433 01/27/17 0551  NA 133* 133*  K 4.0 4.0  CL 98* 98*  CO2 28 28  GLUCOSE 137* 158*  BUN 9 8  CREATININE 0.48* 0.48*  CALCIUM 8.3* 8.1*   Anti-infectives: Anti-infectives    Start     Dose/Rate Route Frequency Ordered Stop   01/27/17 1900  ertapenem (INVANZ) 1 g in sodium chloride 0.9 % 50 mL IVPB     1 g 100 mL/hr over 30 Minutes Intravenous Every 24 hours 01/27/17 1836     01/23/17 1500  metroNIDAZOLE (FLAGYL) IVPB 500 mg     500 mg 100 mL/hr over 60 Minutes Intravenous Every 8 hours 01/23/17 1353     01/23/17 1500  ceFEPIme (MAXIPIME) 2 g in dextrose 5 % 50 mL IVPB  Status:  Discontinued     2 g 100 mL/hr over 30 Minutes Intravenous Every 8 hours 01/23/17 1357 01/27/17 1835   01/22/17 2000  vancomycin (VANCOCIN) 1,250 mg in sodium chloride 0.9 % 250 mL IVPB  Status:  Discontinued     1,250  mg 166.7 mL/hr over 90 Minutes Intravenous Every 8 hours 01/22/17 1202 01/23/17 1358   01/22/17 1230  vancomycin (VANCOCIN) 2,500 mg in sodium chloride 0.9 % 500 mL IVPB     2,500 mg 250 mL/hr over 120 Minutes Intravenous  Once 01/22/17 1147 01/22/17 1438   01/15/17 1815  piperacillin-tazobactam (ZOSYN) IVPB 3.375 g  Status:  Discontinued     3.375 g 12.5 mL/hr over 240 Minutes Intravenous Every 8 hours 01/15/17 1804 01/15/17 1804   01/15/17 1630  piperacillin-tazobactam (ZOSYN) IVPB 3.375 g  Status:  Discontinued     3.375 g 12.5 mL/hr over 240 Minutes Intravenous Every 8 hours 01/15/17 1616 01/23/17 1354       Assessment/Plan: Patient Active Problem List   Diagnosis Date Noted  . Peritonitis (HCC)   . Gastrointestinal anastomotic leak   . Pleural effusion on left   . Postprocedural intraabdominal abscess 01/15/2017  . DM (diabetes mellitus) (HCC) 02/29/2016  . HLD (hyperlipidemia) 02/29/2016  . Cellulitis and abscess of neck 02/29/2016  . Cellulitis 12/14/2013  . Morbid obesity (HCC) 12/14/2013  . Abscess 12/14/2013  . Hidradenitis suppurativa, neck and scalp 08/09/2012   Status post Roux-en-Y/gastric bypass,12/08/16, Dr. Clent Ridges Leak at gastrojejunostomy with multiple abscesses.  S/P perc drain 01/16/17 S/p IR drain 01/21/17 - 100 cc purulent fluid drained in the  IR suite S/p IR thoracentesis 01/23/17 =>> 25 cc  S/p IR Perc drain R perihepatic abscess 9/11 S/p IR perc drain L chest exudative effusion 9/11  Stable today with no new findings or complaints.  DM HLD Morbid Obesity Body mass index:47.6 OSA with hx of CPAP FEN - on clear liquids, TPN VTE - SCDs, lovenox ID - IV Zosyn 8/30-01/23/17 Vancomycin 9/6-01/23/17 Flagyl/cefipime 9/7 =>>4 days; changed to invanz/flagyl per ID 9/11 --> Day2  Plan: Continue current plan of care; follow cultures; possible thrombolytic instillation via CT - decision to be made by pulm    LOS: 13 days   Andria Meusehristopher M [place] Sloop,  MD [facility]

## 2017-01-28 NOTE — Progress Notes (Signed)
PULMONARY / CRITICAL CARE MEDICINE   Name: Albert White MRN: [id number] DOB: 07-21-1975    ADMISSION DATE:  01/15/2017 CONSULTATION DATE:  01/25/2017  REFERRING MD:  Dr. Dorris Fetch  CHIEF COMPLAINT: Left  Pleural effusion  Brief patient summary:  41 yo male had roux en y surgery at [facility] July 23.  He developed scrotal swelling and pain.  Found to have hydrocele and varicocele.  He was then found to have abdominal wall hematoma, fever 103F, and seroma.  He had CT abd which showed abscess.  He was admitted to [facility] and seen by surgery.  He had drain placed by IR and started on Abx.  He was noted to have b/l pleural effusions.  He had attempted thoracentesis with only 25 ml of fluid able to be removed.  He was seen by thoracic surgery, who requested a PCCM consult.  SUBJECTIVE/OVERNIGHT/INTERVAL HX Refused CPAP overnight. C/o of soreness around pleural site, patient does not feel SOB has improved much since insertion of CT Total serosang of CT drainage, however only 10 ml since 1900, no flush  VITAL SIGNS: BP (!) 143/87 (BP Location: Left Arm)   Pulse 94   Temp 98.1 F (36.7 C) (Oral)   Resp 18   Ht  (1.778 m)   Wt (!) 340 lb 12.8 oz (154.6 kg)   SpO2 94%   BMI 48.90 kg/m   INTAKE / OUTPUT: I/O last 3 completed shifts: In: 4862 [P.O.:240; I.V.:4062; Other:80; IV Piggyback:480] Out: 5845 [Urine:4700; Drains:1145]  PHYSICAL EXAMINATION: General:  Adult male lying in bed in NAD HEENT: MM pink/moist Neuro: AOx 3, nonfocal CV: rrr, no m/r/g PULM: even/non-labored, lungs bilaterally clear, diminished in bases bibasilar, pigtail to left lateral- to 20cm sxn, no airleak, no fluctuation GI: obese, soft, bs, active, x 3 abd drains (RUQ, epigastric, and RLQ)  Extremities: warm/dry, SCDs, trace LE edema  Skin: no rashes  LABS:  BMET  Recent Labs Lab 01/25/17 0458 01/26/17 0433 01/27/17 0551  NA 135 133* 133*  K 4.0 4.0 4.0  CL 100* 98* 98*  CO2  BUN CREATININE 0.46* 0.48* 0.48*  GLUCOSE 160* 137* 158*    Electrolytes  Recent Labs Lab 01/24/17 0501 01/25/17 0458 01/26/17 0433 01/27/17 0551  CALCIUM 8.1* 8.2* 8.3* 8.1*  MG 1.7  --  1.7 1.7  PHOS 3.9  --  4.7* 4.4    CBC  Recent Labs Lab 01/25/17 0458 01/26/17 0433 01/27/17 0551  WBC 4.7 5.5 5.4  HGB 8.3* 8.2* 8.2*  HCT 27.9* 27.9* 27.3*  PLT 418* 432* 444*    Coag's  Recent Labs Lab 01/23/17 0723  INR 1.12    Sepsis Markers No results for input(s): LATICACIDVEN, PROCALCITON, O2SATVEN in the last 168 hours.  ABG No results for input(s): PHART, PCO2ART, PO2ART in the last 168 hours.  Liver Enzymes  Recent Labs Lab 01/22/17 0553 01/23/17 1248 01/26/17 0433 01/27/17 1937  AST --   ALT 18 19 15*  --   ALKPHOS 139* 175* 116  --   BILITOT 0.6 0.8 0.4  --   ALBUMIN 1.6* 1.9* 1.9* 1.7*    Cardiac Enzymes No results for input(s): TROPONINI, PROBNP in the last 168 hours.  Glucose  Recent Labs Lab 01/27/17 0749 01/27/17 1615 01/27/17 2004 01/28/17 0116 01/28/17 0404 01/28/17 0738  GLUCAP 150* 121* 142* 137* 122* 142*    Imaging Dg Chest Port 1 View  Result  Date: 01/28/2017 CLINICAL DATA:  Follow-up bilateral pleural effusions with chest tube treatment. The patient underwent recent bariatric surgery. EXAM: PORTABLE CHEST 1 VIEW COMPARISON:  Portable chest x-ray of January 27, 2017 FINDINGS: The lungs are well-expanded. Pleural fluid layer sposteriorly and laterally, bilaterally. The small caliber pigtail pleural drainage catheters are in stable position at the lung bases. The hemidiaphragms remain obscured. The cardiac silhouette is mildly enlarged. The central pulmonary vascularity is prominent. The interstitial markings remain increased bilaterally The PICC line tip projects over the proximal SVC. The bony thorax exhibits no acute abnormality. IMPRESSION: Stable appearance of the chest since yesterday's study.  Persistent bilateral pleural effusions layering inferiorly and posteriorly. Mild cardiomegaly and central pulmonary vascular congestion with mild pulmonary interstitial edema. Electronically Signed   By: David  [place] M.D.   On: 01/28/2017 07:36   Dg Chest Port 1 View  Result Date: 01/27/2017 CLINICAL DATA:  Pleural effusions EXAM: PORTABLE CHEST 1 VIEW COMPARISON:  01/26/2017 FINDINGS: Pigtail catheters project over each lung base with hazy adjacent opacities presumably due to layering effusions and adjacent atelectasis. No pneumothorax is identified. Right-sided PICC line tip is seen in the mid SVC. Heart is borderline enlarged with mild vascular congestion. No acute nor suspicious osseous abnormalities. IMPRESSION: 1. New pigtail catheters are seen at each lung base superimposed on presumed layering pleural effusions and atelectasis. 2. No pneumothorax. 3. Stable cardiomegaly. Electronically Signed   By: Tollie Eth M.D.   On: 01/27/2017 19:54   Ct Image Guided Drainage By Percutaneous Catheter  Result Date: 01/27/2017 INDICATION: 41 year old male with a history of gastric bypass surgery and multiple intra-abdominal fluid collections and exited infusion of the left chest. EXAM: CT-GUIDED DRAIN OF RIGHT PERI HEPATIC/SUBCAPSULAR FLUID. CT-GUIDED LEFT CHEST TUBE PLACEMENT MEDICATIONS: The patient is currently admitted to the hospital and receiving intravenous antibiotics. The antibiotics were administered within an appropriate time frame prior to the initiation of the procedure. ANESTHESIA/SEDATION: 3.0 mg IV Versed 200 mcg IV Fentanyl Moderate Sedation Time:  31 minutes The patient was continuously monitored during the procedure by the interventional radiology nurse under my direct supervision. COMPLICATIONS: None TECHNIQUE: Informed written consent was obtained from the patient after a thorough discussion of the procedural risks, benefits and alternatives. All questions were addressed. Maximal Sterile  Barrier Technique was utilized including caps, mask, sterile gowns, sterile gloves, sterile drape, hand hygiene and skin antiseptic. A timeout was performed prior to the initiation of the procedure. PROCEDURE: Patient is positioned supine position on the CT gantry table. Scout CT image of the upper abdomen and lower chest performed for planning purposes. The right upper abdomen was addressed. The right upper abdomen was prepped with chlorhexidine in a sterile fashion, and a sterile drape was applied covering the operative field. A sterile gown and sterile gloves were used for the procedure. Local anesthesia was provided with 1% Lidocaine. Once the patient is prepped and draped in the usual sterile fashion, the skin and subcutaneous tissues were generously infiltrated 1% lidocaine for local anesthesia. Using a trocar needle, the fluid at the right peripheral liver was approach. Once we confirmed the needle tip within the fluid, modified Seldinger technique was used to place a 12 [place] drain. Drain was sutured in position and attached to gravity drainage. The left chest was then addressed. The lower left chest was prepped with chlorhexidine in a sterile fashion common sterile drape was applied covering the operative field. Sterile gown and gloves were used. Local anesthesia provided with 1% lidocaine. Once the  patient was prepped and draped in the usual sterile fashion, the skin and subcutaneous tissues were generously infiltrated 1% lidocaine for local anesthesia. Using CT guidance, trocar needle was advanced into the fluid of the left pleural space. Using modified Seldinger technique, a 10 French drain was placed. This was attached to water seal chamber. Culture from the abdomen and the chest were sent to the lab. Final CT image was acquired. Patient tolerated the procedure well and remained hemodynamically stable throughout. No complications were encountered and no significant blood loss. FINDINGS: Initial CT  image demonstrates perihepatic fluid collection of the right aspect of the liver, likely subcapsular given the configuration. Unchanged from prior. Drainage catheter terminates in the midline adjacent to the surgical changes of gastric bypass, similar prior. Small fluid adjacent to the spleen. Unchanged appearance of left pleural fluid. Status post drainage of right upper abdomen and left chest fluid, decreased size of the fluid collection of the right abdomen, with unchanged size of fluid collection in the chest. Paragraphs sample was sent for me age fluid location. IMPRESSION: Status post CT-guided 12 French drain placement of the right upper abdomen, and CT-guided left thoracostomy tube with 10 French drain placed. Sample was sent to the lab for culture from each location. Signed, Yvone Neu. Loreta Ave, DO [facility] Electronically Signed   By: Gilmer Mor D.O.   On: 01/27/2017 15:32   Ct Image Guided Drainage By Percutaneous Catheter  Result Date: 01/27/2017 INDICATION: 41 year old male with a history of gastric bypass surgery and multiple intra-abdominal fluid collections and exited infusion of the left chest. EXAM: CT-GUIDED DRAIN OF RIGHT PERI HEPATIC/SUBCAPSULAR FLUID. CT-GUIDED LEFT CHEST TUBE PLACEMENT MEDICATIONS: The patient is currently admitted to the hospital and receiving intravenous antibiotics. The antibiotics were administered within an appropriate time frame prior to the initiation of the procedure. ANESTHESIA/SEDATION: 3.0 mg IV Versed 200 mcg IV Fentanyl Moderate Sedation Time:  31 minutes The patient was continuously monitored during the procedure by the interventional radiology nurse under my direct supervision. COMPLICATIONS: None TECHNIQUE: Informed written consent was obtained from the patient after a thorough discussion of the procedural risks, benefits and alternatives. All questions were addressed. Maximal Sterile Barrier  Technique was utilized including caps, mask, sterile gowns, sterile gloves, sterile drape, hand hygiene and skin antiseptic. A timeout was performed prior to the initiation of the procedure. PROCEDURE: Patient is positioned supine position on the CT gantry table. Scout CT image of the upper abdomen and lower chest performed for planning purposes. The right upper abdomen was addressed. The right upper abdomen was prepped with chlorhexidine in a sterile fashion, and a sterile drape was applied covering the operative field. A sterile gown and sterile gloves were used for the procedure. Local anesthesia was provided with 1% Lidocaine. Once the patient is prepped and draped in the usual sterile fashion, the skin and subcutaneous tissues were generously infiltrated 1% lidocaine for local anesthesia. Using a trocar needle, the fluid at the right peripheral liver was approach. Once we confirmed the needle tip within the fluid, modified Seldinger technique was used to place a 12 [place] drain. Drain was sutured in position and attached to gravity drainage. The left chest was then addressed. The lower left chest was prepped with chlorhexidine in a sterile fashion common sterile drape was applied covering the operative field. Sterile gown and gloves were used. Local anesthesia provided with 1% lidocaine. Once the patient was prepped and draped in the usual sterile fashion, the  skin and subcutaneous tissues were generously infiltrated 1% lidocaine for local anesthesia. Using CT guidance, trocar needle was advanced into the fluid of the left pleural space. Using modified Seldinger technique, a 10 French drain was placed. This was attached to water seal chamber. Culture from the abdomen and the chest were sent to the lab. Final CT image was acquired. Patient tolerated the procedure well and remained hemodynamically stable throughout. No complications were encountered and no significant blood loss. FINDINGS: Initial CT image  demonstrates perihepatic fluid collection of the right aspect of the liver, likely subcapsular given the configuration. Unchanged from prior. Drainage catheter terminates in the midline adjacent to the surgical changes of gastric bypass, similar prior. Small fluid adjacent to the spleen. Unchanged appearance of left pleural fluid. Status post drainage of right upper abdomen and left chest fluid, decreased size of the fluid collection of the right abdomen, with unchanged size of fluid collection in the chest. Paragraphs sample was sent for me age fluid location. IMPRESSION: Status post CT-guided 12 French drain placement of the right upper abdomen, and CT-guided left thoracostomy tube with 10 French drain placed. Sample was sent to the lab for culture from each location. Signed, Yvone Neu. Loreta Ave, DO [facility] Radiology Electronically Signed   By: Gilmer Mor D.O.   On: 01/27/2017 15:32    STUDIES:  Lt pleural fluid 9/07 >> glucose 130, protein 3.8, LDH 453, WBC 3750 (54 N, 43 L) CT chest 9/08 >> lower lobe consolidation b/l, mod Lt effusion, small Rt effusion  CULTURES: Blood 8/30 >> neg Abdominal abscess 8/31 >> Streptococcus constellatus, gardnerella vaginalis Abdominal abscess 9/05 >> citrobacter freundii, viridans streptococcus Lt pleural fluid 9/07 >> ngtd x 4>> Left pleural GS 9/07 >> neg Abdominal abscess 9/11 >> Left pleural fluid 9/11 >> Left pleural GS 9/11>>  ANTIBIOTICS: Zosyn 8/30 >> 9/05 Vancomycin 9/05 >> 9/06 Cefepime 9/07 >>9/11 Flagyl 9/07 >>  Ertapenem 9/11 >>  SIGNIFICANT EVENTS: 8/31 Admit, surgery consulted, abscess drain by IR 9/04 2nd drain by IR 9/07 Lt thoracentesis 9/09 Pulmonary consulted  9/11 IR pig tail placement / Additional abdominal drain  LINES/TUBES: Rt PICC 8/31 >> IR PERC Left Pleural Pigtail CT 9/11 >> IR PERC Right abd perihepatic drain 9/11 >>  ASSESSMENT / PLAN:  Exudative Lt pleural  effusion- loculated -s/p IR guided pig tail 9/11 w/ serosanguinous -400 ml out since with some improvement in aeration on CXR  P:  Cook catheter flush order set initiated  Encourage mobilization CXR in am Depending on CT output overnight with flushing and mobilization, will determine if lytic therapy is needed Pleural fluid sent to micro, but not to lab.  Lab looking to see if pleural fluid available, if not will resend fluid from CT for pleural studies. Ensure adequate pain control Titrate oxygen to Maintain oxygen saturations> 94%  Goal net even/neg I/O balance  Posey Boyer, AGACNP-BC [facility] Pgr: [phone] or if no answer [phone] 01/28/2017, 10:04 AM  STAFF NOTE: I, Rory Percy, MD FACP have personally reviewed patient's available data, including medical history, events of note, physical examination and test results as part of my evaluation. I have discussed with resident/NP and other care providers such as pharmacist, RN and RRT. In addition, I personally evaluated patient and elicited key findings of:  Awake, in chair, much improved resp status, improved coarse BS, remains slight reduced left base, pcxr which I reviewed shows effusion reduced and improved aeration to some extent  on LLL, output is 40 cc then 10 cc now none, we need to flush the chest tube with 30 cc and see if obstructed (called IR to approve), if output improves then maintain suction, if output increased  But low, will consider DNASE infused in chest tube, would follow output first after flush, after review of CT it may be that total volume is about 500 cc likely >? I updated wife and pt in full, mobilize and walk if able, would favor neg balance with lasix as tolerated, ABX per ID, follow chest tube culture closely  Mcarthur Rossettianiel J. Tyson AliasFeinstein, MD, FACP Pgr: [phone] [facility] 01/28/2017 3:41 PM

## 2017-01-28 NOTE — Progress Notes (Signed)
Referring Physician(s):  Dr. Sheliah Hatch  Supervising Physician: Irish Lack  Patient Status:  [facility] - In-pt  Chief Complaint:  Abdominal abscesses  Subjective: Able to walk halls, sitting up in chair during visit.  Feels mild improvement after drains, but overall sore.  Allergies: Patient has no known allergies.  Medications: Prior to Admission medications   Medication Sig Start Date End Date Taking? Authorizing Provider  acetaminophen (TYLENOL) 325 MG tablet Take 2 tablets (650 mg total) by mouth every 8 (eight) hours. Patient taking differently: Take 650 mg by mouth every 8 (eight) hours as needed for moderate pain or headache.  02/27/16  Yes Mesner, Barbara Cower, MD  atorvastatin (LIPITOR) 10 MG tablet Take 10 mg by mouth every evening.  09/18/15  Yes [provider]  calcium carbonate (TUMS - DOSED IN MG ELEMENTAL CALCIUM) 500 MG chewable tablet Chew 500 mg by mouth as needed for heartburn.   Yes [provider]  nystatin (MYCOSTATIN) 100000 UNIT/ML suspension Take by mouth as needed. Thrush. 1 tsp 11/27/16  Yes [provider]  omeprazole (PRILOSEC) 20 MG capsule Take 20 mg by mouth 2 (two) times daily. 11/27/16  Yes [provider]  OVER THE COUNTER MEDICATION Apply 1 application topically as needed (eczema). [employer] Eczema Cream    Yes [provider]  oxyCODONE (ROXICODONE) 5 MG immediate release tablet Take 1 tablet (5 mg total) by mouth every 6 (six) hours as needed for severe pain. 03/05/16  Yes Ghimire, Werner Lean, MD  traZODone (DESYREL) 100 MG tablet Take 100 mg by mouth at bedtime.   Yes [provider]  ursodiol (ACTIGALL) 300 MG capsule Take 300 mg by mouth 2 (two) times daily. 11/27/16  Yes [provider]  cephALEXin (KEFLEX) 500 MG capsule Take 1 capsule (500 mg total) by mouth 3 (three) times daily. Patient not taking: Reported on 01/15/2017 03/05/16   Maretta Bees, MD  doxycycline (VIBRA-TABS) 100 MG  tablet Take 1 tablet (100 mg total) by mouth 2 (two) times daily. Patient not taking: Reported on 01/15/2017 03/05/16   Maretta Bees, MD  metFORMIN (GLUCOPHAGE) 500 MG tablet Take 2 tablets (1,000 mg total) by mouth 2 (two) times daily with a meal. Patient not taking: Reported on 01/15/2017 03/05/16   Maretta Bees, MD     Vital Signs: BP (!) 143/87 (BP Location: Left Arm)   Pulse 94   Temp 98.1 F (36.7 C) (Oral)   Resp 18   Ht  (1.778 m)   Wt (!) 340 lb 12.8 oz (154.6 kg)   SpO2 94%   BMI 48.90 kg/m   Physical Exam  NAD, alert Abd:   RLQ drain with increased blood-tinged output, intact, site clean and dry.   Peri-epigastric drain with thin, blood-tinged output, 10 mL out overnight Hepatic drain intact without erythema or warmth. Yellow, purulent output in bag Chest drain intact.  Serous fluid draining.  Dressing in place.   Imaging: Ct Chest Wo Contrast  Result Date: 01/24/2017 CLINICAL DATA:  Left pleural effusion status post thoracentesis EXAM: CT CHEST WITHOUT CONTRAST TECHNIQUE: Multidetector CT imaging of the chest was performed following the standard protocol without IV contrast. COMPARISON:  Radiograph 01/23/2017, CT abdomen pelvis 01/19/2017, 01/15/2017 FINDINGS: Cardiovascular: Limited evaluation without intravenous contrast. Non aneurysmal aorta. Right-sided central venous catheter tip is at the brachiocephalic confluence. Normal heart size. No significant pericardial effusion Mediastinum/Nodes: Midline trachea. No thyroid mass. No significantly enlarged mediastinal lymph nodes. Limited assessment of hilar adenopathy.  Esophagus within normal limits. Lungs/Pleura: Partial lower lobe consolidations may reflect atelectasis or pneumonia. Moderate left pleural effusion with some loculation anteriorly. Small right-sided pleural effusion. Negative for a pneumothorax Upper Abdomen: Interval placement of pigtail drainage catheter adjacent to the bypassed stomach with  decreased abscess collection since previous CT. Grossly stable perisplenic fluid collection measuring approximately 11.3 x 2.5 cm on axial images. Interval increase in a right perihepatic fluid collection, this measures 15 cm x 6.8 cm by 16.3 cm. Mass effect on the underlying liver. Musculoskeletal: No acute or suspicious bone lesion IMPRESSION: 1. Small right pleural effusion and moderate left pleural effusion with some loculation of the pleural fluid on the left. Negative for a pneumothorax. Partial consolidations in the lower lobes may reflect atelectasis or pneumonia 2. Interim placement of a pigtail drainage catheter adjacent to the gastric sutures/gastro jejunal anastomosis with resolution of previously noted gas and fluid collection 3. Interval increase in size of a right perihepatic fluid collection since the prior CT, measuring at least 16.3 cm. 4. Grossly stable left perisplenic fluid collection Electronically Signed   By: Jasmine Pang M.D.   On: 01/24/2017 19:52   Dg Chest Port 1 View  Result Date: 01/28/2017 CLINICAL DATA:  Follow-up bilateral pleural effusions with chest tube treatment. The patient underwent recent bariatric surgery. EXAM: PORTABLE CHEST 1 VIEW COMPARISON:  Portable chest x-ray of January 27, 2017 FINDINGS: The lungs are well-expanded. Pleural fluid layer sposteriorly and laterally, bilaterally. The small caliber pigtail pleural drainage catheters are in stable position at the lung bases. The hemidiaphragms remain obscured. The cardiac silhouette is mildly enlarged. The central pulmonary vascularity is prominent. The interstitial markings remain increased bilaterally The PICC line tip projects over the proximal SVC. The bony thorax exhibits no acute abnormality. IMPRESSION: Stable appearance of the chest since yesterday's study. Persistent bilateral pleural effusions layering inferiorly and posteriorly. Mild cardiomegaly and central pulmonary vascular congestion with mild  pulmonary interstitial edema. Electronically Signed   By: David  [place] M.D.   On: 01/28/2017 07:36   Dg Chest Port 1 View  Result Date: 01/27/2017 CLINICAL DATA:  Pleural effusions EXAM: PORTABLE CHEST 1 VIEW COMPARISON:  01/26/2017 FINDINGS: Pigtail catheters project over each lung base with hazy adjacent opacities presumably due to layering effusions and adjacent atelectasis. No pneumothorax is identified. Right-sided PICC line tip is seen in the mid SVC. Heart is borderline enlarged with mild vascular congestion. No acute nor suspicious osseous abnormalities. IMPRESSION: 1. New pigtail catheters are seen at each lung base superimposed on presumed layering pleural effusions and atelectasis. 2. No pneumothorax. 3. Stable cardiomegaly. Electronically Signed   By: Tollie Eth M.D.   On: 01/27/2017 19:54   Dg Chest Port 1 View  Result Date: 01/26/2017 CLINICAL DATA:  Pleural effusion EXAM: PORTABLE CHEST 1 VIEW COMPARISON:  01/23/2017 FINDINGS: Stable right upper extremity PICC. Stable bilateral pleural effusions and bilateral predominately basilar airspace disease left greater than right. No pneumothorax. IMPRESSION: Stable bilateral pleural effusions and bilateral airspace disease. Electronically Signed   By: Jolaine Click M.D.   On: 01/26/2017 07:20   Ct Image Guided Drainage By Percutaneous Catheter  Result Date: 01/27/2017 INDICATION: 41 year old male with a history of gastric bypass surgery and multiple intra-abdominal fluid collections and exited infusion of the left chest. EXAM: CT-GUIDED DRAIN OF RIGHT PERI HEPATIC/SUBCAPSULAR FLUID. CT-GUIDED LEFT CHEST TUBE PLACEMENT MEDICATIONS: The patient is currently admitted to the hospital and receiving intravenous antibiotics. The antibiotics were administered within an appropriate time frame prior to  the initiation of the procedure. ANESTHESIA/SEDATION: 3.0 mg IV Versed 200 mcg IV Fentanyl Moderate Sedation Time:  31 minutes The patient was continuously  monitored during the procedure by the interventional radiology nurse under my direct supervision. COMPLICATIONS: None TECHNIQUE: Informed written consent was obtained from the patient after a thorough discussion of the procedural risks, benefits and alternatives. All questions were addressed. Maximal Sterile Barrier Technique was utilized including caps, mask, sterile gowns, sterile gloves, sterile drape, hand hygiene and skin antiseptic. A timeout was performed prior to the initiation of the procedure. PROCEDURE: Patient is positioned supine position on the CT gantry table. Scout CT image of the upper abdomen and lower chest performed for planning purposes. The right upper abdomen was addressed. The right upper abdomen was prepped with chlorhexidine in a sterile fashion, and a sterile drape was applied covering the operative field. A sterile gown and sterile gloves were used for the procedure. Local anesthesia was provided with 1% Lidocaine. Once the patient is prepped and draped in the usual sterile fashion, the skin and subcutaneous tissues were generously infiltrated 1% lidocaine for local anesthesia. Using a trocar needle, the fluid at the right peripheral liver was approach. Once we confirmed the needle tip within the fluid, modified Seldinger technique was used to place a 12 [place] drain. Drain was sutured in position and attached to gravity drainage. The left chest was then addressed. The lower left chest was prepped with chlorhexidine in a sterile fashion common sterile drape was applied covering the operative field. Sterile gown and gloves were used. Local anesthesia provided with 1% lidocaine. Once the patient was prepped and draped in the usual sterile fashion, the skin and subcutaneous tissues were generously infiltrated 1% lidocaine for local anesthesia. Using CT guidance, trocar needle was advanced into the fluid of the left pleural space. Using modified Seldinger technique, a 10 French drain was  placed. This was attached to water seal chamber. Culture from the abdomen and the chest were sent to the lab. Final CT image was acquired. Patient tolerated the procedure well and remained hemodynamically stable throughout. No complications were encountered and no significant blood loss. FINDINGS: Initial CT image demonstrates perihepatic fluid collection of the right aspect of the liver, likely subcapsular given the configuration. Unchanged from prior. Drainage catheter terminates in the midline adjacent to the surgical changes of gastric bypass, similar prior. Small fluid adjacent to the spleen. Unchanged appearance of left pleural fluid. Status post drainage of right upper abdomen and left chest fluid, decreased size of the fluid collection of the right abdomen, with unchanged size of fluid collection in the chest. Paragraphs sample was sent for me age fluid location. IMPRESSION: Status post CT-guided 12 French drain placement of the right upper abdomen, and CT-guided left thoracostomy tube with 10 French drain placed. Sample was sent to the lab for culture from each location. Signed, Yvone Neu. Loreta Ave, DO [facility] Electronically Signed   By: Gilmer Mor D.O.   On: 01/27/2017 15:32   Ct Image Guided Drainage By Percutaneous Catheter  Result Date: 01/27/2017 INDICATION: 41 year old male with a history of gastric bypass surgery and multiple intra-abdominal fluid collections and exited infusion of the left chest. EXAM: CT-GUIDED DRAIN OF RIGHT PERI HEPATIC/SUBCAPSULAR FLUID. CT-GUIDED LEFT CHEST TUBE PLACEMENT MEDICATIONS: The patient is currently admitted to the hospital and receiving intravenous antibiotics. The antibiotics were administered within an appropriate time frame prior to the initiation of the procedure. ANESTHESIA/SEDATION: 3.0 mg IV Versed 200  mcg IV Fentanyl Moderate Sedation Time:  31 minutes The patient was continuously monitored  during the procedure by the interventional radiology nurse under my direct supervision. COMPLICATIONS: None TECHNIQUE: Informed written consent was obtained from the patient after a thorough discussion of the procedural risks, benefits and alternatives. All questions were addressed. Maximal Sterile Barrier Technique was utilized including caps, mask, sterile gowns, sterile gloves, sterile drape, hand hygiene and skin antiseptic. A timeout was performed prior to the initiation of the procedure. PROCEDURE: Patient is positioned supine position on the CT gantry table. Scout CT image of the upper abdomen and lower chest performed for planning purposes. The right upper abdomen was addressed. The right upper abdomen was prepped with chlorhexidine in a sterile fashion, and a sterile drape was applied covering the operative field. A sterile gown and sterile gloves were used for the procedure. Local anesthesia was provided with 1% Lidocaine. Once the patient is prepped and draped in the usual sterile fashion, the skin and subcutaneous tissues were generously infiltrated 1% lidocaine for local anesthesia. Using a trocar needle, the fluid at the right peripheral liver was approach. Once we confirmed the needle tip within the fluid, modified Seldinger technique was used to place a 12 [place] drain. Drain was sutured in position and attached to gravity drainage. The left chest was then addressed. The lower left chest was prepped with chlorhexidine in a sterile fashion common sterile drape was applied covering the operative field. Sterile gown and gloves were used. Local anesthesia provided with 1% lidocaine. Once the patient was prepped and draped in the usual sterile fashion, the skin and subcutaneous tissues were generously infiltrated 1% lidocaine for local anesthesia. Using CT guidance, trocar needle was advanced into the fluid of the left pleural space. Using modified Seldinger technique, a 10 French drain was placed. This  was attached to water seal chamber. Culture from the abdomen and the chest were sent to the lab. Final CT image was acquired. Patient tolerated the procedure well and remained hemodynamically stable throughout. No complications were encountered and no significant blood loss. FINDINGS: Initial CT image demonstrates perihepatic fluid collection of the right aspect of the liver, likely subcapsular given the configuration. Unchanged from prior. Drainage catheter terminates in the midline adjacent to the surgical changes of gastric bypass, similar prior. Small fluid adjacent to the spleen. Unchanged appearance of left pleural fluid. Status post drainage of right upper abdomen and left chest fluid, decreased size of the fluid collection of the right abdomen, with unchanged size of fluid collection in the chest. Paragraphs sample was sent for me age fluid location. IMPRESSION: Status post CT-guided 12 French drain placement of the right upper abdomen, and CT-guided left thoracostomy tube with 10 French drain placed. Sample was sent to the lab for culture from each location. Signed, Yvone NeuJaime S. Loreta AveWagner, DO [facility] Electronically Signed   By: Gilmer MorJaime  Wagner D.O.   On: 01/27/2017 15:32    Labs:  CBC:  Recent Labs  01/23/17 1248 01/25/17 0458 01/26/17 0433 01/27/17 0551  WBC 6.9 4.7 5.5 5.4  HGB 9.4* 8.3* 8.2* 8.2*  HCT 31.2* 27.9* 27.9* 27.3*  PLT 401* 418* 432* 444*    COAGS:  Recent Labs  01/15/17 1420 01/20/17 1744 01/23/17 0723  INR 1.07 1.19 1.12    BMP:  Recent Labs  01/24/17 0501 01/25/17 0458 01/26/17 0433 01/27/17 0551  NA 133* 135 133* 133*  K 3.8 4.0 4.0 4.0  CL 97* 100* 98*  98*  CO2 GLUCOSE 169* 160* 137* 158*  BUN CALCIUM 8.1* 8.2* 8.3* 8.1*  CREATININE 0.48* 0.46* 0.48* 0.48*  GFRNONAA >60 >60 >60 >60  GFRAA >60 >60 >60 >60    LIVER FUNCTION TESTS:  Recent Labs  01/19/17 0516  01/22/17 0553 01/23/17 1248 01/26/17 0433 01/27/17 1937  BILITOT 0.3 0.6 0.8 0.4  --   AST --   ALT 11* 18 19 15*  --   ALKPHOS 116 139* 175* 116  --   PROT 5.7* 5.7* 6.7 6.4*  --   ALBUMIN 1.6* 1.6* 1.9* 1.9* 1.7*    Assessment and Plan: Abdominal abscesses s/p RLQ drain placement by Dr. Deanne Coffer 01/16/17, peri-epigastric drain placement 9/5 by Dr. Grace Isaac, and hepatic abscess drain and chest tube placement by Dr. Loreta Ave 9/11 Patient with continued increased output from all of his drains.   Peri-epigastric and RLQ drain with blood-tinged fluid, Peri-hepatic drain with purulent output, and chest tube with serous fluid. Cultures pending.  IR to follow.   Electronically Signed: Hoyt Koch, PA 01/28/2017, 2:27 PM   I spent a total of 15 Minutes at the the patient's bedside AND on the patient's hospital floor or unit, greater than 50% of which was counseling/coordinating care for abdominal abscesses

## 2017-01-28 NOTE — Progress Notes (Signed)
PHARMACY - ADULT TOTAL PARENTERAL NUTRITION CONSULT NOTE   Pharmacy Consult:  TPN Indication:  Prolonged NPO  Patient Measurements: Height: 5' 10" (177.8 cm) Weight: (!) 340 lb 12.8 oz (154.6 kg) IBW/kg (Calculated) : 73 TPN AdjBW (KG): 93.4 Body mass index is 48.9 kg/m.  Assessment:  41 year old obese male s/p laparoscopic Roux-en-Y/gastric bypass surgery on 12/08/16 and hospitalization 8/14 through 01/10/17 for anasarca, abdominal pain, fever, and postoperative seroma with percutaneous drain placement now re-admitted with continued abdominal pain, occasional nausea, non-bloddy emesis, weight loss (on Lasix), and decreased appetite. CT revealed multiple large gas containing intra-abdominal abscesses concerning for dehiscence. Patient had new pleural drain and abdominal drain placed on 9/11.  GI: anasarca/vol overload with perc drain placed 8/31, 9/5, 9/11. 1L out from drains yesterday.  Prealbumin increasing at 11.1, LBM 9/8.  Pepcid in TPN, PRN Zofran, Anusol-HC for hemorrhoids Pt was tolerating premier protein and liquid diet - but this has been held as pt is planning to return to surgery Endo: DM2 on metformin PTA - CBGs < 180 Insulin requirements in past 24 hrs: 15 units rSSI + 25 units in TPN Lytes: Low Na/Cl. Phos 4.4, CoCa  9.7 (CaxPhos <55). Mg still 1.7  *days of TPN without lytes: 9/7, 9/10 Renal: SCr 0.48 stable, BUN WNL - UOP 0.9 ml/kg/hr, NS 35 ml/hr Pulm: effusion - s/p thoracentesis 9/7, plan for catheter drainage +/- thrombolytics. On 2L [place] (CPAP at home), Cards: HLD - VSS Hepatobil:  LFTs/ tbili / TG WNL.  Alk Phos trended down to wnl.  Neuro: pain score 5-9, Robaxin Q8H, PRN Oxycodone/Dilaudid.  Patient appears comfortable. ID: Ertapenem/flagyl for C.freundii peritonitis/intra-abd abscess.  Rare gardnerella/Strep constellatus in abdominal abscess cx.  Afebrile, WBC WNL  Zosyn 8/30 >> 9/7 Vanc 9/6 >> 9/7 Cefepime 9/7 >> 9/11 Flagyl 9/7 >> 9 Ertapenem 9/11 >>  8/30  MRSA PCR - negative 8/30 BCx - negative 8/31 abd abscess cx - Gardnerella vaginalis, Strep constellatus 9/5 abscess cx - C.freundii (S cefepime, Cipro, Primaxin, Septra) - holding for anaerobes 9/7 pleural fluid cx - MGTD  Best Practices: Lovenox 40 BID TPN Access: double lumen PICC placed 01/16/17 TPN start date: 01/16/17  Nutritional Goals (per RD recommendation 8/31): 2300-2500 kCal and 115-130 gm protein per day   Current Nutrition:  Clinimix E 5/15 at 100 ml/hr + ILE at 20 ml/hr x 12 hrs = 2184 kCal and 120 g protein per day CLD    Plan:  - Continue Clinimix 5/15 at 100 ml/hr + 20% ILE at 20 ml/hr x 12 hrs   - TPN + ILE will provide 2184 kCal and 120 g of protein per day, meeting 95 % of minimal kCal needs and 100% of protein goal  - Patient had part of one premier protein drink yesterday, this amounted to < 40 kcal and < 8 g of protein - Daily multivitamin and trace elements in TPN - Continue resistant SSI Q4H  - Increase insulin in TPN to 30 units  - Pepcid 40 mg daily in TPN  - F/u diet advancement - TPN is at higher rate only temporarily while pt is not taking premier protein nor liquid diet     , PharmD, BCPS Clinical Pharmacist 01/28/2017 10:15 AM  

## 2017-01-28 NOTE — Progress Notes (Signed)
Nutrition Follow-up  DOCUMENTATION CODES:   Morbid obesity  INTERVENTION:   -TPN management per pharmacy -Continue Premier Protein QID  NUTRITION DIAGNOSIS:   Inadequate oral intake related to  (peritonitis ) as evidenced by  (clear liquid diet, TPN dependent).  Progressing  GOAL:   Patient will meet greater than or equal to 90% of their needs  Met with TPN  MONITOR:   PO intake, Supplement acceptance, Diet advancement, Labs, Weight trends, Skin, I & O's  REASON FOR ASSESSMENT:   Consult New TPN/TNA  ASSESSMENT:   41 yo Male with PMH of type II DM, HLD, OSA on nightly CPAP; admitted with sepsis secondary to postop intra-abdominal abscesses and peritonitis.  Pt receiving nursing care at time of visit. Spoke with mobility tech, who reports that pt is preparing to ambulate hallways.   9/11- s/p CT guided drainage of perihepatic/subcapsular fluid with 73F drain to gravity and CT guided drainage of left pleural fluid with 32F drain (1080 ml output from drains within the past 24 hours).  Pt advanced back to bariatric clear liquid diet on 01/27/17 (previously on bariatric clear liquid diet from 9/7-9/9). Pt is tolerating intake well, but intake minimal (PO 0-10%). Pt also taking sips of Premier Protein supplement ordered by surgical service.   Pt remains on TPN; currently receiving Clinimix 5/15 at 100 ml/hr and 20% ILE at 20 ml/hr x 12 hrs . TPN + ILE will provide 2184 kCal and 120 g of protein per day, meeting 95 % of minimal kCal needs and 100% of protein goal.   Labs reviewed: CBGS: 122-142. Current orders for glycemic control are 0-20 units insulin aspart every 4 hours.   Diet Order:  .TPN (CLINIMIX-E) Adult Diet bariatric clear liquid Room service appropriate? Yes; Fluid consistency: Thin .TPN (CLINIMIX-E) Adult  Skin:  Wound (see comment) (MASD to scrotum)  Last BM:  01/26/17  Height:   Ht Readings from Last 1 Encounters:  01/15/17 '5\' 10"'  (1.778 m)    Weight:    Wt Readings from Last 1 Encounters:  01/26/17 (!) 340 lb 12.8 oz (154.6 kg)    Ideal Body Weight:  75.4 kg  BMI:  Body mass index is 48.9 kg/m.  Estimated Nutritional Needs:   Kcal:  8841-6606  Protein:  115-130 gm  Fluid:  2.3-2.5 L  EDUCATION NEEDS:   No education needs identified at this time  Nicolaas Savo A. Jimmye Norman, RD, LDN, CDE Pager: [phone] After hours Pager: [phone]

## 2017-01-28 NOTE — Progress Notes (Signed)
PROGRESS NOTE    Albert PetrinSteven T Gaspar  ZOX:[id number] DOB: Jul 07, 1975 DOA: 01/15/2017 PCP: Alysia PennaHolwerda, Scott, MD   Brief Narrative: 41 year old male with history of type 2 diabetes, dyslipidemia, obstructive sleep apnea, Roux-en-Y/gastric bypass on 12/08/16 in [place] by Dr. Franki Cabothomas Walsh, rehospitalized 12/30/16-01/10/17 for anasarca, abdominal pain, fever of 103F and postoperative seroma for which a percutaneous drain was placed. After discharge from hospital patient continued to have abdominal pain, associated with nausea, emesis and decreased appetite, outpatient CT of the abdomen showedgas containing 10.7 x 5 cm abscess abutting the posterior margin of the GJ anastomosis worrisome for dehiscence, diffuse peritonitis with large gas containing abscess throughout the peritoneal cavity including a right perihepatic abscess compressing the right liver lobe and provoking anileus, findings that prompted rehospitalization.   Patient was admitted to the hospital with the working diagnosis of abdominal sepsis due to peritonitis, and intra-abdominal abscess related to a postoperative complication of Roux-en-Y/gastric bypass (12/08/16).  Assessment & Plan:  # Sepsis due to peritonitis and multiple intra-abdominal abscess, colitis - s/p RLQ drain placement on 01/16/17, peri-epigastric drain placement 9/5  and hepatic abscess drain and chest tube placement by 9/11. IR consult appreciated. -Currently on Invanz, Flagyl as per infectious disease -Follow up culture results. -Currently on TPN.  #Pleural effusion: CT scan with a moderate left pleural effusion with loculation status post chest tube placement. CT surgery following. -Pulmonary is following for possible Lytic therapy.  #Type 2 diabetes: Monitor blood sugar level. Continue sliding scale.  #Obstructive sleep apnea: Continue CPAP. Continue incentive spirometer  #Anemia multifactorial etiology, likely due to chronic disease: Monitor CBC. No sign of  bleeding  #History of morbid obesity  Patient with multiple surgical issues and accommodations. Managed by multiple specialists including infectious disease, general surgery, interventional radiology, pulmonologist, cardiothoracic surgeon, alll appreciated.  Active Problems:   Postprocedural intraabdominal abscess   Pleural effusion on left   Peritonitis (HCC)   Gastrointestinal anastomotic leak  DVT prophylaxis: Lovenox subcutaneous Code Status: Full code Family Communication: Discussed with the patient's wife at bedside Disposition Plan: Currently admitted    Consultants:   General surgery  IR  Infectious disease  Pulmonologist  Cardiothoracic surgery  Pharmacist  Procedures: Multiple drain placement Antimicrobials: Currently on Invanz and Flagyl  Subjective: Seen and examined at bedside. Reported. Is controlled with current medication. Denied headache, dizziness, nausea vomiting chest pain shortness of breath. Wife at bedside.  Objective: Vitals:   01/27/17 1415 01/27/17 1440 01/27/17 2013 01/28/17 0445  BP: (!) 142/96 (!) 147/82 134/77 (!) 143/87  Pulse: (!) 104 (!) 107 (!) 107 94  Resp: 18 18 18 18   Temp:  (!) 97.5 F (36.4 C) 98 F (36.7 C) 98.1 F (36.7 C)  TempSrc:  Oral Oral Oral  SpO2: 98% 96% 96% 94%  Weight:      Height:        Intake/Output Summary (Last 24 hours) at 01/28/17 1505 Last data filed at 01/28/17 0918  Gross per 24 hour  Intake             2953 ml  Output             3180 ml  Net             -227 ml   Filed Weights   01/24/17 0418 01/25/17 0416 01/26/17 0409  Weight: (!) 151.4 kg (333 lb 12.8 oz) (!) 155.4 kg (342 lb 11.2 oz) (!) 154.6 kg (340 lb 12.8 oz)    Examination:  General exam:  Appears calm and comfortable  Respiratory system: Clear bilaterally, respiratory effort normal, has chest drain Cardiovascular system: S1 & S2 heard, RRR.  No pedal edema. Gastrointestinal system: Abdomen soft with multiple drains,  distention, diffuse tenderness. Central nervous system: Alert awake and following commands. Skin: No rashes, lesions or ulcers Psychiatry: Judgement and insight appear normal. Mood & affect appropriate.     Data Reviewed: I have personally reviewed following labs and imaging studies  CBC:  Recent Labs Lab 01/22/17 0553 01/23/17 1248 01/25/17 0458 01/26/17 0433 01/27/17 0551  WBC 5.3 6.9 4.7 5.5 5.4  NEUTROABS  --   --   --  3.5  --   HGB 8.9* 9.4* 8.3* 8.2* 8.2*  HCT 29.3* 31.2* 27.9* 27.9* 27.3*  MCV 85.4 85.2 85.6 85.6 85.3  PLT 352 401* 418* 432* 444*   Basic Metabolic Panel:  Recent Labs Lab 01/22/17 0553 01/23/17 1248 01/24/17 0501 01/25/17 0458 01/26/17 0433 01/27/17 0551  NA 134* 135 133* 135 133* 133*  K 4.1 4.2 3.8 4.0 4.0 4.0  CL 98* 97* 97* 100* 98* 98*  CO2 GLUCOSE 161* 199* 169* 160* 137* 158*  BUN 5* CREATININE 0.53* 0.60* 0.48* 0.46* 0.48* 0.48*  CALCIUM 8.1* 8.6* 8.1* 8.2* 8.3* 8.1*  MG 1.7  --  1.7  --  1.7 1.7  PHOS 5.3*  --  3.9  --  4.7* 4.4   GFR: Estimated Creatinine Clearance: 183.3 mL/min (A) (by C-G formula based on SCr of 0.48 mg/dL (L)). Liver Function Tests:  Recent Labs Lab 01/22/17 0553 01/23/17 1248 01/26/17 0433 01/27/17 1937  AST --   ALT 18 19 15*  --   ALKPHOS 139* 175* 116  --   BILITOT 0.6 0.8 0.4  --   PROT 5.7* 6.7 6.4*  --   ALBUMIN 1.6* 1.9* 1.9* 1.7*   No results for input(s): LIPASE, AMYLASE in the last 168 hours. No results for input(s): AMMONIA in the last 168 hours. Coagulation Profile:  Recent Labs Lab 01/23/17 0723  INR 1.12   Cardiac Enzymes: No results for input(s): CKTOTAL, CKMB, CKMBINDEX, TROPONINI in the last 168 hours. BNP (last 3 results) No results for input(s): PROBNP in the last 8760 hours. HbA1C: No results for input(s): HGBA1C in the last 72 hours. CBG:  Recent Labs Lab 01/27/17 2004 01/28/17 0116 01/28/17 0404 01/28/17 0738  01/28/17 1213  GLUCAP 142* 137* 122* 142* 129*   Lipid Profile:  Recent Labs  01/26/17 0433  TRIG 116   Thyroid Function Tests: No results for input(s): TSH, T4TOTAL, FREET4, T3FREE, THYROIDAB in the last 72 hours. Anemia Panel: No results for input(s): VITAMINB12, FOLATE, FERRITIN, TIBC, IRON, RETICCTPCT in the last 72 hours. Sepsis Labs: No results for input(s): PROCALCITON, LATICACIDVEN in the last 168 hours.  Recent Results (from the past 240 hour(s))  Aerobic/Anaerobic Culture (surgical/deep wound)     Status: None   Collection Time: 01/21/17 10:04 AM  Result Value Ref Range Status   Specimen Description ABSCESS  Final   Special Requests DRAIN POST CT GUIDED PLACEMENT  Final   Gram Stain   Final    ABUNDANT WBC PRESENT,BOTH PMN AND MONONUCLEAR ABUNDANT GRAM POSITIVE COCCI IN PAIRS ABUNDANT GRAM VARIABLE ROD    Culture   Final    ABUNDANT CITROBACTER FREUNDII FEW VIRIDANS STREPTOCOCCUS CALL MICROBIOLOGY LAB IF SENSITIVITIES ARE REQUIRED. ABUNDANT PREVOTELLA DENTICOLA BETA LACTAMASE POSITIVE  Report Status 01/25/2017 FINAL  Final   Organism ID, Bacteria CITROBACTER FREUNDII  Final      Susceptibility   Citrobacter freundii - MIC*    CEFAZOLIN >=64 RESISTANT Resistant     CEFEPIME <=1 SENSITIVE Sensitive     CEFTAZIDIME >=64 RESISTANT Resistant     CEFTRIAXONE 16 INTERMEDIATE Intermediate     CIPROFLOXACIN <=0.25 SENSITIVE Sensitive     GENTAMICIN <=1 SENSITIVE Sensitive     IMIPENEM <=0.25 SENSITIVE Sensitive     TRIMETH/SULFA <=20 SENSITIVE Sensitive     PIP/TAZO 64 INTERMEDIATE Intermediate     * ABUNDANT CITROBACTER FREUNDII  Gram stain     Status: None   Collection Time: 01/23/17 12:53 PM  Result Value Ref Range Status   Specimen Description PLEURAL LEFT  Final   Special Requests NONE  Final   Gram Stain NO WBC SEEN NO ORGANISMS SEEN   Final   Report Status 01/23/2017 FINAL  Final  Culture, body fluid-bottle     Status: None   Collection Time:  01/23/17 12:53 PM  Result Value Ref Range Status   Specimen Description PLEURAL LEFT  Final   Special Requests NONE  Final   Culture NO GROWTH 5 DAYS  Final   Report Status 01/28/2017 FINAL  Final  Aerobic/Anaerobic Culture (surgical/deep wound)     Status: None (Preliminary result)   Collection Time: 01/27/17  1:58 PM  Result Value Ref Range Status   Specimen Description ABSCESS PLEURAL LEFT  Final   Special Requests SYRINGE  Final   Gram Stain   Final    RARE WBC PRESENT,BOTH PMN AND MONONUCLEAR NO ORGANISMS SEEN    Culture NO GROWTH < 24 HOURS  Final   Report Status PENDING  Incomplete  Aerobic/Anaerobic Culture (surgical/deep wound)     Status: None (Preliminary result)   Collection Time: 01/27/17  2:34 PM  Result Value Ref Range Status   Specimen Description ABDOMEN  Final   Special Requests RIGHT PERIHEPATIC FLUID  Final   Gram Stain   Final    MODERATE WBC PRESENT,BOTH PMN AND MONONUCLEAR NO ORGANISMS SEEN    Culture NO GROWTH < 24 HOURS  Final   Report Status PENDING  Incomplete         Radiology Studies: Dg Chest Port 1 View  Result Date: 01/28/2017 CLINICAL DATA:  Follow-up bilateral pleural effusions with chest tube treatment. The patient underwent recent bariatric surgery. EXAM: PORTABLE CHEST 1 VIEW COMPARISON:  Portable chest x-ray of January 27, 2017 FINDINGS: The lungs are well-expanded. Pleural fluid layer sposteriorly and laterally, bilaterally. The small caliber pigtail pleural drainage catheters are in stable position at the lung bases. The hemidiaphragms remain obscured. The cardiac silhouette is mildly enlarged. The central pulmonary vascularity is prominent. The interstitial markings remain increased bilaterally The PICC line tip projects over the proximal SVC. The bony thorax exhibits no acute abnormality. IMPRESSION: Stable appearance of the chest since yesterday's study. Persistent bilateral pleural effusions layering inferiorly and posteriorly. Mild  cardiomegaly and central pulmonary vascular congestion with mild pulmonary interstitial edema. Electronically Signed   By: David  [place] M.D.   On: 01/28/2017 07:36   Dg Chest Port 1 View  Result Date: 01/27/2017 CLINICAL DATA:  Pleural effusions EXAM: PORTABLE CHEST 1 VIEW COMPARISON:  01/26/2017 FINDINGS: Pigtail catheters project over each lung base with hazy adjacent opacities presumably due to layering effusions and adjacent atelectasis. No pneumothorax is identified. Right-sided PICC line tip is seen in the mid SVC. Heart is borderline  enlarged with mild vascular congestion. No acute nor suspicious osseous abnormalities. IMPRESSION: 1. New pigtail catheters are seen at each lung base superimposed on presumed layering pleural effusions and atelectasis. 2. No pneumothorax. 3. Stable cardiomegaly. Electronically Signed   By: Tollie Eth M.D.   On: 01/27/2017 19:54   Ct Image Guided Drainage By Percutaneous Catheter  Result Date: 01/27/2017 INDICATION: 41 year old male with a history of gastric bypass surgery and multiple intra-abdominal fluid collections and exited infusion of the left chest. EXAM: CT-GUIDED DRAIN OF RIGHT PERI HEPATIC/SUBCAPSULAR FLUID. CT-GUIDED LEFT CHEST TUBE PLACEMENT MEDICATIONS: The patient is currently admitted to the hospital and receiving intravenous antibiotics. The antibiotics were administered within an appropriate time frame prior to the initiation of the procedure. ANESTHESIA/SEDATION: 3.0 mg IV Versed 200 mcg IV Fentanyl Moderate Sedation Time:  31 minutes The patient was continuously monitored during the procedure by the interventional radiology nurse under my direct supervision. COMPLICATIONS: None TECHNIQUE: Informed written consent was obtained from the patient after a thorough discussion of the procedural risks, benefits and alternatives. All questions were addressed. Maximal Sterile Barrier Technique was utilized including caps, mask, sterile gowns, sterile gloves,  sterile drape, hand hygiene and skin antiseptic. A timeout was performed prior to the initiation of the procedure. PROCEDURE: Patient is positioned supine position on the CT gantry table. Scout CT image of the upper abdomen and lower chest performed for planning purposes. The right upper abdomen was addressed. The right upper abdomen was prepped with chlorhexidine in a sterile fashion, and a sterile drape was applied covering the operative field. A sterile gown and sterile gloves were used for the procedure. Local anesthesia was provided with 1% Lidocaine. Once the patient is prepped and draped in the usual sterile fashion, the skin and subcutaneous tissues were generously infiltrated 1% lidocaine for local anesthesia. Using a trocar needle, the fluid at the right peripheral liver was approach. Once we confirmed the needle tip within the fluid, modified Seldinger technique was used to place a 12 [place] drain. Drain was sutured in position and attached to gravity drainage. The left chest was then addressed. The lower left chest was prepped with chlorhexidine in a sterile fashion common sterile drape was applied covering the operative field. Sterile gown and gloves were used. Local anesthesia provided with 1% lidocaine. Once the patient was prepped and draped in the usual sterile fashion, the skin and subcutaneous tissues were generously infiltrated 1% lidocaine for local anesthesia. Using CT guidance, trocar needle was advanced into the fluid of the left pleural space. Using modified Seldinger technique, a 10 French drain was placed. This was attached to water seal chamber. Culture from the abdomen and the chest were sent to the lab. Final CT image was acquired. Patient tolerated the procedure well and remained hemodynamically stable throughout. No complications were encountered and no significant blood loss. FINDINGS: Initial CT image demonstrates perihepatic fluid collection of the right aspect of the liver, likely  subcapsular given the configuration. Unchanged from prior. Drainage catheter terminates in the midline adjacent to the surgical changes of gastric bypass, similar prior. Small fluid adjacent to the spleen. Unchanged appearance of left pleural fluid. Status post drainage of right upper abdomen and left chest fluid, decreased size of the fluid collection of the right abdomen, with unchanged size of fluid collection in the chest. Paragraphs sample was sent for me age fluid location. IMPRESSION: Status post CT-guided 12 French drain placement of the right upper abdomen, and CT-guided left thoracostomy tube with 10 [place]  drain placed. Sample was sent to the lab for culture from each location. Signed, Yvone Neu. Loreta Ave, DO [facility] Electronically Signed   By: Gilmer Mor D.O.   On: 01/27/2017 15:32   Ct Image Guided Drainage By Percutaneous Catheter  Result Date: 01/27/2017 INDICATION: 41 year old male with a history of gastric bypass surgery and multiple intra-abdominal fluid collections and exited infusion of the left chest. EXAM: CT-GUIDED DRAIN OF RIGHT PERI HEPATIC/SUBCAPSULAR FLUID. CT-GUIDED LEFT CHEST TUBE PLACEMENT MEDICATIONS: The patient is currently admitted to the hospital and receiving intravenous antibiotics. The antibiotics were administered within an appropriate time frame prior to the initiation of the procedure. ANESTHESIA/SEDATION: 3.0 mg IV Versed 200 mcg IV Fentanyl Moderate Sedation Time:  31 minutes The patient was continuously monitored during the procedure by the interventional radiology nurse under my direct supervision. COMPLICATIONS: None TECHNIQUE: Informed written consent was obtained from the patient after a thorough discussion of the procedural risks, benefits and alternatives. All questions were addressed. Maximal Sterile Barrier Technique was utilized including caps, mask, sterile gowns, sterile gloves, sterile drape,  hand hygiene and skin antiseptic. A timeout was performed prior to the initiation of the procedure. PROCEDURE: Patient is positioned supine position on the CT gantry table. Scout CT image of the upper abdomen and lower chest performed for planning purposes. The right upper abdomen was addressed. The right upper abdomen was prepped with chlorhexidine in a sterile fashion, and a sterile drape was applied covering the operative field. A sterile gown and sterile gloves were used for the procedure. Local anesthesia was provided with 1% Lidocaine. Once the patient is prepped and draped in the usual sterile fashion, the skin and subcutaneous tissues were generously infiltrated 1% lidocaine for local anesthesia. Using a trocar needle, the fluid at the right peripheral liver was approach. Once we confirmed the needle tip within the fluid, modified Seldinger technique was used to place a 12 [place] drain. Drain was sutured in position and attached to gravity drainage. The left chest was then addressed. The lower left chest was prepped with chlorhexidine in a sterile fashion common sterile drape was applied covering the operative field. Sterile gown and gloves were used. Local anesthesia provided with 1% lidocaine. Once the patient was prepped and draped in the usual sterile fashion, the skin and subcutaneous tissues were generously infiltrated 1% lidocaine for local anesthesia. Using CT guidance, trocar needle was advanced into the fluid of the left pleural space. Using modified Seldinger technique, a 10 French drain was placed. This was attached to water seal chamber. Culture from the abdomen and the chest were sent to the lab. Final CT image was acquired. Patient tolerated the procedure well and remained hemodynamically stable throughout. No complications were encountered and no significant blood loss. FINDINGS: Initial CT image demonstrates perihepatic fluid collection of the right aspect of the liver, likely subcapsular  given the configuration. Unchanged from prior. Drainage catheter terminates in the midline adjacent to the surgical changes of gastric bypass, similar prior. Small fluid adjacent to the spleen. Unchanged appearance of left pleural fluid. Status post drainage of right upper abdomen and left chest fluid, decreased size of the fluid collection of the right abdomen, with unchanged size of fluid collection in the chest. Paragraphs sample was sent for me age fluid location. IMPRESSION: Status post CT-guided 12 French drain placement of the right upper abdomen, and CT-guided left thoracostomy tube with 10 French drain placed. Sample was sent to the lab for culture from  each location. Signed, Yvone Neu. Loreta Ave, DO [facility] Radiology Electronically Signed   By: Gilmer Mor D.O.   On: 01/27/2017 15:32        Scheduled Meds: . acetaminophen  1,000 mg Oral Q8H  . chlorhexidine  15 mL Mouth Rinse BID  . Chlorhexidine Gluconate Cloth  6 each Topical Daily  . enoxaparin (LOVENOX) injection  40 mg Subcutaneous Q12H  . hydrocortisone   Rectal BID  . insulin aspart  0-20 Units Subcutaneous Q4H  . mouth rinse  15 mL Mouth Rinse q12n4p  . pneumococcal 23 valent vaccine  0.5 mL Intramuscular Tomorrow-1000  . protein supplement shake  2 oz Oral QID  . sodium chloride flush  5 mL Intravenous Q8H  . sodium chloride flush  5 mL Intravenous Q8H  . sodium chloride flush  5 mL Intravenous Q8H   Continuous Infusions: . Marland KitchenTPN (CLINIMIX-E) Adult 100 mL/hr at 01/27/17 1746  . Marland KitchenTPN (CLINIMIX-E) Adult     And  . fat emulsion    . sodium chloride 35 mL/hr at 01/25/17 2046  . ertapenem 1 g (01/27/17 2038)  . methocarbamol (ROBAXIN)  IV Stopped (01/28/17 1422)  . metronidazole 500 mg (01/28/17 1437)     LOS: 13 days    Umair Rosiles Jaynie Collins, MD [facility] Pager [phone]  If 7PM-7AM, please contact night-coverage [email or web address] Password [facility] 01/28/2017,  3:05 PM

## 2017-01-29 ENCOUNTER — Other Ambulatory Visit (HOSPITAL_COMMUNITY): Payer: 59

## 2017-01-29 ENCOUNTER — Inpatient Hospital Stay (HOSPITAL_COMMUNITY): Payer: 59

## 2017-01-29 LAB — COMPREHENSIVE METABOLIC PANEL
ALBUMIN: 1.8 g/dL — AB (ref 3.5–5.0)
ALK PHOS: 83 U/L (ref 38–126)
ALT: 10 U/L — ABNORMAL LOW (ref 17–63)
AST: 17 U/L (ref 15–41)
Anion gap: 7 (ref 5–15)
BUN: 9 mg/dL (ref 6–20)
CALCIUM: 8.2 mg/dL — AB (ref 8.9–10.3)
CHLORIDE: 98 mmol/L — AB (ref 101–111)
CO2: 28 mmol/L (ref 22–32)
CREATININE: 0.46 mg/dL — AB (ref 0.61–1.24)
GFR calc Af Amer: >60 mL/min (ref >60)
GFR calc non Af Amer: >60 mL/min (ref >60)
GLUCOSE: 131 mg/dL — AB (ref 65–99)
Potassium: 4 mmol/L (ref 3.5–5.1)
SODIUM: 133 mmol/L — AB (ref 135–145)
Total Bilirubin: 0.2 mg/dL — ABNORMAL LOW (ref 0.3–1.2)
Total Protein: 6 g/dL — ABNORMAL LOW (ref 6.5–8.1)

## 2017-01-29 LAB — GLUCOSE, CAPILLARY
GLUCOSE-CAPILLARY: 123 mg/dL — AB (ref 65–99)
GLUCOSE-CAPILLARY: 131 mg/dL — AB (ref 65–99)
GLUCOSE-CAPILLARY: 99 mg/dL (ref 65–99)
Glucose-Capillary: 132 mg/dL — ABNORMAL HIGH (ref 65–99)
Glucose-Capillary: 147 mg/dL — ABNORMAL HIGH (ref 65–99)

## 2017-01-29 LAB — PH, BODY FLUID: pH, Body Fluid: 8

## 2017-01-29 LAB — AMYLASE, PLEURAL OR PERITONEAL FLUID: AMYLASE FL: 22 U/L

## 2017-01-29 LAB — PATHOLOGIST SMEAR REVIEW

## 2017-01-29 LAB — MAGNESIUM: Magnesium: 1.5 mg/dL — ABNORMAL LOW (ref 1.7–2.4)

## 2017-01-29 LAB — PHOSPHORUS: Phosphorus: 4.5 mg/dL (ref 2.5–4.6)

## 2017-01-29 MED ORDER — CLINIMIX E/DEXTROSE (5/15) 5 % IV SOLN
INTRAVENOUS | Status: DC
  Administered 2017-01-29: 18:00:00 via INTRAVENOUS
  Filled 2017-01-29: qty 2160

## 2017-01-29 MED ORDER — OXYCODONE HCL 5 MG PO TABS
15.0000 mg | ORAL_TABLET | ORAL | Status: DC | PRN
  Administered 2017-01-29 – 2017-02-01 (×17): 15 mg via ORAL
  Filled 2017-01-29 (×17): qty 3

## 2017-01-29 MED ORDER — MAGNESIUM SULFATE 2 GM/50ML IV SOLN
2.0000 g | Freq: Once | INTRAVENOUS | Status: AC
  Administered 2017-01-29: 2 g via INTRAVENOUS
  Filled 2017-01-29: qty 50

## 2017-01-29 MED ORDER — HYDROMORPHONE HCL 1 MG/ML IJ SOLN
1.0000 mg | INTRAMUSCULAR | Status: DC | PRN
  Administered 2017-01-29 – 2017-01-30 (×8): 1 mg via INTRAVENOUS
  Filled 2017-01-29 (×8): qty 1

## 2017-01-29 MED ORDER — FAT EMULSION 20 % IV EMUL
240.0000 mL | INTRAVENOUS | Status: DC
  Administered 2017-01-29: 240 mL via INTRAVENOUS
  Filled 2017-01-29: qty 250

## 2017-01-29 MED ORDER — POLYETHYLENE GLYCOL 3350 17 G PO PACK
17.0000 g | PACK | Freq: Every day | ORAL | Status: DC
  Administered 2017-01-30 – 2017-02-02 (×3): 17 g via ORAL
  Filled 2017-01-29 (×5): qty 1

## 2017-01-29 MED ORDER — SODIUM CHLORIDE 0.9 % IJ SOLN
10.0000 mg | Freq: Two times a day (BID) | INTRAMUSCULAR | Status: DC
  Administered 2017-01-29 – 2017-01-30 (×2): 10 mg via INTRAPLEURAL
  Filled 2017-01-29 (×3): qty 10

## 2017-01-29 MED ORDER — STERILE WATER FOR INJECTION IJ SOLN
5.0000 mg | Freq: Two times a day (BID) | RESPIRATORY_TRACT | Status: DC
  Administered 2017-01-29 – 2017-01-30 (×2): 5 mg via INTRAPLEURAL
  Filled 2017-01-29 (×3): qty 5

## 2017-01-29 NOTE — Progress Notes (Signed)
INFECTIOUS DISEASE PROGRESS NOTE  ID: Albert White is a 41 y.o. male with  Active Problems:   Postprocedural intraabdominal abscess   Pleural effusion on left   Peritonitis (HCC)   Gastrointestinal anastomotic leak   Acute pulmonary edema (HCC)  Subjective: C/o abd pain.   Abtx:  Anti-infectives    Start     Dose/Rate Route Frequency Ordered Stop   01/27/17 1900  ertapenem (INVANZ) 1 g in sodium chloride 0.9 % 50 mL IVPB     1 g 100 mL/hr over 30 Minutes Intravenous Every 24 hours 01/27/17 1836     01/23/17 1500  metroNIDAZOLE (FLAGYL) IVPB 500 mg     500 mg 100 mL/hr over 60 Minutes Intravenous Every 8 hours 01/23/17 1353     01/23/17 1500  ceFEPIme (MAXIPIME) 2 g in dextrose 5 % 50 mL IVPB  Status:  Discontinued     2 g 100 mL/hr over 30 Minutes Intravenous Every 8 hours 01/23/17 1357 01/27/17 1835   01/22/17 2000  vancomycin (VANCOCIN) 1,250 mg in sodium chloride 0.9 % 250 mL IVPB  Status:  Discontinued     1,250 mg 166.7 mL/hr over 90 Minutes Intravenous Every 8 hours 01/22/17 1202 01/23/17 1358   01/22/17 1230  vancomycin (VANCOCIN) 2,500 mg in sodium chloride 0.9 % 500 mL IVPB     2,500 mg 250 mL/hr over 120 Minutes Intravenous  Once 01/22/17 1147 01/22/17 1438   01/15/17 1815  piperacillin-tazobactam (ZOSYN) IVPB 3.375 g  Status:  Discontinued     3.375 g 12.5 mL/hr over 240 Minutes Intravenous Every 8 hours 01/15/17 1804 01/15/17 1804   01/15/17 1630  piperacillin-tazobactam (ZOSYN) IVPB 3.375 g  Status:  Discontinued     3.375 g 12.5 mL/hr over 240 Minutes Intravenous Every 8 hours 01/15/17 1616 01/23/17 1354      Medications:  Scheduled: . acetaminophen  1,000 mg Oral Q8H  . chlorhexidine  15 mL Mouth Rinse BID  . Chlorhexidine Gluconate Cloth  6 each Topical Daily  . enoxaparin (LOVENOX) injection  40 mg Subcutaneous Q12H  . hydrocortisone   Rectal BID  . insulin aspart  0-20 Units Subcutaneous Q4H  . mouth rinse  15 mL Mouth Rinse q12n4p  .  pneumococcal 23 valent vaccine  0.5 mL Intramuscular Tomorrow-1000  . polyethylene glycol  17 g Oral Daily  . protein supplement shake  2 oz Oral QID  . sodium chloride flush  5 mL Intravenous Q8H  . sodium chloride flush  5 mL Intravenous Q8H  . sodium chloride flush  5 mL Intravenous Q8H    Objective: Vital signs in last 24 hours: Temp:  [97.6 F (36.4 C)-98.1 F (36.7 C)] 97.6 F (36.4 C) (09/13 0411) Pulse Rate:  [102] 102 (09/13 0411) Resp:  [18] 18 (09/13 0411) BP: (134-149)/(81-91) 140/91 (09/13 0411) SpO2:  [96 %-98 %] 96 % (09/13 0411)   General appearance: alert, cooperative and no distress Resp: clear to auscultation bilaterally Cardio: regular rate and rhythm GI: normal findings: bowel sounds normal and soft, non-tender  Lab Results  Recent Labs  01/27/17 0551 01/29/17 0405  WBC 5.4  --   HGB 8.2*  --   HCT 27.3*  --   NA 133* 133*  K 4.0 4.0  CL 98* 98*  CO2 28 28  BUN 8 9  CREATININE 0.48* 0.46*   Liver Panel  Recent Labs  01/27/17 1937 01/29/17 0405  PROT  --  6.0*  ALBUMIN 1.7* 1.8*  AST  --  17  ALT  --  10*  ALKPHOS  --  83  BILITOT  --  0.2*   Sedimentation Rate No results for input(s): ESRSEDRATE in the last 72 hours. C-Reactive Protein No results for input(s): CRP in the last 72 hours.  Microbiology: Recent Results (from the past 240 hour(s))  Aerobic/Anaerobic Culture (surgical/deep wound)     Status: None   Collection Time: 01/21/17 10:04 AM  Result Value Ref Range Status   Specimen Description ABSCESS  Final   Special Requests DRAIN POST CT GUIDED PLACEMENT  Final   Gram Stain   Final    ABUNDANT WBC PRESENT,BOTH PMN AND MONONUCLEAR ABUNDANT GRAM POSITIVE COCCI IN PAIRS ABUNDANT GRAM VARIABLE ROD    Culture   Final    ABUNDANT CITROBACTER FREUNDII FEW VIRIDANS STREPTOCOCCUS CALL MICROBIOLOGY LAB IF SENSITIVITIES ARE REQUIRED. ABUNDANT PREVOTELLA DENTICOLA BETA LACTAMASE POSITIVE    Report Status 01/25/2017 FINAL   Final   Organism ID, Bacteria CITROBACTER FREUNDII  Final      Susceptibility   Citrobacter freundii - MIC*    CEFAZOLIN >=64 RESISTANT Resistant     CEFEPIME <=1 SENSITIVE Sensitive     CEFTAZIDIME >=64 RESISTANT Resistant     CEFTRIAXONE 16 INTERMEDIATE Intermediate     CIPROFLOXACIN <=0.25 SENSITIVE Sensitive     GENTAMICIN <=1 SENSITIVE Sensitive     IMIPENEM <=0.25 SENSITIVE Sensitive     TRIMETH/SULFA <=20 SENSITIVE Sensitive     PIP/TAZO 64 INTERMEDIATE Intermediate     * ABUNDANT CITROBACTER FREUNDII  Gram stain     Status: None   Collection Time: 01/23/17 12:53 PM  Result Value Ref Range Status   Specimen Description PLEURAL LEFT  Final   Special Requests NONE  Final   Gram Stain NO WBC SEEN NO ORGANISMS SEEN   Final   Report Status 01/23/2017 FINAL  Final  Culture, body fluid-bottle     Status: None   Collection Time: 01/23/17 12:53 PM  Result Value Ref Range Status   Specimen Description PLEURAL LEFT  Final   Special Requests NONE  Final   Culture NO GROWTH 5 DAYS  Final   Report Status 01/28/2017 FINAL  Final  Aerobic/Anaerobic Culture (surgical/deep wound)     Status: None (Preliminary result)   Collection Time: 01/27/17  1:58 PM  Result Value Ref Range Status   Specimen Description ABSCESS PLEURAL LEFT  Final   Special Requests SYRINGE  Final   Gram Stain   Final    RARE WBC PRESENT,BOTH PMN AND MONONUCLEAR NO ORGANISMS SEEN    Culture NO GROWTH < 24 HOURS  Final   Report Status PENDING  Incomplete  Aerobic/Anaerobic Culture (surgical/deep wound)     Status: None (Preliminary result)   Collection Time: 01/27/17  2:34 PM  Result Value Ref Range Status   Specimen Description ABDOMEN  Final   Special Requests RIGHT PERIHEPATIC FLUID  Final   Gram Stain   Final    MODERATE WBC PRESENT,BOTH PMN AND MONONUCLEAR NO ORGANISMS SEEN    Culture   Final    NO GROWTH 2 DAYS NO ANAEROBES ISOLATED; CULTURE IN PROGRESS FOR 5 DAYS   Report Status PENDING  Incomplete      Studies/Results: Dg Chest Port 1 View  Result Date: 01/29/2017 CLINICAL DATA:  Pleural effusion.  Chest tube. EXAM: PORTABLE CHEST 1 VIEW COMPARISON:  01/28/2017 . FINDINGS: Bilateral chest tubes again noted . right PICC line stable position. Cardiomegaly with bilateral pulmonary infiltrates most  consistent pulmonary edema. Small bilateral pleural effusions again noted. IMPRESSION: 1. Bilateral chest tubes and right PICC line stable position. No pneumothorax. 2. Findings consistent congestive heart failure bilateral from interstitial edema bilateral pleural effusions again noted. No interim change . Electronically Signed   By: Maisie Fushomas  Register   On: 01/29/2017 07:29   Dg Chest Port 1 View  Result Date: 01/28/2017 CLINICAL DATA:  Follow-up bilateral pleural effusions with chest tube treatment. The patient underwent recent bariatric surgery. EXAM: PORTABLE CHEST 1 VIEW COMPARISON:  Portable chest x-ray of January 27, 2017 FINDINGS: The lungs are well-expanded. Pleural fluid layer sposteriorly and laterally, bilaterally. The small caliber pigtail pleural drainage catheters are in stable position at the lung bases. The hemidiaphragms remain obscured. The cardiac silhouette is mildly enlarged. The central pulmonary vascularity is prominent. The interstitial markings remain increased bilaterally The PICC line tip projects over the proximal SVC. The bony thorax exhibits no acute abnormality. IMPRESSION: Stable appearance of the chest since yesterday's study. Persistent bilateral pleural effusions layering inferiorly and posteriorly. Mild cardiomegaly and central pulmonary vascular congestion with mild pulmonary interstitial edema. Electronically Signed   By: David  SwazilandJordan M.D.   On: 01/28/2017 07:36   Dg Chest Port 1 View  Result Date: 01/27/2017 CLINICAL DATA:  Pleural effusions EXAM: PORTABLE CHEST 1 VIEW COMPARISON:  01/26/2017 FINDINGS: Pigtail catheters project over each lung base with hazy adjacent  opacities presumably due to layering effusions and adjacent atelectasis. No pneumothorax is identified. Right-sided PICC line tip is seen in the mid SVC. Heart is borderline enlarged with mild vascular congestion. No acute nor suspicious osseous abnormalities. IMPRESSION: 1. New pigtail catheters are seen at each lung base superimposed on presumed layering pleural effusions and atelectasis. 2. No pneumothorax. 3. Stable cardiomegaly. Electronically Signed   By: Tollie Ethavid  Kwon M.D.   On: 01/27/2017 19:54   Ct Image Guided Drainage By Percutaneous Catheter  Result Date: 01/27/2017 INDICATION: 41 year old male with a history of gastric bypass surgery and multiple intra-abdominal fluid collections and exited infusion of the left chest. EXAM: CT-GUIDED DRAIN OF RIGHT PERI HEPATIC/SUBCAPSULAR FLUID. CT-GUIDED LEFT CHEST TUBE PLACEMENT MEDICATIONS: The patient is currently admitted to the hospital and receiving intravenous antibiotics. The antibiotics were administered within an appropriate time frame prior to the initiation of the procedure. ANESTHESIA/SEDATION: 3.0 mg IV Versed 200 mcg IV Fentanyl Moderate Sedation Time:  31 minutes The patient was continuously monitored during the procedure by the interventional radiology nurse under my direct supervision. COMPLICATIONS: None TECHNIQUE: Informed written consent was obtained from the patient after a thorough discussion of the procedural risks, benefits and alternatives. All questions were addressed. Maximal Sterile Barrier Technique was utilized including caps, mask, sterile gowns, sterile gloves, sterile drape, hand hygiene and skin antiseptic. A timeout was performed prior to the initiation of the procedure. PROCEDURE: Patient is positioned supine position on the CT gantry table. Scout CT image of the upper abdomen and lower chest performed for planning purposes. The right upper abdomen was addressed. The right upper abdomen was prepped with chlorhexidine in a sterile  fashion, and a sterile drape was applied covering the operative field. A sterile gown and sterile gloves were used for the procedure. Local anesthesia was provided with 1% Lidocaine. Once the patient is prepped and draped in the usual sterile fashion, the skin and subcutaneous tissues were generously infiltrated 1% lidocaine for local anesthesia. Using a trocar needle, the fluid at the right peripheral liver was approach. Once we confirmed the needle tip within the  fluid, modified Seldinger technique was used to place a 12 [place] drain. Drain was sutured in position and attached to gravity drainage. The left chest was then addressed. The lower left chest was prepped with chlorhexidine in a sterile fashion common sterile drape was applied covering the operative field. Sterile gown and gloves were used. Local anesthesia provided with 1% lidocaine. Once the patient was prepped and draped in the usual sterile fashion, the skin and subcutaneous tissues were generously infiltrated 1% lidocaine for local anesthesia. Using CT guidance, trocar needle was advanced into the fluid of the left pleural space. Using modified Seldinger technique, a 10 French drain was placed. This was attached to water seal chamber. Culture from the abdomen and the chest were sent to the lab. Final CT image was acquired. Patient tolerated the procedure well and remained hemodynamically stable throughout. No complications were encountered and no significant blood loss. FINDINGS: Initial CT image demonstrates perihepatic fluid collection of the right aspect of the liver, likely subcapsular given the configuration. Unchanged from prior. Drainage catheter terminates in the midline adjacent to the surgical changes of gastric bypass, similar prior. Small fluid adjacent to the spleen. Unchanged appearance of left pleural fluid. Status post drainage of right upper abdomen and left chest fluid, decreased size of the fluid collection of the right abdomen,  with unchanged size of fluid collection in the chest. Paragraphs sample was sent for me age fluid location. IMPRESSION: Status post CT-guided 12 French drain placement of the right upper abdomen, and CT-guided left thoracostomy tube with 10 French drain placed. Sample was sent to the lab for culture from each location. Signed, Yvone Neu. Loreta Ave, DO [facility] Radiology Electronically Signed   By: Gilmer Mor D.O.   On: 01/27/2017 15:32   Ct Image Guided Drainage By Percutaneous Catheter  Result Date: 01/27/2017 INDICATION: 41 year old male with a history of gastric bypass surgery and multiple intra-abdominal fluid collections and exited infusion of the left chest. EXAM: CT-GUIDED DRAIN OF RIGHT PERI HEPATIC/SUBCAPSULAR FLUID. CT-GUIDED LEFT CHEST TUBE PLACEMENT MEDICATIONS: The patient is currently admitted to the hospital and receiving intravenous antibiotics. The antibiotics were administered within an appropriate time frame prior to the initiation of the procedure. ANESTHESIA/SEDATION: 3.0 mg IV Versed 200 mcg IV Fentanyl Moderate Sedation Time:  31 minutes The patient was continuously monitored during the procedure by the interventional radiology nurse under my direct supervision. COMPLICATIONS: None TECHNIQUE: Informed written consent was obtained from the patient after a thorough discussion of the procedural risks, benefits and alternatives. All questions were addressed. Maximal Sterile Barrier Technique was utilized including caps, mask, sterile gowns, sterile gloves, sterile drape, hand hygiene and skin antiseptic. A timeout was performed prior to the initiation of the procedure. PROCEDURE: Patient is positioned supine position on the CT gantry table. Scout CT image of the upper abdomen and lower chest performed for planning purposes. The right upper abdomen was addressed. The right upper abdomen was prepped with chlorhexidine in a sterile fashion, and a  sterile drape was applied covering the operative field. A sterile gown and sterile gloves were used for the procedure. Local anesthesia was provided with 1% Lidocaine. Once the patient is prepped and draped in the usual sterile fashion, the skin and subcutaneous tissues were generously infiltrated 1% lidocaine for local anesthesia. Using a trocar needle, the fluid at the right peripheral liver was approach. Once we confirmed the needle tip within the fluid, modified Seldinger technique was used to place a 12 [place]  drain. Drain was sutured in position and attached to gravity drainage. The left chest was then addressed. The lower left chest was prepped with chlorhexidine in a sterile fashion common sterile drape was applied covering the operative field. Sterile gown and gloves were used. Local anesthesia provided with 1% lidocaine. Once the patient was prepped and draped in the usual sterile fashion, the skin and subcutaneous tissues were generously infiltrated 1% lidocaine for local anesthesia. Using CT guidance, trocar needle was advanced into the fluid of the left pleural space. Using modified Seldinger technique, a 10 French drain was placed. This was attached to water seal chamber. Culture from the abdomen and the chest were sent to the lab. Final CT image was acquired. Patient tolerated the procedure well and remained hemodynamically stable throughout. No complications were encountered and no significant blood loss. FINDINGS: Initial CT image demonstrates perihepatic fluid collection of the right aspect of the liver, likely subcapsular given the configuration. Unchanged from prior. Drainage catheter terminates in the midline adjacent to the surgical changes of gastric bypass, similar prior. Small fluid adjacent to the spleen. Unchanged appearance of left pleural fluid. Status post drainage of right upper abdomen and left chest fluid, decreased size of the fluid collection of the right abdomen, with unchanged  size of fluid collection in the chest. Paragraphs sample was sent for me age fluid location. IMPRESSION: Status post CT-guided 12 French drain placement of the right upper abdomen, and CT-guided left thoracostomy tube with 10 French drain placed. Sample was sent to the lab for culture from each location. Signed, Yvone Neu. Loreta Ave, DO [facility] Radiology Electronically Signed   By: Gilmer Mor D.O.   On: 01/27/2017 15:32     Assessment/Plan: Abdominal abscesses  S/P Gastric surgery 12-08-16  DM2 x1 year Morbid obesity  Total days of antibiotics: 14 cefepime/flagyl ---> invanz/flagyl   Unfortunately unable to stop flagyl for gardnerella. (started 9-7) He is otherwise doing well.  His Cx from 9-11 are no growth so far.  His Glc are well controlled. Defer to primary team on repeating his CT scan.           Johny Sax Infectious Diseases (pager) [phone] [email or web address] 01/29/2017, 2:05 PM  LOS: 14 days

## 2017-01-29 NOTE — Progress Notes (Signed)
PULMONARY / CRITICAL CARE MEDICINE   Name: Albert White MRN: [id number] DOB: Dec 22, 1975    ADMISSION DATE:  01/15/2017 CONSULTATION DATE:  01/25/2017  REFERRING MD:  Dr. Dorris Fetch  CHIEF COMPLAINT: Left  Pleural effusion  Brief patient summary:  41 yo male had roux en y surgery at [facility] July 23.  He developed scrotal swelling and pain.  Found to have hydrocele and varicocele.  He was then found to have abdominal wall hematoma, fever 103F, and seroma.  He had CT abd which showed abscess.  He was admitted to [facility] and seen by surgery.  He had drain placed by IR and started on Abx.  He was noted to have b/l pleural effusions.  He had attempted thoracentesis with only 25 ml of fluid able to be removed.  He was seen by thoracic surgery, who requested a PCCM consult.  SUBJECTIVE/OVERNIGHT/INTERVAL HX:  No new complaints or SOB.   Total of 50 ml serous drainage from Left CT since yesterday (including flushes)  VITAL SIGNS: BP (!) 140/91 (BP Location: Left Arm)   Pulse (!) 102   Temp 97.6 F (36.4 C) (Oral)   Resp 18   Ht  (1.778 m)   Wt (!) 340 lb 12.8 oz (154.6 kg)   SpO2 96%   BMI 48.90 kg/m   INTAKE / OUTPUT: I/O last 3 completed shifts: In: 6783 [P.O.:1640; I.V.:4508; Other:115; IV Piggyback:520] Out: 4098 [JXBJY:7829; Drains:260]  PHYSICAL EXAMINATION: General:  Adult male sitting in recliner in NAD HEENT: MM pink/moist Neuro: AO, nonfocal CV : rrr PULM: even/non-labored, lungs bilaterally clear/distant, diminished in LLL, pigtail CT to left lateral at 20cm sxn, no fluctuation or airleak, on [place] 2L  GI: obese, bs +, abd drain x 3 soft Extremities: warm/dry, trace BLE edema  Skin: no rashes   LABS:  BMET  Recent Labs Lab 01/26/17 0433 01/27/17 0551 01/29/17 0405  NA 133* 133* 133*  K 4.0 4.0 4.0  CL 98* 98* 98*  CO2 BUN CREATININE 0.48* 0.48* 0.46*  GLUCOSE 137* 158* 131*    Electrolytes  Recent Labs Lab  01/26/17 0433 01/27/17 0551 01/29/17 0405  CALCIUM 8.3* 8.1* 8.2*  MG 1.7 1.7 1.5*  PHOS 4.7* 4.4 4.5    CBC  Recent Labs Lab 01/25/17 0458 01/26/17 0433 01/27/17 0551  WBC 4.7 5.5 5.4  HGB 8.3* 8.2* 8.2*  HCT 27.9* 27.9* 27.3*  PLT 418* 432* 444*    Coag's  Recent Labs Lab 01/23/17 0723  INR 1.12    Sepsis Markers No results for input(s): LATICACIDVEN, PROCALCITON, O2SATVEN in the last 168 hours.  ABG No results for input(s): PHART, PCO2ART, PO2ART in the last 168 hours.  Liver Enzymes  Recent Labs Lab 01/23/17 1248 01/26/17 0433 01/27/17 1937 01/29/17 0405  AST 27 18  --  17  ALT 19 15*  --  10*  ALKPHOS 175* 116  --  83  BILITOT 0.8 0.4  --  0.2*  ALBUMIN 1.9* 1.9* 1.7* 1.8*    Cardiac Enzymes No results for input(s): TROPONINI, PROBNP in the last 168 hours.  Glucose  Recent Labs Lab 01/28/17 1213 01/28/17 1719 01/28/17 2014 01/28/17 2339 01/29/17 0401 01/29/17 0738  GLUCAP 129* 111* 108* 142* 132* 147*    Imaging Dg Chest Port 1 View  Result Date: 01/29/2017 CLINICAL DATA:  Pleural effusion.  Chest tube. EXAM: PORTABLE CHEST 1 VIEW COMPARISON:  01/28/2017 . FINDINGS: Bilateral chest tubes again noted .  right PICC line stable position. Cardiomegaly with bilateral pulmonary infiltrates most consistent pulmonary edema. Small bilateral pleural effusions again noted. IMPRESSION: 1. Bilateral chest tubes and right PICC line stable position. No pneumothorax. 2. Findings consistent congestive heart failure bilateral from interstitial edema bilateral pleural effusions again noted. No interim change . Electronically Signed   By: Maisie Fushomas  Register   On: 01/29/2017 07:29    STUDIES:  Lt pleural fluid 9/07 >> glucose 130, protein 3.8, LDH 453, WBC 3750 (54 N, 43 L) CT chest 9/08 >> lower lobe consolidation b/l, mod Lt effusion, small Rt effusion  CULTURES: Blood 8/30 >> neg Abdominal abscess 8/31 >> Streptococcus constellatus, gardnerella  vaginalis Abdominal abscess 9/05 >> citrobacter freundii, viridans streptococcus Lt pleural fluid 9/07 >> neg Left pleural GS 9/07 >> neg Abdominal abscess 9/11 >> Left pleural fluid 9/11 >> Left pleural GS 9/11>>  ANTIBIOTICS: Zosyn 8/30 >> 9/05 Vancomycin 9/05 >> 9/06 Cefepime 9/07 >>9/11 Flagyl 9/07 >>  Ertapenem 9/11 >>  SIGNIFICANT EVENTS: 8/31 Admit, surgery consulted, abscess drain by IR 9/04 2nd drain by IR 9/07 Lt thoracentesis 9/09 Pulmonary consulted  9/11 IR pig tail placement / Additional abdominal drain  LINES/TUBES: Rt PICC 8/31 >> RLQ drain 8/31 >> Peri-epigastric drain 9/5 >> IR PERC Left Pleural Pigtail CT 9/11 >> IR PERC Right abd perihepatic drain 9/11 >>  ASSESSMENT / PLAN:  Exudative Lt pleural effusion- loculated -s/p IR guided pig tail 9/11 w/ serosanguinous to now serous drainage.  Initial 400ml out- prior to flushing,  - no major change on CXR 9/13 - Left pleural culture negative from 9/7 - by Lights criteria, LDH ratio 1.8, pleural fluid is exudate  P:  Will consult with Dr. Tyson AliasFeinstein about CT lytic therapy Continue Cook catheter flush order set Encourage mobilization/ pulmonary hygiene Follow pleural studies Ensure adequate pain control Titrate oxygen to Maintain oxygen saturations> 94%  CPAP per home settings q HS Goal net even/neg I/O balance   Posey BoyerBrooke Simpson, AGACNP-BC [facility] [facility] Pgr: [phone] or if no answer [phone] 01/29/2017, 11:11 AM  STAFF NOTE: I, Rory Percyaniel Roselani Grajeda, MD FACP have personally reviewed patient's available data, including medical history, events of note, physical examination and test results as part of my evaluation. I have discussed with resident/NP and other care providers such as pharmacist, RN and RRT. In addition, I personally evaluated patient and elicited key findings of: awake, alert, no distress, reduced BS some crackles left base, edema remains, slight pos balance last 24 hours, pcxr  which I reviewed  Showed unchanged bilateral hazziness, even with saline flush into CT we have limited chest tube output, it is NOT obstructed, given poor output and prior thora attempt with thickened unobtainable  able chest tube output, we should use DNase and tpa into chest tube and assess follow up pcxr and output then and clinical status, we need to maximize his neg balance further, follow chem, tpn on going, need to mobilize this is improving,. ABX per ID, I updated pt  Mcarthur Rossettianiel J. Tyson AliasFeinstein, MD, FACP Pgr: [phone] [facility] 01/29/2017 2:51 PM

## 2017-01-29 NOTE — Progress Notes (Signed)
   RN called saying TPA has arrived. Information passed to Posey BoyerBrooke Simpson APP  Dr. Kalman ShanMurali Kiyana Vazguez, M.D., [facility] CenterF.C.C.P Pulmonary and Critical Care Medicine Staff Physician [facility] [place] [facility] Pager: [phone], If no answer or between  15:00h - 7:00h: call [phone]  01/29/2017 4:27 PM

## 2017-01-29 NOTE — Progress Notes (Signed)
Patient refuses CPAP 

## 2017-01-29 NOTE — Progress Notes (Signed)
Alteplase 20 mg instilled intrapleurally in left pigtail CT at 1705 via 3 way stop-cock for loculated exudative pleural effusion and clamped.  Patient instructed to turn frequently side to side.    Then at  1805, Dornase alpha (Pulmozyme) 5 mg instilled via 3 way stopcock and clamped/ off to drainage.  Patient again, instructed to turn side to side frequently over the next hour.    At 1905, RN to open 3 way and return CT to 20 cm of sxn.  RN verbalizes understanding.  Posey BoyerBrooke Burlene Montecalvo, AGACNP-BC [facility] Pgr: [phone] or if no answer [phone] 01/29/2017, 6:05 PM

## 2017-01-29 NOTE — Progress Notes (Signed)
PROGRESS NOTE    Charlyne PetrinSteven T Schmader  RUE:[id number]:[id number] DOB: [birth year] DOA: 01/15/2017 PCP: Alysia PennaHolwerda, Scott, MD   Brief Narrative: 41 year old male with history of type 2 diabetes, dyslipidemia, obstructive sleep apnea, Roux-en-Y/gastric [place]. Franki Cabothomas Walsh, rehospitalized 12/30/16-01/10/17 for anasarca, abdominal pain, fever of 103F and postoperative seroma for which a percutaneous drain was placed. After discharge from hospital patient continued to have abdominal pain, associated with nausea, emesis and decreased appetite, outpatient CT of the abdomen showedgas containing 10.7 x 5 cm abscess abutting the posterior margin of the GJ anastomosis worrisome for dehiscence, diffuse peritonitis with large gas containing abscess throughout the peritoneal cavity including a right perihepatic abscess compressing the right liver lobe and provoking anileus, findings that prompted rehospitalization.   Patient was admitted to the hospital with the working diagnosis of abdominal sepsis due to peritonitis, and intra-abdominal abscess related to a postoperative complication of Roux-en-Y/gastric bypass (12/08/16).  Assessment & Plan:  # Sepsis due to peritonitis and multiple intra-abdominal abscess, colitis - s/p RLQ drain placement on 01/16/17, peri-epigastric drain placement 9/5  and hepatic abscess drain and chest tube placement by 9/11. IR consult appreciated. -Currently on Invanz, Flagyl as per infectious disease -Follow up culture results. -Currently on TPN. Monitor electrolytes  # Pain management: Patient reported the pain is not controlled. I increased the dose of oxycodone to 15 mg and continue on Dilaudid as needed. Add miralax.  #Pleural effusion: CT scan with a moderate left pleural effusion with loculation status post chest tube placement. CT surgery following. -Pulmonary is following for possible Lytic therapy. Follow pulmonary's plan -repeat CXR with congestion,  check echo  #Type 2 diabetes: Monitor blood sugar level. Continue sliding scale.  #Obstructive sleep apnea: Continue CPAP. Continue incentive spirometer  #Anemia multifactorial etiology, likely due to chronic disease: Monitor CBC. No sign of bleeding  #History of morbid obesity  Patient with multiple surgical issues and related complications. Managed by multiple specialists including infectious disease, general surgery, interventional radiology, pulmonologist, cardiothoracic surgeon, alll appreciated. I discussed with the surgery team today if the general surgery should be the primary team as most of the issues related with surgery.  Active Problems:   Postprocedural intraabdominal abscess   Pleural effusion   Peritonitis (HCC)   Gastrointestinal anastomotic leak   Acute pulmonary edema (HCC)  DVT prophylaxis: Lovenox subcutaneous Code Status: Full code Family Communication: Discussed with the patient's wife at bedside Disposition Plan: Currently admitted    Consultants:   General surgery  IR  Infectious disease  Pulmonologist  Cardiothoracic surgery  Pharmacist  Procedures: Multiple drain placement Antimicrobials: Currently on Invanz and Flagyl  Subjective: Seen and examined at bedside. Patient reported that the pain is not controlled with current medication. Has mild shortness of breath. Denied chest pain, nausea vomiting or abdominal pain.  Objective: Vitals:   01/28/17 0445 01/28/17 1514 01/28/17 2015 01/29/17 0411  BP: (!) 143/87 134/81 (!) 149/90 (!) 140/91  Pulse: 94 (!) 102 (!) 102 (!) 102  Resp: 18 18 18 18   Temp: 98.1 F (36.7 C) 97.8 F (36.6 C) 98.1 F (36.7 C) 97.6 F (36.4 C)  TempSrc: Oral Oral Oral Oral  SpO2: 94% 98% 97% 96%  Weight:      Height:        Intake/Output Summary (Last 24 hours) at 01/29/17 1202 Last data filed at 01/29/17 1100  Gross per 24 hour  Intake             3440  ml  Output             3835 ml  Net              -395 ml   Filed Weights   01/24/17 0418 01/25/17 0416 01/26/17 0409  Weight: (!) 151.4 kg (333 lb 12.8 oz) (!) 155.4 kg (342 lb 11.2 oz) (!) 154.6 kg (340 lb 12.8 oz)    Examination:  General exam: Sitting on chair comfortable Respiratory system: Bibasal decreased breath sound, respiratory effort normal, has chest drain Cardiovascular system: Regular rate rhythm, S1 is normal.  No pedal edema. Gastrointestinal system: Abdomen soft, mild distention with multiple drains. Bowel sound positive Central nervous system: Alert awake and following commands. Skin: No rashes, lesions or ulcers Psychiatry: Judgement and insight appear normal. Mood & affect appropriate.     Data Reviewed: I have personally reviewed following labs and imaging studies  CBC:  Recent Labs Lab 01/23/17 1248 01/25/17 0458 01/26/17 0433 01/27/17 0551  WBC 6.9 4.7 5.5 5.4  NEUTROABS  --   --  3.5  --   HGB 9.4* 8.3* 8.2* 8.2*  HCT 31.2* 27.9* 27.9* 27.3*  MCV 85.2 85.6 85.6 85.3  PLT 401* 418* 432* 444*   Basic Metabolic Panel:  Recent Labs Lab 01/24/17 0501 01/25/17 0458 01/26/17 0433 01/27/17 0551 01/29/17 0405  NA 133* 135 133* 133* 133*  K 3.8 4.0 4.0 4.0 4.0  CL 97* 100* 98* 98* 98*  CO2 GLUCOSE 169* 160* 137* 158* 131*  BUN CREATININE 0.48* 0.46* 0.48* 0.48* 0.46*  CALCIUM 8.1* 8.2* 8.3* 8.1* 8.2*  MG 1.7  --  1.7 1.7 1.5*  PHOS 3.9  --  4.7* 4.4 4.5   GFR: Estimated Creatinine Clearance: 183.3 mL/min (A) (by C-G formula based on SCr of 0.46 mg/dL (L)). Liver Function Tests:  Recent Labs Lab 01/23/17 1248 01/26/17 0433 01/27/17 1937 01/29/17 0405  AST 27 18  --  17  ALT 19 15*  --  10*  ALKPHOS 175* 116  --  83  BILITOT 0.8 0.4  --  0.2*  PROT 6.7 6.4*  --  6.0*  ALBUMIN 1.9* 1.9* 1.7* 1.8*   No results for input(s): LIPASE, AMYLASE in the last 168 hours. No results for input(s): AMMONIA in the last 168 hours. Coagulation Profile:  Recent  Labs Lab 01/23/17 0723  INR 1.12   Cardiac Enzymes: No results for input(s): CKTOTAL, CKMB, CKMBINDEX, TROPONINI in the last 168 hours. BNP (last 3 results) No results for input(s): PROBNP in the last 8760 hours. HbA1C: No results for input(s): HGBA1C in the last 72 hours. CBG:  Recent Labs Lab 01/28/17 1719 01/28/17 2014 01/28/17 2339 01/29/17 0401 01/29/17 0738  GLUCAP 111* 108* 142* 132* 147*   Lipid Profile: No results for input(s): CHOL, HDL, LDLCALC, TRIG, CHOLHDL, LDLDIRECT in the last 72 hours. Thyroid Function Tests: No results for input(s): TSH, T4TOTAL, FREET4, T3FREE, THYROIDAB in the last 72 hours. Anemia Panel: No results for input(s): VITAMINB12, FOLATE, FERRITIN, TIBC, IRON, RETICCTPCT in the last 72 hours. Sepsis Labs: No results for input(s): PROCALCITON, LATICACIDVEN in the last 168 hours.  Recent Results (from the past 240 hour(s))  Aerobic/Anaerobic Culture (surgical/deep wound)     Status: None   Collection Time: 01/21/17 10:04 AM  Result Value Ref Range Status   Specimen Description ABSCESS  Final   Special Requests DRAIN POST CT GUIDED PLACEMENT  Final  Gram Stain   Final    ABUNDANT WBC PRESENT,BOTH PMN AND MONONUCLEAR ABUNDANT GRAM POSITIVE COCCI IN PAIRS ABUNDANT GRAM VARIABLE ROD    Culture   Final    ABUNDANT CITROBACTER FREUNDII FEW VIRIDANS STREPTOCOCCUS CALL MICROBIOLOGY LAB IF SENSITIVITIES ARE REQUIRED. ABUNDANT PREVOTELLA DENTICOLA BETA LACTAMASE POSITIVE    Report Status 01/25/2017 FINAL  Final   Organism ID, Bacteria CITROBACTER FREUNDII  Final      Susceptibility   Citrobacter freundii - MIC*    CEFAZOLIN >=64 RESISTANT Resistant     CEFEPIME <=1 SENSITIVE Sensitive     CEFTAZIDIME >=64 RESISTANT Resistant     CEFTRIAXONE 16 INTERMEDIATE Intermediate     CIPROFLOXACIN <=0.25 SENSITIVE Sensitive     GENTAMICIN <=1 SENSITIVE Sensitive     IMIPENEM <=0.25 SENSITIVE Sensitive     TRIMETH/SULFA <=20 SENSITIVE Sensitive      PIP/TAZO 64 INTERMEDIATE Intermediate     * ABUNDANT CITROBACTER FREUNDII  Gram stain     Status: None   Collection Time: 01/23/17 12:53 PM  Result Value Ref Range Status   Specimen Description PLEURAL LEFT  Final   Special Requests NONE  Final   Gram Stain NO WBC SEEN NO ORGANISMS SEEN   Final   Report Status 01/23/2017 FINAL  Final  Culture, body fluid-bottle     Status: None   Collection Time: 01/23/17 12:53 PM  Result Value Ref Range Status   Specimen Description PLEURAL LEFT  Final   Special Requests NONE  Final   Culture NO GROWTH 5 DAYS  Final   Report Status 01/28/2017 FINAL  Final  Aerobic/Anaerobic Culture (surgical/deep wound)     Status: None (Preliminary result)   Collection Time: 01/27/17  1:58 PM  Result Value Ref Range Status   Specimen Description ABSCESS PLEURAL LEFT  Final   Special Requests SYRINGE  Final   Gram Stain   Final    RARE WBC PRESENT,BOTH PMN AND MONONUCLEAR NO ORGANISMS SEEN    Culture NO GROWTH < 24 HOURS  Final   Report Status PENDING  Incomplete  Aerobic/Anaerobic Culture (surgical/deep wound)     Status: None (Preliminary result)   Collection Time: 01/27/17  2:34 PM  Result Value Ref Range Status   Specimen Description ABDOMEN  Final   Special Requests RIGHT PERIHEPATIC FLUID  Final   Gram Stain   Final    MODERATE WBC PRESENT,BOTH PMN AND MONONUCLEAR NO ORGANISMS SEEN    Culture NO GROWTH < 24 HOURS  Final   Report Status PENDING  Incomplete         Radiology Studies: Dg Chest Port 1 View  Result Date: 01/29/2017 CLINICAL DATA:  Pleural effusion.  Chest tube. EXAM: PORTABLE CHEST 1 VIEW COMPARISON:  01/28/2017 . FINDINGS: Bilateral chest tubes again noted . right PICC line stable position. Cardiomegaly with bilateral pulmonary infiltrates most consistent pulmonary edema. Small bilateral pleural effusions again noted. IMPRESSION: 1. Bilateral chest tubes and right PICC line stable position. No pneumothorax. 2. Findings consistent  congestive heart failure bilateral from interstitial edema bilateral pleural effusions again noted. No interim change . Electronically Signed   By: Maisie Fus  Register   On: 01/29/2017 07:29   Dg Chest Port 1 View  Result Date: 01/28/2017 CLINICAL DATA:  Follow-up bilateral pleural effusions with chest tube treatment. The patient underwent recent bariatric surgery. EXAM: PORTABLE CHEST 1 VIEW COMPARISON:  Portable chest x-ray of January 27, 2017 FINDINGS: The lungs are well-expanded. Pleural fluid layer sposteriorly and laterally, bilaterally. The  small caliber pigtail pleural drainage catheters are in stable position at the lung bases. The hemidiaphragms remain obscured. The cardiac silhouette is mildly enlarged. The central pulmonary vascularity is prominent. The interstitial markings remain increased bilaterally The PICC line tip projects over the proximal SVC. The bony thorax exhibits no acute abnormality. IMPRESSION: Stable appearance of the chest since yesterday's study. Persistent bilateral pleural effusions layering inferiorly and posteriorly. Mild cardiomegaly and central pulmonary vascular congestion with mild pulmonary interstitial edema. Electronically Signed   By: David  [place] M.D.   On: 01/28/2017 07:36   Dg Chest Port 1 View  Result Date: 01/27/2017 CLINICAL DATA:  Pleural effusions EXAM: PORTABLE CHEST 1 VIEW COMPARISON:  01/26/2017 FINDINGS: Pigtail catheters project over each lung base with hazy adjacent opacities presumably due to layering effusions and adjacent atelectasis. No pneumothorax is identified. Right-sided PICC line tip is seen in the mid SVC. Heart is borderline enlarged with mild vascular congestion. No acute nor suspicious osseous abnormalities. IMPRESSION: 1. New pigtail catheters are seen at each lung base superimposed on presumed layering pleural effusions and atelectasis. 2. No pneumothorax. 3. Stable cardiomegaly. Electronically Signed   By: Tollie Eth M.D.   On:  01/27/2017 19:54   Ct Image Guided Drainage By Percutaneous Catheter  Result Date: 01/27/2017 INDICATION: 41 year old male with a history of gastric bypass surgery and multiple intra-abdominal fluid collections and exited infusion of the left chest. EXAM: CT-GUIDED DRAIN OF RIGHT PERI HEPATIC/SUBCAPSULAR FLUID. CT-GUIDED LEFT CHEST TUBE PLACEMENT MEDICATIONS: The patient is currently admitted to the hospital and receiving intravenous antibiotics. The antibiotics were administered within an appropriate time frame prior to the initiation of the procedure. ANESTHESIA/SEDATION: 3.0 mg IV Versed 200 mcg IV Fentanyl Moderate Sedation Time:  31 minutes The patient was continuously monitored during the procedure by the interventional radiology nurse under my direct supervision. COMPLICATIONS: None TECHNIQUE: Informed written consent was obtained from the patient after a thorough discussion of the procedural risks, benefits and alternatives. All questions were addressed. Maximal Sterile Barrier Technique was utilized including caps, mask, sterile gowns, sterile gloves, sterile drape, hand hygiene and skin antiseptic. A timeout was performed prior to the initiation of the procedure. PROCEDURE: Patient is positioned supine position on the CT gantry table. Scout CT image of the upper abdomen and lower chest performed for planning purposes. The right upper abdomen was addressed. The right upper abdomen was prepped with chlorhexidine in a sterile fashion, and a sterile drape was applied covering the operative field. A sterile gown and sterile gloves were used for the procedure. Local anesthesia was provided with 1% Lidocaine. Once the patient is prepped and draped in the usual sterile fashion, the skin and subcutaneous tissues were generously infiltrated 1% lidocaine for local anesthesia. Using a trocar needle, the fluid at the right peripheral liver was approach. Once we confirmed the needle tip within the fluid, modified  Seldinger technique was used to place a 12 [place] drain. Drain was sutured in position and attached to gravity drainage. The left chest was then addressed. The lower left chest was prepped with chlorhexidine in a sterile fashion common sterile drape was applied covering the operative field. Sterile gown and gloves were used. Local anesthesia provided with 1% lidocaine. Once the patient was prepped and draped in the usual sterile fashion, the skin and subcutaneous tissues were generously infiltrated 1% lidocaine for local anesthesia. Using CT guidance, trocar needle was advanced into the fluid of the left pleural space. Using modified Seldinger technique, a 10 [place] drain  was placed. This was attached to water seal chamber. Culture from the abdomen and the chest were sent to the lab. Final CT image was acquired. Patient tolerated the procedure well and remained hemodynamically stable throughout. No complications were encountered and no significant blood loss. FINDINGS: Initial CT image demonstrates perihepatic fluid collection of the right aspect of the liver, likely subcapsular given the configuration. Unchanged from prior. Drainage catheter terminates in the midline adjacent to the surgical changes of gastric bypass, similar prior. Small fluid adjacent to the spleen. Unchanged appearance of left pleural fluid. Status post drainage of right upper abdomen and left chest fluid, decreased size of the fluid collection of the right abdomen, with unchanged size of fluid collection in the chest. Paragraphs sample was sent for me age fluid location. IMPRESSION: Status post CT-guided 12 French drain placement of the right upper abdomen, and CT-guided left thoracostomy tube with 10 French drain placed. Sample was sent to the lab for culture from each location. Signed, Yvone Neu. Loreta Ave, DO [facility] Electronically Signed   By: Gilmer Mor D.O.   On: 01/27/2017  15:32   Ct Image Guided Drainage By Percutaneous Catheter  Result Date: 01/27/2017 INDICATION: 41 year old male with a history of gastric bypass surgery and multiple intra-abdominal fluid collections and exited infusion of the left chest. EXAM: CT-GUIDED DRAIN OF RIGHT PERI HEPATIC/SUBCAPSULAR FLUID. CT-GUIDED LEFT CHEST TUBE PLACEMENT MEDICATIONS: The patient is currently admitted to the hospital and receiving intravenous antibiotics. The antibiotics were administered within an appropriate time frame prior to the initiation of the procedure. ANESTHESIA/SEDATION: 3.0 mg IV Versed 200 mcg IV Fentanyl Moderate Sedation Time:  31 minutes The patient was continuously monitored during the procedure by the interventional radiology nurse under my direct supervision. COMPLICATIONS: None TECHNIQUE: Informed written consent was obtained from the patient after a thorough discussion of the procedural risks, benefits and alternatives. All questions were addressed. Maximal Sterile Barrier Technique was utilized including caps, mask, sterile gowns, sterile gloves, sterile drape, hand hygiene and skin antiseptic. A timeout was performed prior to the initiation of the procedure. PROCEDURE: Patient is positioned supine position on the CT gantry table. Scout CT image of the upper abdomen and lower chest performed for planning purposes. The right upper abdomen was addressed. The right upper abdomen was prepped with chlorhexidine in a sterile fashion, and a sterile drape was applied covering the operative field. A sterile gown and sterile gloves were used for the procedure. Local anesthesia was provided with 1% Lidocaine. Once the patient is prepped and draped in the usual sterile fashion, the skin and subcutaneous tissues were generously infiltrated 1% lidocaine for local anesthesia. Using a trocar needle, the fluid at the right peripheral liver was approach. Once we confirmed the needle tip within the fluid, modified Seldinger  technique was used to place a 12 [place] drain. Drain was sutured in position and attached to gravity drainage. The left chest was then addressed. The lower left chest was prepped with chlorhexidine in a sterile fashion common sterile drape was applied covering the operative field. Sterile gown and gloves were used. Local anesthesia provided with 1% lidocaine. Once the patient was prepped and draped in the usual sterile fashion, the skin and subcutaneous tissues were generously infiltrated 1% lidocaine for local anesthesia. Using CT guidance, trocar needle was advanced into the fluid of the left pleural space. Using modified Seldinger technique, a 10 French drain was placed. This was attached to water seal chamber. Culture from  the abdomen and the chest were sent to the lab. Final CT image was acquired. Patient tolerated the procedure well and remained hemodynamically stable throughout. No complications were encountered and no significant blood loss. FINDINGS: Initial CT image demonstrates perihepatic fluid collection of the right aspect of the liver, likely subcapsular given the configuration. Unchanged from prior. Drainage catheter terminates in the midline adjacent to the surgical changes of gastric bypass, similar prior. Small fluid adjacent to the spleen. Unchanged appearance of left pleural fluid. Status post drainage of right upper abdomen and left chest fluid, decreased size of the fluid collection of the right abdomen, with unchanged size of fluid collection in the chest. Paragraphs sample was sent for me age fluid location. IMPRESSION: Status post CT-guided 12 French drain placement of the right upper abdomen, and CT-guided left thoracostomy tube with 10 French drain placed. Sample was sent to the lab for culture from each location. Signed, Yvone Neu. Loreta Ave, DO [facility] Electronically Signed   By: Gilmer Mor D.O.   On: 01/27/2017 15:32         Scheduled Meds: . acetaminophen  1,000 mg Oral Q8H  . chlorhexidine  15 mL Mouth Rinse BID  . Chlorhexidine Gluconate Cloth  6 each Topical Daily  . enoxaparin (LOVENOX) injection  40 mg Subcutaneous Q12H  . hydrocortisone   Rectal BID  . insulin aspart  0-20 Units Subcutaneous Q4H  . mouth rinse  15 mL Mouth Rinse q12n4p  . pneumococcal 23 valent vaccine  0.5 mL Intramuscular Tomorrow-1000  . protein supplement shake  2 oz Oral QID  . sodium chloride flush  5 mL Intravenous Q8H  . sodium chloride flush  5 mL Intravenous Q8H  . sodium chloride flush  5 mL Intravenous Q8H   Continuous Infusions: . Marland KitchenTPN (CLINIMIX-E) Adult 100 mL/hr at 01/28/17 1900  . Marland KitchenTPN (CLINIMIX-E) Adult     And  . fat emulsion    . sodium chloride 35 mL/hr (01/29/17 0235)  . ertapenem Stopped (01/28/17 1836)  . magnesium sulfate 1 - 4 g bolus IVPB    . methocarbamol (ROBAXIN)  IV 1,000 mg (01/29/17 0536)  . metronidazole Stopped (01/29/17 0910)     LOS: 14 days    Iliani Vejar Jaynie Collins, MD [facility] Pager [phone]  If 7PM-7AM, please contact night-coverage [email or web address] Password [facility] 01/29/2017, 12:02 PM

## 2017-01-29 NOTE — Progress Notes (Signed)
All drains flushed x 2 per orders this shift and drsg's changed per orders. No dng noted from around drain sites.

## 2017-01-29 NOTE — Progress Notes (Signed)
Subjective/Chief Complaint: Feels ok taking protein shakes    Objective: Vital signs in last 24 hours: Temp:  [97.6 F (36.4 C)-98.1 F (36.7 C)] 97.6 F (36.4 C) (09/13 0411) Pulse Rate:  [102] 102 (09/13 0411) Resp:  [18] 18 (09/13 0411) BP: (134-149)/(81-91) 140/91 (09/13 0411) SpO2:  [96 %-98 %] 96 % (09/13 0411) Last BM Date: 01/26/17  Intake/Output from previous day: 09/12 0701 - 09/13 0700 In: 4075 [P.O.:1400; I.V.:2100; IV Piggyback:520] Out: 3335 [Urine:3135; Drains:200] Intake/Output this shift: Total I/O In: 360 [P.O.:360] Out: 700 [Urine:700]  Incision/Wound:port sites noted  Drains in place   Lab Results:   Recent Labs  01/27/17 0551  WBC 5.4  HGB 8.2*  HCT 27.3*  PLT 444*   BMET  Recent Labs  01/27/17 0551 01/29/17 0405  NA 133* 133*  K 4.0 4.0  CL 98* 98*  CO2 28 28  GLUCOSE 158* 131*  BUN 8 9  CREATININE 0.48* 0.46*  CALCIUM 8.1* 8.2*   PT/INR No results for input(s): LABPROT, INR in the last 72 hours. ABG No results for input(s): PHART, HCO3 in the last 72 hours.  Invalid input(s): PCO2, PO2  Studies/Results: Dg Chest Port 1 View  Result Date: 01/29/2017 CLINICAL DATA:  Pleural effusion.  Chest tube. EXAM: PORTABLE CHEST 1 VIEW COMPARISON:  01/28/2017 . FINDINGS: Bilateral chest tubes again noted . right PICC line stable position. Cardiomegaly with bilateral pulmonary infiltrates most consistent pulmonary edema. Small bilateral pleural effusions again noted. IMPRESSION: 1. Bilateral chest tubes and right PICC line stable position. No pneumothorax. 2. Findings consistent congestive heart failure bilateral from interstitial edema bilateral pleural effusions again noted. No interim change . Electronically Signed   By: Maisie Fus  Register   On: 01/29/2017 07:29   Dg Chest Port 1 View  Result Date: 01/28/2017 CLINICAL DATA:  Follow-up bilateral pleural effusions with chest tube treatment. The patient underwent recent bariatric surgery.  EXAM: PORTABLE CHEST 1 VIEW COMPARISON:  Portable chest x-ray of January 27, 2017 FINDINGS: The lungs are well-expanded. Pleural fluid layer sposteriorly and laterally, bilaterally. The small caliber pigtail pleural drainage catheters are in stable position at the lung bases. The hemidiaphragms remain obscured. The cardiac silhouette is mildly enlarged. The central pulmonary vascularity is prominent. The interstitial markings remain increased bilaterally The PICC line tip projects over the proximal SVC. The bony thorax exhibits no acute abnormality. IMPRESSION: Stable appearance of the chest since yesterday's study. Persistent bilateral pleural effusions layering inferiorly and posteriorly. Mild cardiomegaly and central pulmonary vascular congestion with mild pulmonary interstitial edema. Electronically Signed   By: David  [place] M.D.   On: 01/28/2017 07:36   Dg Chest Port 1 View  Result Date: 01/27/2017 CLINICAL DATA:  Pleural effusions EXAM: PORTABLE CHEST 1 VIEW COMPARISON:  01/26/2017 FINDINGS: Pigtail catheters project over each lung base with hazy adjacent opacities presumably due to layering effusions and adjacent atelectasis. No pneumothorax is identified. Right-sided PICC line tip is seen in the mid SVC. Heart is borderline enlarged with mild vascular congestion. No acute nor suspicious osseous abnormalities. IMPRESSION: 1. New pigtail catheters are seen at each lung base superimposed on presumed layering pleural effusions and atelectasis. 2. No pneumothorax. 3. Stable cardiomegaly. Electronically Signed   By: Tollie Eth M.D.   On: 01/27/2017 19:54   Ct Image Guided Drainage By Percutaneous Catheter  Result Date: 01/27/2017 INDICATION: 41 year old male with a history of gastric bypass surgery and multiple intra-abdominal fluid collections and exited infusion of the left chest. EXAM: CT-GUIDED DRAIN  OF RIGHT PERI HEPATIC/SUBCAPSULAR FLUID. CT-GUIDED LEFT CHEST TUBE PLACEMENT MEDICATIONS: The  patient is currently admitted to the hospital and receiving intravenous antibiotics. The antibiotics were administered within an appropriate time frame prior to the initiation of the procedure. ANESTHESIA/SEDATION: 3.0 mg IV Versed 200 mcg IV Fentanyl Moderate Sedation Time:  31 minutes The patient was continuously monitored during the procedure by the interventional radiology nurse under my direct supervision. COMPLICATIONS: None TECHNIQUE: Informed written consent was obtained from the patient after a thorough discussion of the procedural risks, benefits and alternatives. All questions were addressed. Maximal Sterile Barrier Technique was utilized including caps, mask, sterile gowns, sterile gloves, sterile drape, hand hygiene and skin antiseptic. A timeout was performed prior to the initiation of the procedure. PROCEDURE: Patient is positioned supine position on the CT gantry table. Scout CT image of the upper abdomen and lower chest performed for planning purposes. The right upper abdomen was addressed. The right upper abdomen was prepped with chlorhexidine in a sterile fashion, and a sterile drape was applied covering the operative field. A sterile gown and sterile gloves were used for the procedure. Local anesthesia was provided with 1% Lidocaine. Once the patient is prepped and draped in the usual sterile fashion, the skin and subcutaneous tissues were generously infiltrated 1% lidocaine for local anesthesia. Using a trocar needle, the fluid at the right peripheral liver was approach. Once we confirmed the needle tip within the fluid, modified Seldinger technique was used to place a 12 [place] drain. Drain was sutured in position and attached to gravity drainage. The left chest was then addressed. The lower left chest was prepped with chlorhexidine in a sterile fashion common sterile drape was applied covering the operative field. Sterile gown and gloves were used. Local anesthesia provided with 1% lidocaine.  Once the patient was prepped and draped in the usual sterile fashion, the skin and subcutaneous tissues were generously infiltrated 1% lidocaine for local anesthesia. Using CT guidance, trocar needle was advanced into the fluid of the left pleural space. Using modified Seldinger technique, a 10 French drain was placed. This was attached to water seal chamber. Culture from the abdomen and the chest were sent to the lab. Final CT image was acquired. Patient tolerated the procedure well and remained hemodynamically stable throughout. No complications were encountered and no significant blood loss. FINDINGS: Initial CT image demonstrates perihepatic fluid collection of the right aspect of the liver, likely subcapsular given the configuration. Unchanged from prior. Drainage catheter terminates in the midline adjacent to the surgical changes of gastric bypass, similar prior. Small fluid adjacent to the spleen. Unchanged appearance of left pleural fluid. Status post drainage of right upper abdomen and left chest fluid, decreased size of the fluid collection of the right abdomen, with unchanged size of fluid collection in the chest. Paragraphs sample was sent for me age fluid location. IMPRESSION: Status post CT-guided 12 French drain placement of the right upper abdomen, and CT-guided left thoracostomy tube with 10 French drain placed. Sample was sent to the lab for culture from each location. Signed, Yvone NeuJaime S. Loreta AveWagner, DO [facility] Electronically Signed   By: Gilmer MorJaime  Wagner D.O.   On: 01/27/2017 15:32   Ct Image Guided Drainage By Percutaneous Catheter  Result Date: 01/27/2017 INDICATION: 41 year old male with a history of gastric bypass surgery and multiple intra-abdominal fluid collections and exited infusion of the left chest. EXAM: CT-GUIDED DRAIN OF RIGHT PERI HEPATIC/SUBCAPSULAR FLUID. CT-GUIDED LEFT CHEST TUBE PLACEMENT MEDICATIONS:  The patient is  currently admitted to the hospital and receiving intravenous antibiotics. The antibiotics were administered within an appropriate time frame prior to the initiation of the procedure. ANESTHESIA/SEDATION: 3.0 mg IV Versed 200 mcg IV Fentanyl Moderate Sedation Time:  31 minutes The patient was continuously monitored during the procedure by the interventional radiology nurse under my direct supervision. COMPLICATIONS: None TECHNIQUE: Informed written consent was obtained from the patient after a thorough discussion of the procedural risks, benefits and alternatives. All questions were addressed. Maximal Sterile Barrier Technique was utilized including caps, mask, sterile gowns, sterile gloves, sterile drape, hand hygiene and skin antiseptic. A timeout was performed prior to the initiation of the procedure. PROCEDURE: Patient is positioned supine position on the CT gantry table. Scout CT image of the upper abdomen and lower chest performed for planning purposes. The right upper abdomen was addressed. The right upper abdomen was prepped with chlorhexidine in a sterile fashion, and a sterile drape was applied covering the operative field. A sterile gown and sterile gloves were used for the procedure. Local anesthesia was provided with 1% Lidocaine. Once the patient is prepped and draped in the usual sterile fashion, the skin and subcutaneous tissues were generously infiltrated 1% lidocaine for local anesthesia. Using a trocar needle, the fluid at the right peripheral liver was approach. Once we confirmed the needle tip within the fluid, modified Seldinger technique was used to place a 12 [place] drain. Drain was sutured in position and attached to gravity drainage. The left chest was then addressed. The lower left chest was prepped with chlorhexidine in a sterile fashion common sterile drape was applied covering the operative field. Sterile gown and gloves were used. Local anesthesia provided with 1% lidocaine. Once the  patient was prepped and draped in the usual sterile fashion, the skin and subcutaneous tissues were generously infiltrated 1% lidocaine for local anesthesia. Using CT guidance, trocar needle was advanced into the fluid of the left pleural space. Using modified Seldinger technique, a 10 French drain was placed. This was attached to water seal chamber. Culture from the abdomen and the chest were sent to the lab. Final CT image was acquired. Patient tolerated the procedure well and remained hemodynamically stable throughout. No complications were encountered and no significant blood loss. FINDINGS: Initial CT image demonstrates perihepatic fluid collection of the right aspect of the liver, likely subcapsular given the configuration. Unchanged from prior. Drainage catheter terminates in the midline adjacent to the surgical changes of gastric bypass, similar prior. Small fluid adjacent to the spleen. Unchanged appearance of left pleural fluid. Status post drainage of right upper abdomen and left chest fluid, decreased size of the fluid collection of the right abdomen, with unchanged size of fluid collection in the chest. Paragraphs sample was sent for me age fluid location. IMPRESSION: Status post CT-guided 12 French drain placement of the right upper abdomen, and CT-guided left thoracostomy tube with 10 French drain placed. Sample was sent to the lab for culture from each location. Signed, Yvone Neu. Loreta Ave, DO [facility] Electronically Signed   By: Gilmer Mor D.O.   On: 01/27/2017 15:32    Anti-infectives: Anti-infectives    Start     Dose/Rate Route Frequency Ordered Stop   01/27/17 1900  ertapenem (INVANZ) 1 g in sodium chloride 0.9 % 50 mL IVPB     1 g 100 mL/hr over 30 Minutes Intravenous Every 24 hours 01/27/17 1836     01/23/17 1500  metroNIDAZOLE (  FLAGYL) IVPB 500 mg     500 mg 100 mL/hr over 60 Minutes Intravenous Every 8 hours 01/23/17  1353     01/23/17 1500  ceFEPIme (MAXIPIME) 2 g in dextrose 5 % 50 mL IVPB  Status:  Discontinued     2 g 100 mL/hr over 30 Minutes Intravenous Every 8 hours 01/23/17 1357 01/27/17 1835   01/22/17 2000  vancomycin (VANCOCIN) 1,250 mg in sodium chloride 0.9 % 250 mL IVPB  Status:  Discontinued     1,250 mg 166.7 mL/hr over 90 Minutes Intravenous Every 8 hours 01/22/17 1202 01/23/17 1358   01/22/17 1230  vancomycin (VANCOCIN) 2,500 mg in sodium chloride 0.9 % 500 mL IVPB     2,500 mg 250 mL/hr over 120 Minutes Intravenous  Once 01/22/17 1147 01/22/17 1438   01/15/17 1815  piperacillin-tazobactam (ZOSYN) IVPB 3.375 g  Status:  Discontinued     3.375 g 12.5 mL/hr over 240 Minutes Intravenous Every 8 hours 01/15/17 1804 01/15/17 1804   01/15/17 1630  piperacillin-tazobactam (ZOSYN) IVPB 3.375 g  Status:  Discontinued     3.375 g 12.5 mL/hr over 240 Minutes Intravenous Every 8 hours 01/15/17 1616 01/23/17 1354      Assessment/Plan: Patient Active Problem List   Diagnosis Date Noted  . Acute pulmonary edema (HCC)   . Peritonitis (HCC)   . Gastrointestinal anastomotic leak   . Pleural effusion   . Postprocedural intraabdominal abscess 01/15/2017  . DM (diabetes mellitus) (HCC) 02/29/2016  . HLD (hyperlipidemia) 02/29/2016  . Cellulitis and abscess of neck 02/29/2016  . Cellulitis 12/14/2013  . Morbid obesity (HCC) 12/14/2013  . Abscess 12/14/2013  . Hidradenitis suppurativa, neck and scalp 08/09/2012  Status post Roux-en-Y/gastric bypass,12/08/16, Dr. Clent Ridges Leak at gastrojejunostomy with multiple abscesses.  S/P perc drain 01/16/17 S/p IR drain 01/21/17 - 100 cc purulent fluid drained in the IR suite S/p IR thoracentesis 01/23/17 =>> 25 cc  S/p IR Perc drain R perihepatic abscess 9/11 S/p IR perc drain L chest exudative effusion 9/11  Stable today with no new findings or complaints.  DM HLD Morbid Obesity Body mass index:47.6 OSA with hx of CPAP FEN - on clear liquids,  TPN VTE - SCDs, lovenox ID - IV Zosyn 8/30-01/23/17 Vancomycin 9/6-01/23/17 Flagyl/cefipime 9/7 =>>4 days; changed to invanz/flagyl per ID 9/11 --> Day3  Plan: Continue current plan of care; follow cultures; possible thrombolytic instillation via CT - decision to be made by pulm    LOS: 14 days    Ashley Bultema A. 01/29/2017

## 2017-01-29 NOTE — Progress Notes (Signed)
1905 Chest tube 3 way opened and placed back to 20 cm suction.

## 2017-01-29 NOTE — Progress Notes (Addendum)
PHARMACY - ADULT TOTAL PARENTERAL NUTRITION CONSULT NOTE   Pharmacy Consult:  TPN Indication:  Prolonged NPO  Patient Measurements: Height: '5\' 10"'  (177.8 cm) Weight: (!) 340 lb 12.8 oz (154.6 kg) IBW/kg (Calculated) : 73 TPN AdjBW (KG): 93.4 Body mass index is 48.9 kg/m.  Assessment:  41 year old obese male s/p laparoscopic Roux-en-Y/gastric bypass surgery on 12/08/16 and hospitalization 8/14 through 01/10/17 for anasarca, abdominal pain, fever, and postoperative seroma with percutaneous drain placement now re-admitted with continued abdominal pain, occasional nausea, non-bloddy emesis, weight loss (on Lasix), and decreased appetite. CT revealed multiple large gas containing intra-abdominal abscesses concerning for dehiscence. Patient had new pleural drain and abdominal drain placed on 9/11.  GI: anasarca/vol overload with perc drain placed 8/31, 9/5, 9/11. Drain output decreasing 1L > 200 mL yesterday.  Prealbumin increasing at 11.1, LBM 9/10.  Pepcid in TPN, PRN Zofran, Anusol-HC for hemorrhoids Pt was tolerating premier protein prior to procedure 9/11, this was held, resumed yesterday post-op, now on CLD + small amount of premier protein shakes Endo: DM2 on metformin PTA - CBGs < 180 Insulin requirements in past 24 hrs: 12 units rSSI + 30 units in TPN Lytes: Phos 4.5, CoCa  9.7 (CaxPhos <55). Mg 1.5, Na 133, other lytes ok *days of TPN without lytes: 9/7, 9/10 Renal: SCr 0.46 stable, BUN WNL - UOP 0.9 ml/kg/hr, NS 35 ml/hr Pulm: effusion - s/p thoracentesis 9/7, plan for catheter drainage +/- thrombolytics. On 2L [place] (CPAP at home), Cards: HLD - VSS Hepatobil:  LFTs/Tbili / TG WNL.  Alk Phos trended down to wnl.  Neuro: pain score 5-9, Robaxin Q8H, PRN Oxycodone/Dilaudid.  Patient appears comfortable. ID: Ertapenem/flagyl for C.freundii peritonitis/intra-abd abscess.  Rare gardnerella/Strep constellatus in abdominal abscess cx.  Afebrile, WBC WNL  Zosyn 8/30 >> 9/7 Vanc 9/6 >>  9/7 Cefepime 9/7 >> 9/11 Flagyl 9/7 >> 9 Ertapenem 9/11 >>  8/30 MRSA PCR - negative 8/30 BCx - negative 8/31 abd abscess cx - Gardnerella vaginalis, Strep constellatus 9/5 abscess cx - C.freundii (S cefepime, Cipro, Primaxin, Septra) - holding for anaerobes 9/7 pleural fluid cx - MGTD  Best Practices: Lovenox 40 BID TPN Access: double lumen PICC placed 01/16/17 TPN start date: 01/16/17  Nutritional Goals (per RD recommendation 8/31): 2300-2500 kCal and 115-130 gm protein per day   Current Nutrition:  Clinimix E 5/15 at 100 ml/hr + ILE at 20 ml/hr x 12 hrs = 2184 kCal and 120 g protein per day CLD  Premier protein 2 oz qid - receiving about 116 kcal and 20 g of protein  Plan:  - Reduce rate of Clinimix E 5/15 to 90 ml/hr + 20% ILE at 20 ml/hr x 12 hrs   - TPN + ILE will provide 2013 kCal and 108 g of protein per day - TPN with lipids, together with premier protein, pt will meet 93 % of minimal kCal needs and 100% of protein goal  - Daily multivitamin and trace elements in TPN - Continue resistant SSI Q4H  - Continue insulin in TPN at 30 units  - Pepcid 40 mg daily in TPN  - F/u diet advancement - Magnesium 2 g IV x1 - Pt still with low caloric intake, only receiving < 1 protein shake/day and minimal CLD, anticipate weaning of TPN once caloric intake improves, f/u with MD 9/14 - Monitor duration of abx, MD managing    Hughes Better, PharmD, BCPS Clinical Pharmacist 01/29/2017 10:48 AM

## 2017-01-30 ENCOUNTER — Inpatient Hospital Stay (HOSPITAL_COMMUNITY): Payer: 59

## 2017-01-30 DIAGNOSIS — J918 Pleural effusion in other conditions classified elsewhere: Secondary | ICD-10-CM

## 2017-01-30 DIAGNOSIS — R0600 Dyspnea, unspecified: Secondary | ICD-10-CM

## 2017-01-30 LAB — GLUCOSE, CAPILLARY
GLUCOSE-CAPILLARY: 129 mg/dL — AB (ref 65–99)
GLUCOSE-CAPILLARY: 136 mg/dL — AB (ref 65–99)
Glucose-Capillary: 114 mg/dL — ABNORMAL HIGH (ref 65–99)
Glucose-Capillary: 136 mg/dL — ABNORMAL HIGH (ref 65–99)
Glucose-Capillary: 137 mg/dL — ABNORMAL HIGH (ref 65–99)
Glucose-Capillary: 91 mg/dL (ref 65–99)

## 2017-01-30 LAB — HEMOGLOBIN A1C
HEMOGLOBIN A1C: 5.7 % — AB (ref 4.8–5.6)
Mean Plasma Glucose: 116.89 mg/dL

## 2017-01-30 LAB — ECHOCARDIOGRAM COMPLETE
Height: 70 in
Weight: 5334.4 oz

## 2017-01-30 MED ORDER — METHOCARBAMOL 500 MG PO TABS
500.0000 mg | ORAL_TABLET | Freq: Three times a day (TID) | ORAL | Status: DC
  Administered 2017-01-30 – 2017-02-03 (×13): 500 mg via ORAL
  Filled 2017-01-30 (×13): qty 1

## 2017-01-30 MED ORDER — PERFLUTREN LIPID MICROSPHERE
1.0000 mL | INTRAVENOUS | Status: AC | PRN
  Administered 2017-01-30: 2 mL via INTRAVENOUS
  Filled 2017-01-30: qty 10

## 2017-01-30 MED ORDER — INSULIN ASPART 100 UNIT/ML ~~LOC~~ SOLN: 0.0000 [IU] | Freq: Three times a day (TID) | SUBCUTANEOUS | Status: DC

## 2017-01-30 MED ORDER — HYDROMORPHONE HCL 1 MG/ML IJ SOLN
1.0000 mg | Freq: Four times a day (QID) | INTRAMUSCULAR | Status: DC | PRN
  Administered 2017-01-30 (×2): 1 mg via INTRAVENOUS
  Filled 2017-01-30 (×2): qty 1

## 2017-01-30 MED ORDER — MAGNESIUM SULFATE 2 GM/50ML IV SOLN
2.0000 g | Freq: Once | INTRAVENOUS | Status: AC
  Administered 2017-01-30: 2 g via INTRAVENOUS
  Filled 2017-01-30: qty 50

## 2017-01-30 MED ORDER — HYDROMORPHONE HCL 1 MG/ML IJ SOLN
1.0000 mg | INTRAMUSCULAR | Status: DC | PRN
  Administered 2017-01-30 – 2017-02-01 (×10): 1 mg via INTRAVENOUS
  Filled 2017-01-30 (×11): qty 1

## 2017-01-30 NOTE — Progress Notes (Signed)
PULMONARY / CRITICAL CARE MEDICINE   Name: Albert White MRN: [id number] DOB: [birth year]    ADMISSION DATE:  01/15/2017 CONSULTATION DATE:  01/25/2017  REFERRING MD:  Dr. Dorris Fetch  CHIEF COMPLAINT: Left  Pleural effusion  Brief patient summary:  41 yo male had roux en y surgery at [facility] July 23.  He developed scrotal swelling and pain.  Found to have hydrocele and varicocele.  He was then found to have abdominal wall hematoma, fever 103F, and seroma.  He had CT abd which showed abscess.  He was admitted to [facility] and seen by surgery.  He had drain placed by IR and started on Abx.  He was noted to have b/l pleural effusions.  He had attempted thoracentesis with only 25 ml of fluid able to be removed.  He was seen by thoracic surgery, who requested a PCCM consult.  SUBJECTIVE/OVERNIGHT/INTERVAL HX:  S/p 2 doses of pleural lytics w/1450 ml serous drainage from left CT C/o of pain around CT site  VITAL SIGNS: BP 121/78 (BP Location: Left Arm)   Pulse 93   Temp 98.4 F (36.9 C) (Oral)   Resp 19   Ht  (1.778 m)   Wt (!) 333 lb 6.4 oz (151.2 kg)   SpO2 98%   BMI 47.84 kg/m   INTAKE / OUTPUT: I/O last 3 completed shifts: In: 5802.8 [P.O.:1680; I.V.:3242.8; Other:30; IV Piggyback:850] Out: 6017 [Urine:4460; Drains:1557]  PHYSICAL EXAMINATION: General:  Adult male sitting in chair in NAD HEENT: MM pink/moist Neuro: AO x3, non-focal CV:  rrr PULM: even/non-labored, lungs bilaterally clear, diminished in left base, left pigtail CT to 20 cm sxn, no air leak, on room air VZ:DGLO/ obese, bs active, abd drains x3  Extremities: warm/dry, BLE edema  Skin: no rashes   LABS:  BMET  Recent Labs Lab 01/26/17 0433 01/27/17 0551 01/29/17 0405  NA 133* 133* 133*  K 4.0 4.0 4.0  CL 98* 98* 98*  CO2 BUN CREATININE 0.48* 0.48* 0.46*  GLUCOSE 137* 158* 131*    Electrolytes  Recent Labs Lab 01/26/17 0433 01/27/17 0551 01/29/17 0405   CALCIUM 8.3* 8.1* 8.2*  MG 1.7 1.7 1.5*  PHOS 4.7* 4.4 4.5    CBC  Recent Labs Lab 01/25/17 0458 01/26/17 0433 01/27/17 0551  WBC 4.7 5.5 5.4  HGB 8.3* 8.2* 8.2*  HCT 27.9* 27.9* 27.3*  PLT 418* 432* 444*    Coag's No results for input(s): APTT, INR in the last 168 hours.  Sepsis Markers No results for input(s): LATICACIDVEN, PROCALCITON, O2SATVEN in the last 168 hours.  ABG No results for input(s): PHART, PCO2ART, PO2ART in the last 168 hours.  Liver Enzymes  Recent Labs Lab 01/26/17 0433 01/27/17 1937 01/29/17 0405  AST 18  --  17  ALT 15*  --  10*  ALKPHOS 116  --  83  BILITOT 0.4  --  0.2*  ALBUMIN 1.9* 1.7* 1.8*    Cardiac Enzymes No results for input(s): TROPONINI, PROBNP in the last 168 hours.  Glucose  Recent Labs Lab 01/29/17 1611 01/29/17 2011 01/30/17 0010 01/30/17 0425 01/30/17 0734 01/30/17 1209  GLUCAP 131* 99 137* 129* 136* 114*    Imaging Dg Chest Port 1 View  Result Date: 01/30/2017 CLINICAL DATA:  Pleural effusion.  Chest tubes. EXAM: PORTABLE CHEST 1 VIEW COMPARISON:  01/29/2017 . FINDINGS: Bilateral chest tubes in stable position. Right PICC line stable position. Cardiomegaly with diffuse bilateral pulmonary infiltrates/edema and  bilateral pleural effusions. No pneumothorax. IMPRESSION: 1. Bilateral chest tubes and right PICC line in stable position. No pneumothorax. 2. Cardiomegaly with diffuse bilateral pulmonary infiltrates/ edema and bilateral pleural effusions. Findings consistent CHF. No interim change from prior exam. Electronically Signed   By: Maisie Fus  Register   On: 01/30/2017 07:15    STUDIES:  Lt pleural fluid 9/07 >> glucose 130, protein 3.8, LDH 453, WBC 3750 (54 N, 43 L) CT chest 9/08 >> lower lobe consolidation b/l, mod Lt effusion, small Rt effusion CT chest 9/14 >>>  CULTURES: Blood 8/30 >> neg Abdominal abscess 8/31 >> Streptococcus constellatus, gardnerella vaginalis Abdominal abscess 9/05 >> citrobacter  freundii, viridans streptococcus Lt pleural fluid 9/07 >> neg Left pleural GS 9/07 >> neg Abdominal abscess 9/11 >> Left pleural fluid 9/11 >> Left pleural GS 9/11>>  ANTIBIOTICS: Zosyn 8/30 >> 9/05 Vancomycin 9/05 >> 9/06 Cefepime 9/07 >>9/11 Flagyl 9/07 >>  Ertapenem 9/11 >>  SIGNIFICANT EVENTS: 8/31 Admit, surgery consulted, abscess drain by IR 9/04 2nd drain by IR 9/07 Lt thoracentesis- 25 ml out 9/09 Pulmonary consulted  9/11 IR pig tail placement / Additional abdominal drain  LINES/TUBES: Rt PICC 8/31 >> RLQ drain 8/31 >> Peri-epigastric drain 9/5 >> IR PERC Left Pleural Pigtail CT 9/11 >> IR PERC Right abd perihepatic drain 9/11 >>  ASSESSMENT / PLAN:  Exudative Lt pleural effusion- moderate, loculated -s/p IR guided pig tail 9/11 w/ serosanguinous to now serous drainage.  Initial out  P:  D/c pleural lytics Repeat CT chest w/o contrast today Continue Cook catheter flush order set Encourage mobilization/ pulmonary hygiene Follow pleural studies Ensure adequate pain control Titrate oxygen to Maintain oxygen saturations> 94%  Goal neg I/O balance  Posey Boyer, AGACNP-BC [facility] Pgr: [phone] or if no answer [phone] 01/30/2017, 2:58 PM

## 2017-01-30 NOTE — Progress Notes (Signed)
Brief Nutrition Follow-Up Note  Paged by pharmacist Enrique Sack), who informed this RD that TPN is being d/c today. Pt remains on a bariatric clear liquid diet with Premier Protein shakes.  Paged surgery PA Nehemiah Settle). Case discussed and she confirmed plan to d/c TPN, with pt to remains on bariatric clear liquids with Premier Protein shakes. Per PA, pt is consuming about 4 protein shakes daily and suspects nutrition will be adequate for pt (4 Premier Protein shakes will provide 640 kcals and 120 grams protein, meeting 28% of estimated kcal needs and 100% of protein needs). She also mention pt still with leak. RD discussed potential for diet advancement with PA (bariatric full liquid) in order to optimize nutritional status and food choices, now that pt is no longer receive TPN support. PA reports she will discuss with MD.   RD will continue to follow.   Sianne Tejada A. Mayford Knife, RD, LDN, CDE Pager: [phone] After hours Pager: [phone]

## 2017-01-30 NOTE — Progress Notes (Signed)
  Echocardiogram 2D Echocardiogram has been performed.  Robert Sunga 01/30/2017, 10:25 AM

## 2017-01-30 NOTE — Progress Notes (Signed)
Patient refuses CPAP. RT will continue to monitor throughout the night as needed.

## 2017-01-30 NOTE — Progress Notes (Signed)
INFECTIOUS DISEASE PROGRESS NOTE  ID: Albert White is a 41 y.o. male with  Active Problems:   Postprocedural intraabdominal abscess   Pleural effusion on left   Peritonitis (HCC)   Gastrointestinal anastomotic leak   Acute pulmonary edema (HCC)   Hypomagnesemia  Subjective: Continued abd pain.   Abtx:  Anti-infectives    Start     Dose/Rate Route Frequency Ordered Stop   01/27/17 1900  ertapenem (INVANZ) 1 g in sodium chloride 0.9 % 50 mL IVPB     1 g 100 mL/hr over 30 Minutes Intravenous Every 24 hours 01/27/17 1836     01/23/17 1500  metroNIDAZOLE (FLAGYL) IVPB 500 mg     500 mg 100 mL/hr over 60 Minutes Intravenous Every 8 hours 01/23/17 1353     01/23/17 1500  ceFEPIme (MAXIPIME) 2 g in dextrose 5 % 50 mL IVPB  Status:  Discontinued     2 g 100 mL/hr over 30 Minutes Intravenous Every 8 hours 01/23/17 1357 01/27/17 1835   01/22/17 2000  vancomycin (VANCOCIN) 1,250 mg in sodium chloride 0.9 % 250 mL IVPB  Status:  Discontinued     1,250 mg 166.7 mL/hr over 90 Minutes Intravenous Every 8 hours 01/22/17 1202 01/23/17 1358   01/22/17 1230  vancomycin (VANCOCIN) 2,500 mg in sodium chloride 0.9 % 500 mL IVPB     2,500 mg 250 mL/hr over 120 Minutes Intravenous  Once 01/22/17 1147 01/22/17 1438   01/15/17 1815  piperacillin-tazobactam (ZOSYN) IVPB 3.375 g  Status:  Discontinued     3.375 g 12.5 mL/hr over 240 Minutes Intravenous Every 8 hours 01/15/17 1804 01/15/17 1804   01/15/17 1630  piperacillin-tazobactam (ZOSYN) IVPB 3.375 g  Status:  Discontinued     3.375 g 12.5 mL/hr over 240 Minutes Intravenous Every 8 hours 01/15/17 1616 01/23/17 1354      Medications:  Scheduled: . acetaminophen  1,000 mg Oral Q8H  . alteplase (TPA) for intrapleural administration  10 mg Intrapleural Q12H   And  . pulmozyme (DORNASE) for intrapleural administration  5 mg Intrapleural Q12H  . chlorhexidine  15 mL Mouth Rinse BID  . Chlorhexidine Gluconate Cloth  6 each Topical Daily  .  enoxaparin (LOVENOX) injection  40 mg Subcutaneous Q12H  . hydrocortisone   Rectal BID  . insulin aspart  0-20 Units Subcutaneous Q4H  . mouth rinse  15 mL Mouth Rinse q12n4p  . methocarbamol  500 mg Oral TID  . pneumococcal 23 valent vaccine  0.5 mL Intramuscular Tomorrow-1000  . polyethylene glycol  17 g Oral Daily  . protein supplement shake  2 oz Oral QID  . sodium chloride flush  5 mL Intravenous Q8H  . sodium chloride flush  5 mL Intravenous Q8H  . sodium chloride flush  5 mL Intravenous Q8H    Objective: Vital signs in last 24 hours: Temp:  [98 F (36.7 C)-98.4 F (36.9 C)] 98.4 F (36.9 C) (09/14 1326) Pulse Rate:  [93-99] 93 (09/14 1326) Resp:  [18-19] 19 (09/14 1326) BP: (121-140)/(78-92) 121/78 (09/14 1326) SpO2:  [96 %-98 %] 98 % (09/14 1326) Weight:  [151.2 kg (333 lb 6.4 oz)] 151.2 kg (333 lb 6.4 oz) (09/14 0427)   General appearance: alert, cooperative and no distress Resp: clear to auscultation bilaterally Cardio: regular rate and rhythm GI: normal findings: bowel sounds normal and soft, non-tender  Lab Results  Recent Labs  01/29/17 0405  NA 133*  K 4.0  CL 98*  CO2 28  BUN 9  CREATININE 0.46*   Liver Panel  Recent Labs  01/27/17 1937 01/29/17 0405  PROT  --  6.0*  ALBUMIN 1.7* 1.8*  AST  --  17  ALT  --  10*  ALKPHOS  --  83  BILITOT  --  0.2*   Sedimentation Rate No results for input(s): ESRSEDRATE in the last 72 hours. C-Reactive Protein No results for input(s): CRP in the last 72 hours.  Microbiology: Recent Results (from the past 240 hour(s))  Aerobic/Anaerobic Culture (surgical/deep wound)     Status: None   Collection Time: 01/21/17 10:04 AM  Result Value Ref Range Status   Specimen Description ABSCESS  Final   Special Requests DRAIN POST CT GUIDED PLACEMENT  Final   Gram Stain   Final    ABUNDANT WBC PRESENT,BOTH PMN AND MONONUCLEAR ABUNDANT GRAM POSITIVE COCCI IN PAIRS ABUNDANT GRAM VARIABLE ROD    Culture   Final     ABUNDANT CITROBACTER FREUNDII FEW VIRIDANS STREPTOCOCCUS CALL MICROBIOLOGY LAB IF SENSITIVITIES ARE REQUIRED. ABUNDANT PREVOTELLA DENTICOLA BETA LACTAMASE POSITIVE    Report Status 01/25/2017 FINAL  Final   Organism ID, Bacteria CITROBACTER FREUNDII  Final      Susceptibility   Citrobacter freundii - MIC*    CEFAZOLIN >=64 RESISTANT Resistant     CEFEPIME <=1 SENSITIVE Sensitive     CEFTAZIDIME >=64 RESISTANT Resistant     CEFTRIAXONE 16 INTERMEDIATE Intermediate     CIPROFLOXACIN <=0.25 SENSITIVE Sensitive     GENTAMICIN <=1 SENSITIVE Sensitive     IMIPENEM <=0.25 SENSITIVE Sensitive     TRIMETH/SULFA <=20 SENSITIVE Sensitive     PIP/TAZO 64 INTERMEDIATE Intermediate     * ABUNDANT CITROBACTER FREUNDII  Gram stain     Status: None   Collection Time: 01/23/17 12:53 PM  Result Value Ref Range Status   Specimen Description PLEURAL LEFT  Final   Special Requests NONE  Final   Gram Stain NO WBC SEEN NO ORGANISMS SEEN   Final   Report Status 01/23/2017 FINAL  Final  Culture, body fluid-bottle     Status: None   Collection Time: 01/23/17 12:53 PM  Result Value Ref Range Status   Specimen Description PLEURAL LEFT  Final   Special Requests NONE  Final   Culture NO GROWTH 5 DAYS  Final   Report Status 01/28/2017 FINAL  Final  Aerobic/Anaerobic Culture (surgical/deep wound)     Status: None (Preliminary result)   Collection Time: 01/27/17  1:58 PM  Result Value Ref Range Status   Specimen Description ABSCESS PLEURAL LEFT  Final   Special Requests SYRINGE  Final   Gram Stain   Final    RARE WBC PRESENT,BOTH PMN AND MONONUCLEAR NO ORGANISMS SEEN    Culture   Final    NO GROWTH 3 DAYS NO ANAEROBES ISOLATED; CULTURE IN PROGRESS FOR 5 DAYS   Report Status PENDING  Incomplete  Aerobic/Anaerobic Culture (surgical/deep wound)     Status: None (Preliminary result)   Collection Time: 01/27/17  2:34 PM  Result Value Ref Range Status   Specimen Description ABDOMEN  Final   Special  Requests RIGHT PERIHEPATIC FLUID  Final   Gram Stain   Final    MODERATE WBC PRESENT,BOTH PMN AND MONONUCLEAR NO ORGANISMS SEEN    Culture   Final    NO GROWTH 3 DAYS NO ANAEROBES ISOLATED; CULTURE IN PROGRESS FOR 5 DAYS   Report Status PENDING  Incomplete    Studies/Results: Dg Chest Port 1 [facility]  Result Date: 01/30/2017 CLINICAL DATA:  Pleural effusion.  Chest tubes. EXAM: PORTABLE CHEST 1 VIEW COMPARISON:  01/29/2017 . FINDINGS: Bilateral chest tubes in stable position. Right PICC line stable position. Cardiomegaly with diffuse bilateral pulmonary infiltrates/edema and bilateral pleural effusions. No pneumothorax. IMPRESSION: 1. Bilateral chest tubes and right PICC line in stable position. No pneumothorax. 2. Cardiomegaly with diffuse bilateral pulmonary infiltrates/ edema and bilateral pleural effusions. Findings consistent CHF. No interim change from prior exam. Electronically Signed   By: Maisie Fus  Register   On: 01/30/2017 07:15   Dg Chest Port 1 View  Result Date: 01/29/2017 CLINICAL DATA:  Pleural effusion.  Chest tube. EXAM: PORTABLE CHEST 1 VIEW COMPARISON:  01/28/2017 . FINDINGS: Bilateral chest tubes again noted . right PICC line stable position. Cardiomegaly with bilateral pulmonary infiltrates most consistent pulmonary edema. Small bilateral pleural effusions again noted. IMPRESSION: 1. Bilateral chest tubes and right PICC line stable position. No pneumothorax. 2. Findings consistent congestive heart failure bilateral from interstitial edema bilateral pleural effusions again noted. No interim change . Electronically Signed   By: Maisie Fus  Register   On: 01/29/2017 07:29     Assessment/Plan: Abdominal abscesses  S/P Gastric surgery 12-08-16  DM2 x1 year  Morbid obesity  Total days of antibiotics: 15cefepime/flagyl ---> invanz/flagyl   Unfortunately unable to stop flagyl for gardnerella. (started 9-7) His Cx from 9-11 are no growth so far.  His A1C is only slightly  elevated Defer to primary team on repeating his CT scan. especially given his persistent pain.         Albert White Infectious Diseases (pager) [phone] [email or web address] 01/30/2017, 3:02 PM  LOS: 15 days

## 2017-01-30 NOTE — Progress Notes (Signed)
Alteplase 20 mg instilled intrapleurally in left pigtail CT at 0515 via 3 way stop-cock for loculated exudative pleural effusion and clamped.  Patient instructed to turn frequently side to side.    Then at 0630, Dornase alpha (Pulmozyme) 5 mg instilled via 3 way stopcock and clamped/ off to drainage.  Patient again, instructed to turn side to side frequently over the next hour.    At 0730, RN to open 3 way and return CT to 20 cm of suction.  Joneen Roach, AGACNP-BC [facility] Pulmonology/Critical Care Pager [phone] or [phone]  01/30/2017 6:45 AM

## 2017-01-30 NOTE — Progress Notes (Signed)
      [address]       [place] [address]             [phone]      Sitting up in chair C/o pain from drains  BP 132/81 (BP Location: Left Arm)   Pulse 99   Temp 98.1 F (36.7 C) (Oral)   Resp 19   Ht  (1.778 m)   Wt (!) 333 lb 6.4 oz (151.2 kg)   SpO2 96%   BMI 47.84 kg/m   Intake/Output Summary (Last 24 hours) at 01/30/17 1326 Last data filed at 01/30/17 1016  Gross per 24 hour  Intake          4207.83 ml  Output             3857 ml  Net           350.83 ml   Reportedly 1150 ml from CT overnight and 300 ml so far today Has a new pleuravac in place with serous fluid in tube  Lungs diminished at both bases, equal bilaterally  IMPRESSION-  Good response to thrombolytics via pleural tube Dc chest drain when < 250 ml/ day  Viviann Spare C. Dorris Fetch, MD [facility] [phone]

## 2017-01-30 NOTE — Progress Notes (Signed)
Patient ID: Albert White, male   DOB: Aug 22, 1975, 41 y.o.   MRN: [id number]  [facility] Surgery Progress Note     Subjective: CC- abdominal pain Overall feeling well this morning. Abdominal pain well controlled. He is tolerating clear liquids and protein shakes. Denies n/v. BM yesterday. Ambulating well.  Patient undergoing thrombolytic instillation via CT this AM.  Objective: Vital signs in last 24 hours: Temp:  [97.7 F (36.5 C)-98.1 F (36.7 C)] 98.1 F (36.7 C) (09/14 0427) Pulse Rate:  [98-108] 99 (09/14 0427) Resp:  [18-20] 19 (09/14 0427) BP: (127-140)/(81-92) 132/81 (09/14 0427) SpO2:  [96 %-98 %] 96 % (09/14 0427) Weight:  [333 lb 6.4 oz (151.2 kg)] 333 lb 6.4 oz (151.2 kg) (09/14 0427) Last BM Date: 01/27/17  Intake/Output from previous day: 09/13 0701 - 09/14 0700 In: 4562.8 [P.O.:1080; I.V.:2822.8; IV Piggyback:630] Out: 3757 [Urine:2400; Drains:1357] Intake/Output this shift: No intake/output data recorded.  PE: Gen:  Alert, NAD HEENT: EOM's intact, pupils equal and round Card:  RRR, no M/G/R heard Pulm:  effort normal Abd: obese, soft, ND, tender around drains x3 +BS Ext:  No erythema. Mild BLE edema Psych: A&Ox3  Skin: no rashes noted, warm and dry  Lab Results:  No results for input(s): WBC, HGB, HCT, PLT in the last 72 hours. BMET  Recent Labs  01/29/17 0405  NA 133*  K 4.0  CL 98*  CO2 28  GLUCOSE 131*  BUN 9  CREATININE 0.46*  CALCIUM 8.2*   PT/INR No results for input(s): LABPROT, INR in the last 72 hours. CMP     Component Value Date/Time   NA 133 (L) 01/29/2017 0405   K 4.0 01/29/2017 0405   CL 98 (L) 01/29/2017 0405   CO2 28 01/29/2017 0405   GLUCOSE 131 (H) 01/29/2017 0405   BUN 9 01/29/2017 0405   CREATININE 0.46 (L) 01/29/2017 0405   CALCIUM 8.2 (L) 01/29/2017 0405   PROT 6.0 (L) 01/29/2017 0405   ALBUMIN 1.8 (L) 01/29/2017 0405   AST 17 01/29/2017 0405   ALT 10 (L) 01/29/2017 0405   ALKPHOS 83 01/29/2017 0405    BILITOT 0.2 (L) 01/29/2017 0405   GFRNONAA >60 01/29/2017 0405   GFRAA >60 01/29/2017 0405   Lipase  No results found for: LIPASE     Studies/Results: Dg Chest Port 1 View  Result Date: 01/30/2017 CLINICAL DATA:  Pleural effusion.  Chest tubes. EXAM: PORTABLE CHEST 1 VIEW COMPARISON:  01/29/2017 . FINDINGS: Bilateral chest tubes in stable position. Right PICC line stable position. Cardiomegaly with diffuse bilateral pulmonary infiltrates/edema and bilateral pleural effusions. No pneumothorax. IMPRESSION: 1. Bilateral chest tubes and right PICC line in stable position. No pneumothorax. 2. Cardiomegaly with diffuse bilateral pulmonary infiltrates/ edema and bilateral pleural effusions. Findings consistent CHF. No interim change from prior exam. Electronically Signed   By: Maisie Fus  Register   On: 01/30/2017 07:15   Dg Chest Port 1 View  Result Date: 01/29/2017 CLINICAL DATA:  Pleural effusion.  Chest tube. EXAM: PORTABLE CHEST 1 VIEW COMPARISON:  01/28/2017 . FINDINGS: Bilateral chest tubes again noted . right PICC line stable position. Cardiomegaly with bilateral pulmonary infiltrates most consistent pulmonary edema. Small bilateral pleural effusions again noted. IMPRESSION: 1. Bilateral chest tubes and right PICC line stable position. No pneumothorax. 2. Findings consistent congestive heart failure bilateral from interstitial edema bilateral pleural effusions again noted. No interim change . Electronically Signed   By: Maisie Fus  Register   On: 01/29/2017 07:29    Anti-infectives:  Anti-infectives    Start     Dose/Rate Route Frequency Ordered Stop   01/27/17 1900  ertapenem (INVANZ) 1 g in sodium chloride 0.9 % 50 mL IVPB     1 g 100 mL/hr over 30 Minutes Intravenous Every 24 hours 01/27/17 1836     01/23/17 1500  metroNIDAZOLE (FLAGYL) IVPB 500 mg     500 mg 100 mL/hr over 60 Minutes Intravenous Every 8 hours 01/23/17 1353     01/23/17 1500  ceFEPIme (MAXIPIME) 2 g in dextrose 5 % 50 mL  IVPB  Status:  Discontinued     2 g 100 mL/hr over 30 Minutes Intravenous Every 8 hours 01/23/17 1357 01/27/17 1835   01/22/17 2000  vancomycin (VANCOCIN) 1,250 mg in sodium chloride 0.9 % 250 mL IVPB  Status:  Discontinued     1,250 mg 166.7 mL/hr over 90 Minutes Intravenous Every 8 hours 01/22/17 1202 01/23/17 1358   01/22/17 1230  vancomycin (VANCOCIN) 2,500 mg in sodium chloride 0.9 % 500 mL IVPB     2,500 mg 250 mL/hr over 120 Minutes Intravenous  Once 01/22/17 1147 01/22/17 1438   01/15/17 1815  piperacillin-tazobactam (ZOSYN) IVPB 3.375 g  Status:  Discontinued     3.375 g 12.5 mL/hr over 240 Minutes Intravenous Every 8 hours 01/15/17 1804 01/15/17 1804   01/15/17 1630  piperacillin-tazobactam (ZOSYN) IVPB 3.375 g  Status:  Discontinued     3.375 g 12.5 mL/hr over 240 Minutes Intravenous Every 8 hours 01/15/17 1616 01/23/17 1354       Assessment/Plan DM HLD Morbid Obesity Body mass index:47.6 OSA with hx of CPAP  Status post Roux-en-Y/gastric bypass,12/08/16, Dr. Clent Ridges Leak at gastrojejunostomy with multiple abscesses. -S/P perc drain 01/16/17 >> cx RARE GARDNERELLA VAGINALIS -S/p IR drain 01/21/17 >> cs ABUNDANT CITROBACTER FREUNDII, FEW VIRIDANS STREPTOCOCCUS -S/p IR thoracentesis 01/23/17 =>> cx no organisms -S/p IR Perc drain R perihepatic abscess 9/11 >> cx NGTD -S/p IR perc drain L chest exudative effusion 9/11 >> cx NGTD. S/p thrombolytic instillation via CT 9/13 and 9/14.  FEN - bariatric clear liquids VTE - SCDs, lovenox ID - Zosyn 8/30>>01/23/17; Vancomycin 9/6>>01/23/17; Cefepime 9/7>>01/27/17; Flagyl 9/7>>8 days; Invanz 9/11>> Day#4 Pain - scheduled robaxin, tylenol. PRN dilaudid and oxycodone.  Plan: D/c TNA and continue bariatric clears/protein shakes. Wean pain medication. Continue abdominal drains. Follow cultures. CT per CT surgery. From abdominal standpoint patient could be discharged with drains/antibiotics on current diet, and followup with Dr.  Sheliah Hatch. Appreciate ID recommendations on antibiotics.   LOS: 15 days    Franne Forts , [facility] Surgery 01/30/2017, 8:55 AM Pager: [phone] Consults: [phone] Mon-Fri 7:00 am-4:30 pm Sat-Sun 7:00 am-11:30 am

## 2017-01-30 NOTE — Progress Notes (Signed)
Patient ID: Albert White, male   DOB: 01-Jun-1975, 41 y.o.   MRN: [id number]    Referring Physician(s): Dr. Almond Lint  Supervising Physician: Irish Lack  Patient Status: [facility] - In-pt  Chief Complaint: Multiple abdominal fluid collections, left chest fluid collection  Subjective: Patient with no new complaints except more pain in chest right now after alteplase instilled into chest drain this morning.  Allergies: Patient has no known allergies.  Medications: Prior to Admission medications   Medication Sig Start Date End Date Taking? Authorizing Provider  acetaminophen (TYLENOL) 325 MG tablet Take 2 tablets (650 mg total) by mouth every 8 (eight) hours. Patient taking differently: Take 650 mg by mouth every 8 (eight) hours as needed for moderate pain or headache.  02/27/16  Yes Mesner, Barbara Cower, MD  atorvastatin (LIPITOR) 10 MG tablet Take 10 mg by mouth every evening.  09/18/15  Yes [provider]  calcium carbonate (TUMS - DOSED IN MG ELEMENTAL CALCIUM) 500 MG chewable tablet Chew 500 mg by mouth as needed for heartburn.   Yes [provider]  nystatin (MYCOSTATIN) 100000 UNIT/ML suspension Take by mouth as needed. Thrush. 1 tsp 11/27/16  Yes [provider]  omeprazole (PRILOSEC) 20 MG capsule Take 20 mg by mouth 2 (two) times daily. 11/27/16  Yes [provider]  OVER THE COUNTER MEDICATION Apply 1 application topically as needed (eczema). [employer] Eczema Cream    Yes [provider]  oxyCODONE (ROXICODONE) 5 MG immediate release tablet Take 1 tablet (5 mg total) by mouth every 6 (six) hours as needed for severe pain. 03/05/16  Yes Ghimire, Werner Lean, MD  traZODone (DESYREL) 100 MG tablet Take 100 mg by mouth at bedtime.   Yes [provider]  ursodiol (ACTIGALL) 300 MG capsule Take 300 mg by mouth 2 (two) times daily. 11/27/16  Yes [provider]  cephALEXin (KEFLEX) 500 MG capsule Take 1 capsule (500 mg total) by  mouth 3 (three) times daily. Patient not taking: Reported on 01/15/2017 03/05/16   Maretta Bees, MD  doxycycline (VIBRA-TABS) 100 MG tablet Take 1 tablet (100 mg total) by mouth 2 (two) times daily. Patient not taking: Reported on 01/15/2017 03/05/16   Maretta Bees, MD  metFORMIN (GLUCOPHAGE) 500 MG tablet Take 2 tablets (1,000 mg total) by mouth 2 (two) times daily with a meal. Patient not taking: Reported on 01/15/2017 03/05/16   Maretta Bees, MD    Vital Signs: BP 132/81 (BP Location: Left Arm)   Pulse 99   Temp 98.1 F (36.7 C) (Oral)   Resp 19   Ht  (1.778 m)   Wt (!) 333 lb 6.4 oz (151.2 kg)   SpO2 96%   BMI 47.84 kg/m   Physical Exam: Chest: left chest tube in place, cannister just emptied, over 1300cc out yesterday Abd: RLQ drain with cloudy serosang output, 110cc yesterday.  RUQ drain with 92cc yesterday and serous output.  Epigastric/LUQ drain with minimal output in drain.  All sites are c/d/i  Imaging: Dg Chest Port 1 View  Result Date: 01/30/2017 CLINICAL DATA:  Pleural effusion.  Chest tubes. EXAM: PORTABLE CHEST 1 VIEW COMPARISON:  01/29/2017 . FINDINGS: Bilateral chest tubes in stable position. Right PICC line stable position. Cardiomegaly with diffuse bilateral pulmonary infiltrates/edema and bilateral pleural effusions. No pneumothorax. IMPRESSION: 1. Bilateral chest tubes and right PICC line in stable position. No pneumothorax. 2. Cardiomegaly with diffuse bilateral pulmonary infiltrates/ edema and bilateral pleural effusions. Findings consistent CHF. No  interim change from prior exam. Electronically Signed   By: Maisie Fus  Register   On: 01/30/2017 07:15   Dg Chest Port 1 View  Result Date: 01/29/2017 CLINICAL DATA:  Pleural effusion.  Chest tube. EXAM: PORTABLE CHEST 1 VIEW COMPARISON:  01/28/2017 . FINDINGS: Bilateral chest tubes again noted . right PICC line stable position. Cardiomegaly with bilateral pulmonary infiltrates most consistent  pulmonary edema. Small bilateral pleural effusions again noted. IMPRESSION: 1. Bilateral chest tubes and right PICC line stable position. No pneumothorax. 2. Findings consistent congestive heart failure bilateral from interstitial edema bilateral pleural effusions again noted. No interim change . Electronically Signed   By: Maisie Fus  Register   On: 01/29/2017 07:29   Dg Chest Port 1 View  Result Date: 01/28/2017 CLINICAL DATA:  Follow-up bilateral pleural effusions with chest tube treatment. The patient underwent recent bariatric surgery. EXAM: PORTABLE CHEST 1 VIEW COMPARISON:  Portable chest x-ray of January 27, 2017 FINDINGS: The lungs are well-expanded. Pleural fluid layer sposteriorly and laterally, bilaterally. The small caliber pigtail pleural drainage catheters are in stable position at the lung bases. The hemidiaphragms remain obscured. The cardiac silhouette is mildly enlarged. The central pulmonary vascularity is prominent. The interstitial markings remain increased bilaterally The PICC line tip projects over the proximal SVC. The bony thorax exhibits no acute abnormality. IMPRESSION: Stable appearance of the chest since yesterday's study. Persistent bilateral pleural effusions layering inferiorly and posteriorly. Mild cardiomegaly and central pulmonary vascular congestion with mild pulmonary interstitial edema. Electronically Signed   By: David  [place] M.D.   On: 01/28/2017 07:36   Dg Chest Port 1 View  Result Date: 01/27/2017 CLINICAL DATA:  Pleural effusions EXAM: PORTABLE CHEST 1 VIEW COMPARISON:  01/26/2017 FINDINGS: Pigtail catheters project over each lung base with hazy adjacent opacities presumably due to layering effusions and adjacent atelectasis. No pneumothorax is identified. Right-sided PICC line tip is seen in the mid SVC. Heart is borderline enlarged with mild vascular congestion. No acute nor suspicious osseous abnormalities. IMPRESSION: 1. New pigtail catheters are seen at each  lung base superimposed on presumed layering pleural effusions and atelectasis. 2. No pneumothorax. 3. Stable cardiomegaly. Electronically Signed   By: Tollie Eth M.D.   On: 01/27/2017 19:54   Ct Image Guided Drainage By Percutaneous Catheter  Result Date: 01/27/2017 INDICATION: 41 year old male with a history of gastric bypass surgery and multiple intra-abdominal fluid collections and exited infusion of the left chest. EXAM: CT-GUIDED DRAIN OF RIGHT PERI HEPATIC/SUBCAPSULAR FLUID. CT-GUIDED LEFT CHEST TUBE PLACEMENT MEDICATIONS: The patient is currently admitted to the hospital and receiving intravenous antibiotics. The antibiotics were administered within an appropriate time frame prior to the initiation of the procedure. ANESTHESIA/SEDATION: 3.0 mg IV Versed 200 mcg IV Fentanyl Moderate Sedation Time:  31 minutes The patient was continuously monitored during the procedure by the interventional radiology nurse under my direct supervision. COMPLICATIONS: None TECHNIQUE: Informed written consent was obtained from the patient after a thorough discussion of the procedural risks, benefits and alternatives. All questions were addressed. Maximal Sterile Barrier Technique was utilized including caps, mask, sterile gowns, sterile gloves, sterile drape, hand hygiene and skin antiseptic. A timeout was performed prior to the initiation of the procedure. PROCEDURE: Patient is positioned supine position on the CT gantry table. Scout CT image of the upper abdomen and lower chest performed for planning purposes. The right upper abdomen was addressed. The right upper abdomen was prepped with chlorhexidine in a sterile fashion, and a sterile drape was applied covering the operative field.  A sterile gown and sterile gloves were used for the procedure. Local anesthesia was provided with 1% Lidocaine. Once the patient is prepped and draped in the usual sterile fashion, the skin and subcutaneous tissues were generously infiltrated  1% lidocaine for local anesthesia. Using a trocar needle, the fluid at the right peripheral liver was approach. Once we confirmed the needle tip within the fluid, modified Seldinger technique was used to place a 12 [place] drain. Drain was sutured in position and attached to gravity drainage. The left chest was then addressed. The lower left chest was prepped with chlorhexidine in a sterile fashion common sterile drape was applied covering the operative field. Sterile gown and gloves were used. Local anesthesia provided with 1% lidocaine. Once the patient was prepped and draped in the usual sterile fashion, the skin and subcutaneous tissues were generously infiltrated 1% lidocaine for local anesthesia. Using CT guidance, trocar needle was advanced into the fluid of the left pleural space. Using modified Seldinger technique, a 10 French drain was placed. This was attached to water seal chamber. Culture from the abdomen and the chest were sent to the lab. Final CT image was acquired. Patient tolerated the procedure well and remained hemodynamically stable throughout. No complications were encountered and no significant blood loss. FINDINGS: Initial CT image demonstrates perihepatic fluid collection of the right aspect of the liver, likely subcapsular given the configuration. Unchanged from prior. Drainage catheter terminates in the midline adjacent to the surgical changes of gastric bypass, similar prior. Small fluid adjacent to the spleen. Unchanged appearance of left pleural fluid. Status post drainage of right upper abdomen and left chest fluid, decreased size of the fluid collection of the right abdomen, with unchanged size of fluid collection in the chest. Paragraphs sample was sent for me age fluid location. IMPRESSION: Status post CT-guided 12 French drain placement of the right upper abdomen, and CT-guided left thoracostomy tube with 10 French drain placed. Sample was sent to the lab for culture from each  location. Signed, Yvone Neu. Loreta Ave, DO [facility] Electronically Signed   By: Gilmer Mor D.O.   On: 01/27/2017 15:32   Ct Image Guided Drainage By Percutaneous Catheter  Result Date: 01/27/2017 INDICATION: 41 year old male with a history of gastric bypass surgery and multiple intra-abdominal fluid collections and exited infusion of the left chest. EXAM: CT-GUIDED DRAIN OF RIGHT PERI HEPATIC/SUBCAPSULAR FLUID. CT-GUIDED LEFT CHEST TUBE PLACEMENT MEDICATIONS: The patient is currently admitted to the hospital and receiving intravenous antibiotics. The antibiotics were administered within an appropriate time frame prior to the initiation of the procedure. ANESTHESIA/SEDATION: 3.0 mg IV Versed 200 mcg IV Fentanyl Moderate Sedation Time:  31 minutes The patient was continuously monitored during the procedure by the interventional radiology nurse under my direct supervision. COMPLICATIONS: None TECHNIQUE: Informed written consent was obtained from the patient after a thorough discussion of the procedural risks, benefits and alternatives. All questions were addressed. Maximal Sterile Barrier Technique was utilized including caps, mask, sterile gowns, sterile gloves, sterile drape, hand hygiene and skin antiseptic. A timeout was performed prior to the initiation of the procedure. PROCEDURE: Patient is positioned supine position on the CT gantry table. Scout CT image of the upper abdomen and lower chest performed for planning purposes. The right upper abdomen was addressed. The right upper abdomen was prepped with chlorhexidine in a sterile fashion, and a sterile drape was applied covering the operative field. A sterile gown and sterile gloves were used for the procedure.  Local anesthesia was provided with 1% Lidocaine. Once the patient is prepped and draped in the usual sterile fashion, the skin and subcutaneous tissues were generously infiltrated 1% lidocaine  for local anesthesia. Using a trocar needle, the fluid at the right peripheral liver was approach. Once we confirmed the needle tip within the fluid, modified Seldinger technique was used to place a 12 [place] drain. Drain was sutured in position and attached to gravity drainage. The left chest was then addressed. The lower left chest was prepped with chlorhexidine in a sterile fashion common sterile drape was applied covering the operative field. Sterile gown and gloves were used. Local anesthesia provided with 1% lidocaine. Once the patient was prepped and draped in the usual sterile fashion, the skin and subcutaneous tissues were generously infiltrated 1% lidocaine for local anesthesia. Using CT guidance, trocar needle was advanced into the fluid of the left pleural space. Using modified Seldinger technique, a 10 French drain was placed. This was attached to water seal chamber. Culture from the abdomen and the chest were sent to the lab. Final CT image was acquired. Patient tolerated the procedure well and remained hemodynamically stable throughout. No complications were encountered and no significant blood loss. FINDINGS: Initial CT image demonstrates perihepatic fluid collection of the right aspect of the liver, likely subcapsular given the configuration. Unchanged from prior. Drainage catheter terminates in the midline adjacent to the surgical changes of gastric bypass, similar prior. Small fluid adjacent to the spleen. Unchanged appearance of left pleural fluid. Status post drainage of right upper abdomen and left chest fluid, decreased size of the fluid collection of the right abdomen, with unchanged size of fluid collection in the chest. Paragraphs sample was sent for me age fluid location. IMPRESSION: Status post CT-guided 12 French drain placement of the right upper abdomen, and CT-guided left thoracostomy tube with 10 French drain placed. Sample was sent to the lab for culture from each location. Signed,  Yvone Neu. Loreta Ave, DO [facility] Electronically Signed   By: Gilmer Mor D.O.   On: 01/27/2017 15:32    Labs:  CBC:  Recent Labs  01/23/17 1248 01/25/17 0458 01/26/17 0433 01/27/17 0551  WBC 6.9 4.7 5.5 5.4  HGB 9.4* 8.3* 8.2* 8.2*  HCT 31.2* 27.9* 27.9* 27.3*  PLT 401* 418* 432* 444*    COAGS:  Recent Labs  01/15/17 1420 01/20/17 1744 01/23/17 0723  INR 1.07 1.19 1.12    BMP:  Recent Labs  01/25/17 0458 01/26/17 0433 01/27/17 0551 01/29/17 0405  NA 135 133* 133* 133*  K 4.0 4.0 4.0 4.0  CL 100* 98* 98* 98*  CO2 28 28 28 28   GLUCOSE 160* 137* 158* 131*  BUN 8 9 8 9   CALCIUM 8.2* 8.3* 8.1* 8.2*  CREATININE 0.46* 0.48* 0.48* 0.46*  GFRNONAA >60 >60 >60 >60  GFRAA >60 >60 >60 >60    LIVER FUNCTION TESTS:  Recent Labs  01/22/17 0553 01/23/17 1248 01/26/17 0433 01/27/17 1937 01/29/17 0405  BILITOT 0.6 0.8 0.4  --  0.2*  AST 30 27 18   --  17  ALT 18 19 15*  --  10*  ALKPHOS 139* 175* 116  --  83  PROT 5.7* 6.7 6.4*  --  6.0*  ALBUMIN 1.6* 1.9* 1.9* 1.7* 1.8*    Assessment and Plan: 1. Multiple intra-abdominal drains 2. Left chest drain  Cont all 4 drains at this time.  Each is still draining too much to remove.  Care of chest tube deferred to pulmonary at this time.   We will cont to follow all drains at this time.  Electronically Signed: Letha Cape 01/30/2017, 12:05 PM   I spent a total of 15 Minutes at the the patient's bedside AND on the patient's hospital floor or unit, greater than 50% of which was counseling/coordinating care for abdominal fluid collections, left chest fluid collection

## 2017-01-30 NOTE — Progress Notes (Addendum)
PROGRESS NOTE    Albert White  ZOX:[id number] DOB: 17-Sep-1975 DOA: 01/15/2017 PCP: Alysia Penna, MD   Brief Narrative: 41 year old male with history of type 2 diabetes, dyslipidemia, obstructive sleep apnea, Roux-en-Y/gastric bypass on 12/08/16 in [place] by Dr. Franki Cabot, rehospitalized 12/30/16-01/10/17 for anasarca, abdominal pain, fever of 103F and postoperative seroma for which a percutaneous drain was placed. After discharge from hospital patient continued to have abdominal pain, associated with nausea, emesis and decreased appetite, outpatient CT of the abdomen showedgas containing 10.7 x 5 cm abscess abutting the posterior margin of the GJ anastomosis worrisome for dehiscence, diffuse peritonitis with large gas containing abscess throughout the peritoneal cavity including a right perihepatic abscess compressing the right liver lobe and provoking anileus, findings that prompted rehospitalization.   Patient was admitted to the hospital with the working diagnosis of abdominal sepsis due to peritonitis, and intra-abdominal abscess related to a postoperative complication of Roux-en-Y/gastric bypass (12/08/16).  Assessment & Plan:  # Sepsis due to peritonitis and multiple intra-abdominal abscess, colitis - s/p RLQ drain placement on 01/16/17, peri-epigastric drain placement 9/5  and hepatic abscess drain and chest tube placement by 9/11. IR consult appreciated. -Currently on Invanz, Flagyl as per infectious disease. -Follow up culture results. Cultures negative from September 11. -Follow-up surgery team per further plan including evaluation for need of imaging studies. Discontinue TPN today. Patient is tolerating oral diet.  # Pain management: Patient is currently controlled. Continue current pain management. Plan to wean down as tolerated. Continue bowel regimen.   #Pleural effusion: CT scan with a moderate left pleural effusion with loculation status post chest tube placement.  CT surgery following. -Status post lytic Treatment by pulmonologist. Follow-up culture plan. --repeat CXR with congestion, echo unremarkable except mild concentric hypertrophy.  #Type 2 diabetes: Monitor blood sugar level. Continue sliding scale.  #Obstructive sleep apnea: Continue CPAP. Continue incentive spirometer  #Anemia multifactorial etiology, likely due to chronic disease: Monitor CBC. No sign of bleeding  #History of morbid obesity  #Hypomagnesemia: Replete magnesium sulfate. Monitor labs.  Patient with multiple surgical issues and related complications. Managed by multiple specialists including infectious disease, general surgery, interventional radiology, pulmonologist, cardiothoracic surgeon, alll appreciated.   Follow-up IR for all abdominal drain. Pulmonary following for a chest tube.  Active Problems:   Postprocedural intraabdominal abscess   Pleural effusion   Peritonitis (HCC)   Gastrointestinal anastomotic leak   Acute pulmonary edema (HCC)  DVT prophylaxis: Lovenox subcutaneous Code Status: Full code Family Communication: Discussed with the patient's wife over the phone at length Disposition Plan: Currently admitted    Consultants:   General surgery  IR  Infectious disease  Pulmonologist  Cardiothoracic surgery  Pharmacist  Procedures: Multiple drain placement Antimicrobials: Currently on Invanz and Flagyl  Subjective: Seen and examined at bedside. Pain is better controlled. Denied nausea vomiting chest pain shortness of breath. Tolerating oral diet. Objective: Vitals:   01/29/17 0411 01/29/17 1451 01/29/17 2014 01/30/17 0427  BP: (!) 140/91 127/89 (!) 140/92 132/81  Pulse: (!) 102 (!) 108 98 99  Resp: Temp: 97.6 F (36.4 C) 97.7 F (36.5 C) 98 F (36.7 C) 98.1 F (36.7 C)  TempSrc: Oral Oral Oral Oral  SpO2: 96% 98% 97% 96%  Weight:    (!) 151.2 kg (333 lb 6.4 oz)  Height:        Intake/Output Summary (Last 24 hours)  at 01/30/17 1247 Last data filed at 01/30/17 1016  Gross per 24 hour  Intake  4207.83 ml  Output             3857 ml  Net           350.83 ml   Filed Weights   01/25/17 0416 01/26/17 0409 01/30/17 0427  Weight: (!) 155.4 kg (342 lb 11.2 oz) (!) 154.6 kg (340 lb 12.8 oz) (!) 151.2 kg (333 lb 6.4 oz)    Examination:  General exam:Lying on bed comfortable, not in distress Respiratory system: Bibasal decreased breath sound, respiratory effort normal, chest is present Cardiovascular system: Regular rate rhythm S1-S2 is normal. Gastrointestinal system: Abdomen soft, mildly distended, multiple abdominal drains. Bowel sound positive. Central nervous system: Alert awake and following commands. Skin: No rashes, lesions or ulcers Psychiatry: Judgement and insight appear normal. Mood & affect appropriate.     Data Reviewed: I have personally reviewed following labs and imaging studies  CBC:  Recent Labs Lab 01/23/17 1248 01/25/17 0458 01/26/17 0433 01/27/17 0551  WBC 6.9 4.7 5.5 5.4  NEUTROABS  --   --  3.5  --   HGB 9.4* 8.3* 8.2* 8.2*  HCT 31.2* 27.9* 27.9* 27.3*  MCV 85.2 85.6 85.6 85.3  PLT 401* 418* 432* 444*   Basic Metabolic Panel:  Recent Labs Lab 01/24/17 0501 01/25/17 0458 01/26/17 0433 01/27/17 0551 01/29/17 0405  NA 133* 135 133* 133* 133*  K 3.8 4.0 4.0 4.0 4.0  CL 97* 100* 98* 98* 98*  CO2 GLUCOSE 169* 160* 137* 158* 131*  BUN CREATININE 0.48* 0.46* 0.48* 0.48* 0.46*  CALCIUM 8.1* 8.2* 8.3* 8.1* 8.2*  MG 1.7  --  1.7 1.7 1.5*  PHOS 3.9  --  4.7* 4.4 4.5   GFR: Estimated Creatinine Clearance: 181.1 mL/min (A) (by C-G formula based on SCr of 0.46 mg/dL (L)). Liver Function Tests:  Recent Labs Lab 01/23/17 1248 01/26/17 0433 01/27/17 1937 01/29/17 0405  AST 27 18  --  17  ALT 19 15*  --  10*  ALKPHOS 175* 116  --  83  BILITOT 0.8 0.4  --  0.2*  PROT 6.7 6.4*  --  6.0*  ALBUMIN 1.9* 1.9* 1.7* 1.8*   No  results for input(s): LIPASE, AMYLASE in the last 168 hours. No results for input(s): AMMONIA in the last 168 hours. Coagulation Profile: No results for input(s): INR, PROTIME in the last 168 hours. Cardiac Enzymes: No results for input(s): CKTOTAL, CKMB, CKMBINDEX, TROPONINI in the last 168 hours. BNP (last 3 results) No results for input(s): PROBNP in the last 8760 hours. HbA1C: No results for input(s): HGBA1C in the last 72 hours. CBG:  Recent Labs Lab 01/29/17 2011 01/30/17 0010 01/30/17 0425 01/30/17 0734 01/30/17 1209  GLUCAP 99 137* 129* 136* 114*   Lipid Profile: No results for input(s): CHOL, HDL, LDLCALC, TRIG, CHOLHDL, LDLDIRECT in the last 72 hours. Thyroid Function Tests: No results for input(s): TSH, T4TOTAL, FREET4, T3FREE, THYROIDAB in the last 72 hours. Anemia Panel: No results for input(s): VITAMINB12, FOLATE, FERRITIN, TIBC, IRON, RETICCTPCT in the last 72 hours. Sepsis Labs: No results for input(s): PROCALCITON, LATICACIDVEN in the last 168 hours.  Recent Results (from the past 240 hour(s))  Aerobic/Anaerobic Culture (surgical/deep wound)     Status: None   Collection Time: 01/21/17 10:04 AM  Result Value Ref Range Status   Specimen Description ABSCESS  Final   Special Requests DRAIN POST CT GUIDED PLACEMENT  Final   Gram Stain  Final    ABUNDANT WBC PRESENT,BOTH PMN AND MONONUCLEAR ABUNDANT GRAM POSITIVE COCCI IN PAIRS ABUNDANT GRAM VARIABLE ROD    Culture   Final    ABUNDANT CITROBACTER FREUNDII FEW VIRIDANS STREPTOCOCCUS CALL MICROBIOLOGY LAB IF SENSITIVITIES ARE REQUIRED. ABUNDANT PREVOTELLA DENTICOLA BETA LACTAMASE POSITIVE    Report Status 01/25/2017 FINAL  Final   Organism ID, Bacteria CITROBACTER FREUNDII  Final      Susceptibility   Citrobacter freundii - MIC*    CEFAZOLIN >=64 RESISTANT Resistant     CEFEPIME <=1 SENSITIVE Sensitive     CEFTAZIDIME >=64 RESISTANT Resistant     CEFTRIAXONE 16 INTERMEDIATE Intermediate      CIPROFLOXACIN <=0.25 SENSITIVE Sensitive     GENTAMICIN <=1 SENSITIVE Sensitive     IMIPENEM <=0.25 SENSITIVE Sensitive     TRIMETH/SULFA <=20 SENSITIVE Sensitive     PIP/TAZO 64 INTERMEDIATE Intermediate     * ABUNDANT CITROBACTER FREUNDII  Gram stain     Status: None   Collection Time: 01/23/17 12:53 PM  Result Value Ref Range Status   Specimen Description PLEURAL LEFT  Final   Special Requests NONE  Final   Gram Stain NO WBC SEEN NO ORGANISMS SEEN   Final   Report Status 01/23/2017 FINAL  Final  Culture, body fluid-bottle     Status: None   Collection Time: 01/23/17 12:53 PM  Result Value Ref Range Status   Specimen Description PLEURAL LEFT  Final   Special Requests NONE  Final   Culture NO GROWTH 5 DAYS  Final   Report Status 01/28/2017 FINAL  Final  Aerobic/Anaerobic Culture (surgical/deep wound)     Status: None (Preliminary result)   Collection Time: 01/27/17  1:58 PM  Result Value Ref Range Status   Specimen Description ABSCESS PLEURAL LEFT  Final   Special Requests SYRINGE  Final   Gram Stain   Final    RARE WBC PRESENT,BOTH PMN AND MONONUCLEAR NO ORGANISMS SEEN    Culture   Final    NO GROWTH 3 DAYS NO ANAEROBES ISOLATED; CULTURE IN PROGRESS FOR 5 DAYS   Report Status PENDING  Incomplete  Aerobic/Anaerobic Culture (surgical/deep wound)     Status: None (Preliminary result)   Collection Time: 01/27/17  2:34 PM  Result Value Ref Range Status   Specimen Description ABDOMEN  Final   Special Requests RIGHT PERIHEPATIC FLUID  Final   Gram Stain   Final    MODERATE WBC PRESENT,BOTH PMN AND MONONUCLEAR NO ORGANISMS SEEN    Culture   Final    NO GROWTH 3 DAYS NO ANAEROBES ISOLATED; CULTURE IN PROGRESS FOR 5 DAYS   Report Status PENDING  Incomplete         Radiology Studies: Dg Chest Port 1 View  Result Date: 01/30/2017 CLINICAL DATA:  Pleural effusion.  Chest tubes. EXAM: PORTABLE CHEST 1 VIEW COMPARISON:  01/29/2017 . FINDINGS: Bilateral chest tubes in stable  position. Right PICC line stable position. Cardiomegaly with diffuse bilateral pulmonary infiltrates/edema and bilateral pleural effusions. No pneumothorax. IMPRESSION: 1. Bilateral chest tubes and right PICC line in stable position. No pneumothorax. 2. Cardiomegaly with diffuse bilateral pulmonary infiltrates/ edema and bilateral pleural effusions. Findings consistent CHF. No interim change from prior exam. Electronically Signed   By: Maisie Fus  Register   On: 01/30/2017 07:15   Dg Chest Port 1 View  Result Date: 01/29/2017 CLINICAL DATA:  Pleural effusion.  Chest tube. EXAM: PORTABLE CHEST 1 VIEW COMPARISON:  01/28/2017 . FINDINGS: Bilateral chest tubes again noted .  right PICC line stable position. Cardiomegaly with bilateral pulmonary infiltrates most consistent pulmonary edema. Small bilateral pleural effusions again noted. IMPRESSION: 1. Bilateral chest tubes and right PICC line stable position. No pneumothorax. 2. Findings consistent congestive heart failure bilateral from interstitial edema bilateral pleural effusions again noted. No interim change . Electronically Signed   By: Maisie Fus  Register   On: 01/29/2017 07:29        Scheduled Meds: . acetaminophen  1,000 mg Oral Q8H  . alteplase (TPA) for intrapleural administration  10 mg Intrapleural Q12H   And  . pulmozyme (DORNASE) for intrapleural administration  5 mg Intrapleural Q12H  . chlorhexidine  15 mL Mouth Rinse BID  . Chlorhexidine Gluconate Cloth  6 each Topical Daily  . enoxaparin (LOVENOX) injection  40 mg Subcutaneous Q12H  . hydrocortisone   Rectal BID  . insulin aspart  0-20 Units Subcutaneous Q4H  . mouth rinse  15 mL Mouth Rinse q12n4p  . methocarbamol  500 mg Oral TID  . pneumococcal 23 valent vaccine  0.5 mL Intramuscular Tomorrow-1000  . polyethylene glycol  17 g Oral Daily  . protein supplement shake  2 oz Oral QID  . sodium chloride flush  5 mL Intravenous Q8H  . sodium chloride flush  5 mL Intravenous Q8H  .  sodium chloride flush  5 mL Intravenous Q8H   Continuous Infusions: . Marland KitchenTPN (CLINIMIX-E) Adult 90 mL/hr at 01/29/17 1751  . sodium chloride 35 mL/hr (01/29/17 0235)  . ertapenem Stopped (01/29/17 1932)  . metronidazole Stopped (01/30/17 0802)     LOS: 15 days    Dron Jaynie Collins, MD [facility] Pager [phone]  If 7PM-7AM, please contact night-coverage [email or web address] Password [email or web address] 01/30/2017, 12:47 PM

## 2017-01-31 ENCOUNTER — Inpatient Hospital Stay (HOSPITAL_COMMUNITY): Payer: 59

## 2017-01-31 ENCOUNTER — Encounter (HOSPITAL_COMMUNITY): Payer: Self-pay | Admitting: Radiology

## 2017-01-31 DIAGNOSIS — J90 Pleural effusion, not elsewhere classified: Secondary | ICD-10-CM

## 2017-01-31 LAB — GLUCOSE, CAPILLARY
GLUCOSE-CAPILLARY: 102 mg/dL — AB (ref 65–99)
GLUCOSE-CAPILLARY: 103 mg/dL — AB (ref 65–99)
Glucose-Capillary: 108 mg/dL — ABNORMAL HIGH (ref 65–99)
Glucose-Capillary: 102 mg/dL — ABNORMAL HIGH (ref 65–99)

## 2017-01-31 LAB — BASIC METABOLIC PANEL
ANION GAP: 6 (ref 5–15)
BUN: 8 mg/dL (ref 6–20)
CHLORIDE: 100 mmol/L — AB (ref 101–111)
CO2: 28 mmol/L (ref 22–32)
Calcium: 8.1 mg/dL — ABNORMAL LOW (ref 8.9–10.3)
Creatinine, Ser: 0.59 mg/dL — ABNORMAL LOW (ref 0.61–1.24)
GFR calc non Af Amer: >60 mL/min (ref >60)
Glucose, Bld: 104 mg/dL — ABNORMAL HIGH (ref 65–99)
Potassium: 4 mmol/L (ref 3.5–5.1)
Sodium: 134 mmol/L — ABNORMAL LOW (ref 135–145)

## 2017-01-31 LAB — CBC
HEMATOCRIT: 27.3 % — AB (ref 39.0–52.0)
HEMOGLOBIN: 8.2 g/dL — AB (ref 13.0–17.0)
MCH: 25.3 pg — ABNORMAL LOW (ref 26.0–34.0)
MCHC: 30 g/dL (ref 30.0–36.0)
MCV: 84.3 fL (ref 78.0–100.0)
Platelets: 392 10*3/uL (ref 150–400)
RBC: 3.24 MIL/uL — ABNORMAL LOW (ref 4.22–5.81)
RDW: 16 % — ABNORMAL HIGH (ref 11.5–15.5)
WBC: 4.6 10*3/uL (ref 4.0–10.5)

## 2017-01-31 LAB — MAGNESIUM: Magnesium: 1.5 mg/dL — ABNORMAL LOW (ref 1.7–2.4)

## 2017-01-31 MED ORDER — MAGNESIUM SULFATE 2 GM/50ML IV SOLN
2.0000 g | Freq: Once | INTRAVENOUS | Status: AC
  Administered 2017-01-31: 2 g via INTRAVENOUS
  Filled 2017-01-31: qty 50

## 2017-01-31 MED ORDER — IOPAMIDOL (ISOVUE-300) INJECTION 61%
INTRAVENOUS | Status: AC
  Administered 2017-01-31: 100 mL
  Filled 2017-01-31: qty 100

## 2017-01-31 MED ORDER — LACTATED RINGERS IV SOLN
INTRAVENOUS | Status: DC
  Administered 2017-01-31 – 2017-02-02 (×4): via INTRAVENOUS

## 2017-01-31 NOTE — Progress Notes (Signed)
Subjective: Alert and stable I was informed that he was having more abdominal pain today However, and I saw him he states that his abdomen is feeling somewhat better and the only pain he has is at the left chest tube site Tolerating clear liquids and protein shakes.  No nausea or vomiting.  Bilateral loculated pleural effusions seem to be slowly improving.  Followed by cardio thoracic surgery and pulmonary medicine Right upper quadrant drain with minimal output Midline abdominal drain still draining serosanguineous  Lab work today shows WBC 4600.  Hemoglobin 8.2.  Potassium 4.0.  Creatinine 0.59.  Glucose 104.   Objective: Vital signs in last 24 hours: Temp:  [97.8 F (36.6 C)-98.4 F (36.9 C)] 97.8 F (36.6 C) (09/15 1610) Pulse Rate:  [93-104] 94 (09/15 0638) Resp:  [18-19] 18 (09/15 9604) BP: (121-133)/(73-81) 124/73 (09/15 5409) SpO2:  [91 %-98 %] 91 % (09/15 8119) Weight:  [150.6 kg (332 lb)] 150.6 kg (332 lb) (09/15 1478) Last BM Date: 01/30/17  Intake/Output from previous day: 09/14 0701 - 09/15 0700 In: 1155 [P.O.:1020; I.V.:30] Out: 2395 [Urine:1900; Drains:495] Intake/Output this shift: No intake/output data recorded.    PE: Gen:  Alert, NAD, friendly.  Does not appear to be in any distress HEENT: EOM's intact, pupils equal and round Card:  RRR, no M/G/R heard Pulm:  effort normal. Tender over left chest wall tube insertion site but skin and subcutaneous tissues look normal.  No infection or hematoma Abd: obese, soft, ND, tender around drains x3 +BS Ext:  No erythema. Mild BLE edema Psych: A&Ox3  Skin: no rashes noted, warm and dry   Lab Results:   Recent Labs  01/31/17 0449  WBC 4.6  HGB 8.2*  HCT 27.3*  PLT 392   BMET  Recent Labs  01/29/17 0405 01/31/17 0449  NA 133* 134*  K 4.0 4.0  CL 98* 100*  CO2 28 28  GLUCOSE 131* 104*  BUN 9 8  CREATININE 0.46* 0.59*  CALCIUM 8.2* 8.1*   PT/INR No results for input(s): LABPROT, INR in the  last 72 hours. ABG No results for input(s): PHART, HCO3 in the last 72 hours.  Invalid input(s): PCO2, PO2  Studies/Results: Ct Chest Wo Contrast  Result Date: 01/30/2017 CLINICAL DATA:  Evaluate pleural effusion EXAM: CT CHEST WITHOUT CONTRAST TECHNIQUE: Multidetector CT imaging of the chest was performed following the standard protocol without IV contrast. COMPARISON:  01/24/2017 CT and 01/30/2017 CXR FINDINGS: Cardiovascular: PICC line tip terminates in the brachiocephalic confluence. Heart size is normal. No significant pericardial effusion. No aneurysm of the aorta. Mediastinum/Nodes: Midline trachea. No thyroid mass. No mediastinal adenopathy. Hilum is difficult to evaluate without IV contrast. Esophagus is within normal limits. Lungs/Pleura: Bilateral chest tubes are identified, one coiled along the base of the right lung and the second along the periphery of the left hemithorax. Small ex vacuo pneumothorax status post drainage of left pleural fluid is seen along the posterior aspect of the left lung along the major fissure with small amount of air noted overlying the left lung apex. Slight interval increase in right pleural effusion since prior despite drainage catheter measuring 3.5 cm in thickness versus approximately 2.5 cm in thickness previously. Upper Abdomen: A percutaneous pigtail catheter is again seen in the left upper quadrant of the abdomen adjacent to the bypassed stomach with further decrease in abscess collection. Second right upper quadrant pigtail drainage catheter overlies the right hepatic lobe with smaller volume of crescentic fluid outlining the liver currently. Musculoskeletal:  No acute nor suspicious osseous abnormality. IMPRESSION: 1. Bilateral chest tubes and upper abdominal drainage catheters in place as above. 2. There has been some interval increase in right-sided pleural effusion despite presence of a right basilar chest tube. Small pneumothorax ex vacuo status post  drainage left pleural effusion with smaller volume of pleural fluid seen. 3. Decrease in right perihepatic fluid collection since prior as well as adjacent to the bypassed stomach. Electronically Signed   By: Tollie Eth M.D.   On: 01/30/2017 20:54   Dg Chest Port 1 View  Result Date: 01/30/2017 CLINICAL DATA:  Pleural effusion.  Chest tubes. EXAM: PORTABLE CHEST 1 VIEW COMPARISON:  01/29/2017 . FINDINGS: Bilateral chest tubes in stable position. Right PICC line stable position. Cardiomegaly with diffuse bilateral pulmonary infiltrates/edema and bilateral pleural effusions. No pneumothorax. IMPRESSION: 1. Bilateral chest tubes and right PICC line in stable position. No pneumothorax. 2. Cardiomegaly with diffuse bilateral pulmonary infiltrates/ edema and bilateral pleural effusions. Findings consistent CHF. No interim change from prior exam. Electronically Signed   By: Maisie Fus  Register   On: 01/30/2017 07:15    Anti-infectives: Anti-infectives    Start     Dose/Rate Route Frequency Ordered Stop   01/27/17 1900  ertapenem (INVANZ) 1 g in sodium chloride 0.9 % 50 mL IVPB     1 g 100 mL/hr over 30 Minutes Intravenous Every 24 hours 01/27/17 1836     01/23/17 1500  metroNIDAZOLE (FLAGYL) IVPB 500 mg     500 mg 100 mL/hr over 60 Minutes Intravenous Every 8 hours 01/23/17 1353     01/23/17 1500  ceFEPIme (MAXIPIME) 2 g in dextrose 5 % 50 mL IVPB  Status:  Discontinued     2 g 100 mL/hr over 30 Minutes Intravenous Every 8 hours 01/23/17 1357 01/27/17 1835   01/22/17 2000  vancomycin (VANCOCIN) 1,250 mg in sodium chloride 0.9 % 250 mL IVPB  Status:  Discontinued     1,250 mg 166.7 mL/hr over 90 Minutes Intravenous Every 8 hours 01/22/17 1202 01/23/17 1358   01/22/17 1230  vancomycin (VANCOCIN) 2,500 mg in sodium chloride 0.9 % 500 mL IVPB     2,500 mg 250 mL/hr over 120 Minutes Intravenous  Once 01/22/17 1147 01/22/17 1438   01/15/17 1815  piperacillin-tazobactam (ZOSYN) IVPB 3.375 g  Status:   Discontinued     3.375 g 12.5 mL/hr over 240 Minutes Intravenous Every 8 hours 01/15/17 1804 01/15/17 1804   01/15/17 1630  piperacillin-tazobactam (ZOSYN) IVPB 3.375 g  Status:  Discontinued     3.375 g 12.5 mL/hr over 240 Minutes Intravenous Every 8 hours 01/15/17 1616 01/23/17 1354      Assessment/Plan:  Status post Roux-en-Y/gastric bypass,12/08/16, Dr. Clent Ridges Leak at gastrojejunostomy with multiple abscesses. -S/P perc drain 01/16/17 >> cx RARE GARDNERELLA VAGINALIS -S/p IR drain 01/21/17 >> cs ABUNDANT CITROBACTER FREUNDII, FEW VIRIDANS STREPTOCOCCUS -S/p IR thoracentesis 01/23/17 =>> cx no organisms -S/p IR Perc drain R perihepatic abscess 9/11 >> cx NGTD -S/p IR perc drain L chest exudative effusion 9/11 >> cx NGTD. S/p thrombolytic instillation via CT 9/13 and 9/14. -all things considered, he looks pretty good.  FEN - bariatric clear liquids VTE - SCDs, lovenox ID - Zosyn 8/30>>01/23/17; Vancomycin 9/6>>01/23/17; Cefepime 9/7>>01/27/17; Flagyl 9/7>>8 days; Invanz 9/11>>Day#4 Pain - scheduled robaxin, tylenol. PRN dilaudid and oxycodone.  Plan: D/c TNA and continue bariatric clears/protein shakes. Wean pain medication. Continue abdominal drains. Follow cultures. CT per CT surgery. Will  repeat CT abdomen today to make sure  that all fluid collections are evacuated From abdominal standpoint patient will be discharged with drains/antibiotics on current diet, and followup with Dr. Sheliah Hatch.   If CT abdomen looks good this could be tomorrow or the next day, pending recommendations from infectious disease, pulmonary medicine, and thoracic surgery. Appreciate ID recommendations on antibiotics.   Diabetes. Morbid obesity OSA with history see Pap Hyperlipidemia   LOS: 16 days    Albert White M 01/31/2017

## 2017-01-31 NOTE — Progress Notes (Signed)
PROGRESS NOTE    Albert White  JXB:[id number] DOB: 29-Nov-1975 DOA: 01/15/2017 PCP: Alysia Penna, MD   Brief Narrative: 41 year old male with history of type 2 diabetes, dyslipidemia, obstructive sleep apnea, Roux-en-Y/gastric bypass on 12/08/16 in [place] by Dr. Franki Cabot, rehospitalized 12/30/16-01/10/17 for anasarca, abdominal pain, fever of 103F and postoperative seroma for which a percutaneous drain was placed. After discharge from hospital patient continued to have abdominal pain, associated with nausea, emesis and decreased appetite, outpatient CT of the abdomen showedgas containing 10.7 x 5 cm abscess abutting the posterior margin of the GJ anastomosis worrisome for dehiscence, diffuse peritonitis with large gas containing abscess throughout the peritoneal cavity including a right perihepatic abscess compressing the right liver lobe and provoking anileus, findings that prompted rehospitalization.   Patient was admitted to the hospital with the working diagnosis of abdominal sepsis due to peritonitis, and intra-abdominal abscess related to a postoperative complication of Roux-en-Y/gastric bypass (12/08/16).  Assessment & Plan:  # Sepsis due to peritonitis and multiple intra-abdominal abscess, colitis - s/p RLQ drain placement on 01/16/17, peri-epigastric drain placement 9/5  and hepatic abscess drain and chest tube placement by 9/11. IR consult appreciated. -Currently on Invanz, Flagyl as per infectious disease. -Follow up culture results. Cultures negative from September 11. -Patient is off TPN and tolerating diet. Patient reported abdominal discomfort is stable. Plan for CT abdomen pelvis as per surgery team. Discussed with Dr.Ingram today  # Pain management: Patient reported pain is at the site of left chest tube. Currently controlled with the oral oxycodone and Dilaudid IV. Try to wean gradually. Discussed with the patient. On MiraLAX for bowel regimen.  #Pleural effusion:  CT scan with a moderate left pleural effusion with loculation status post chest tube placement. CT surgery following. -Status post lytic treatment by pulmonologist. Possibly removal of chest tube today as per pulmonologist and CT surgery. --repeat CXR with congestion, echo unremarkable except mild concentric hypertrophy.  #Type 2 diabetes: Monitor blood sugar level. Continue sliding scale.  #Obstructive sleep apnea: Continue CPAP. Continue incentive spirometer  #Anemia multifactorial etiology, likely due to chronic disease: Monitor CBC. No sign of bleeding. Hemoglobin is stable  #History of morbid obesity  #Hypomagnesemia: Repleted magnesium sulfate. Monitor labs.  Patient with multiple surgical issues and related complications. Managed by multiple specialists including infectious disease, general surgery, interventional radiology, pulmonologist, cardiothoracic surgeon, alll appreciated.   Follow-up IR for all abdominal drain. Pulmonary following for a chest tube.  Active Problems:   Postprocedural intraabdominal abscess   Pleural effusion on left   Peritonitis (HCC)   Gastrointestinal anastomotic leak   Acute pulmonary edema (HCC)   Hypomagnesemia  DVT prophylaxis: Lovenox subcutaneous Code Status: Full code Family Communication: Discussed with the patient's wife over the phone at length yesterday Disposition Plan: Currently admitted    Consultants:   General surgery  IR  Infectious disease  Pulmonologist  Cardiothoracic surgery  Pharmacist  Procedures: Multiple drain placement Antimicrobials: Currently on Invanz and Flagyl  Subjective: Seen and examined at bedside. Reports some discomfort at the site of left chest tube. Denied change in abdominal pain. No nausea vomiting or shortness of breath. Objective: Vitals:   01/30/17 0427 01/30/17 1326 01/30/17 2121 01/31/17 0638  BP: 132/81 121/78 133/81 124/73  Pulse: 99 93 (!) 104 94  Resp: Temp: 98.1  F (36.7 C) 98.4 F (36.9 C) 98.2 F (36.8 C) 97.8 F (36.6 C)  TempSrc: Oral Oral Oral Oral  SpO2: 96% 98% 95% 91%  Weight: (!) 151.2 kg (333 lb 6.4 oz)   (!) 150.6 kg (332 lb)  Height:        Intake/Output Summary (Last 24 hours) at 01/31/17 1112 Last data filed at 01/31/17 1191  Gross per 24 hour  Intake              895 ml  Output             1595 ml  Net             -700 ml   Filed Weights   01/26/17 0409 01/30/17 0427 01/31/17 4782  Weight: (!) 154.6 kg (340 lb 12.8 oz) (!) 151.2 kg (333 lb 6.4 oz) (!) 150.6 kg (332 lb)    Examination:  General exam:Lying on bed comfortable, not in distress Respiratory system: Bibasal decreased,respiratory effortnormal, chest tube site no bleeding Cardiovascular system: Regular rate rhythm S1-S2 normal. Gastrointestinal system: Abdomen soft, mild distention, nontender. Multiple drains Central nervous system: Alert awake and following commands. Skin: No rashes, lesions or ulcers Psychiatry: Judgement and insight appear normal. Mood & affect appropriate.     Data Reviewed: I have personally reviewed following labs and imaging studies  CBC:  Recent Labs Lab 01/25/17 0458 01/26/17 0433 01/27/17 0551 01/31/17 0449  WBC 4.7 5.5 5.4 4.6  NEUTROABS  --  3.5  --   --   HGB 8.3* 8.2* 8.2* 8.2*  HCT 27.9* 27.9* 27.3* 27.3*  MCV 85.6 85.6 85.3 84.3  PLT 418* 432* 444* 392   Basic Metabolic Panel:  Recent Labs Lab 01/25/17 0458 01/26/17 0433 01/27/17 0551 01/29/17 0405 01/31/17 0449  NA 135 133* 133* 133* 134*  K 4.0 4.0 4.0 4.0 4.0  CL 100* 98* 98* 98* 100*  CO2 GLUCOSE 160* 137* 158* 131* 104*  BUN CREATININE 0.46* 0.48* 0.48* 0.46* 0.59*  CALCIUM 8.2* 8.3* 8.1* 8.2* 8.1*  MG  --  1.7 1.7 1.5* 1.5*  PHOS  --  4.7* 4.4 4.5  --    GFR: Estimated Creatinine Clearance: 180.6 mL/min (A) (by C-G formula based on SCr of 0.59 mg/dL (L)). Liver Function Tests:  Recent Labs Lab 01/26/17 0433  01/27/17 1937 01/29/17 0405  AST 18  --  17  ALT 15*  --  10*  ALKPHOS 116  --  83  BILITOT 0.4  --  0.2*  PROT 6.4*  --  6.0*  ALBUMIN 1.9* 1.7* 1.8*   No results for input(s): LIPASE, AMYLASE in the last 168 hours. No results for input(s): AMMONIA in the last 168 hours. Coagulation Profile: No results for input(s): INR, PROTIME in the last 168 hours. Cardiac Enzymes: No results for input(s): CKTOTAL, CKMB, CKMBINDEX, TROPONINI in the last 168 hours. BNP (last 3 results) No results for input(s): PROBNP in the last 8760 hours. HbA1C:  Recent Labs  01/30/17 1258  HGBA1C 5.7*   CBG:  Recent Labs Lab 01/30/17 0734 01/30/17 1209 01/30/17 1617 01/30/17 2118 01/31/17 0746  GLUCAP 136* 114* 136* 91 108*   Lipid Profile: No results for input(s): CHOL, HDL, LDLCALC, TRIG, CHOLHDL, LDLDIRECT in the last 72 hours. Thyroid Function Tests: No results for input(s): TSH, T4TOTAL, FREET4, T3FREE, THYROIDAB in the last 72 hours. Anemia Panel: No results for input(s): VITAMINB12, FOLATE, FERRITIN, TIBC, IRON, RETICCTPCT in the last 72 hours. Sepsis Labs: No results for input(s): PROCALCITON, LATICACIDVEN in the last 168 hours.  Recent Results (from the past 240 hour(s))  Gram stain     Status: None   Collection Time: 01/23/17 12:53 PM  Result Value Ref Range Status   Specimen Description PLEURAL LEFT  Final   Special Requests NONE  Final   Gram Stain NO WBC SEEN NO ORGANISMS SEEN   Final   Report Status 01/23/2017 FINAL  Final  Culture, body fluid-bottle     Status: None   Collection Time: 01/23/17 12:53 PM  Result Value Ref Range Status   Specimen Description PLEURAL LEFT  Final   Special Requests NONE  Final   Culture NO GROWTH 5 DAYS  Final   Report Status 01/28/2017 FINAL  Final  Aerobic/Anaerobic Culture (surgical/deep wound)     Status: None (Preliminary result)   Collection Time: 01/27/17  1:58 PM  Result Value Ref Range Status   Specimen Description ABSCESS  PLEURAL LEFT  Final   Special Requests SYRINGE  Final   Gram Stain   Final    RARE WBC PRESENT,BOTH PMN AND MONONUCLEAR NO ORGANISMS SEEN    Culture   Final    NO GROWTH 3 DAYS NO ANAEROBES ISOLATED; CULTURE IN PROGRESS FOR 5 DAYS   Report Status PENDING  Incomplete  Aerobic/Anaerobic Culture (surgical/deep wound)     Status: None (Preliminary result)   Collection Time: 01/27/17  2:34 PM  Result Value Ref Range Status   Specimen Description ABDOMEN  Final   Special Requests RIGHT PERIHEPATIC FLUID  Final   Gram Stain   Final    MODERATE WBC PRESENT,BOTH PMN AND MONONUCLEAR NO ORGANISMS SEEN    Culture   Final    NO GROWTH 3 DAYS NO ANAEROBES ISOLATED; CULTURE IN PROGRESS FOR 5 DAYS   Report Status PENDING  Incomplete         Radiology Studies: Ct Chest Wo Contrast  Result Date: 01/30/2017 CLINICAL DATA:  Evaluate pleural effusion EXAM: CT CHEST WITHOUT CONTRAST TECHNIQUE: Multidetector CT imaging of the chest was performed following the standard protocol without IV contrast. COMPARISON:  01/24/2017 CT and 01/30/2017 CXR FINDINGS: Cardiovascular: PICC line tip terminates in the brachiocephalic confluence. Heart size is normal. No significant pericardial effusion. No aneurysm of the aorta. Mediastinum/Nodes: Midline trachea. No thyroid mass. No mediastinal adenopathy. Hilum is difficult to evaluate without IV contrast. Esophagus is within normal limits. Lungs/Pleura: Bilateral chest tubes are identified, one coiled along the base of the right lung and the second along the periphery of the left hemithorax. Small ex vacuo pneumothorax status post drainage of left pleural fluid is seen along the posterior aspect of the left lung along the major fissure with small amount of air noted overlying the left lung apex. Slight interval increase in right pleural effusion since prior despite drainage catheter measuring 3.5 cm in thickness versus approximately 2.5 cm in thickness previously. Upper  Abdomen: A percutaneous pigtail catheter is again seen in the left upper quadrant of the abdomen adjacent to the bypassed stomach with further decrease in abscess collection. Second right upper quadrant pigtail drainage catheter overlies the right hepatic lobe with smaller volume of crescentic fluid outlining the liver currently. Musculoskeletal: No acute nor suspicious osseous abnormality. IMPRESSION: 1. Bilateral chest tubes and upper abdominal drainage catheters in place as above. 2. There has been some interval increase in right-sided pleural effusion despite presence of a right basilar chest tube. Small pneumothorax ex vacuo status post drainage left pleural effusion with smaller volume of pleural fluid seen. 3. Decrease in right perihepatic fluid collection since prior as well as adjacent  to the bypassed stomach. Electronically Signed   By: Tollie Eth M.D.   On: 01/30/2017 20:54   Dg Chest Port 1 View  Result Date: 01/30/2017 CLINICAL DATA:  Pleural effusion.  Chest tubes. EXAM: PORTABLE CHEST 1 VIEW COMPARISON:  01/29/2017 . FINDINGS: Bilateral chest tubes in stable position. Right PICC line stable position. Cardiomegaly with diffuse bilateral pulmonary infiltrates/edema and bilateral pleural effusions. No pneumothorax. IMPRESSION: 1. Bilateral chest tubes and right PICC line in stable position. No pneumothorax. 2. Cardiomegaly with diffuse bilateral pulmonary infiltrates/ edema and bilateral pleural effusions. Findings consistent CHF. No interim change from prior exam. Electronically Signed   By: Maisie Fus  Register   On: 01/30/2017 07:15        Scheduled Meds: . acetaminophen  1,000 mg Oral Q8H  . chlorhexidine  15 mL Mouth Rinse BID  . Chlorhexidine Gluconate Cloth  6 each Topical Daily  . enoxaparin (LOVENOX) injection  40 mg Subcutaneous Q12H  . hydrocortisone   Rectal BID  . insulin aspart  0-20 Units Subcutaneous TID WC  . mouth rinse  15 mL Mouth Rinse q12n4p  . methocarbamol  500 mg  Oral TID  . pneumococcal 23 valent vaccine  0.5 mL Intramuscular Tomorrow-1000  . polyethylene glycol  17 g Oral Daily  . protein supplement shake  2 oz Oral QID  . sodium chloride flush  5 mL Intravenous Q8H  . sodium chloride flush  5 mL Intravenous Q8H  . sodium chloride flush  5 mL Intravenous Q8H   Continuous Infusions: . sodium chloride 35 mL/hr at 01/30/17 1631  . ertapenem Stopped (01/30/17 2007)  . magnesium sulfate 1 - 4 g bolus IVPB    . metronidazole Stopped (01/31/17 0835)     LOS: 16 days    Dron Jaynie Collins, MD [facility] Pager [phone]  If 7PM-7AM, please contact night-coverage [email or web address] Password [email or web address] 01/31/2017, 11:12 AM

## 2017-01-31 NOTE — Progress Notes (Signed)
[facility] PCCM  S: feels better  O: Vitals:   01/30/17 0427 01/30/17 1326 01/30/17 2121 01/31/17 0638  BP: 132/81 121/78 133/81 124/73  Pulse: 99 93 (!) 104 94  Resp: Temp: 98.1 F (36.7 C) 98.4 F (36.9 C) 98.2 F (36.8 C) 97.8 F (36.6 C)  TempSrc: Oral Oral Oral Oral  SpO2: 96% 98% 95% 91%  Weight: (!) 333 lb 6.4 oz (151.2 kg)   (!) 332 lb (150.6 kg)  Height:       General: Obese resting comfortably in chair HENT: NCAT OP clear PULM: CTA B, normal effort, L chest drain in place CV: RRR, no mgr GI: BS+, soft, nontender MSK: normal bulk and tone Neuro: awake, alert, no distress, MAEW  Micro: left effusion negative R abscess drainage citrobacter and prevotella  CT chest images reviewed: some atelectasis at left base, minimal effusion on left, small R effusion: there is a R peri-hepatic drain in place, left chest tube in place; of note, radiology report mentions a R chest tube, this is not the case he has a RUQ peri-hepatic drain  Impression/plan:  Left loculated, complex pleural effusion collection: now less than 100cc drainage in 24 hours, minimal left effusion on CXR.  Will write order to remove the left chest tube  PCCM will sign off  Heber Littlefield, MD [place] [facility] Pager: [phone] Cell: [phone] After 3pm or if no response, call [phone]

## 2017-01-31 NOTE — Progress Notes (Signed)
Chest tube removed by cardiothoracic surgeon this pm

## 2017-01-31 NOTE — Progress Notes (Addendum)
      [address]       [place] [address]             [phone]         Subjective: Feels good today. Anxious to get chest tube out.   Objective: Vital signs in last 24 hours: Temp:  [97.8 F (36.6 C)-98.4 F (36.9 C)] 97.8 F (36.6 C) (09/15 4696) Pulse Rate:  [93-104] 94 (09/15 0638) Resp:  [18-19] 18 (09/15 0638) BP: (121-133)/(73-81) 124/73 (09/15 2952) SpO2:  [91 %-98 %] 91 % (09/15 8413) Weight:  [150.6 kg (332 lb)] 150.6 kg (332 lb) (09/15 2440)     Intake/Output from previous day: 09/14 0701 - 09/15 0700 In: 1155 [P.O.:1020; I.V.:30] Out: 2395 [Urine:1900; Drains:495] Intake/Output this shift: No intake/output data recorded.  General appearance: alert, cooperative and no distress Heart: regular rate and rhythm, S1, S2 normal, no murmur, click, rub or gallop Lungs: clear to auscultation bilaterally Abdomen: soft, non-tender; bowel sounds normal; no masses,  no organomegaly Extremities: 2+ non pitting pedal edema Wound: chest tube in good placement  Lab Results:  Recent Labs  01/31/17 0449  WBC 4.6  HGB 8.2*  HCT 27.3*  PLT 392   BMET:  Recent Labs  01/29/17 0405 01/31/17 0449  NA 133* 134*  K 4.0 4.0  CL 98* 100*  CO2 28 28  GLUCOSE 131* 104*  BUN 9 8  CREATININE 0.46* 0.59*  CALCIUM 8.2* 8.1*    PT/INR: No results for input(s): LABPROT, INR in the last 72 hours. ABG    Component Value Date/Time   TCO2 20 04/16/2015 1828   CBG (last 3)   Recent Labs  01/30/17 1617 01/30/17 2118 01/31/17 0746  GLUCAP 136* 91 108*    Assessment/Plan: S/P left chest tube insertion.   1. Left chest tube: CXR reviewed from yesterday:  Right peri-hepatic drain and right PICC line in stable position. Left chest tube in stable position. No pneumothorax. Cardiomegaly with diffuse bilateral pulmonary infiltrates/ edema and bilateral pleural effusions. Findings consistent CHF. No interim change from prior exam. 30cc/24 hours out of  chest tube. No air leak.    Plan: Dr. Donata Clay to review CXR. Likely can remove left chest tube today.    LOS: 16 days    Sharlene Dory 01/31/2017 Chest CT done yesterday reviewed Min effusion and drainage is nil- DC L chest drain today  P Donata Clay MD

## 2017-02-01 ENCOUNTER — Inpatient Hospital Stay (HOSPITAL_COMMUNITY): Payer: 59

## 2017-02-01 DIAGNOSIS — R52 Pain, unspecified: Secondary | ICD-10-CM

## 2017-02-01 LAB — AEROBIC/ANAEROBIC CULTURE W GRAM STAIN (SURGICAL/DEEP WOUND): Culture: NO GROWTH

## 2017-02-01 LAB — GLUCOSE, CAPILLARY
GLUCOSE-CAPILLARY: 95 mg/dL (ref 65–99)
GLUCOSE-CAPILLARY: 133 mg/dL — AB (ref 65–99)
GLUCOSE-CAPILLARY: 91 mg/dL (ref 65–99)
Glucose-Capillary: 119 mg/dL — ABNORMAL HIGH (ref 65–99)

## 2017-02-01 LAB — AEROBIC/ANAEROBIC CULTURE (SURGICAL/DEEP WOUND): CULTURE: NO GROWTH

## 2017-02-01 LAB — CBC
HEMATOCRIT: 27.3 % — AB (ref 39.0–52.0)
Hemoglobin: 8.4 g/dL — ABNORMAL LOW (ref 13.0–17.0)
MCH: 26 pg (ref 26.0–34.0)
MCHC: 30.8 g/dL (ref 30.0–36.0)
MCV: 84.5 fL (ref 78.0–100.0)
Platelets: 390 10*3/uL (ref 150–400)
RBC: 3.23 MIL/uL — ABNORMAL LOW (ref 4.22–5.81)
RDW: 16.1 % — AB (ref 11.5–15.5)
WBC: 3.9 10*3/uL — ABNORMAL LOW (ref 4.0–10.5)

## 2017-02-01 LAB — BASIC METABOLIC PANEL
Anion gap: 7 (ref 5–15)
BUN: 6 mg/dL (ref 6–20)
CALCIUM: 8.1 mg/dL — AB (ref 8.9–10.3)
CO2: 27 mmol/L (ref 22–32)
CREATININE: 0.54 mg/dL — AB (ref 0.61–1.24)
Chloride: 101 mmol/L (ref 101–111)
GFR calc Af Amer: >60 mL/min (ref >60)
GFR calc non Af Amer: >60 mL/min (ref >60)
GLUCOSE: 98 mg/dL (ref 65–99)
Potassium: 3.8 mmol/L (ref 3.5–5.1)
Sodium: 135 mmol/L (ref 135–145)

## 2017-02-01 LAB — MAGNESIUM: Magnesium: 1.5 mg/dL — ABNORMAL LOW (ref 1.7–2.4)

## 2017-02-01 MED ORDER — MAGNESIUM SULFATE 4 GM/100ML IV SOLN
4.0000 g | Freq: Once | INTRAVENOUS | Status: AC
  Administered 2017-02-01: 4 g via INTRAVENOUS
  Filled 2017-02-01: qty 100

## 2017-02-01 MED ORDER — OXYCODONE HCL 5 MG PO TABS
10.0000 mg | ORAL_TABLET | ORAL | Status: DC | PRN
  Administered 2017-02-01 – 2017-02-03 (×11): 10 mg via ORAL
  Filled 2017-02-01 (×11): qty 2

## 2017-02-01 MED ORDER — HYDROMORPHONE HCL 1 MG/ML IJ SOLN
0.5000 mg | INTRAMUSCULAR | Status: DC | PRN
  Administered 2017-02-01 – 2017-02-02 (×4): 0.5 mg via INTRAVENOUS
  Filled 2017-02-01 (×4): qty 1

## 2017-02-01 NOTE — Progress Notes (Addendum)
PROGRESS NOTE    Albert White  ZOX:[id number] DOB: 1976/05/04 DOA: 01/15/2017 PCP: Alysia Penna, MD   Brief Narrative: 41 year old male with history of type 2 diabetes, dyslipidemia, obstructive sleep apnea, Roux-en-Y/gastric bypass on 12/08/16 in [place] by Dr. Franki Cabot, rehospitalized 12/30/16-01/10/17 for anasarca, abdominal pain, fever of 103F and postoperative seroma for which a percutaneous drain was placed. After discharge from hospital patient continued to have abdominal pain, associated with nausea, emesis and decreased appetite, outpatient CT of the abdomen showedgas containing 10.7 x 5 cm abscess abutting the posterior margin of the GJ anastomosis worrisome for dehiscence, diffuse peritonitis with large gas containing abscess throughout the peritoneal cavity including a right perihepatic abscess compressing the right liver lobe and provoking anileus, findings that prompted rehospitalization.   Patient was admitted to the hospital with the working diagnosis of abdominal sepsis due to peritonitis, and intra-abdominal abscess related to a postoperative complication of Roux-en-Y/gastric bypass (12/08/16).  Assessment & Plan:  # Sepsis due to peritonitis and multiple intra-abdominal abscess, colitis - s/p RLQ drain placement on 01/16/17, peri-epigastric drain placement 9/5  and hepatic abscess drain and chest tube placement by 9/11. -Currently on Invanz, Flagyl as per infectious disease. -Follow up culture results. Cultures negative from September 11. -Patient is tolerating bariatric diet. Repeat CT scan of abdomen and pelvis with resolving intra-abdominal collection and left perisplenic fluid is essentially unchanged from before as per general surgery. Stable as per general surgery team. Patient may be able to discharge with drain with antibiotics. Follow-up consultants evaluation.  # Pain management: Patient reported pain is at the site of left chest tube. Currently  controlled with the oral oxycodone and Dilaudid IV. Reduced the dose of Dilaudid 0.5 and reduce oxycodone to 10 mg as needed. On MiraLAX for bowel regimen.  #Pleural effusion: CT scan with a moderate left pleural effusion with loculation status post chest tube placement. CT surgery following. -Status post lytic treatment by pulmonologist. Removal of chest tube on 9/15. Pulmonary and CTVS consult appreciated. --repeat CXR with congestion, echo unremarkable except mild concentric hypertrophy.  #Type 2 diabetes: Monitor blood sugar level. Continue sliding scale.  #Obstructive sleep apnea: Continue CPAP. Continue incentive spirometer  #Anemia multifactorial etiology, likely due to chronic disease: Monitor CBC. No sign of bleeding. Hemoglobin is stable  #History of morbid obesity  #Hypomagnesemia: Receiving magnesium repletion up today but still has low magnesium level. Continue magnesium repletion.  Patient with multiple surgical issues and related complications. Managed by multiple specialists including infectious disease, general surgery, interventional radiology, pulmonologist, cardiothoracic surgeon, alll appreciated.   Follow-up IR for all abdominal drain. Pulmonary following for a chest tube.  Active Problems:   Postprocedural intraabdominal abscess   Pleural effusion   Peritonitis (HCC)   Gastrointestinal anastomotic leak   Acute pulmonary edema (HCC)   Hypomagnesemia  DVT prophylaxis: Lovenox subcutaneous Code Status: Full code Family Communication: No family member at bedside today. Disposition Plan: Currently admitted    Consultants:   General surgery  IR  Infectious disease  Pulmonologist  Cardiothoracic surgery  Pharmacist  Procedures: Multiple drain placement Antimicrobials: Currently on Invanz and Flagyl  Subjective: Seen and examined at bedside. No abdominal pain. Mild discomfort at the site of drain removal on left side. Denied chest pain, shortness of  breath, nausea or vomiting. Objective: Vitals:   01/31/17 1426 01/31/17 1641 01/31/17 2132 02/01/17 0502  BP: 131/74 126/74 133/76 135/87  Pulse: (!) 107 (!) 105 (!) 106 (!) 103  Resp: 18  Temp: 98.2 F (36.8 C) 98.3 F (36.8 C) 98 F (36.7 C) 98.5 F (36.9 C)  TempSrc: Oral Oral Oral Oral  SpO2: 94% 93% 95% 96%  Weight:    (!) 152.2 kg (335 lb 8 oz)  Height:        Intake/Output Summary (Last 24 hours) at 02/01/17 1034 Last data filed at 02/01/17 1023  Gross per 24 hour  Intake           953.75 ml  Output             2025 ml  Net         -1071.25 ml   Filed Weights   01/30/17 0427 01/31/17 0638 02/01/17 0502  Weight: (!) 151.2 kg (333 lb 6.4 oz) (!) 150.6 kg (332 lb) (!) 152.2 kg (335 lb 8 oz)    Examination:  General exam:Not in distress, lying on bed comfortable Respiratory system: Bibasal decreased breath sound, left chest tube was removed. Cardiovascular system: Regular rate and rhythm S1-S2 normal. Gastrointestinal system: Abdomen soft, mild distention unchanged, nontender. Drains. Central nervous system: Alert awake and following commands. Skin: No rashes, lesions or ulcers Psychiatry: Judgement and insight appear normal. Mood & affect appropriate.     Data Reviewed: I have personally reviewed following labs and imaging studies  CBC:  Recent Labs Lab 01/26/17 0433 01/27/17 0551 01/31/17 0449 02/01/17 0459  WBC 5.5 5.4 4.6 3.9*  NEUTROABS 3.5  --   --   --   HGB 8.2* 8.2* 8.2* 8.4*  HCT 27.9* 27.3* 27.3* 27.3*  MCV 85.6 85.3 84.3 84.5  PLT 432* 444* 392 390   Basic Metabolic Panel:  Recent Labs Lab 01/26/17 0433 01/27/17 0551 01/29/17 0405 01/31/17 0449 02/01/17 0459  NA 133* 133* 133* 134* 135  K 4.0 4.0 4.0 4.0 3.8  CL 98* 98* 98* 100* 101  CO2 GLUCOSE 137* 158* 131* 104* 98  BUN CREATININE 0.48* 0.48* 0.46* 0.59* 0.54*  CALCIUM 8.3* 8.1* 8.2* 8.1* 8.1*  MG 1.7 1.7 1.5* 1.5* 1.5*  PHOS 4.7* 4.4  4.5  --   --    GFR: Estimated Creatinine Clearance: 181.8 mL/min (A) (by C-G formula based on SCr of 0.54 mg/dL (L)). Liver Function Tests:  Recent Labs Lab 01/26/17 0433 01/27/17 1937 01/29/17 0405  AST 18  --  17  ALT 15*  --  10*  ALKPHOS 116  --  83  BILITOT 0.4  --  0.2*  PROT 6.4*  --  6.0*  ALBUMIN 1.9* 1.7* 1.8*   No results for input(s): LIPASE, AMYLASE in the last 168 hours. No results for input(s): AMMONIA in the last 168 hours. Coagulation Profile: No results for input(s): INR, PROTIME in the last 168 hours. Cardiac Enzymes: No results for input(s): CKTOTAL, CKMB, CKMBINDEX, TROPONINI in the last 168 hours. BNP (last 3 results) No results for input(s): PROBNP in the last 8760 hours. HbA1C:  Recent Labs  01/30/17 1258  HGBA1C 5.7*   CBG:  Recent Labs Lab 01/31/17 0746 01/31/17 1236 01/31/17 1714 01/31/17 2126 02/01/17 0800  GLUCAP 108* 102* 102* 103* 95   Lipid Profile: No results for input(s): CHOL, HDL, LDLCALC, TRIG, CHOLHDL, LDLDIRECT in the last 72 hours. Thyroid Function Tests: No results for input(s): TSH, T4TOTAL, FREET4, T3FREE, THYROIDAB in the last 72 hours. Anemia Panel: No results for input(s): VITAMINB12, FOLATE, FERRITIN, TIBC, IRON, RETICCTPCT in the last 72 hours. Sepsis Labs:  No results for input(s): PROCALCITON, LATICACIDVEN in the last 168 hours.  Recent Results (from the past 240 hour(s))  Gram stain     Status: None   Collection Time: 01/23/17 12:53 PM  Result Value Ref Range Status   Specimen Description PLEURAL LEFT  Final   Special Requests NONE  Final   Gram Stain NO WBC SEEN NO ORGANISMS SEEN   Final   Report Status 01/23/2017 FINAL  Final  Culture, body fluid-bottle     Status: None   Collection Time: 01/23/17 12:53 PM  Result Value Ref Range Status   Specimen Description PLEURAL LEFT  Final   Special Requests NONE  Final   Culture NO GROWTH 5 DAYS  Final   Report Status 01/28/2017 FINAL  Final    Aerobic/Anaerobic Culture (surgical/deep wound)     Status: None (Preliminary result)   Collection Time: 01/27/17  1:58 PM  Result Value Ref Range Status   Specimen Description ABSCESS PLEURAL LEFT  Final   Special Requests SYRINGE  Final   Gram Stain   Final    RARE WBC PRESENT,BOTH PMN AND MONONUCLEAR NO ORGANISMS SEEN    Culture   Final    NO GROWTH 4 DAYS NO ANAEROBES ISOLATED; CULTURE IN PROGRESS FOR 5 DAYS   Report Status PENDING  Incomplete  Aerobic/Anaerobic Culture (surgical/deep wound)     Status: None (Preliminary result)   Collection Time: 01/27/17  2:34 PM  Result Value Ref Range Status   Specimen Description ABDOMEN  Final   Special Requests RIGHT PERIHEPATIC FLUID  Final   Gram Stain   Final    MODERATE WBC PRESENT,BOTH PMN AND MONONUCLEAR NO ORGANISMS SEEN    Culture   Final    NO GROWTH 4 DAYS NO ANAEROBES ISOLATED; CULTURE IN PROGRESS FOR 5 DAYS   Report Status PENDING  Incomplete         Radiology Studies: Ct Chest Wo Contrast  Result Date: 01/30/2017 CLINICAL DATA:  Evaluate pleural effusion EXAM: CT CHEST WITHOUT CONTRAST TECHNIQUE: Multidetector CT imaging of the chest was performed following the standard protocol without IV contrast. COMPARISON:  01/24/2017 CT and 01/30/2017 CXR FINDINGS: Cardiovascular: PICC line tip terminates in the brachiocephalic confluence. Heart size is normal. No significant pericardial effusion. No aneurysm of the aorta. Mediastinum/Nodes: Midline trachea. No thyroid mass. No mediastinal adenopathy. Hilum is difficult to evaluate without IV contrast. Esophagus is within normal limits. Lungs/Pleura: Bilateral chest tubes are identified, one coiled along the base of the right lung and the second along the periphery of the left hemithorax. Small ex vacuo pneumothorax status post drainage of left pleural fluid is seen along the posterior aspect of the left lung along the major fissure with small amount of air noted overlying the left  lung apex. Slight interval increase in right pleural effusion since prior despite drainage catheter measuring 3.5 cm in thickness versus approximately 2.5 cm in thickness previously. Upper Abdomen: A percutaneous pigtail catheter is again seen in the left upper quadrant of the abdomen adjacent to the bypassed stomach with further decrease in abscess collection. Second right upper quadrant pigtail drainage catheter overlies the right hepatic lobe with smaller volume of crescentic fluid outlining the liver currently. Musculoskeletal: No acute nor suspicious osseous abnormality. IMPRESSION: 1. Bilateral chest tubes and upper abdominal drainage catheters in place as above. 2. There has been some interval increase in right-sided pleural effusion despite presence of a right basilar chest tube. Small pneumothorax ex vacuo status post drainage left pleural  effusion with smaller volume of pleural fluid seen. 3. Decrease in right perihepatic fluid collection since prior as well as adjacent to the bypassed stomach. Electronically Signed   By: Tollie Eth M.D.   On: 01/30/2017 20:54   Ct Abdomen Pelvis W Contrast  Result Date: 01/31/2017 CLINICAL DATA:  Abscess.  History of complicated gastric bypass. EXAM: CT ABDOMEN AND PELVIS WITH CONTRAST TECHNIQUE: Multidetector CT imaging of the abdomen and pelvis was performed using the standard protocol following bolus administration of intravenous contrast. CONTRAST:  ISOVUE-300 IOPAMIDOL (ISOVUE-300) INJECTION 61% COMPARISON:  01/27/2017 FINDINGS: Lower chest: There are small bilateral pleural effusions with subtle pleural thickening. A left-sided chest tube has been removed since chest CT yesterday. The effusions are not progressed since prior. Multi segment lower lobe atelectasis. Small loculated pneumothorax on the left. Hepatobiliary: No primary abnormality noted. Peri hepatic collection describedbelow. Negative biliary tree. Pancreas: Unremarkable. Spleen: No acute  finding.  No evidence of infarct. Adrenals/Urinary Tract: Negative adrenals. Patient has urolithiasis, but currently obscured by contrast excretion. No hydronephrosis. Small renal cysts. Unremarkable bladder. Stomach/Bowel: Status post gastric bypass. The collection interposed between the gastric remnant and pouch is resolved, with catheter in place. There is a right subphrenic collection extending along the right liver to the level of the paracolic gutter and pelvic inlet. This collection has catheters in the subphrenic and right lower quadrant spaces, both in continuity with the collection (septations could impede flow). The subphrenic collection is significantly decreased from prior, now 12 mm in thickness. Residual predominant pocket measures 16 cm in length by 4 cm in thickness in the paracolic gutter. Lateral left perisplenic collection is mildly increased from 01/19/2017, now 13 x 3 cm as compared to 13 x 2 cm previously. No convincing change from CT 01/27/2017. No new collection is seen. Oral contrast is distributed throughout the colon. None is seen within the collections. Moderate sigmoid diverticulosis. Negative for bowel obstruction. Vascular/Lymphatic: No acute vascular abnormality. Prominent atherosclerotic calcification for age. Reproductive:No pathologic findings. Other: No ascites or pneumoperitoneum. Musculoskeletal: No acute abnormalities. IMPRESSION: 1. Resolved collection between the gastric remnant and gastric pouch, with percutaneous catheter in place. 2. Apparently continuous collection extending from the right subphrenic space to paracolic gutter and right lower quadrant. Subphrenic catheter in good position with significantly decreased collection compared to 01/19/2017. Residual subphrenic collection measures up to 1.5 cm in thickness. The right paracolic gutter collection measures 16 cm in length by 4 cm in maximal thickness. 3. Left peri splenic/subphrenic collection is 13 cm craniocaudal  and 3 cm in thickness. Collection is mildly increased from 01/19/2017. 4. Small bilateral complex pleural effusion with multi segment atelectasis. Left-sided chest tube has been removed since CT yesterday. 5. No extravasation of oral contrast, which reached the colon. Electronically Signed   By: Marnee Spring M.D.   On: 01/31/2017 17:20   Dg Chest Port 1 View  Result Date: 02/01/2017 CLINICAL DATA:  Shortness of breath. EXAM: PORTABLE CHEST 1 VIEW COMPARISON:  01/30/2017 FINDINGS: Right arm PICC tip is in the subclavian vein at the SVC subclavian junction. Drainage catheter remains evident in the right upper quadrant. Left-sided drain is been removed. Bilateral effusions with atelectasis in both lower lungs. IMPRESSION: Bilateral effusions with atelectasis in both lower lungs. Electronically Signed   By: Paulina Fusi M.D.   On: 02/01/2017 09:43        Scheduled Meds: . acetaminophen  1,000 mg Oral Q8H  . chlorhexidine  15 mL Mouth Rinse BID  .  Chlorhexidine Gluconate Cloth  6 each Topical Daily  . enoxaparin (LOVENOX) injection  40 mg Subcutaneous Q12H  . hydrocortisone   Rectal BID  . insulin aspart  0-20 Units Subcutaneous TID WC  . mouth rinse  15 mL Mouth Rinse q12n4p  . methocarbamol  500 mg Oral TID  . pneumococcal 23 valent vaccine  0.5 mL Intramuscular Tomorrow-1000  . polyethylene glycol  17 g Oral Daily  . protein supplement shake  2 oz Oral QID  . sodium chloride flush  5 mL Intravenous Q8H  . sodium chloride flush  5 mL Intravenous Q8H  . sodium chloride flush  5 mL Intravenous Q8H   Continuous Infusions: . sodium chloride 35 mL/hr at 01/30/17 1631  . ertapenem Stopped (01/31/17 1918)  . lactated ringers 75 mL/hr at 01/31/17 2216  . metronidazole Stopped (02/01/17 4540)     LOS: 17 days    Dron Jaynie Collins, MD [facility] Pager [phone]  If 7PM-7AM, please contact night-coverage [email or web address] Password [email or web address] 02/01/2017, 10:34 AM

## 2017-02-01 NOTE — Progress Notes (Signed)
Subjective: Alert and stable. Afebrile.  Heart rate 103.excellent urine output. Left chest tube removed yesterday No nausea  CT scan yesterday shows resolution of fluid collection below anastomosis.  Minimal residual fluid perihepatic space with drain in place.  Left perisplenic, non-rim-enhancing fluid is essentially unchanged from CT scan dated 01/19/2017.  This does not look infected  Remains on Invanz,Flagyl and is actively followed by infectious disease due to initial cultures Right subphrenic cultures are negative. WBC 3900.  Hemoglobin stable at 8.4.  Creatinine 0.54  Objective: Vital signs in last 24 hours: Temp:  [98 F (36.7 C)-98.5 F (36.9 C)] 98.5 F (36.9 C) (09/16 0502) Pulse Rate:  [103-107] 103 (09/16 0502) Resp:  [16-18] 18 (09/16 0502) BP: (126-135)/(74-87) 135/87 (09/16 0502) SpO2:  [93 %-96 %] 96 % (09/16 0502) Weight:  [152.2 kg (335 lb 8 oz)] 152.2 kg (335 lb 8 oz) (09/16 0502) Last BM Date: 01/31/17  Intake/Output from previous day: 09/15 0701 - 09/16 0700 In: 953.8 [P.O.:180; I.V.:98.8; IV Piggyback:600] Out: 1525 [Urine:1400; Drains:125] Intake/Output this shift: No intake/output data recorded.    EXAM: General: Alert.  Friendly.  No distress Pulmonary: Effort normal.  Tender left chest wall tube insertion site but tube is gone and less tender.  Soft tissues look fine Abdomen: Obese.  Soft.  Minimal tenderness.  No cellulitis.  No mass Extremities: No erythema.  Mild bilateral LEedema   Lab Results:   Recent Labs  01/31/17 0449 02/01/17 0459  WBC 4.6 3.9*  HGB 8.2* 8.4*  HCT 27.3* 27.3*  PLT 392 390   BMET  Recent Labs  01/31/17 0449 02/01/17 0459  NA 134* 135  K 4.0 3.8  CL 100* 101  CO2 28 27  GLUCOSE 104* 98  BUN 8 6  CREATININE 0.59* 0.54*  CALCIUM 8.1* 8.1*   PT/INR No results for input(s): LABPROT, INR in the last 72 hours. ABG No results for input(s): PHART, HCO3 in the last 72 hours.  Invalid input(s): PCO2,  PO2  Studies/Results: Ct Chest Wo Contrast  Result Date: 01/30/2017 CLINICAL DATA:  Evaluate pleural effusion EXAM: CT CHEST WITHOUT CONTRAST TECHNIQUE: Multidetector CT imaging of the chest was performed following the standard protocol without IV contrast. COMPARISON:  01/24/2017 CT and 01/30/2017 CXR FINDINGS: Cardiovascular: PICC line tip terminates in the brachiocephalic confluence. Heart size is normal. No significant pericardial effusion. No aneurysm of the aorta. Mediastinum/Nodes: Midline trachea. No thyroid mass. No mediastinal adenopathy. Hilum is difficult to evaluate without IV contrast. Esophagus is within normal limits. Lungs/Pleura: Bilateral chest tubes are identified, one coiled along the base of the right lung and the second along the periphery of the left hemithorax. Small ex vacuo pneumothorax status post drainage of left pleural fluid is seen along the posterior aspect of the left lung along the major fissure with small amount of air noted overlying the left lung apex. Slight interval increase in right pleural effusion since prior despite drainage catheter measuring 3.5 cm in thickness versus approximately 2.5 cm in thickness previously. Upper Abdomen: A percutaneous pigtail catheter is again seen in the left upper quadrant of the abdomen adjacent to the bypassed stomach with further decrease in abscess collection. Second right upper quadrant pigtail drainage catheter overlies the right hepatic lobe with smaller volume of crescentic fluid outlining the liver currently. Musculoskeletal: No acute nor suspicious osseous abnormality. IMPRESSION: 1. Bilateral chest tubes and upper abdominal drainage catheters in place as above. 2. There has been some interval increase in right-sided pleural effusion despite  presence of a right basilar chest tube. Small pneumothorax ex vacuo status post drainage left pleural effusion with smaller volume of pleural fluid seen. 3. Decrease in right perihepatic  fluid collection since prior as well as adjacent to the bypassed stomach. Electronically Signed   By: Tollie Eth M.D.   On: 01/30/2017 20:54   Ct Abdomen Pelvis W Contrast  Result Date: 01/31/2017 CLINICAL DATA:  Abscess.  History of complicated gastric bypass. EXAM: CT ABDOMEN AND PELVIS WITH CONTRAST TECHNIQUE: Multidetector CT imaging of the abdomen and pelvis was performed using the standard protocol following bolus administration of intravenous contrast. CONTRAST:  ISOVUE-300 IOPAMIDOL (ISOVUE-300) INJECTION 61% COMPARISON:  01/27/2017 FINDINGS: Lower chest: There are small bilateral pleural effusions with subtle pleural thickening. A left-sided chest tube has been removed since chest CT yesterday. The effusions are not progressed since prior. Multi segment lower lobe atelectasis. Small loculated pneumothorax on the left. Hepatobiliary: No primary abnormality noted. Peri hepatic collection describedbelow. Negative biliary tree. Pancreas: Unremarkable. Spleen: No acute finding.  No evidence of infarct. Adrenals/Urinary Tract: Negative adrenals. Patient has urolithiasis, but currently obscured by contrast excretion. No hydronephrosis. Small renal cysts. Unremarkable bladder. Stomach/Bowel: Status post gastric bypass. The collection interposed between the gastric remnant and pouch is resolved, with catheter in place. There is a right subphrenic collection extending along the right liver to the level of the paracolic gutter and pelvic inlet. This collection has catheters in the subphrenic and right lower quadrant spaces, both in continuity with the collection (septations could impede flow). The subphrenic collection is significantly decreased from prior, now 12 mm in thickness. Residual predominant pocket measures 16 cm in length by 4 cm in thickness in the paracolic gutter. Lateral left perisplenic collection is mildly increased from 01/19/2017, now 13 x 3 cm as compared to 13 x 2 cm previously. No  convincing change from CT 01/27/2017. No new collection is seen. Oral contrast is distributed throughout the colon. None is seen within the collections. Moderate sigmoid diverticulosis. Negative for bowel obstruction. Vascular/Lymphatic: No acute vascular abnormality. Prominent atherosclerotic calcification for age. Reproductive:No pathologic findings. Other: No ascites or pneumoperitoneum. Musculoskeletal: No acute abnormalities. IMPRESSION: 1. Resolved collection between the gastric remnant and gastric pouch, with percutaneous catheter in place. 2. Apparently continuous collection extending from the right subphrenic space to paracolic gutter and right lower quadrant. Subphrenic catheter in good position with significantly decreased collection compared to 01/19/2017. Residual subphrenic collection measures up to 1.5 cm in thickness. The right paracolic gutter collection measures 16 cm in length by 4 cm in maximal thickness. 3. Left peri splenic/subphrenic collection is 13 cm craniocaudal and 3 cm in thickness. Collection is mildly increased from 01/19/2017. 4. Small bilateral complex pleural effusion with multi segment atelectasis. Left-sided chest tube has been removed since CT yesterday. 5. No extravasation of oral contrast, which reached the colon. Electronically Signed   By: Marnee Spring M.D.   On: 01/31/2017 17:20   Dg Chest Port 1 View  Result Date: 02/01/2017 CLINICAL DATA:  Shortness of breath. EXAM: PORTABLE CHEST 1 VIEW COMPARISON:  01/30/2017 FINDINGS: Right arm PICC tip is in the subclavian vein at the SVC subclavian junction. Drainage catheter remains evident in the right upper quadrant. Left-sided drain is been removed. Bilateral effusions with atelectasis in both lower lungs. IMPRESSION: Bilateral effusions with atelectasis in both lower lungs. Electronically Signed   By: Paulina Fusi M.D.   On: 02/01/2017 09:43    Anti-infectives: Anti-infectives    Start  Dose/Rate Route Frequency  Ordered Stop   01/27/17 1900  ertapenem (INVANZ) 1 g in sodium chloride 0.9 % 50 mL IVPB     1 g 100 mL/hr over 30 Minutes Intravenous Every 24 hours 01/27/17 1836     01/23/17 1500  metroNIDAZOLE (FLAGYL) IVPB 500 mg     500 mg 100 mL/hr over 60 Minutes Intravenous Every 8 hours 01/23/17 1353     01/23/17 1500  ceFEPIme (MAXIPIME) 2 g in dextrose 5 % 50 mL IVPB  Status:  Discontinued     2 g 100 mL/hr over 30 Minutes Intravenous Every 8 hours 01/23/17 1357 01/27/17 1835   01/22/17 2000  vancomycin (VANCOCIN) 1,250 mg in sodium chloride 0.9 % 250 mL IVPB  Status:  Discontinued     1,250 mg 166.7 mL/hr over 90 Minutes Intravenous Every 8 hours 01/22/17 1202 01/23/17 1358   01/22/17 1230  vancomycin (VANCOCIN) 2,500 mg in sodium chloride 0.9 % 500 mL IVPB     2,500 mg 250 mL/hr over 120 Minutes Intravenous  Once 01/22/17 1147 01/22/17 1438   01/15/17 1815  piperacillin-tazobactam (ZOSYN) IVPB 3.375 g  Status:  Discontinued     3.375 g 12.5 mL/hr over 240 Minutes Intravenous Every 8 hours 01/15/17 1804 01/15/17 1804   01/15/17 1630  piperacillin-tazobactam (ZOSYN) IVPB 3.375 g  Status:  Discontinued     3.375 g 12.5 mL/hr over 240 Minutes Intravenous Every 8 hours 01/15/17 1616 01/23/17 1354      Assessment/Plan:   Status post Roux-en-Y/gastric bypass,12/08/16, Dr. Clent Ridges Leak at gastrojejunostomy with multiple abscesses. -S/P perc drain 01/16/17 >>cx RARE GARDNERELLA VAGINALIS -S/p IR drain 01/21/17 >>cs ABUNDANT CITROBACTER FREUNDII,FEW VIRIDANS STREPTOCOCCUS -S/p IR thoracentesis 01/23/17 =>>cx no organisms -S/p IR Perc drain R perihepatic abscess 9/11 >> cx NGTD -S/p IR perc drain L chest exudative effusion 9/11 >>cx NGTD. S/p thrombolytic instillation via CT 9/13 and 9/14. -all things considered, he looks pretty good.  FEN - bariatric clear liquids VTE - SCDs, lovenox ID - Zosyn 8/30>>01/23/17;Vancomycin 9/6>>01/23/17;Cefepime 9/7>>01/27/17; Flagyl 9/7>>8days; Invanz  9/11>>Day#4 Pain - scheduled robaxin, tylenol. PRN dilaudid and oxycodone.  Plan: D/c TNA and continue bariatric clears/protein shakes. Wean pain medication. Continue abdominal drains. Follow cultures. CT  - out.  Clinically and surgically he is stable.  I think he could be discharged on oral antibiotics once infectious disease selects the appropriate antibiotics and the duration of therapy.    abdominal drains can be managed by Dr. Sheliah Hatch in the office. Appreciate ID recommendations on antibiotics.  hope to discharge on Monday.   LOS: 17 days    Mercedes Fort M 02/01/2017

## 2017-02-01 NOTE — Progress Notes (Signed)
Physical Therapy Evaluation Patient Details Name: Albert White MRN: [id number] DOB: 01-28-1976 Today's Date: 02/01/2017   History of Present Illness  Pt is a 41 year old male with history of type 2 diabetes, dyslipidemia, obstructive sleep apnea, Roux-en-Y/gastric bypass on 12/08/16 in [place] by Dr. Franki Cabot, rehospitalized 12/30/16-01/10/17 for anasarca, abdominal pain, fever of 103F and postoperative seroma for which a percutaneous drain was placed. After discharge, patient continued to have abdominal pain associated with nausea, emesis and decreased appetite. Outpatient CT of the abdomen revealed 10.7 x 5 cm abscess abutting the posterior margin of the GJ anastomosis worrisome for dehiscence, diffuse peritonitis with large gas containing abscess throughout the peritoneal cavity including a right perihepatic abscess compressing the right liver lobe and provoking anileus. Patient was admitted to the hospital with the working diagnosis of abdominal sepsis due to peritonitis, and intra-abdominal abscess related to a postoperative complication of Roux-en-Y/gastric bypass (12/08/16).  Clinical Impression  Pt presents with above diagnosis. Patient is able to perform all activities, including stairs, transfers, and ambulation, with Mod I and only required assistance with management of lines. Patient's only complaint was fatigue consistent with the exertion required during session. Patient is safe for d/c from a mobility standpoint. Patient does not require PT at this time and will continue to benefit from ambulating with mobility tech. PT should be reconsulted if mobility status of patient declines.    Follow Up Recommendations No PT follow up;Supervision - Intermittent    Equipment Recommendations  None recommended by PT    Recommendations for Other Services       Precautions / Restrictions Precautions Precautions: None Precaution Comments: drains x3 in abdomen Restrictions Weight  Bearing Restrictions: No      Mobility  Bed Mobility               General bed mobility comments: Patient was seated in chair upon arrival.  Transfers Overall transfer level: Independent Equipment used: None                Ambulation/Gait Ambulation/Gait assistance: Modified independent (Device/Increase time) Ambulation Distance (Feet): 400 Feet Assistive device: None Gait Pattern/deviations: Step-through pattern;Decreased step length - right;Decreased step length - left Gait velocity: decreased  Gait velocity interpretation: Below normal speed for age/gender General Gait Details: Patient amb. at decreased speed and increased lateral sway. (Simultaneous filing. User may not have seen previous data.)  Stairs Stairs: Yes Stairs assistance: Modified independent (Device/Increase time) Stair Management: One rail Right Number of Stairs: 5 (completed 2 times) General stair comments: Mod i due to need for management of lines.  Wheelchair Mobility    Modified Rankin (Stroke Patients Only)       Balance Overall balance assessment: No apparent balance deficits (not formally assessed)                               Standardized Balance Assessment Standardized Balance Assessment : Dynamic Gait Index   Dynamic Gait Index Level Surface: Normal Gait with Horizontal Head Turns: Mild Impairment Gait with Vertical Head Turns: Normal Steps: Normal       Pertinent Vitals/Pain Pain Assessment: Faces Faces Pain Scale: Hurts a little bit Pain Location: Abdomen Pain Descriptors / Indicators: Operative site guarding;Discomfort Pain Intervention(s): Limited activity within patient's tolerance;Monitored during session;Repositioned    Home Living Family/patient expects to be discharged to:: Private residence Living Arrangements: Spouse/significant other Available Help at Discharge: Family Type of Home: House Home Access: Stairs to  enter Entrance  Stairs-Rails: Right Entrance Stairs-Number of Steps: 2 Home Layout: One level Home Equipment: None      Prior Function Level of Independence: Independent         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[employer], counselling   Dominant Hand: Right    Extremity/Trunk Assessment   Upper Extremity Assessment Upper Extremity Assessment: Defer to OT evaluation    Lower Extremity Assessment Lower Extremity Assessment: Overall WFL for tasks assessed    Cervical / Trunk Assessment Cervical / Trunk Assessment: Normal  Communication   Communication: No difficulties  Cognition Arousal/Alertness: Awake/alert Behavior During Therapy: WFL for tasks assessed/performed Overall Cognitive Status: Within Functional Limits for tasks assessed                                        General Comments      Exercises     Assessment/Plan    PT Assessment Patent does not need any further PT services  PT Problem List         PT Treatment Interventions      PT Goals (Current goals can be found in the Care Plan section)  Acute Rehab PT Goals Patient Stated Goal: Home to family PT Goal Formulation: All assessment and education complete, DC therapy    Frequency     Barriers to discharge        Co-evaluation               AM-PAC PT "6 Clicks" Daily Activity  Outcome Measure Difficulty turning over in bed (including adjusting bedclothes, sheets and blankets)?: None Difficulty moving from lying on back to sitting on the side of the bed? : None Difficulty sitting down on and standing up from a chair with arms (e.g., wheelchair, bedside commode, etc,.)?: None Help needed moving to and from a bed to chair (including a wheelchair)?: None Help needed walking in hospital room?: None Help needed climbing 3-5 steps with a railing? : None 6 Click Score: 24    End of Session Equipment Utilized During Treatment: Gait belt Activity Tolerance: Patient tolerated treatment  well Patient left: in chair;with call bell/phone within reach Nurse Communication: Mobility status PT Visit Diagnosis: Pain Pain - part of body:  (abdomen)    Time: 5409-8119 PT Time Calculation (min) (ACUTE ONLY): 19 min   Charges:   PT Evaluation $PT Eval Low Complexity: 1 Low     PT G Codes:        Layia Walla SPT  Vauda Salvucci 02/01/2017, 3:31 PM

## 2017-02-02 LAB — BASIC METABOLIC PANEL
ANION GAP: 7 (ref 5–15)
BUN: <5 mg/dL — ABNORMAL LOW (ref 6–20)
CO2: 28 mmol/L (ref 22–32)
Calcium: 8 mg/dL — ABNORMAL LOW (ref 8.9–10.3)
Chloride: 102 mmol/L (ref 101–111)
Creatinine, Ser: 0.52 mg/dL — ABNORMAL LOW (ref 0.61–1.24)
Glucose, Bld: 104 mg/dL — ABNORMAL HIGH (ref 65–99)
POTASSIUM: 3.8 mmol/L (ref 3.5–5.1)
Sodium: 137 mmol/L (ref 135–145)

## 2017-02-02 LAB — GLUCOSE, CAPILLARY
GLUCOSE-CAPILLARY: 96 mg/dL (ref 65–99)
GLUCOSE-CAPILLARY: 87 mg/dL (ref 65–99)
GLUCOSE-CAPILLARY: 99 mg/dL (ref 65–99)
Glucose-Capillary: 102 mg/dL — ABNORMAL HIGH (ref 65–99)

## 2017-02-02 LAB — MAGNESIUM: MAGNESIUM: 1.7 mg/dL (ref 1.7–2.4)

## 2017-02-02 MED ORDER — OXYCODONE HCL 10 MG PO TABS: 10.0000 mg | ORAL_TABLET | Freq: Three times a day (TID) | ORAL | 0 refills | Status: DC | PRN

## 2017-02-02 MED ORDER — LEVOFLOXACIN 750 MG PO TABS
750.0000 mg | ORAL_TABLET | ORAL | Status: DC
  Administered 2017-02-02: 750 mg via ORAL
  Filled 2017-02-02: qty 1

## 2017-02-02 MED ORDER — METHOCARBAMOL 500 MG PO TABS: 500.0000 mg | ORAL_TABLET | Freq: Three times a day (TID) | ORAL | 0 refills | Status: DC | PRN

## 2017-02-02 MED ORDER — MAGNESIUM SULFATE 2 GM/50ML IV SOLN
2.0000 g | Freq: Once | INTRAVENOUS | Status: AC
  Administered 2017-02-02: 2 g via INTRAVENOUS
  Filled 2017-02-02: qty 50

## 2017-02-02 NOTE — Progress Notes (Addendum)
PROGRESS NOTE    Albert White  WUJ:[id number] DOB: 10-Aug-1975 DOA: 01/15/2017 PCP: Alysia Penna, MD   Brief Narrative: 41 year old male with history of type 2 diabetes, dyslipidemia, obstructive sleep apnea, Roux-en-Y/gastric bypass on 12/08/16 in [place] by Dr. Franki Cabot, rehospitalized 12/30/16-01/10/17 for anasarca, abdominal pain, fever of 103F and postoperative seroma for which a percutaneous drain was placed. After discharge from hospital patient continued to have abdominal pain, associated with nausea, emesis and decreased appetite, outpatient CT of the abdomen showedgas containing 10.7 x 5 cm abscess abutting the posterior margin of the GJ anastomosis worrisome for dehiscence, diffuse peritonitis with large gas containing abscess throughout the peritoneal cavity including a right perihepatic abscess compressing the right liver lobe and provoking anileus, findings that prompted rehospitalization.   Patient was admitted to the hospital with the working diagnosis of abdominal sepsis due to peritonitis, and intra-abdominal abscess related to a postoperative complication of Roux-en-Y/gastric bypass (12/08/16).  Assessment & Plan:  # Sepsis due to peritonitis and multiple intra-abdominal abscess, colitis - s/p RLQ drain placement on 01/16/17, peri-epigastric drain placement 9/5  and hepatic abscess drain and chest tube placement by 9/11. -Currently on Invanz, Flagyl as per infectious disease. Follow up infectious disease for further antibiotic. Culture from September 11 negative. -Patient is tolerating bariatric diet. Repeat CT scan of abdomen and pelvis with resolving intra-abdominal collection and left perisplenic fluid is essentially unchanged from before as per general surgery. Stable as per general surgery team. -Plan to discontinue IV Dilaudid today and monitor with oral medication. Follow-up  multiple consultants including surgery, infectious disease, IR for discharge planning.  Likely discharge home in 1-2 days.  # Pain management: Patient reported pain is at the site of left chest tube. Discontinued IV Dilaudid and currently on oral oxycodone as needed. Continue bowel regimen. Monitor off IV pain medication. Discussed with the patient and his wife at bedside.   #Pleural effusion: CT scan with a moderate left pleural effusion with loculation status post chest tube placement. CT surgery following. -Status post lytic treatment by pulmonologist. Removal of chest tube on 9/15. Pulmonary and CTVS consult appreciated. --repeat CXR with congestion, echo unremarkable except mild concentric hypertrophy.  #Type 2 diabetes: Monitor blood sugar level. Continue sliding scale.  #Obstructive sleep apnea: Continue CPAP. Continue incentive spirometer  #Anemia multifactorial etiology, likely due to chronic disease: Monitor CBC. No sign of bleeding. Hemoglobin is stable  #History of morbid obesity  #Hypomagnesemia: Repleted magnesium sulfate.  Repeat lab in the morning.  Patient with multiple surgical issues and related complications. Managed by multiple specialists including infectious disease, general surgery, interventional radiology, pulmonologist, cardiothoracic surgeon, alll appreciated.   Follow-up IR for all abdominal drain. Pulmonary following for a chest tube.  Active Problems:   Postprocedural intraabdominal abscess   Pleural effusion   Peritonitis (HCC)   Gastrointestinal anastomotic leak   Acute pulmonary edema (HCC)   Hypomagnesemia   Pain management  DVT prophylaxis: Lovenox subcutaneous Code Status: Full code Family Communication: No family member at bedside today. Disposition Plan: Currently admitted. Likely discharge home in 1-2 days depending on INR, infectious disease and pain control.    Consultants:   General surgery  IR  Infectious disease  Pulmonologist  Cardiothoracic surgery  Pharmacist  Procedures: Multiple drain  placement Antimicrobials: Currently on Invanz and Flagyl  Subjective: Seen and examined at bedside. Pain is better therefore planning to discontinue IV pain medicine to observe. Tolerating diet well. Denied nausea vomiting. Wife at bedside. Objective: Vitals:  02/01/17 1302 02/01/17 1500 02/01/17 2155 02/02/17 0537  BP: 121/73 132/76 128/82 121/74  Pulse: (!) 104 (!) 108 (!) 104 (!) 107  Resp: Temp: 98.2 F (36.8 C) 98.2 F (36.8 C) 98.3 F (36.8 C) 98 F (36.7 C)  TempSrc: Oral Oral Oral Oral  SpO2: 96% 94% 95%   Weight:    (!) 147.3 kg (324 lb 12.8 oz)  Height:        Intake/Output Summary (Last 24 hours) at 02/02/17 1322 Last data filed at 02/02/17 1150  Gross per 24 hour  Intake             1910 ml  Output             1650 ml  Net              260 ml   Filed Weights   01/31/17 0638 02/01/17 0502 02/02/17 0537  Weight: (!) 150.6 kg (332 lb) (!) 152.2 kg (335 lb 8 oz) (!) 147.3 kg (324 lb 12.8 oz)    Examination:  General exam: Not in distress, sitting on chair comfortable Respiratory system: Bibasal decreased breath sound, no wheezing or crackle Cardiovascular system: Regular rate rhythm, S1-S2 normal.. Gastrointestinal system: Abdomen soft, nontender, mild distention. Bowel sound positive. Has 3 drains Central nervous system: Alert awake and following commands. Skin: No rashes, lesions or ulcers Psychiatry: Judgement and insight appear normal. Mood & affect appropriate.     Data Reviewed: I have personally reviewed following labs and imaging studies  CBC:  Recent Labs Lab 01/27/17 0551 01/31/17 0449 02/01/17 0459  WBC 5.4 4.6 3.9*  HGB 8.2* 8.2* 8.4*  HCT 27.3* 27.3* 27.3*  MCV 85.3 84.3 84.5  PLT 444* 392 390   Basic Metabolic Panel:  Recent Labs Lab 01/27/17 0551 01/29/17 0405 01/31/17 0449 02/01/17 0459 02/02/17 0429  NA 133* 133* 134* 135 137  K 4.0 4.0 4.0 3.8 3.8  CL 98* 98* 100* 101 102  CO2 GLUCOSE  158* 131* 104* 98 104*  BUN <5*  CREATININE 0.48* 0.46* 0.59* 0.54* 0.52*  CALCIUM 8.1* 8.2* 8.1* 8.1* 8.0*  MG 1.7 1.5* 1.5* 1.5* 1.7  PHOS 4.4 4.5  --   --   --    GFR: Estimated Creatinine Clearance: 178.3 mL/min (A) (by C-G formula based on SCr of 0.52 mg/dL (L)). Liver Function Tests:  Recent Labs Lab 01/27/17 1937 01/29/17 0405  AST  --  17  ALT  --  10*  ALKPHOS  --  83  BILITOT  --  0.2*  PROT  --  6.0*  ALBUMIN 1.7* 1.8*   No results for input(s): LIPASE, AMYLASE in the last 168 hours. No results for input(s): AMMONIA in the last 168 hours. Coagulation Profile: No results for input(s): INR, PROTIME in the last 168 hours. Cardiac Enzymes: No results for input(s): CKTOTAL, CKMB, CKMBINDEX, TROPONINI in the last 168 hours. BNP (last 3 results) No results for input(s): PROBNP in the last 8760 hours. HbA1C: No results for input(s): HGBA1C in the last 72 hours. CBG:  Recent Labs Lab 02/01/17 1213 02/01/17 1659 02/01/17 2152 02/02/17 0745 02/02/17 1220  GLUCAP 133* 91 119* 102* 96   Lipid Profile: No results for input(s): CHOL, HDL, LDLCALC, TRIG, CHOLHDL, LDLDIRECT in the last 72 hours. Thyroid Function Tests: No results for input(s): TSH, T4TOTAL, FREET4, T3FREE, THYROIDAB in the last 72 hours. Anemia Panel: No results  for input(s): VITAMINB12, FOLATE, FERRITIN, TIBC, IRON, RETICCTPCT in the last 72 hours. Sepsis Labs: No results for input(s): PROCALCITON, LATICACIDVEN in the last 168 hours.  Recent Results (from the past 240 hour(s))  Aerobic/Anaerobic Culture (surgical/deep wound)     Status: None   Collection Time: 01/27/17  1:58 PM  Result Value Ref Range Status   Specimen Description ABSCESS PLEURAL LEFT  Final   Special Requests SYRINGE  Final   Gram Stain   Final    RARE WBC PRESENT,BOTH PMN AND MONONUCLEAR NO ORGANISMS SEEN    Culture No growth aerobically or anaerobically.  Final   Report Status 02/01/2017 FINAL  Final   Aerobic/Anaerobic Culture (surgical/deep wound)     Status: None   Collection Time: 01/27/17  2:34 PM  Result Value Ref Range Status   Specimen Description ABDOMEN  Final   Special Requests RIGHT PERIHEPATIC FLUID  Final   Gram Stain   Final    MODERATE WBC PRESENT,BOTH PMN AND MONONUCLEAR NO ORGANISMS SEEN    Culture No growth aerobically or anaerobically.  Final   Report Status 02/01/2017 FINAL  Final         Radiology Studies: Ct Abdomen Pelvis W Contrast  Result Date: 01/31/2017 CLINICAL DATA:  Abscess.  History of complicated gastric bypass. EXAM: CT ABDOMEN AND PELVIS WITH CONTRAST TECHNIQUE: Multidetector CT imaging of the abdomen and pelvis was performed using the standard protocol following bolus administration of intravenous contrast. CONTRAST:  ISOVUE-300 IOPAMIDOL (ISOVUE-300) INJECTION 61% COMPARISON:  01/27/2017 FINDINGS: Lower chest: There are small bilateral pleural effusions with subtle pleural thickening. A left-sided chest tube has been removed since chest CT yesterday. The effusions are not progressed since prior. Multi segment lower lobe atelectasis. Small loculated pneumothorax on the left. Hepatobiliary: No primary abnormality noted. Peri hepatic collection describedbelow. Negative biliary tree. Pancreas: Unremarkable. Spleen: No acute finding.  No evidence of infarct. Adrenals/Urinary Tract: Negative adrenals. Patient has urolithiasis, but currently obscured by contrast excretion. No hydronephrosis. Small renal cysts. Unremarkable bladder. Stomach/Bowel: Status post gastric bypass. The collection interposed between the gastric remnant and pouch is resolved, with catheter in place. There is a right subphrenic collection extending along the right liver to the level of the paracolic gutter and pelvic inlet. This collection has catheters in the subphrenic and right lower quadrant spaces, both in continuity with the collection (septations could impede flow). The  subphrenic collection is significantly decreased from prior, now 12 mm in thickness. Residual predominant pocket measures 16 cm in length by 4 cm in thickness in the paracolic gutter. Lateral left perisplenic collection is mildly increased from 01/19/2017, now 13 x 3 cm as compared to 13 x 2 cm previously. No convincing change from CT 01/27/2017. No new collection is seen. Oral contrast is distributed throughout the colon. None is seen within the collections. Moderate sigmoid diverticulosis. Negative for bowel obstruction. Vascular/Lymphatic: No acute vascular abnormality. Prominent atherosclerotic calcification for age. Reproductive:No pathologic findings. Other: No ascites or pneumoperitoneum. Musculoskeletal: No acute abnormalities. IMPRESSION: 1. Resolved collection between the gastric remnant and gastric pouch, with percutaneous catheter in place. 2. Apparently continuous collection extending from the right subphrenic space to paracolic gutter and right lower quadrant. Subphrenic catheter in good position with significantly decreased collection compared to 01/19/2017. Residual subphrenic collection measures up to 1.5 cm in thickness. The right paracolic gutter collection measures 16 cm in length by 4 cm in maximal thickness. 3. Left peri splenic/subphrenic collection is 13 cm craniocaudal and 3 cm in thickness.  Collection is mildly increased from 01/19/2017. 4. Small bilateral complex pleural effusion with multi segment atelectasis. Left-sided chest tube has been removed since CT yesterday. 5. No extravasation of oral contrast, which reached the colon. Electronically Signed   By: Marnee Spring M.D.   On: 01/31/2017 17:20   Dg Chest Port 1 View  Result Date: 02/01/2017 CLINICAL DATA:  Shortness of breath. EXAM: PORTABLE CHEST 1 VIEW COMPARISON:  01/30/2017 FINDINGS: Right arm PICC tip is in the subclavian vein at the SVC subclavian junction. Drainage catheter remains evident in the right upper quadrant.  Left-sided drain is been removed. Bilateral effusions with atelectasis in both lower lungs. IMPRESSION: Bilateral effusions with atelectasis in both lower lungs. Electronically Signed   By: Paulina Fusi M.D.   On: 02/01/2017 09:43        Scheduled Meds: . acetaminophen  1,000 mg Oral Q8H  . chlorhexidine  15 mL Mouth Rinse BID  . Chlorhexidine Gluconate Cloth  6 each Topical Daily  . enoxaparin (LOVENOX) injection  40 mg Subcutaneous Q12H  . hydrocortisone   Rectal BID  . insulin aspart  0-20 Units Subcutaneous TID WC  . mouth rinse  15 mL Mouth Rinse q12n4p  . methocarbamol  500 mg Oral TID  . pneumococcal 23 valent vaccine  0.5 mL Intramuscular Tomorrow-1000  . polyethylene glycol  17 g Oral Daily  . protein supplement shake  2 oz Oral QID  . sodium chloride flush  5 mL Intravenous Q8H  . sodium chloride flush  5 mL Intravenous Q8H  . sodium chloride flush  5 mL Intravenous Q8H   Continuous Infusions: . ertapenem Stopped (02/01/17 1819)  . lactated ringers 75 mL/hr at 02/02/17 1223  . metronidazole Stopped (02/02/17 0743)     LOS: 18 days    Shauniece Kwan Jaynie Collins, MD [facility] Pager [phone]  If 7PM-7AM, please contact night-coverage [email or web address] Password [email or web address] 02/02/2017, 1:22 PM

## 2017-02-02 NOTE — Progress Notes (Addendum)
Subjective: He feels fine and wants to go home. Afebrile.  Heart rate 107.  SPO2 95% on room air.excellent urine output  All drains functioning and draining small to moderate amounts. Abdominal fluid cultures from 9/11 are negative Pleural fluid cultures from 9/7 are negative Abdominal fluid cultures from 9/5 are polymicrobial, as discussed Remains on IV Invanz and IV Flagyl Await ID advice regarding outpatient management  Objective: Vital signs in last 24 hours: Temp:  [98 F (36.7 C)-98.3 F (36.8 C)] 98 F (36.7 C) (09/17 0537) Pulse Rate:  [104-108] 107 (09/17 0537) Resp:  [18] 18 (09/17 0537) BP: (121-132)/(73-82) 121/74 (09/17 0537) SpO2:  [94 %-96 %] 95 % (09/16 2155) Weight:  [147.3 kg (324 lb 12.8 oz)] 147.3 kg (324 lb 12.8 oz) (09/17 0537) Last BM Date: 02/02/17  Intake/Output from previous day: 09/16 0701 - 09/17 0700 In: 1670 [P.O.:180; I.V.:1425; IV Piggyback:50] Out: 1675 [Urine:1500; Drains:175] Intake/Output this shift: Total I/O In: 240 [P.O.:240] Out: 475 [Urine:475]    EXAM: General: Alert.  Friendly.  No distress Pulmonary: Effort normal.  Tender left chest wall tube insertion site but tube is gone and less tender.  Soft tissues look fine Abdomen: Obese.  Soft.  Minimal tenderness.  No cellulitis.  No mass.. RUQ drain a little cloudy.  Lower abdominal drain more serous.  Epigastric drain blood-tinged.  No gross purulence or enteric content from any drain Extremities: No erythema.  Mild bilateral LEedema   Lab Results:   Recent Labs  01/31/17 0449 02/01/17 0459  WBC 4.6 3.9*  HGB 8.2* 8.4*  HCT 27.3* 27.3*  PLT 392 390   BMET  Recent Labs  02/01/17 0459 02/02/17 0429  NA 135 137  K 3.8 3.8  CL 101 102  CO2 27 28  GLUCOSE 98 104*  BUN 6 <5*  CREATININE 0.54* 0.52*  CALCIUM 8.1* 8.0*   PT/INR No results for input(s): LABPROT, INR in the last 72 hours. ABG No results for input(s): PHART, HCO3 in the last 72 hours.  Invalid  input(s): PCO2, PO2  Studies/Results: Ct Abdomen Pelvis W Contrast  Result Date: 01/31/2017 CLINICAL DATA:  Abscess.  History of complicated gastric bypass. EXAM: CT ABDOMEN AND PELVIS WITH CONTRAST TECHNIQUE: Multidetector CT imaging of the abdomen and pelvis was performed using the standard protocol following bolus administration of intravenous contrast. CONTRAST:  ISOVUE-300 IOPAMIDOL (ISOVUE-300) INJECTION 61% COMPARISON:  01/27/2017 FINDINGS: Lower chest: There are small bilateral pleural effusions with subtle pleural thickening. A left-sided chest tube has been removed since chest CT yesterday. The effusions are not progressed since prior. Multi segment lower lobe atelectasis. Small loculated pneumothorax on the left. Hepatobiliary: No primary abnormality noted. Peri hepatic collection describedbelow. Negative biliary tree. Pancreas: Unremarkable. Spleen: No acute finding.  No evidence of infarct. Adrenals/Urinary Tract: Negative adrenals. Patient has urolithiasis, but currently obscured by contrast excretion. No hydronephrosis. Small renal cysts. Unremarkable bladder. Stomach/Bowel: Status post gastric bypass. The collection interposed between the gastric remnant and pouch is resolved, with catheter in place. There is a right subphrenic collection extending along the right liver to the level of the paracolic gutter and pelvic inlet. This collection has catheters in the subphrenic and right lower quadrant spaces, both in continuity with the collection (septations could impede flow). The subphrenic collection is significantly decreased from prior, now 12 mm in thickness. Residual predominant pocket measures 16 cm in length by 4 cm in thickness in the paracolic gutter. Lateral left perisplenic collection is mildly increased from 01/19/2017, now  13 x 3 cm as compared to 13 x 2 cm previously. No convincing change from CT 01/27/2017. No new collection is seen. Oral contrast is distributed throughout the  colon. None is seen within the collections. Moderate sigmoid diverticulosis. Negative for bowel obstruction. Vascular/Lymphatic: No acute vascular abnormality. Prominent atherosclerotic calcification for age. Reproductive:No pathologic findings. Other: No ascites or pneumoperitoneum. Musculoskeletal: No acute abnormalities. IMPRESSION: 1. Resolved collection between the gastric remnant and gastric pouch, with percutaneous catheter in place. 2. Apparently continuous collection extending from the right subphrenic space to paracolic gutter and right lower quadrant. Subphrenic catheter in good position with significantly decreased collection compared to 01/19/2017. Residual subphrenic collection measures up to 1.5 cm in thickness. The right paracolic gutter collection measures 16 cm in length by 4 cm in maximal thickness. 3. Left peri splenic/subphrenic collection is 13 cm craniocaudal and 3 cm in thickness. Collection is mildly increased from 01/19/2017. 4. Small bilateral complex pleural effusion with multi segment atelectasis. Left-sided chest tube has been removed since CT yesterday. 5. No extravasation of oral contrast, which reached the colon. Electronically Signed   By: Marnee Spring M.D.   On: 01/31/2017 17:20   Dg Chest Port 1 View  Result Date: 02/01/2017 CLINICAL DATA:  Shortness of breath. EXAM: PORTABLE CHEST 1 VIEW COMPARISON:  01/30/2017 FINDINGS: Right arm PICC tip is in the subclavian vein at the SVC subclavian junction. Drainage catheter remains evident in the right upper quadrant. Left-sided drain is been removed. Bilateral effusions with atelectasis in both lower lungs. IMPRESSION: Bilateral effusions with atelectasis in both lower lungs. Electronically Signed   By: Paulina Fusi M.D.   On: 02/01/2017 09:43    Anti-infectives: Anti-infectives    Start     Dose/Rate Route Frequency Ordered Stop   01/27/17 1900  ertapenem (INVANZ) 1 g in sodium chloride 0.9 % 50 mL IVPB     1 g 100 mL/hr  over 30 Minutes Intravenous Every 24 hours 01/27/17 1836     01/23/17 1500  metroNIDAZOLE (FLAGYL) IVPB 500 mg     500 mg 100 mL/hr over 60 Minutes Intravenous Every 8 hours 01/23/17 1353     01/23/17 1500  ceFEPIme (MAXIPIME) 2 g in dextrose 5 % 50 mL IVPB  Status:  Discontinued     2 g 100 mL/hr over 30 Minutes Intravenous Every 8 hours 01/23/17 1357 01/27/17 1835   01/22/17 2000  vancomycin (VANCOCIN) 1,250 mg in sodium chloride 0.9 % 250 mL IVPB  Status:  Discontinued     1,250 mg 166.7 mL/hr over 90 Minutes Intravenous Every 8 hours 01/22/17 1202 01/23/17 1358   01/22/17 1230  vancomycin (VANCOCIN) 2,500 mg in sodium chloride 0.9 % 500 mL IVPB     2,500 mg 250 mL/hr over 120 Minutes Intravenous  Once 01/22/17 1147 01/22/17 1438   01/15/17 1815  piperacillin-tazobactam (ZOSYN) IVPB 3.375 g  Status:  Discontinued     3.375 g 12.5 mL/hr over 240 Minutes Intravenous Every 8 hours 01/15/17 1804 01/15/17 1804   01/15/17 1630  piperacillin-tazobactam (ZOSYN) IVPB 3.375 g  Status:  Discontinued     3.375 g 12.5 mL/hr over 240 Minutes Intravenous Every 8 hours 01/15/17 1616 01/23/17 1354      Assessment/Plan:     Status post Roux-en-Y/gastric bypass,12/08/16, Dr. Clent Ridges Leak at gastrojejunostomy with multiple abscesses. -S/P perc drain 01/16/17 >>cx RARE GARDNERELLA VAGINALIS -S/p IR drain 01/21/17 >>cs ABUNDANT CITROBACTER FREUNDII,FEW VIRIDANS STREPTOCOCCUS -S/p IR thoracentesis 01/23/17 =>>cx no organisms -S/p IR Perc drain  R perihepatic abscess 9/11 >> cx NGTD -S/p IR perc drain L chest exudative effusion 9/11 >>cx NGTD. S/p thrombolytic instillation via CT 9/13 and 9/14. -all things considered, he looks pretty good.  FEN - bariatric clear liquids VTE - SCDs, lovenox ID - Zosyn 8/30>>01/23/17;Vancomycin 9/6>>01/23/17;Cefepime 9/7>>01/27/17; Flagyl 9/7>>8days; Invanz 9/11>>Day#4 Pain - scheduled robaxin, tylenol. PRN dilaudid and oxycodone.  Plan: continue bariatric  clears/protein shakes.   Continue abdominal drains.  Follow cultures. CT  - out.  Clinically and surgically he is stable. From a surgical standpoint he could be discharged on oral antibiotics  Recommend discharge home today once infectious disease decides regarding appropriate outpatient therapy and duration I have discussed drain care and record keeping with the patient and his wife  He will follow-up with Dr. Sheliah Hatch in 2 weeks Will need follow up IR drain clinic as well     LOS: 18 days    Khylie Larmore M 02/02/2017

## 2017-02-02 NOTE — Progress Notes (Signed)
INFECTIOUS DISEASE PROGRESS NOTE  ID: Albert White is a 41 y.o. male with  Active Problems:   Postprocedural intraabdominal abscess   Pleural effusion   Peritonitis (HCC)   Gastrointestinal anastomotic leak   Acute pulmonary edema (HCC)   Hypomagnesemia   Pain management  Subjective: No complaints, pain better. Only 3 drains now.   Abtx:  Anti-infectives    Start     Dose/Rate Route Frequency Ordered Stop   01/27/17 1900  ertapenem (INVANZ) 1 g in sodium chloride 0.9 % 50 mL IVPB     1 g 100 mL/hr over 30 Minutes Intravenous Every 24 hours 01/27/17 1836     01/23/17 1500  metroNIDAZOLE (FLAGYL) IVPB 500 mg     500 mg 100 mL/hr over 60 Minutes Intravenous Every 8 hours 01/23/17 1353     01/23/17 1500  ceFEPIme (MAXIPIME) 2 g in dextrose 5 % 50 mL IVPB  Status:  Discontinued     2 g 100 mL/hr over 30 Minutes Intravenous Every 8 hours 01/23/17 1357 01/27/17 1835   01/22/17 2000  vancomycin (VANCOCIN) 1,250 mg in sodium chloride 0.9 % 250 mL IVPB  Status:  Discontinued     1,250 mg 166.7 mL/hr over 90 Minutes Intravenous Every 8 hours 01/22/17 1202 01/23/17 1358   01/22/17 1230  vancomycin (VANCOCIN) 2,500 mg in sodium chloride 0.9 % 500 mL IVPB     2,500 mg 250 mL/hr over 120 Minutes Intravenous  Once 01/22/17 1147 01/22/17 1438   01/15/17 1815  piperacillin-tazobactam (ZOSYN) IVPB 3.375 g  Status:  Discontinued     3.375 g 12.5 mL/hr over 240 Minutes Intravenous Every 8 hours 01/15/17 1804 01/15/17 1804   01/15/17 1630  piperacillin-tazobactam (ZOSYN) IVPB 3.375 g  Status:  Discontinued     3.375 g 12.5 mL/hr over 240 Minutes Intravenous Every 8 hours 01/15/17 1616 01/23/17 1354      Medications:  Scheduled: . acetaminophen  1,000 mg Oral Q8H  . chlorhexidine  15 mL Mouth Rinse BID  . Chlorhexidine Gluconate Cloth  6 each Topical Daily  . enoxaparin (LOVENOX) injection  40 mg Subcutaneous Q12H  . hydrocortisone   Rectal BID  . insulin aspart  0-20 Units  Subcutaneous TID WC  . mouth rinse  15 mL Mouth Rinse q12n4p  . methocarbamol  500 mg Oral TID  . pneumococcal 23 valent vaccine  0.5 mL Intramuscular Tomorrow-1000  . polyethylene glycol  17 g Oral Daily  . protein supplement shake  2 oz Oral QID  . sodium chloride flush  5 mL Intravenous Q8H  . sodium chloride flush  5 mL Intravenous Q8H  . sodium chloride flush  5 mL Intravenous Q8H    Objective: Vital signs in last 24 hours: Temp:  [98 F (36.7 C)-98.3 F (36.8 C)] 98.3 F (36.8 C) (09/17 1422) Pulse Rate:  [97-107] 97 (09/17 1422) Resp:  [18] 18 (09/17 1422) BP: (121-128)/(70-82) 122/70 (09/17 1422) SpO2:  [92 %-95 %] 92 % (09/17 1422) Weight:  [147.3 kg (324 lb 12.8 oz)] 147.3 kg (324 lb 12.8 oz) (09/17 0537)   General appearance: alert, cooperative and no distress Resp: clear to auscultation bilaterally Cardio: regular rate and rhythm GI: normal findings: bowel sounds normal and soft, non-tender  Lab Results  Recent Labs  01/31/17 0449 02/01/17 0459 02/02/17 0429  WBC 4.6 3.9*  --   HGB 8.2* 8.4*  --   HCT 27.3* 27.3*  --   NA 134* 135 137  K  4.0 3.8 3.8  CL 100* 101 102  CO2 BUN 8 6 <5*  CREATININE 0.59* 0.54* 0.52*   Liver Panel No results for input(s): PROT, ALBUMIN, AST, ALT, ALKPHOS, BILITOT, BILIDIR, IBILI in the last 72 hours. Sedimentation Rate No results for input(s): ESRSEDRATE in the last 72 hours. C-Reactive Protein No results for input(s): CRP in the last 72 hours.  Microbiology: Recent Results (from the past 240 hour(s))  Aerobic/Anaerobic Culture (surgical/deep wound)     Status: None   Collection Time: 01/27/17  1:58 PM  Result Value Ref Range Status   Specimen Description ABSCESS PLEURAL LEFT  Final   Special Requests SYRINGE  Final   Gram Stain   Final    RARE WBC PRESENT,BOTH PMN AND MONONUCLEAR NO ORGANISMS SEEN    Culture No growth aerobically or anaerobically.  Final   Report Status 02/01/2017 FINAL  Final    Aerobic/Anaerobic Culture (surgical/deep wound)     Status: None   Collection Time: 01/27/17  2:34 PM  Result Value Ref Range Status   Specimen Description ABDOMEN  Final   Special Requests RIGHT PERIHEPATIC FLUID  Final   Gram Stain   Final    MODERATE WBC PRESENT,BOTH PMN AND MONONUCLEAR NO ORGANISMS SEEN    Culture No growth aerobically or anaerobically.  Final   Report Status 02/01/2017 FINAL  Final    Studies/Results: Ct Abdomen Pelvis W Contrast  Result Date: 01/31/2017 CLINICAL DATA:  Abscess.  History of complicated gastric bypass. EXAM: CT ABDOMEN AND PELVIS WITH CONTRAST TECHNIQUE: Multidetector CT imaging of the abdomen and pelvis was performed using the standard protocol following bolus administration of intravenous contrast. CONTRAST:  ISOVUE-300 IOPAMIDOL (ISOVUE-300) INJECTION 61% COMPARISON:  01/27/2017 FINDINGS: Lower chest: There are small bilateral pleural effusions with subtle pleural thickening. A left-sided chest tube has been removed since chest CT yesterday. The effusions are not progressed since prior. Multi segment lower lobe atelectasis. Small loculated pneumothorax on the left. Hepatobiliary: No primary abnormality noted. Peri hepatic collection describedbelow. Negative biliary tree. Pancreas: Unremarkable. Spleen: No acute finding.  No evidence of infarct. Adrenals/Urinary Tract: Negative adrenals. Patient has urolithiasis, but currently obscured by contrast excretion. No hydronephrosis. Small renal cysts. Unremarkable bladder. Stomach/Bowel: Status post gastric bypass. The collection interposed between the gastric remnant and pouch is resolved, with catheter in place. There is a right subphrenic collection extending along the right liver to the level of the paracolic gutter and pelvic inlet. This collection has catheters in the subphrenic and right lower quadrant spaces, both in continuity with the collection (septations could impede flow). The subphrenic  collection is significantly decreased from prior, now 12 mm in thickness. Residual predominant pocket measures 16 cm in length by 4 cm in thickness in the paracolic gutter. Lateral left perisplenic collection is mildly increased from 01/19/2017, now 13 x 3 cm as compared to 13 x 2 cm previously. No convincing change from CT 01/27/2017. No new collection is seen. Oral contrast is distributed throughout the colon. None is seen within the collections. Moderate sigmoid diverticulosis. Negative for bowel obstruction. Vascular/Lymphatic: No acute vascular abnormality. Prominent atherosclerotic calcification for age. Reproductive:No pathologic findings. Other: No ascites or pneumoperitoneum. Musculoskeletal: No acute abnormalities. IMPRESSION: 1. Resolved collection between the gastric remnant and gastric pouch, with percutaneous catheter in place. 2. Apparently continuous collection extending from the right subphrenic space to paracolic gutter and right lower quadrant. Subphrenic catheter in good position with significantly decreased collection compared to 01/19/2017. Residual subphrenic  collection measures up to 1.5 cm in thickness. The right paracolic gutter collection measures 16 cm in length by 4 cm in maximal thickness. 3. Left peri splenic/subphrenic collection is 13 cm craniocaudal and 3 cm in thickness. Collection is mildly increased from 01/19/2017. 4. Small bilateral complex pleural effusion with multi segment atelectasis. Left-sided chest tube has been removed since CT yesterday. 5. No extravasation of oral contrast, which reached the colon. Electronically Signed   By: Marnee Spring M.D.   On: 01/31/2017 17:20   Dg Chest Port 1 View  Result Date: 02/01/2017 CLINICAL DATA:  Shortness of breath. EXAM: PORTABLE CHEST 1 VIEW COMPARISON:  01/30/2017 FINDINGS: Right arm PICC tip is in the subclavian vein at the SVC subclavian junction. Drainage catheter remains evident in the right upper quadrant. Left-sided  drain is been removed. Bilateral effusions with atelectasis in both lower lungs. IMPRESSION: Bilateral effusions with atelectasis in both lower lungs. Electronically Signed   By: Paulina Fusi M.D.   On: 02/01/2017 09:43     Assessment/Plan: Abdominal abscesses  S/P Gastric surgery 12-08-16  DM2 x1 year  Morbid obesity  Total days of antibiotics: 18cefepime/flagyl --->invanz/flagyl   Unfortunately unable tostop flagyl for gardnerella. (started 9-7) His Cx from 9-11 are negative. Will change his invanz to levaquin to facilitate d/c.  Aim for 3 more weeks of anbx Will f/u with general surgery, consider f/u CT? Available as needed.           Johny Sax Infectious Diseases (pager) [phone] [email or web address] 02/02/2017, 3:59 PM  LOS: 18 days

## 2017-02-02 NOTE — Progress Notes (Signed)
Patient for possible d/c today.  Placed orders for schedulers to contact patient for output follow-up in IR drain clinic.   Loyce Dys, MS RD PA-C 4:43 PM

## 2017-02-02 NOTE — Therapy (Signed)
Occupational Therapy Evaluation Patient Details Name: Albert White MRN: [id number] DOB: [birth year] Today's Date: 02/02/2017    History of Present Illness Pt is a 41 year old male with history of type 2 diabetes, dyslipidemia, obstructive sleep apnea, Roux-en-Y/gastric bypass on 12/08/16 in [place] by Dr. Franki Cabot, rehospitalized 12/30/16-01/10/17 for anasarca, abdominal pain, fever of 103F and postoperative seroma for which a percutaneous drain was placed. After discharge, patient continued to have abdominal pain associated with nausea, emesis and decreased appetite. Outpatient CT of the abdomen revealed 10.7 x 5 cm abscess abutting the posterior margin of the GJ anastomosis worrisome for dehiscence, diffuse peritonitis with large gas containing abscess throughout the peritoneal cavity including a right perihepatic abscess compressing the right liver lobe and provoking anileus. Patient was admitted to the hospital with the working diagnosis of abdominal sepsis due to peritonitis, and intra-abdominal abscess related to a postoperative complication of Roux-en-Y/gastric bypass (12/08/16).   Clinical Impression   Pt reports being independent in ADLs and working full time in [employer]. Currently pt is able to complete ADLs with modified independence with the exception of min assist for bathing. Pt educated on energy conservation techniques. Pt reports family is available to provided assistance as needed upon d/c. No acute OT needs at this time. Please notify OT if there is a change in functional mobility status.     Follow Up Recommendations  No OT follow up;Supervision - Intermittent    Equipment Recommendations  None recommended by OT    Recommendations for Other Services       Precautions / Restrictions Precautions Precautions: None Precaution Comments: drains x3 in abdomen Restrictions Weight Bearing Restrictions: No      Mobility Bed Mobility                General bed mobility comments: Pt sitting in chair upon arrival/  Transfers Overall transfer level: Modified independent Equipment used: None                  Balance Overall balance assessment: No apparent balance deficits (not formally assessed)                                         ADL either performed or assessed with clinical judgement   ADL Overall ADL's : Needs assistance/impaired     Grooming: Modified independent;Standing   Upper Body Bathing: Minimal assistance;Standing   Lower Body Bathing: Minimal assistance;Sit to/from stand   Upper Body Dressing : Modified independent   Lower Body Dressing: Modified independent   Toilet Transfer: Modified Independent   Toileting- Clothing Manipulation and Hygiene: Modified independent   Tub/ Shower Transfer: Tub transfer;Modified independent   Functional mobility during ADLs: Modified independent General ADL Comments: Pt overall modified independent for ADLs. Pt reports min assist from his wife for bathing due to drains in abdomen.     Vision         Perception     Praxis      Pertinent Vitals/Pain Pain Assessment: Faces Pain Score: 2  Faces Pain Scale: Hurts a little bit Pain Location: Abdomen Pain Descriptors / Indicators: Operative site guarding;Discomfort Pain Intervention(s): Limited activity within patient's tolerance;Monitored during session     Hand Dominance Right   Extremity/Trunk Assessment Upper Extremity Assessment Upper Extremity Assessment: Overall WFL for tasks assessed   Lower Extremity Assessment Lower Extremity Assessment: Overall WFL for tasks assessed   Cervical /  Trunk Assessment Cervical / Trunk Assessment: Normal   Communication Communication Communication: No difficulties   Cognition Arousal/Alertness: Awake/alert Behavior During Therapy: WFL for tasks assessed/performed Overall Cognitive Status: Within Functional Limits for tasks assessed                                      General Comments  Pts wife present during eval. Pt reports a general feeling of weakness and deconditioning from being in the hospital. Pt educated on energy conservation techniques. Pt reports no concerns about going home.     Exercises     Shoulder Instructions      Home Living Family/patient expects to be discharged to:: Private residence Living Arrangements: Spouse/significant other Available Help at Discharge: Family Type of Home: House Home Access: Stairs to enter Secretary/administrator of Steps: 2 Entrance Stairs-Rails: Right Home Layout: One level     Bathroom Shower/Tub: Chief Strategy Officer: Standard Bathroom Accessibility: Yes How Accessible: Accessible via walker Home Equipment: None          Prior Functioning/Environment Level of Independence: Independent        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[employer]; walks 7-8 miles a day        OT Problem List:        OT Treatment/Interventions:      OT Goals(Current goals can be found in the care plan section) Acute Rehab OT Goals Patient Stated Goal: Home to family OT Goal Formulation: With patient  OT Frequency:     Barriers to D/C:            Co-evaluation              AM-PAC PT "6 Clicks" Daily Activity     Outcome Measure Help from another person eating meals?: None Help from another person taking care of personal grooming?: None Help from another person toileting, which includes using toliet, bedpan, or urinal?: None Help from another person bathing (including washing, rinsing, drying)?: A Little Help from another person to put on and taking off regular upper body clothing?: None Help from another person to put on and taking off regular lower body clothing?: None 6 Click Score: 23   End of Session Equipment Utilized During Treatment: Gait belt Nurse Communication: Mobility status  Activity Tolerance: Patient tolerated treatment  well Patient left: in chair;with call bell/phone within reach;with family/visitor present  OT Visit Diagnosis: Unsteadiness on feet (R26.81);Pain Pain - part of body:  (Abdomen)                Time: 4098-1191 OT Time Calculation (min): 8 min Charges:  OT General Charges $OT Visit: 1 Visit OT Evaluation $OT Eval Low Complexity: 1 Low G-Codes:     Cammy Copa, OTS [phone]   Cammy Copa 02/02/2017, 4:05 PM

## 2017-02-02 NOTE — Discharge Instructions (Signed)
Keep a written record of the drains and bring that to the office with you when you come to [facility] surgery Keep a record of each drain separately  Be sure to stay well hydrated Take a walk around the block every day Use the incentive spirometer several times a day

## 2017-02-02 NOTE — Progress Notes (Signed)
Pt refuse CPAP for the night.  

## 2017-02-03 DIAGNOSIS — K9189 Other postprocedural complications and disorders of digestive system: Secondary | ICD-10-CM

## 2017-02-03 LAB — MAGNESIUM: Magnesium: 1.6 mg/dL — ABNORMAL LOW (ref 1.7–2.4)

## 2017-02-03 LAB — GLUCOSE, CAPILLARY: GLUCOSE-CAPILLARY: 103 mg/dL — AB (ref 65–99)

## 2017-02-03 MED ORDER — SODIUM CHLORIDE 0.9% FLUSH: 10.0000 mL | INTRAVENOUS | 0 refills | Status: DC | PRN

## 2017-02-03 MED ORDER — LEVOFLOXACIN 750 MG PO TABS: 750.0000 mg | ORAL_TABLET | ORAL | 0 refills | Status: AC

## 2017-02-03 MED ORDER — METRONIDAZOLE 500 MG PO TABS: 500.0000 mg | ORAL_TABLET | Freq: Three times a day (TID) | ORAL | 0 refills | Status: AC

## 2017-02-03 NOTE — Progress Notes (Signed)
Pt and wife verbally understoood DC instructions. Wife understands drain care and flushed each drain before they left. Supplies sent home with pt.. No questions asked.

## 2017-02-03 NOTE — Progress Notes (Signed)
[facility]  [facility] will provide [facility] for drain care/DM teaching, assessments, etc. Upon DC today.  [facility] will make [facility] visit on 02-04-17. Wife Miranda made aware.  If patient discharges after hours, please call [phone].   Sedalia Muta 02/03/2017, 11:19 AM

## 2017-02-03 NOTE — Progress Notes (Signed)
Referring Physician(s): Dr Fredonia Highland  Supervising Physician: Richarda Overlie  Patient Status:  [facility] - In-pt  Chief Complaint:  Intra abdominal abscesses  Subjective:  3 drains now placed in IR 8/31; 9/5 and 9/11 All drains doing well Pt feeling great Plan for dc soon per chart  Allergies: Patient has no known allergies.  Medications: Prior to Admission medications   Medication Sig Start Date End Date Taking? Authorizing Provider  acetaminophen (TYLENOL) 325 MG tablet Take 2 tablets (650 mg total) by mouth every 8 (eight) hours. Patient taking differently: Take 650 mg by mouth every 8 (eight) hours as needed for moderate pain or headache.  02/27/16  Yes Mesner, Barbara Cower, MD  atorvastatin (LIPITOR) 10 MG tablet Take 10 mg by mouth every evening.  09/18/15  Yes [provider]  calcium carbonate (TUMS - DOSED IN MG ELEMENTAL CALCIUM) 500 MG chewable tablet Chew 500 mg by mouth as needed for heartburn.   Yes [provider]  nystatin (MYCOSTATIN) 100000 UNIT/ML suspension Take by mouth as needed. Thrush. 1 tsp 11/27/16  Yes [provider]  omeprazole (PRILOSEC) 20 MG capsule Take 20 mg by mouth 2 (two) times daily. 11/27/16  Yes [provider]  OVER THE COUNTER MEDICATION Apply 1 application topically as needed (eczema). [employer] Eczema Cream    Yes [provider]  oxyCODONE (ROXICODONE) 5 MG immediate release tablet Take 1 tablet (5 mg total) by mouth every 6 (six) hours as needed for severe pain. 03/05/16  Yes Ghimire, Werner Lean, MD  traZODone (DESYREL) 100 MG tablet Take 100 mg by mouth at bedtime.   Yes [provider]  ursodiol (ACTIGALL) 300 MG capsule Take 300 mg by mouth 2 (two) times daily. 11/27/16  Yes [provider]  cephALEXin (KEFLEX) 500 MG capsule Take 1 capsule (500 mg total) by mouth 3 (three) times daily. Patient not taking: Reported on 01/15/2017 03/05/16   Maretta Bees, MD  doxycycline (VIBRA-TABS)  100 MG tablet Take 1 tablet (100 mg total) by mouth 2 (two) times daily. Patient not taking: Reported on 01/15/2017 03/05/16   Maretta Bees, MD  metFORMIN (GLUCOPHAGE) 500 MG tablet Take 2 tablets (1,000 mg total) by mouth 2 (two) times daily with a meal. Patient not taking: Reported on 01/15/2017 03/05/16   Maretta Bees, MD  methocarbamol (ROBAXIN) 500 MG tablet Take 1 tablet (500 mg total) by mouth every 8 (eight) hours as needed for muscle spasms. 02/02/17   Maxie Barb, MD  oxyCODONE 10 MG TABS Take 1 tablet (10 mg total) by mouth every 8 (eight) hours as needed for severe pain. 02/02/17   Maxie Barb, MD     Vital Signs: BP 115/62 (BP Location: Left Arm)   Pulse (!) 105   Temp 98.3 F (36.8 C) (Oral)   Resp 18   Ht  (1.778 m)   Wt (!) 323 lb 8 oz (146.7 kg)   SpO2 95%   BMI 46.42 kg/m   Physical Exam  Abdominal: Soft. Bowel sounds are normal. There is tenderness.  Neurological: He is alert.  Skin: Skin is warm and dry.  All sites are clean and dry NT no bleeding All drains intact OP 10-30 daily for each    Nursing note and vitals reviewed.   Imaging: Ct Chest Wo Contrast  Result Date: 01/30/2017 CLINICAL DATA:  Evaluate pleural effusion EXAM: CT CHEST WITHOUT CONTRAST TECHNIQUE: Multidetector CT imaging of the chest was performed following the  standard protocol without IV contrast. COMPARISON:  01/24/2017 CT and 01/30/2017 CXR FINDINGS: Cardiovascular: PICC line tip terminates in the brachiocephalic confluence. Heart size is normal. No significant pericardial effusion. No aneurysm of the aorta. Mediastinum/Nodes: Midline trachea. No thyroid mass. No mediastinal adenopathy. Hilum is difficult to evaluate without IV contrast. Esophagus is within normal limits. Lungs/Pleura: Bilateral chest tubes are identified, one coiled along the base of the right lung and the second along the periphery of the left hemithorax. Small ex vacuo pneumothorax  status post drainage of left pleural fluid is seen along the posterior aspect of the left lung along the major fissure with small amount of air noted overlying the left lung apex. Slight interval increase in right pleural effusion since prior despite drainage catheter measuring 3.5 cm in thickness versus approximately 2.5 cm in thickness previously. Upper Abdomen: A percutaneous pigtail catheter is again seen in the left upper quadrant of the abdomen adjacent to the bypassed stomach with further decrease in abscess collection. Second right upper quadrant pigtail drainage catheter overlies the right hepatic lobe with smaller volume of crescentic fluid outlining the liver currently. Musculoskeletal: No acute nor suspicious osseous abnormality. IMPRESSION: 1. Bilateral chest tubes and upper abdominal drainage catheters in place as above. 2. There has been some interval increase in right-sided pleural effusion despite presence of a right basilar chest tube. Small pneumothorax ex vacuo status post drainage left pleural effusion with smaller volume of pleural fluid seen. 3. Decrease in right perihepatic fluid collection since prior as well as adjacent to the bypassed stomach. Electronically Signed   By: Tollie Eth M.D.   On: 01/30/2017 20:54   Ct Abdomen Pelvis W Contrast  Result Date: 01/31/2017 CLINICAL DATA:  Abscess.  History of complicated gastric bypass. EXAM: CT ABDOMEN AND PELVIS WITH CONTRAST TECHNIQUE: Multidetector CT imaging of the abdomen and pelvis was performed using the standard protocol following bolus administration of intravenous contrast. CONTRAST:  ISOVUE-300 IOPAMIDOL (ISOVUE-300) INJECTION 61% COMPARISON:  01/27/2017 FINDINGS: Lower chest: There are small bilateral pleural effusions with subtle pleural thickening. A left-sided chest tube has been removed since chest CT yesterday. The effusions are not progressed since prior. Multi segment lower lobe atelectasis. Small loculated  pneumothorax on the left. Hepatobiliary: No primary abnormality noted. Peri hepatic collection describedbelow. Negative biliary tree. Pancreas: Unremarkable. Spleen: No acute finding.  No evidence of infarct. Adrenals/Urinary Tract: Negative adrenals. Patient has urolithiasis, but currently obscured by contrast excretion. No hydronephrosis. Small renal cysts. Unremarkable bladder. Stomach/Bowel: Status post gastric bypass. The collection interposed between the gastric remnant and pouch is resolved, with catheter in place. There is a right subphrenic collection extending along the right liver to the level of the paracolic gutter and pelvic inlet. This collection has catheters in the subphrenic and right lower quadrant spaces, both in continuity with the collection (septations could impede flow). The subphrenic collection is significantly decreased from prior, now 12 mm in thickness. Residual predominant pocket measures 16 cm in length by 4 cm in thickness in the paracolic gutter. Lateral left perisplenic collection is mildly increased from 01/19/2017, now 13 x 3 cm as compared to 13 x 2 cm previously. No convincing change from CT 01/27/2017. No new collection is seen. Oral contrast is distributed throughout the colon. None is seen within the collections. Moderate sigmoid diverticulosis. Negative for bowel obstruction. Vascular/Lymphatic: No acute vascular abnormality. Prominent atherosclerotic calcification for age. Reproductive:No pathologic findings. Other: No ascites or pneumoperitoneum. Musculoskeletal: No acute abnormalities. IMPRESSION: 1. Resolved collection between  the gastric remnant and gastric pouch, with percutaneous catheter in place. 2. Apparently continuous collection extending from the right subphrenic space to paracolic gutter and right lower quadrant. Subphrenic catheter in good position with significantly decreased collection compared to 01/19/2017. Residual subphrenic collection measures up to 1.5  cm in thickness. The right paracolic gutter collection measures 16 cm in length by 4 cm in maximal thickness. 3. Left peri splenic/subphrenic collection is 13 cm craniocaudal and 3 cm in thickness. Collection is mildly increased from 01/19/2017. 4. Small bilateral complex pleural effusion with multi segment atelectasis. Left-sided chest tube has been removed since CT yesterday. 5. No extravasation of oral contrast, which reached the colon. Electronically Signed   By: Marnee Spring M.D.   On: 01/31/2017 17:20   Dg Chest Port 1 View  Result Date: 02/01/2017 CLINICAL DATA:  Shortness of breath. EXAM: PORTABLE CHEST 1 VIEW COMPARISON:  01/30/2017 FINDINGS: Right arm PICC tip is in the subclavian vein at the SVC subclavian junction. Drainage catheter remains evident in the right upper quadrant. Left-sided drain is been removed. Bilateral effusions with atelectasis in both lower lungs. IMPRESSION: Bilateral effusions with atelectasis in both lower lungs. Electronically Signed   By: Paulina Fusi M.D.   On: 02/01/2017 09:43    Labs:  CBC:  Recent Labs  01/26/17 0433 01/27/17 0551 01/31/17 0449 02/01/17 0459  WBC 5.5 5.4 4.6 3.9*  HGB 8.2* 8.2* 8.2* 8.4*  HCT 27.9* 27.3* 27.3* 27.3*  PLT 432* 444* 392 390    COAGS:  Recent Labs  01/15/17 1420 01/20/17 1744 01/23/17 0723  INR 1.07 1.19 1.12    BMP:  Recent Labs  01/29/17 0405 01/31/17 0449 02/01/17 0459 02/02/17 0429  NA 133* 134* 135 137  K 4.0 4.0 3.8 3.8  CL 98* 100* 101 102  CO2 GLUCOSE 131* 104* 98 104*  BUN <5*  CALCIUM 8.2* 8.1* 8.1* 8.0*  CREATININE 0.46* 0.59* 0.54* 0.52*  GFRNONAA >60 >60 >60 >60  GFRAA >60 >60 >60 >60    LIVER FUNCTION TESTS:  Recent Labs  01/22/17 0553 01/23/17 1248 01/26/17 0433 01/27/17 1937 01/29/17 0405  BILITOT 0.6 0.8 0.4  --  0.2*  AST --  17  ALT 18 19 15*  --  10*  ALKPHOS 139* 175* 116  --  83  PROT 5.7* 6.7 6.4*  --  6.0*  ALBUMIN 1.6* 1.9*  1.9* 1.7* 1.8*    Assessment and Plan:  Intra abdominal abscesses Post gastric bypass surgery For dc home with drains per chart Will need flush regimen - 5-10 cc ea drain 1x/day Record output of all separately follow in IR drain clinic 5-10 days Pt will hear from drain clinic for time and date   Electronically Signed: TURPIN,PAMELA A, PA-C 02/03/2017, 9:41 AM   I spent a total of 15 Minutes at the the patient's bedside AND on the patient's hospital floor or unit, greater than 50% of which was counseling/coordinating care for intra abd abscesses

## 2017-02-03 NOTE — Care Management Note (Addendum)
Case Management Note  Patient Details  Name: NORTH ESTERLINE MRN: [id number] Date of Birth: [birth year]  Subjective/Objective:                    Action/Plan:  Asked MD for prescription for NS flushes . [facility] Jacki Cones  aware patient discharging today  Expected Discharge Date:                  Expected Discharge Plan:  Home w Home Health Services  In-House Referral:     Discharge planning Services  CM Consult  Post Acute Care Choice:  Home Health Choice offered to:  Patient, Spouse  DME Arranged:    DME Agency:     HH Arranged:  RN HH Agency:  [facility]  Status of Service:  Completed, signed off  If discussed at Long Length of Stay Meetings, dates discussed:    Additional Comments:  Kingsley Plan, RN 02/03/2017, 9:51 AM

## 2017-02-03 NOTE — Discharge Summary (Signed)
Physician Discharge Summary  Albert White XLK:[id number] DOB: 15-Jan-1976 DOA: 01/15/2017  PCP: Alysia Penna, MD  Admit date: 01/15/2017 Discharge date: 02/03/2017  Admitted From:home Disposition:home with home care services  Recommendations for Outpatient Follow-up:  1. Follow up with PCP in 1-2 weeks 2. Please obtain BMP/CBC in one week  Home Health:yes Equipment/Devices:abdominal drains Discharge Condition:stable CODE STATUS:full code Diet recommendation:heart healthy  Brief/Interim Summary: 41 year old male with history of type 2 diabetes, dyslipidemia, obstructive sleep apnea, Roux-en-Y/gastric bypass on 12/08/16 in [place] by Dr. Franki Cabot, rehospitalized 12/30/16-01/10/17 for anasarca, abdominal pain, fever of 103F and postoperative seroma for which a percutaneous drain was placed. After discharge from hospital patient continued to have abdominal pain, associated with nausea, emesis and decreased appetite, outpatient CT of the abdomen showedgas containing 10.7 x 5 cm abscess abutting the posterior margin of the GJ anastomosis worrisome for dehiscence, diffuse peritonitis with large gas containing abscess throughout the peritoneal cavity including a right perihepatic abscess compressing the right liver lobe and provoking anileus, findings that prompted rehospitalization.  Patient was admitted to the hospital with the working diagnosis of abdominal sepsis due to peritonitis, and intra-abdominal abscess related to a postoperative complication of Roux-en-Y/gastric bypass (12/08/16).  # Sepsis due to peritonitis and multiple intra-abdominal abscess, colitis - s/p RLQ drain placement on 01/16/17, peri-epigastric drain placement 9/5  and hepatic abscess drain and chest tube placement by 9/11. -the chest tube was removed. Evaluated by pulmonologist, cardiothoracic surgeon, IR  and general surgery.patient clinically improved. Plan to continue oral Flagyl and Levaquin for 3 more weeks  as per infectious disease. The culture from September 11 negative. Patient has been tolerating diet well. Pain is controlled. Denied nausea vomiting. Okay to discharge from multiple consultations.Home care service ordered. Follow-up with general surgery for drain. Pain medications ordered and patient was educated on the side effect of oxycodone including sedation. Recommended not to drive.  # Pain management:pain is controlled with oral medication only. Discharging with oral pills with plan to wean off. Recommended to follow up with surgeon and PCP.   #Pleural effusion: CT scan with a moderate left pleural effusion with loculation status post chest tube placement. CT surgery following. -Status post lytic treatment by pulmonologist. Removal of chest tube on 9/15.  --repeat CXR with congestion, echo unremarkable except mild concentric hypertrophy.  #Type 2 diabetes: A1c 5.7. Recommended to follow up with PCP.  #Obstructive sleep apnea: Continue CPAP. Continue incentive spirometer  #Anemia multifactorial etiology, likely due to chronic disease: Monitor CBC. No sign of bleeding. Hemoglobin is stable  #History of morbid obesity  #Hypomagnesemia: repeat lab as outpatient.  Patient is clinically improved..plan to discharge with the drains and antibiotics. Discussed with the patient, his wife, case Production designer, theatre/television/film and nursing staff. At this time patient is medically stable to transfer his care to outpatient.   Discharge Diagnoses:  Active Problems:   Postprocedural intraabdominal abscess   Pleural effusion   Peritonitis (HCC)   Gastrointestinal anastomotic leak   Acute pulmonary edema (HCC)   Hypomagnesemia   Pain management    Discharge Instructions  Discharge Instructions    Call MD for:  difficulty breathing, headache or visual disturbances    Complete by:  As directed    Call MD for:  extreme fatigue    Complete by:  As directed    Call MD for:  hives    Complete by:  As directed     Call MD for:  persistant dizziness or light-headedness    Complete by:  As directed    Call MD for:  persistant nausea and vomiting    Complete by:  As directed    Call MD for:  redness, tenderness, or signs of infection (pain, swelling, redness, odor or green/yellow discharge around incision site)    Complete by:  As directed    Call MD for:  severe uncontrolled pain    Complete by:  As directed    Call MD for:  temperature >100.4    Complete by:  As directed    Diet - low sodium heart healthy    Complete by:  As directed    Discharge instructions    Complete by:  As directed    Please follow up with surgery, PCP within a week,.   Increase activity slowly    Complete by:  As directed    PICC line removal    Complete by:  As directed      Allergies as of 02/03/2017   No Known Allergies     Medication List    STOP taking these medications   cephALEXin 500 MG capsule Commonly known as:  KEFLEX   doxycycline 100 MG tablet Commonly known as:  VIBRA-TABS   metFORMIN 500 MG tablet Commonly known as:  GLUCOPHAGE     TAKE these medications   acetaminophen 325 MG tablet Commonly known as:  TYLENOL Take 2 tablets (650 mg total) by mouth every 8 (eight) hours. What changed:  when to take this  reasons to take this   atorvastatin 10 MG tablet Commonly known as:  LIPITOR Take 10 mg by mouth every evening.   calcium carbonate 500 MG chewable tablet Commonly known as:  TUMS - dosed in mg elemental calcium Chew 500 mg by mouth as needed for heartburn.   levofloxacin 750 MG tablet Commonly known as:  LEVAQUIN Take 1 tablet (750 mg total) by mouth daily.   methocarbamol 500 MG tablet Commonly known as:  ROBAXIN Take 1 tablet (500 mg total) by mouth every 8 (eight) hours as needed for muscle spasms.   metroNIDAZOLE 500 MG tablet Commonly known as:  FLAGYL Take 1 tablet (500 mg total) by mouth 3 (three) times daily.   nystatin 100000 UNIT/ML suspension Commonly  known as:  MYCOSTATIN Take by mouth as needed. Thrush. 1 tsp   omeprazole 20 MG capsule Commonly known as:  PRILOSEC Take 20 mg by mouth 2 (two) times daily.   OVER THE COUNTER MEDICATION Apply 1 application topically as needed (eczema). [employer] Eczema Cream   Oxycodone HCl 10 MG Tabs Take 1 tablet (10 mg total) by mouth every 8 (eight) hours as needed for severe pain. What changed:  medication strength  how much to take  when to take this   sodium chloride flush 0.9 % Soln Commonly known as:  NS Inject 10 mLs into the vein as needed.   traZODone 100 MG tablet Commonly known as:  DESYREL Take 100 mg by mouth at bedtime.   ursodiol 300 MG capsule Commonly known as:  ACTIGALL Take 300 mg by mouth 2 (two) times daily.            Discharge Care Instructions        Start     Ordered   02/03/17 0000  levofloxacin (LEVAQUIN) 750 MG tablet  Every 24 hours     02/03/17 1002   02/03/17 0000  metroNIDAZOLE (FLAGYL) 500 MG tablet  3 times daily     02/03/17 1002   02/03/17 0000  sodium chloride flush (NS) 0.9 % SOLN  As needed     02/03/17 1002   02/03/17 0000  PICC line removal     02/03/17 1002   02/03/17 0000  Increase activity slowly     02/03/17 1002   02/03/17 0000  Diet - low sodium heart healthy     02/03/17 1002   02/03/17 0000  Discharge instructions    Comments:  Please follow up with surgery, PCP within a week,.   02/03/17 1002   02/03/17 0000  Call MD for:  temperature >100.4     02/03/17 1002   02/03/17 0000  Call MD for:  persistant nausea and vomiting     02/03/17 1002   02/03/17 0000  Call MD for:  severe uncontrolled pain     02/03/17 1002   02/03/17 0000  Call MD for:  difficulty breathing, headache or visual disturbances     02/03/17 1002   02/03/17 0000  Call MD for:  hives     02/03/17 1002   02/03/17 0000  Call MD for:  persistant dizziness or light-headedness     02/03/17 1002   02/03/17 0000  Call MD for:  extreme fatigue      02/03/17 1002   02/03/17 0000  Call MD for:  redness, tenderness, or signs of infection (pain, swelling, redness, odor or green/yellow discharge around incision site)     02/03/17 1002   02/02/17 0000  oxyCODONE 10 MG TABS  Every 8 hours PRN     02/02/17 1504   02/02/17 0000  methocarbamol (ROBAXIN) 500 MG tablet  Every 8 hours PRN     02/02/17 1504   02/02/17 0000  IR Radiologist Eval & Mgmt    Comments:  Needs follow-up in 1-2 weeks  Question Answer Comment  Reason for Exam (SYMPTOM  OR DIAGNOSIS REQUIRED) s/p multiple drains, most recent drain placed by [facility]   Preferred Imaging Location? [facility]      02/02/17 1635     Follow-up Information    Kinsinger, De Blanch, MD. Call.   Specialty:  General Surgery Why:  Call and make an appointment to follow up  see Dr. Alvan Dame  in 2 weeks or less Contact information: [address] [place] [address] [phone]        Alysia Penna, MD. Schedule an appointment as soon as possible for a visit in 1 week(s).   Specialty:  Internal Medicine Contact information: [address] [place] [address] [phone]        Irish Lack, MD Follow up.   Specialty:  Interventional Radiology Why:  as needed for abdomen tubes Contact information: [address] [place] [address] [phone]          No Known Allergies  Consultations:  General surgery  IR  Infectious disease  Pulmonologist  Cardiothoracic surgery  Pharmacist  Procedures/Studies: Multiple drains  Subjective: Seen and examined at bedside. Denied headache, dizziness, nausea, vomiting, chest pain, shortness of breath. Having good bowel movement. Tolerating diet well. Patient's wife at bedside. Eager to go home today.  Discharge Exam: Vitals:   02/02/17 2150 02/03/17 0615  BP: 125/80 115/62  Pulse: (!) 102 (!) 105  Resp: 16 18  Temp: 98.4 F (36.9 C) 98.3 F (36.8 C)  SpO2: 95% 95%    Vitals:   02/02/17 0537 02/02/17 1422 02/02/17 2150 02/03/17 0615  BP: 121/74 122/70 125/80 115/62  Pulse: (!) 107 97 (!)  102 (!) 105  Resp: Temp: 98 F (36.7 C) 98.3 F (36.8 C) 98.4 F (36.9 C) 98.3 F (36.8 C)  TempSrc: Oral Oral Oral Oral  SpO2:  92% 95% 95%  Weight: (!) 147.3 kg (324 lb 12.8 oz)   (!) 146.7 kg (323 lb 8 oz)  Height:        General: Pt is alert, awake, not in acute distress Cardiovascular: RRR, S1/S2 +, no rubs, no gallops Respiratory: CTA bilaterally, no wheezing, no rhonchi Abdominal: Soft, NT, ND, bowel sounds +, 3 abdominal drains with drains site looks clean Extremities: no edema, no cyanosis    The results of significant diagnostics from this hospitalization (including imaging, microbiology, ancillary and laboratory) are listed below for reference.     Microbiology: Recent Results (from the past 240 hour(s))  Aerobic/Anaerobic Culture (surgical/deep wound)     Status: None   Collection Time: 01/27/17  1:58 PM  Result Value Ref Range Status   Specimen Description ABSCESS PLEURAL LEFT  Final   Special Requests SYRINGE  Final   Gram Stain   Final    RARE WBC PRESENT,BOTH PMN AND MONONUCLEAR NO ORGANISMS SEEN    Culture No growth aerobically or anaerobically.  Final   Report Status 02/01/2017 FINAL  Final  Aerobic/Anaerobic Culture (surgical/deep wound)     Status: None   Collection Time: 01/27/17  2:34 PM  Result Value Ref Range Status   Specimen Description ABDOMEN  Final   Special Requests RIGHT PERIHEPATIC FLUID  Final   Gram Stain   Final    MODERATE WBC PRESENT,BOTH PMN AND MONONUCLEAR NO ORGANISMS SEEN    Culture No growth aerobically or anaerobically.  Final   Report Status 02/01/2017 FINAL  Final     Labs: BNP (last 3 results)  Recent Labs  01/15/17 1543 01/22/17 0553  BNP 16.5 28.9   Basic Metabolic Panel:  Recent Labs Lab 01/29/17 0405 01/31/17 0449 02/01/17 0459 02/02/17 0429 02/03/17 0514  NA  133* 134* 135 137  --   K 4.0 4.0 3.8 3.8  --   CL 98* 100* 101 102  --   CO2 --   GLUCOSE 131* 104* 98 104*  --   BUN <5*  --   CREATININE 0.46* 0.59* 0.54* 0.52*  --   CALCIUM 8.2* 8.1* 8.1* 8.0*  --   MG 1.5* 1.5* 1.5* 1.7 1.6*  PHOS 4.5  --   --   --   --    Liver Function Tests:  Recent Labs Lab 01/27/17 1937 01/29/17 0405  AST  --  17  ALT  --  10*  ALKPHOS  --  83  BILITOT  --  0.2*  PROT  --  6.0*  ALBUMIN 1.7* 1.8*   No results for input(s): LIPASE, AMYLASE in the last 168 hours. No results for input(s): AMMONIA in the last 168 hours. CBC:  Recent Labs Lab 01/31/17 0449 02/01/17 0459  WBC 4.6 3.9*  HGB 8.2* 8.4*  HCT 27.3* 27.3*  MCV 84.3 84.5  PLT 392 390   Cardiac Enzymes: No results for input(s): CKTOTAL, CKMB, CKMBINDEX, TROPONINI in the last 168 hours. BNP: Invalid input(s): POCBNP CBG:  Recent Labs Lab 02/02/17 0745 02/02/17 1220 02/02/17 1653 02/02/17 2144 02/03/17 0733  GLUCAP 102* 96 87 99 103*   D-Dimer No results for input(s): DDIMER in the last 72 hours. Hgb A1c No results for input(s): HGBA1C in  the last 72 hours. Lipid Profile No results for input(s): CHOL, HDL, LDLCALC, TRIG, CHOLHDL, LDLDIRECT in the last 72 hours. Thyroid function studies No results for input(s): TSH, T4TOTAL, T3FREE, THYROIDAB in the last 72 hours.  Invalid input(s): FREET3 Anemia work up No results for input(s): VITAMINB12, FOLATE, FERRITIN, TIBC, IRON, RETICCTPCT in the last 72 hours. Urinalysis    Component Value Date/Time   COLORURINE YELLOW 01/15/2017 1624   APPEARANCEUR CLEAR 01/15/2017 1624   LABSPEC >1.046 (H) 01/15/2017 1624   PHURINE 5.0 01/15/2017 1624   GLUCOSEU NEGATIVE 01/15/2017 1624   HGBUR NEGATIVE 01/15/2017 1624   BILIRUBINUR NEGATIVE 01/15/2017 1624   BILIRUBINUR neg 03/04/2012 2006   KETONESUR 20 (A) 01/15/2017 1624   PROTEINUR NEGATIVE 01/15/2017 1624   UROBILINOGEN 0.2 03/04/2012 2006   UROBILINOGEN 1.0  02/28/2012 1424   NITRITE NEGATIVE 01/15/2017 1624   LEUKOCYTESUR NEGATIVE 01/15/2017 1624   Sepsis Labs Invalid input(s): PROCALCITONIN,  WBC,  LACTICIDVEN Microbiology Recent Results (from the past 240 hour(s))  Aerobic/Anaerobic Culture (surgical/deep wound)     Status: None   Collection Time: 01/27/17  1:58 PM  Result Value Ref Range Status   Specimen Description ABSCESS PLEURAL LEFT  Final   Special Requests SYRINGE  Final   Gram Stain   Final    RARE WBC PRESENT,BOTH PMN AND MONONUCLEAR NO ORGANISMS SEEN    Culture No growth aerobically or anaerobically.  Final   Report Status 02/01/2017 FINAL  Final  Aerobic/Anaerobic Culture (surgical/deep wound)     Status: None   Collection Time: 01/27/17  2:34 PM  Result Value Ref Range Status   Specimen Description ABDOMEN  Final   Special Requests RIGHT PERIHEPATIC FLUID  Final   Gram Stain   Final    MODERATE WBC PRESENT,BOTH PMN AND MONONUCLEAR NO ORGANISMS SEEN    Culture No growth aerobically or anaerobically.  Final   Report Status 02/01/2017 FINAL  Final     Time coordinating discharge: 35 minutes  SIGNED:   Maxie Barb, MD  [facility] 02/03/2017, 10:02 AM  If 7PM-7AM, please contact night-coverage [email or web address] Password [email or web address]

## 2017-02-04 ENCOUNTER — Other Ambulatory Visit: Payer: Self-pay | Admitting: General Surgery

## 2017-02-04 ENCOUNTER — Other Ambulatory Visit (HOSPITAL_COMMUNITY): Payer: Self-pay | Admitting: Student

## 2017-02-04 ENCOUNTER — Other Ambulatory Visit: Payer: 59

## 2017-02-04 DIAGNOSIS — J90 Pleural effusion, not elsewhere classified: Secondary | ICD-10-CM | POA: Diagnosis not present

## 2017-02-04 DIAGNOSIS — T8149XA Infection following a procedure, other surgical site, initial encounter: Principal | ICD-10-CM

## 2017-02-04 DIAGNOSIS — T8143XA Infection following a procedure, organ and space surgical site, initial encounter: Secondary | ICD-10-CM

## 2017-02-04 DIAGNOSIS — K6811 Postprocedural retroperitoneal abscess: Secondary | ICD-10-CM | POA: Diagnosis not present

## 2017-02-04 DIAGNOSIS — E119 Type 2 diabetes mellitus without complications: Secondary | ICD-10-CM | POA: Diagnosis not present

## 2017-02-11 ENCOUNTER — Ambulatory Visit
Admission: RE | Admit: 2017-02-11 | Discharge: 2017-02-11 | Disposition: A | Discharge status: Home / Self Care | Payer: 59 | Source: Ambulatory Visit | Attending: General Surgery | Admitting: General Surgery

## 2017-02-11 ENCOUNTER — Ambulatory Visit
Admission: RE | Admit: 2017-02-11 | Discharge: 2017-02-11 | Disposition: A | Discharge status: Home / Self Care | Payer: 59 | Source: Ambulatory Visit | Attending: Student | Admitting: Student

## 2017-02-11 ENCOUNTER — Other Ambulatory Visit (HOSPITAL_COMMUNITY): Payer: Self-pay | Admitting: Interventional Radiology

## 2017-02-11 DIAGNOSIS — T8143XA Infection following a procedure, organ and space surgical site, initial encounter: Secondary | ICD-10-CM

## 2017-02-11 DIAGNOSIS — Z4659 Encounter for fitting and adjustment of other gastrointestinal appliance and device: Secondary | ICD-10-CM | POA: Diagnosis not present

## 2017-02-11 DIAGNOSIS — K651 Peritoneal abscess: Secondary | ICD-10-CM | POA: Diagnosis not present

## 2017-02-11 DIAGNOSIS — T8149XA Infection following a procedure, other surgical site, initial encounter: Principal | ICD-10-CM

## 2017-02-11 DIAGNOSIS — D6489 Other specified anemias: Secondary | ICD-10-CM | POA: Diagnosis not present

## 2017-02-11 DIAGNOSIS — L02211 Cutaneous abscess of abdominal wall: Secondary | ICD-10-CM | POA: Diagnosis not present

## 2017-02-11 DIAGNOSIS — K9189 Other postprocedural complications and disorders of digestive system: Secondary | ICD-10-CM | POA: Diagnosis not present

## 2017-02-11 DIAGNOSIS — K912 Postsurgical malabsorption, not elsewhere classified: Secondary | ICD-10-CM | POA: Diagnosis not present

## 2017-02-11 DIAGNOSIS — R69 Illness, unspecified: Secondary | ICD-10-CM | POA: Diagnosis not present

## 2017-02-11 DIAGNOSIS — T814XXA Infection following a procedure, initial encounter: Secondary | ICD-10-CM | POA: Diagnosis not present

## 2017-02-11 HISTORY — PX: IR RADIOLOGIST EVAL & MGMT: IMG5224

## 2017-02-11 NOTE — Progress Notes (Signed)
Chief Complaint: Status post placement of 3 separate percutaneous drainage catheters to treat abdominal abscesses post gastric bypass surgery.  History of Present Illness: Albert White is a 41 y.o. male status post Roux-en-Y gastric bypass on 12/08/2016 in [place] by Dr. Clent Ridges. He presented here on 01/15/2017 with multiple intra-abdominal abscesses. Drainage of a huge pelvic abscess was performed on 01/16/2017 followed by percutaneous drainage of a perigastric abscess on 01/21/2017 and a perihepatic abscess on 01/27/2017.  He did not require surgery during recent admission and was discharged from the hospital on 02/03/2017.  Currently, there is approximately 15 mL of output via the pelvic drainage catheter daily. The epigastric drainage catheter has no output. The perihepatic drainage catheter has approximately 20 mL of output per day. The drains are being flushed with 5 mL of saline per day. He has not had a fever. He remains on Levaquin and Flagyl. He has been tolerating a soft diet and has been having bowel movements.  Past Medical History:  Diagnosis Date  . Abscess   . Diabetes mellitus without complication (HCC)   . Hidradenitis suppurativa   . Hyperlipemia   . Morbid obesity (HCC)   . Vertigo     Past Surgical History:  Procedure Laterality Date  . INCISION AND DRAINAGE ABSCESS Right 12/15/2013   Procedure: INCISION AND DRAINAGE Right Posterior neck ABSCESS;  Surgeon: Cherylynn Ridges, MD;  Location: [facility] OR;  Service: General;  Laterality: Right;  . INCISION AND DRAINAGE DEEP NECK ABSCESS     Hidradenitis - 3-4 times at [facility] and [place]  . IR THORACENTESIS ASP PLEURAL SPACE W/IMG GUIDE  01/23/2017    Allergies: Patient has no known allergies.  Medications: Prior to Admission medications   Medication Sig Start Date End Date Taking? Authorizing Provider  acetaminophen (TYLENOL) 325 MG tablet Take 2 tablets (650 mg total) by mouth every 8 (eight) hours. Patient taking  differently: Take 650 mg by mouth every 8 (eight) hours as needed for moderate pain or headache.  02/27/16  Yes Mesner, Barbara Cower, MD  atorvastatin (LIPITOR) 10 MG tablet Take 10 mg by mouth every evening.  09/18/15  Yes [provider]  calcium carbonate (TUMS - DOSED IN MG ELEMENTAL CALCIUM) 500 MG chewable tablet Chew 500 mg by mouth as needed for heartburn.   Yes [provider]  levofloxacin (LEVAQUIN) 750 MG tablet Take 1 tablet (750 mg total) by mouth daily. 02/03/17 02/24/17 Yes Maxie Barb, MD  methocarbamol (ROBAXIN) 500 MG tablet Take 1 tablet (500 mg total) by mouth every 8 (eight) hours as needed for muscle spasms. 02/02/17  Yes Maxie Barb, MD  metroNIDAZOLE (FLAGYL) 500 MG tablet Take 1 tablet (500 mg total) by mouth 3 (three) times daily. 02/03/17 02/24/17 Yes Maxie Barb, MD  nystatin (MYCOSTATIN) 100000 UNIT/ML suspension Take by mouth as needed. Thrush. 1 tsp 11/27/16  Yes [provider]  omeprazole (PRILOSEC) 20 MG capsule Take 20 mg by mouth 2 (two) times daily. 11/27/16  Yes [provider]  OVER THE COUNTER MEDICATION Apply 1 application topically as needed (eczema). [employer] Eczema Cream    Yes [provider]  oxyCODONE 10 MG TABS Take 1 tablet (10 mg total) by mouth every 8 (eight) hours as needed for severe pain. 02/02/17  Yes Maxie Barb, MD  sodium chloride flush (NS) 0.9 % SOLN Inject 10 mLs into the vein as needed. 02/03/17  Yes Maxie Barb, MD  traZODone (DESYREL) 100 MG tablet  Take 100 mg by mouth at bedtime.   Yes [provider]  ursodiol (ACTIGALL) 300 MG capsule Take 300 mg by mouth 2 (two) times daily. 11/27/16  Yes [provider]     Family History  Problem Relation Age of Onset  . Arthritis Mother   . Hypertension Mother     Social History   Social History  . Marital status: Married    Spouse name: N/A  . Number of children: 0  . Years of education: 7th    Occupational History  . Construction     Social History Main Topics  . Smoking status: Former Smoker    Packs/day: 1.00    Quit date: 11/27/2013  . Smokeless tobacco: Never Used  . Alcohol use No  . Drug use: No  . Sexual activity: Not on file   Other Topics Concern  . Not on file   Social History Narrative   Drinks about 1 soda a day     Review of Systems: A 12 point ROS discussed and pertinent positives are indicated in the HPI above.  All other systems are negative.  Review of Systems  Constitutional: Negative.   Respiratory: Negative.   Cardiovascular: Negative.   Gastrointestinal: Positive for abdominal pain. Negative for blood in stool, diarrhea, nausea and vomiting.       Mild abdominal pain.  Genitourinary: Negative.   Musculoskeletal: Negative.   Neurological: Negative.     Vital Signs: BP 133/82   Pulse 96   Temp 98.7 F (37.1 C)   Resp 18   Wt 300 lb (136.1 kg)   SpO2 95%   BMI 43.05 kg/m   Physical Exam  Constitutional: He is oriented to person, place, and time. No distress.  Abdominal: Soft. He exhibits no distension. There is no rebound and no guarding.  Neurological: He is alert and oriented to person, place, and time.  Skin: He is not diaphoretic.  Vitals reviewed.   Imaging: Dg Chest 1 View  Result Date: 01/23/2017 CLINICAL DATA:  Status post left-sided thoracentesis EXAM: CHEST 1 VIEW COMPARISON:  January 22, 2017 FINDINGS: No pneumothorax. There remains moderate at least partially loculated pleural effusion on the left. There is a small pleural effusion on the right. There is bibasilar atelectasis. Heart is borderline enlarged with pulmonary venous hypertension. There is no frank edema or consolidation. Central catheter tip is in the superior vena cava. No adenopathy evident. No bone lesions. IMPRESSION: No pneumothorax following thoracentesis. Moderate pleural effusion, at least partially loculated, on the left remains. Small right pleural  effusion present. Bibasilar atelectasis. Underlying pulmonary vascular congestion. Electronically Signed   By: Bretta Bang III M.D.   On: 01/23/2017 12:33   Dg Chest 2 View  Result Date: 01/15/2017 CLINICAL DATA:  Shortness of breath. EXAM: CHEST  2 VIEW COMPARISON:  Radiographs of March 04, 2012. FINDINGS: Stable cardiomediastinal silhouette. No pneumothorax is noted. Hypoinflation of the lungs is noted with mild bibasilar subsegmental atelectasis. Mild left pleural effusion is noted. Bony thorax is unremarkable. IMPRESSION: Hypoinflation of the lungs with mild bibasilar subsegmental atelectasis. Mild left pleural effusion is noted. Electronically Signed   By: Lupita Raider, M.D.   On: 01/15/2017 16:09   Ct Chest Wo Contrast  Result Date: 01/30/2017 CLINICAL DATA:  Evaluate pleural effusion EXAM: CT CHEST WITHOUT CONTRAST TECHNIQUE: Multidetector CT imaging of the chest was performed following the standard protocol without IV contrast. COMPARISON:  01/24/2017 CT and 01/30/2017 CXR FINDINGS: Cardiovascular: PICC line tip  terminates in the brachiocephalic confluence. Heart size is normal. No significant pericardial effusion. No aneurysm of the aorta. Mediastinum/Nodes: Midline trachea. No thyroid mass. No mediastinal adenopathy. Hilum is difficult to evaluate without IV contrast. Esophagus is within normal limits. Lungs/Pleura: Bilateral chest tubes are identified, one coiled along the base of the right lung and the second along the periphery of the left hemithorax. Small ex vacuo pneumothorax status post drainage of left pleural fluid is seen along the posterior aspect of the left lung along the major fissure with small amount of air noted overlying the left lung apex. Slight interval increase in right pleural effusion since prior despite drainage catheter measuring 3.5 cm in thickness versus approximately 2.5 cm in thickness previously. Upper Abdomen: A percutaneous pigtail catheter is again seen  in the left upper quadrant of the abdomen adjacent to the bypassed stomach with further decrease in abscess collection. Second right upper quadrant pigtail drainage catheter overlies the right hepatic lobe with smaller volume of crescentic fluid outlining the liver currently. Musculoskeletal: No acute nor suspicious osseous abnormality. IMPRESSION: 1. Bilateral chest tubes and upper abdominal drainage catheters in place as above. 2. There has been some interval increase in right-sided pleural effusion despite presence of a right basilar chest tube. Small pneumothorax ex vacuo status post drainage left pleural effusion with smaller volume of pleural fluid seen. 3. Decrease in right perihepatic fluid collection since prior as well as adjacent to the bypassed stomach. Electronically Signed   By: Tollie Eth M.D.   On: 01/30/2017 20:54   Ct Chest Wo Contrast  Result Date: 01/24/2017 CLINICAL DATA:  Left pleural effusion status post thoracentesis EXAM: CT CHEST WITHOUT CONTRAST TECHNIQUE: Multidetector CT imaging of the chest was performed following the standard protocol without IV contrast. COMPARISON:  Radiograph 01/23/2017, CT abdomen pelvis 01/19/2017, 01/15/2017 FINDINGS: Cardiovascular: Limited evaluation without intravenous contrast. Non aneurysmal aorta. Right-sided central venous catheter tip is at the brachiocephalic confluence. Normal heart size. No significant pericardial effusion Mediastinum/Nodes: Midline trachea. No thyroid mass. No significantly enlarged mediastinal lymph nodes. Limited assessment of hilar adenopathy. Esophagus within normal limits. Lungs/Pleura: Partial lower lobe consolidations may reflect atelectasis or pneumonia. Moderate left pleural effusion with some loculation anteriorly. Small right-sided pleural effusion. Negative for a pneumothorax Upper Abdomen: Interval placement of pigtail drainage catheter adjacent to the bypassed stomach with decreased abscess collection since previous  CT. Grossly stable perisplenic fluid collection measuring approximately 11.3 x 2.5 cm on axial images. Interval increase in a right perihepatic fluid collection, this measures 15 cm x 6.8 cm by 16.3 cm. Mass effect on the underlying liver. Musculoskeletal: No acute or suspicious bone lesion IMPRESSION: 1. Small right pleural effusion and moderate left pleural effusion with some loculation of the pleural fluid on the left. Negative for a pneumothorax. Partial consolidations in the lower lobes may reflect atelectasis or pneumonia 2. Interim placement of a pigtail drainage catheter adjacent to the gastric sutures/gastro jejunal anastomosis with resolution of previously noted gas and fluid collection 3. Interval increase in size of a right perihepatic fluid collection since the prior CT, measuring at least 16.3 cm. 4. Grossly stable left perisplenic fluid collection Electronically Signed   By: Jasmine Pang M.D.   On: 01/24/2017 19:52   Dg Chest Left Decubitus  Result Date: 01/22/2017 CLINICAL DATA:  Shortness of breath.  Known left pleural effusion. EXAM: CHEST - LEFT DECUBITUS COMPARISON:  Portable chest x-ray of today's date. FINDINGS: There is a moderate size left pleural effusion. There is a  free-flowing component of this E fusion. In addition, there is a small right pleural effusion which layers medially. IMPRESSION: Moderate-sized left pleural effusion with a free-flowing component. Small right pleural effusion with free-flowing component. Electronically Signed   By: David  [place] M.D.   On: 01/22/2017 11:19   Ct Abdomen Pelvis W Contrast  Result Date: 02/11/2017 CLINICAL DATA:  Status post percutaneous drainage catheter placement x 3 to treat intraabdominal abscesses after prior gastric bypass surgery. EXAM: CT ABDOMEN AND PELVIS WITH CONTRAST TECHNIQUE: Multidetector CT imaging of the abdomen and pelvis was performed using the standard protocol following bolus administration of intravenous contrast.  CONTRAST:  125 mL Isovue-300 IV COMPARISON:  01/31/2017 as well as other prior CT studies. FINDINGS: Lower chest: Bibasilar atelectasis and small bilateral pleural effusions. Hepatobiliary: No focal liver abnormality is seen. No gallstones, gallbladder wall thickening, or biliary dilatation. Pancreas: Unremarkable. No pancreatic ductal dilatation or surrounding inflammatory changes. Spleen: Normal in size without focal abnormality. Adrenals/Urinary Tract: Adrenal glands are unremarkable. Kidneys are normal, without renal calculi, focal lesion, or hydronephrosis. Bladder is unremarkable. Stomach/Bowel: No evidence of bowel obstruction or dilated bowel loops. No free air. Vascular/Lymphatic: No vascular abnormalities or enlarged lymph nodes. Reproductive: Prostate is unremarkable. Other: Stable drain positioning lateral to the dome of the liver. There is a small amount of residual adjacent perihepatic fluid that continues inferiorly along the lateral margin of the liver and into the pericolic gutter. Overall amount of fluid tracking inferiorly has decreased since the prior study. Stable positioning of epigastric drain adjacent to the bypassed stomach. Residual abscess shows further decompression with no significant fluid remaining adjacent to the drainage catheter. Stable positioning of pelvic drainage catheter with no further abscess seen. The drainage catheter abuts small bowel loops. Perisplenic fluid remains present lateral to the spleen and measures approximately 4.4 x 8 cm. Overall fluid volume adjacent to the spleen is relatively stable. Musculoskeletal: No acute or significant osseous findings. IMPRESSION: 1. Small amount of perihepatic fluid remains present lateral to the liver and tracking down into the right pericolic gutter. Total amount of fluid appears decreased since the prior study. Stable drain positioning lateral to the dome of the liver. 2. Stable positioning of epigastric drain adjacent to the  stomach without significant abscess present. 3. Stable positioning of pelvic drainage catheter with no further abscess seen in this region. The drain abuts small bowel loops. 4. Relatively stable amount of residual fluid lateral to the spleen. Electronically Signed   By: Irish Lack M.D.   On: 02/11/2017 15:10   Ct Abdomen Pelvis W Contrast  Result Date: 01/31/2017 CLINICAL DATA:  Abscess.  History of complicated gastric bypass. EXAM: CT ABDOMEN AND PELVIS WITH CONTRAST TECHNIQUE: Multidetector CT imaging of the abdomen and pelvis was performed using the standard protocol following bolus administration of intravenous contrast. CONTRAST:  ISOVUE-300 IOPAMIDOL (ISOVUE-300) INJECTION 61% COMPARISON:  01/27/2017 FINDINGS: Lower chest: There are small bilateral pleural effusions with subtle pleural thickening. A left-sided chest tube has been removed since chest CT yesterday. The effusions are not progressed since prior. Multi segment lower lobe atelectasis. Small loculated pneumothorax on the left. Hepatobiliary: No primary abnormality noted. Peri hepatic collection describedbelow. Negative biliary tree. Pancreas: Unremarkable. Spleen: No acute finding.  No evidence of infarct. Adrenals/Urinary Tract: Negative adrenals. Patient has urolithiasis, but currently obscured by contrast excretion. No hydronephrosis. Small renal cysts. Unremarkable bladder. Stomach/Bowel: Status post gastric bypass. The collection interposed between the gastric remnant and pouch is resolved, with catheter in place.  There is a right subphrenic collection extending along the right liver to the level of the paracolic gutter and pelvic inlet. This collection has catheters in the subphrenic and right lower quadrant spaces, both in continuity with the collection (septations could impede flow). The subphrenic collection is significantly decreased from prior, now 12 mm in thickness. Residual predominant pocket measures 16 cm in length by  4 cm in thickness in the paracolic gutter. Lateral left perisplenic collection is mildly increased from 01/19/2017, now 13 x 3 cm as compared to 13 x 2 cm previously. No convincing change from CT 01/27/2017. No new collection is seen. Oral contrast is distributed throughout the colon. None is seen within the collections. Moderate sigmoid diverticulosis. Negative for bowel obstruction. Vascular/Lymphatic: No acute vascular abnormality. Prominent atherosclerotic calcification for age. Reproductive:No pathologic findings. Other: No ascites or pneumoperitoneum. Musculoskeletal: No acute abnormalities. IMPRESSION: 1. Resolved collection between the gastric remnant and gastric pouch, with percutaneous catheter in place. 2. Apparently continuous collection extending from the right subphrenic space to paracolic gutter and right lower quadrant. Subphrenic catheter in good position with significantly decreased collection compared to 01/19/2017. Residual subphrenic collection measures up to 1.5 cm in thickness. The right paracolic gutter collection measures 16 cm in length by 4 cm in maximal thickness. 3. Left peri splenic/subphrenic collection is 13 cm craniocaudal and 3 cm in thickness. Collection is mildly increased from 01/19/2017. 4. Small bilateral complex pleural effusion with multi segment atelectasis. Left-sided chest tube has been removed since CT yesterday. 5. No extravasation of oral contrast, which reached the colon. Electronically Signed   By: Marnee Spring M.D.   On: 01/31/2017 17:20   Ct Abdomen Pelvis W Contrast  Result Date: 01/19/2017 CLINICAL DATA:  Follow-up intestinal bypass and anastomosis status EXAM: CT ABDOMEN AND PELVIS WITH CONTRAST TECHNIQUE: Multidetector CT imaging of the abdomen and pelvis was performed using the standard protocol following bolus administration of intravenous contrast. CONTRAST:  100 mL Isovue-300 intravenous COMPARISON:  01/16/2017, 01/15/2017 FINDINGS: Lower chest:  Moderate left-sided pleural effusion and small right pleural effusion, slightly increased compared to prior. Bilateral lower lobe consolidations may reflect atelectasis or pneumonia. Heart size is within normal limits. Hepatobiliary: Small hypodense collection along the left margin of the liver. Decreased rim enhancing sub capsular fluid around the liver. Residual lenticular shaped collection measuring 16 cm in AP by 3.3 cm transverse. No calcified gallstones. No biliary dilatation. Pancreas: Unremarkable. No pancreatic ductal dilatation or surrounding inflammatory changes. Spleen: Normal in size without focal abnormality. Decreased rim enhancing left perisplenic fluid collection, measuring 11.7 cm by 2.2 cm compared with 12.1 x 2.4 cm previously. Adrenals/Urinary Tract: Adrenal glands are within normal limits. 3 mm stone in the upper pole of the left kidney. No hydronephrosis. The bladder is unremarkable small cyst lower pole left kidney. Stomach/Bowel: Patient is status post gastric bypass. Slight increased size of a gas and fluid collection, adjacent to the suture line, this measures 10.9 x 5.8 cm compared with 10.7 x 5 cm previously. It is now contiguous with a small gas and fluid collection between the gastric pouch and anterior aspect of the liver. Additional hypodense fluid collection measuring about 4.7 by 2.4 cm adjacent to the pyloric region of the excluded stomach. Previously this measured 2.5 x 4.3 cm. There is no evidence for a bowel obstruction. There is mild wall thickening of the hepatic flexure and right colon. Vascular/Lymphatic: Aortic atherosclerosis. No enlarged abdominal or pelvic lymph nodes. Reproductive: Prostate is unremarkable. Other: Increased fluid in  the pelvis/presacral space. Interval placement of a right lower percutaneous drainage catheter with markedly decreased in size of the previously noted large peritoneal fluid collection. No discrete residual measurable collection at this  time. Small scattered rim enhancing fluid collections are visualized in the right gutter, inferior margin of the liver, and in the left upper quadrant of the abdomen. Musculoskeletal: Degenerative changes. No acute or suspicious findings. IMPRESSION: 1. Interim placement of right lower quadrant percutaneous drainage catheter with significant decrease in size of previously noted large intraperitoneal fluid collection. Interval decrease in size of large right perihepatic and perisplenic rim enhancing fluid collections with residuals as described above. 2. Slight interval increase in size of a gas and fluid containing collection adjacent to the gastric sutures/gastro jejunal anastomosis. Gas and fluid collection now extend slightly more anterior, between the gastric pouch and the liver. Given that the other collections have decreased in size, findings would be concerning for dehiscence/anastomotic leak. 3. Additional scattered small rim enhancing fluid collections within the abdomen and pelvis. 4. Mild wall thickening of the ascending colon and hepatic flexure suggesting a colitis or reactive inflammation. Residual hazy edema and inflammation of the central and right upper quadrant mesentery. 5. Slight increased pleural effusions with continued bilateral lower lobe atelectasis or pneumonia 6. Nonobstructing stone in the left kidney Electronically Signed   By: Jasmine Pang M.D.   On: 01/19/2017 21:19   Ct Abdomen Pelvis W Contrast  Result Date: 01/15/2017 CLINICAL DATA:  Abdominopelvic pain. Status post gastric bypass surgery 11/28/2016. Elevated alkaline phosphatase. EXAM: CT ABDOMEN AND PELVIS WITH CONTRAST TECHNIQUE: Multidetector CT imaging of the abdomen and pelvis was performed using the standard protocol following bolus administration of intravenous contrast. CONTRAST:  ISOVUE-300 IOPAMIDOL (ISOVUE-300) INJECTION 61% COMPARISON:  04/16/2015 pelvic CT.  02/28/2012 CT abdomen/pelvis. FINDINGS: Lower  chest: Small left and trace right dependent bilateral pleural effusions with moderate bibasilar atelectasis predominantly involving the lower lobes and right middle lobe. Hepatobiliary: There is a large lentiform right perihepatic abscess measuring up to 20.0 x 11.2 cm with internal gas and with thick enhancing wall (series 3/image 17), which demonstrates prominent mass-effect on the right liver capsule. No liver mass. Normal gallbladder with no radiopaque cholelithiasis. No biliary ductal dilatation. Pancreas: Normal, with no mass or duct dilation. Spleen: There is a lentiform 12.1 x 2.4 cm thick walled fluid collection in the lateral perisplenic space (series 3/ image 26) with mass-effect on the lateral splenic capsule. Normal spleen size. No splenic mass. Adrenals/Urinary Tract: Normal adrenals. Nonobstructing 2 mm upper left renal stone. No hydronephrosis. Simple 1.8 cm renal cyst in the posterior lower left kidney. Otherwise normal kidneys, with no hydronephrosis. Normal bladder. Stomach/Bowel: Status post gastric bypass surgery. There is a gas containing abscess with thick enhancing wall measuring 10.7 x 5.0 cm abutting the posterior margin of the gastrojejunal anastomosis (series 3/ image 20). Smaller 4.3 x 2.5 cm fluid collection with thick enhancing wall along the greater curvature in the distal collapsed excluded stomach (series 3/image 32). No small bowel wall thickening. Mildly dilated mid small bowel loops with air-fluid levels, measuring up to 3.7 cm diameter. No discrete small bowel caliber transition. Appendix not discretely visualized. Mild sigmoid diverticulosis. No large bowel wall thickening. Vascular/Lymphatic: Atherosclerotic nonaneurysmal abdominal aorta. Patent hepatic, portal, splenic and renal veins. No pathologically enlarged lymph nodes in the abdomen or pelvis. Reproductive: Normal size prostate. Other: There is diffuse peritoneal thickening and hyperenhancement. There is a large  loculated irregularly-shaped fluid collection conforming to the  mid to lower peritoneal space measuring up to 27.8 x 19.6 cm (series 3/ image 75), which is continuous with the right perihepatic space collection. Musculoskeletal: No aggressive appearing focal osseous lesions. Mild thoracolumbar spondylosis. IMPRESSION: 1. Gas containing 10.7 x 5.0 cm abscess abutting the posterior margin of the gastrojejunal anastomosis, worrisome for dehiscence of the gastrojejunal anastomosis. 2. Diffuse peritonitis with multiple large gas-containing abscesses throughout the peritoneal cavity, including a large right perihepatic abscess compressing the right liver lobe. 3. Mildly dilated mid small bowel loops with air-fluid levels, favor mild adynamic ileus. 4. Small left and trace right dependent bilateral pleural effusions with moderate bibasilar atelectasis. 5. Nonobstructing tiny left renal stone. 6.  Aortic Atherosclerosis (ICD10-I70.0). These results were called by telephone at the time of interpretation on 01/15/2017 at 12:41 pm to Dr. Alysia Penna , who verbally acknowledged these results. Electronically Signed   By: Delbert Phenix M.D.   On: 01/15/2017 12:42   Dg Chest Port 1 View  Result Date: 02/01/2017 CLINICAL DATA:  Shortness of breath. EXAM: PORTABLE CHEST 1 VIEW COMPARISON:  01/30/2017 FINDINGS: Right arm PICC tip is in the subclavian vein at the SVC subclavian junction. Drainage catheter remains evident in the right upper quadrant. Left-sided drain is been removed. Bilateral effusions with atelectasis in both lower lungs. IMPRESSION: Bilateral effusions with atelectasis in both lower lungs. Electronically Signed   By: Paulina Fusi M.D.   On: 02/01/2017 09:43   Dg Chest Port 1 View  Result Date: 01/30/2017 CLINICAL DATA:  Pleural effusion.  Chest tubes. EXAM: PORTABLE CHEST 1 VIEW COMPARISON:  01/29/2017 . FINDINGS: Bilateral chest tubes in stable position. Right PICC line stable position. Cardiomegaly with  diffuse bilateral pulmonary infiltrates/edema and bilateral pleural effusions. No pneumothorax. IMPRESSION: 1. Bilateral chest tubes and right PICC line in stable position. No pneumothorax. 2. Cardiomegaly with diffuse bilateral pulmonary infiltrates/ edema and bilateral pleural effusions. Findings consistent CHF. No interim change from prior exam. Electronically Signed   By: Maisie Fus  Register   On: 01/30/2017 07:15   Dg Chest Port 1 View  Result Date: 01/29/2017 CLINICAL DATA:  Pleural effusion.  Chest tube. EXAM: PORTABLE CHEST 1 VIEW COMPARISON:  01/28/2017 . FINDINGS: Bilateral chest tubes again noted . right PICC line stable position. Cardiomegaly with bilateral pulmonary infiltrates most consistent pulmonary edema. Small bilateral pleural effusions again noted. IMPRESSION: 1. Bilateral chest tubes and right PICC line stable position. No pneumothorax. 2. Findings consistent congestive heart failure bilateral from interstitial edema bilateral pleural effusions again noted. No interim change . Electronically Signed   By: Maisie Fus  Register   On: 01/29/2017 07:29   Dg Chest Port 1 View  Result Date: 01/28/2017 CLINICAL DATA:  Follow-up bilateral pleural effusions with chest tube treatment. The patient underwent recent bariatric surgery. EXAM: PORTABLE CHEST 1 VIEW COMPARISON:  Portable chest x-ray of January 27, 2017 FINDINGS: The lungs are well-expanded. Pleural fluid layer sposteriorly and laterally, bilaterally. The small caliber pigtail pleural drainage catheters are in stable position at the lung bases. The hemidiaphragms remain obscured. The cardiac silhouette is mildly enlarged. The central pulmonary vascularity is prominent. The interstitial markings remain increased bilaterally The PICC line tip projects over the proximal SVC. The bony thorax exhibits no acute abnormality. IMPRESSION: Stable appearance of the chest since yesterday's study. Persistent bilateral pleural effusions layering inferiorly  and posteriorly. Mild cardiomegaly and central pulmonary vascular congestion with mild pulmonary interstitial edema. Electronically Signed   By: David  [place] M.D.   On: 01/28/2017 07:36  Dg Chest Port 1 View  Result Date: 01/27/2017 CLINICAL DATA:  Pleural effusions EXAM: PORTABLE CHEST 1 VIEW COMPARISON:  01/26/2017 FINDINGS: Pigtail catheters project over each lung base with hazy adjacent opacities presumably due to layering effusions and adjacent atelectasis. No pneumothorax is identified. Right-sided PICC line tip is seen in the mid SVC. Heart is borderline enlarged with mild vascular congestion. No acute nor suspicious osseous abnormalities. IMPRESSION: 1. New pigtail catheters are seen at each lung base superimposed on presumed layering pleural effusions and atelectasis. 2. No pneumothorax. 3. Stable cardiomegaly. Electronically Signed   By: Tollie Eth M.D.   On: 01/27/2017 19:54   Dg Chest Port 1 View  Result Date: 01/26/2017 CLINICAL DATA:  Pleural effusion EXAM: PORTABLE CHEST 1 VIEW COMPARISON:  01/23/2017 FINDINGS: Stable right upper extremity PICC. Stable bilateral pleural effusions and bilateral predominately basilar airspace disease left greater than right. No pneumothorax. IMPRESSION: Stable bilateral pleural effusions and bilateral airspace disease. Electronically Signed   By: Jolaine Click M.D.   On: 01/26/2017 07:20   Dg Chest Port 1 View  Result Date: 01/21/2017 CLINICAL DATA:  Pleural effusion.  PICC line placement. EXAM: PORTABLE CHEST 1 VIEW COMPARISON:  Two-view chest x-ray 01/15/2017 FINDINGS: A right-sided PICC line is in place.  The tip is in the mid SVC. A left pleural effusion has increased. A small right pleural effusion is again noted. Bibasilar airspace disease likely reflects atelectasis. Low lung volumes exaggerate the heart size. Mild pulmonary vascular congestion has increased. IMPRESSION: 1. Increasing left pleural effusion. 2. Cardiomegaly and progressive pulmonary  vascular congestion. 3. Interval placement of right-sided PICC line. The tip is in the mid SVC. 4. Bibasilar airspace disease likely reflects atelectasis. Electronically Signed   By: Marin Roberts M.D.   On: 01/21/2017 16:48   Dg Kayleen Memos W/water Sol Cm  Result Date: 01/23/2017 CLINICAL DATA:  Inpatient. 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 Status post right lower quadrant percutaneous drain placement 01/16/2017. Status post left upper quadrant percutaneous drain placement 01/21/2017. EXAM: WATER SOLUBLE UPPER GI SERIES TECHNIQUE: Single-column upper GI series was performed using water soluble contrast. CONTRAST:  75 cc Isovue-300 oral contrast. COMPARISON:  01/19/2017 CT abdomen/pelvis. FLUOROSCOPY TIME:  Fluoroscopy Time:  3 minutes 42 seconds. Number of Acquired Spot Images: 15 FINDINGS: Percutaneous pigtail drainage catheter terminates in the medial left upper quadrant. Surgical sutures are also noted in the medial left upper quadrant. Esophagus is normal in distensibility with no gross esophageal filling defects or strictures. Patient is status post gastric bypass surgery. The small gastric pouch appears intact with no filling defects or ulcers. The gastrojejunostomy appears intact with no evidence of anastomotic leak or significant narrowing at the anastomosis. Contrast passes into the normal caliber jejunal limb and proximal small bowel loops without delay. No evidence of oral contrast in the excluded distal stomach . IMPRESSION: No evidence of anastomotic leak or stricture at the gastrojejunal anastomosis. Intact gastric pouch without appreciable complication. Electronically Signed   By: Delbert Phenix M.D.   On: 01/23/2017 12:02   Ct Image Guided Drainage By Percutaneous Catheter  Result Date: 01/27/2017 INDICATION: 41 year old male with a history of gastric bypass surgery and multiple intra-abdominal fluid collections and  exited infusion of the left chest. EXAM: CT-GUIDED DRAIN OF RIGHT PERI HEPATIC/SUBCAPSULAR FLUID. CT-GUIDED LEFT CHEST TUBE PLACEMENT MEDICATIONS: The patient is currently admitted to the hospital and receiving intravenous antibiotics. The antibiotics were administered within an appropriate time frame prior  to the initiation of the procedure. ANESTHESIA/SEDATION: 3.0 mg IV Versed 200 mcg IV Fentanyl Moderate Sedation Time:  31 minutes The patient was continuously monitored during the procedure by the interventional radiology nurse under my direct supervision. COMPLICATIONS: None TECHNIQUE: Informed written consent was obtained from the patient after a thorough discussion of the procedural risks, benefits and alternatives. All questions were addressed. Maximal Sterile Barrier Technique was utilized including caps, mask, sterile gowns, sterile gloves, sterile drape, hand hygiene and skin antiseptic. A timeout was performed prior to the initiation of the procedure. PROCEDURE: Patient is positioned supine position on the CT gantry table. Scout CT image of the upper abdomen and lower chest performed for planning purposes. The right upper abdomen was addressed. The right upper abdomen was prepped with chlorhexidine in a sterile fashion, and a sterile drape was applied covering the operative field. A sterile gown and sterile gloves were used for the procedure. Local anesthesia was provided with 1% Lidocaine. Once the patient is prepped and draped in the usual sterile fashion, the skin and subcutaneous tissues were generously infiltrated 1% lidocaine for local anesthesia. Using a trocar needle, the fluid at the right peripheral liver was approach. Once we confirmed the needle tip within the fluid, modified Seldinger technique was used to place a 12 [place] drain. Drain was sutured in position and attached to gravity drainage. The left chest was then addressed. The lower left chest was prepped with chlorhexidine in a sterile  fashion common sterile drape was applied covering the operative field. Sterile gown and gloves were used. Local anesthesia provided with 1% lidocaine. Once the patient was prepped and draped in the usual sterile fashion, the skin and subcutaneous tissues were generously infiltrated 1% lidocaine for local anesthesia. Using CT guidance, trocar needle was advanced into the fluid of the left pleural space. Using modified Seldinger technique, a 10 French drain was placed. This was attached to water seal chamber. Culture from the abdomen and the chest were sent to the lab. Final CT image was acquired. Patient tolerated the procedure well and remained hemodynamically stable throughout. No complications were encountered and no significant blood loss. FINDINGS: Initial CT image demonstrates perihepatic fluid collection of the right aspect of the liver, likely subcapsular given the configuration. Unchanged from prior. Drainage catheter terminates in the midline adjacent to the surgical changes of gastric bypass, similar prior. Small fluid adjacent to the spleen. Unchanged appearance of left pleural fluid. Status post drainage of right upper abdomen and left chest fluid, decreased size of the fluid collection of the right abdomen, with unchanged size of fluid collection in the chest. Paragraphs sample was sent for me age fluid location. IMPRESSION: Status post CT-guided 12 French drain placement of the right upper abdomen, and CT-guided left thoracostomy tube with 10 French drain placed. Sample was sent to the lab for culture from each location. Signed, Yvone Neu. Loreta Ave, DO [facility] Radiology Electronically Signed   By: Gilmer Mor D.O.   On: 01/27/2017 15:32   Ct Image Guided Drainage By Percutaneous Catheter  Result Date: 01/27/2017 INDICATION: 41 year old male with a history of gastric bypass surgery and multiple intra-abdominal fluid collections and exited infusion  of the left chest. EXAM: CT-GUIDED DRAIN OF RIGHT PERI HEPATIC/SUBCAPSULAR FLUID. CT-GUIDED LEFT CHEST TUBE PLACEMENT MEDICATIONS: The patient is currently admitted to the hospital and receiving intravenous antibiotics. The antibiotics were administered within an appropriate time frame prior to the initiation of the procedure. ANESTHESIA/SEDATION: 3.0 mg IV Versed  200 mcg IV Fentanyl Moderate Sedation Time:  31 minutes The patient was continuously monitored during the procedure by the interventional radiology nurse under my direct supervision. COMPLICATIONS: None TECHNIQUE: Informed written consent was obtained from the patient after a thorough discussion of the procedural risks, benefits and alternatives. All questions were addressed. Maximal Sterile Barrier Technique was utilized including caps, mask, sterile gowns, sterile gloves, sterile drape, hand hygiene and skin antiseptic. A timeout was performed prior to the initiation of the procedure. PROCEDURE: Patient is positioned supine position on the CT gantry table. Scout CT image of the upper abdomen and lower chest performed for planning purposes. The right upper abdomen was addressed. The right upper abdomen was prepped with chlorhexidine in a sterile fashion, and a sterile drape was applied covering the operative field. A sterile gown and sterile gloves were used for the procedure. Local anesthesia was provided with 1% Lidocaine. Once the patient is prepped and draped in the usual sterile fashion, the skin and subcutaneous tissues were generously infiltrated 1% lidocaine for local anesthesia. Using a trocar needle, the fluid at the right peripheral liver was approach. Once we confirmed the needle tip within the fluid, modified Seldinger technique was used to place a 12 [place] drain. Drain was sutured in position and attached to gravity drainage. The left chest was then addressed. The lower left chest was prepped with chlorhexidine in a sterile fashion common  sterile drape was applied covering the operative field. Sterile gown and gloves were used. Local anesthesia provided with 1% lidocaine. Once the patient was prepped and draped in the usual sterile fashion, the skin and subcutaneous tissues were generously infiltrated 1% lidocaine for local anesthesia. Using CT guidance, trocar needle was advanced into the fluid of the left pleural space. Using modified Seldinger technique, a 10 French drain was placed. This was attached to water seal chamber. Culture from the abdomen and the chest were sent to the lab. Final CT image was acquired. Patient tolerated the procedure well and remained hemodynamically stable throughout. No complications were encountered and no significant blood loss. FINDINGS: Initial CT image demonstrates perihepatic fluid collection of the right aspect of the liver, likely subcapsular given the configuration. Unchanged from prior. Drainage catheter terminates in the midline adjacent to the surgical changes of gastric bypass, similar prior. Small fluid adjacent to the spleen. Unchanged appearance of left pleural fluid. Status post drainage of right upper abdomen and left chest fluid, decreased size of the fluid collection of the right abdomen, with unchanged size of fluid collection in the chest. Paragraphs sample was sent for me age fluid location. IMPRESSION: Status post CT-guided 12 French drain placement of the right upper abdomen, and CT-guided left thoracostomy tube with 10 French drain placed. Sample was sent to the lab for culture from each location. Signed, Yvone Neu. Loreta Ave, DO [facility] Radiology Electronically Signed   By: Gilmer Mor D.O.   On: 01/27/2017 15:32   Ct Image Guided Drainage By Percutaneous Catheter  Result Date: 01/21/2017 INDICATION: History of reactive surgery performed at outside institution, post placement of CT-guided percutaneous drainage catheter within the right  lower abdomen / pelvis on 01/16/2017 with subsequent abdominal CT performed 01/19/2017 demonstrating a residual perigastric abscess. Request made for placement of a additional percutaneous drainage catheter for infection source control purposes. EXAM: CT IMAGE GUIDED DRAINAGE BY PERCUTANEOUS CATHETER COMPARISON:  CT abdomen pelvis - 01/19/2017; 01/15/2017; CT-guided percutaneous drainage catheter placement - 01/16/2017 MEDICATIONS: The patient is currently admitted  to the hospital and receiving intravenous antibiotics. The antibiotics were administered within an appropriate time frame prior to the initiation of the procedure. ANESTHESIA/SEDATION: Moderate (conscious) sedation was employed during this procedure. A total of Versed 2 mg and Fentanyl 100 mcg was administered intravenously. Moderate Sedation Time: 15 minutes. The patient's level of consciousness and vital signs were monitored continuously by radiology nursing throughout the procedure under my direct supervision. CONTRAST:  None COMPLICATIONS: None immediate. PROCEDURE: Informed written consent was obtained from the patient after a discussion of the risks, benefits and alternatives to treatment. The patient was placed supine on the CT gantry and a pre procedural CT was performed re-demonstrating the known abscess/fluid collection within the upper abdomen adjacent to the stomach and gastric anastomosis with dominant component measuring approximately 3.7 x 10.6 cm (image 24, series 3). The procedure was planned. A timeout was performed prior to the initiation of the procedure. The skin overlying the midline of the upper abdomen was prepped and draped in the usual sterile fashion. The overlying soft tissues were anesthetized with 1% lidocaine with epinephrine. Appropriate trajectory was planned with the use of a 22 gauge spinal needle. An 18 gauge trocar needle was advanced into the abscess/fluid collection and a short Amplatz super stiff wire was coiled  within the collection. Appropriate positioning was confirmed with a limited CT scan. The tract was serially dilated allowing placement of a 10 [place] all-purpose drainage catheter. Appropriate positioning was confirmed with a limited postprocedural CT scan. Approximately 100 cc of purulent fluid was aspirated. The tube was connected to a drainage bag and sutured in place. A dressing was placed. The patient tolerated the procedure well without immediate post procedural complication. IMPRESSION: Successful CT guided placement of a 10 French all purpose drain catheter into the residual perigastric abscess with aspiration of 100 cc of purulent fluid. Samples were sent to the laboratory as requested by the ordering clinical team. Electronically Signed   By: Simonne Come M.D.   On: 01/21/2017 10:11   Ct Image Guided Drainage By Percutaneous Catheter  Result Date: 01/16/2017 CLINICAL DATA:  Previous bariatric surgery with intraabdominal fluid collections. EXAM: CT GUIDED DRAINAGE OF PERITONEAL ABSCESS ANESTHESIA/SEDATION: Intravenous Fentanyl and Versed were administered as conscious sedation during continuous monitoring of the patient's level of consciousness and physiological / cardiorespiratory status by the radiology RN, with a total moderate sedation time of twelve minutes. PROCEDURE: The procedure, risks, benefits, and alternatives were explained to the patient. Questions regarding the procedure were encouraged and answered. The patient understands and consents to the procedure. Select axial scans through the abdomen were obtained. The fluid collection was localized and an appropriate skin entry site was determined and marked. The operative field was prepped with chlorhexidinein a sterile fashion, and a sterile drape was applied covering the operative field. A sterile gown and sterile gloves were used for the procedure. Local anesthesia was provided with 1% Lidocaine. Under CT fluoroscopic guidance, a 19 gauge  percutaneous entry needle was advanced into the fluid. Cloudy thin fluid could be aspirated, and an Amplatz wire advanced easily within the collection, its position confirmed on CT. Tract was dilated to facilitate placement of a 14 French pigtail catheter, placed within the dependent aspect of the dominant component of the collection. 20 mL of the aspirate was sent for Gram stain and culture. Catheter secured externally with 0 Prolene suture and StatLock and placed to gravity drain bag. The patient tolerated the procedure well. COMPLICATIONS: None immediate FINDINGS: Large peritoneal fluid  collection was again localized. 14French drain tube placed as above. Sample of the aspirate sent for Gram stain and culture. IMPRESSION: 1. Technically successful CT-guided right lower quadrant peritoneal abscess drain catheter placement Electronically Signed   By: Corlis Leak M.D.   On: 01/16/2017 13:34   Ir Thoracentesis Asp Pleural Space W/img Guide  Result Date: 01/23/2017 INDICATION: Status post gastric bypass surgery. Now with multiple intra-abdominal fluid collection that have been drained. Left pleural effusion noted on recent imaging with some dyspnea. Request is made for diagnostic and therapeutic thoracentesis. EXAM: ULTRASOUND GUIDED LEFT THORACENTESIS MEDICATIONS: 1% xylocaine COMPLICATIONS: None immediate. PROCEDURE: An ultrasound guided thoracentesis was thoroughly discussed with the patient and questions answered. The benefits, risks, alternatives and complications were also discussed. The patient understands and wishes to proceed with the procedure. Written consent was obtained. Ultrasound was performed to localize and mark an adequate pocket of fluid in the left chest. The area was then prepped and draped in the normal sterile fashion. 1% Lidocaine was used for local anesthesia. Under ultrasound guidance a Safe-T-Centesis catheter was introduced. Thoracentesis was performed. The catheter was removed and a  dressing applied. FINDINGS: A total of approximately 25 cc of serosanguineous fluid was removed. Samples were sent to the laboratory as requested by the clinical team. The fluid appeared to have some complexity to it. The right side was imaged as well and no effusion was noted. IMPRESSION: Successful ultrasound guided left thoracentesis yielding 25 cc of pleural fluid. Read by: Barnetta Chapel, PA-C Electronically Signed   By: Malachy Moan M.D.   On: 01/23/2017 13:24    Labs:  CBC:  Recent Labs  01/26/17 0433 01/27/17 0551 01/31/17 0449 02/01/17 0459  WBC 5.5 5.4 4.6 3.9*  HGB 8.2* 8.2* 8.2* 8.4*  HCT 27.9* 27.3* 27.3* 27.3*  PLT 432* 444* 392 390    COAGS:  Recent Labs  01/15/17 1420 01/20/17 1744 01/23/17 0723  INR 1.07 1.19 1.12    BMP:  Recent Labs  01/29/17 0405 01/31/17 0449 02/01/17 0459 02/02/17 0429  NA 133* 134* 135 137  K 4.0 4.0 3.8 3.8  CL 98* 100* 101 102  CO2 28 28 27 28   GLUCOSE 131* 104* 98 104*  BUN 9 8 6  <5*  CALCIUM 8.2* 8.1* 8.1* 8.0*  CREATININE 0.46* 0.59* 0.54* 0.52*  GFRNONAA >60 >60 >60 >60  GFRAA >60 >60 >60 >60    LIVER FUNCTION TESTS:  Recent Labs  01/22/17 0553 01/23/17 1248 01/26/17 0433 01/27/17 1937 01/29/17 0405  BILITOT 0.6 0.8 0.4  --  0.2*  AST 30 27 18   --  17  ALT 18 19 15*  --  10*  ALKPHOS 139* 175* 116  --  83  PROT 5.7* 6.7 6.4*  --  6.0*  ALBUMIN 1.6* 1.9* 1.9* 1.7* 1.8*     Assessment and Plan:  CT today demonstrates decompressed perigastric abscess. Injection of the perigastric abscess drain demonstrates filling of a small cavity and no fistula to the stomach. Given no output from this drain, the drainage catheter was removed today.  CT demonstrates no evidence of further pelvic abscess at the level of the indwelling drainage catheter. 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 I told Mr. Marut that there may be utility in exchanging this drain and  gradually trying to retract it to try to close the fistula to the small bowel. We will arrange for a drain exchange at the hospital in 1-2 weeks.  CT demonstrates decrease in size of the perihepatic fluid collection that extends into the right paracolic gutter. The perihepatic drainage catheter still has some output and will be left in place for at least another week or two.  Findings and plan were reviewed with the patient's wife.  Electronically SignedIrish Lack T 02/11/2017, 3:26 PM   I spent a total of 25 Minutes in face to face in clinical consultation, greater than 50% of which was counseling/coordinating care for abscess drain management.

## 2017-02-17 ENCOUNTER — Other Ambulatory Visit: Payer: Self-pay | Admitting: Student

## 2017-02-18 ENCOUNTER — Ambulatory Visit (HOSPITAL_COMMUNITY)
Admission: RE | Admit: 2017-02-18 | Discharge: 2017-02-18 | Disposition: A | Discharge status: Home / Self Care | Payer: 59 | Source: Ambulatory Visit | Attending: Interventional Radiology | Admitting: Interventional Radiology

## 2017-02-18 ENCOUNTER — Encounter (HOSPITAL_COMMUNITY): Payer: Self-pay | Admitting: Interventional Radiology

## 2017-02-18 ENCOUNTER — Other Ambulatory Visit (HOSPITAL_COMMUNITY): Payer: Self-pay | Admitting: Interventional Radiology

## 2017-02-18 DIAGNOSIS — E119 Type 2 diabetes mellitus without complications: Secondary | ICD-10-CM | POA: Insufficient documentation

## 2017-02-18 DIAGNOSIS — K651 Peritoneal abscess: Secondary | ICD-10-CM | POA: Diagnosis not present

## 2017-02-18 DIAGNOSIS — T8143XD Infection following a procedure, organ and space surgical site, subsequent encounter: Secondary | ICD-10-CM | POA: Insufficient documentation

## 2017-02-18 DIAGNOSIS — T8149XA Infection following a procedure, other surgical site, initial encounter: Principal | ICD-10-CM

## 2017-02-18 DIAGNOSIS — Z9884 Bariatric surgery status: Secondary | ICD-10-CM | POA: Diagnosis not present

## 2017-02-18 DIAGNOSIS — K6811 Postprocedural retroperitoneal abscess: Secondary | ICD-10-CM | POA: Insufficient documentation

## 2017-02-18 DIAGNOSIS — T8143XA Infection following a procedure, organ and space surgical site, initial encounter: Secondary | ICD-10-CM

## 2017-02-18 DIAGNOSIS — Z6841 Body Mass Index (BMI) 40.0 and over, adult: Secondary | ICD-10-CM | POA: Diagnosis not present

## 2017-02-18 DIAGNOSIS — E785 Hyperlipidemia, unspecified: Secondary | ICD-10-CM | POA: Diagnosis not present

## 2017-02-18 DIAGNOSIS — Y832 Surgical operation with anastomosis, bypass or graft as the cause of abnormal reaction of the patient, or of later complication, without mention of misadventure at the time of the procedure: Secondary | ICD-10-CM | POA: Diagnosis not present

## 2017-02-18 DIAGNOSIS — Z87891 Personal history of nicotine dependence: Secondary | ICD-10-CM | POA: Insufficient documentation

## 2017-02-18 DIAGNOSIS — Z4803 Encounter for change or removal of drains: Secondary | ICD-10-CM | POA: Insufficient documentation

## 2017-02-18 HISTORY — PX: IR CATHETER TUBE CHANGE: IMG717

## 2017-02-18 LAB — GLUCOSE, CAPILLARY: Glucose-Capillary: 123 mg/dL — ABNORMAL HIGH (ref 65–99)

## 2017-02-18 MED ORDER — FENTANYL CITRATE (PF) 100 MCG/2ML IJ SOLN
INTRAMUSCULAR | Status: AC | PRN
  Administered 2017-02-18 (×2): 25 ug via INTRAVENOUS
  Administered 2017-02-18: 50 ug via INTRAVENOUS

## 2017-02-18 MED ORDER — IOPAMIDOL (ISOVUE-300) INJECTION 61%
INTRAVENOUS | Status: AC
  Administered 2017-02-18: 15 mL
  Filled 2017-02-18: qty 50

## 2017-02-18 MED ORDER — SODIUM CHLORIDE 0.9 % IV SOLN
INTRAVENOUS | Status: AC | PRN
  Administered 2017-02-18: 10 mL/h via INTRAVENOUS

## 2017-02-18 MED ORDER — SODIUM CHLORIDE 0.9 % IV SOLN: INTRAVENOUS | Status: DC

## 2017-02-18 MED ORDER — MIDAZOLAM HCL 2 MG/2ML IJ SOLN
INTRAMUSCULAR | Status: AC
  Filled 2017-02-18: qty 2

## 2017-02-18 MED ORDER — FENTANYL CITRATE (PF) 100 MCG/2ML IJ SOLN
INTRAMUSCULAR | Status: AC
  Filled 2017-02-18: qty 2

## 2017-02-18 MED ORDER — MIDAZOLAM HCL 2 MG/2ML IJ SOLN
INTRAMUSCULAR | Status: AC | PRN
  Administered 2017-02-18 (×3): 1 mg via INTRAVENOUS

## 2017-02-18 MED ORDER — LIDOCAINE HCL 1 % IJ SOLN
INTRAMUSCULAR | Status: AC
  Filled 2017-02-18: qty 20

## 2017-02-18 NOTE — H&P (Signed)
Chief Complaint: Patient was seen in consultation today for abdominal abscess  Referring Physician(s): Dr. Abigail Miyamoto  Supervising Physician: Irish Lack  Patient Status: [facility] - Out-pt  History of Present Illness: Albert White is a 41 y.o. male who underwent Roux-en-Y bypass on 12/08/16 who developed post-op intra abdominal abscesses.  Patient underwent drain placement x3.  He was seen in clinic with Dr. Fredia Sorrow on 02/11/17 at which time his perigastric abscess drain was removed and the perihepatic and RLQ drains were left in place after drain injection showed: 1. Injection of the epigastric percutaneous drain demonstrates filling of a small cavity without evidence of fistula to the adjacent stomach. This drain was removed. 2. Injection of the pelvic drainage catheter demonstrates a fistula to adjacent small bowel. This catheter was reconnected to gravity bag drainage. 3. The perihepatic catheter was left in place and connected to a new gravity drainage bag.  He presents to the radiology department today for per-haptic drain removal and RLQ drain exchange with sedation.   He has been NPO.  He does not take blood thinners.   Past Medical History:  Diagnosis Date  . Abscess   . Diabetes mellitus without complication (HCC)   . Hidradenitis suppurativa   . Hyperlipemia   . Morbid obesity (HCC)   . Vertigo     Past Surgical History:  Procedure Laterality Date  . INCISION AND DRAINAGE ABSCESS Right 12/15/2013   Procedure: INCISION AND DRAINAGE Right Posterior neck ABSCESS;  Surgeon: Cherylynn Ridges, MD;  Location: [facility] OR;  Service: General;  Laterality: Right;  . INCISION AND DRAINAGE DEEP NECK ABSCESS     Hidradenitis - 3-4 times at [facility] and [place]  . IR THORACENTESIS ASP PLEURAL SPACE W/IMG GUIDE  01/23/2017    Allergies: Patient has no known allergies.  Medications: Prior to Admission medications   Medication Sig Start Date End Date Taking? Authorizing  Provider  acetaminophen (TYLENOL) 325 MG tablet Take 2 tablets (650 mg total) by mouth every 8 (eight) hours. Patient taking differently: Take 650 mg by mouth every 8 (eight) hours as needed for moderate pain or headache.  02/27/16  Yes Mesner, Barbara Cower, MD  atorvastatin (LIPITOR) 10 MG tablet Take 10 mg by mouth every evening.  09/18/15  Yes [provider]  calcium carbonate (TUMS - DOSED IN MG ELEMENTAL CALCIUM) 500 MG chewable tablet Chew 500 mg by mouth as needed for heartburn.   Yes [provider]  levofloxacin (LEVAQUIN) 750 MG tablet Take 1 tablet (750 mg total) by mouth daily. 02/03/17 02/24/17 Yes Maxie Barb, MD  metroNIDAZOLE (FLAGYL) 500 MG tablet Take 1 tablet (500 mg total) by mouth 3 (three) times daily. 02/03/17 02/24/17 Yes Maxie Barb, MD  Multiple Vitamins-Minerals (MULTIVITAMIN WITH MINERALS) tablet Take 1 tablet by mouth daily. Chewables, Prescription   Yes [provider]  omeprazole (PRILOSEC) 20 MG capsule Take 20 mg by mouth daily as needed.  11/27/16  Yes [provider]  OVER THE COUNTER MEDICATION Apply 1 application topically as needed (eczema). [employer] Eczema Cream    Yes [provider]  oxyCODONE-acetaminophen (PERCOCET/ROXICET) 5-325 MG tablet Take 1 tablet by mouth every 4 (four) hours as needed for severe pain.   Yes [provider]  traZODone (DESYREL) 100 MG tablet Take 100 mg by mouth at bedtime.   Yes [provider]     Family History  Problem Relation Age of Onset  . Arthritis Mother   . Hypertension Mother  Social History   Social History  . Marital status: Married    Spouse name: N/A  . Number of children: 0  . Years of education: 7th   Occupational History  . Construction     Social History Main Topics  . Smoking status: Former Smoker    Packs/day: 1.00    Quit date: 11/27/2013  . Smokeless tobacco: Never Used  . Alcohol use No  . Drug use: No  . Sexual  activity: Not on file   Other Topics Concern  . Not on file   Social History Narrative   Drinks about 1 soda a day     Review of Systems  Constitutional: Negative for fatigue and fever.  Respiratory: Negative for cough and shortness of breath.   Cardiovascular: Negative for chest pain.  Gastrointestinal: Positive for abdominal pain and nausea.  Psychiatric/Behavioral: Negative for behavioral problems and confusion.    Vital Signs: There were no vitals taken for this visit.  Physical Exam  Constitutional: He is oriented to person, place, and time. He appears well-developed.  Cardiovascular: Normal rate, regular rhythm and normal heart sounds.   Pulmonary/Chest: Effort normal and breath sounds normal. No respiratory distress.  Abdominal: Soft.  Neurological: He is alert and oriented to person, place, and time.  Skin: Skin is warm and dry.  Psychiatric: He has a normal mood and affect. His behavior is normal. Judgment and thought content normal.  Nursing note and vitals reviewed.   Imaging: Dg Chest 1 View  Result Date: 01/23/2017 CLINICAL DATA:  Status post left-sided thoracentesis EXAM: CHEST 1 VIEW COMPARISON:  January 22, 2017 FINDINGS: No pneumothorax. There remains moderate at least partially loculated pleural effusion on the left. There is a small pleural effusion on the right. There is bibasilar atelectasis. Heart is borderline enlarged with pulmonary venous hypertension. There is no frank edema or consolidation. Central catheter tip is in the superior vena cava. No adenopathy evident. No bone lesions. IMPRESSION: No pneumothorax following thoracentesis. Moderate pleural effusion, at least partially loculated, on the left remains. Small right pleural effusion present. Bibasilar atelectasis. Underlying pulmonary vascular congestion. Electronically Signed   By: Bretta Bang III M.D.   On: 01/23/2017 12:33   Ct Chest Wo Contrast  Result Date: 01/30/2017 CLINICAL DATA:   Evaluate pleural effusion EXAM: CT CHEST WITHOUT CONTRAST TECHNIQUE: Multidetector CT imaging of the chest was performed following the standard protocol without IV contrast. COMPARISON:  01/24/2017 CT and 01/30/2017 CXR FINDINGS: Cardiovascular: PICC line tip terminates in the brachiocephalic confluence. Heart size is normal. No significant pericardial effusion. No aneurysm of the aorta. Mediastinum/Nodes: Midline trachea. No thyroid mass. No mediastinal adenopathy. Hilum is difficult to evaluate without IV contrast. Esophagus is within normal limits. Lungs/Pleura: Bilateral chest tubes are identified, one coiled along the base of the right lung and the second along the periphery of the left hemithorax. Small ex vacuo pneumothorax status post drainage of left pleural fluid is seen along the posterior aspect of the left lung along the major fissure with small amount of air noted overlying the left lung apex. Slight interval increase in right pleural effusion since prior despite drainage catheter measuring 3.5 cm in thickness versus approximately 2.5 cm in thickness previously. Upper Abdomen: A percutaneous pigtail catheter is again seen in the left upper quadrant of the abdomen adjacent to the bypassed stomach with further decrease in abscess collection. Second right upper quadrant pigtail drainage catheter overlies the right hepatic lobe with smaller volume of crescentic fluid outlining  the liver currently. Musculoskeletal: No acute nor suspicious osseous abnormality. IMPRESSION: 1. Bilateral chest tubes and upper abdominal drainage catheters in place as above. 2. There has been some interval increase in right-sided pleural effusion despite presence of a right basilar chest tube. Small pneumothorax ex vacuo status post drainage left pleural effusion with smaller volume of pleural fluid seen. 3. Decrease in right perihepatic fluid collection since prior as well as adjacent to the bypassed stomach. Electronically  Signed   By: Tollie Eth M.D.   On: 01/30/2017 20:54   Ct Chest Wo Contrast  Result Date: 01/24/2017 CLINICAL DATA:  Left pleural effusion status post thoracentesis EXAM: CT CHEST WITHOUT CONTRAST TECHNIQUE: Multidetector CT imaging of the chest was performed following the standard protocol without IV contrast. COMPARISON:  Radiograph 01/23/2017, CT abdomen pelvis 01/19/2017, 01/15/2017 FINDINGS: Cardiovascular: Limited evaluation without intravenous contrast. Non aneurysmal aorta. Right-sided central venous catheter tip is at the brachiocephalic confluence. Normal heart size. No significant pericardial effusion Mediastinum/Nodes: Midline trachea. No thyroid mass. No significantly enlarged mediastinal lymph nodes. Limited assessment of hilar adenopathy. Esophagus within normal limits. Lungs/Pleura: Partial lower lobe consolidations may reflect atelectasis or pneumonia. Moderate left pleural effusion with some loculation anteriorly. Small right-sided pleural effusion. Negative for a pneumothorax Upper Abdomen: Interval placement of pigtail drainage catheter adjacent to the bypassed stomach with decreased abscess collection since previous CT. Grossly stable perisplenic fluid collection measuring approximately 11.3 x 2.5 cm on axial images. Interval increase in a right perihepatic fluid collection, this measures 15 cm x 6.8 cm by 16.3 cm. Mass effect on the underlying liver. Musculoskeletal: No acute or suspicious bone lesion IMPRESSION: 1. Small right pleural effusion and moderate left pleural effusion with some loculation of the pleural fluid on the left. Negative for a pneumothorax. Partial consolidations in the lower lobes may reflect atelectasis or pneumonia 2. Interim placement of a pigtail drainage catheter adjacent to the gastric sutures/gastro jejunal anastomosis with resolution of previously noted gas and fluid collection 3. Interval increase in size of a right perihepatic fluid collection since the prior  CT, measuring at least 16.3 cm. 4. Grossly stable left perisplenic fluid collection Electronically Signed   By: Jasmine Pang M.D.   On: 01/24/2017 19:52   Dg Chest Left Decubitus  Result Date: 01/22/2017 CLINICAL DATA:  Shortness of breath.  Known left pleural effusion. EXAM: CHEST - LEFT DECUBITUS COMPARISON:  Portable chest x-ray of today's date. FINDINGS: There is a moderate size left pleural effusion. There is a free-flowing component of this E fusion. In addition, there is a small right pleural effusion which layers medially. IMPRESSION: Moderate-sized left pleural effusion with a free-flowing component. Small right pleural effusion with free-flowing component. Electronically Signed   By: David  [place] M.D.   On: 01/22/2017 11:19   Ct Abdomen Pelvis W Contrast  Result Date: 02/11/2017 CLINICAL DATA:  Status post percutaneous drainage catheter placement x 3 to treat intraabdominal abscesses after prior gastric bypass surgery. EXAM: CT ABDOMEN AND PELVIS WITH CONTRAST TECHNIQUE: Multidetector CT imaging of the abdomen and pelvis was performed using the standard protocol following bolus administration of intravenous contrast. CONTRAST:  125 mL Isovue-300 IV COMPARISON:  01/31/2017 as well as other prior CT studies. FINDINGS: Lower chest: Bibasilar atelectasis and small bilateral pleural effusions. Hepatobiliary: No focal liver abnormality is seen. No gallstones, gallbladder wall thickening, or biliary dilatation. Pancreas: Unremarkable. No pancreatic ductal dilatation or surrounding inflammatory changes. Spleen: Normal in size without focal abnormality. Adrenals/Urinary Tract: Adrenal glands are unremarkable. Kidneys  are normal, without renal calculi, focal lesion, or hydronephrosis. Bladder is unremarkable. Stomach/Bowel: No evidence of bowel obstruction or dilated bowel loops. No free air. Vascular/Lymphatic: No vascular abnormalities or enlarged lymph nodes. Reproductive: Prostate is unremarkable.  Other: Stable drain positioning lateral to the dome of the liver. There is a small amount of residual adjacent perihepatic fluid that continues inferiorly along the lateral margin of the liver and into the pericolic gutter. Overall amount of fluid tracking inferiorly has decreased since the prior study. Stable positioning of epigastric drain adjacent to the bypassed stomach. Residual abscess shows further decompression with no significant fluid remaining adjacent to the drainage catheter. Stable positioning of pelvic drainage catheter with no further abscess seen. The drainage catheter abuts small bowel loops. Perisplenic fluid remains present lateral to the spleen and measures approximately 4.4 x 8 cm. Overall fluid volume adjacent to the spleen is relatively stable. Musculoskeletal: No acute or significant osseous findings. IMPRESSION: 1. Small amount of perihepatic fluid remains present lateral to the liver and tracking down into the right pericolic gutter. Total amount of fluid appears decreased since the prior study. Stable drain positioning lateral to the dome of the liver. 2. Stable positioning of epigastric drain adjacent to the stomach without significant abscess present. 3. Stable positioning of pelvic drainage catheter with no further abscess seen in this region. The drain abuts small bowel loops. 4. Relatively stable amount of residual fluid lateral to the spleen. Electronically Signed   By: Irish Lack M.D.   On: 02/11/2017 15:10   Ct Abdomen Pelvis W Contrast  Result Date: 01/31/2017 CLINICAL DATA:  Abscess.  History of complicated gastric bypass. EXAM: CT ABDOMEN AND PELVIS WITH CONTRAST TECHNIQUE: Multidetector CT imaging of the abdomen and pelvis was performed using the standard protocol following bolus administration of intravenous contrast. CONTRAST:  ISOVUE-300 IOPAMIDOL (ISOVUE-300) INJECTION 61% COMPARISON:  01/27/2017 FINDINGS: Lower chest: There are small bilateral pleural  effusions with subtle pleural thickening. A left-sided chest tube has been removed since chest CT yesterday. The effusions are not progressed since prior. Multi segment lower lobe atelectasis. Small loculated pneumothorax on the left. Hepatobiliary: No primary abnormality noted. Peri hepatic collection describedbelow. Negative biliary tree. Pancreas: Unremarkable. Spleen: No acute finding.  No evidence of infarct. Adrenals/Urinary Tract: Negative adrenals. Patient has urolithiasis, but currently obscured by contrast excretion. No hydronephrosis. Small renal cysts. Unremarkable bladder. Stomach/Bowel: Status post gastric bypass. The collection interposed between the gastric remnant and pouch is resolved, with catheter in place. There is a right subphrenic collection extending along the right liver to the level of the paracolic gutter and pelvic inlet. This collection has catheters in the subphrenic and right lower quadrant spaces, both in continuity with the collection (septations could impede flow). The subphrenic collection is significantly decreased from prior, now 12 mm in thickness. Residual predominant pocket measures 16 cm in length by 4 cm in thickness in the paracolic gutter. Lateral left perisplenic collection is mildly increased from 01/19/2017, now 13 x 3 cm as compared to 13 x 2 cm previously. No convincing change from CT 01/27/2017. No new collection is seen. Oral contrast is distributed throughout the colon. None is seen within the collections. Moderate sigmoid diverticulosis. Negative for bowel obstruction. Vascular/Lymphatic: No acute vascular abnormality. Prominent atherosclerotic calcification for age. Reproductive:No pathologic findings. Other: No ascites or pneumoperitoneum. Musculoskeletal: No acute abnormalities. IMPRESSION: 1. Resolved collection between the gastric remnant and gastric pouch, with percutaneous catheter in place. 2. Apparently continuous collection extending from the right  subphrenic space to paracolic gutter and right lower quadrant. Subphrenic catheter in good position with significantly decreased collection compared to 01/19/2017. Residual subphrenic collection measures up to 1.5 cm in thickness. The right paracolic gutter collection measures 16 cm in length by 4 cm in maximal thickness. 3. Left peri splenic/subphrenic collection is 13 cm craniocaudal and 3 cm in thickness. Collection is mildly increased from 01/19/2017. 4. Small bilateral complex pleural effusion with multi segment atelectasis. Left-sided chest tube has been removed since CT yesterday. 5. No extravasation of oral contrast, which reached the colon. Electronically Signed   By: Marnee Spring M.D.   On: 01/31/2017 17:20   Ct Abdomen Pelvis W Contrast  Result Date: 01/19/2017 CLINICAL DATA:  Follow-up intestinal bypass and anastomosis status EXAM: CT ABDOMEN AND PELVIS WITH CONTRAST TECHNIQUE: Multidetector CT imaging of the abdomen and pelvis was performed using the standard protocol following bolus administration of intravenous contrast. CONTRAST:  100 mL Isovue-300 intravenous COMPARISON:  01/16/2017, 01/15/2017 FINDINGS: Lower chest: Moderate left-sided pleural effusion and small right pleural effusion, slightly increased compared to prior. Bilateral lower lobe consolidations may reflect atelectasis or pneumonia. Heart size is within normal limits. Hepatobiliary: Small hypodense collection along the left margin of the liver. Decreased rim enhancing sub capsular fluid around the liver. Residual lenticular shaped collection measuring 16 cm in AP by 3.3 cm transverse. No calcified gallstones. No biliary dilatation. Pancreas: Unremarkable. No pancreatic ductal dilatation or surrounding inflammatory changes. Spleen: Normal in size without focal abnormality. Decreased rim enhancing left perisplenic fluid collection, measuring 11.7 cm by 2.2 cm compared with 12.1 x 2.4 cm previously. Adrenals/Urinary Tract: Adrenal  glands are within normal limits. 3 mm stone in the upper pole of the left kidney. No hydronephrosis. The bladder is unremarkable small cyst lower pole left kidney. Stomach/Bowel: Patient is status post gastric bypass. Slight increased size of a gas and fluid collection, adjacent to the suture line, this measures 10.9 x 5.8 cm compared with 10.7 x 5 cm previously. It is now contiguous with a small gas and fluid collection between the gastric pouch and anterior aspect of the liver. Additional hypodense fluid collection measuring about 4.7 by 2.4 cm adjacent to the pyloric region of the excluded stomach. Previously this measured 2.5 x 4.3 cm. There is no evidence for a bowel obstruction. There is mild wall thickening of the hepatic flexure and right colon. Vascular/Lymphatic: Aortic atherosclerosis. No enlarged abdominal or pelvic lymph nodes. Reproductive: Prostate is unremarkable. Other: Increased fluid in the pelvis/presacral space. Interval placement of a right lower percutaneous drainage catheter with markedly decreased in size of the previously noted large peritoneal fluid collection. No discrete residual measurable collection at this time. Small scattered rim enhancing fluid collections are visualized in the right gutter, inferior margin of the liver, and in the left upper quadrant of the abdomen. Musculoskeletal: Degenerative changes. No acute or suspicious findings. IMPRESSION: 1. Interim placement of right lower quadrant percutaneous drainage catheter with significant decrease in size of previously noted large intraperitoneal fluid collection. Interval decrease in size of large right perihepatic and perisplenic rim enhancing fluid collections with residuals as described above. 2. Slight interval increase in size of a gas and fluid containing collection adjacent to the gastric sutures/gastro jejunal anastomosis. Gas and fluid collection now extend slightly more anterior, between the gastric pouch and the  liver. Given that the other collections have decreased in size, findings would be concerning for dehiscence/anastomotic leak. 3. Additional scattered small rim enhancing fluid collections within the abdomen  and pelvis. 4. Mild wall thickening of the ascending colon and hepatic flexure suggesting a colitis or reactive inflammation. Residual hazy edema and inflammation of the central and right upper quadrant mesentery. 5. Slight increased pleural effusions with continued bilateral lower lobe atelectasis or pneumonia 6. Nonobstructing stone in the left kidney Electronically Signed   By: Jasmine Pang M.D.   On: 01/19/2017 21:19   Dg Sinus/fist Tube Chk-non Gi  Result Date: 02/11/2017 INDICATION: Status post percutaneous drainage of 3 separate abscesses following gastric bypass surgery. EXAM: INJECTION OF TWO SEPARATE ABSCESS DRAINAGE CATHETERS UNDER FLUOROSCOPY MEDICATIONS: None ANESTHESIA/SEDATION: None CONTRAST:  13 mL Omnipaque 300 FLUOROSCOPY TIME:  1 minutes and 30 seconds.  507 mGy. COMPLICATIONS: None immediate. PROCEDURE: Contrast was injected through the epigastric percutaneous drain and fluoroscopic spot images saved. The catheter was then aspirated, cut and removed. A dressing was applied at the drain exit site. The pelvic drainage catheter was then injected with contrast material and fluoroscopic images saved. The catheter was flushed and reconnected to a new gravity drainage bag. FINDINGS: The epigastric drainage catheter injection demonstrates filling of a small cavity without evidence of fistula to the adjacent stomach. The drain was removed based on no clinical output of fluid over the last several days. The pelvic drainage catheter injection demonstrates immediate filling of a fistula that opacifies adjacent small bowel. The perihepatic drainage catheter was not injected and was left in place due to continued output of fluid. IMPRESSION: 1. Injection of the epigastric percutaneous drain  demonstrates filling of a small cavity without evidence of fistula to the adjacent stomach. This drain was removed. 2. Injection of the pelvic drainage catheter demonstrates a fistula to adjacent small bowel. This catheter was reconnected to gravity bag drainage. 3. The perihepatic catheter was left in place and connected to a new gravity drainage bag. Electronically Signed   By: Irish Lack M.D.   On: 02/11/2017 15:25   Dg Chest Port 1 View  Result Date: 02/01/2017 CLINICAL DATA:  Shortness of breath. EXAM: PORTABLE CHEST 1 VIEW COMPARISON:  01/30/2017 FINDINGS: Right arm PICC tip is in the subclavian vein at the SVC subclavian junction. Drainage catheter remains evident in the right upper quadrant. Left-sided drain is been removed. Bilateral effusions with atelectasis in both lower lungs. IMPRESSION: Bilateral effusions with atelectasis in both lower lungs. Electronically Signed   By: Paulina Fusi M.D.   On: 02/01/2017 09:43   Dg Chest Port 1 View  Result Date: 01/30/2017 CLINICAL DATA:  Pleural effusion.  Chest tubes. EXAM: PORTABLE CHEST 1 VIEW COMPARISON:  01/29/2017 . FINDINGS: Bilateral chest tubes in stable position. Right PICC line stable position. Cardiomegaly with diffuse bilateral pulmonary infiltrates/edema and bilateral pleural effusions. No pneumothorax. IMPRESSION: 1. Bilateral chest tubes and right PICC line in stable position. No pneumothorax. 2. Cardiomegaly with diffuse bilateral pulmonary infiltrates/ edema and bilateral pleural effusions. Findings consistent CHF. No interim change from prior exam. Electronically Signed   By: Maisie Fus  Register   On: 01/30/2017 07:15   Dg Chest Port 1 View  Result Date: 01/29/2017 CLINICAL DATA:  Pleural effusion.  Chest tube. EXAM: PORTABLE CHEST 1 VIEW COMPARISON:  01/28/2017 . FINDINGS: Bilateral chest tubes again noted . right PICC line stable position. Cardiomegaly with bilateral pulmonary infiltrates most consistent pulmonary edema. Small  bilateral pleural effusions again noted. IMPRESSION: 1. Bilateral chest tubes and right PICC line stable position. No pneumothorax. 2. Findings consistent congestive heart failure bilateral from interstitial edema bilateral pleural effusions again noted. No  interim change . Electronically Signed   By: Maisie Fus  Register   On: 01/29/2017 07:29   Dg Chest Port 1 View  Result Date: 01/28/2017 CLINICAL DATA:  Follow-up bilateral pleural effusions with chest tube treatment. The patient underwent recent bariatric surgery. EXAM: PORTABLE CHEST 1 VIEW COMPARISON:  Portable chest x-ray of January 27, 2017 FINDINGS: The lungs are well-expanded. Pleural fluid layer sposteriorly and laterally, bilaterally. The small caliber pigtail pleural drainage catheters are in stable position at the lung bases. The hemidiaphragms remain obscured. The cardiac silhouette is mildly enlarged. The central pulmonary vascularity is prominent. The interstitial markings remain increased bilaterally The PICC line tip projects over the proximal SVC. The bony thorax exhibits no acute abnormality. IMPRESSION: Stable appearance of the chest since yesterday's study. Persistent bilateral pleural effusions layering inferiorly and posteriorly. Mild cardiomegaly and central pulmonary vascular congestion with mild pulmonary interstitial edema. Electronically Signed   By: David  [place] M.D.   On: 01/28/2017 07:36   Dg Chest Port 1 View  Result Date: 01/27/2017 CLINICAL DATA:  Pleural effusions EXAM: PORTABLE CHEST 1 VIEW COMPARISON:  01/26/2017 FINDINGS: Pigtail catheters project over each lung base with hazy adjacent opacities presumably due to layering effusions and adjacent atelectasis. No pneumothorax is identified. Right-sided PICC line tip is seen in the mid SVC. Heart is borderline enlarged with mild vascular congestion. No acute nor suspicious osseous abnormalities. IMPRESSION: 1. New pigtail catheters are seen at each lung base superimposed on  presumed layering pleural effusions and atelectasis. 2. No pneumothorax. 3. Stable cardiomegaly. Electronically Signed   By: Tollie Eth M.D.   On: 01/27/2017 19:54   Dg Chest Port 1 View  Result Date: 01/26/2017 CLINICAL DATA:  Pleural effusion EXAM: PORTABLE CHEST 1 VIEW COMPARISON:  01/23/2017 FINDINGS: Stable right upper extremity PICC. Stable bilateral pleural effusions and bilateral predominately basilar airspace disease left greater than right. No pneumothorax. IMPRESSION: Stable bilateral pleural effusions and bilateral airspace disease. Electronically Signed   By: Jolaine Click M.D.   On: 01/26/2017 07:20   Dg Chest Port 1 View  Result Date: 01/21/2017 CLINICAL DATA:  Pleural effusion.  PICC line placement. EXAM: PORTABLE CHEST 1 VIEW COMPARISON:  Two-view chest x-ray 01/15/2017 FINDINGS: A right-sided PICC line is in place.  The tip is in the mid SVC. A left pleural effusion has increased. A small right pleural effusion is again noted. Bibasilar airspace disease likely reflects atelectasis. Low lung volumes exaggerate the heart size. Mild pulmonary vascular congestion has increased. IMPRESSION: 1. Increasing left pleural effusion. 2. Cardiomegaly and progressive pulmonary vascular congestion. 3. Interval placement of right-sided PICC line. The tip is in the mid SVC. 4. Bibasilar airspace disease likely reflects atelectasis. Electronically Signed   By: Marin Roberts M.D.   On: 01/21/2017 16:48   Dg Kayleen Memos W/water Sol Cm  Result Date: 01/23/2017 CLINICAL DATA:  Inpatient. 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 Status post right lower quadrant percutaneous drain placement 01/16/2017. Status post left upper quadrant percutaneous drain placement 01/21/2017. EXAM: WATER SOLUBLE UPPER GI SERIES TECHNIQUE: Single-column upper GI series was performed using water soluble contrast. CONTRAST:  75 cc Isovue-300 oral contrast.  COMPARISON:  01/19/2017 CT abdomen/pelvis. FLUOROSCOPY TIME:  Fluoroscopy Time:  3 minutes 42 seconds. Number of Acquired Spot Images: 15 FINDINGS: Percutaneous pigtail drainage catheter terminates in the medial left upper quadrant. Surgical sutures are also noted in the medial left upper quadrant. Esophagus is normal in distensibility with no gross esophageal  filling defects or strictures. Patient is status post gastric bypass surgery. The small gastric pouch appears intact with no filling defects or ulcers. The gastrojejunostomy appears intact with no evidence of anastomotic leak or significant narrowing at the anastomosis. Contrast passes into the normal caliber jejunal limb and proximal small bowel loops without delay. No evidence of oral contrast in the excluded distal stomach . IMPRESSION: No evidence of anastomotic leak or stricture at the gastrojejunal anastomosis. Intact gastric pouch without appreciable complication. Electronically Signed   By: Delbert Phenix M.D.   On: 01/23/2017 12:02   Ct Image Guided Drainage By Percutaneous Catheter  Result Date: 01/27/2017 INDICATION: 41 year old male with a history of gastric bypass surgery and multiple intra-abdominal fluid collections and exited infusion of the left chest. EXAM: CT-GUIDED DRAIN OF RIGHT PERI HEPATIC/SUBCAPSULAR FLUID. CT-GUIDED LEFT CHEST TUBE PLACEMENT MEDICATIONS: The patient is currently admitted to the hospital and receiving intravenous antibiotics. The antibiotics were administered within an appropriate time frame prior to the initiation of the procedure. ANESTHESIA/SEDATION: 3.0 mg IV Versed 200 mcg IV Fentanyl Moderate Sedation Time:  31 minutes The patient was continuously monitored during the procedure by the interventional radiology nurse under my direct supervision. COMPLICATIONS: None TECHNIQUE: Informed written consent was obtained from the patient after a thorough discussion of the procedural risks, benefits and alternatives. All  questions were addressed. Maximal Sterile Barrier Technique was utilized including caps, mask, sterile gowns, sterile gloves, sterile drape, hand hygiene and skin antiseptic. A timeout was performed prior to the initiation of the procedure. PROCEDURE: Patient is positioned supine position on the CT gantry table. Scout CT image of the upper abdomen and lower chest performed for planning purposes. The right upper abdomen was addressed. The right upper abdomen was prepped with chlorhexidine in a sterile fashion, and a sterile drape was applied covering the operative field. A sterile gown and sterile gloves were used for the procedure. Local anesthesia was provided with 1% Lidocaine. Once the patient is prepped and draped in the usual sterile fashion, the skin and subcutaneous tissues were generously infiltrated 1% lidocaine for local anesthesia. Using a trocar needle, the fluid at the right peripheral liver was approach. Once we confirmed the needle tip within the fluid, modified Seldinger technique was used to place a 12 [place] drain. Drain was sutured in position and attached to gravity drainage. The left chest was then addressed. The lower left chest was prepped with chlorhexidine in a sterile fashion common sterile drape was applied covering the operative field. Sterile gown and gloves were used. Local anesthesia provided with 1% lidocaine. Once the patient was prepped and draped in the usual sterile fashion, the skin and subcutaneous tissues were generously infiltrated 1% lidocaine for local anesthesia. Using CT guidance, trocar needle was advanced into the fluid of the left pleural space. Using modified Seldinger technique, a 10 French drain was placed. This was attached to water seal chamber. Culture from the abdomen and the chest were sent to the lab. Final CT image was acquired. Patient tolerated the procedure well and remained hemodynamically stable throughout. No complications were encountered and no  significant blood loss. FINDINGS: Initial CT image demonstrates perihepatic fluid collection of the right aspect of the liver, likely subcapsular given the configuration. Unchanged from prior. Drainage catheter terminates in the midline adjacent to the surgical changes of gastric bypass, similar prior. Small fluid adjacent to the spleen. Unchanged appearance of left pleural fluid. Status post drainage of right upper abdomen and left chest fluid, decreased size  of the fluid collection of the right abdomen, with unchanged size of fluid collection in the chest. Paragraphs sample was sent for me age fluid location. IMPRESSION: Status post CT-guided 12 French drain placement of the right upper abdomen, and CT-guided left thoracostomy tube with 10 French drain placed. Sample was sent to the lab for culture from each location. Signed, Yvone Neu. Loreta Ave, DO [facility] Electronically Signed   By: Gilmer Mor D.O.   On: 01/27/2017 15:32   Ct Image Guided Drainage By Percutaneous Catheter  Result Date: 01/27/2017 INDICATION: 41 year old male with a history of gastric bypass surgery and multiple intra-abdominal fluid collections and exited infusion of the left chest. EXAM: CT-GUIDED DRAIN OF RIGHT PERI HEPATIC/SUBCAPSULAR FLUID. CT-GUIDED LEFT CHEST TUBE PLACEMENT MEDICATIONS: The patient is currently admitted to the hospital and receiving intravenous antibiotics. The antibiotics were administered within an appropriate time frame prior to the initiation of the procedure. ANESTHESIA/SEDATION: 3.0 mg IV Versed 200 mcg IV Fentanyl Moderate Sedation Time:  31 minutes The patient was continuously monitored during the procedure by the interventional radiology nurse under my direct supervision. COMPLICATIONS: None TECHNIQUE: Informed written consent was obtained from the patient after a thorough discussion of the procedural risks, benefits and alternatives. All questions  were addressed. Maximal Sterile Barrier Technique was utilized including caps, mask, sterile gowns, sterile gloves, sterile drape, hand hygiene and skin antiseptic. A timeout was performed prior to the initiation of the procedure. PROCEDURE: Patient is positioned supine position on the CT gantry table. Scout CT image of the upper abdomen and lower chest performed for planning purposes. The right upper abdomen was addressed. The right upper abdomen was prepped with chlorhexidine in a sterile fashion, and a sterile drape was applied covering the operative field. A sterile gown and sterile gloves were used for the procedure. Local anesthesia was provided with 1% Lidocaine. Once the patient is prepped and draped in the usual sterile fashion, the skin and subcutaneous tissues were generously infiltrated 1% lidocaine for local anesthesia. Using a trocar needle, the fluid at the right peripheral liver was approach. Once we confirmed the needle tip within the fluid, modified Seldinger technique was used to place a 12 [place] drain. Drain was sutured in position and attached to gravity drainage. The left chest was then addressed. The lower left chest was prepped with chlorhexidine in a sterile fashion common sterile drape was applied covering the operative field. Sterile gown and gloves were used. Local anesthesia provided with 1% lidocaine. Once the patient was prepped and draped in the usual sterile fashion, the skin and subcutaneous tissues were generously infiltrated 1% lidocaine for local anesthesia. Using CT guidance, trocar needle was advanced into the fluid of the left pleural space. Using modified Seldinger technique, a 10 French drain was placed. This was attached to water seal chamber. Culture from the abdomen and the chest were sent to the lab. Final CT image was acquired. Patient tolerated the procedure well and remained hemodynamically stable throughout. No complications were encountered and no significant blood  loss. FINDINGS: Initial CT image demonstrates perihepatic fluid collection of the right aspect of the liver, likely subcapsular given the configuration. Unchanged from prior. Drainage catheter terminates in the midline adjacent to the surgical changes of gastric bypass, similar prior. Small fluid adjacent to the spleen. Unchanged appearance of left pleural fluid. Status post drainage of right upper abdomen and left chest fluid, decreased size of the fluid collection of the right abdomen, with unchanged size  of fluid collection in the chest. Paragraphs sample was sent for me age fluid location. IMPRESSION: Status post CT-guided 12 French drain placement of the right upper abdomen, and CT-guided left thoracostomy tube with 10 French drain placed. Sample was sent to the lab for culture from each location. Signed, Yvone Neu. Loreta Ave, DO [facility] Radiology Electronically Signed   By: Gilmer Mor D.O.   On: 01/27/2017 15:32   Ct Image Guided Drainage By Percutaneous Catheter  Result Date: 01/21/2017 INDICATION: History of reactive surgery performed at outside institution, post placement of CT-guided percutaneous drainage catheter within the right lower abdomen / pelvis on 01/16/2017 with subsequent abdominal CT performed 01/19/2017 demonstrating a residual perigastric abscess. Request made for placement of a additional percutaneous drainage catheter for infection source control purposes. EXAM: CT IMAGE GUIDED DRAINAGE BY PERCUTANEOUS CATHETER COMPARISON:  CT abdomen pelvis - 01/19/2017; 01/15/2017; CT-guided percutaneous drainage catheter placement - 01/16/2017 MEDICATIONS: The patient is currently admitted to the hospital and receiving intravenous antibiotics. The antibiotics were administered within an appropriate time frame prior to the initiation of the procedure. ANESTHESIA/SEDATION: Moderate (conscious) sedation was employed during this procedure. A total of  Versed 2 mg and Fentanyl 100 mcg was administered intravenously. Moderate Sedation Time: 15 minutes. The patient's level of consciousness and vital signs were monitored continuously by radiology nursing throughout the procedure under my direct supervision. CONTRAST:  None COMPLICATIONS: None immediate. PROCEDURE: Informed written consent was obtained from the patient after a discussion of the risks, benefits and alternatives to treatment. The patient was placed supine on the CT gantry and a pre procedural CT was performed re-demonstrating the known abscess/fluid collection within the upper abdomen adjacent to the stomach and gastric anastomosis with dominant component measuring approximately 3.7 x 10.6 cm (image 24, series 3). The procedure was planned. A timeout was performed prior to the initiation of the procedure. The skin overlying the midline of the upper abdomen was prepped and draped in the usual sterile fashion. The overlying soft tissues were anesthetized with 1% lidocaine with epinephrine. Appropriate trajectory was planned with the use of a 22 gauge spinal needle. An 18 gauge trocar needle was advanced into the abscess/fluid collection and a short Amplatz super stiff wire was coiled within the collection. Appropriate positioning was confirmed with a limited CT scan. The tract was serially dilated allowing placement of a 10 [place] all-purpose drainage catheter. Appropriate positioning was confirmed with a limited postprocedural CT scan. Approximately 100 cc of purulent fluid was aspirated. The tube was connected to a drainage bag and sutured in place. A dressing was placed. The patient tolerated the procedure well without immediate post procedural complication. IMPRESSION: Successful CT guided placement of a 10 French all purpose drain catheter into the residual perigastric abscess with aspiration of 100 cc of purulent fluid. Samples were sent to the laboratory as requested by the ordering clinical team.  Electronically Signed   By: Simonne Come M.D.   On: 01/21/2017 10:11   Ir Thoracentesis Asp Pleural Space W/img Guide  Result Date: 01/23/2017 INDICATION: Status post gastric bypass surgery. Now with multiple intra-abdominal fluid collection that have been drained. Left pleural effusion noted on recent imaging with some dyspnea. Request is made for diagnostic and therapeutic thoracentesis. EXAM: ULTRASOUND GUIDED LEFT THORACENTESIS MEDICATIONS: 1% xylocaine COMPLICATIONS: None immediate. PROCEDURE: An ultrasound guided thoracentesis was thoroughly discussed with the patient and questions answered. The benefits, risks, alternatives and complications were also discussed. The patient understands and wishes to  proceed with the procedure. Written consent was obtained. Ultrasound was performed to localize and mark an adequate pocket of fluid in the left chest. The area was then prepped and draped in the normal sterile fashion. 1% Lidocaine was used for local anesthesia. Under ultrasound guidance a Safe-T-Centesis catheter was introduced. Thoracentesis was performed. The catheter was removed and a dressing applied. FINDINGS: A total of approximately 25 cc of serosanguineous fluid was removed. Samples were sent to the laboratory as requested by the clinical team. The fluid appeared to have some complexity to it. The right side was imaged as well and no effusion was noted. IMPRESSION: Successful ultrasound guided left thoracentesis yielding 25 cc of pleural fluid. Read by: Barnetta Chapel, PA-C Electronically Signed   By: Malachy Moan M.D.   On: 01/23/2017 13:24    Labs:  CBC:  Recent Labs  01/26/17 0433 01/27/17 0551 01/31/17 0449 02/01/17 0459  WBC 5.5 5.4 4.6 3.9*  HGB 8.2* 8.2* 8.2* 8.4*  HCT 27.9* 27.3* 27.3* 27.3*  PLT 432* 444* 392 390    COAGS:  Recent Labs  01/15/17 1420 01/20/17 1744 01/23/17 0723  INR 1.07 1.19 1.12    BMP:  Recent Labs  01/29/17 0405 01/31/17 0449  02/01/17 0459 02/02/17 0429  NA 133* 134* 135 137  K 4.0 4.0 3.8 3.8  CL 98* 100* 101 102  CO2 GLUCOSE 131* 104* 98 104*  BUN <5*  CALCIUM 8.2* 8.1* 8.1* 8.0*  CREATININE 0.46* 0.59* 0.54* 0.52*  GFRNONAA >60 >60 >60 >60  GFRAA >60 >60 >60 >60    LIVER FUNCTION TESTS:  Recent Labs  01/22/17 0553 01/23/17 1248 01/26/17 0433 01/27/17 1937 01/29/17 0405  BILITOT 0.6 0.8 0.4  --  0.2*  AST --  17  ALT 18 19 15*  --  10*  ALKPHOS 139* 175* 116  --  83  PROT 5.7* 6.7 6.4*  --  6.0*  ALBUMIN 1.6* 1.9* 1.9* 1.7* 1.8*    TUMOR MARKERS: No results for input(s): AFPTM, CEA, CA199, CHROMGRNA in the last 8760 hours.  Assessment and Plan: Abdominal absceses Patient presents to radiology department today for perihepatic drain removal and RLQ drain exchange under sedation.  He has been NPO.  He does not take blood thinners.  He understands the goals of the procedures today.  Risks and benefits discussed with the patient including bleeding, infection, damage to adjacent structures, bowel perforation/fistula connection, and sepsis. All of the patient's questions were answered, patient is agreeable to proceed. Consent signed and in chart.  Thank you for this interesting consult.  I greatly enjoyed meeting Albert White and look forward to participating in their care.  A copy of this report was sent to the requesting provider on this date.  Electronically Signed: Hoyt Koch, PA 02/18/2017, 8:01 AM   I spent a total of    10 Minutes in face to face in clinical consultation, greater than 50% of which was counseling/coordinating care for abdominal abscesses

## 2017-02-18 NOTE — Procedures (Signed)
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 Perihepatic drain removed.  Jodi Marble. Fredia Sorrow, M.D Pager:  [phone]

## 2017-02-18 NOTE — Sedation Documentation (Signed)
Pt discharged home with wife. Instructions given. Awake and alert. In no distress.

## 2017-02-18 NOTE — Discharge Instructions (Addendum)
Percutaneous Abscess Drain, Care After °This sheet gives you information about how to care for yourself after your procedure. Your health care provider may also give you more specific instructions. If you have problems or questions, contact your health care provider. °What can I expect after the procedure? °After your procedure, it is common to have: °· A small amount of bruising and discomfort in the area where the drainage tube (catheter) was placed. °· Sleepiness and fatigue. This should go away after the medicines you were given have worn off. ° °Follow these instructions at home: °Incision care °· Follow instructions from your health care provider about how to take care of your incision. Make sure you: °? Wash your hands with soap and water before you change your bandage (dressing). If soap and water are not available, use hand sanitizer. °? Change your dressing as told by your health care provider. °? Leave stitches (sutures), skin glue, or adhesive strips in place. These skin closures may need to stay in place for 2 weeks or longer. If adhesive strip edges start to loosen and curl up, you may trim the loose edges. Do not remove adhesive strips completely unless your health care provider tells you to do that. °· Check your incision area every day for signs of infection. Check for: °? More redness, swelling, or pain. °? More fluid or blood. °? Warmth. °? Pus or a bad smell. °? Fluid leaking from around your catheter (instead of fluid draining through your catheter). °Catheter care °· Follow instructions from your health care provider about emptying and cleaning your catheter and collection bag. You may need to clean the catheter every day so it does not clog. °· If directed, write down the following information every time you empty your bag: °? The date and time. °? The amount of drainage. °General instructions °· Rest at home for 1-2 days after your procedure. Return to your normal activities as told by your  health care provider. °· Do not take baths, swim, or use a hot tub for 24 hours after your procedure, or until your health care provider says that this is okay. °· Take over-the-counter and prescription medicines only as told by your health care provider. °· Keep all follow-up visits as told by your health care provider. This is important. °Contact a health care provider if: °· You have less than 10 mL of drainage a day for 2-3 days in a row, or as directed by your health care provider. °· You have more redness, swelling, or pain around your incision area. °· You have more fluid or blood coming from your incision area. °· Your incision area feels warm to the touch. °· You have pus or a bad smell coming from your incision area. °· You have fluid leaking from around your catheter (instead of through your catheter). °· You have a fever or chills. °· You have pain that does not get better with medicine. °Get help right away if: °· Your catheter comes out. °· You suddenly stop having drainage from your catheter. °· You suddenly have blood in the fluid that is draining from your catheter. °· You become dizzy or you faint. °· You develop a rash. °· You have nausea or vomiting. °· You have difficulty breathing or you feel short of breath. °· You develop chest pain. °· You have problems with your speech or vision. °· You have trouble balancing or moving your arms or legs. °Summary °· It is common to have a small   amount of bruising and discomfort in the area where the drainage tube (catheter) was placed. °· You may be directed to record the amount of drainage from the bag every time you empty it. °· Follow instructions from your health care provider about emptying and cleaning your catheter and collection bag. °This information is not intended to replace advice given to you by your health care provider. Make sure you discuss any questions you have with your health care provider. °Document Released: 09/19/2013 Document  Revised: 03/27/2016 Document Reviewed: 03/27/2016 °Elsevier Interactive Patient Education © 2017 [employer] °Moderate Conscious Sedation, Adult, Care After °These instructions provide you with information about caring for yourself after your procedure. Your health care provider may also give you more specific instructions. Your treatment has been planned according to current medical practices, but problems sometimes occur. Call your health care provider if you have any problems or questions after your procedure. °What can I expect after the procedure? °After your procedure, it is common: °· To feel sleepy for several hours. °· To feel clumsy and have poor balance for several hours. °· To have poor judgment for several hours. °· To vomit if you eat too soon. ° °Follow these instructions at home: °For at least 24 hours after the procedure: ° °· Do not: °? Participate in activities where you could fall or become injured. °? Drive. °? Use heavy machinery. °? Drink alcohol. °? Take sleeping pills or medicines that cause drowsiness. °? Make important decisions or sign legal documents. °? Take care of children on your own. °· Rest. °Eating and drinking °· Follow the diet recommended by your health care provider. °· If you vomit: °? Drink water, juice, or soup when you can drink without vomiting. °? Make sure you have little or no nausea before eating solid foods. °General instructions °· Have a responsible adult stay with you until you are awake and alert. °· Take over-the-counter and prescription medicines only as told by your health care provider. °· If you smoke, do not smoke without supervision. °· Keep all follow-up visits as told by your health care provider. This is important. °Contact a health care provider if: °· You keep feeling nauseous or you keep vomiting. °· You feel light-headed. °· You develop a rash. °· You have a fever. °Get help right away if: °· You have trouble breathing. °This information is not  intended to replace advice given to you by your health care provider. Make sure you discuss any questions you have with your health care provider. °Document Released: 02/23/2013 Document Revised: 10/08/2015 Document Reviewed: 08/25/2015 °Elsevier Interactive Patient Education © 2018 Elsevier Inc. ° °

## 2017-02-19 DIAGNOSIS — Z4803 Encounter for change or removal of drains: Secondary | ICD-10-CM | POA: Diagnosis not present

## 2017-02-19 DIAGNOSIS — Z9884 Bariatric surgery status: Secondary | ICD-10-CM | POA: Diagnosis not present

## 2017-02-20 DIAGNOSIS — E119 Type 2 diabetes mellitus without complications: Secondary | ICD-10-CM | POA: Diagnosis not present

## 2017-02-20 DIAGNOSIS — J90 Pleural effusion, not elsewhere classified: Secondary | ICD-10-CM | POA: Diagnosis not present

## 2017-02-20 DIAGNOSIS — K6811 Postprocedural retroperitoneal abscess: Secondary | ICD-10-CM | POA: Diagnosis not present

## 2017-02-25 ENCOUNTER — Encounter: Payer: Self-pay | Admitting: Registered"

## 2017-02-25 ENCOUNTER — Encounter: Payer: 59 | Attending: General Surgery | Admitting: Registered"

## 2017-02-25 DIAGNOSIS — Z9884 Bariatric surgery status: Secondary | ICD-10-CM | POA: Diagnosis not present

## 2017-02-25 DIAGNOSIS — E669 Obesity, unspecified: Secondary | ICD-10-CM | POA: Insufficient documentation

## 2017-02-25 DIAGNOSIS — K912 Postsurgical malabsorption, not elsewhere classified: Secondary | ICD-10-CM | POA: Diagnosis not present

## 2017-02-25 DIAGNOSIS — E119 Type 2 diabetes mellitus without complications: Secondary | ICD-10-CM

## 2017-02-25 NOTE — Patient Instructions (Addendum)
-   Try lactose-free 2% milk.   - Track protein intake using Baritastic App. Aim for 80g protein daily.   - Try Gwend[employer] dressings and Skinny Girl dressings.

## 2017-02-25 NOTE — Progress Notes (Signed)
Follow-up visit:  10 Weeks Post-Operative RYGB Surgery  Medical Nutrition Therapy:  Appt start time: 2:00 end time:  2:50  Primary concerns today: Post-operative Bariatric Surgery Nutrition Management.  Non scale victories: none stated  Surgery date: 12/08/2016 with [facility] Surgery type: RYGB Start weight at [facility]: 290.1 lbs Weight today: 290.1 lbs Weight change: N/A Total weight lost: N/A Weight loss goal: none stated   TANITA  BODY COMP RESULTS  02/25/2017   BMI (kg/m^2) N/A   Fat Mass (lbs)    Fat Free Mass (lbs)    Total Body Water (lbs)     Pt is establishing care with [place] post-op from [facility]. Pt arrives with wife stating he needs post-op meal plan. Pt had RYGB with [facility] 2-3 months ago, on 7/23. Pt states he had leaks resulting in 10 liters of fluid being drained, along with multiple hospital stays. Pt currently has one drain and will possibly have it taken out next week. Pt states he tolerates most food well and things are improving for him. Pt states he is not able to handle drinking milk; it makes him feel sick. Pt states he is able to tolerate vegetables well. Pt states he takes over the counter adult multivitamin and calcium 2-3 supplements when he can remember.    Preferred Learning Style:   No preference indicated   Learning Readiness:   Ready  Change in progress  24-hr recall:  B ( AM): protein shake (30g) Snk ( AM): pickles  L ( PM): tomato soup Snk ( PM): cheerios or greek yogurt (12g) D ( PM): 3 chicken tenders (26g) Snk ( PM): none Beverages: 2% milk, water with flavor packs, Nature's Twist  Fluid intake: water, milk; 64+ oz Estimated total protein intake: ~68g  Medications: See list Supplementation: Adult MVI + 2-3 Calcium supplements  Recent physical activity:  None stated  Progress Towards Goal(s):  In progress.  Handouts given during visit include:  Phase 3B: High protein + NS vegetables   Nutritional Diagnosis:  NI-5.7.1 Inadequate  protein intake As related to bariatric post-op surgery recommendations.  As evidenced by pt report of less than 80 grams of protein daily.    Intervention:  Nutrition education and counseling. RD educated pt and his wife on the importance of meeting protein needs, how to track, and different sources of protein. Pt was also educated on appropriate bariatric-specific multivitamins and calcium supplements, including a daily regimen of taking them.   Goals: - Try lactose-free 2% milk.  - Track protein intake using Baritastic App. Aim for 80g protein daily.  - Try G[employer] dressings and Skinny Girl dressings.   Teaching Method Utilized:  Visual Auditory Hands on  Barriers to learning/adherence to lifestyle change: none  Demonstrated degree of understanding via:  Teach Back   Monitoring/Evaluation:  Dietary intake, exercise, lap band fills, and body weight. Follow up in 1 months for 14 week post-op visit.

## 2017-03-02 ENCOUNTER — Encounter: Payer: Self-pay | Admitting: Interventional Radiology

## 2017-03-02 DIAGNOSIS — G4733 Obstructive sleep apnea (adult) (pediatric): Secondary | ICD-10-CM | POA: Diagnosis not present

## 2017-03-03 ENCOUNTER — Ambulatory Visit
Admission: RE | Admit: 2017-03-03 | Discharge: 2017-03-03 | Disposition: A | Discharge status: Home / Self Care | Payer: 59 | Source: Ambulatory Visit | Attending: Interventional Radiology | Admitting: Interventional Radiology

## 2017-03-03 DIAGNOSIS — T8143XA Infection following a procedure, organ and space surgical site, initial encounter: Secondary | ICD-10-CM

## 2017-03-03 DIAGNOSIS — T8149XA Infection following a procedure, other surgical site, initial encounter: Principal | ICD-10-CM

## 2017-03-03 DIAGNOSIS — K651 Peritoneal abscess: Secondary | ICD-10-CM | POA: Diagnosis not present

## 2017-03-03 DIAGNOSIS — Z4659 Encounter for fitting and adjustment of other gastrointestinal appliance and device: Secondary | ICD-10-CM | POA: Diagnosis not present

## 2017-03-03 HISTORY — PX: IR RADIOLOGIST EVAL & MGMT: IMG5224

## 2017-03-03 NOTE — Progress Notes (Signed)
Patient ID: DRAGON THRUSH, male   DOB: 16-Aug-1975, 41 y.o.   MRN: [id number]       Chief Complaint:  Abdominal abscess, postop  Referring Physician(s): Yamagata,Glenn  History of Present Illness: Albert White is a 41 y.o. male status post gastric bypass complicated by abdominal abscesses. He has one residual pelvic abscess drain which had a small fistula to adjacent small bowel 2 weeks ago. This catheter was exchanged and retracted away from the fistula. Catheter has remained to gravity drainage without flushing. He reports no output. No significant pain, weight loss, fatigue or change in appetite. Stable diet. No interval fevers. Overall he is doing very well. He returns today for drain evaluation and fluoroscopic injection.  Past Medical History:  Diagnosis Date  . Abscess   . Diabetes mellitus without complication (HCC)   . Hidradenitis suppurativa   . Hyperlipemia   . Morbid obesity (HCC)   . Vertigo     Past Surgical History:  Procedure Laterality Date  . INCISION AND DRAINAGE ABSCESS Right 12/15/2013   Procedure: INCISION AND DRAINAGE Right Posterior neck ABSCESS;  Surgeon: Cherylynn Ridges, MD;  Location: [facility] OR;  Service: General;  Laterality: Right;  . INCISION AND DRAINAGE DEEP NECK ABSCESS     Hidradenitis - 3-4 times at [facility] and [place]  . IR CATHETER TUBE CHANGE  02/18/2017  . IR RADIOLOGIST EVAL & MGMT  02/11/2017  . IR THORACENTESIS ASP PLEURAL SPACE W/IMG GUIDE  01/23/2017    Allergies: Patient has no known allergies.  Medications: Prior to Admission medications   Medication Sig Start Date End Date Taking? Authorizing Provider  acetaminophen (TYLENOL) 325 MG tablet Take 2 tablets (650 mg total) by mouth every 8 (eight) hours. 02/27/16   Mesner, Barbara Cower, MD  atorvastatin (LIPITOR) 10 MG tablet Take 10 mg by mouth every evening.  09/18/15   [provider]  calcium carbonate (TUMS - DOSED IN MG ELEMENTAL CALCIUM) 500 MG chewable tablet Chew 500 mg by mouth as  needed for heartburn.    [provider]  levofloxacin (LEVAQUIN) 25 MG/ML solution Take by mouth daily.    [provider]  metroNIDAZOLE (FLAGYL) 250 MG tablet Take 250 mg by mouth 3 (three) times daily.    [provider]  Multiple Vitamins-Minerals (MULTIVITAMIN WITH MINERALS) tablet Take 1 tablet by mouth daily. Chewables, Prescription    [provider]  omeprazole (PRILOSEC) 20 MG capsule Take 20 mg by mouth daily as needed.  11/27/16   [provider]  OVER THE COUNTER MEDICATION Apply 1 application topically as needed (eczema). [employer] Eczema Cream     [provider]  oxyCODONE-acetaminophen (PERCOCET/ROXICET) 5-325 MG tablet Take 1 tablet by mouth every 4 (four) hours as needed for severe pain.    [provider]  traZODone (DESYREL) 100 MG tablet Take 100 mg by mouth at bedtime.    [provider]     Family History  Problem Relation Age of Onset  . Arthritis Mother   . Hypertension Mother   . Cancer Other   . Heart disease Other     Social History   Social History  . Marital status: Married    Spouse name: N/A  . Number of children: 0  . Years of education: 7th   Occupational History  . Construction     Social History Main Topics  . Smoking status: Former Smoker    Packs/day: 1.00    Quit date: 11/27/2013  . Smokeless  tobacco: Never Used  . Alcohol use No  . Drug use: No  . Sexual activity: Not on file   Other Topics Concern  . Not on file   Social History Narrative   Drinks about 1 soda a day       Review of Systems: A 12 point ROS discussed and pertinent positives are indicated in the HPI above.  All other systems are negative.  Review of Systems  Vital Signs: BP 120/79   Pulse 79   SpO2 99%   Physical Exam  Constitutional: He appears well-developed and well-nourished. No distress.  Abdominal: Soft. Bowel sounds are normal. He exhibits no distension. There is no tenderness.    Right lower quadrant drain catheter site is clean, dry and intact. No signs of infection.  Skin: He is not diaphoretic.     Imaging: Ct Abdomen Pelvis W Contrast  Result Date: 02/11/2017 CLINICAL DATA:  Status post percutaneous drainage catheter placement x 3 to treat intraabdominal abscesses after prior gastric bypass surgery. EXAM: CT ABDOMEN AND PELVIS WITH CONTRAST TECHNIQUE: Multidetector CT imaging of the abdomen and pelvis was performed using the standard protocol following bolus administration of intravenous contrast. CONTRAST:  125 mL Isovue-300 IV COMPARISON:  01/31/2017 as well as other prior CT studies. FINDINGS: Lower chest: Bibasilar atelectasis and small bilateral pleural effusions. Hepatobiliary: No focal liver abnormality is seen. No gallstones, gallbladder wall thickening, or biliary dilatation. Pancreas: Unremarkable. No pancreatic ductal dilatation or surrounding inflammatory changes. Spleen: Normal in size without focal abnormality. Adrenals/Urinary Tract: Adrenal glands are unremarkable. Kidneys are normal, without renal calculi, focal lesion, or hydronephrosis. Bladder is unremarkable. Stomach/Bowel: No evidence of bowel obstruction or dilated bowel loops. No free air. Vascular/Lymphatic: No vascular abnormalities or enlarged lymph nodes. Reproductive: Prostate is unremarkable. Other: Stable drain positioning lateral to the dome of the liver. There is a small amount of residual adjacent perihepatic fluid that continues inferiorly along the lateral margin of the liver and into the pericolic gutter. Overall amount of fluid tracking inferiorly has decreased since the prior study. Stable positioning of epigastric drain adjacent to the bypassed stomach. Residual abscess shows further decompression with no significant fluid remaining adjacent to the drainage catheter. Stable positioning of pelvic drainage catheter with no further abscess seen. The drainage catheter abuts small bowel loops.  Perisplenic fluid remains present lateral to the spleen and measures approximately 4.4 x 8 cm. Overall fluid volume adjacent to the spleen is relatively stable. Musculoskeletal: No acute or significant osseous findings. IMPRESSION: 1. Small amount of perihepatic fluid remains present lateral to the liver and tracking down into the right pericolic gutter. Total amount of fluid appears decreased since the prior study. Stable drain positioning lateral to the dome of the liver. 2. Stable positioning of epigastric drain adjacent to the stomach without significant abscess present. 3. Stable positioning of pelvic drainage catheter with no further abscess seen in this region. The drain abuts small bowel loops. 4. Relatively stable amount of residual fluid lateral to the spleen. Electronically Signed   By: Irish Lack M.D.   On: 02/11/2017 15:10   Ir Catheter Tube Change  Result Date: 02/18/2017 INDICATION: Status post complications after prior gastric bypass surgery with percutaneous drainage of multiple postoperative abscesses. Currently a perihepatic drainage catheter is in place with no significant output. A pelvic drainage catheter is also in place with previously demonstrated fistula to adjacent small bowel with injection of the drain on 02/11/2017. Plan now is for exchange of this drain into  a position short of the fistula to try to get the fistula to close. EXAM: IR CATHETER TUBE CHANGE MEDICATIONS: No additional medications other than sedation medication given below. ANESTHESIA/SEDATION: Fentanyl 100 mcg IV; Versed 3.0 mg IV Moderate Sedation Time:  11 minutes. The patient was continuously monitored during the procedure by the interventional radiology nurse under my direct supervision. COMPLICATIONS: None . PROCEDURE: Informed written consent was obtained from the patient after a thorough discussion of the procedural risks, benefits and alternatives. All questions were addressed. Maximal Sterile Barrier  Technique was utilized including caps, mask, sterile gowns, sterile gloves, sterile drape, hand hygiene and skin antiseptic. A timeout was performed prior to the initiation of the procedure. The pre-existing 68 French percutaneous drain in the pelvis was injected with contrast under fluoroscopy. The catheter was then cut and removed over a guidewire. A new 14 French drain was then advanced under fluoroscopy and position. The catheter was injected with contrast material and a fluoroscopic spot image saved. The catheter was then gently flushed with saline and connected to a gravity drainage bag. It was secured at the skin with a StatLock device. The perihepatic drainage catheter was aspirated. It was then cut and removed. A dressing was applied over the catheter exit site. FINDINGS: There is a persistent fistula to the small bowel with injection of the 14 French pelvic drainage catheter. A new catheter was exchanged over a wire and positioned just short of the fistula. Contrast injection after drain exchange continues to demonstrate filling of the prior abscess tract and fistula. IMPRESSION: 1. Exchange of pelvic drainage catheter after demonstration of persistent fistula to small bowel. A new 14 French drain was advanced short of the fistula. This will be left to gravity bag drainage and will not be flushed. Contrast injection of this drain will be performed in 2 weeks under fluoroscopy to re-assess fistula patency. 2. Removal of perihepatic abscess drainage catheter due to no residual drain output. Electronically Signed   By: Irish Lack M.D.   On: 02/18/2017 09:43   Dg Sinus/fist Tube Chk-non Gi  Result Date: 03/03/2017 CLINICAL DATA:  Postop abscess with fistulous small bowel. Outpatient follow-up. EXAM: ABSCESS INJECTION CONTRAST:  3 cc Omnipaque 300 FLUOROSCOPY TIME:  Fluoroscopy Time:  24 Radiation Exposure Index (if provided by the fluoroscopic device): 59 Number of Acquired Spot Images: 2 COMPARISON:   02/18/2017 FINDINGS: Injection of the right lower quadrant abscess drain performed under fluoroscopy. Images obtained. Distal aspect of the catheter has retracted further just deep to the abdominal wall. There is leakage at the skin site. No visualized fistula. Therefore the catheter will be removed. IMPRESSION: Retracted catheter just beneath the skin site. No visualized fistula. There has been no drain output since catheter exchange 2 weeks ago. Therefore the catheter was removed. Electronically Signed   By: Judie Petit.  Clorine Swing M.D.   On: 03/03/2017 11:39   Dg Sinus/fist Tube Chk-non Gi  Result Date: 02/11/2017 INDICATION: Status post percutaneous drainage of 3 separate abscesses following gastric bypass surgery. EXAM: INJECTION OF TWO SEPARATE ABSCESS DRAINAGE CATHETERS UNDER FLUOROSCOPY MEDICATIONS: None ANESTHESIA/SEDATION: None CONTRAST:  13 mL Omnipaque 300 FLUOROSCOPY TIME:  1 minutes and 30 seconds.  507 mGy. COMPLICATIONS: None immediate. PROCEDURE: Contrast was injected through the epigastric percutaneous drain and fluoroscopic spot images saved. The catheter was then aspirated, cut and removed. A dressing was applied at the drain exit site. The pelvic drainage catheter was then injected with contrast material and fluoroscopic images saved. The catheter was flushed  and reconnected to a new gravity drainage bag. FINDINGS: The epigastric drainage catheter injection demonstrates filling of a small cavity without evidence of fistula to the adjacent stomach. The drain was removed based on no clinical output of fluid over the last several days. The pelvic drainage catheter injection demonstrates immediate filling of a fistula that opacifies adjacent small bowel. The perihepatic drainage catheter was not injected and was left in place due to continued output of fluid. IMPRESSION: 1. Injection of the epigastric percutaneous drain demonstrates filling of a small cavity without evidence of fistula to the adjacent  stomach. This drain was removed. 2. Injection of the pelvic drainage catheter demonstrates a fistula to adjacent small bowel. This catheter was reconnected to gravity bag drainage. 3. The perihepatic catheter was left in place and connected to a new gravity drainage bag. Electronically Signed   By: Irish Lack M.D.   On: 02/11/2017 15:25   Ir Radiologist Eval & Mgmt  Result Date: 03/02/2017 Please refer to notes tab for details about interventional procedure. (Op Note)   Labs:  CBC:  Recent Labs  01/26/17 0433 01/27/17 0551 01/31/17 0449 02/01/17 0459  WBC 5.5 5.4 4.6 3.9*  HGB 8.2* 8.2* 8.2* 8.4*  HCT 27.9* 27.3* 27.3* 27.3*  PLT 432* 444* 392 390    COAGS:  Recent Labs  01/15/17 1420 01/20/17 1744 01/23/17 0723  INR 1.07 1.19 1.12    BMP:  Recent Labs  01/29/17 0405 01/31/17 0449 02/01/17 0459 02/02/17 0429  NA 133* 134* 135 137  K 4.0 4.0 3.8 3.8  CL 98* 100* 101 102  CO2 GLUCOSE 131* 104* 98 104*  BUN <5*  CALCIUM 8.2* 8.1* 8.1* 8.0*  CREATININE 0.46* 0.59* 0.54* 0.52*  GFRNONAA >60 >60 >60 >60  GFRAA >60 >60 >60 >60    LIVER FUNCTION TESTS:  Recent Labs  01/22/17 0553 01/23/17 1248 01/26/17 0433 01/27/17 1937 01/29/17 0405  BILITOT 0.6 0.8 0.4  --  0.2*  AST --  17  ALT 18 19 15*  --  10*  ALKPHOS 139* 175* 116  --  83  PROT 5.7* 6.7 6.4*  --  6.0*  ALBUMIN 1.6* 1.9* 1.9* 1.7* 1.8*      Assessment and Plan:  Fluoroscopic drain injection today demonstrates the retracted catheter is now just deep to the abdominal wall and skin. There is leakage at the catheter site. No visualized fistula to small bowel. Therefore the drain catheter can be removed today. He remains asymptomatic. No significant abdominal pain, pelvic pain, change in bowel habits, fever, or change in appetite.     Electronically Signed: Berdine Dance 03/03/2017, 11:49 AM   I spent a total of    15 Minutes in face to face in clinical  consultation, greater than 50% of which was counseling/coordinating care for postop abscess

## 2017-03-04 ENCOUNTER — Other Ambulatory Visit: Payer: 59

## 2017-03-04 DIAGNOSIS — R1084 Generalized abdominal pain: Secondary | ICD-10-CM | POA: Diagnosis not present

## 2017-03-04 DIAGNOSIS — D6489 Other specified anemias: Secondary | ICD-10-CM | POA: Diagnosis not present

## 2017-03-18 ENCOUNTER — Encounter: Payer: Self-pay | Admitting: Interventional Radiology

## 2017-03-25 ENCOUNTER — Encounter: Payer: Self-pay | Admitting: Registered"

## 2017-03-25 ENCOUNTER — Encounter: Payer: 59 | Attending: General Surgery | Admitting: Registered"

## 2017-03-25 DIAGNOSIS — E669 Obesity, unspecified: Secondary | ICD-10-CM | POA: Insufficient documentation

## 2017-03-25 DIAGNOSIS — K912 Postsurgical malabsorption, not elsewhere classified: Secondary | ICD-10-CM | POA: Insufficient documentation

## 2017-03-25 DIAGNOSIS — Z9884 Bariatric surgery status: Secondary | ICD-10-CM | POA: Diagnosis not present

## 2017-03-25 DIAGNOSIS — R69 Illness, unspecified: Secondary | ICD-10-CM | POA: Diagnosis not present

## 2017-03-25 NOTE — Patient Instructions (Signed)
-   Create physical activity routine during the week of at least 30 min/day, 5 days/week.   - Order ProCare multivitamins.

## 2017-03-25 NOTE — Progress Notes (Signed)
Follow-up visit: 15  Weeks Post-Operative RYGB Surgery  Medical Nutrition Therapy:  Appt start time: 11:35 end time: 12:04  Primary concerns today: Post-operative Bariatric Surgery Nutrition Management.  Non scale victories: smaller sizes, used to wear size 48 now 38, 5-6XL to 2-3XL, able to shop in regular stores  Surgery date: 12/08/2016 with [facility] Surgery type: [facility]YGB [facility]: 290.1 lbs Weight today: 285.0 lbs Weight change: 5 lbs loss Total weight lost: 112 lbs loss Weight loss goal: shop for clothes at regular store, live longer, healthier, more fulfillment out of life   TANITA  BODY COMP RESULTS  02/25/2017 03/25/2017   BMI (kg/m^2) N/A N/A   Fat Mass (lbs)     Fat Free Mass (lbs)     Total Body Water (lbs)      Pt arrives with wife and states things have been going well for him; no more drain for fluid. Pt states he is getting at least 80 grams of protein/day, not tracking using [employer] App. Pt's wife states most foods are either grilled or baked. Pt's wife states he plans to purchase [employer] to help with costs due to pt being out of work lately and needing to be on a budget; still taking over-the-counter MVI. Pt states lactose-free milk is working well. Pt reports getting back into the gym soon as they are members at [employer].   Pt arrives with wife stating he needs post-op meal plan. Pt had RYGB with [facility] 2-3 months ago, on 7/23. Pt states he had leaks resulting in 10 liters of fluid being drained, along with multiple hospital stays. Pt currently has one drain and will possibly have it taken out next week. Pt states he tolerates most food well and things are improving for him. Pt states he is not able to handle drinking milk; it makes him feel sick. Pt states he is able to tolerate vegetables well. Pt states he takes over the counter adult multivitamin and calcium 2-3 supplements when he can remember.    Preferred Learning Style:    No preference indicated   Learning Readiness:   Ready  Change in progress  24-hr recall:  B ( AM): cereal, lactose-free milk (8g) Snk ( AM): pickles  L ( PM): 5-6 oz grilled chicken (25-30g) Snk ( PM): sometimes cheerios or greek yogurt (12g) D ( PM): 4 oz barbecue chicken (28g), carrots, cheese (7g) or  3 chicken tenders (26g) Snk ( PM): none Beverages: lactose-free 2% milk, water with flavor packs, Nature's Twist  Fluid intake: water, milk, diet green tea; 64+ oz Estimated total protein intake: ~66g  Medications: See list Supplementation: Adult MVI + 3 Calcium supplements  Using straws: no Drinking while eating: no Having you been chewing well: yes Chewing/swallowing difficulties: no Changes in vision: no Changes to mood/headaches: no, no Hair loss/Cahnges to skin/Changes to nails: no, no, no Any difficulty focusing or concentrating: no Sweating: no Dizziness/Lightheaded: no, no Palpitations: no  Carbonated beverages: no N/V/D/C/GAS: sometimes, no, yes, sometimes, no Abdominal Pain: no Dumping syndrome: no Last Lap-Band fill: N/A  Recent physical activity:  Doing household activities  Progress Towards Goal(s):  In progress.  Handouts given during visit include:  none   Nutritional Diagnosis:  NI-5.7.1 Inadequate protein intake As related to bariatric post-op surgery recommendations.  As evidenced by pt report of less than 80 grams of protein daily.    Intervention:  Nutrition education and counseling. RD educated pt and his wife on the  importance of meeting protein needs, how to track, and different sources of protein. Pt was also educated on appropriate bariatric-specific multivitamins and calcium supplements, including a daily regimen of taking them.   Goals: - Create physical activity routine during the week of at least 30 min/day, 5 days/week.  - Order ProCare multivitamins.  Teaching Method Utilized:  Visual Auditory Hands on  Barriers to  learning/adherence to lifestyle change: none  Demonstrated degree of understanding via:  Teach Back   Monitoring/Evaluation:  Dietary intake, exercise, lap band fills, and body weight. Follow up in 3 months for 7 month post-op visit.

## 2017-04-02 DIAGNOSIS — G4733 Obstructive sleep apnea (adult) (pediatric): Secondary | ICD-10-CM | POA: Diagnosis not present

## 2017-05-02 DIAGNOSIS — G4733 Obstructive sleep apnea (adult) (pediatric): Secondary | ICD-10-CM | POA: Diagnosis not present

## 2017-06-12 DIAGNOSIS — E119 Type 2 diabetes mellitus without complications: Secondary | ICD-10-CM | POA: Diagnosis not present

## 2017-06-12 DIAGNOSIS — G4709 Other insomnia: Secondary | ICD-10-CM | POA: Diagnosis not present

## 2017-06-20 ENCOUNTER — Emergency Department (HOSPITAL_COMMUNITY): Payer: 59

## 2017-06-20 ENCOUNTER — Encounter (HOSPITAL_COMMUNITY): Payer: Self-pay | Admitting: Emergency Medicine

## 2017-06-20 ENCOUNTER — Emergency Department (HOSPITAL_COMMUNITY)
Admission: EM | Admit: 2017-06-20 | Discharge: 2017-06-22 | Disposition: A | Discharge status: Other Acute Inpatient Hospital | Payer: 59 | Attending: Emergency Medicine | Admitting: Emergency Medicine

## 2017-06-20 DIAGNOSIS — T50902A Poisoning by unspecified drugs, medicaments and biological substances, intentional self-harm, initial encounter: Secondary | ICD-10-CM | POA: Diagnosis not present

## 2017-06-20 DIAGNOSIS — Z79899 Other long term (current) drug therapy: Secondary | ICD-10-CM | POA: Insufficient documentation

## 2017-06-20 DIAGNOSIS — S3991XA Unspecified injury of abdomen, initial encounter: Secondary | ICD-10-CM | POA: Diagnosis not present

## 2017-06-20 DIAGNOSIS — Z87891 Personal history of nicotine dependence: Secondary | ICD-10-CM | POA: Insufficient documentation

## 2017-06-20 DIAGNOSIS — T405X2A Poisoning by cocaine, intentional self-harm, initial encounter: Secondary | ICD-10-CM | POA: Diagnosis not present

## 2017-06-20 DIAGNOSIS — F1910 Other psychoactive substance abuse, uncomplicated: Secondary | ICD-10-CM

## 2017-06-20 DIAGNOSIS — F322 Major depressive disorder, single episode, severe without psychotic features: Secondary | ICD-10-CM | POA: Insufficient documentation

## 2017-06-20 DIAGNOSIS — R45851 Suicidal ideations: Secondary | ICD-10-CM | POA: Insufficient documentation

## 2017-06-20 DIAGNOSIS — T424X2A Poisoning by benzodiazepines, intentional self-harm, initial encounter: Secondary | ICD-10-CM | POA: Diagnosis not present

## 2017-06-20 DIAGNOSIS — S299XXA Unspecified injury of thorax, initial encounter: Secondary | ICD-10-CM | POA: Diagnosis not present

## 2017-06-20 DIAGNOSIS — S199XXA Unspecified injury of neck, initial encounter: Secondary | ICD-10-CM | POA: Diagnosis not present

## 2017-06-20 DIAGNOSIS — T407X2A Poisoning by cannabis (derivatives), intentional self-harm, initial encounter: Secondary | ICD-10-CM | POA: Diagnosis not present

## 2017-06-20 DIAGNOSIS — F411 Generalized anxiety disorder: Secondary | ICD-10-CM | POA: Diagnosis not present

## 2017-06-20 DIAGNOSIS — E119 Type 2 diabetes mellitus without complications: Secondary | ICD-10-CM | POA: Insufficient documentation

## 2017-06-20 LAB — RAPID URINE DRUG SCREEN, HOSP PERFORMED
Amphetamines: NOT DETECTED
Barbiturates: NOT DETECTED
Benzodiazepines: POSITIVE — AB
COCAINE: POSITIVE — AB
OPIATES: NOT DETECTED
Tetrahydrocannabinol: POSITIVE — AB

## 2017-06-20 LAB — COMPREHENSIVE METABOLIC PANEL
ALK PHOS: 67 U/L (ref 38–126)
ALT: 15 U/L — ABNORMAL LOW (ref 17–63)
ANION GAP: 8 (ref 5–15)
AST: 20 U/L (ref 15–41)
Albumin: 4 g/dL (ref 3.5–5.0)
BUN: 12 mg/dL (ref 6–20)
CO2: 25 mmol/L (ref 22–32)
CREATININE: 0.72 mg/dL (ref 0.61–1.24)
Calcium: 9 mg/dL (ref 8.9–10.3)
Chloride: 106 mmol/L (ref 101–111)
Glucose, Bld: 104 mg/dL — ABNORMAL HIGH (ref 65–99)
Potassium: 3.2 mmol/L — ABNORMAL LOW (ref 3.5–5.1)
SODIUM: 139 mmol/L (ref 135–145)
Total Bilirubin: 0.6 mg/dL (ref 0.3–1.2)
Total Protein: 6.7 g/dL (ref 6.5–8.1)

## 2017-06-20 LAB — I-STAT CHEM 8, ED
BUN: 10 mg/dL (ref 6–20)
CALCIUM ION: 1.17 mmol/L (ref 1.15–1.40)
CHLORIDE: 103 mmol/L (ref 101–111)
Creatinine, Ser: 0.7 mg/dL (ref 0.61–1.24)
Glucose, Bld: 99 mg/dL (ref 65–99)
HEMATOCRIT: 36 % — AB (ref 39.0–52.0)
Hemoglobin: 12.2 g/dL — ABNORMAL LOW (ref 13.0–17.0)
POTASSIUM: 3.2 mmol/L — AB (ref 3.5–5.1)
SODIUM: 146 mmol/L — AB (ref 135–145)
TCO2: 25 mmol/L (ref 22–32)

## 2017-06-20 LAB — CBC
HCT: 36.4 % — ABNORMAL LOW (ref 39.0–52.0)
Hemoglobin: 12.6 g/dL — ABNORMAL LOW (ref 13.0–17.0)
MCH: 29.9 pg (ref 26.0–34.0)
MCHC: 34.6 g/dL (ref 30.0–36.0)
MCV: 86.5 fL (ref 78.0–100.0)
PLATELETS: 177 10*3/uL (ref 150–400)
RBC: 4.21 MIL/uL — ABNORMAL LOW (ref 4.22–5.81)
RDW: 14.9 % (ref 11.5–15.5)
WBC: 6.5 10*3/uL (ref 4.0–10.5)

## 2017-06-20 LAB — ETHANOL

## 2017-06-20 LAB — CBG MONITORING, ED: Glucose-Capillary: 87 mg/dL (ref 65–99)

## 2017-06-20 LAB — ACETAMINOPHEN LEVEL

## 2017-06-20 LAB — SALICYLATE LEVEL

## 2017-06-20 MED ORDER — IOPAMIDOL (ISOVUE-300) INJECTION 61%
INTRAVENOUS | Status: AC
  Filled 2017-06-20: qty 100

## 2017-06-20 MED ORDER — IBUPROFEN 200 MG PO TABS: 600.0000 mg | ORAL_TABLET | Freq: Three times a day (TID) | ORAL | Status: DC | PRN

## 2017-06-20 MED ORDER — IOPAMIDOL (ISOVUE-300) INJECTION 61%
100.0000 mL | Freq: Once | INTRAVENOUS | Status: AC | PRN
  Administered 2017-06-20: 100 mL via INTRAVENOUS

## 2017-06-20 MED ORDER — SODIUM CHLORIDE 0.9 % IV BOLUS (SEPSIS)
1000.0000 mL | Freq: Once | INTRAVENOUS | Status: AC
  Administered 2017-06-20: 1000 mL via INTRAVENOUS

## 2017-06-20 MED ORDER — POTASSIUM CHLORIDE CRYS ER 20 MEQ PO TBCR
20.0000 meq | EXTENDED_RELEASE_TABLET | Freq: Two times a day (BID) | ORAL | Status: DC
  Administered 2017-06-21 – 2017-06-22 (×3): 20 meq via ORAL
  Filled 2017-06-20 (×3): qty 1

## 2017-06-20 MED ORDER — ONDANSETRON HCL 4 MG PO TABS: 4.0000 mg | ORAL_TABLET | Freq: Three times a day (TID) | ORAL | Status: DC | PRN

## 2017-06-20 MED ORDER — NALOXONE HCL 0.4 MG/ML IJ SOLN
0.4000 mg | Freq: Once | INTRAMUSCULAR | Status: AC
  Administered 2017-06-20: 0.4 mg via INTRAVENOUS
  Filled 2017-06-20: qty 1

## 2017-06-20 MED ORDER — NALOXONE HCL 0.4 MG/ML IJ SOLN
0.4000 mg | Freq: Once | INTRAMUSCULAR | Status: AC
Start: 2017-06-20 — End: 2017-06-20
  Administered 2017-06-20: 0.4 mg via INTRAVENOUS
  Filled 2017-06-20: qty 1

## 2017-06-20 NOTE — ED Notes (Signed)
Patient given spouse's phone number so he is able to call at [facility]9am

## 2017-06-20 NOTE — ED Notes (Signed)
SBAR Report received from previous nurse. Pt received calm and visible on unit. Pt lethargic but responds to touch and his name, but gave no meaningful answers to assessment questions related to current SI/ HI, A/V H, depression, anxiety, or pain at this time, and appears otherwise stable and free of distress. Pt reminded of camera surveillance, q 15 min rounds, and rules of the milieu. Will continue to assess.

## 2017-06-20 NOTE — Patient Outreach (Signed)
CPSS spoke with the patient and provided substance use recovery support. Patient was very agitated and did not want any help with CPSS services at this time. Patient is not interested in any substance use recovery supports like treatment or a support group. CPSS provided CPSS contact information. CPSS still encouraged the patient to contact CPSS at anytime for substance use recovery support or help with resources.

## 2017-06-20 NOTE — ED Notes (Signed)
Both IV's taken out

## 2017-06-20 NOTE — ED Notes (Signed)
Pt has been changed and wanded by security. He is alert and ambulatory without assistance.

## 2017-06-20 NOTE — ED Notes (Signed)
Visitor at bedside.

## 2017-06-20 NOTE — ED Notes (Signed)
Pt admitted to room #40. Pt drowsy on approach,  Sad affect, tearful at times. Pt endorsing SI. Denies HI/AVH. Pt reports being "down on life" Encouragement and support provided. Special checks q 15 mins in place for safety, Video monitoring in place. Will continue to monitor.
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
## 2017-06-20 NOTE — BH Assessment (Signed)
[facility] Assessment Progress Note  Case was staffed with Parks FNP who recommended a inpatient admission as appropriate bed placement is investigated.     

## 2017-06-20 NOTE — ED Notes (Signed)
Pt talking on hallway phone.  

## 2017-06-20 NOTE — ED Notes (Signed)
Spoke with [facility] at [employer] [employer] who stated case had not been called in. Information provided by Clinical research associatewriter, after reviewing  Chart with Waynetta SandyBeth, she stated he is clear and free to be released or follow POC.
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
## 2017-06-21 DIAGNOSIS — X828XXA Other intentional self-harm by crashing of motor vehicle, initial encounter: Secondary | ICD-10-CM

## 2017-06-21 DIAGNOSIS — T405X2A Poisoning by cocaine, intentional self-harm, initial encounter: Secondary | ICD-10-CM

## 2017-06-21 DIAGNOSIS — F1910 Other psychoactive substance abuse, uncomplicated: Secondary | ICD-10-CM

## 2017-06-21 DIAGNOSIS — T1491XA Suicide attempt, initial encounter: Secondary | ICD-10-CM

## 2017-06-21 DIAGNOSIS — T407X2A Poisoning by cannabis (derivatives), intentional self-harm, initial encounter: Secondary | ICD-10-CM

## 2017-06-21 DIAGNOSIS — G4700 Insomnia, unspecified: Secondary | ICD-10-CM

## 2017-06-21 DIAGNOSIS — T424X2A Poisoning by benzodiazepines, intentional self-harm, initial encounter: Secondary | ICD-10-CM

## 2017-06-21 DIAGNOSIS — Z87891 Personal history of nicotine dependence: Secondary | ICD-10-CM

## 2017-06-21 DIAGNOSIS — F322 Major depressive disorder, single episode, severe without psychotic features: Secondary | ICD-10-CM

## 2017-06-21 LAB — BASIC METABOLIC PANEL
ANION GAP: 8 (ref 5–15)
BUN: 9 mg/dL (ref 6–20)
CHLORIDE: 105 mmol/L (ref 101–111)
CO2: 25 mmol/L (ref 22–32)
Calcium: 8.9 mg/dL (ref 8.9–10.3)
Creatinine, Ser: 0.73 mg/dL (ref 0.61–1.24)
GFR calc Af Amer: >60 mL/min (ref >60)
GFR calc non Af Amer: >60 mL/min (ref >60)
GLUCOSE: 91 mg/dL (ref 65–99)
Potassium: 3.9 mmol/L (ref 3.5–5.1)
Sodium: 138 mmol/L (ref 135–145)

## 2017-06-21 MED ORDER — TRAZODONE HCL 100 MG PO TABS
100.0000 mg | ORAL_TABLET | Freq: Every day | ORAL | Status: DC
  Administered 2017-06-21: 100 mg via ORAL
  Filled 2017-06-21: qty 1

## 2017-06-21 MED ORDER — GABAPENTIN 300 MG PO CAPS
300.0000 mg | ORAL_CAPSULE | Freq: Two times a day (BID) | ORAL | Status: DC
  Administered 2017-06-21 – 2017-06-22 (×3): 300 mg via ORAL
  Filled 2017-06-21 (×3): qty 1

## 2017-06-21 MED ORDER — FLUOXETINE HCL 20 MG PO CAPS
20.0000 mg | ORAL_CAPSULE | Freq: Every day | ORAL | Status: DC
  Administered 2017-06-21 – 2017-06-22 (×2): 20 mg via ORAL
  Filled 2017-06-21 (×2): qty 1

## 2017-06-21 NOTE — ED Notes (Signed)
SBAR Report received from previous nurse. Pt received calm and visible on unit. Pt denies current SI/ HI, A/V H, depression, anxiety, or pain at this time, and appears otherwise stable and free of distress. Pt endorses remorse for actions is more engaged today than last night.  Pt reminded of camera surveillance, q 15 min rounds, and rules of the milieu. Will continue to assess.

## 2017-06-21 NOTE — ED Notes (Signed)
Patient alert, oriented but not very talkative to nurse.  "I don't even know why I am here."  Offered patient a shower but he declined at this time.

## 2017-06-21 NOTE — ED Notes (Signed)
Patient taking a shower.  Calm and cooperative.
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
## 2017-06-21 NOTE — ED Notes (Signed)
Albert White in lab notified of AM lab draw.

## 2017-06-21 NOTE — Progress Notes (Signed)
[facility] states [facility] will review for possible inpt admission. TTS will follow up.  Princess BruinsAquicha Noriel Guthrie, MSW, LCSW Therapeutic Triage Specialist  [phone]

## 2017-06-22 ENCOUNTER — Inpatient Hospital Stay (HOSPITAL_COMMUNITY)
Admission: AD | Admit: 2017-06-22 | Discharge: 2017-06-26 | DRG: 885 | Disposition: A | Discharge status: Home / Self Care | Payer: 59 | Source: Intra-hospital | Attending: Psychiatry | Admitting: Psychiatry

## 2017-06-22 ENCOUNTER — Encounter (HOSPITAL_COMMUNITY): Payer: Self-pay

## 2017-06-22 ENCOUNTER — Other Ambulatory Visit: Payer: Self-pay

## 2017-06-22 DIAGNOSIS — T407X2A Poisoning by cannabis (derivatives), intentional self-harm, initial encounter: Secondary | ICD-10-CM | POA: Diagnosis not present

## 2017-06-22 DIAGNOSIS — Z9884 Bariatric surgery status: Secondary | ICD-10-CM | POA: Diagnosis not present

## 2017-06-22 DIAGNOSIS — F515 Nightmare disorder: Secondary | ICD-10-CM | POA: Diagnosis present

## 2017-06-22 DIAGNOSIS — F1310 Sedative, hypnotic or anxiolytic abuse, uncomplicated: Secondary | ICD-10-CM | POA: Diagnosis not present

## 2017-06-22 DIAGNOSIS — Z8249 Family history of ischemic heart disease and other diseases of the circulatory system: Secondary | ICD-10-CM

## 2017-06-22 DIAGNOSIS — E119 Type 2 diabetes mellitus without complications: Secondary | ICD-10-CM | POA: Diagnosis not present

## 2017-06-22 DIAGNOSIS — Y32XXXA Crashing of motor vehicle, undetermined intent, initial encounter: Secondary | ICD-10-CM | POA: Diagnosis not present

## 2017-06-22 DIAGNOSIS — F332 Major depressive disorder, recurrent severe without psychotic features: Principal | ICD-10-CM | POA: Diagnosis present

## 2017-06-22 DIAGNOSIS — F419 Anxiety disorder, unspecified: Secondary | ICD-10-CM | POA: Diagnosis not present

## 2017-06-22 DIAGNOSIS — Z87891 Personal history of nicotine dependence: Secondary | ICD-10-CM | POA: Diagnosis not present

## 2017-06-22 DIAGNOSIS — F1410 Cocaine abuse, uncomplicated: Secondary | ICD-10-CM | POA: Diagnosis not present

## 2017-06-22 DIAGNOSIS — F1920 Other psychoactive substance dependence, uncomplicated: Secondary | ICD-10-CM

## 2017-06-22 DIAGNOSIS — Z915 Personal history of self-harm: Secondary | ICD-10-CM

## 2017-06-22 DIAGNOSIS — R450 Nervousness: Secondary | ICD-10-CM | POA: Diagnosis not present

## 2017-06-22 DIAGNOSIS — T1491XA Suicide attempt, initial encounter: Secondary | ICD-10-CM | POA: Diagnosis not present

## 2017-06-22 DIAGNOSIS — Z9149 Other personal history of psychological trauma, not elsewhere classified: Secondary | ICD-10-CM | POA: Diagnosis not present

## 2017-06-22 DIAGNOSIS — T405X2A Poisoning by cocaine, intentional self-harm, initial encounter: Secondary | ICD-10-CM | POA: Diagnosis not present

## 2017-06-22 DIAGNOSIS — G4700 Insomnia, unspecified: Secondary | ICD-10-CM | POA: Diagnosis present

## 2017-06-22 DIAGNOSIS — F322 Major depressive disorder, single episode, severe without psychotic features: Secondary | ICD-10-CM | POA: Diagnosis not present

## 2017-06-22 DIAGNOSIS — T424X2A Poisoning by benzodiazepines, intentional self-harm, initial encounter: Secondary | ICD-10-CM | POA: Diagnosis not present

## 2017-06-22 DIAGNOSIS — R109 Unspecified abdominal pain: Secondary | ICD-10-CM | POA: Diagnosis present

## 2017-06-22 DIAGNOSIS — F1210 Cannabis abuse, uncomplicated: Secondary | ICD-10-CM | POA: Diagnosis not present

## 2017-06-22 LAB — BASIC METABOLIC PANEL
ANION GAP: 6 (ref 5–15)
BUN: 10 mg/dL (ref 6–20)
CHLORIDE: 107 mmol/L (ref 101–111)
CO2: 28 mmol/L (ref 22–32)
Calcium: 9 mg/dL (ref 8.9–10.3)
Creatinine, Ser: 0.75 mg/dL (ref 0.61–1.24)
GFR calc non Af Amer: >60 mL/min (ref >60)
GLUCOSE: 119 mg/dL — AB (ref 65–99)
POTASSIUM: 4 mmol/L (ref 3.5–5.1)
Sodium: 141 mmol/L (ref 135–145)

## 2017-06-22 MED ORDER — GABAPENTIN 300 MG PO CAPS
ORAL_CAPSULE | ORAL | Status: AC
  Filled 2017-06-22: qty 1

## 2017-06-22 MED ORDER — ACETAMINOPHEN 325 MG PO TABS
650.0000 mg | ORAL_TABLET | Freq: Four times a day (QID) | ORAL | Status: DC | PRN
  Administered 2017-06-23 – 2017-06-25 (×4): 650 mg via ORAL
  Filled 2017-06-22 (×4): qty 2

## 2017-06-22 MED ORDER — GABAPENTIN 300 MG PO CAPS
300.0000 mg | ORAL_CAPSULE | Freq: Two times a day (BID) | ORAL | Status: DC
  Administered 2017-06-22 – 2017-06-23 (×2): 300 mg via ORAL
  Filled 2017-06-22 (×6): qty 1

## 2017-06-22 MED ORDER — TRAZODONE HCL 100 MG PO TABS
ORAL_TABLET | ORAL | Status: AC
  Filled 2017-06-22: qty 1

## 2017-06-22 MED ORDER — TRAZODONE HCL 100 MG PO TABS
100.0000 mg | ORAL_TABLET | Freq: Every day | ORAL | Status: DC
  Administered 2017-06-22: 100 mg via ORAL
  Filled 2017-06-22 (×3): qty 1

## 2017-06-22 MED ORDER — POTASSIUM CHLORIDE CRYS ER 20 MEQ PO TBCR
20.0000 meq | EXTENDED_RELEASE_TABLET | Freq: Two times a day (BID) | ORAL | Status: AC
  Administered 2017-06-22 – 2017-06-23 (×3): 20 meq via ORAL
  Filled 2017-06-22 (×3): qty 1

## 2017-06-22 MED ORDER — HYDROXYZINE HCL 25 MG PO TABS
25.0000 mg | ORAL_TABLET | Freq: Three times a day (TID) | ORAL | Status: DC
  Administered 2017-06-23 (×2): 25 mg via ORAL
  Filled 2017-06-22 (×7): qty 1

## 2017-06-22 MED ORDER — POTASSIUM CHLORIDE CRYS ER 20 MEQ PO TBCR
EXTENDED_RELEASE_TABLET | ORAL | Status: AC
  Filled 2017-06-22: qty 1

## 2017-06-22 MED ORDER — MAGNESIUM HYDROXIDE 400 MG/5ML PO SUSP: 30.0000 mL | Freq: Every day | ORAL | Status: DC | PRN

## 2017-06-22 MED ORDER — ALUM & MAG HYDROXIDE-SIMETH 200-200-20 MG/5ML PO SUSP: 30.0000 mL | ORAL | Status: DC | PRN

## 2017-06-22 MED ORDER — FLUOXETINE HCL 20 MG PO CAPS
20.0000 mg | ORAL_CAPSULE | Freq: Every day | ORAL | Status: DC
  Administered 2017-06-23: 20 mg via ORAL
  Filled 2017-06-22 (×3): qty 1

## 2017-06-22 NOTE — ED Notes (Signed)
[facility]
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
## 2017-06-22 NOTE — ED Notes (Signed)
[facility] Ambulatory and in stable condition. No distress noted. Belongings given to Teacher, early years/prelaw enforcement agent.

## 2017-06-22 NOTE — ED Notes (Signed)
Patient seen sleeping at the time of shift change. No distress noted. Will continue to monitor patient.  

## 2017-06-22 NOTE — Progress Notes (Signed)
Report received from admitting RN.  Met with pt and introduced self.  Support and encouragement provided.  Pt denies SI/HI, hallucinations, pain, and withdrawal symptoms.  Medications administered per order.  He verbally contracts for safety.  Pt reports he will inform staff of needs and concerns.  Will continue to monitor and assess.

## 2017-06-22 NOTE — ED Notes (Signed)
[facility] They are not ready yet and will call.

## 2017-06-22 NOTE — Tx Team (Signed)
Initial Treatment Plan 06/22/2017 11:49 PM Albert PetrinSteven T Ranieri ZOX:[id number]    PATIENT STRESSORS: Health problems Medication change or noncompliance Substance abuse Traumatic event   PATIENT STRENGTHS: Average or above average intelligence Motivation for treatment/growth Supportive family/friends   PATIENT IDENTIFIED PROBLEMS: "get clean"  "get rid of these nightmares"  "overall mood"  Substance Abuse  Depression             DISCHARGE CRITERIA:  Ability to meet basic life and health needs Adequate post-discharge living arrangements Improved stabilization in mood, thinking, and/or behavior Medical problems require only outpatient monitoring Motivation to continue treatment in a less acute level of care Need for constant or close observation no longer present Reduction of life-threatening or endangering symptoms to within safe limits Safe-care adequate arrangements made Verbal commitment to aftercare and medication compliance Withdrawal symptoms are absent or subacute and managed without 24-hour nursing intervention  PRELIMINARY DISCHARGE PLAN: Outpatient therapy  PATIENT/FAMILY INVOLVEMENT: This treatment plan has been presented to and reviewed with the patient, Albert White.  The patient and family have been given the opportunity to ask questions and make suggestions.  Ferrel LoganAmanda A Caedyn Raygoza, RN 06/22/2017, 11:49 PM

## 2017-06-22 NOTE — ED Notes (Signed)
Patient awake. [facility]. [employer] called for transportation.

## 2017-06-22 NOTE — BH Assessment (Signed)
[facility]  Per Juanetta BeetsJacqueline Norman, DO, this pt requires psychiatric hospitalization.  Malva LimesLinsey Strader, RN, [facility] R[id number]; she will call when bed is ready.  Pt presents under IVC initiated by EDP April Palumbl, MD, and [facility].  Pt's nurse, Diane, has been notified, and agrees to call report to [phone].  Pt is to be transported via [employer].   Doylene Canninghomas Krrish Freund, [place] Behavioral Health Coordinator [phone]

## 2017-06-22 NOTE — ED Notes (Signed)
Pt's wife has been very anxious concerning pt's admission to the Acute Unit. Pt did sign consent for information to be shared with her. Per night nurse she called last night, after pt was asleep, and was demanding to see the doctor. According to staff she was not satisfied that she would have to wait until morning and she was yelling and demanding towards pt's nurse. This morning she called and was informed, prior to doctor's rounds, that pt would be admitted and that ED Psychiatry team would probably not round on him this morning because that decision was already made. She appeared to be satisfied that he is being admitted to a Psychiatric Hospital and did not insist on being here early. When she visited around 10:20. When she did visit she asked to speak with the charge nurse. This writer stepped outside the unit with her to talk. She was irate because her husband had told her that his television channel had not been changed for him. She asserted that it was not turned on until 45 minutes after he asked for it to be turned on. This Clinical research associatewriter assured her that this Clinical research associatewriter did change the channel for him and that the TV was already on at that time. She accused this Clinical research associatewriter of being rude. It was explained that rounds are made every 15 minutes on the unit and the channels are changed at that time. It was explained that this writer had no knowledge of the TV not being turned on for 45 minutes, but offered an apology. Pt visited with her husband for the 30 minutes allowed with an hour between visits. This also made her angry. She said, "When did that rule start because I was here yesterday and I stayed longer?"

## 2017-06-22 NOTE — Consult Note (Signed)
[place] Psychiatry Consult   Reason for Consult:  Intentional overdose Referring Physician:  EDP Patient Identification: Albert White MRN:  [id number] Principal Diagnosis: Major depressive disorder, single episode, severe ([place]) Diagnosis:   Patient Active Problem List   Diagnosis Date Noted  . Major depressive disorder, single episode, severe ([place]) [F32.2] 06/21/2017  . Polysubstance abuse ([place]) [F19.10] 06/21/2017  . Pain management [R52]   . Hypomagnesemia [E83.42]   . Acute pulmonary edema (HCC) [J81.0]   . Peritonitis ([place]) [K65.9]   . Gastrointestinal anastomotic leak [K91.89]   . Pleural effusion on left [J90]   . Postprocedural intraabdominal abscess [T81.49XA] 01/15/2017  . Increased band cell count [D72.825] 01/06/2017  . Generalized edema [R60.1] 01/06/2017  . Abdominal pain [R10.9] 12/30/2016  . Former smoker [B55.974] 08/08/2016  . Morbid obesity with BMI of 50.0-59.9, adult ([place]) [E66.01, Z68.43] 06/25/2016  . OSA (obstructive sleep apnea) [G47.33] 06/25/2016  . Vitamin D deficiency [E55.9] 06/25/2016  . Type 2 diabetes mellitus without complication, without long-term current use of insulin ([place]) [E11.9] 02/29/2016  . Mixed hyperlipidemia [E78.2] 02/29/2016  . Cellulitis and abscess of neck [L03.221, L02.11] 02/29/2016  . Cellulitis [L03.90] 12/14/2013  . Morbid obesity ([place]) [E66.01] 12/14/2013  . Neck abscess [L02.11] 12/14/2013  . Hidradenitis [L73.2] 08/09/2012    Total Time spent with patient: 30 minutes  Subjective:   Albert White is a 42 y.o. male patient admitted with an intentional overdose.  HPI:  Pt was seen and chart reviewed with treatment team and Dr Mariea Clonts. Pt stated he did not remember taking the pills (48 0.5 mg Ativan), smoking cocaine and smoking marijuana and then driving his car. Pt is guarded and vague about the events that led up to his coming to the hospital. Pt stated he was sober for 6-7 years and relapsed a few days ago.  Pt would benefit from an inpatient psychiatric admission for crisis stabilization and medication management.   Past Psychiatric History: As above  Risk to Self: Suicidal Ideation: Yes-Currently Present(Per IVC) Suicidal Intent: Yes-Currently Present Is patient at risk for suicide?: Yes Suicidal Plan?: Yes-Currently Present Specify Current Suicidal Plan: Overdose and crash car Access to Means: Yes Specify Access to Suicidal Means: Pt had a vehicle What has been your use of drugs/alcohol within the last 12 months?: Current use How many times?: 0 Other Self Harm Risks: NA Triggers for Past Attempts: Other (Comment)(SA use) Intentional Self Injurious Behavior: None Risk to Others: Homicidal Ideation: No Thoughts of Harm to Others: No Current Homicidal Intent: No Current Homicidal Plan: No Access to Homicidal Means: No Identified Victim: NA History of harm to others?: No Assessment of Violence: None Noted Violent Behavior Description: NA Does patient have access to weapons?: No Criminal Charges Pending?: No Does patient have a court date: No Prior Inpatient Therapy: Prior Inpatient Therapy: No Prior Therapy Dates: NA Prior Therapy Facilty/Provider(s): NA Reason for Treatment: NA Prior Outpatient Therapy: Prior Outpatient Therapy: Yes Prior Therapy Dates: 2018 Prior Therapy Facilty/Provider(s): [facility] medical Reason for Treatment: Med mang Does patient have an ACCT team?: No Does patient have Intensive In-House Services?  : No Does patient have [facility] services? : No Does patient have P4CC services?: No  Past Medical History:  Past Medical History:  Diagnosis Date  . Abscess   . Diabetes mellitus without complication ([place])   . Hidradenitis suppurativa   . Hyperlipemia   . Morbid obesity ([place])   . Vertigo     Past Surgical History:  Procedure Laterality  Date  . INCISION AND DRAINAGE ABSCESS Right 12/15/2013   Procedure: INCISION AND DRAINAGE Right Posterior neck  ABSCESS;  Surgeon: Gwenyth Ober, MD;  Location: [facility];  Service: General;  Laterality: Right;  . INCISION AND DRAINAGE DEEP NECK ABSCESS     Hidradenitis - 3-4 times at [facility] and [place]  . IR CATHETER TUBE CHANGE  02/18/2017  . IR RADIOLOGIST EVAL & MGMT  02/11/2017  . IR RADIOLOGIST EVAL & MGMT  03/03/2017  . IR THORACENTESIS ASP PLEURAL SPACE W/IMG GUIDE  01/23/2017   Family History:  Family History  Problem Relation Age of Onset  . Arthritis Mother   . Hypertension Mother   . Cancer Other   . Heart disease Other    Family Psychiatric  History: Unknown Social History:  Social History   Substance and Sexual Activity  Alcohol Use No     Social History   Substance and Sexual Activity  Drug Use No    Social History   Socioeconomic History  . Marital status: Married    Spouse name: None  . Number of children: 0  . Years of education: 7th  . Highest education level: None  Social Needs  . Financial resource strain: None  . Food insecurity - worry: Never true  . Food insecurity - inability: Never true  . Transportation needs - medical: None  . Transportation needs - non-medical: None  Occupational History  . Occupation: Architect   Tobacco Use  . Smoking status: Former Smoker    Packs/day: 1.00    Last attempt to quit: 11/27/2013    Years since quitting: 3.5  . Smokeless tobacco: Never Used  Substance and Sexual Activity  . Alcohol use: No  . Drug use: No  . Sexual activity: None  Other Topics Concern  . None  Social History Narrative   Drinks about 1 soda a day    Additional Social History: N/A    Allergies:  No Known Allergies  Labs:  Results for orders placed or performed during the hospital encounter of 06/20/17 (from the past 48 hour(s))  Basic metabolic panel     Status: None   Collection Time: 06/21/17  1:15 PM  Result Value Ref Range   Sodium 138 135 - 145 mmol/L    Comment: DELTA CHECK NOTED   Potassium 3.9 3.5 - 5.1 mmol/L    Comment: DELTA  CHECK NOTED   Chloride 105 101 - 111 mmol/L   CO2 25 22 - 32 mmol/L   Glucose, Bld 91 65 - 99 mg/dL   BUN 9 6 - 20 mg/dL   Creatinine, Ser 0.73 0.61 - 1.24 mg/dL   Calcium 8.9 8.9 - 10.3 mg/dL   GFR calc non Af Amer >60 >60 mL/min   GFR calc Af Amer >60 >60 mL/min    Comment: (NOTE) The eGFR has been calculated using the CKD EPI equation. This calculation has not been validated in all clinical situations. eGFR's persistently <60 mL/min signify possible Chronic Kidney Disease.    Anion gap 8 5 - 15    Comment: Performed at [facility], [place] [address], [place], [place] [address]  Basic metabolic panel     Status: Abnormal   Collection Time: 06/22/17  7:20 AM  Result Value Ref Range   Sodium 141 135 - 145 mmol/L   Potassium 4.0 3.5 - 5.1 mmol/L   Chloride 107 101 - 111 mmol/L   CO2 28 22 - 32 mmol/L   Glucose,  Bld 119 (H) 65 - 99 mg/dL   BUN 10 6 - 20 mg/dL   Creatinine, Ser 0.75 0.61 - 1.24 mg/dL   Calcium 9.0 8.9 - 10.3 mg/dL   GFR calc non Af Amer >60 >60 mL/min   GFR calc Af Amer >60 >60 mL/min    Comment: (NOTE) The eGFR has been calculated using the CKD EPI equation. This calculation has not been validated in all clinical situations. eGFR's persistently <60 mL/min signify possible Chronic Kidney Disease.    Anion gap 6 5 - 15    Comment: Performed at [facility], [place] [address] [place], [place] [address]    Current Facility-Administered Medications  Medication Dose Route Frequency Provider Last Rate Last Dose  . FLUoxetine (PROZAC) capsule 20 mg  20 mg Oral Daily Akintayo, Mojeed, MD   20 mg at 06/22/17 1032  . gabapentin (NEURONTIN) capsule 300 mg  300 mg Oral BID Darleene Cleaver, Mojeed, MD   300 mg at 06/22/17 1032  . ibuprofen (ADVIL,MOTRIN) tablet 600 mg  600 mg Oral Q8H PRN Palumbo, April, MD      . ondansetron [facility]) tablet 4 mg  4 mg Oral Q8H PRN Palumbo, April, MD      . potassium chloride SA (K-DUR,KLOR-CON) CR tablet 20  mEq  20 mEq Oral BID Palumbo, April, MD   20 mEq at 06/22/17 1032  . traZODone (DESYREL) tablet 100 mg  100 mg Oral QHS Lindon Romp A, NP   100 mg at 06/21/17 2109   Current Outpatient Medications  Medication Sig Dispense Refill  . LORazepam (ATIVAN) 0.5 MG tablet Take 0.5 mg by mouth 2 (two) times daily.    . Multiple Vitamin (MULTIVITAMIN WITH MINERALS) TABS tablet Take 1 tablet by mouth daily.    Marland Kitchen acetaminophen (TYLENOL) 325 MG tablet Take 2 tablets (650 mg total) by mouth every 8 (eight) hours. (Patient not taking: Reported on 06/20/2017) 60 tablet 0    Musculoskeletal: Strength & Muscle Tone: within normal limits Gait & Station: normal Patient leans: N/A  Psychiatric Specialty Exam: Physical Exam  Nursing note and vitals reviewed. Constitutional: He is oriented to person, place, and time. He appears well-developed and well-nourished.  HENT:  Head: Normocephalic.  Neck: Normal range of motion.  Respiratory: Effort normal.  Musculoskeletal: Normal range of motion.  Neurological: He is alert and oriented to person, place, and time.  Psychiatric: His speech is normal and behavior is normal. His mood appears anxious. Thought content is paranoid. Cognition and memory are normal. He expresses impulsivity. He exhibits a depressed mood. He expresses suicidal ideation. He expresses suicidal plans.    Review of Systems  Psychiatric/Behavioral: Positive for depression, substance abuse and suicidal ideas. Negative for hallucinations and memory loss. The patient is nervous/anxious. The patient does not have insomnia.   All other systems reviewed and are negative.   Blood pressure 107/61, pulse 60, temperature 97.9 F (36.6 C), temperature source Oral, resp. rate 12, SpO2 96 %.There is no height or weight on file to calculate BMI.  General Appearance: Casual  Eye Contact:  Good  Speech:  Clear and Coherent and Normal Rate  Volume:  Normal  Mood:  Anxious, Depressed and Dysphoric  Affect:   Congruent and Depressed  Thought Process:  Coherent, Goal Directed and Linear  Orientation:  Full (Time, Place, and Person)  Thought Content:  Logical  Suicidal Thoughts:  Yes.  with intent/plan  Homicidal Thoughts:  No  Memory:  Immediate;   Good Recent;  Good Remote;   Fair  Judgement:  Poor  Insight:  Lacking  Psychomotor Activity:  Normal  Concentration:  Concentration: Good and Attention Span: Fair  Recall:  Good  Fund of Knowledge:  Good  Language:  Good  Akathisia:  No  Handed:  Right  AIMS (if indicated):    N/A  Assets:  Agricultural consultant Housing Social Support  ADL's:  Intact  Cognition:  WNL  Sleep:    N/A     Treatment Plan Summary: Daily contact with patient to assess and evaluate symptoms and progress in treatment and Medication management (see MAR)  Disposition: Recommend psychiatric Inpatient admission when medically cleared.  Ethelene Hal, NP 06/22/2017 12:31 PM   Patient seen face-to-face for psychiatric evaluation, chart reviewed and case discussed with the physician extender and developed treatment plan. Reviewed the information documented and agree with the treatment plan.  Buford Dresser, DO 06/23/17 11:30 PM

## 2017-06-22 NOTE — ED Notes (Signed)
Pt has been calm and cooperative with no complaints voiced.

## 2017-06-22 NOTE — ED Notes (Signed)
Lab called 2nd time for lab draw. Albert White reported to Clinical research associatewriter that she will be up shortly.

## 2017-06-23 DIAGNOSIS — T1491XA Suicide attempt, initial encounter: Secondary | ICD-10-CM

## 2017-06-23 DIAGNOSIS — Y32XXXA Crashing of motor vehicle, undetermined intent, initial encounter: Secondary | ICD-10-CM

## 2017-06-23 DIAGNOSIS — T424X2A Poisoning by benzodiazepines, intentional self-harm, initial encounter: Secondary | ICD-10-CM

## 2017-06-23 DIAGNOSIS — F1210 Cannabis abuse, uncomplicated: Secondary | ICD-10-CM

## 2017-06-23 DIAGNOSIS — F419 Anxiety disorder, unspecified: Secondary | ICD-10-CM

## 2017-06-23 DIAGNOSIS — G4700 Insomnia, unspecified: Secondary | ICD-10-CM

## 2017-06-23 DIAGNOSIS — Z9149 Other personal history of psychological trauma, not elsewhere classified: Secondary | ICD-10-CM

## 2017-06-23 DIAGNOSIS — F1410 Cocaine abuse, uncomplicated: Secondary | ICD-10-CM

## 2017-06-23 DIAGNOSIS — F332 Major depressive disorder, recurrent severe without psychotic features: Principal | ICD-10-CM

## 2017-06-23 DIAGNOSIS — F1310 Sedative, hypnotic or anxiolytic abuse, uncomplicated: Secondary | ICD-10-CM

## 2017-06-23 DIAGNOSIS — F515 Nightmare disorder: Secondary | ICD-10-CM

## 2017-06-23 MED ORDER — HYDROXYZINE HCL 25 MG PO TABS
25.0000 mg | ORAL_TABLET | Freq: Four times a day (QID) | ORAL | Status: DC | PRN
  Administered 2017-06-23 – 2017-06-26 (×7): 25 mg via ORAL
  Filled 2017-06-23 (×7): qty 1

## 2017-06-23 MED ORDER — GABAPENTIN 300 MG PO CAPS
300.0000 mg | ORAL_CAPSULE | Freq: Three times a day (TID) | ORAL | Status: DC
  Administered 2017-06-23 – 2017-06-26 (×9): 300 mg via ORAL
  Filled 2017-06-23 (×15): qty 1

## 2017-06-23 MED ORDER — SERTRALINE HCL 50 MG PO TABS
50.0000 mg | ORAL_TABLET | Freq: Every day | ORAL | Status: DC
  Administered 2017-06-23 – 2017-06-26 (×4): 50 mg via ORAL
  Filled 2017-06-23 (×8): qty 1

## 2017-06-23 MED ORDER — PRAZOSIN HCL 1 MG PO CAPS
1.0000 mg | ORAL_CAPSULE | Freq: Every day | ORAL | Status: DC
  Administered 2017-06-23 – 2017-06-25 (×3): 1 mg via ORAL
  Filled 2017-06-23 (×6): qty 1

## 2017-06-23 MED ORDER — TRAZODONE HCL 100 MG PO TABS
100.0000 mg | ORAL_TABLET | Freq: Every evening | ORAL | Status: DC | PRN
  Administered 2017-06-23 – 2017-06-25 (×3): 100 mg via ORAL
  Filled 2017-06-23 (×2): qty 1

## 2017-06-23 NOTE — Progress Notes (Addendum)
D: Pt was in the dayroom upon initial approach.  Pt presents with anxious, depressed affect and mood.  He reports he had a good visit with his wife tonight.  His goal today was to "work on myself, think positive thoughts."  He reports he had "a few" negative thoughts but was able to replace them with positive ones.  Pt denies SI/HI, denies hallucinations, reports back pain of 5/10, denies withdrawal symptoms.  Pt is respectful and pleasant when interacting with others.  Pt attended evening group.    A: Introduced self to pt.  Actively listened to pt and offered support and encouragement.   PRN medication administered for anxiety, sleep, and pain.  Medication administered per order.  Q15 minute safety checks maintained.  R: Pt is safe on the unit.  Pt is compliant with medications.  Pt verbally contracts for safety.  Will continue to monitor and assess.

## 2017-06-23 NOTE — [facility] Suicide Risk Assessment (Signed)
[facility] INPATIENT:  Family/Significant Other Suicide Prevention Education  Suicide Prevention Education:  Education Completed: Albert White (p'ts wife) [phone] has been identified by the patient as the family member/significant other with whom the patient will be residing, and identified as the person(s) who will aid the patient in the event of a mental health crisis (suicidal ideations/suicide attempt).  With written consent from the patient, the family member/significant other has been provided the following suicide prevention education, prior to the and/or following the discharge of the patient.  The suicide prevention education provided includes the following:  Suicide risk factors  Suicide prevention and interventions  National Suicide Hotline telephone number  [facility] assessment telephone number  [facility] Emergency Assistance 911  [facility] and/or Residential Mobile Crisis Unit telephone number  Request made of family/significant other to:  Remove weapons (e.g., guns, rifles, knives), all items previously/currently identified as safety concern.    Remove drugs/medications (over-the-counter, prescriptions, illicit drugs), all items previously/currently identified as a safety concern.  The family member/significant other verbalizes understanding of the suicide prevention education information provided.  The family member/significant other agrees to remove the items of safety concern listed above.  Pt's wife shared that pt personality changed after his bariatric surgery in July 2018. "He used to smile all the time and was happy and engaged with me." She reports that since his surgery, he has become flat/depressed, despite major weightloss. Pt had sepsis shortly after the surgery and was hospitalized for 36 days. "he was alone for a lot of that time and has very limited family supports." Pt's wife removed gun from the home and pt no longer has access to  it. "He has been isolating lately and relapsed on Friday. He's never said he was suicidal before." Pt's wife reports that pt had a lot of grief/loss/trauma growing up. "Maybe that stuff is starting to resurface." She was relieved to learn that pt was open to medication management/counseling on an outpatient basis. SPE and aftercare options reviewed with pt's wife. She is comfortable with pt returning home with her at discharge.   Albert Bazar N Smart LCSW 06/23/2017, 11:47 AM
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
## 2017-06-23 NOTE — [facility] Group Notes (Signed)
LCSW Group Therapy Note   06/23/2017 1:15pm   Type of Therapy and Topic:  Group Therapy:  Overcoming Obstacles   Participation Level:  Minimal   Description of Group:    In this group patients will be encouraged to explore what they see as obstacles to their own wellness and recovery. They will be guided to discuss their thoughts, feelings, and behaviors related to these obstacles. The group will process together ways to cope with barriers, with attention given to specific choices patients can make. Each patient will be challenged to identify changes they are motivated to make in order to overcome their obstacles. This group will be process-oriented, with patients participating in exploration of their own experiences as well as giving and receiving support and challenge from other group members.   Therapeutic Goals: 1. Patient will identify personal and current obstacles as they relate to admission. 2. Patient will identify barriers that currently interfere with their wellness or overcoming obstacles.  3. Patient will identify feelings, thought process and behaviors related to these barriers. 4. Patient will identify two changes they are willing to make to overcome these obstacles:      Summary of Patient Progress   Albert White was attentive during group and actively listened as others shared but did not actively participate in group discussion unless prompted by CSW. He continues to present with depressed mood and flat affect. Albert White is looking forward to outpatient therapy and reports that his wife "is very supportive." Albert White continues to demonstrate improving insight with some progress in the group setting.    Therapeutic Modalities:   Cognitive Behavioral Therapy Solution Focused Therapy Motivational Interviewing Relapse Prevention Therapy  Ledell PeoplesHeather N Smart, LCSW 06/23/2017 12:44 PM

## 2017-06-23 NOTE — Plan of Care (Signed)
  Progressing Activity: Interest or engagement in activities will improve 06/23/2017 1623 - Progressing by Lenord Fellersopson, Alexsia Klindt Elizabeth, RN
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
## 2017-06-23 NOTE — BHH Suicide Risk Assessment (Signed)
[facility] Admission Suicide Risk Assessment   Nursing information obtained from:  Patient Demographic factors:  Male, Low socioeconomic status, Access to firearms Current Mental Status:  Self-harm thoughts, Self-harm behaviors Loss Factors:  Decline in physical health, Legal issues Historical Factors:  Prior suicide attempts, Impulsivity Risk Reduction Factors:  Sense of responsibility to family, Living with another person, especially a relative, Positive social support  Total Time spent with patient: 1 hour Principal Problem: MDD (major depressive disorder), recurrent severe, without psychosis (HCC) Diagnosis:   Patient Active Problem List   Diagnosis Date Noted  . MDD (major depressive disorder), recurrent severe, without psychosis (HCC) [F33.2] 06/22/2017  . Major depressive disorder, single episode, severe (HCC) [F32.2] 06/21/2017  . Polysubstance abuse (HCC) [F19.10] 06/21/2017  . Pain management [R52]   . Hypomagnesemia [E83.42]   . Acute pulmonary edema (HCC) [J81.0]   . Peritonitis (HCC) [K65.9]   . Gastrointestinal anastomotic leak [K91.89]   . Pleural effusion on left [J90]   . Postprocedural intraabdominal abscess [T81.49XA] 01/15/2017  . Increased band cell count [D72.825] 01/06/2017  . Generalized edema [R60.1] 01/06/2017  . Abdominal pain [R10.9] 12/30/2016  . Former smoker [W09.811] 08/08/2016  . Morbid obesity with BMI of 50.0-59.9, adult (HCC) [E66.01, Z68.43] 06/25/2016  . OSA (obstructive sleep apnea) [G47.33] 06/25/2016  . Vitamin D deficiency [E55.9] 06/25/2016  . Type 2 diabetes mellitus without complication, without long-term current use of insulin (HCC) [E11.9] 02/29/2016  . Mixed hyperlipidemia [E78.2] 02/29/2016  . Cellulitis and abscess of neck [L03.221, L02.11] 02/29/2016  . Cellulitis [L03.90] 12/14/2013  . Morbid obesity (HCC) [E66.01] 12/14/2013  . Neck abscess [L02.11] 12/14/2013  . Hidradenitis [L73.2] 08/09/2012   Subjective Data:   Albert White  is a 42 y/o M with history of MDD and polysubstance abuse who was admitted with worsening depression, and suicide attempt via overdose of benzodiazepines and multiple other substances. Pt had reported that he took 48 tablets of 0.5mg  Ativan (later he stated he took "60 valiums") Pt was arrested by police while operating his vehicle while intoxicated, and he was brought in for evaluation, but pt denies having recollection of the events after he took the overdose.   Upon initial evaluation, pt shares, "I've been depressed lately, and I had been clean for 2 years. I was having worse cravings and so I relapsed, but then I felt ashamed about what I had done, so that's when I ate sixty valiums." Pt shares that his intention was to complete suicide and he still has SI and remorse that his attempt was not successful.  Pt shares he had relapse on cocaine, benzodiazepines, and cannabis. He shares stressor that his sister has been fighting a terminal illness, and this has triggered worsened depression for the patient as he has lost both is mother and father, and he would not have any family left if his sister passes. He endorses depressed mood, guilty feelings, low energy, poor concentration, poor appetite, and low motivation. He denies symptoms of mania, OCD, and PTSD. However, pt notes he has been having nightmares on most nights which he associates with his previous bariatric surgery.  Discussed with patient about treatment options. He has no previous trials of medications. We discussed starting zoloft for depression and anxiety, and pt was in agreement. We discussed starting prazosin for nightmares, and pt agreed. We discussed starting gabapentin for anxiety, and pt agreed. Pt is open to referral to substance use treatment, and he agreed to discuss options with the SW team. He had  no further questions, comments, or concerns.    Continued Clinical Symptoms:  Alcohol Use Disorder Identification Test Final Score  (AUDIT): 3 The "Alcohol Use Disorders Identification Test", Guidelines for Use in Primary Care, Second Edition.  World Science writerHealth Organization [facility](WHO). Score between 0-7:  no or low risk or alcohol related problems. Score between 8-15:  moderate risk of alcohol related problems. Score between 16-19:  high risk of alcohol related problems. Score 20 or above:  warrants further diagnostic evaluation for alcohol dependence and treatment.   CLINICAL FACTORS:   Severe Anxiety and/or Agitation Depression:   Comorbid alcohol abuse/dependence Alcohol/Substance Abuse/Dependencies More than one psychiatric diagnosis Unstable or Poor Therapeutic Relationship Previous Psychiatric Diagnoses and Treatments   Musculoskeletal: Strength & Muscle Tone: within normal limits Gait & Station: normal Patient leans: N/A  Psychiatric Specialty Exam: Physical Exam  Nursing note and vitals reviewed.   Review of Systems  Constitutional: Negative for chills and fever.  Respiratory: Negative for cough and shortness of breath.   Cardiovascular: Negative for chest pain.  Gastrointestinal: Negative for abdominal pain, heartburn, nausea and vomiting.  Psychiatric/Behavioral: Positive for depression, substance abuse and suicidal ideas. Negative for hallucinations. The patient is nervous/anxious.     Blood pressure 114/71, pulse 65, temperature 97.8 F (36.6 C), temperature source Oral, resp. rate 12, height 5\' 10"  (1.778 m), weight 116.6 kg (257 lb), SpO2 99 %.Body mass index is 36.88 kg/m.  General Appearance: Casual and Fairly Groomed  Eye Contact:  Good  Speech:  Clear and Coherent and Normal Rate  Volume:  Normal  Mood:  Anxious and Depressed  Affect:  Appropriate and Congruent  Thought Process:  Coherent and Goal Directed  Orientation:  Full (Time, Place, and Person)  Thought Content:  Logical  Suicidal Thoughts:  Yes.  without intent/plan  Homicidal Thoughts:  No  Memory:  Immediate;   Fair Recent;    Fair Remote;   Fair  Judgement:  Fair  Insight:  Fair  Psychomotor Activity:  Normal  Concentration:  Concentration: Fair  Recall:  Fis[employer]Fair  Fund of Knowledge:  Fair  Language:  Fair  Akathisia:  No  Handed:    AIMS (if indicated):     Assets:  Communication Skills Leisure Time Physical Health Resilience Social Support  ADL's:  Intact  Cognition:  WNL  Sleep:  Number of Hours: 5.75    COGNITIVE FEATURES THAT CONTRIBUTE TO RISK:  None    SUICIDE RISK:   Minimal: No identifiable suicidal ideation.  Patients presenting with no risk factors but with morbid ruminations; may be classified as minimal risk based on the severity of the depressive symptoms  PLAN OF CARE:   - admit to inpatient level of care  - MDD, recurrent, severe   - Start zoloft 50mg  po qDay  -Anxiety  - Start gabapentin 300mg  po TID   - Start atarax 25mg  q6h prn anxiety  - Nightmares   - Start prazosin 1mg  po qhs  - Insomnia  - Start trazodone 100mg  po qhs prn insomnia  -Encourage participation in groups and therapeutic milieu  -Discharge planning will be ongoing  I certify that inpatient services furnished can reasonably be expected to improve the patient's condition.   Micheal Likenshristopher T Tywon Niday, MD 06/23/2017, 5:24 PM

## 2017-06-23 NOTE — Progress Notes (Signed)
Patient ID: Albert PetrinSteven T Patry, male   DOB: 07/22/1975, 42 y.o.   MRN: [id number]  DAR: Pt. Denies SI/HI and A/V Hallucinations. He reports that his sleep last night was poor, his appetite is fair, his energy level is normal, and his concentration is poor. He rates his depression level is 5/10, his hopelessness level is 4/10, and anxiety level is 6/10. Patient reports a headache this afternoon and received PRN Tylenol which provided relief. Support and encouragement provided to the patient. Scheduled medications administered to patient per physician's orders. Patient is receptive and cooperative. He is seen in the milieu and is attending groups. He presents with depressed mood and flat affect. Q15 minute checks are maintained for safety.

## 2017-06-23 NOTE — Plan of Care (Signed)
  Progressing Safety: Periods of time without injury will increase 06/23/2017 2323 - Progressing by Arrie Aranhurch, Rodrigo Mcgranahan J, RN Note Pt has not harmed self or others tonight.  He denies SI/HI and verbally contracts for safety.  He is low fall risk.

## 2017-06-24 DIAGNOSIS — R450 Nervousness: Secondary | ICD-10-CM

## 2017-06-24 DIAGNOSIS — F1920 Other psychoactive substance dependence, uncomplicated: Secondary | ICD-10-CM

## 2017-06-24 DIAGNOSIS — Z87891 Personal history of nicotine dependence: Secondary | ICD-10-CM

## 2017-06-24 NOTE — Tx Team (Signed)
Interdisciplinary Treatment and Diagnostic Plan Update  06/24/2017 Time of Session: [id number] Albert PetrinSteven T White MRN: [id number]  Principal Diagnosis: MDD (major depressive disorder), recurrent severe, without psychosis (HCC)  Secondary Diagnoses: Principal Problem:   MDD (major depressive disorder), recurrent severe, without psychosis (HCC) Active Problems:   Polysubstance (excluding opioids) dependence, daily use (HCC)   Current Medications:  Current Facility-Administered Medications  Medication Dose Route Frequency Provider Last Rate Last Dose  . acetaminophen (TYLENOL) tablet 650 mg  650 mg Oral Q6H PRN Laveda AbbeParks, Laurie Britton, NP   650 mg at 06/23/17 2109  . alum & mag hydroxide-simeth (MAALOX/MYLANTA) 200-200-20 MG/5ML suspension 30 mL  30 mL Oral Q4H PRN Laveda AbbeParks, Laurie Britton, NP      . gabapentin (NEURONTIN) capsule 300 mg  300 mg Oral TID Armandina StammerNwoko, Agnes I, NP   300 mg at 06/24/17 0754  . hydrOXYzine (ATARAX/VISTARIL) tablet 25 mg  25 mg Oral Q6H PRN Micheal Likensainville, Christopher T, MD   25 mg at 06/24/17 0754  . magnesium hydroxide (MILK OF MAGNESIA) suspension 30 mL  30 mL Oral Daily PRN Laveda AbbeParks, Laurie Britton, NP      . prazosin (MINIPRESS) capsule 1 mg  1 mg Oral QHS Armandina StammerNwoko, Agnes I, NP   1 mg at 06/23/17 2109  . sertraline (ZOLOFT) tablet 50 mg  50 mg Oral Daily Armandina StammerNwoko, Agnes I, NP   50 mg at 06/24/17 0753  . traZODone (DESYREL) tablet 100 mg  100 mg Oral QHS PRN Micheal Likensainville, Christopher T, MD   100 mg at 06/23/17 2110   PTA Medications: Medications Prior to Admission  Medication Sig Dispense Refill Last Dose  . acetaminophen (TYLENOL) 325 MG tablet Take 2 tablets (650 mg total) by mouth every 8 (eight) hours. (Patient not taking: Reported on 06/20/2017) 60 tablet 0 Not Taking at Unknown time  . LORazepam (ATIVAN) 0.5 MG tablet Take 0.5 mg by mouth 2 (two) times daily.   06/20/2017 at Unknown time  . Multiple Vitamin (MULTIVITAMIN WITH MINERALS) TABS tablet Take 1 tablet by mouth daily.   Past Month  at Unknown time    Patient Stressors: Health problems Medication change or noncompliance Substance abuse Traumatic event  Patient Strengths: Average or above average intelligence Motivation for treatment/growth Supportive family/friends  Treatment Modalities: Medication Management, Group therapy, Case management,  1 to 1 session with clinician, Psychoeducation, Recreational therapy.   Physician Treatment Plan for Primary Diagnosis: MDD (major depressive disorder), recurrent severe, without psychosis (HCC) Long Term Goal(s): Improvement in symptoms so as ready for discharge Improvement in symptoms so as ready for discharge   Short Term Goals: Ability to identify changes in lifestyle to reduce recurrence of condition will improve Ability to verbalize feelings will improve Ability to demonstrate self-control will improve Ability to identify and develop effective coping behaviors will improve Compliance with prescribed medications will improve Ability to identify triggers associated with substance abuse/mental health issues will improve  Medication Management: Evaluate patient's response, side effects, and tolerance of medication regimen.  Therapeutic Interventions: 1 to 1 sessions, Unit Group sessions and Medication administration.  Evaluation of Outcomes: Progressing  Physician Treatment Plan for Secondary Diagnosis: Principal Problem:   MDD (major depressive disorder), recurrent severe, without psychosis (HCC) Active Problems:   Polysubstance (excluding opioids) dependence, daily use (HCC)  Long Term Goal(s): Improvement in symptoms so as ready for discharge Improvement in symptoms so as ready for discharge   Short Term Goals: Ability to identify changes in lifestyle to reduce recurrence of condition will improve Ability  to verbalize feelings will improve Ability to demonstrate self-control will improve Ability to identify and develop effective coping behaviors will  improve Compliance with prescribed medications will improve Ability to identify triggers associated with substance abuse/mental health issues will improve     Medication Management: Evaluate patient's response, side effects, and tolerance of medication regimen.  Therapeutic Interventions: 1 to 1 sessions, Unit Group sessions and Medication administration.  Evaluation of Outcomes: Progressing   RN Treatment Plan for Primary Diagnosis: MDD (major depressive disorder), recurrent severe, without psychosis (HCC) Long Term Goal(s): Knowledge of disease and therapeutic regimen to maintain health will improve  Short Term Goals: Ability to remain free from injury will improve, Ability to disclose and discuss suicidal ideas and Ability to identify and develop effective coping behaviors will improve  Medication Management: RN will administer medications as ordered by provider, will assess and evaluate patient's response and provide education to patient for prescribed medication. RN will report any adverse and/or side effects to prescribing provider.  Therapeutic Interventions: 1 on 1 counseling sessions, Psychoeducation, Medication administration, Evaluate responses to treatment, Monitor vital signs and CBGs as ordered, Perform/monitor CIWA, COWS, AIMS and Fall Risk screenings as ordered, Perform wound care treatments as ordered.  Evaluation of Outcomes: Progressing   LCSW Treatment Plan for Primary Diagnosis: MDD (major depressive disorder), recurrent severe, without psychosis (HCC) Long Term Goal(s): Safe transition to appropriate next level of care at discharge, Engage patient in therapeutic group addressing interpersonal concerns.  Short Term Goals: Engage patient in aftercare planning with referrals and resources, Facilitate patient progression through stages of change regarding substance use diagnoses and concerns and Identify triggers associated with mental health/substance abuse  issues  Therapeutic Interventions: Assess for all discharge needs, 1 to 1 time with Social worker, Explore available resources and support systems, Assess for adequacy in community support network, Educate family and significant other(s) on suicide prevention, Complete Psychosocial Assessment, Interpersonal group therapy.  Evaluation of Outcomes: Progressing   Progress in Treatment: Attending groups: Yes. Participating in groups: Minimally, when he attends  Taking medication as prescribed: Yes. Toleration medication: Yes. Family/Significant other contact made: Yes, individual(s) contacted:  pt's wife. Patient understands diagnosis: Yes. Discussing patient identified problems/goals with staff: Yes. Medical problems stabilized or resolved: Yes. Denies suicidal/homicidal ideation: Yes. Issues/concerns per patient self-inventory: No. Other: n/a   New problem(s) identified: No, Describe:  n/a  New Short Term/Long Term Goal(s): detox, medication management for mood stabilization; elimination of SI thoughts; development of comprehensive mental wellness/sobriety plan.   Patient Goal: "to work on Pharmacologist and triggers relating to using drugs."   Discharge Plan or Barriers: CSW assessing. He is planning to return home; requesting follow-up for outpatient therapy and medication management. [facility] made. [facility] pamphlet and Mobile Crisis information provided for additional community support.   Reason for Continuation of Hospitalization: Anxiety Depression Medication stabilization Suicidal ideation Withdrawal symptoms  Estimated Length of Stay: Friday, 06/26/17  Attendees: Patient: 06/24/2017 8:32 AM  Physician: Dr. Jama Flavors MD; Dr. Altamese Hinckley MD 06/24/2017 8:32 AM  Nursing: Foy Guadalajara RN; [facility] RN 06/24/2017 8:32 AM  RN Care Manager: Onnie Boer CM 06/24/2017 8:32 AM  Social Worker: Trula Slade, LCSW 06/24/2017 8:32 AM  Recreational Therapist: x 06/24/2017 8:32 AM   Other: Armandina Stammer NP; Nanine Means NP 06/24/2017 8:32 AM  Other:  06/24/2017 8:32 AM  Other: 06/24/2017 8:32 AM    Scribe for Treatment Team: Ledell Peoples Smart, LCSW 06/24/2017 8:32 AM

## 2017-06-24 NOTE — Progress Notes (Signed)
Adult Psychoeducational Group Note  Date:  06/24/2017 Time:  12:19 AM  Group Topic/Focus:  Wrap-Up Group:   The focus of this group is to help patients review their daily goal of treatment and discuss progress on daily workbooks.  Participation Level:  Minimal  Participation Quality:  Appropriate  Affect:  Appropriate  Cognitive:  Appropriate  Insight: Appropriate  Engagement in Group:  Engaged  Modes of Intervention:  Discussion  Additional Comments: patient rated his day a six. Patient goal was to have positive thoughts for the day. Patient has accomplished his goal.   Tresa EndoKELLY, Shahab Polhamus H 06/24/2017, 12:19 AM

## 2017-06-24 NOTE — Progress Notes (Addendum)
Recreation Therapy Notes  Date: 06/24/17 Time: 0930 Location: [address] Dayroom  Group Topic: Stress Management  Goal Area(s) Addresses:  Patient will verbalize importance of using healthy stress management.  Patient will identify positive emotions associated with healthy stress management.   Behavioral Response: Engaged  Intervention: Stress Management  Activity :  Guided Imagery.  LRT introduced the stress management technique of guided imagery.  LRT read a script that guided patients to envision laying out in the sun on a warm day, enjoying the breeze and sunshine.  Patients were to listen and follow along as script was read.  Education:  Stress Management, Discharge Planning.   Education Outcome: Acknowledges edcuation/In group clarification offered/Needs additional education  Clinical Observations/Feedback: Pt attended group.    Albert RancherMarjette Arturo White, LRT/CTRS         Lillia AbedLindsay, Jordell Outten A 06/24/2017 11:06 AM

## 2017-06-24 NOTE — Progress Notes (Signed)
[facility] MD Progress Note  06/24/2017 3:00 PM Albert White  MRN:  [id number]  Subjective: Albert White reports, "I'm feeling agitated today. I can't seem to get rid of it. I feel stuck in this hospital. I'm taking the medicines. I have not felt a lot of improvement yet.  I'm sleeping well at night. Appetite is good. No side effects".    Objective: Albert White is a 42 y/o M with history of MDD and polysubstance abuse who was admitted with worsening depression, and suicide attempt via overdose of benzodiazepines and multiple other substances. Pt had reported that he took 48 tablets of 0.5mg  Ativan (later he stated he took "60 valiums") Pt was arrested by police while operating his vehicle while intoxicated, and he was brought in for evaluation, but pt denies having recollection of the events after he took the overdose.  Today, 06-24-17, [name] is seen, chart reviewed. Chart findings discussed with the treatment. He is alert, oriented x 3 & aware of situations. He is visible on the unit, attending group sessions. He is complaining of agitations. Blamed it for being in the hospital. He says he feels stuck in here. He is taking his medications. He denies any adverse effects or reactions. He denies any SIHI, AVH, delusional thoughts or paranoia. He does not appear to be responding to any internal stimuli. Staff continue to provide support.  Principal Problem: MDD (major depressive disorder), recurrent severe, without psychosis (HCC)  Diagnosis:   Patient Active Problem List   Diagnosis Date Noted  . Polysubstance (excluding opioids) dependence, daily use (HCC) [F19.20] 06/24/2017    Priority: High  . MDD (major depressive disorder), recurrent severe, without psychosis (HCC) [F33.2] 06/22/2017    Priority: High  . Major depressive disorder, single episode, severe (HCC) [F32.2] 06/21/2017  . Polysubstance abuse (HCC) [F19.10] 06/21/2017  . Pain management [R52]   . Hypomagnesemia [E83.42]   . Acute pulmonary edema  (HCC) [J81.0]   . Peritonitis (HCC) [K65.9]   . Gastrointestinal anastomotic leak [K91.89]   . Pleural effusion on left [J90]   . Postprocedural intraabdominal abscess [T81.49XA] 01/15/2017  . Increased band cell count [D72.825] 01/06/2017  . Generalized edema [R60.1] 01/06/2017  . Former smoker [W09.811] 08/08/2016  . Morbid obesity with BMI of 50.0-59.9, adult (HCC) [E66.01, Z68.43] 06/25/2016  . OSA (obstructive sleep apnea) [G47.33] 06/25/2016  . Vitamin D deficiency [E55.9] 06/25/2016  . Type 2 diabetes mellitus without complication, without long-term current use of insulin (HCC) [E11.9] 02/29/2016  . Mixed hyperlipidemia [E78.2] 02/29/2016  . Cellulitis and abscess of neck [L03.221, L02.11] 02/29/2016  . Cellulitis [L03.90] 12/14/2013  . Morbid obesity (HCC) [E66.01] 12/14/2013  . Neck abscess [L02.11] 12/14/2013  . Hidradenitis [L73.2] 08/09/2012   Total Time spent with patient: 25 minutes  Past Psychiatric History: See H&P.  Past Medical History:  Past Medical History:  Diagnosis Date  . Abscess   . Diabetes mellitus without complication (HCC)   . Hidradenitis suppurativa   . Hyperlipemia   . Morbid obesity (HCC)   . Vertigo     Past Surgical History:  Procedure Laterality Date  . INCISION AND DRAINAGE ABSCESS Right 12/15/2013   Procedure: INCISION AND DRAINAGE Right Posterior neck ABSCESS;  Surgeon: Cherylynn Ridges, MD;  Location: [facility] OR;  Service: General;  Laterality: Right;  . INCISION AND DRAINAGE DEEP NECK ABSCESS     Hidradenitis - 3-4 times at [facility] and [place]  . IR CATHETER TUBE CHANGE  02/18/2017  . IR RADIOLOGIST EVAL & MGMT  02/11/2017  . IR RADIOLOGIST EVAL & MGMT  03/03/2017  . IR THORACENTESIS ASP PLEURAL SPACE W/IMG GUIDE  01/23/2017   Family History:  Family History  Problem Relation Age of Onset  . Arthritis Mother   . Hypertension Mother   . Cancer Other   . Heart disease Other    Family Psychiatric  History: See H&P.  Social History:  Social  History   Substance and Sexual Activity  Alcohol Use No     Social History   Substance and Sexual Activity  Drug Use No    Social History   Socioeconomic History  . Marital status: Married    Spouse name: None  . Number of children: 0  . Years of education: 7th  . Highest education level: None  Social Needs  . Financial resource strain: None  . Food insecurity - worry: Never true  . Food insecurity - inability: Never true  . Transportation needs - medical: None  . Transportation needs - non-medical: None  Occupational History  . Occupation: Holiday representativeConstruction   Tobacco Use  . Smoking status: Former Smoker    Packs/day: 1.00    Last attempt to quit: 11/27/2013    Years since quitting: 3.5  . Smokeless tobacco: Never Used  Substance and Sexual Activity  . Alcohol use: No  . Drug use: No  . Sexual activity: None  Other Topics Concern  . None  Social History Narrative   Drinks about 1 soda a day    Additional Social History:   Sleep: Good  Appetite:  Good  Current Medications: Current Facility-Administered Medications  Medication Dose Route Frequency Provider Last Rate Last Dose  . acetaminophen (TYLENOL) tablet 650 mg  650 mg Oral Q6H PRN Laveda AbbeParks, Laurie Britton, NP   650 mg at 06/23/17 2109  . alum & mag hydroxide-simeth (MAALOX/MYLANTA) 200-200-20 MG/5ML suspension 30 mL  30 mL Oral Q4H PRN Laveda AbbeParks, Laurie Britton, NP      . gabapentin (NEURONTIN) capsule 300 mg  300 mg Oral TID Armandina StammerNwoko, Georgeann Brinkman I, NP   300 mg at 06/24/17 1257  . hydrOXYzine (ATARAX/VISTARIL) tablet 25 mg  25 mg Oral Q6H PRN Micheal Likensainville, Christopher T, MD   25 mg at 06/24/17 0754  . magnesium hydroxide (MILK OF MAGNESIA) suspension 30 mL  30 mL Oral Daily PRN Laveda AbbeParks, Laurie Britton, NP      . prazosin (MINIPRESS) capsule 1 mg  1 mg Oral QHS Armandina StammerNwoko, Zakariyya Helfman I, NP   1 mg at 06/23/17 2109  . sertraline (ZOLOFT) tablet 50 mg  50 mg Oral Daily Armandina StammerNwoko, Chudney Scheffler I, NP   50 mg at 06/24/17 0753  . traZODone (DESYREL) tablet 100  mg  100 mg Oral QHS PRN Micheal Likensainville, Christopher T, MD   100 mg at 06/23/17 2110   Lab Results: No results found for this or any previous visit (from the past 48 hour(s)).  Blood Alcohol level:  Lab Results  Component Value Date   ETH <10 06/20/2017   Metabolic Disorder Labs: Lab Results  Component Value Date   HGBA1C 5.7 (H) 01/30/2017   MPG 116.89 01/30/2017   MPG 200 02/29/2016   No results found for: PROLACTIN Lab Results  Component Value Date   CHOL  09/28/2007    170        ATP III CLASSIFICATION:  <200     mg/dL   Desirable  [id number]  mg/dL   Borderline High  >=213>=240    mg/dL   High   TRIG  116 01/26/2017   HDL 25 (L) 09/28/2007   CHOLHDL 6.8 09/28/2007   VLDL 19 09/28/2007   LDLCALC (H) 09/28/2007    126        Total Cholesterol/HDL:CHD Risk Coronary Heart Disease Risk Table                     Men   Women  1/2 Average Risk   3.4   3.3    Physical Findings: AIMS: Facial and Oral Movements Muscles of Facial Expression: None, normal Lips and Perioral Area: None, normal Jaw: None, normal Tongue: None, normal,Extremity Movements Upper (arms, wrists, hands, fingers): None, normal Lower (legs, knees, ankles, toes): None, normal, Trunk Movements Neck, shoulders, hips: None, normal, Overall Severity Severity of abnormal movements (highest score from questions above): None, normal Incapacitation due to abnormal movements: None, normal Patient's awareness of abnormal movements (rate only patient's report): No Awareness, Dental Status Current problems with teeth and/or dentures?: No Does patient usually wear dentures?: No  CIWA:    COWS:     Musculoskeletal: Strength & Muscle Tone: within normal limits Gait & Station: normal Patient leans: N/A  Psychiatric Specialty Exam: Physical Exam  Nursing note and vitals reviewed.   Review of Systems  Psychiatric/Behavioral: Positive for depression ("Some agitations today") and substance abuse (Hx. Benzodiazepine, Cocaine  & ThC Use). Negative for hallucinations, memory loss and suicidal ideas. The patient is nervous/anxious ("Some agitations today") and has insomnia ("Improving").     Blood pressure 103/66, pulse 98, temperature 97.6 F (36.4 C), temperature source Oral, resp. rate 18, height 5\' 10"  (1.778 m), weight 116.6 kg (257 lb), SpO2 99 %.Body mass index is 36.88 kg/m.  See Md's SRA   Treatment Plan Summary: Daily contact with patient to assess and evaluate symptoms and progress in treatment: Patient is complaining of lingering agitations. We are continuing his inpatient hospitalization.  Will continue today 06/24/2017 plan as below except where it is noted.  Depression.     - Continue Sertraline 50 mg po daily.  Agitation.     - Continue Gabapentin 300 mg po tid.  Anxiety.     - Continue Hydroxyzine 25 mg po prn Q 6 hours.  Nightmares.     - Continue Minipress 1 mg po Q hs.  Insomnia.     - Continue Trazodone 100 mg po.       - Patient continue to participate in the group counseling sessions.     - The discharge disposition is ongoing.  Armandina Stammer, NP, PMHNP, FNP-BC. 06/24/2017, 3:00 PM

## 2017-06-24 NOTE — [facility] Group Notes (Signed)
[facility] Group Notes:  (Nursing/MHT/Case Management/Adjunct)  Date:  06/24/2017  Time:  6:39 PM  Type of Therapy:  Psychoeducational Skills  Participation Level:  Active    Carlisle Caterrika C Shin Lamour 06/24/2017, 6:39 PM

## 2017-06-24 NOTE — [facility] Group Notes (Signed)
LCSW Group Therapy Note 06/24/2017 2:40 PM  Type of Therapy/Topic: Group Therapy: Feelings about Diagnosis  Participation Level: Did Not Attend   Description of Group:  This group will allow patients to explore their thoughts and feelings about diagnoses they have received. Patients will be guided to explore their level of understanding and acceptance of these diagnoses. Facilitator will encourage patients to process their thoughts and feelings about the reactions of others to their diagnosis and will guide patients in identifying ways to discuss their diagnosis with significant others in their lives. This group will be process-oriented, with patients participating in exploration of their own experiences, giving and receiving support, and processing challenge from other group members.  Therapeutic Goals: 1. Patient will demonstrate understanding of diagnosis as evidenced by identifying two or more symptoms of the disorder 2. Patient will be able to express two feelings regarding the diagnosis 3. Patient will demonstrate their ability to communicate their needs through discussion and/or role play  Summary of Patient Progress:   Invited, chose not to attend.    Therapeutic Modalities:  Cognitive Behavioral Therapy Brief Therapy Feelings Identification    Schawn Byas Catalina AntiguaWilliams LCSWA Clinical Social Worker

## 2017-06-24 NOTE — Progress Notes (Signed)
Adult Psychoeducational Group Note  Date:  06/24/2017 Time:  11:29 AM  Group Topic/Focus:  Developing a Wellness Toolbox:   The focus of this group is to help patients develop a "wellness toolbox" with skills and strategies to promote recovery upon discharge.  Participation Level:  Minimal  Participation Quality:  Appropriate  Affect:  Appropriate  Cognitive:  Appropriate  Insight: Improving  Engagement in Group:  Engaged  Modes of Intervention:  Discussion  Additional Comments:  Pt attended group this morning and participated. Today's topic is personal development. Pt shared he would like to work on Pharmacologistcoping skills. Pt stated "I don't have any coping skills right now. Pt was appropriate and pleasant in group. Pt did not much in group.   Albert White A 06/24/2017, 11:29 AM

## 2017-06-24 NOTE — Plan of Care (Signed)
  Progressing Activity: Interest or engagement in activities will improve 06/24/2017 1634 - Progressing by Lenord Fellersopson, Keyston Ardolino Elizabeth, RN

## 2017-06-24 NOTE — Progress Notes (Signed)
Adult Psychoeducational Group Note  Date:  06/24/2017 Time:  9:40 AM  Group Topic/Focus:  Orientation:   The focus of this group is to educate the patient on the purpose and policies of crisis stabilization and provide White format to answer questions about their admission.  The group details unit policies and expectations of patients while admitted.  Participation Level:  Active  Participation Quality:  Appropriate  Affect:  Appropriate  Cognitive:  Alert and Appropriate  Insight: Appropriate and Good  Engagement in Group:  Engaged  Modes of Intervention:  Discussion, Education and Orientation  Additional Comments:  Pt attended orientation/goals group this morning. Staff educated patients on the treatment plan agreement and the rules for the unit. Pt participated and shared his goal with peers. Pt agreed to follow his treatment plan agreement he signed and the rules for the unit.   Albert White 06/24/2017, 9:40 AM

## 2017-06-24 NOTE — Progress Notes (Signed)
D: Pt was in the hallway with visitor upon initial approach.  Pt presents with depressed affect and mood.  He reports he was "agitated" earlier.  When asked why he was agitated, he attributes it to being in a closed area.  He reports he felt "a little better" after the patients went outside today.  His goal is "getting peace of mind" and he is "getting there."  Pt reports he slept well last night.  Pt denies SI/HI, denies hallucinations, reports back pain of 5/10.  Pt has been visible in milieu interacting with peers and staff appropriately.  Pt attended evening group.    A: Introduced self to pt.  Actively listened to pt and offered support and encouragement. Medication administered per order.  PRN medication administered for pain and sleep.  Q15 minute safety checks maintained.  R: Pt is safe on the unit.  Pt is compliant with medications.  Pt verbally contracts for safety.  Will continue to monitor and assess.

## 2017-06-24 NOTE — Progress Notes (Signed)
Patient ID: Albert White, male   DOB: 1976-04-03, 42 y.o.   MRN: [id number]  DAR: Pt. Denies SI/HI and A/V Hallucinations. He reports that his sleep last night was fair. He reports that his Minipress dose did help. However, patient woke up agitated today. He was given PRN Vistaril twice today and this evening reports that he is beginning to feel better. He reports that his appetite is fair, his energy level is normal, and his concentration is poor. He rates his depression level 4/10, his hopelessness level is 5/10, and anxiety level is 7/10. Patient does not report any pain or discomfort at this time. Support and encouragement provided to the patient. Scheduled medications administered to patient per physician's orders. Patient is seen in the milieu and is attending groups. Q15 minute checks are maintained for safety.

## 2017-06-25 ENCOUNTER — Ambulatory Visit: Payer: 59 | Admitting: Registered"

## 2017-06-25 NOTE — Progress Notes (Signed)
[facility]  06/25/2017 12:37 PM Charlyne PetrinSteven T Choquette  MRN:  [id number] Subjective:    Webb SilversmithSteven Molzahn is a 42 y/o M with history of MDD and polysubstance abuse who was admitted with worsening depression, and suicide attempt via overdose of benzodiazepines and multiple other substances. Pt had reported that he took 48 tablets of 0.5mg  Ativan (later he stated he took "60 valiums") Pt was arrested by police while operating his vehicle while intoxicated, and he was brought in for evaluation, but pt denies having recollection of the events after he took the overdose. Pt was started on combination of zoloft for anxiety and depression as well as gabapentin and atarax for anxiety. He was also started on prazosin for nightmares. Pt had some ongoing irritability during his stay, but he had incremental improvement of his presenting symptoms.  Upon evaluation today, pt shares, "I'm good today. My overall attitude and feelings are better." Pt reports improvement of his mood symptoms and he denies SI/HI/AH/VH. He is sleeping well and his appetite is good. He denies nightmares. He is tolerating his current medications without difficulty or side effects. He agrees to continue his current regimen without changes at this time. Pt feels that he would likely be safe to discharge to home tomorrow, and we discussed that we will plan on potential discharge to home tomorrow if pt has ongoing symptom stability. Pt was in agreement with the above plan, and he had no further questions, comments, or concerns.   Principal Problem: MDD (major depressive disorder), recurrent severe, without psychosis (HCC) Diagnosis:   Patient Active Problem List   Diagnosis Date Noted  . Polysubstance (excluding opioids) dependence, daily use (HCC) [F19.20] 06/24/2017  . MDD (major depressive disorder), recurrent severe, without psychosis (HCC) [F33.2] 06/22/2017  . Major depressive disorder, single episode, severe (HCC) [F32.2] 06/21/2017   . Polysubstance abuse (HCC) [F19.10] 06/21/2017  . Pain management [R52]   . Hypomagnesemia [E83.42]   . Acute pulmonary edema (HCC) [J81.0]   . Peritonitis (HCC) [K65.9]   . Gastrointestinal anastomotic leak [K91.89]   . Pleural effusion on left [J90]   . Postprocedural intraabdominal abscess [T81.49XA] 01/15/2017  . Increased band cell count [D72.825] 01/06/2017  . Generalized edema [R60.1] 01/06/2017  . Former smoker [W09.811][Z87.891] 08/08/2016  . Morbid obesity with BMI of 50.0-59.9, adult (HCC) [E66.01, Z68.43] 06/25/2016  . OSA (obstructive sleep apnea) [G47.33] 06/25/2016  . Vitamin D deficiency [E55.9] 06/25/2016  . Type 2 diabetes mellitus without complication, without long-term current use of insulin (HCC) [E11.9] 02/29/2016  . Mixed hyperlipidemia [E78.2] 02/29/2016  . Cellulitis and abscess of neck [L03.221, L02.11] 02/29/2016  . Cellulitis [L03.90] 12/14/2013  . Morbid obesity (HCC) [E66.01] 12/14/2013  . Neck abscess [L02.11] 12/14/2013  . Hidradenitis [L73.2] 08/09/2012   Total Time spent with patient: 30 minutes  Past Psychiatric History: see H&P  Past Medical History:  Past Medical History:  Diagnosis Date  . Abscess   . Diabetes mellitus without complication (HCC)   . Hidradenitis suppurativa   . Hyperlipemia   . Morbid obesity (HCC)   . Vertigo     Past Surgical History:  Procedure Laterality Date  . INCISION AND DRAINAGE ABSCESS Right 12/15/2013   Procedure: INCISION AND DRAINAGE Right Posterior neck ABSCESS;  Surgeon: Cherylynn RidgesJames O [name], MD;  [facility];  Service: General;  Laterality: Right;  . INCISION AND DRAINAGE DEEP NECK ABSCESS     [facility] and [place]  . IR CATHETER TUBE CHANGE  02/18/2017  . IR RADIOLOGIST EVAL & MGMT  02/11/2017  . IR RADIOLOGIST EVAL & MGMT  03/03/2017  . IR THORACENTESIS ASP PLEURAL SPACE W/IMG GUIDE  01/23/2017   Family History:  Family History  Problem Relation Age of Onset  . Arthritis Mother   .  Hypertension Mother   . Cancer Other   . Heart disease Other    Family Psychiatric  History: see H&P Social History:  Social History   Substance and Sexual Activity  Alcohol Use No     Social History   Substance and Sexual Activity  Drug Use No    Social History   Socioeconomic History  . Marital status: Married    Spouse name: None  . Number of children: 0  . Years of education: 7th  . Highest education level: None  Social Needs  . Financial resource strain: None  . Food insecurity - worry: Never true  . Food insecurity - inability: Never true  . Transportation needs - medical: None  . Transportation needs - non-medical: None  Occupational History  . Occupation: Holiday representative   Tobacco Use  . Smoking status: Former Smoker    Packs/day: 1.00    Last attempt to quit: 11/27/2013    Years since quitting: 3.5  . Smokeless tobacco: Never Used  Substance and Sexual Activity  . Alcohol use: No  . Drug use: No  . Sexual activity: None  Other Topics Concern  . None  Social History Narrative   Drinks about 1 soda a day    Additional Social History:                         Sleep: Good  Appetite:  Good  Current Medications: Current Facility-Administered Medications  Medication Dose Route Frequency Provider Last Rate Last Dose  . acetaminophen (TYLENOL) tablet 650 mg  650 mg Oral Q6H PRN Laveda Abbe, NP   650 mg at 06/25/17 0809  . alum & mag hydroxide-simeth (MAALOX/MYLANTA) 200-200-20 MG/5ML suspension 30 mL  30 mL Oral Q4H PRN Laveda Abbe, NP      . gabapentin (NEURONTIN) capsule 300 mg  300 mg Oral TID Armandina Stammer I, NP   300 mg at 06/25/17 1207  . hydrOXYzine (ATARAX/VISTARIL) tablet 25 mg  25 mg Oral Q6H PRN Micheal Likens, MD   25 mg at 06/25/17 1208  . magnesium hydroxide (MILK OF MAGNESIA) suspension 30 mL  30 mL Oral Daily PRN Laveda Abbe, NP      . prazosin (MINIPRESS) capsule 1 mg  1 mg Oral QHS Nwoko, Agnes  I, NP   1 mg at 06/24/17 2140  . sertraline (ZOLOFT) tablet 50 mg  50 mg Oral Daily Armandina Stammer I, NP   50 mg at 06/25/17 0810  . traZODone (DESYREL) tablet 100 mg  100 mg Oral QHS PRN Micheal Likens, MD   100 mg at 06/24/17 2140    Lab Results: No results found for this or any previous visit (from the past 48 hour(s)).  Blood Alcohol level:  Lab Results  Component Value Date   ETH <10 06/20/2017    Metabolic Disorder Labs: Lab Results  Component Value Date   HGBA1C 5.7 (H) 01/30/2017   MPG 116.89 01/30/2017   MPG 200 02/29/2016   No results found for: PROLACTIN Lab Results  Component Value Date   CHOL  09/28/2007    170        ATP  III CLASSIFICATION:  <200     mg/dL   Desirable  161-096  mg/dL   Borderline High  >=045    mg/dL   High   TRIG 409 81/19/1478   HDL 25 (L) 09/28/2007   CHOLHDL 6.8 09/28/2007   VLDL 19 09/28/2007   LDLCALC (H) 09/28/2007    126        Total Cholesterol/HDL:CHD Risk Coronary Heart Disease Risk Table                     Men   Women  1/2 Average Risk   3.4   3.3    Physical Findings: AIMS: Facial and Oral Movements Muscles of Facial Expression: None, normal Lips and Perioral Area: None, normal Jaw: None, normal Tongue: None, normal,Extremity Movements Upper (arms, wrists, hands, fingers): None, normal Lower (legs, knees, ankles, toes): None, normal, Trunk Movements Neck, shoulders, hips: None, normal, Overall Severity Severity of abnormal movements (highest score from questions above): None, normal Incapacitation due to abnormal movements: None, normal Patient's awareness of abnormal movements (rate only patient's report): No Awareness, Dental Status Current problems with teeth and/or dentures?: No Does patient usually wear dentures?: No  CIWA:    COWS:     Musculoskeletal: Strength & Muscle Tone: within normal limits Gait & Station: normal Patient leans: N/A  Psychiatric Specialty Exam: Physical Exam  Nursing note  and vitals reviewed.   Review of Systems  Constitutional: Negative for chills and fever.  Respiratory: Negative for cough and shortness of breath.   Gastrointestinal: Negative for abdominal pain, diarrhea, heartburn and nausea.  Psychiatric/Behavioral: Negative for depression, hallucinations and suicidal ideas.    Blood pressure 103/66, pulse 98, temperature 97.6 F (36.4 C), temperature source Oral, resp. rate 18, height 5\' 10"  (1.778 m), weight 116.6 kg (257 lb), SpO2 99 %.Body mass index is 36.88 kg/m.  General Appearance: Casual and Fairly Groomed  Eye Contact:  Good  Speech:  Clear and Coherent and Normal Rate  Volume:  Normal  Mood:  Euthymic  Affect:  Appropriate and Congruent  Thought Process:  Coherent and Goal Directed  Orientation:  Full (Time, Place, and Person)  Thought Content:  Logical  Suicidal Thoughts:  No  Homicidal Thoughts:  No  Memory:  Immediate;   Fair Recent;   Fair Remote;   Fair  Judgement:  Fair  Insight:  Fair  Psychomotor Activity:  Normal  Concentration:  Concentration: Fair  Recall:  [employer] of Knowledge:  Fair  Language:  Fair  Akathisia:  No  Handed:    AIMS (if indicated):     Assets:  Communication Skills Leisure Time Physical Health Resilience  ADL's:  Intact  Cognition:  WNL  Sleep:  Number of Hours: 6.75    Treatment Plan Summary: Daily contact with patient to assess and evaluate symptoms and progress in treatment and Medication management. Pt reports improvement of his presenting symptoms, and he is tolerating his medications without side effects. He agrees to continue his current regimen with plan for likely discharge to outpatient level of care tomorrow.  - Continue inpatient hospitalization  - MDD, recurrent, severe             - Continue zoloft 50mg  po qDay  -Anxiety             - Continue gabapentin 300mg  po TID             - Continue atarax 25mg  q6h prn anxiety  - Nightmares             -  Continue prazosin  1mg  po qhs  - Insomnia             - Continue trazodone 100mg  po qhs prn insomnia  -Encourage participation in groups and therapeutic milieu  -Discharge planning will be ongoing   Micheal Likens, MD 06/25/2017, 12:37 PM

## 2017-06-25 NOTE — Progress Notes (Signed)
D   Pt is pleasant on approach and cooperative   He interacts well with staff and peers and his behavior is appropriate    He endorses depression and some anxiety    discussed how bariatric surgery can cause chemicals in our bodies to be changed and some times can cause depression A    Verbal support given   Medications administered and effectiveness monitored     Q 15 min checks R   Pt is safe at present time

## 2017-06-25 NOTE — [facility] Group Notes (Signed)
LCSW Group Therapy Note  06/25/2017 1:15pm  Type of Therapy/Topic:  Group Therapy:  Emotion Regulation  Participation Level:  Active   Description of Group:   The purpose of this group is to assist patients in learning to regulate negative emotions and experience positive emotions. Patients will be guided to discuss ways in which they have been vulnerable to their negative emotions. These vulnerabilities will be juxtaposed with experiences of positive emotions or situations, and patients will be challenged to use positive emotions to combat negative ones. Special emphasis will be placed on coping with negative emotions in conflict situations, and patients will process healthy conflict resolution skills.  Therapeutic Goals: 1. Patient will identify two positive emotions or experiences to reflect on in order to balance out negative emotions 2. Patient will label two or more emotions that they find the most difficult to experience 3. Patient will demonstrate positive conflict resolution skills through discussion and/or role plays  Summary of Patient Progress:  Albert White was attentive and engaged during today's processing group. He shared that his most challenging emotion to regulate is depression/grief. "I feel so empty, and tired all the time. I lose sleep, which makes it worse." He shared that he plans to work on his communication when he discharges from the hospital. "I have a hard time getting out my feelings but I'm going to make more of an effort to talk to my wife about what I'm feeling." He continues to show progress in the group setting with improving insight.   Therapeutic Modalities:   Cognitive Behavioral Therapy Feelings Identification Dialectical Behavioral Therapy   Ledell PeoplesHeather N Smart, LCSW 06/25/2017 2:44 PM

## 2017-06-25 NOTE — Progress Notes (Signed)
Patient ID: Albert White, male   DOB: 03-24-1976, 42 y.o.   MRN: [id number] D: Patient is calm/cooperative with care, denies SI/HI/AVH, and has been visible in the milieu through entire shift so far, and has been interactive with staff and peers.  Pt reports that his sleep quality last night was good, reports his appetite as fair, reports his concentration level as good, rates his depression as 3 (10 being the worst), rates his hopelessness level as 2 (10 being the worst), rates his anxiety as 4 (10 being the worst).  Pt's goal for today was "to have a good day".    A: Pt was educated on all of his medications, and has been given all meds so far, as scheduled, and is being maintained on Q15 minute safety checks.  Pt was also given Atarax 25mg  at 1208pm for complaints of anxiety.  R: Pt denies any current concerns. Will continue to monitor.

## 2017-06-26 MED ORDER — PRAZOSIN HCL 1 MG PO CAPS: 1.0000 mg | ORAL_CAPSULE | Freq: Every day | ORAL | 0 refills | Status: DC

## 2017-06-26 MED ORDER — SERTRALINE HCL 50 MG PO TABS: 50.0000 mg | ORAL_TABLET | Freq: Every day | ORAL | 0 refills | Status: DC

## 2017-06-26 MED ORDER — TRAZODONE HCL 100 MG PO TABS: 100.0000 mg | ORAL_TABLET | Freq: Every evening | ORAL | 0 refills | Status: DC | PRN

## 2017-06-26 MED ORDER — GABAPENTIN 300 MG PO CAPS: 300.0000 mg | ORAL_CAPSULE | Freq: Three times a day (TID) | ORAL | 0 refills | Status: DC

## 2017-06-26 MED ORDER — HYDROXYZINE HCL 25 MG PO TABS: 25.0000 mg | ORAL_TABLET | Freq: Four times a day (QID) | ORAL | 0 refills | Status: DC | PRN

## 2017-06-26 MED ORDER — ACETAMINOPHEN 325 MG PO TABS: 650.0000 mg | ORAL_TABLET | Freq: Three times a day (TID) | ORAL | 0 refills | Status: DC

## 2017-06-26 NOTE — Progress Notes (Signed)
  [facility] :  Will you be returning to the same living situation after discharge:  Yes,  home with wife. At discharge, do you have transportation home?: Yes,  wife Do you have the ability to pay for your medications: Yes,  [employer]  Release of information consent forms completed and submitted to medical records by CSW.  Patient to Follow up at: Follow-up Information    [facility] 07[facility]07/2017.   Why:  Hospital follow-up/medication management appt on Tuesday, 2/19 at 11:45AM with Tracie. Therapy appt on Thursday, 2/28 at 1:00PM with Alissa Branch. Please bring photo ID and insurance card to your first appt. Thank you!  Contact information: [address] [place] [address] [phone]           Next level of care provider has access to [facility] Link:no  Safety Planning and Suicide Prevention discussed: Yes,  SPE completed with pt and his wife. SPI pamphlet and Mobile Crisis information provided to pt.   Have you used any form of tobacco in the last 30 days? (Cigarettes, Smokeless Tobacco, Cigars, and/or Pipes): No  Has patient been referred to the Quitline?: N/A patient is not a smoker  Patient has been referred for addiction treatment: Yes  Pulte HomesHeather N Smart, LCSW 06/26/2017, 9:12 AM

## 2017-06-26 NOTE — Plan of Care (Signed)
  Completed/Met Activity: Interest or engagement in activities will improve 06/26/2017 0858 - Completed/Met by Joice Lofts, RN Sleeping patterns will improve 06/26/2017 0858 - Completed/Met by Joice Lofts, RN Education: Knowledge of [place] Education information/materials will improve 06/26/2017 [phone] - Completed/Met by Joice Lofts, RN Emotional status will improve 06/26/2017 0858 - Completed/Met by Joice Lofts, RN Mental status will improve 06/26/2017 0858 - Completed/Met by Joice Lofts, RN Verbalization of understanding the information provided will improve 06/26/2017 0858 - Completed/Met by Joice Lofts, RN Coping: Ability to verbalize frustrations and anger appropriately will improve 06/26/2017 0858 - Completed/Met by Joice Lofts, RN Ability to demonstrate self-control will improve 06/26/2017 0858 - Completed/Met by Joice Lofts, [place] Behavior/Discharge Planning: Identification of resources available to assist in meeting health care needs will improve 06/26/2017 0858 - Completed/Met by Joice Lofts, RN Compliance with treatment plan for underlying cause of condition will improve 06/26/2017 0858 - Completed/Met by Joice Lofts, RN Physical Regulation: Ability to maintain clinical measurements within normal limits will improve 06/26/2017 0858 - Completed/Met by Joice Lofts, RN Safety: Periods of time without injury will increase 06/26/2017 0858 - Completed/Met by Joice Lofts, [place] Behavior/Discharge Planning: Identification of resources available to assist in meeting health care needs will improve 06/26/2017 0858 - Completed/Met by Joice Lofts, RN Self-Concept: Ability to disclose and discuss suicidal ideas will improve 06/26/2017 0858 - Completed/Met by Joice Lofts, RN Ability to verbalize positive feelings about self will improve 06/26/2017 0858 - Completed/Met by Joice Lofts, RN Education: Knowledge of disease or condition will improve 06/26/2017 0858 - Completed/Met by Joice Lofts, RN Understanding of discharge needs will improve 06/26/2017 0858 - Completed/Met by Joice Lofts, RN Safety: Ability to remain free from injury will improve 06/26/2017 0858 - Completed/Met by Joice Lofts, RN Spiritual Needs Ability to function at adequate level 06/26/2017 0858 - Completed/Met by Joice Lofts, RN

## 2017-06-26 NOTE — Progress Notes (Addendum)
Discharge note:  Patient discharged home per MD order.  Reviewed AVS/transition record with patient and he indicated understanding.  Patient understands his follow up appointment.  He will follow up with [facility]. He denies any thoughts of self harm.  He received all personal belongings from unit and locker.  Patient left ambulatory with his wife.

## 2017-06-26 NOTE — Discharge Summary (Signed)
Physician Discharge Summary Note  Patient:  Albert White is an 42 y.o., male MRN:  [id number] DOB:  [birth year] Patient phone:  [phone] (home)   Patient address:   [address] [place] [address],   Total Time spent with patient: Greater than 30 minutes  Date of Admission:  06/22/2017 Date of Discharge: 06-26-17  Reason for Admission: Worsening symptoms of depression triggering suicide attempt by overdose.  Principal Problem: MDD (major depressive disorder), recurrent severe, without psychosis [facility])  Discharge Diagnoses: Patient Active Problem List   Diagnosis Date Noted  . Polysubstance (excluding opioids) dependence, daily use (HCC) [F19.20] 06/24/2017    Priority: High  . MDD (major depressive disorder), recurrent severe, without psychosis (HCC) [F33.2] 06/22/2017    Priority: High  . Major depressive disorder, single episode, severe (HCC) [F32.2] 06/21/2017  . Polysubstance abuse (HCC) [F19.10] 06/21/2017  . Pain management [R52]   . Hypomagnesemia [E83.42]   . Acute pulmonary edema (HCC) [J81.0]   . Peritonitis (HCC) [K65.9]   . Gastrointestinal anastomotic leak [K91.89]   . Pleural effusion on left [J90]   . Postprocedural intraabdominal abscess [T81.49XA] 01/15/2017  . Increased band cell count [D72.825] 01/06/2017  . Generalized edema [R60.1] 01/06/2017  . Former smoker [O13.086] 08/08/2016  . Morbid obesity with BMI of 50.0-59.9, adult (HCC) [E66.01, Z68.43] 06/25/2016  . OSA (obstructive sleep apnea) [G47.33] 06/25/2016  . Vitamin D deficiency [E55.9] 06/25/2016  . Type 2 diabetes mellitus without complication, without long-term current use of insulin (HCC) [E11.9] 02/29/2016  . Mixed hyperlipidemia [E78.2] 02/29/2016  . Cellulitis and abscess of neck [L03.221, L02.11] 02/29/2016  . Cellulitis [L03.90] 12/14/2013  . Morbid obesity (HCC) [E66.01] 12/14/2013  . Neck abscess [L02.11] 12/14/2013  . Hidradenitis [L73.2] 08/09/2012   Past Psychiatric  History: Hx. Polysubstance use disorder, MDD  Past Medical History:  Past Medical History:  Diagnosis Date  . Abscess   . Diabetes mellitus without complication (HCC)   . Hidradenitis suppurativa   . Hyperlipemia   . Morbid obesity (HCC)   . Vertigo     Past Surgical History:  Procedure Laterality Date  . INCISION AND DRAINAGE ABSCESS Right 12/15/2013   Procedure: INCISION AND DRAINAGE Right Posterior neck ABSCESS;  Surgeon: Cherylynn Ridges, MD;  Location: [facility] OR;  Service: General;  Laterality: Right;  . INCISION AND DRAINAGE DEEP NECK ABSCESS     Hidradenitis - 3-4 times at [facility] and [place]  . IR CATHETER TUBE CHANGE  02/18/2017  . IR RADIOLOGIST EVAL & MGMT  02/11/2017  . IR RADIOLOGIST EVAL & MGMT  03/03/2017  . IR THORACENTESIS ASP PLEURAL SPACE W/IMG GUIDE  01/23/2017   Family History:  Family History  Problem Relation Age of Onset  . Arthritis Mother   . Hypertension Mother   . Cancer Other   . Heart disease Other    Family Psychiatric  History: See H&P.  Social History:  Social History   Substance and Sexual Activity  Alcohol Use No     Social History   Substance and Sexual Activity  Drug Use No    Social History   Socioeconomic History  . Marital status: Married    Spouse name: None  . Number of children: 0  . Years of education: 7th  . Highest education level: None  Social Needs  . Financial resource strain: None  . Food insecurity - worry: Never true  . Food insecurity - inability: Never true  . Transportation needs - medical: None  . Transportation  needs - non-medical: None  Occupational History  . Occupation: Holiday representative   Tobacco Use  . Smoking status: Former Smoker    Packs/day: 1.00    Last attempt to quit: 11/27/2013    Years since quitting: 3.5  . Smokeless tobacco: Never Used  Substance and Sexual Activity  . Alcohol use: No  . Drug use: No  . Sexual activity: None  Other Topics Concern  . None  Social History Narrative   Drinks about  1 soda a day    Hospital Course: (Per Md's SRA): Albert White is a 42 y/o M with history of MDD and polysubstance abuse who was admitted with worsening depression, and suicide attempt via overdose of benzodiazepines and multiple other substances. Pt had reported that he took 48 tablets of 0.5mg  Ativan (later he stated he took "60 valiums") Pt was arrested by police while operating his vehicle while intoxicated, and he was brought in for evaluation, but pt denies having recollection of the events after he took the overdose.Pt was started on combination of zoloft for anxiety and depression as well as gabapentin and atarax for anxiety. He was also started on prazosin for nightmares. Pt had some ongoing irritability during his stay, but he had incremental improvement of his presenting symptoms.  After the admission assessment, Albert White was started on the medication regimen for his presenting symptoms. He received & was discharged on; Sertraline 50 mg for depression, Hydroxyzine 25 mg prn for anxiety, Gabapentin 300 mg for agitation, Minipress 1 mg for nightmares & Trazodone 100 mg for insomnia. He was enrolled & participated in the group counseling sessions being offered & held on this unit. He learned coping skills. He received no other medication regimen as he presented no other medical issues. He tolerated his treatment regimen without any adverse effects or reactions reported.  Albert White symptoms responded well to his treatment regimen. This is evidenced by his reports of improved mood & absence of suicidal ideations. He is currently mentally & medically stable to be discharge to his home with wife. Uponhis discharge evaluation today, pt shares, "I'm doing a lot better." He notes that his mood has improved during his admission. He denies SI/HI/AH/VH. He is sleeping well. His appetite is good. He is in agreement to continue his current regimen after discharge. He denies any physical complaints or side  effects. He is in agreement to follow up at [facility]. He shares that he also plans to attend NA meetings, starting with a meeting tonight, and he hopes to "build my support network." He was able to engage in safety planning including plan to return to [facility] or contact emergency services if he feels unable to maintain his own safety or the safety of others. Pt had no further questions, comments, or concerns.  Pace left [facility] with all personal belongings in no apparent distress. Transportation per wife.  Physical Findings: AIMS: Facial and Oral Movements Muscles of Facial Expression: None, normal Lips and Perioral Area: None, normal Jaw: None, normal Tongue: None, normal,Extremity Movements Upper (arms, wrists, hands, fingers): None, normal Lower (legs, knees, ankles, toes): None, normal, Trunk Movements Neck, shoulders, hips: None, normal, Overall Severity Severity of abnormal movements (highest score from questions above): None, normal Incapacitation due to abnormal movements: None, normal Patient's awareness of abnormal movements (rate only patient's report): No Awareness, Dental Status Current problems with teeth and/or dentures?: No Does patient usually wear dentures?: No  CIWA:    COWS:     Musculoskeletal: Strength & Muscle Tone:  within normal limits Gait & Station: normal Patient leans: N/A  Psychiatric Specialty Exam: Physical Exam  Constitutional: He appears well-developed.  HENT:  Head: Normocephalic.  Eyes: Pupils are equal, round, and reactive to light.  Neck: Normal range of motion.  Cardiovascular: Normal rate.  Respiratory: Effort normal.  GI: Soft.  Genitourinary:  Genitourinary Comments: Deferred  Musculoskeletal: Normal range of motion.  Neurological: He is alert.  Skin: Skin is warm.    Review of Systems  Constitutional: Negative.   HENT: Negative.   Eyes: Negative.   Respiratory: Negative.   Cardiovascular: Negative.   Gastrointestinal:  Negative.   Genitourinary: Negative.   Musculoskeletal: Negative.   Skin: Negative.   Neurological: Negative.   Endo/Heme/Allergies: Negative.   Psychiatric/Behavioral: Positive for depression (Stable) and substance abuse (Aklcohol, benzo & Cocaine use disorder). Negative for hallucinations, memory loss and suicidal ideas. The patient has insomnia (Stable). The patient is not nervous/anxious.     Blood pressure 130/83, pulse 93, temperature 97.6 F (36.4 C), temperature source Oral, resp. rate 18, height 5\' 10"  (1.778 m), weight 116.6 kg (257 lb), SpO2 99 %.Body mass index is 36.88 kg/m.  See Md's SRA   Have you used any form of tobacco in the last 30 days? (Cigarettes, Smokeless Tobacco, Cigars, and/or Pipes): No  Has this patient used any form of tobacco in the last 30 days? (Cigarettes, Smokeless Tobacco, Cigars, and/or Pipes): N/A  Blood Alcohol level:  Lab Results  Component Value Date   ETH <10 06/20/2017   Metabolic Disorder Labs:  Lab Results  Component Value Date   HGBA1C 5.7 (H) 01/30/2017   MPG 116.89 01/30/2017   MPG 200 02/29/2016   No results found for: PROLACTIN Lab Results  Component Value Date   CHOL  09/28/2007    170        ATP III CLASSIFICATION:  <200     mg/dL   Desirable  161-096  mg/dL   Borderline High  >=045    mg/dL   High   TRIG 409 81/19/1478   HDL 25 (L) 09/28/2007   CHOLHDL 6.8 09/28/2007   VLDL 19 09/28/2007   LDLCALC (H) 09/28/2007    126        Total Cholesterol/HDL:CHD Risk Coronary Heart Disease Risk Table                     Men   Women  1/2 Average Risk   3.4   3.3    See Psychiatric Specialty Exam and Suicide Risk Assessment completed by Attending Physician prior to discharge.  Discharge destination:  Home  Is patient on multiple antipsychotic therapies at discharge:  No   Has Patient had three or more failed trials of antipsychotic monotherapy by history:  No  Recommended Plan for Multiple Antipsychotic  Therapies: NA  Allergies as of 06/26/2017   No Known Allergies     Medication List    STOP taking these medications   LORazepam 0.5 MG tablet Commonly known as:  ATIVAN   multivitamin with minerals Tabs tablet     TAKE these medications     Indication  acetaminophen 325 MG tablet Commonly known as:  TYLENOL Take 2 tablets (650 mg total) by mouth every 8 (eight) hours. For pain/fever What changed:  additional instructions  Indication:  Fever, Pain   gabapentin 300 MG capsule Commonly known as:  NEURONTIN Take 1 capsule (300 mg total) by mouth 3 (three) times daily. For agitation  Indication:  Agitation, Alcohol Withdrawal Syndrome   hydrOXYzine 25 MG tablet Commonly known as:  ATARAX/VISTARIL Take 1 tablet (25 mg total) by mouth every 6 (six) hours as needed for anxiety.  Indication:  Feeling Anxious   prazosin 1 MG capsule Commonly known as:  MINIPRESS Take 1 capsule (1 mg total) by mouth at bedtime. For nigthmares  Indication:  Nightmares   sertraline 50 MG tablet Commonly known as:  ZOLOFT Take 1 tablet (50 mg total) by mouth daily. For depression Start taking on:  06/27/2017  Indication:  Major Depressive Disorder   traZODone 100 MG tablet Commonly known as:  DESYREL Take 1 tablet (100 mg total) by mouth at bedtime as needed for sleep.  Indication:  Trouble Sleeping      Follow-up Information    [facility] Follow up on 07/07/2017.   Why:  Hospital follow-up/medication management appt on Tuesday, 2/19 at 11:45AM with Tracie. Therapy appt on Thursday, 2/28 at 1:00PM with Alissa Branch. Please bring photo ID and insurance card to your first appt. Thank you!  Contact information: [address] [place] [address] [phone]          Follow-up recommendations: Activity:  As tolerated Diet: As recommended by your primary care doctor. Keep all scheduled follow-up appointments as recommended.   Comments: Patient is instructed  prior to discharge to: Take all medications as prescribed by his/her mental healthcare provider. Report any adverse effects and or reactions from the medicines to his/her outpatient provider promptly. Patient has been instructed & cautioned: To not engage in alcohol and or illegal drug use while on prescription medicines. In the event of worsening symptoms, patient is instructed to call the crisis hotline, 911 and or go to the nearest ED for appropriate evaluation and treatment of symptoms. To follow-up with his/her primary care provider for your other medical issues, concerns and or health care needs.   Signed: Armandina StammerAgnes Nwoko, NP, PMHNP, FNP-BC 06/26/2017, 9:42 AM   Patient seen, Suicide Assessment Completed.  Disposition Plan Reviewed

## 2017-06-26 NOTE — Progress Notes (Signed)
Recreation Therapy Notes  Date: 06/26/17 Time: 0930 Location: 300 Hall Dayroom  Group Topic: Stress Management  Goal Area(s) Addresses:  Patient will verbalize importance of using healthy stress management.  Patient will identify positive emotions associated with healthy stress management.   Behavioral Response: Engaged  Intervention: Stress Management  Activity :  Progressive Muscle Relaxation.  LRT introduced the stress management technique of progressive muscle relaxation.  Patients were to follow along as LRT read script to tense each muscle group and then relax them separately.   Education: Stress Management, Discharge Planning.   Education Outcome: Acknowledges edcuation/In group clarification offered/Needs additional education  Clinical Observations/Feedback: Pt attended group.     Caroll RancherMarjette Timoth Schara, LRT/CTRS          Caroll RancherLindsay, Isamar Nazir A 06/26/2017 12:18 PM

## 2017-06-26 NOTE — [facility] Suicide Risk Assessment (Signed)
[facility] Discharge Suicide Risk Assessment   Principal Problem: MDD (major depressive disorder), recurrent severe, without psychosis (HCC) Discharge Diagnoses:  Patient Active Problem List   Diagnosis Date Noted  . Polysubstance (excluding opioids) dependence, daily use (HCC) [F19.20] 06/24/2017  . MDD (major depressive disorder), recurrent severe, without psychosis (HCC) [F33.2] 06/22/2017  . Major depressive disorder, single episode, severe (HCC) [F32.2] 06/21/2017  . Polysubstance abuse (HCC) [F19.10] 06/21/2017  . Pain management [R52]   . Hypomagnesemia [E83.42]   . Acute pulmonary edema (HCC) [J81.0]   . Peritonitis (HCC) [K65.9]   . Gastrointestinal anastomotic leak [K91.89]   . Pleural effusion on left [J90]   . Postprocedural intraabdominal abscess [T81.49XA] 01/15/2017  . Increased band cell count [D72.825] 01/06/2017  . Generalized edema [R60.1] 01/06/2017  . Former smoker [U04.540] 08/08/2016  . Morbid obesity with BMI of 50.0-59.9, adult (HCC) [E66.01, Z68.43] 06/25/2016  . OSA (obstructive sleep apnea) [G47.33] 06/25/2016  . Vitamin D deficiency [E55.9] 06/25/2016  . Type 2 diabetes mellitus without complication, without long-term current use of insulin (HCC) [E11.9] 02/29/2016  . Mixed hyperlipidemia [E78.2] 02/29/2016  . Cellulitis and abscess of neck [L03.221, L02.11] 02/29/2016  . Cellulitis [L03.90] 12/14/2013  . Morbid obesity (HCC) [E66.01] 12/14/2013  . Neck abscess [L02.11] 12/14/2013  . Hidradenitis [L73.2] 08/09/2012    Total Time spent with patient: 30 minutes  Musculoskeletal: Strength & Muscle Tone: within normal limits Gait & Station: normal Patient leans: N/A  Psychiatric Specialty Exam: Review of Systems  Constitutional: Negative for chills and fever.  Respiratory: Negative for cough and shortness of breath.   Cardiovascular: Negative for chest pain.  Gastrointestinal: Negative for abdominal pain, diarrhea, heartburn, nausea and vomiting.   Psychiatric/Behavioral: Negative for depression, hallucinations and suicidal ideas. The patient is not nervous/anxious.     Blood pressure 130/83, pulse 93, temperature 97.6 F (36.4 C), temperature source Oral, resp. rate 18, height 5\' 10"  (1.778 m), weight 116.6 kg (257 lb), SpO2 99 %.Body mass index is 36.88 kg/m.  General Appearance: Casual and Fairly Groomed  Patent attorney::  Good  Speech:  Clear and Coherent and Normal Rate  Volume:  Normal  Mood:  Euthymic  Affect:  Appropriate and Congruent  Thought Process:  Coherent and Goal Directed  Orientation:  Full (Time, Place, and Person)  Thought Content:  Logical  Suicidal Thoughts:  No  Homicidal Thoughts:  No  Memory:  Immediate;   Fair Recent;   Fair Remote;   Fair  Judgement:  Fair  Insight:  Fair  Psychomotor Activity:  Normal  Concentration:  Poor  Recall:  [employer] of Knowledge:Fair  Language: Fair  Akathisia:  No  Handed:    AIMS (if indicated):     Assets:  Manufacturing systems engineer Physical Health Resilience Social Support  Sleep:  Number of Hours: 5  Cognition: WNL  ADL's:  Intact   Mental Status Per Nursing Assessment::   On Admission:  Self-harm thoughts, Self-harm behaviors  Demographic Factors:  Male, Caucasian and Low socioeconomic status  Loss Factors: NA  Historical Factors: Impulsivity  Risk Reduction Factors:   Positive social support, Positive therapeutic relationship and Positive coping skills or problem solving skills  Continued Clinical Symptoms:  Depression:   Impulsivity  Cognitive Features That Contribute To Risk:  None    Suicide Risk:  Minimal: No identifiable suicidal ideation.  Patients presenting with no risk factors but with morbid ruminations; may be classified as minimal risk based on the severity of the depressive symptoms  Follow-up Information    [facility] Follow up on 07/07/2017.   Why:  Hospital follow-up/medication management appt on Tuesday,  2/19 at 11:45AM with Tracie. Therapy appt on Thursday, 2/28 at 1:00PM with Alissa Branch. Please bring photo ID and insurance card to your first appt. Thank you!  Contact information: [address] [place] [address] [phone]          Subjective Data: Albert White is a 42 y/o M with history of MDD and polysubstance abuse who was admitted with worsening depression, and suicide attempt via overdose of benzodiazepines and multiple other substances. Pt had reported that he took 48 tablets of 0.5mg  Ativan (later he stated he took "60 valiums") Pt was arrested by police while operating his vehicle while intoxicated, and he was brought in for evaluation, but pt denies having recollection of the events after he took the overdose. Pt was started on combination of zoloft for anxiety and depression as well as gabapentin and atarax for anxiety. He was also started on prazosin for nightmares. Pt had some ongoing irritability during his stay, but he had incremental improvement of his presenting symptoms.  Upon evaluation today, pt shares, "I'm doing a lot better." He notes that his mood has improved during his admission. He denies SI/HI/AH/VH. He is sleeping well. His appetite is good. He is in agreement to continue his current regimen after discharge. He denies any physical complaints or side effects. He is in agreement to follow up at [facility]. He shares that he also plans to attend NA meetings, starting with a meeting tonight, and he hopes to "build my support network." He was able to engage in safety planning including plan to [facility] or contact emergency services if he feels unable to maintain his own safety or the safety of others. Pt had no further questions, comments, or concerns.    Plan Of Care/Follow-up recommendations:   - Discharge to outpatient level of care  - MDD, recurrent, severe - Continue zoloft 50mg  po qDay  -Anxiety -  Continue gabapentin 300mg  po TID - Continue atarax 25mg  q6h prn anxiety  - Nightmares - Continue prazosin 1mg  po qhs  - Insomnia - Continue trazodone 100mg  po qhs prn insomnia  Activity:  as tolerated Diet:  normal Tests:  NA Other:  see above for DC plan  Micheal Likenshristopher T Cattaleya Wien, MD 06/26/2017, 10:05 AM

## 2017-06-26 NOTE — Progress Notes (Signed)
[facility] Group Notes:  (Nursing/MHT/Case Management/Adjunct)  Date:  06/25/2017 Time:  2045  Type of Therapy:  wrap up group  Participation Level:  Active  Participation Quality:  Appropriate, Attentive, Sharing and Supportive  Affect:  Appropriate  Cognitive:  Appropriate  Insight:  Good  Engagement in Group:  Engaged  Modes of Intervention:  Clarification, Education and Support  Summary of Progress/Problems:  Marcille BuffyMcNeil, Bonnee Zertuche S 06/26/2017, 12:28 AM

## 2017-09-24 DIAGNOSIS — Z125 Encounter for screening for malignant neoplasm of prostate: Secondary | ICD-10-CM | POA: Diagnosis not present

## 2017-09-24 DIAGNOSIS — Z0000 Encounter for general adult medical examination without abnormal findings: Secondary | ICD-10-CM | POA: Diagnosis not present

## 2017-09-24 DIAGNOSIS — R82998 Other abnormal findings in urine: Secondary | ICD-10-CM | POA: Diagnosis not present

## 2017-09-30 DIAGNOSIS — Z0000 Encounter for general adult medical examination without abnormal findings: Secondary | ICD-10-CM | POA: Diagnosis not present

## 2017-09-30 DIAGNOSIS — H6123 Impacted cerumen, bilateral: Secondary | ICD-10-CM | POA: Diagnosis not present

## 2017-09-30 DIAGNOSIS — E1169 Type 2 diabetes mellitus with other specified complication: Secondary | ICD-10-CM | POA: Diagnosis not present

## 2017-09-30 DIAGNOSIS — M25562 Pain in left knee: Secondary | ICD-10-CM | POA: Diagnosis not present

## 2017-09-30 DIAGNOSIS — Z1389 Encounter for screening for other disorder: Secondary | ICD-10-CM | POA: Diagnosis not present

## 2017-10-07 ENCOUNTER — Ambulatory Visit (INDEPENDENT_AMBULATORY_CARE_PROVIDER_SITE_OTHER): Payer: Self-pay | Admitting: Orthopaedic Surgery

## 2017-11-02 ENCOUNTER — Ambulatory Visit (INDEPENDENT_AMBULATORY_CARE_PROVIDER_SITE_OTHER): Payer: Self-pay | Admitting: Orthopaedic Surgery

## 2017-12-03 ENCOUNTER — Ambulatory Visit (INDEPENDENT_AMBULATORY_CARE_PROVIDER_SITE_OTHER): Payer: Self-pay | Admitting: Orthopaedic Surgery

## 2017-12-19 ENCOUNTER — Encounter (HOSPITAL_COMMUNITY): Payer: Self-pay | Admitting: Student

## 2018-01-19 DIAGNOSIS — K912 Postsurgical malabsorption, not elsewhere classified: Secondary | ICD-10-CM | POA: Diagnosis not present

## 2018-01-19 DIAGNOSIS — Z9884 Bariatric surgery status: Secondary | ICD-10-CM | POA: Diagnosis not present

## 2018-01-19 DIAGNOSIS — R1013 Epigastric pain: Secondary | ICD-10-CM | POA: Diagnosis not present

## 2018-01-19 DIAGNOSIS — E669 Obesity, unspecified: Secondary | ICD-10-CM | POA: Diagnosis not present

## 2018-02-22 IMAGING — CT CT ABD-PELV W/ CM
3 of 4 series · 12 of 36 positions shown, 18 images · IV contrast ([ID] ISOVUE 300)
Comparison: 01/31/2017 as well as other prior CT studies.

CLINICAL DATA: Status post percutaneous drainage catheter placement
x 3 to treat intraabdominal abscesses after prior gastric bypass
surgery.

EXAM:
CT ABDOMEN AND PELVIS WITH CONTRAST
TECHNIQUE: Multidetector CT imaging of the abdomen and pelvis was performed
using the standard protocol following bolus administration of
intravenous contrast.
CONTRAST:  125 mL 3sovue-8TT IV

[Series 3: abd/pelvis with · axial · 0.92mm/px · z∈[-483,-83]mm · 8 of 104 slices shown, 13 images]
[im 12/104  soft-tissue]
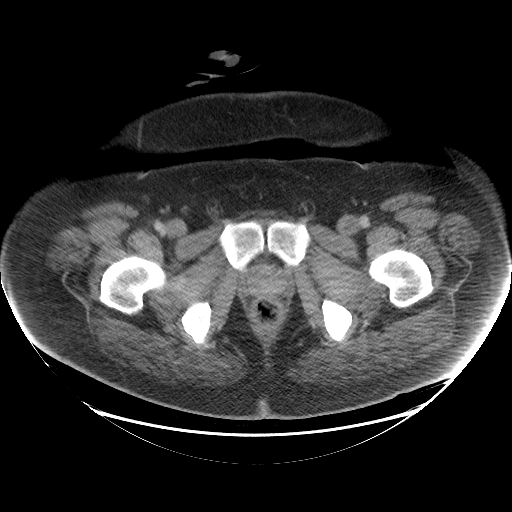
[im 12/104  bone]
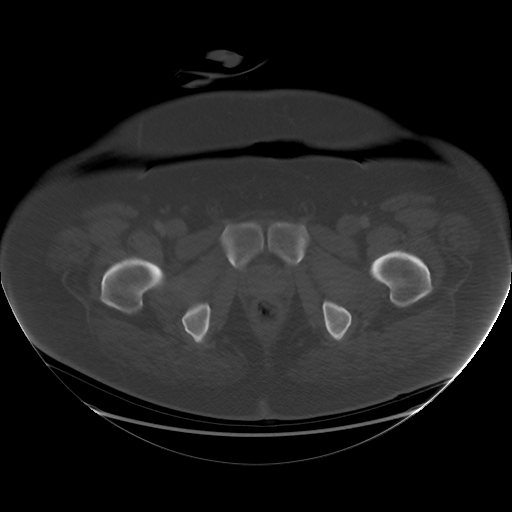
[im 23/104  soft-tissue]
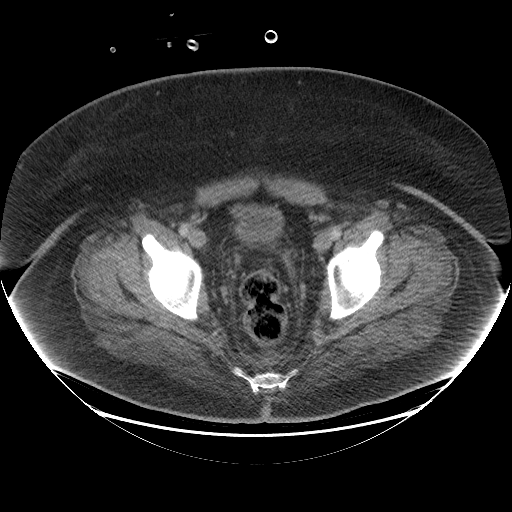
[im 35/104  soft-tissue]
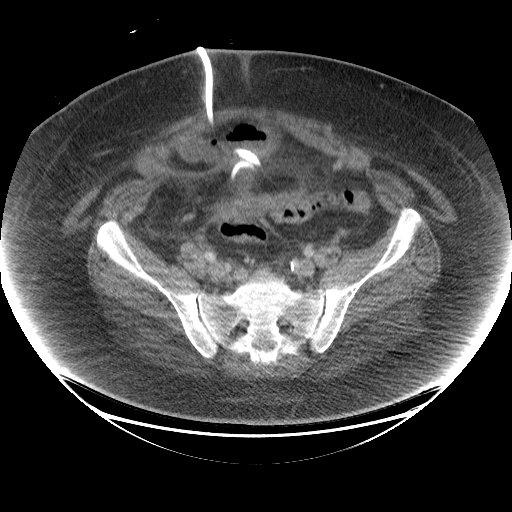
[im 46/104  soft-tissue]
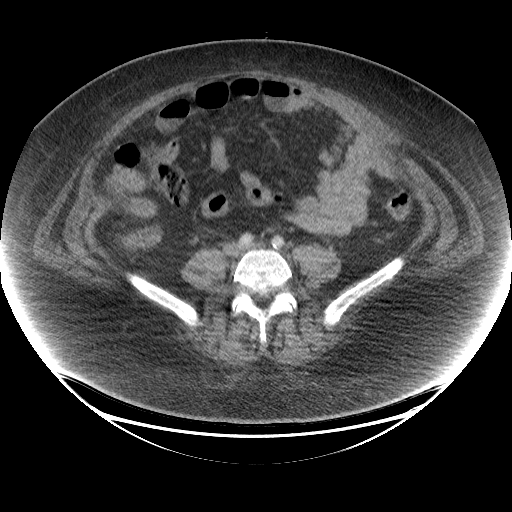
[im 58/104  soft-tissue]
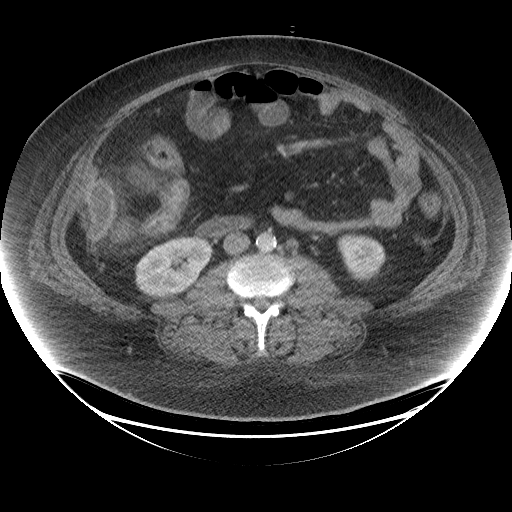
[im 58/104  lung]
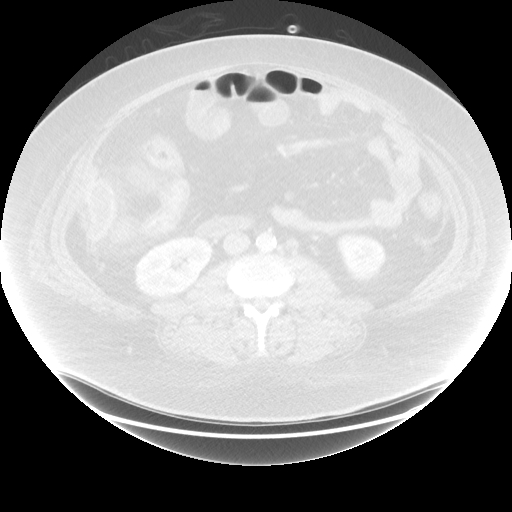
[im 69/104  soft-tissue]
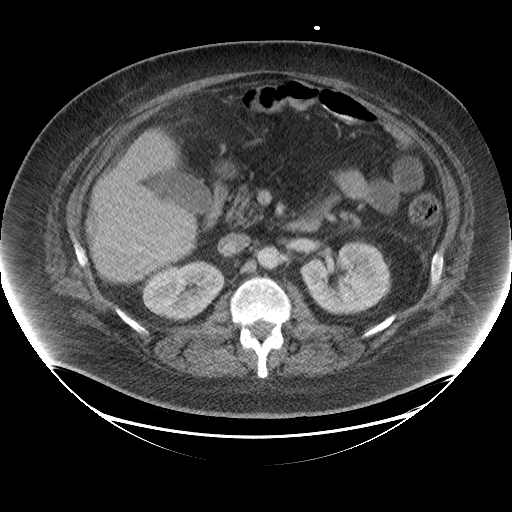
[im 69/104  lung]
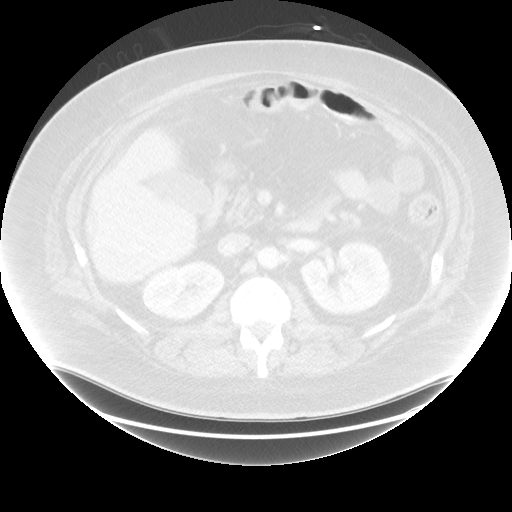
[im 81/104  soft-tissue]
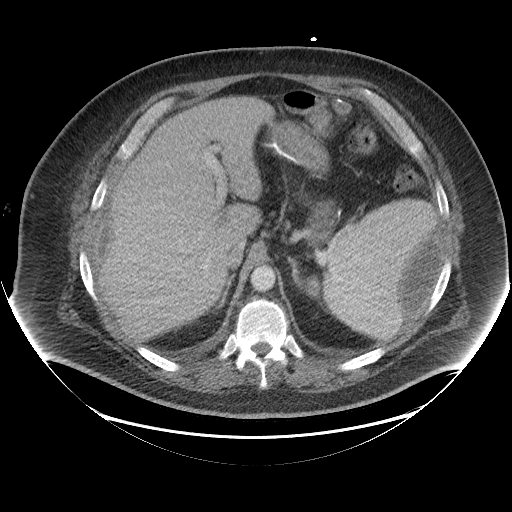
[im 81/104  lung]
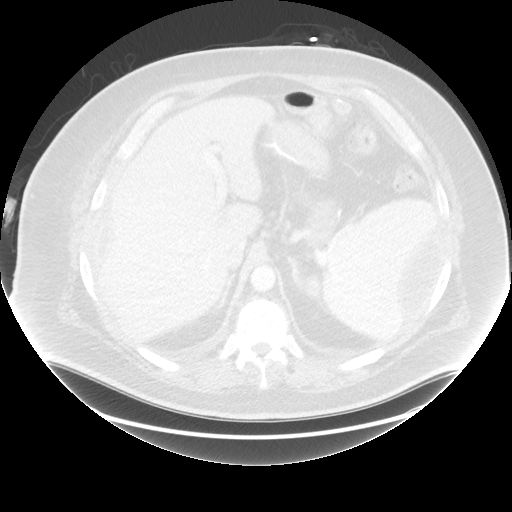
[im 92/104  soft-tissue]
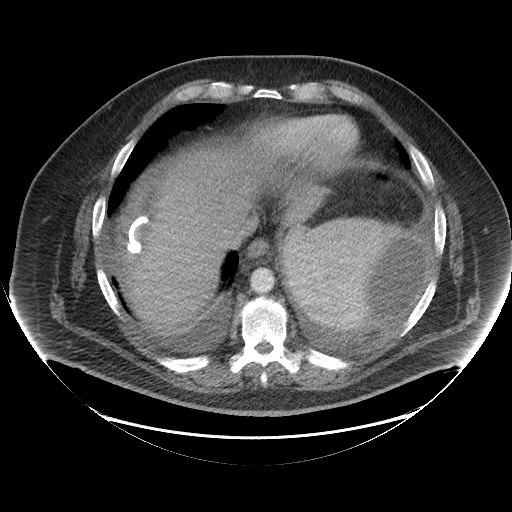
[im 92/104  lung]
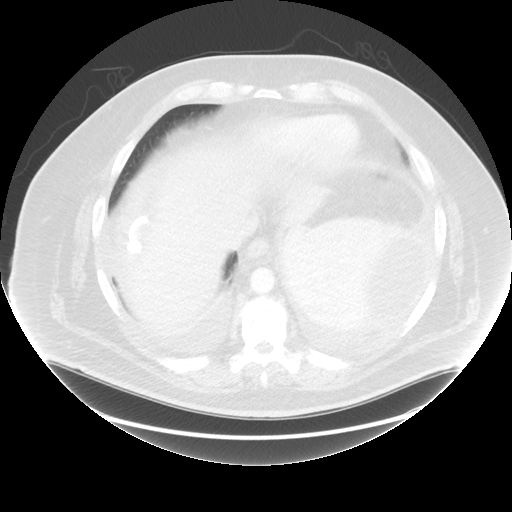

[Series 601: coronal body · coronal · 1.01mm/px · 1 of 162 slices shown, 2 images]
[im 54/162  soft-tissue]
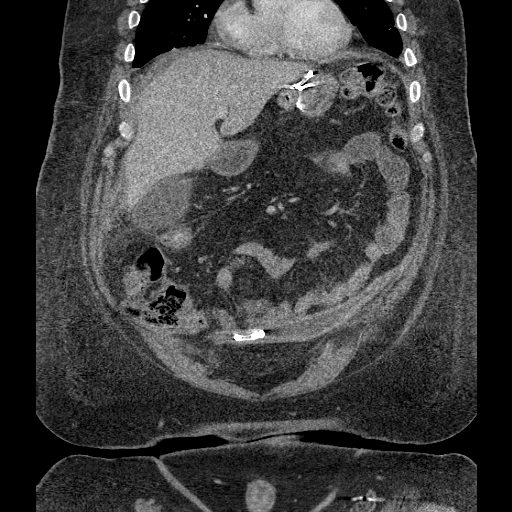
[im 54/162  bone]
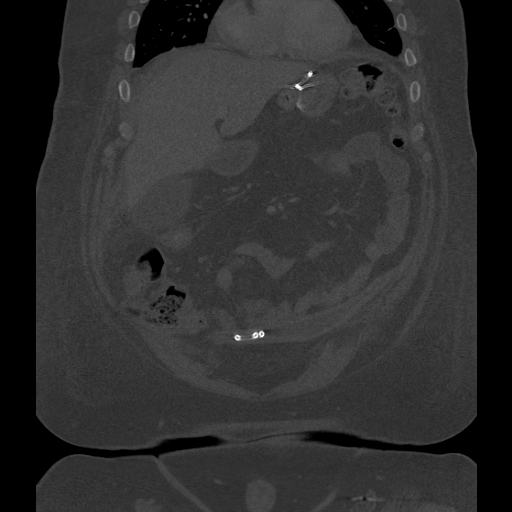

[Series 602: sagittal body · sagittal · 1.01mm/px · 3 of 190 slices shown]
[im 22/190  soft-tissue]
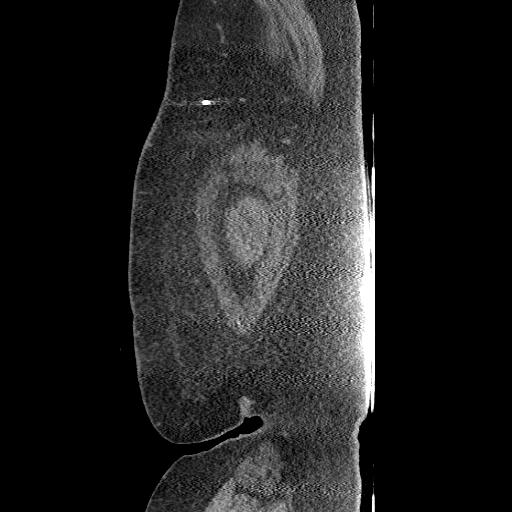
[im 43/190  soft-tissue]
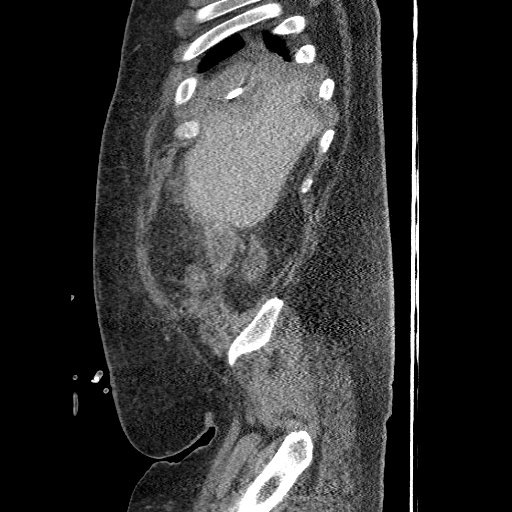
[im 64/190  soft-tissue]
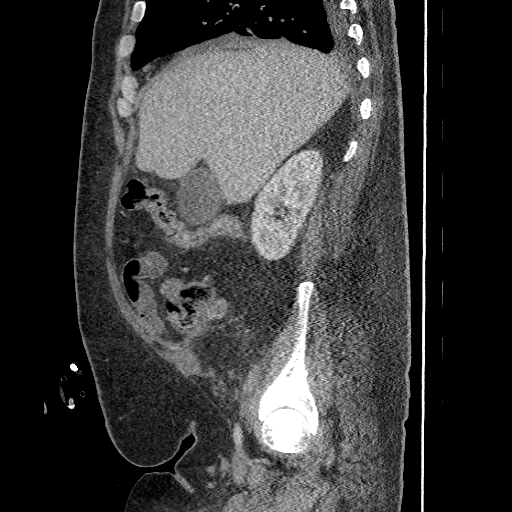

[12 of 36 positions shown; findings below may reference images not displayed]

FINDINGS: Lower chest: Bibasilar atelectasis and small bilateral pleural
effusions.

Hepatobiliary: No focal liver abnormality is seen. No gallstones,
gallbladder wall thickening, or biliary dilatation.

Pancreas: Unremarkable. No pancreatic ductal dilatation or
surrounding inflammatory changes.

Spleen: Normal in size without focal abnormality.

Adrenals/Urinary Tract: Adrenal glands are unremarkable. Kidneys are
normal, without renal calculi, focal lesion, or hydronephrosis.
Bladder is unremarkable.

Stomach/Bowel: No evidence of bowel obstruction or dilated bowel
loops. No free air.

Vascular/Lymphatic: No vascular abnormalities or enlarged lymph
nodes.

Reproductive: Prostate is unremarkable.

Other: Stable drain positioning lateral to the dome of the liver.
There is a small amount of residual adjacent perihepatic fluid that
continues inferiorly along the lateral margin of the liver and into
the pericolic gutter. Overall amount of fluid tracking inferiorly
has decreased since the prior study.

Stable positioning of epigastric drain adjacent to the bypassed
stomach. Residual abscess shows further decompression with no
significant fluid remaining adjacent to the drainage catheter.

Stable positioning of pelvic drainage catheter with no further
abscess seen. The drainage catheter abuts small bowel loops.

Perisplenic fluid remains present lateral to the spleen and measures
approximately 4.4 x 8 cm. Overall fluid volume adjacent to the
spleen is relatively stable.

Musculoskeletal: No acute or significant osseous findings.
IMPRESSION: 1. Small amount of perihepatic fluid remains present lateral to the
liver and tracking down into the right pericolic gutter. Total
amount of fluid appears decreased since the prior study. Stable
drain positioning lateral to the dome of the liver.
2. Stable positioning of epigastric drain adjacent to the stomach
without significant abscess present.
3. Stable positioning of pelvic drainage catheter with no further
abscess seen in this region. The drain abuts small bowel loops.
4. Relatively stable amount of residual fluid lateral to the spleen.

## 2018-03-01 IMAGING — XA IR CATHETER TUBE CHANGE
1 series · 2 of 2 positions shown · non-contrast
Comparison: none

INDICATION: Status post complications after prior gastric bypass surgery with
percutaneous drainage of multiple postoperative abscesses. Currently
a perihepatic drainage catheter is in place with no significant
output. A pelvic drainage catheter is also in place with previously
demonstrated fistula to adjacent small bowel with injection of the
drain on 02/11/2017. Plan now is for exchange of this drain into a
position short of the fistula to try to get the fistula to close.

[Series 300: dsa body · 2 of 2 slices shown]
[im 1/2]
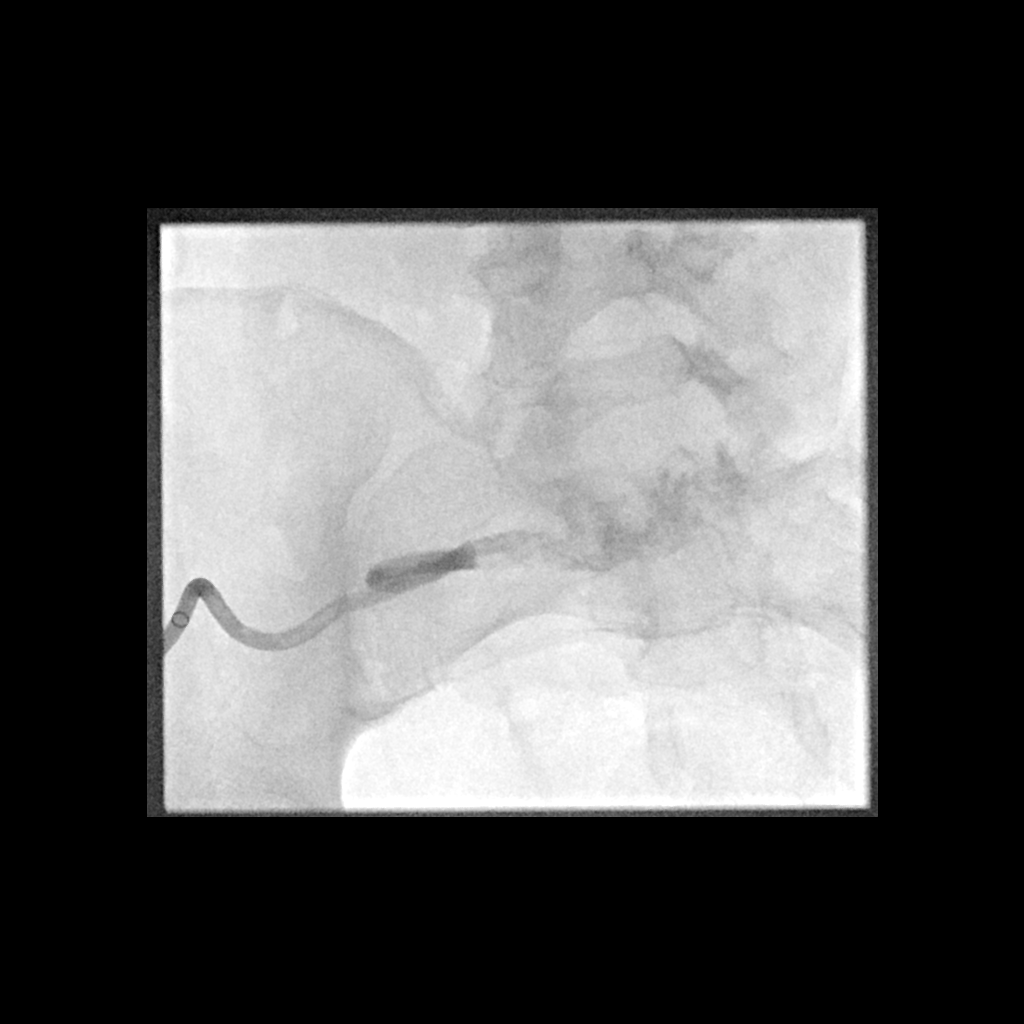
[im 2/2]
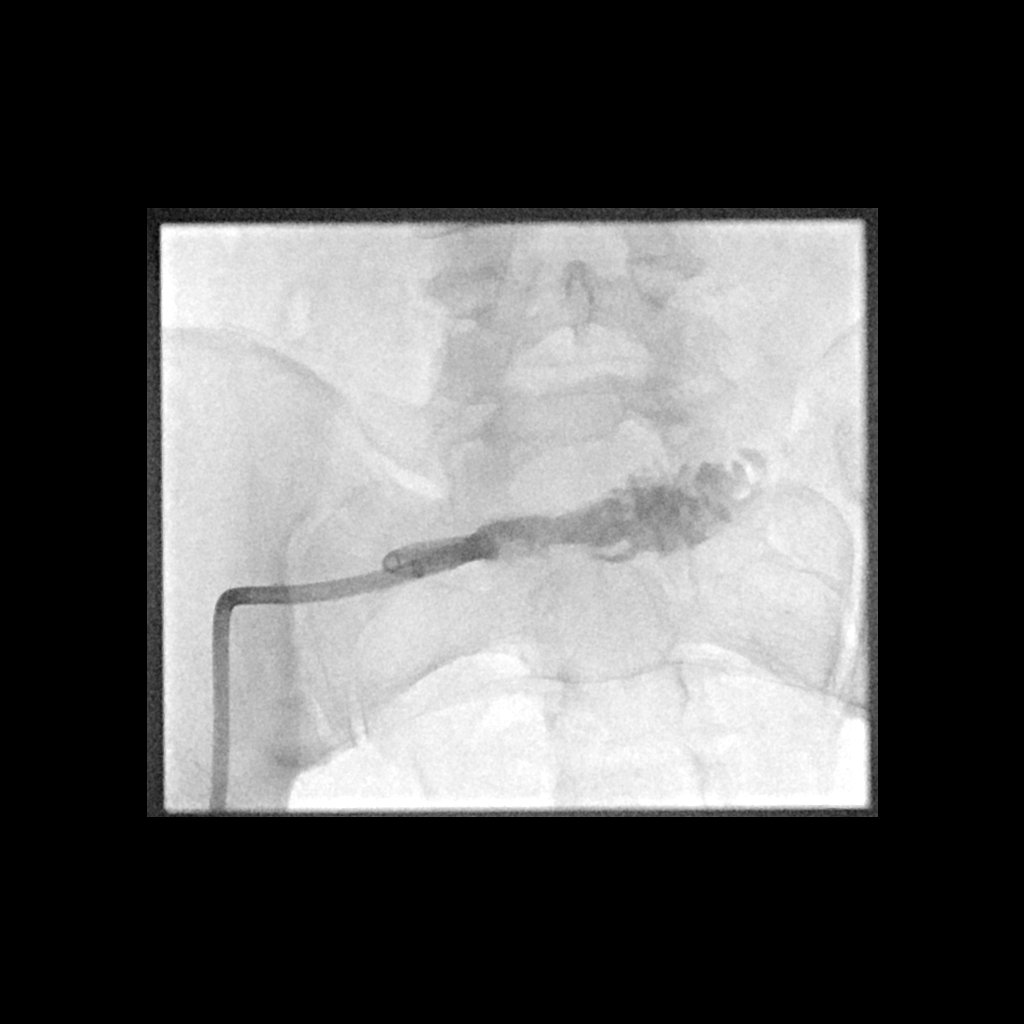

[2 of 2 positions shown; findings below may reference images not displayed]

EXAM:
IR CATHETER TUBE CHANGE

MEDICATIONS:
No additional medications other than sedation medication given
below.

ANESTHESIA/SEDATION:
Fentanyl 100 mcg IV; Versed 3.0 mg IV

Moderate Sedation Time:  11 minutes.

The patient was continuously monitored during the procedure by the
interventional radiology nurse under my direct supervision.

COMPLICATIONS:
None .

PROCEDURE:
Informed written consent was obtained from the patient after a
thorough discussion of the procedural risks, benefits and
alternatives. All questions were addressed. Maximal Sterile Barrier
Technique was utilized including caps, mask, sterile gowns, sterile
gloves, sterile drape, hand hygiene and skin antiseptic. A timeout
was performed prior to the initiation of the procedure.

The pre-existing 14 French percutaneous drain in the pelvis was
injected with contrast under fluoroscopy. The catheter was then cut
and removed over a guidewire. A new 14 French drain was then
advanced under fluoroscopy and position. The catheter was injected
with contrast material and a fluoroscopic spot image saved. The
catheter was then gently flushed with saline and connected to a
gravity drainage bag. It was secured at the skin with a StatLock
device.

The perihepatic drainage catheter was aspirated. It was then cut and
removed. A dressing was applied over the catheter exit site.
FINDINGS: There is a persistent fistula to the small bowel with injection of
the 14 French pelvic drainage catheter. A new catheter was exchanged
over a wire and positioned just short of the fistula. Contrast
injection after drain exchange continues to demonstrate filling of
the prior abscess tract and fistula.
IMPRESSION: 1. Exchange of pelvic drainage catheter after demonstration of
persistent fistula to small bowel. A new 14 French drain was
advanced short of the fistula. This will be left to gravity bag
drainage and will not be flushed. Contrast injection of this drain
will be performed in 2 weeks under fluoroscopy to re-assess fistula
patency.
2. Removal of perihepatic abscess drainage catheter due to no
residual drain output.

## 2018-06-06 ENCOUNTER — Encounter (HOSPITAL_COMMUNITY): Payer: Self-pay | Admitting: Emergency Medicine

## 2018-06-06 ENCOUNTER — Emergency Department (HOSPITAL_COMMUNITY): Payer: 59

## 2018-06-06 ENCOUNTER — Inpatient Hospital Stay (HOSPITAL_COMMUNITY)
Admission: EM | Admit: 2018-06-06 | Discharge: 2018-06-10 | DRG: 419 | Disposition: A | Discharge status: Home / Self Care | Payer: 59 | Attending: Surgery | Admitting: Surgery

## 2018-06-06 DIAGNOSIS — E119 Type 2 diabetes mellitus without complications: Secondary | ICD-10-CM | POA: Diagnosis present

## 2018-06-06 DIAGNOSIS — Z888 Allergy status to other drugs, medicaments and biological substances status: Secondary | ICD-10-CM | POA: Diagnosis not present

## 2018-06-06 DIAGNOSIS — E559 Vitamin D deficiency, unspecified: Secondary | ICD-10-CM | POA: Diagnosis present

## 2018-06-06 DIAGNOSIS — K660 Peritoneal adhesions (postprocedural) (postinfection): Secondary | ICD-10-CM | POA: Diagnosis present

## 2018-06-06 DIAGNOSIS — E785 Hyperlipidemia, unspecified: Secondary | ICD-10-CM | POA: Diagnosis present

## 2018-06-06 DIAGNOSIS — K8020 Calculus of gallbladder without cholecystitis without obstruction: Secondary | ICD-10-CM | POA: Diagnosis not present

## 2018-06-06 DIAGNOSIS — Z419 Encounter for procedure for purposes other than remedying health state, unspecified: Secondary | ICD-10-CM

## 2018-06-06 DIAGNOSIS — R945 Abnormal results of liver function studies: Secondary | ICD-10-CM | POA: Diagnosis not present

## 2018-06-06 DIAGNOSIS — G4733 Obstructive sleep apnea (adult) (pediatric): Secondary | ICD-10-CM | POA: Diagnosis present

## 2018-06-06 DIAGNOSIS — Z87891 Personal history of nicotine dependence: Secondary | ICD-10-CM

## 2018-06-06 DIAGNOSIS — I10 Essential (primary) hypertension: Secondary | ICD-10-CM | POA: Diagnosis present

## 2018-06-06 DIAGNOSIS — Z8261 Family history of arthritis: Secondary | ICD-10-CM | POA: Diagnosis not present

## 2018-06-06 DIAGNOSIS — K810 Acute cholecystitis: Secondary | ICD-10-CM

## 2018-06-06 DIAGNOSIS — R109 Unspecified abdominal pain: Secondary | ICD-10-CM | POA: Diagnosis not present

## 2018-06-06 DIAGNOSIS — Z8249 Family history of ischemic heart disease and other diseases of the circulatory system: Secondary | ICD-10-CM

## 2018-06-06 DIAGNOSIS — R1110 Vomiting, unspecified: Secondary | ICD-10-CM | POA: Diagnosis not present

## 2018-06-06 DIAGNOSIS — Z9884 Bariatric surgery status: Secondary | ICD-10-CM

## 2018-06-06 DIAGNOSIS — R1011 Right upper quadrant pain: Secondary | ICD-10-CM | POA: Diagnosis not present

## 2018-06-06 DIAGNOSIS — K8010 Calculus of gallbladder with chronic cholecystitis without obstruction: Secondary | ICD-10-CM | POA: Diagnosis not present

## 2018-06-06 DIAGNOSIS — E782 Mixed hyperlipidemia: Secondary | ICD-10-CM | POA: Diagnosis present

## 2018-06-06 DIAGNOSIS — Z79899 Other long term (current) drug therapy: Secondary | ICD-10-CM

## 2018-06-06 DIAGNOSIS — N200 Calculus of kidney: Secondary | ICD-10-CM | POA: Diagnosis not present

## 2018-06-06 DIAGNOSIS — K8050 Calculus of bile duct without cholangitis or cholecystitis without obstruction: Secondary | ICD-10-CM | POA: Diagnosis present

## 2018-06-06 DIAGNOSIS — K915 Postcholecystectomy syndrome: Secondary | ICD-10-CM | POA: Diagnosis not present

## 2018-06-06 LAB — CBC
HEMATOCRIT: 43.6 % (ref 39.0–52.0)
HEMOGLOBIN: 14.4 g/dL (ref 13.0–17.0)
MCH: 29.3 pg (ref 26.0–34.0)
MCHC: 33 g/dL (ref 30.0–36.0)
MCV: 88.8 fL (ref 80.0–100.0)
Platelets: 182 10*3/uL (ref 150–400)
RBC: 4.91 MIL/uL (ref 4.22–5.81)
RDW: 12.4 % (ref 11.5–15.5)
WBC: 5.1 10*3/uL (ref 4.0–10.5)
nRBC: 0 % (ref 0.0–0.2)

## 2018-06-06 LAB — URINALYSIS, ROUTINE W REFLEX MICROSCOPIC
Bilirubin Urine: NEGATIVE
Glucose, UA: 50 mg/dL — AB
HGB URINE DIPSTICK: NEGATIVE
KETONES UR: 20 mg/dL — AB
LEUKOCYTES UA: NEGATIVE
NITRITE: NEGATIVE
PH: 8 (ref 5.0–8.0)
Protein, ur: NEGATIVE mg/dL

## 2018-06-06 LAB — COMPREHENSIVE METABOLIC PANEL
ALT: 297 U/L — ABNORMAL HIGH (ref 0–44)
AST: 544 U/L — ABNORMAL HIGH (ref 15–41)
Albumin: 3.9 g/dL (ref 3.5–5.0)
Alkaline Phosphatase: 108 U/L (ref 38–126)
Anion gap: 13 (ref 5–15)
BUN: 12 mg/dL (ref 6–20)
CHLORIDE: 105 mmol/L (ref 98–111)
CO2: 18 mmol/L — ABNORMAL LOW (ref 22–32)
Calcium: 9 mg/dL (ref 8.9–10.3)
Creatinine, Ser: 0.83 mg/dL (ref 0.61–1.24)
Glucose, Bld: 186 mg/dL — ABNORMAL HIGH (ref 70–99)
POTASSIUM: 4.1 mmol/L (ref 3.5–5.1)
Sodium: 136 mmol/L (ref 135–145)
Total Bilirubin: 1.5 mg/dL — ABNORMAL HIGH (ref 0.3–1.2)
Total Protein: 6.8 g/dL (ref 6.5–8.1)

## 2018-06-06 LAB — SURGICAL PCR SCREEN
MRSA, PCR: NEGATIVE
Staphylococcus aureus: NEGATIVE

## 2018-06-06 LAB — LIPASE, BLOOD: LIPASE: 118 U/L — AB (ref 11–51)

## 2018-06-06 LAB — I-STAT CG4 LACTIC ACID, ED: Lactic Acid, Venous: 3.14 mmol/L (ref 0.5–1.9)

## 2018-06-06 MED ORDER — HYDROMORPHONE HCL 1 MG/ML IJ SOLN
1.0000 mg | Freq: Once | INTRAMUSCULAR | Status: AC
  Administered 2018-06-06: 1 mg via INTRAVENOUS
  Filled 2018-06-06: qty 1

## 2018-06-06 MED ORDER — PRAZOSIN HCL 1 MG PO CAPS
1.0000 mg | ORAL_CAPSULE | Freq: Every day | ORAL | Status: DC
  Administered 2018-06-06 – 2018-06-09 (×4): 1 mg via ORAL
  Filled 2018-06-06 (×4): qty 1

## 2018-06-06 MED ORDER — HEPARIN SODIUM (PORCINE) 5000 UNIT/ML IJ SOLN
5000.0000 [IU] | Freq: Three times a day (TID) | INTRAMUSCULAR | Status: DC
  Administered 2018-06-06 – 2018-06-10 (×11): 5000 [IU] via SUBCUTANEOUS
  Filled 2018-06-06 (×11): qty 1

## 2018-06-06 MED ORDER — SIMETHICONE 80 MG PO CHEW
40.0000 mg | CHEWABLE_TABLET | Freq: Four times a day (QID) | ORAL | Status: DC | PRN
  Filled 2018-06-06 (×2): qty 1

## 2018-06-06 MED ORDER — PIPERACILLIN-TAZOBACTAM 3.375 G IVPB
3.3750 g | Freq: Three times a day (TID) | INTRAVENOUS | Status: DC
  Administered 2018-06-06 – 2018-06-09 (×9): 3.375 g via INTRAVENOUS
  Filled 2018-06-06 (×7): qty 50

## 2018-06-06 MED ORDER — HYDROMORPHONE HCL 1 MG/ML IJ SOLN
0.5000 mg | Freq: Once | INTRAMUSCULAR | Status: DC | PRN
  Filled 2018-06-06: qty 1

## 2018-06-06 MED ORDER — SODIUM CHLORIDE 0.9 % IV BOLUS: 1000.0000 mL | Freq: Once | INTRAVENOUS | Status: DC

## 2018-06-06 MED ORDER — ONDANSETRON 4 MG PO TBDP: 4.0000 mg | ORAL_TABLET | Freq: Four times a day (QID) | ORAL | Status: DC | PRN

## 2018-06-06 MED ORDER — SERTRALINE HCL 50 MG PO TABS
50.0000 mg | ORAL_TABLET | Freq: Every day | ORAL | Status: DC
  Administered 2018-06-06 – 2018-06-10 (×5): 50 mg via ORAL
  Filled 2018-06-06 (×5): qty 1

## 2018-06-06 MED ORDER — SODIUM CHLORIDE 0.9% FLUSH: 3.0000 mL | Freq: Once | INTRAVENOUS | Status: DC

## 2018-06-06 MED ORDER — BUPROPION HCL 100 MG PO TABS
100.0000 mg | ORAL_TABLET | Freq: Every day | ORAL | Status: DC
  Administered 2018-06-06 – 2018-06-10 (×5): 100 mg via ORAL
  Filled 2018-06-06 (×6): qty 1

## 2018-06-06 MED ORDER — DOCUSATE SODIUM 100 MG PO CAPS
100.0000 mg | ORAL_CAPSULE | Freq: Two times a day (BID) | ORAL | Status: DC
  Administered 2018-06-06 – 2018-06-10 (×9): 100 mg via ORAL
  Filled 2018-06-06 (×9): qty 1

## 2018-06-06 MED ORDER — ONDANSETRON HCL 4 MG/2ML IJ SOLN
4.0000 mg | Freq: Once | INTRAMUSCULAR | Status: AC
  Administered 2018-06-06: 4 mg via INTRAVENOUS
  Filled 2018-06-06: qty 2

## 2018-06-06 MED ORDER — HYDRALAZINE HCL 20 MG/ML IJ SOLN: 10.0000 mg | INTRAMUSCULAR | Status: DC | PRN

## 2018-06-06 MED ORDER — DICYCLOMINE HCL 10 MG/ML IM SOLN
20.0000 mg | Freq: Once | INTRAMUSCULAR | Status: AC
  Administered 2018-06-06: 20 mg via INTRAMUSCULAR
  Filled 2018-06-06: qty 2

## 2018-06-06 MED ORDER — FAMOTIDINE IN NACL 20-0.9 MG/50ML-% IV SOLN
20.0000 mg | INTRAVENOUS | Status: AC
  Administered 2018-06-06: 20 mg via INTRAVENOUS
  Filled 2018-06-06: qty 50

## 2018-06-06 MED ORDER — TRAMADOL HCL 50 MG PO TABS
50.0000 mg | ORAL_TABLET | Freq: Four times a day (QID) | ORAL | Status: DC | PRN
  Administered 2018-06-06 – 2018-06-07 (×3): 50 mg via ORAL
  Filled 2018-06-06 (×4): qty 1

## 2018-06-06 MED ORDER — HYDROMORPHONE HCL 1 MG/ML IJ SOLN
1.0000 mg | Freq: Once | INTRAMUSCULAR | Status: AC | PRN
  Administered 2018-06-06: 1 mg via INTRAVENOUS

## 2018-06-06 MED ORDER — LACTATED RINGERS IV SOLN
INTRAVENOUS | Status: DC
  Administered 2018-06-06 – 2018-06-09 (×7): via INTRAVENOUS

## 2018-06-06 MED ORDER — SODIUM CHLORIDE 0.9 % IV SOLN
2.0000 g | Freq: Once | INTRAVENOUS | Status: AC
  Administered 2018-06-06: 2 g via INTRAVENOUS
  Filled 2018-06-06: qty 20

## 2018-06-06 MED ORDER — ALUM & MAG HYDROXIDE-SIMETH 200-200-20 MG/5ML PO SUSP
15.0000 mL | Freq: Four times a day (QID) | ORAL | Status: DC | PRN
  Administered 2018-06-07: 15 mL via ORAL
  Filled 2018-06-06 (×2): qty 30

## 2018-06-06 MED ORDER — ONDANSETRON HCL 4 MG/2ML IJ SOLN
4.0000 mg | Freq: Four times a day (QID) | INTRAMUSCULAR | Status: DC | PRN
  Administered 2018-06-06 – 2018-06-09 (×6): 4 mg via INTRAVENOUS
  Filled 2018-06-06 (×5): qty 2

## 2018-06-06 MED ORDER — SODIUM CHLORIDE 0.9 % IV BOLUS
2000.0000 mL | Freq: Once | INTRAVENOUS | Status: AC
  Administered 2018-06-06: 2000 mL via INTRAVENOUS

## 2018-06-06 MED ORDER — GABAPENTIN 300 MG PO CAPS
300.0000 mg | ORAL_CAPSULE | Freq: Three times a day (TID) | ORAL | Status: DC
  Administered 2018-06-06 – 2018-06-07 (×2): 300 mg via ORAL
  Filled 2018-06-06 (×6): qty 1

## 2018-06-06 MED ORDER — SODIUM CHLORIDE 0.9 % IV SOLN
Freq: Once | INTRAVENOUS | Status: AC
  Administered 2018-06-06: 06:00:00 via INTRAVENOUS

## 2018-06-06 MED ORDER — HYDROMORPHONE HCL 1 MG/ML IJ SOLN
1.0000 mg | INTRAMUSCULAR | Status: DC | PRN
  Administered 2018-06-06 – 2018-06-08 (×14): 1 mg via INTRAVENOUS
  Filled 2018-06-06 (×14): qty 1

## 2018-06-06 MED ORDER — IOHEXOL 300 MG/ML  SOLN
100.0000 mL | Freq: Once | INTRAMUSCULAR | Status: AC | PRN
  Administered 2018-06-06: 100 mL via INTRAVENOUS

## 2018-06-06 MED ORDER — ACETAMINOPHEN 500 MG PO TABS
1000.0000 mg | ORAL_TABLET | Freq: Four times a day (QID) | ORAL | Status: DC
  Administered 2018-06-06 – 2018-06-10 (×16): 1000 mg via ORAL
  Filled 2018-06-06 (×16): qty 2

## 2018-06-06 MED ORDER — HYDROXYZINE HCL 25 MG PO TABS
25.0000 mg | ORAL_TABLET | Freq: Four times a day (QID) | ORAL | Status: DC | PRN
  Administered 2018-06-06: 25 mg via ORAL
  Filled 2018-06-06: qty 1

## 2018-06-06 MED ORDER — TRAZODONE HCL 100 MG PO TABS
100.0000 mg | ORAL_TABLET | Freq: Every evening | ORAL | Status: DC | PRN
  Administered 2018-06-06 – 2018-06-09 (×4): 100 mg via ORAL
  Filled 2018-06-06 (×4): qty 1

## 2018-06-06 MED ORDER — HYDROMORPHONE HCL 1 MG/ML IJ SOLN
0.5000 mg | INTRAMUSCULAR | Status: DC | PRN
  Administered 2018-06-06 (×2): 0.5 mg via INTRAVENOUS
  Filled 2018-06-06 (×2): qty 1

## 2018-06-06 NOTE — ED Notes (Signed)
Dr Preston Fleeting informed of lactic acid results

## 2018-06-06 NOTE — ED Triage Notes (Signed)
Pt has right sided abdominal pain and emesis.  Pt is diaphoretic in triage. Hx of abdominal surgery for weigh loss

## 2018-06-06 NOTE — Progress Notes (Signed)
Received patient from ED. Patient alert and oriented. Complaining of pain 10/10. Stating Dilaudid 0.5mg  is not doing anything for his pain. Paged MD for increase in PRN pain medication.   Patient made comfortable in bed. Oriented to bed controls and call bell. Will continue to monitor.

## 2018-06-06 NOTE — ED Notes (Signed)
Called pharm to request med verification

## 2018-06-06 NOTE — H&P (Addendum)
CC: RUQ pain  HPI: Albert White is an 43 y.o. male with hx of HTN, HLD, DM whom presents with <1d hx of RUQ pain. Denies n/v. Pain is described as sharp and radiates some to his back. He has never had this before. Nothing makes the pain better/worse  He underwent laparoscopic RYGB at [facility] 11/2016 with Dr. Clent RidgesWalsh. This was c/b leak and he was ultimately managed here. He had a prolonged recovery related to this leak. There was a large abscess abutting the GJ anastomosis as well as other abscesses throughout his upper abdomen and chest. His leaks were ultimately managed percutaneously and with a chest tube.  He has had bariatric follow-up with one of my partners, Dr. Sheliah HatchKinsinger  Past Medical History:  Diagnosis Date  . Abscess   . Diabetes mellitus without complication (HCC)   . Hidradenitis suppurativa   . Hyperlipemia   . Morbid obesity (HCC)   . Vertigo     Past Surgical History:  Procedure Laterality Date  . INCISION AND DRAINAGE ABSCESS Right 12/15/2013   Procedure: INCISION AND DRAINAGE Right Posterior neck ABSCESS;  Surgeon: Cherylynn RidgesJames O [name], MD;  [facility];  Service: General;  Laterality: Right;  . INCISION AND DRAINAGE DEEP NECK ABSCESS     Hidradenitis - 3-4 times at [facility] and [place]  . IR CATHETER TUBE CHANGE  02/18/2017  . IR RADIOLOGIST EVAL & MGMT  02/11/2017  . IR RADIOLOGIST EVAL & MGMT  03/03/2017  . IR THORACENTESIS ASP PLEURAL SPACE W/IMG GUIDE  01/23/2017    Family History  Problem Relation Age of Onset  . Arthritis Mother   . Hypertension Mother   . Cancer Other   . Heart disease Other     Social:  reports that he quit smoking about 4 years ago. He smoked 1.00 pack per day. He has never used smokeless tobacco. He reports that he does not drink alcohol or use drugs.  Allergies:  Allergies  Allergen Reactions  . Ibuprofen Other (See Comments)    Has bar    Medications: I have reviewed the patient's current medications.  Results for  orders placed or performed during the hospital encounter of 06/06/18 (from the past 48 hour(s))  Lipase, blood     Status: Abnormal   Collection Time: 06/06/18  3:20 AM  Result Value Ref Range   Lipase 118 (H) 11 - 51 U/L    Comment: Performed at [facility], [address] [place], [place] [address]  Comprehensive metabolic panel     Status: Abnormal   Collection Time: 06/06/18  3:20 AM  Result Value Ref Range   Sodium 136 135 - 145 mmol/L   Potassium 4.1 3.5 - 5.1 mmol/L   Chloride 105 98 - 111 mmol/L   CO2 18 (L) 22 - 32 mmol/L   Glucose, Bld 186 (H) 70 - 99 mg/dL   BUN 12 6 - 20 mg/dL   Creatinine, Ser 9.620.83 0.61 - 1.24 mg/dL   Calcium 9.0 8.9 - 95.210.3 mg/dL   Total Protein 6.8 6.5 - 8.1 g/dL   Albumin 3.9 3.5 - 5.0 g/dL   AST 841544 (H) 15 - 41 U/L   ALT 297 (H) 0 - 44 U/L   Alkaline Phosphatase 108 38 - 126 U/L   Total Bilirubin 1.5 (H) 0.3 - 1.2 mg/dL   GFR calc non Af Amer >60 >60 mL/min   GFR calc Af Amer >60 >60 mL/min   Anion gap 13 5 -  15    Comment: Performed at [facility], [address] [place], [place] [address]  CBC     Status: None   Collection Time: 06/06/18  3:20 AM  Result Value Ref Range   WBC 5.1 4.0 - 10.5 K/uL   RBC 4.91 4.22 - 5.81 MIL/uL   Hemoglobin 14.4 13.0 - 17.0 g/dL   HCT 21.3 08.6 - 57.8 %   MCV 88.8 80.0 - 100.0 fL   MCH 29.3 26.0 - 34.0 pg   MCHC 33.0 30.0 - 36.0 g/dL   RDW 46.9 62.9 - 52.8 %   Platelets 182 150 - 400 K/uL   nRBC 0.0 0.0 - 0.2 %    Comment: Performed at [facility], [address] [place], [place] [address]  I-Stat CG4 Lactic Acid, ED     Status: Abnormal   Collection Time: 06/06/18  4:06 AM  Result Value Ref Range   Lactic Acid, Venous 3.14 (HH) 0.5 - 1.9 mmol/L   Comment NOTIFIED PHYSICIAN   Urinalysis, Routine w reflex microscopic     Status: Abnormal   Collection Time: 06/06/18  5:36 AM  Result Value Ref Range   Color, Urine YELLOW YELLOW   APPearance CLEAR CLEAR   Specific Gravity, Urine  >1.046 (H) 1.005 - 1.030   pH 8.0 5.0 - 8.0   Glucose, UA 50 (A) NEGATIVE mg/dL   Hgb urine dipstick NEGATIVE NEGATIVE   Bilirubin Urine NEGATIVE NEGATIVE   Ketones, ur 20 (A) NEGATIVE mg/dL   Protein, ur NEGATIVE NEGATIVE mg/dL   Nitrite NEGATIVE NEGATIVE   Leukocytes, UA NEGATIVE NEGATIVE    Comment: Performed at [facility], [address] [place], [place] [address]    US Abdomen Complete  Result Date: 06/06/2018 CLINICAL DATA:  Initial evaluation for acute right upper quadrant pain. EXAM: ABDOMEN ULTRASOUND COMPLETE COMPARISON:  Prior CT from earlier the same day. FINDINGS: Gallbladder: Layering sludge seen within the gallbladder lumen. No frank shadowing stones. Gallbladder wall measured at the upper limits of normal at 3 mm. Small amount of free pericholecystic fluid. No sonographic Murphy sign elicited on exam. Common bile duct: Diameter: 3.7 mm Liver: No focal lesion identified. Within normal limits in parenchymal echogenicity. Portal vein is patent on color Doppler imaging with normal direction of blood flow towards the liver. IVC: No abnormality visualized. Pancreas: Not visualized. Spleen: Size and appearance within normal limits. Right Kidney: Length: 11.6 cm. Echogenicity within normal limits. No mass or hydronephrosis visualized. Left Kidney: Length: 13.3 cm. Echogenicity within normal limits. No mass or hydronephrosis visualized. Abdominal aorta: No aneurysm visualized. Other findings: None. IMPRESSION: 1. Distended gallbladder with internal sludge without frank cholelithiasis, with associated small volume free pericholecystic fluid. No other sonographic features to suggest acute cholecystitis. 2. No biliary dilatation. 3. Otherwise unremarkable abdominal ultrasound. Electronically Signed   By: Rise Mu M.D.   On: 06/06/2018 05:05   Ct Abdomen Pelvis W Contrast  Result Date: 06/06/2018 CLINICAL DATA:  Right abdominal pain and vomiting. History of bariatric  surgery. EXAM: CT ABDOMEN AND PELVIS WITH CONTRAST TECHNIQUE: Multidetector CT imaging of the abdomen and pelvis was performed using the standard protocol following bolus administration of intravenous contrast. CONTRAST:  OMNIPAQUE IOHEXOL 300 MG/ML  SOLN COMPARISON:  06/20/2017 CT abdomen/pelvis. FINDINGS: Lower chest: No significant pulmonary nodules or acute consolidative airspace disease. Hepatobiliary: Normal liver size. Hypodense 3.0 x 1.4 cm ovoid collection at the posterior right liver capsule (series 3/image 27), unchanged since  06/20/2017 CT and representing the chronic residua of a much larger fluid collection as seen on 01/15/2017 CT. Subcentimeter hypodense posterior left liver lobe lesion is too small to characterize and is unchanged, considered benign. No new liver lesions. Distended gallbladder. Diffuse gallbladder or wall thickening. No new pericholecystic fluid and fat stranding. No radiopaque cholelithiasis. No biliary ductal dilatation. Pancreas: Normal, with no mass or duct dilation. Spleen: Normal size. No mass. Adrenals/Urinary Tract: Normal adrenals. Punctate nonobstructing lower right renal stone. Nonobstructing 7 mm upper left renal stone. No hydronephrosis. Simple 1.6 cm posterior lower left renal cyst. Additional subcentimeter hypodense renal cortical lesion in the upper right kidney is too small to characterize and requires no follow-up. Normal bladder. Stomach/Bowel: Small hiatal hernia involving the gastric pouch. Otherwise expected postsurgical changes from gastric bypass surgery with intact appearing gastrojejunostomy and with collapsed excluded distal stomach. Normal caliber small bowel with no small bowel wall thickening. Normal appendix. Normal large bowel with no diverticulosis, large bowel wall thickening or pericolonic fat stranding. Vascular/Lymphatic: Atherosclerotic nonaneurysmal abdominal aorta. Patent portal, splenic, hepatic and renal veins. No pathologically  enlarged lymph nodes in the abdomen or pelvis. Reproductive: Normal size prostate. Other: No pneumoperitoneum, ascites or focal fluid collection. Musculoskeletal: No aggressive appearing focal osseous lesions. Mild thoracolumbar spondylosis. IMPRESSION: 1. Distended gallbladder with diffuse gallbladder wall thickening and pericholecystic fluid and fat stranding, suspicious for acute cholecystitis. No radiopaque cholelithiasis. Suggest correlation with right upper quadrant abdominal sonogram. 2. Small hiatal hernia. Otherwise expected postsurgical changes from gastric bypass surgery with no evidence of bowel obstruction or acute bowel inflammation. 3. Nonobstructing bilateral nephrolithiasis. 4.  Aortic Atherosclerosis (ICD10-I70.0). Electronically Signed   By: Delbert Phenix M.D.   On: 06/06/2018 04:56    ROS - all of the below systems have been reviewed with the patient and positives are indicated with bold text General: chills, fever or night sweats Eyes: blurry vision or double vision ENT: epistaxis or sore throat Allergy/Immunology: itchy/watery eyes or nasal congestion Hematologic/Lymphatic: bleeding problems, blood clots or swollen lymph nodes Endocrine: temperature intolerance or unexpected weight changes Breast: new or changing breast lumps or nipple discharge Resp: cough, shortness of breath, or wheezing CV: chest pain or dyspnea on exertion GI: as per HPI GU: dysuria, trouble voiding, or hematuria MSK: joint pain or joint stiffness Neuro: TIA or stroke symptoms Derm: pruritus and skin lesion changes Psych: anxiety and depression  PE Blood pressure 118/66, pulse (!) 52, temperature 98.2 F (36.8 C), temperature source Oral, resp. rate 20, SpO2 100 %. Constitutional: NAD; conversant; no deformities Eyes: Moist conjunctiva; no lid lag; anicteric; PERRL Neck: Trachea midline; no thyromegaly Lungs: Normal respiratory effort; no tactile fremitus CV: RRR; no palpable thrills; no pitting  edema GI: Abd soft, mildly ttp in RUQ; no rebound; no guarding; negative Murphy's sign; no palpable hepatosplenomegaly MSK: Normal gait; no clubbing/cyanosis Psychiatric: Appropriate affect; alert and oriented x3 Lymphatic: No palpable cervical or axillary lymphadenopathy  Results for orders placed or performed during the hospital encounter of 06/06/18 (from the past 48 hour(s))  Lipase, blood     Status: Abnormal   Collection Time: 06/06/18  3:20 AM  Result Value Ref Range   Lipase 118 (H) 11 - 51 U/L    Comment: Performed at [facility], [address], [place], [place] [address]  Comprehensive metabolic panel     Status: Abnormal   Collection Time: 06/06/18  3:20 AM  Result Value Ref Range   Sodium 136 135 - 145 mmol/L  Potassium 4.1 3.5 - 5.1 mmol/L   Chloride 105 98 - 111 mmol/L   CO2 18 (L) 22 - 32 mmol/L   Glucose, Bld 186 (H) 70 - 99 mg/dL   BUN 12 6 - 20 mg/dL   Creatinine, Ser 1.610.83 0.61 - 1.24 mg/dL   Calcium 9.0 8.9 - 09.610.3 mg/dL   Total Protein 6.8 6.5 - 8.1 g/dL   Albumin 3.9 3.5 - 5.0 g/dL   AST 045544 (H) 15 - 41 U/L   ALT 297 (H) 0 - 44 U/L   Alkaline Phosphatase 108 38 - 126 U/L   Total Bilirubin 1.5 (H) 0.3 - 1.2 mg/dL   GFR calc non Af Amer >60 >60 mL/min   GFR calc Af Amer >60 >60 mL/min   Anion gap 13 5 - 15    Comment: Performed at [facility], [address] [place], [place] [address]  CBC     Status: None   Collection Time: 06/06/18  3:20 AM  Result Value Ref Range   WBC 5.1 4.0 - 10.5 K/uL   RBC 4.91 4.22 - 5.81 MIL/uL   Hemoglobin 14.4 13.0 - 17.0 g/dL   HCT 19.143.6 47.839.0 - 29.552.0 %   MCV 88.8 80.0 - 100.0 fL   MCH 29.3 26.0 - 34.0 pg   MCHC 33.0 30.0 - 36.0 g/dL   RDW 62.112.4 30.811.5 - 65.715.5 %   Platelets 182 150 - 400 K/uL   nRBC 0.0 0.0 - 0.2 %    Comment: Performed at [facility], [address] [place], [place] [address]  I-Stat CG4 Lactic Acid, ED     Status: Abnormal   Collection Time: 06/06/18  4:06 AM  Result Value Ref Range     Lactic Acid, Venous 3.14 (HH) 0.5 - 1.9 mmol/L   Comment NOTIFIED PHYSICIAN   Urinalysis, Routine w reflex microscopic     Status: Abnormal   Collection Time: 06/06/18  5:36 AM  Result Value Ref Range   Color, Urine YELLOW YELLOW   APPearance CLEAR CLEAR   Specific Gravity, Urine >1.046 (H) 1.005 - 1.030   pH 8.0 5.0 - 8.0   Glucose, UA 50 (A) NEGATIVE mg/dL   Hgb urine dipstick NEGATIVE NEGATIVE   Bilirubin Urine NEGATIVE NEGATIVE   Ketones, ur 20 (A) NEGATIVE mg/dL   Protein, ur NEGATIVE NEGATIVE mg/dL   Nitrite NEGATIVE NEGATIVE   Leukocytes, UA NEGATIVE NEGATIVE    Comment: Performed at [facility], [address] [place], [place] [address]    [place] Abdomen Complete  Result Date: 06/06/2018 CLINICAL DATA:  Initial evaluation for acute right upper quadrant pain. EXAM: ABDOMEN ULTRASOUND COMPLETE COMPARISON:  Prior CT from earlier the same day. FINDINGS: Gallbladder: Layering sludge seen within the gallbladder lumen. No frank shadowing stones. Gallbladder wall measured at the upper limits of normal at 3 mm. Small amount of free pericholecystic fluid. No sonographic Murphy sign elicited on exam. Common bile duct: Diameter: 3.7 mm Liver: No focal lesion identified. Within normal limits in parenchymal echogenicity. Portal vein is patent on color Doppler imaging with normal direction of blood flow towards the liver. IVC: No abnormality visualized. Pancreas: Not visualized. Spleen: Size and appearance within normal limits. Right Kidney: Length: 11.6 cm. Echogenicity within normal limits. No mass or hydronephrosis visualized. Left Kidney: Length: 13.3 cm. Echogenicity within normal limits. No mass or hydronephrosis visualized. Abdominal aorta: No aneurysm visualized. Other findings: None. IMPRESSION: 1. Distended gallbladder with internal sludge without frank  cholelithiasis, with associated small volume free pericholecystic fluid. No other sonographic features to suggest acute cholecystitis.  2. No biliary dilatation. 3. Otherwise unremarkable abdominal ultrasound. Electronically Signed   By: Rise Mu M.D.   On: 06/06/2018 05:05   Ct Abdomen Pelvis W Contrast  Result Date: 06/06/2018 CLINICAL DATA:  Right abdominal pain and vomiting. History of bariatric surgery. EXAM: CT ABDOMEN AND PELVIS WITH CONTRAST TECHNIQUE: Multidetector CT imaging of the abdomen and pelvis was performed using the standard protocol following bolus administration of intravenous contrast. CONTRAST:  OMNIPAQUE IOHEXOL 300 MG/ML  SOLN COMPARISON:  06/20/2017 CT abdomen/pelvis. FINDINGS: Lower chest: No significant pulmonary nodules or acute consolidative airspace disease. Hepatobiliary: Normal liver size. Hypodense 3.0 x 1.4 cm ovoid collection at the posterior right liver capsule (series 3/image 27), unchanged since 06/20/2017 CT and representing the chronic residua of a much larger fluid collection as seen on 01/15/2017 CT. Subcentimeter hypodense posterior left liver lobe lesion is too small to characterize and is unchanged, considered benign. No new liver lesions. Distended gallbladder. Diffuse gallbladder or wall thickening. No new pericholecystic fluid and fat stranding. No radiopaque cholelithiasis. No biliary ductal dilatation. Pancreas: Normal, with no mass or duct dilation. Spleen: Normal size. No mass. Adrenals/Urinary Tract: Normal adrenals. Punctate nonobstructing lower right renal stone. Nonobstructing 7 mm upper left renal stone. No hydronephrosis. Simple 1.6 cm posterior lower left renal cyst. Additional subcentimeter hypodense renal cortical lesion in the upper right kidney is too small to characterize and requires no follow-up. Normal bladder. Stomach/Bowel: Small hiatal hernia involving the gastric pouch. Otherwise expected postsurgical changes from gastric bypass surgery with intact appearing gastrojejunostomy and with collapsed excluded distal stomach. Normal caliber small bowel with no  small bowel wall thickening. Normal appendix. Normal large bowel with no diverticulosis, large bowel wall thickening or pericolonic fat stranding. Vascular/Lymphatic: Atherosclerotic nonaneurysmal abdominal aorta. Patent portal, splenic, hepatic and renal veins. No pathologically enlarged lymph nodes in the abdomen or pelvis. Reproductive: Normal size prostate. Other: No pneumoperitoneum, ascites or focal fluid collection. Musculoskeletal: No aggressive appearing focal osseous lesions. Mild thoracolumbar spondylosis. IMPRESSION: 1. Distended gallbladder with diffuse gallbladder wall thickening and pericholecystic fluid and fat stranding, suspicious for acute cholecystitis. No radiopaque cholelithiasis. Suggest correlation with right upper quadrant abdominal sonogram. 2. Small hiatal hernia. Otherwise expected postsurgical changes from gastric bypass surgery with no evidence of bowel obstruction or acute bowel inflammation. 3. Nonobstructing bilateral nephrolithiasis. 4.  Aortic Atherosclerosis (ICD10-I70.0). Electronically Signed   By: Delbert Phenix M.D.   On: 06/06/2018 04:56   A/P: Albert White is an 43 y.o. male with possible cholecystitis vs choledocholithiasis/pancreatitis; tbili 1.5; lipase 118  -Significant transaminitis; could have passed or be passing a stone -Will admit to the hospital, clear liquids -IV abx -Reassess labs tomorrow; I discussed the case with our bariatric surgeons as well. If he improves clinically, we may end up planning to set him up for follow-up for gallbladder removal where it would be done at [facility] with our bariatric team and potentially an ERCP team ready to go if the cholangiogram is positive  Stephanie Coup. Cliffton Asters, M.D. [facility]

## 2018-06-06 NOTE — ED Notes (Signed)
Pt unable to void at this time. 

## 2018-06-06 NOTE — ED Provider Notes (Addendum)
[name] EMERGENCY DEPARTMENT Provider Note   CSN: [id number] Arrival date & time: 06/06/18  0302     History   Chief Complaint Chief Complaint  Patient presents with  . Abdominal Pain  . Emesis    HPI Albert White is a 43 y.o. male.  43 year old male with history of DM, HLD, OSA, Roux-en-Y/gastric bypass on 12/08/16 in [place] by Dr. Franki White complicated by sepsis due to peritonitis and multiple intra-abdominal abscess as well as pleural effusion s/p chest tube placement presents to the ED for abdominal pain.  He has been experiencing constant pain to the right side of his abdomen which began suddenly at 2200 tonight.  He has had multiple episodes of vomiting and dry heaves with diaphoresis.  No medications taken PTA.  He had a normal bowel movement earlier in the day, but no BM since onset of pain and vomiting.  He has continued to pass flatus since onset of pain.  No fever, diarrhea, urinary symptoms, sick contacts.  Had a pizza for lunch and some raisin toast tonight.  Last followed with Dr. Sheliah White of [facility] for office f/u in 01/2018.  The history is provided by the patient and the spouse. No language interpreter was used.  Abdominal Pain  Associated symptoms: vomiting   Emesis  Associated symptoms: abdominal pain     Past Medical History:  Diagnosis Date  . Abscess   . Diabetes mellitus without complication (HCC)   . Hidradenitis suppurativa   . Hyperlipemia   . Morbid obesity (HCC)   . Vertigo     Patient Active Problem List   Diagnosis Date Noted  . Polysubstance (excluding opioids) dependence, daily use (HCC) 06/24/2017  . MDD (major depressive disorder), recurrent severe, without psychosis (HCC) 06/22/2017  . Major depressive disorder, single episode, severe (HCC) 06/21/2017  . Polysubstance abuse (HCC) 06/21/2017  . Pain management   . Hypomagnesemia   . Acute pulmonary edema (HCC)   . Peritonitis (HCC)   . Gastrointestinal  anastomotic leak   . Pleural effusion on left   . Postprocedural intraabdominal abscess 01/15/2017  . Increased band cell count 01/06/2017  . Generalized edema 01/06/2017  . Former smoker 08/08/2016  . Morbid obesity with BMI of 50.0-59.9, adult (HCC) 06/25/2016  . OSA (obstructive sleep apnea) 06/25/2016  . Vitamin D deficiency 06/25/2016  . Type 2 diabetes mellitus without complication, without long-term current use of insulin (HCC) 02/29/2016  . Mixed hyperlipidemia 02/29/2016  . Cellulitis and abscess of neck 02/29/2016  . Cellulitis 12/14/2013  . Morbid obesity (HCC) 12/14/2013  . Neck abscess 12/14/2013  . Hidradenitis 08/09/2012    Past Surgical History:  Procedure Laterality Date  . INCISION AND DRAINAGE ABSCESS Right 12/15/2013   Procedure: INCISION AND DRAINAGE Right Posterior neck ABSCESS;  Surgeon: Cherylynn Ridges, MD;  Location: [facility] OR;  Service: General;  Laterality: Right;  . INCISION AND DRAINAGE DEEP NECK ABSCESS     Hidradenitis - 3-4 times at [facility] and [place]  . IR CATHETER TUBE CHANGE  02/18/2017  . IR RADIOLOGIST EVAL & MGMT  02/11/2017  . IR RADIOLOGIST EVAL & MGMT  03/03/2017  . IR THORACENTESIS ASP PLEURAL SPACE W/IMG GUIDE  01/23/2017        Home Medications    Prior to Admission medications   Medication Sig Start Date End Date Taking? Authorizing Provider  acetaminophen (TYLENOL) 325 MG tablet Take 2 tablets (650 mg total) by mouth every 8 (eight) hours. For pain/fever 06/26/17  Armandina StammerNwoko, Agnes I, NP  gabapentin (NEURONTIN) 300 MG capsule Take 1 capsule (300 mg total) by mouth 3 (three) times daily. For agitation 06/26/17   Armandina StammerNwoko, Agnes I, NP  hydrOXYzine (ATARAX/VISTARIL) 25 MG tablet Take 1 tablet (25 mg total) by mouth every 6 (six) hours as needed for anxiety. 06/26/17   Armandina StammerNwoko, Agnes I, NP  prazosin (MINIPRESS) 1 MG capsule Take 1 capsule (1 mg total) by mouth at bedtime. For nigthmares 06/26/17   Nwoko, Nicole KindredAgnes I, NP  sertraline (ZOLOFT) 50 MG tablet Take 1 tablet  (50 mg total) by mouth daily. For depression 06/27/17   Armandina StammerNwoko, Agnes I, NP  traZODone (DESYREL) 100 MG tablet Take 1 tablet (100 mg total) by mouth at bedtime as needed for sleep. 06/26/17   Sanjuana KavaNwoko, Agnes I, NP    Family History Family History  Problem Relation Age of Onset  . Arthritis Mother   . Hypertension Mother   . Cancer Other   . Heart disease Other     Social History Social History   Tobacco Use  . Smoking status: Former Smoker    Packs/day: 1.00    Last attempt to quit: 11/27/2013    Years since quitting: 4.5  . Smokeless tobacco: Never Used  Substance Use Topics  . Alcohol use: No  . Drug use: No     Allergies   Patient has no known allergies.   Review of Systems Review of Systems  Gastrointestinal: Positive for abdominal pain and vomiting.  Ten systems reviewed and are negative for acute change, except as noted in the HPI.     Physical Exam Updated Vital Signs BP (!) 149/95 (BP Location: Left Arm)   Pulse 68   Temp 98.2 F (36.8 C) (Oral)   Resp 20   SpO2 100%   Physical Exam Vitals signs and nursing note reviewed.  Constitutional:      General: He is not in acute distress.    Appearance: He is well-developed. He is diaphoretic.     Comments: Actively dry heaving, intermittently tearful.  HENT:     Head: Normocephalic and atraumatic.  Eyes:     General: No scleral icterus.    Conjunctiva/sclera: Conjunctivae normal.  Neck:     Musculoskeletal: Normal range of motion.  Cardiovascular:     Rate and Rhythm: Normal rate and regular rhythm.     Pulses: Normal pulses.  Pulmonary:     Effort: Pulmonary effort is normal. No respiratory distress.     Breath sounds: No wheezing or rhonchi.     Comments: Respirations even and unlabored Abdominal:     Palpations: Abdomen is soft. There is no mass.     Tenderness: There is abdominal tenderness. There is guarding. There is no rebound.     Comments: Abdomen soft, obese. There is TTP in the epigastrium and  RUQ. Voluntary guarding in the RUQ. No peritoneal signs.  Musculoskeletal: Normal range of motion.  Skin:    General: Skin is warm.     Coloration: Skin is not pale.     Findings: No erythema or rash.  Neurological:     Mental Status: He is alert and oriented to person, place, and time.     Coordination: Coordination normal.  Psychiatric:        Behavior: Behavior normal.      ED Treatments / Results  Labs (all labs ordered are listed, but only abnormal results are displayed) Labs Reviewed  LIPASE, BLOOD - Abnormal; Notable for the following  components:      Result Value   Lipase 118 (*)    All other components within normal limits  COMPREHENSIVE METABOLIC PANEL - Abnormal; Notable for the following components:   CO2 18 (*)    Glucose, Bld 186 (*)    AST 544 (*)    ALT 297 (*)    Total Bilirubin 1.5 (*)    All other components within normal limits  I-STAT CG4 LACTIC ACID, ED - Abnormal; Notable for the following components:   Lactic Acid, Venous 3.14 (*)    All other components within normal limits  CBC  URINALYSIS, ROUTINE W REFLEX MICROSCOPIC    EKG None  Radiology US Abdomen Complete  Result Date: 06/06/2018 CLINICAL DATA:  Initial evaluation for acute right upper quadrant pain. EXAM: ABDOMEN ULTRASOUND COMPLETE COMPARISON:  Prior CT from earlier the same day. FINDINGS: Gallbladder: Layering sludge seen within the gallbladder lumen. No frank shadowing stones. Gallbladder wall measured at the upper limits of normal at 3 mm. Small amount of free pericholecystic fluid. No sonographic Murphy sign elicited on exam. Common bile duct: Diameter: 3.7 mm Liver: No focal lesion identified. Within normal limits in parenchymal echogenicity. Portal vein is patent on color Doppler imaging with normal direction of blood flow towards the liver. IVC: No abnormality visualized. Pancreas: Not visualized. Spleen: Size and appearance within normal limits. Right Kidney: Length: 11.6 cm.  Echogenicity within normal limits. No mass or hydronephrosis visualized. Left Kidney: Length: 13.3 cm. Echogenicity within normal limits. No mass or hydronephrosis visualized. Abdominal aorta: No aneurysm visualized. Other findings: None. IMPRESSION: 1. Distended gallbladder with internal sludge without frank cholelithiasis, with associated small volume free pericholecystic fluid. No other sonographic features to suggest acute cholecystitis. 2. No biliary dilatation. 3. Otherwise unremarkable abdominal ultrasound. Electronically Signed   By: Rise Mu M.D.   On: 06/06/2018 05:05   Ct Abdomen Pelvis W Contrast  Result Date: 06/06/2018 CLINICAL DATA:  Right abdominal pain and vomiting. History of bariatric surgery. EXAM: CT ABDOMEN AND PELVIS WITH CONTRAST TECHNIQUE: Multidetector CT imaging of the abdomen and pelvis was performed using the standard protocol following bolus administration of intravenous contrast. CONTRAST:  OMNIPAQUE IOHEXOL 300 MG/ML  SOLN COMPARISON:  06/20/2017 CT abdomen/pelvis. FINDINGS: Lower chest: No significant pulmonary nodules or acute consolidative airspace disease. Hepatobiliary: Normal liver size. Hypodense 3.0 x 1.4 cm ovoid collection at the posterior right liver capsule (series 3/image 27), unchanged since 06/20/2017 CT and representing the chronic residua of a much larger fluid collection as seen on 01/15/2017 CT. Subcentimeter hypodense posterior left liver lobe lesion is too small to characterize and is unchanged, considered benign. No new liver lesions. Distended gallbladder. Diffuse gallbladder or wall thickening. No new pericholecystic fluid and fat stranding. No radiopaque cholelithiasis. No biliary ductal dilatation. Pancreas: Normal, with no mass or duct dilation. Spleen: Normal size. No mass. Adrenals/Urinary Tract: Normal adrenals. Punctate nonobstructing lower right renal stone. Nonobstructing 7 mm upper left renal stone. No hydronephrosis. Simple 1.6  cm posterior lower left renal cyst. Additional subcentimeter hypodense renal cortical lesion in the upper right kidney is too small to characterize and requires no follow-up. Normal bladder. Stomach/Bowel: Small hiatal hernia involving the gastric pouch. Otherwise expected postsurgical changes from gastric bypass surgery with intact appearing gastrojejunostomy and with collapsed excluded distal stomach. Normal caliber small bowel with no small bowel wall thickening. Normal appendix. Normal large bowel with no diverticulosis, large bowel wall thickening or pericolonic fat stranding. Vascular/Lymphatic: Atherosclerotic nonaneurysmal abdominal aorta. Patent portal,  splenic, hepatic and renal veins. No pathologically enlarged lymph nodes in the abdomen or pelvis. Reproductive: Normal size prostate. Other: No pneumoperitoneum, ascites or focal fluid collection. Musculoskeletal: No aggressive appearing focal osseous lesions. Mild thoracolumbar spondylosis. IMPRESSION: 1. Distended gallbladder with diffuse gallbladder wall thickening and pericholecystic fluid and fat stranding, suspicious for acute cholecystitis. No radiopaque cholelithiasis. Suggest correlation with right upper quadrant abdominal sonogram. 2. Small hiatal hernia. Otherwise expected postsurgical changes from gastric bypass surgery with no evidence of bowel obstruction or acute bowel inflammation. 3. Nonobstructing bilateral nephrolithiasis. 4.  Aortic Atherosclerosis (ICD10-I70.0). Electronically Signed   By: Delbert PhenixJason A Poff M.D.   On: 06/06/2018 04:56    Procedures Procedures (including critical care time)  Medications Ordered in ED Medications  sodium chloride flush (NS) 0.9 % injection 3 mL (has no administration in time range)  ondansetron (ZOFRAN) injection 4 mg (4 mg Intravenous Given 06/06/18 0359)  HYDROmorphone (DILAUDID) injection 1 mg (1 mg Intravenous Given 06/06/18 0359)  famotidine (PEPCID) IVPB 20 mg premix (0 mg Intravenous Stopped  06/06/18 0429)  sodium chloride 0.9 % bolus 2,000 mL (0 mLs Intravenous Stopped 06/06/18 0500)  dicyclomine (BENTYL) injection 20 mg (20 mg Intramuscular Given 06/06/18 0533)  iohexol (OMNIPAQUE) 300 MG/ML solution 100 mL (100 mLs Intravenous Contrast Given 06/06/18 0423)  HYDROmorphone (DILAUDID) injection 1 mg (1 mg Intravenous Given 06/06/18 0534)    CRITICAL CARE Performed by: Antony MaduraKelly Shaheem Pichon   Total critical care time: 45 minutes  Critical care time was exclusive of separately billable procedures and treating other patients.  Critical care was necessary to treat or prevent imminent or life-threatening deterioration.  Critical care was time spent personally by me on the following activities: development of treatment plan with patient and/or surrogate as well as nursing, discussions with consultants, evaluation of patient's response to treatment, examination of patient, obtaining history from patient or surrogate, ordering and performing treatments and interventions, ordering and review of laboratory studies, ordering and review of radiographic studies, pulse oximetry and re-evaluation of patient's condition.   Initial Impression / Assessment and Plan / ED Course  I have reviewed the triage vital signs and the nursing notes.  Pertinent labs & imaging results that were available during my care of the patient were reviewed by me and considered in my medical decision making (see chart for details).     1104:6400 AM 43 year old male with a history of prior Roux-en-Y/gastric bypass complicated by sepsis due to peritonitis and multiple intra-abdominal abscess as well as pleural effusion s/p chest tube placement presents to the emergency department for sudden onset abdominal pain which began at 2200.  Dry heaving and diaphoretic on arrival.  Vomiting has improved after receiving Zofran.  Noted to be tender in the right upper quadrant on initial exam.  No peritoneal signs noted.  Will obtain CT scan given  prior surgical history.  Ultrasound of the right upper quadrant also ordered for gallbladder evaluation.  5:33 AM Patient with persistent right upper quadrant tenderness with voluntary guarding.  States that his nausea has much improved.  Imaging reviewed.  CT scan with distended gallbladder with gallbladder wall thickening and pericholecystic fluid with fat stranding concerning for acute cholecystitis.  Ultrasound confirms gallbladder distention with gallbladder wall at upper limits of normal.  In light of persistent right upper quadrant tenderness with elevated LFTs, will consult general surgery for admission and management of acute cholecystitis.  Prior surgical site appears reassuring on CT imaging today.  5:49 AM Case discussed with Dr. Cliffton AstersWhite of  general surgery.  They will assess the patient in the ED.  Will start on 2g Rocephin IV pending surgical evaluation.   Final Clinical Impressions(s) / ED Diagnoses   Final diagnoses:  Acute cholecystitis    ED Discharge Orders    None       Antony Madura, PA-C 06/06/18 0552    Antony Madura, PA-C 06/06/18 3888    Dione Booze, MD 06/06/18 [phone]

## 2018-06-07 LAB — COMPREHENSIVE METABOLIC PANEL
ALT: 218 U/L — ABNORMAL HIGH (ref 0–44)
AST: 134 U/L — ABNORMAL HIGH (ref 15–41)
Albumin: 3.1 g/dL — ABNORMAL LOW (ref 3.5–5.0)
Alkaline Phosphatase: 107 U/L (ref 38–126)
Anion gap: 8 (ref 5–15)
BUN: 9 mg/dL (ref 6–20)
CALCIUM: 8.4 mg/dL — AB (ref 8.9–10.3)
CO2: 25 mmol/L (ref 22–32)
CREATININE: 0.87 mg/dL (ref 0.61–1.24)
Chloride: 106 mmol/L (ref 98–111)
GFR calc Af Amer: >60 mL/min (ref >60)
Glucose, Bld: 99 mg/dL (ref 70–99)
Potassium: 4.1 mmol/L (ref 3.5–5.1)
Sodium: 139 mmol/L (ref 135–145)
Total Bilirubin: 0.6 mg/dL (ref 0.3–1.2)
Total Protein: 5.7 g/dL — ABNORMAL LOW (ref 6.5–8.1)

## 2018-06-07 LAB — CBC
HCT: 38.3 % — ABNORMAL LOW (ref 39.0–52.0)
Hemoglobin: 13 g/dL (ref 13.0–17.0)
MCH: 30.9 pg (ref 26.0–34.0)
MCHC: 33.9 g/dL (ref 30.0–36.0)
MCV: 91 fL (ref 80.0–100.0)
Platelets: 151 10*3/uL (ref 150–400)
RBC: 4.21 MIL/uL — ABNORMAL LOW (ref 4.22–5.81)
RDW: 12.9 % (ref 11.5–15.5)
WBC: 3.8 10*3/uL — ABNORMAL LOW (ref 4.0–10.5)
nRBC: 0 % (ref 0.0–0.2)

## 2018-06-07 LAB — LIPASE, BLOOD: Lipase: 30 U/L (ref 11–51)

## 2018-06-07 LAB — HIV ANTIBODY (ROUTINE TESTING W REFLEX): HIV Screen 4th Generation wRfx: NONREACTIVE

## 2018-06-07 NOTE — Progress Notes (Signed)
[facility] Progress Note     Subjective: CC-  Continues to complain of right sided/epigastric abdominal pain. Persistent nausea, no emesis. No improvement in pain since admission. Transaminases trending down, bilirubin and lipase WNL. No leukocytosis, afebrile.  Objective: Vital signs in last 24 hours: Temp:  [98.2 F (36.8 C)-98.6 F (37 C)] 98.2 F (36.8 C) (01/20 0509) Pulse Rate:  [51-60] 60 (01/20 0509) Resp:  [17-19] 18 (01/20 0509) BP: (110-128)/(71-99) 122/80 (01/20 0509) SpO2:  [96 %-100 %] 100 % (01/20 0509) Last BM Date: 06/05/18  Intake/Output from previous day: 01/19 0701 - 01/20 0700 In: 1051.3 [I.V.:889.3; IV Piggyback:162] Out: -  Intake/Output this shift: No intake/output data recorded.  PE: Gen:  Alert, NAD HEENT: EOM's intact, pupils equal and round Card:  RRR Pulm:  CTAB, no W/R/R, effort normal Abd: Soft, ND, +BS, no HSM, mild TTP RUQ and epigastric region without rebound or guarding Psych: A&Ox3  Skin: warm and dry  Lab Results:  Recent Labs    06/06/18 0320 06/07/18 0224  WBC 5.1 3.8*  HGB 14.4 13.0  HCT 43.6 38.3*  PLT 182 151   BMET Recent Labs    06/06/18 0320 06/07/18 0224  NA 136 139  K 4.1 4.1  CL 105 106  CO2 18* 25  GLUCOSE 186* 99  BUN 12 9  CREATININE 0.83 0.87  CALCIUM 9.0 8.4*   PT/INR No results for input(s): LABPROT, INR in the last 72 hours. CMP     Component Value Date/Time   NA 139 06/07/2018 0224   K 4.1 06/07/2018 0224   CL 106 06/07/2018 0224   CO2 25 06/07/2018 0224   GLUCOSE 99 06/07/2018 0224   BUN 9 06/07/2018 0224   CREATININE 0.87 06/07/2018 0224   CALCIUM 8.4 (L) 06/07/2018 0224   PROT 5.7 (L) 06/07/2018 0224   ALBUMIN 3.1 (L) 06/07/2018 0224   AST 134 (H) 06/07/2018 0224   ALT 218 (H) 06/07/2018 0224   ALKPHOS 107 06/07/2018 0224   BILITOT 0.6 06/07/2018 0224   GFRNONAA >60 06/07/2018 0224   GFRAA >60 06/07/2018 0224   Lipase     Component Value Date/Time   LIPASE  30 06/07/2018 0224       Studies/Results: [place] Abdomen Complete  Result Date: 06/06/2018 CLINICAL DATA:  Initial evaluation for acute right upper quadrant pain. EXAM: ABDOMEN ULTRASOUND COMPLETE COMPARISON:  Prior CT from earlier the same day. FINDINGS: Gallbladder: Layering sludge seen within the gallbladder lumen. No frank shadowing stones. Gallbladder wall measured at the upper limits of normal at 3 mm. Small amount of free pericholecystic fluid. No sonographic Murphy sign elicited on exam. Common bile duct: Diameter: 3.7 mm Liver: No focal lesion identified. Within normal limits in parenchymal echogenicity. Portal vein is patent on color Doppler imaging with normal direction of blood flow towards the liver. IVC: No abnormality visualized. Pancreas: Not visualized. Spleen: Size and appearance within normal limits. Right Kidney: Length: 11.6 cm. Echogenicity within normal limits. No mass or hydronephrosis visualized. Left Kidney: Length: 13.3 cm. Echogenicity within normal limits. No mass or hydronephrosis visualized. Abdominal aorta: No aneurysm visualized. Other findings: None. IMPRESSION: 1. Distended gallbladder with internal sludge without frank cholelithiasis, with associated small volume free pericholecystic fluid. No other sonographic features to suggest acute cholecystitis. 2. No biliary dilatation. 3. Otherwise unremarkable abdominal ultrasound. Electronically Signed   By: Rise MuBenjamin  McClintock M.D.   On: 06/06/2018 05:05   Ct Abdomen Pelvis W Contrast  Result Date: 06/06/2018 CLINICAL DATA:  Right abdominal pain and vomiting. History of bariatric surgery. EXAM: CT ABDOMEN AND PELVIS WITH CONTRAST TECHNIQUE: Multidetector CT imaging of the abdomen and pelvis was performed using the standard protocol following bolus administration of intravenous contrast. CONTRAST:  100mL OMNIPAQUE IOHEXOL 300 MG/ML  SOLN COMPARISON:  06/20/2017 CT abdomen/pelvis. FINDINGS: Lower chest: No significant pulmonary  nodules or acute consolidative airspace disease. Hepatobiliary: Normal liver size. Hypodense 3.0 x 1.4 cm ovoid collection at the posterior right liver capsule (series 3/image 27), unchanged since 06/20/2017 CT and representing the chronic residua of a much larger fluid collection as seen on 01/15/2017 CT. Subcentimeter hypodense posterior left liver lobe lesion is too small to characterize and is unchanged, considered benign. No new liver lesions. Distended gallbladder. Diffuse gallbladder or wall thickening. No new pericholecystic fluid and fat stranding. No radiopaque cholelithiasis. No biliary ductal dilatation. Pancreas: Normal, with no mass or duct dilation. Spleen: Normal size. No mass. Adrenals/Urinary Tract: Normal adrenals. Punctate nonobstructing lower right renal stone. Nonobstructing 7 mm upper left renal stone. No hydronephrosis. Simple 1.6 cm posterior lower left renal cyst. Additional subcentimeter hypodense renal cortical lesion in the upper right kidney is too small to characterize and requires no follow-up. Normal bladder. Stomach/Bowel: Small hiatal hernia involving the gastric pouch. Otherwise expected postsurgical changes from gastric bypass surgery with intact appearing gastrojejunostomy and with collapsed excluded distal stomach. Normal caliber small bowel with no small bowel wall thickening. Normal appendix. Normal large bowel with no diverticulosis, large bowel wall thickening or pericolonic fat stranding. Vascular/Lymphatic: Atherosclerotic nonaneurysmal abdominal aorta. Patent portal, splenic, hepatic and renal veins. No pathologically enlarged lymph nodes in the abdomen or pelvis. Reproductive: Normal size prostate. Other: No pneumoperitoneum, ascites or focal fluid collection. Musculoskeletal: No aggressive appearing focal osseous lesions. Mild thoracolumbar spondylosis. IMPRESSION: 1. Distended gallbladder with diffuse gallbladder wall thickening and pericholecystic fluid and fat  stranding, suspicious for acute cholecystitis. No radiopaque cholelithiasis. Suggest correlation with right upper quadrant abdominal sonogram. 2. Small hiatal hernia. Otherwise expected postsurgical changes from gastric bypass surgery with no evidence of bowel obstruction or acute bowel inflammation. 3. Nonobstructing bilateral nephrolithiasis. 4.  Aortic Atherosclerosis (ICD10-I70.0). Electronically Signed   By: Delbert PhenixJason A Poff M.D.   On: 06/06/2018 04:56    Anti-infectives: Anti-infectives (From admission, onward)   Start     Dose/Rate Route Frequency Ordered Stop   06/06/18 0900  piperacillin-tazobactam (ZOSYN) IVPB 3.375 g     3.375 g 12.5 mL/hr over 240 Minutes Intravenous Every 8 hours 06/06/18 0850     06/06/18 0600  cefTRIAXone (ROCEPHIN) 2 g in sodium chloride 0.9 % 100 mL IVPB     2 g 200 mL/hr over 30 Minutes Intravenous  Once 06/06/18 0550 06/06/18 0654       Assessment/Plan HTN HLD DM [facility] Dr. Clent RidgesWalsh, complicated postop course with leak/abscess  Acute cholecystitis Elevated LFTs - Bilirubin and lipase WNL, and transaminases are trending down. Patient likely passed a gallstone. He is still tender on exam so will continue IV antibiotics. Ok for clears as tolerated. Repeat CBC/CMP in AM. Hopefully patient with improve clinically with IV antibiotics and we can set him up to see one of our bariatric surgeons to discuss cholecystectomy. If he becomes worse would plan for cholecystectomy this admission.  ID - zosyn 1/19>> FEN - IVF, CLD, NPO after MN VTE - SCDs, sq heparin Foley - none Follow up - bariatric surgeon   LOS: 1 day    Franne FortsBrooke A Naquita Nappier , PA-C [facility] 06/07/2018, 9:22 AM Pager: [phone] Mon-Thurs 7:00 am-4:30 pm Fri 7:00 am -11:30 AM Sat-Sun 7:00 am-11:30 am

## 2018-06-08 ENCOUNTER — Encounter (HOSPITAL_COMMUNITY): Payer: Self-pay | Admitting: General Practice

## 2018-06-08 ENCOUNTER — Other Ambulatory Visit: Payer: Self-pay

## 2018-06-08 LAB — COMPREHENSIVE METABOLIC PANEL
ALBUMIN: 3.2 g/dL — AB (ref 3.5–5.0)
ALT: 138 U/L — ABNORMAL HIGH (ref 0–44)
ANION GAP: 5 (ref 5–15)
AST: 47 U/L — ABNORMAL HIGH (ref 15–41)
Alkaline Phosphatase: 97 U/L (ref 38–126)
BILIRUBIN TOTAL: 0.5 mg/dL (ref 0.3–1.2)
BUN: 7 mg/dL (ref 6–20)
CO2: 29 mmol/L (ref 22–32)
Calcium: 8.6 mg/dL — ABNORMAL LOW (ref 8.9–10.3)
Chloride: 105 mmol/L (ref 98–111)
Creatinine, Ser: 1.03 mg/dL (ref 0.61–1.24)
GFR calc Af Amer: >60 mL/min (ref >60)
GFR calc non Af Amer: >60 mL/min (ref >60)
Glucose, Bld: 81 mg/dL (ref 70–99)
Potassium: 3.9 mmol/L (ref 3.5–5.1)
Sodium: 139 mmol/L (ref 135–145)
Total Protein: 5.7 g/dL — ABNORMAL LOW (ref 6.5–8.1)

## 2018-06-08 LAB — CBC
HCT: 38.6 % — ABNORMAL LOW (ref 39.0–52.0)
Hemoglobin: 12.8 g/dL — ABNORMAL LOW (ref 13.0–17.0)
MCH: 29.8 pg (ref 26.0–34.0)
MCHC: 33.2 g/dL (ref 30.0–36.0)
MCV: 89.8 fL (ref 80.0–100.0)
Platelets: 144 10*3/uL — ABNORMAL LOW (ref 150–400)
RBC: 4.3 MIL/uL (ref 4.22–5.81)
RDW: 12.4 % (ref 11.5–15.5)
WBC: 3.9 10*3/uL — ABNORMAL LOW (ref 4.0–10.5)
nRBC: 0 % (ref 0.0–0.2)

## 2018-06-08 MED ORDER — HYDROMORPHONE HCL 1 MG/ML IJ SOLN
1.0000 mg | INTRAMUSCULAR | Status: DC | PRN
  Administered 2018-06-08 – 2018-06-09 (×5): 1 mg via INTRAVENOUS
  Filled 2018-06-08 (×6): qty 1

## 2018-06-08 NOTE — Progress Notes (Signed)
[facility] Progress Note     Subjective: CC-  Patient states that he continues to have RUQ pain, no better. He took 7 doses of dilaudid yesterday. He also states that he's starving, and asking when he can go home. Some nausea, no emesis. Tolerating clear liquids.  Objective: Vital signs in last 24 hours: Temp:  [97.7 F (36.5 C)-98 F (36.7 C)] 97.7 F (36.5 C) (01/21 0517) Pulse Rate:  [48-54] 48 (01/21 0517) Resp:  [16-17] 16 (01/21 0517) BP: (122-126)/(72-77) 123/77 (01/21 0517) SpO2:  [98 %-100 %] 98 % (01/21 0517) Last BM Date: 06/05/18  Intake/Output from previous day: 01/20 0701 - 01/21 0700 In: 2099.2 [P.O.:360; I.V.:1739.2] Out: -  Intake/Output this shift: No intake/output data recorded.  PE: Gen:  Alert, NAD HEENT: EOM's intact, pupils equal and round Card:  RRR Pulm:  CTAB, no W/R/R, effort normal Abd: Soft, ND, +BS, no HSM, mild TTP RUQ and epigastric region without rebound or guarding Psych: A&Ox3  Skin: warm and dry   Lab Results:  Recent Labs    06/07/18 0224 06/08/18 0425  WBC 3.8* 3.9*  HGB 13.0 12.8*  HCT 38.3* 38.6*  PLT 151 144*   BMET Recent Labs    06/07/18 0224 06/08/18 0425  NA 139 139  K 4.1 3.9  CL 106 105  CO2 25 29  GLUCOSE 99 81  BUN 9 7  CREATININE 0.87 1.03  CALCIUM 8.4* 8.6*   PT/INR No results for input(s): LABPROT, INR in the last 72 hours. CMP     Component Value Date/Time   NA 139 06/08/2018 0425   K 3.9 06/08/2018 0425   CL 105 06/08/2018 0425   CO2 29 06/08/2018 0425   GLUCOSE 81 06/08/2018 0425   BUN 7 06/08/2018 0425   CREATININE 1.03 06/08/2018 0425   CALCIUM 8.6 (L) 06/08/2018 0425   PROT 5.7 (L) 06/08/2018 0425   ALBUMIN 3.2 (L) 06/08/2018 0425   AST 47 (H) 06/08/2018 0425   ALT 138 (H) 06/08/2018 0425   ALKPHOS 97 06/08/2018 0425   BILITOT 0.5 06/08/2018 0425   GFRNONAA >60 06/08/2018 0425   GFRAA >60 06/08/2018 0425   Lipase     Component Value Date/Time   LIPASE 30  06/07/2018 0224       Studies/Results: No results found.  Anti-infectives: Anti-infectives (From admission, onward)   Start     Dose/Rate Route Frequency Ordered Stop   06/06/18 0900  piperacillin-tazobactam (ZOSYN) IVPB 3.375 g     3.375 g 12.5 mL/hr over 240 Minutes Intravenous Every 8 hours 06/06/18 0850     06/06/18 0600  cefTRIAXone (ROCEPHIN) 2 g in sodium chloride 0.9 % 100 mL IVPB     2 g 200 mL/hr over 30 Minutes Intravenous  Once 06/06/18 0550 06/06/18 0654       Assessment/Plan HTN HLD DM H/o RYGB at [facility] 11/2016 Dr. Clent Ridges, complicated postop course with leak/abscess  Acute cholecystitis Elevated LFTs - Bilirubin is WNL and transaminases are trending down. No leukocytosis or fever but he continues to have RUQ pain requiring IV dilaudid. Ok for full liquids today. continue IV zosyn. Repeat labs in AM. Still hopeful that he will clinically improve and we can arrange for outpatient appointment with bariatric surgeons to discuss cholecystectomy.   ID - zosyn 1/19>> FEN - IVF, CLD, NPO after MN VTE - SCDs, sq heparin Foley - none Follow up - [facility]   LOS: 2 days    Franne Forts ,  [facility] 06/08/2018, 9:03 AM Pager: [phone] Mon-Thurs 7:00 am-4:30 pm Fri 7:00 am -11:30 AM Sat-Sun 7:00 am-11:30 am

## 2018-06-09 ENCOUNTER — Encounter (HOSPITAL_COMMUNITY): Admission: EM | Disposition: A | Discharge status: Home / Self Care | Payer: Self-pay | Source: Home / Self Care

## 2018-06-09 ENCOUNTER — Encounter (HOSPITAL_COMMUNITY): Payer: Self-pay | Admitting: Surgery

## 2018-06-09 ENCOUNTER — Inpatient Hospital Stay (HOSPITAL_COMMUNITY): Payer: 59

## 2018-06-09 ENCOUNTER — Inpatient Hospital Stay (HOSPITAL_COMMUNITY): Payer: 59 | Admitting: Anesthesiology

## 2018-06-09 HISTORY — PX: CHOLECYSTECTOMY: SHX55

## 2018-06-09 HISTORY — PX: LAPAROSCOPIC LYSIS OF ADHESIONS: SHX5905

## 2018-06-09 LAB — COMPREHENSIVE METABOLIC PANEL
ALT: 99 U/L — ABNORMAL HIGH (ref 0–44)
AST: 25 U/L (ref 15–41)
Albumin: 3.2 g/dL — ABNORMAL LOW (ref 3.5–5.0)
Alkaline Phosphatase: 87 U/L (ref 38–126)
Anion gap: 6 (ref 5–15)
BUN: 5 mg/dL — ABNORMAL LOW (ref 6–20)
CO2: 28 mmol/L (ref 22–32)
Calcium: 8.6 mg/dL — ABNORMAL LOW (ref 8.9–10.3)
Chloride: 106 mmol/L (ref 98–111)
Creatinine, Ser: 0.96 mg/dL (ref 0.61–1.24)
GFR calc Af Amer: >60 mL/min (ref >60)
GFR calc non Af Amer: >60 mL/min (ref >60)
Glucose, Bld: 85 mg/dL (ref 70–99)
Potassium: 3.8 mmol/L (ref 3.5–5.1)
Sodium: 140 mmol/L (ref 135–145)
Total Bilirubin: 0.6 mg/dL (ref 0.3–1.2)
Total Protein: 5.7 g/dL — ABNORMAL LOW (ref 6.5–8.1)

## 2018-06-09 LAB — CBC
HCT: 38.4 % — ABNORMAL LOW (ref 39.0–52.0)
Hemoglobin: 13 g/dL (ref 13.0–17.0)
MCH: 30.2 pg (ref 26.0–34.0)
MCHC: 33.9 g/dL (ref 30.0–36.0)
MCV: 89.1 fL (ref 80.0–100.0)
Platelets: 156 10*3/uL (ref 150–400)
RBC: 4.31 MIL/uL (ref 4.22–5.81)
RDW: 12.3 % (ref 11.5–15.5)
WBC: 4.3 10*3/uL (ref 4.0–10.5)
nRBC: 0 % (ref 0.0–0.2)

## 2018-06-09 LAB — GLUCOSE, CAPILLARY
Glucose-Capillary: 86 mg/dL (ref 70–99)
Glucose-Capillary: 102 mg/dL — ABNORMAL HIGH (ref 70–99)

## 2018-06-09 SURGERY — LAPAROSCOPIC CHOLECYSTECTOMY WITH INTRAOPERATIVE CHOLANGIOGRAM
Anesthesia: General

## 2018-06-09 MED ORDER — FENTANYL CITRATE (PF) 100 MCG/2ML IJ SOLN
INTRAMUSCULAR | Status: AC
  Filled 2018-06-09: qty 2

## 2018-06-09 MED ORDER — OXYCODONE HCL 5 MG PO TABS: 5.0000 mg | ORAL_TABLET | ORAL | Status: DC | PRN

## 2018-06-09 MED ORDER — DEXAMETHASONE SODIUM PHOSPHATE 10 MG/ML IJ SOLN
INTRAMUSCULAR | Status: DC | PRN
  Administered 2018-06-09: 8 mg via INTRAVENOUS

## 2018-06-09 MED ORDER — IOPAMIDOL (ISOVUE-300) INJECTION 61%
INTRAVENOUS | Status: AC
  Filled 2018-06-09: qty 50

## 2018-06-09 MED ORDER — ONDANSETRON HCL 4 MG/2ML IJ SOLN
INTRAMUSCULAR | Status: AC
  Filled 2018-06-09: qty 2

## 2018-06-09 MED ORDER — PROMETHAZINE HCL 25 MG/ML IJ SOLN: 6.2500 mg | INTRAMUSCULAR | Status: DC | PRN

## 2018-06-09 MED ORDER — MIDAZOLAM HCL 5 MG/5ML IJ SOLN
INTRAMUSCULAR | Status: DC | PRN
  Administered 2018-06-09: 2 mg via INTRAVENOUS

## 2018-06-09 MED ORDER — LIDOCAINE 2% (20 MG/ML) 5 ML SYRINGE
INTRAMUSCULAR | Status: AC
  Filled 2018-06-09: qty 5

## 2018-06-09 MED ORDER — LIDOCAINE 20MG/ML (2%) 15 ML SYRINGE OPTIME
INTRAMUSCULAR | Status: DC | PRN
  Administered 2018-06-09: 100 mg via INTRAVENOUS

## 2018-06-09 MED ORDER — OXYCODONE HCL 5 MG PO TABS
ORAL_TABLET | ORAL | Status: AC
  Filled 2018-06-09: qty 1

## 2018-06-09 MED ORDER — ROCURONIUM BROMIDE 10 MG/ML (PF) SYRINGE
PREFILLED_SYRINGE | INTRAVENOUS | Status: DC | PRN
  Administered 2018-06-09 (×2): 20 mg via INTRAVENOUS
  Administered 2018-06-09: 50 mg via INTRAVENOUS

## 2018-06-09 MED ORDER — FENTANYL CITRATE (PF) 250 MCG/5ML IJ SOLN
INTRAMUSCULAR | Status: AC
  Filled 2018-06-09: qty 5

## 2018-06-09 MED ORDER — FENTANYL CITRATE (PF) 100 MCG/2ML IJ SOLN
25.0000 ug | INTRAMUSCULAR | Status: DC | PRN
  Administered 2018-06-09 (×3): 50 ug via INTRAVENOUS

## 2018-06-09 MED ORDER — OXYCODONE HCL 5 MG PO TABS
5.0000 mg | ORAL_TABLET | ORAL | Status: DC | PRN
  Administered 2018-06-09 – 2018-06-10 (×6): 5 mg via ORAL
  Filled 2018-06-09 (×5): qty 1

## 2018-06-09 MED ORDER — BUPIVACAINE-EPINEPHRINE 0.25% -1:200000 IJ SOLN
INTRAMUSCULAR | Status: DC | PRN
  Administered 2018-06-09: 10 mL

## 2018-06-09 MED ORDER — 0.9 % SODIUM CHLORIDE (POUR BTL) OPTIME
TOPICAL | Status: DC | PRN
  Administered 2018-06-09: 1000 mL

## 2018-06-09 MED ORDER — FENTANYL CITRATE (PF) 100 MCG/2ML IJ SOLN
INTRAMUSCULAR | Status: DC | PRN
  Administered 2018-06-09 (×2): 50 ug via INTRAVENOUS
  Administered 2018-06-09: 100 ug via INTRAVENOUS
  Administered 2018-06-09: 50 ug via INTRAVENOUS

## 2018-06-09 MED ORDER — PROPOFOL 10 MG/ML IV BOLUS
INTRAVENOUS | Status: AC
  Filled 2018-06-09: qty 20

## 2018-06-09 MED ORDER — SUGAMMADEX SODIUM 200 MG/2ML IV SOLN
INTRAVENOUS | Status: DC | PRN
  Administered 2018-06-09: 250 mg via INTRAVENOUS

## 2018-06-09 MED ORDER — SODIUM CHLORIDE 0.9 % IR SOLN
Status: DC | PRN
  Administered 2018-06-09: 1

## 2018-06-09 MED ORDER — METHOCARBAMOL 500 MG PO TABS
500.0000 mg | ORAL_TABLET | Freq: Three times a day (TID) | ORAL | Status: DC | PRN
  Administered 2018-06-09 – 2018-06-10 (×3): 500 mg via ORAL
  Filled 2018-06-09 (×2): qty 1

## 2018-06-09 MED ORDER — HYDROMORPHONE HCL 1 MG/ML IJ SOLN
1.0000 mg | INTRAMUSCULAR | Status: DC | PRN
  Administered 2018-06-09: 1 mg via INTRAVENOUS

## 2018-06-09 MED ORDER — SODIUM CHLORIDE 0.9 % IV SOLN
INTRAVENOUS | Status: DC | PRN
  Administered 2018-06-09: 25 mL

## 2018-06-09 MED ORDER — MIDAZOLAM HCL 2 MG/2ML IJ SOLN
INTRAMUSCULAR | Status: AC
  Filled 2018-06-09: qty 2

## 2018-06-09 MED ORDER — METHOCARBAMOL 500 MG PO TABS
ORAL_TABLET | ORAL | Status: AC
  Filled 2018-06-09: qty 1

## 2018-06-09 MED ORDER — HYDROMORPHONE HCL 1 MG/ML IJ SOLN
1.0000 mg | INTRAMUSCULAR | Status: DC | PRN
  Administered 2018-06-09 – 2018-06-10 (×3): 1 mg via INTRAVENOUS
  Filled 2018-06-09 (×3): qty 1

## 2018-06-09 MED ORDER — BUPIVACAINE-EPINEPHRINE 0.25% -1:200000 IJ SOLN
INTRAMUSCULAR | Status: AC
  Filled 2018-06-09: qty 1

## 2018-06-09 MED ORDER — PROPOFOL 10 MG/ML IV BOLUS
INTRAVENOUS | Status: DC | PRN
  Administered 2018-06-09: 200 mg via INTRAVENOUS

## 2018-06-09 MED ORDER — LACTATED RINGERS IV SOLN
INTRAVENOUS | Status: DC
  Administered 2018-06-09: 18:00:00 via INTRAVENOUS

## 2018-06-09 SURGICAL SUPPLY — 40 items
APPLIER CLIP 5 13 M/L LIGAMAX5 (MISCELLANEOUS) ×3
BLADE CLIPPER SURG (BLADE) ×3 IMPLANT
CANISTER SUCT 3000ML PPV (MISCELLANEOUS) ×3 IMPLANT
CHLORAPREP W/TINT 26ML (MISCELLANEOUS) ×3 IMPLANT
CLIP APPLIE 5 13 M/L LIGAMAX5 (MISCELLANEOUS) ×1 IMPLANT
COVER MAYO STAND STRL (DRAPES) ×3 IMPLANT
COVER SURGICAL LIGHT HANDLE (MISCELLANEOUS) ×3 IMPLANT
COVER WAND RF STERILE (DRAPES) IMPLANT
DERMABOND ADVANCED (GAUZE/BANDAGES/DRESSINGS) ×2
DERMABOND ADVANCED .7 DNX12 (GAUZE/BANDAGES/DRESSINGS) ×1 IMPLANT
DISSECTOR BLUNT TIP ENDO 5MM (MISCELLANEOUS) IMPLANT
DRAPE C-ARM 42X72 X-RAY (DRAPES) ×3 IMPLANT
ELECT CAUTERY BLADE 6.4 (BLADE) ×3 IMPLANT
ELECT REM PT RETURN 9FT ADLT (ELECTROSURGICAL) ×3
ELECTRODE REM PT RTRN 9FT ADLT (ELECTROSURGICAL) ×1 IMPLANT
GLOVE BIO SURGEON STRL SZ7.5 (GLOVE) ×3 IMPLANT
GLOVE INDICATOR 8.0 STRL GRN (GLOVE) ×3 IMPLANT
GOWN STRL REUS W/ TWL LRG LVL3 (GOWN DISPOSABLE) ×2 IMPLANT
GOWN STRL REUS W/ TWL XL LVL3 (GOWN DISPOSABLE) ×2 IMPLANT
GOWN STRL REUS W/TWL LRG LVL3 (GOWN DISPOSABLE) ×4
GOWN STRL REUS W/TWL XL LVL3 (GOWN DISPOSABLE) ×4
KIT BASIN OR (CUSTOM PROCEDURE TRAY) ×3 IMPLANT
KIT TURNOVER KIT B (KITS) ×3 IMPLANT
NS IRRIG 1000ML POUR BTL (IV SOLUTION) ×3 IMPLANT
PAD ARMBOARD 7.5X6 YLW CONV (MISCELLANEOUS) ×3 IMPLANT
PENCIL BUTTON HOLSTER BLD 10FT (ELECTRODE) ×3 IMPLANT
POUCH SPECIMEN RETRIEVAL 10MM (ENDOMECHANICALS) ×3 IMPLANT
SCISSORS LAP 5X35 DISP (ENDOMECHANICALS) ×3 IMPLANT
SET CHOLANGIOGRAPH 5 50 .035 (SET/KITS/TRAYS/PACK) ×3 IMPLANT
SET IRRIG TUBING LAPAROSCOPIC (IRRIGATION / IRRIGATOR) ×3 IMPLANT
SET TUBE SMOKE EVAC HIGH FLOW (TUBING) ×3 IMPLANT
SLEEVE ENDOPATH XCEL 5M (ENDOMECHANICALS) ×6 IMPLANT
SPECIMEN JAR SMALL (MISCELLANEOUS) ×3 IMPLANT
SUT MNCRL AB 4-0 PS2 18 (SUTURE) ×3 IMPLANT
TOWEL OR 17X24 6PK STRL BLUE (TOWEL DISPOSABLE) ×3 IMPLANT
TOWEL OR 17X26 10 PK STRL BLUE (TOWEL DISPOSABLE) IMPLANT
TRAY LAPAROSCOPIC MC (CUSTOM PROCEDURE TRAY) ×3 IMPLANT
TROCAR XCEL BLUNT TIP 100MML (ENDOMECHANICALS) ×3 IMPLANT
TROCAR XCEL NON-BLD 5MMX100MML (ENDOMECHANICALS) ×3 IMPLANT
WATER STERILE IRR 1000ML POUR (IV SOLUTION) ×3 IMPLANT

## 2018-06-09 NOTE — Transfer of Care (Signed)
Immediate Anesthesia Transfer of Care Note  Patient: Albert White  Procedure(s) Performed: LAPAROSCOPIC CHOLECYSTECTOMY WITH   INTRAOPERATIVE CHOLANGIOGRAM, (N/A ) LAPAROSCOPIC LYSIS OF ADHESIONS (N/A )  Patient Location: PACU  Anesthesia Type:General  Level of Consciousness: awake, alert , oriented and patient cooperative  Airway & Oxygen Therapy: Patient Spontanous Breathing and Patient connected to nasal cannula oxygen  Post-op Assessment: Report given to RN and Post -op Vital signs reviewed and stable  Post vital signs: Reviewed and stable  Last Vitals:  Vitals Value Taken Time  BP 127/61 06/09/2018  2:47 PM  Temp    Pulse 85 06/09/2018  2:48 PM  Resp 16 06/09/2018  2:48 PM  SpO2 99 % 06/09/2018  2:48 PM  Vitals shown include unvalidated device data.  Last Pain:  Vitals:   06/09/18 1110  TempSrc:   PainSc: 5       Patients Stated Pain Goal: 3 (06/09/18 0758)  Complications: No apparent anesthesia complications

## 2018-06-09 NOTE — Anesthesia Preprocedure Evaluation (Signed)
Anesthesia Evaluation  Patient identified by MRN, date of birth, ID band Patient awake    Reviewed: Allergy & Precautions, NPO status , Patient's Chart, lab work & pertinent test results  Airway Mallampati: II  TM Distance: >3 FB Neck ROM: Full    Dental  (+) Dental Advisory Given, Missing   Pulmonary sleep apnea , former smoker,    Pulmonary exam normal breath sounds clear to auscultation       Cardiovascular negative cardio ROS Normal cardiovascular exam Rhythm:Regular Rate:Normal     Neuro/Psych PSYCHIATRIC DISORDERS Depression negative neurological ROS     GI/Hepatic Neg liver ROS, S/p gastric bypass    Endo/Other  diabetesObesity   Renal/GU negative Renal ROS     Musculoskeletal negative musculoskeletal ROS (+)   Abdominal   Peds  Hematology negative hematology ROS (+)   Anesthesia Other Findings Day of surgery medications reviewed with the patient.  Reproductive/Obstetrics                             Anesthesia Physical Anesthesia Plan  ASA: II  Anesthesia Plan: General   Post-op Pain Management:    Induction: Intravenous  PONV Risk Score and Plan: 4 or greater and Midazolam, Dexamethasone, Ondansetron and Diphenhydramine  Airway Management Planned: Oral ETT  Additional Equipment:   Intra-op Plan:   Post-operative Plan: Extubation in OR  Informed Consent: I have reviewed the patients History and Physical, chart, labs and discussed the procedure including the risks, benefits and alternatives for the proposed anesthesia with the patient or authorized representative who has indicated his/her understanding and acceptance.     Dental advisory given  Plan Discussed with: CRNA  Anesthesia Plan Comments:         Anesthesia Quick Evaluation

## 2018-06-09 NOTE — Progress Notes (Signed)
-  I have discussed the case with one of my bariatric surgery partners Dr. Sheliah Hatch whom is available to assist with surgery today -I discussed this with the patient and he is comfortable proceeding with surgery -We discussed the anatomy and physiology of the hepatobiliary system. The pathophysiology of gallbladder disease was discussed at length as well. -We discussed cholecystectomy - both laparoscopic and potentially open techniques, possible intraoperative cholangiogram. -The options for treatment were discussed including ongoing observation which may result in subsequent gallbladder complications (persistent infection, pancreatitis, choledocholithiasis, etc). -He has opted to undergo surgery to remove the gallbladder -The planned procedure, material risks (including, but not limited to, pain, bleeding, infection, scarring, need for blood transfusion, damage to surrounding structures- blood vessels/nerves/viscus/organs especially in light of his surgical history of bypass and subsequent leak; we discussed possible damage to bile duct, bile leak, need for additional procedures, hernia, worsening of pre-existing medical conditions, pancreatitis, pneumonia, heart attack, stroke, death) benefits and alternatives to surgery were discussed at length. The patient's questions were answered to his satisfaction, he voiced understanding and elected to proceed with surgery. Additionally, we discussed typical postoperative expectations and the recovery process.

## 2018-06-09 NOTE — Op Note (Signed)
06/06/2018 - 06/09/2018 2:33 PM  PATIENT: Albert White  43 y.o. male  Patient Care Team: Alysia Penna, MD as PCP - General (Internal Medicine)  PRE-OPERATIVE DIAGNOSIS: Choledocholithiasis, cholecystitis  POST-OPERATIVE DIAGNOSIS: Acute/subacute cholecystitis  PROCEDURE:  1. Laparoscopic cholecystectomy with intraoperative cholangiogram 2. Laparoscopic lysis of adhesions x 45 minutes  SURGEON: Stephanie Coup. Cliffton Asters, MD  ASSISTANT: Feliciana Rossetti, MD   ANESTHESIA: General endotracheal  EBL: No intake/output data recorded.  DRAINS: None  SPECIMEN: Gallbladder  COUNTS: Sponge, needle and instrument counts were reported correct x2 at the conclusion of the operation  DISPOSITION: PACU in satisfactory condition  COMPLICATIONS: None  FINDINGS: Significant amount of adhesions likely related to prior procedure and subsequent leak.  These were predominantly omentum to abdominal wall and omentum to liver.  These were taken down sharply.  The gallbladder was ultimately identified and found to be edematous and thickened consistent with cholecystitis.  Laparoscopic cholecystectomy performed and IOC demonstrated appropriate filling of the cystic duct, common hepatic, right hepatic, left hepatic ducts, distal common bile duct and duodenum.  There were no filling defects.  INDICATION: Albert White is a 43 year old gentleman with a history of hypertension, hyperlipidemia, diabetes who presented to the emergency room 06/06/2018 with a 1 day history of right upper quadrant pain.  He has a history of laparoscopic Roux-en-Y gastric bypass done at [facility] in [place] which was complicated postoperatively by a significant leak with multiple upper quadrant abscesses.  He had a relatively protracted course and had been managed postoperatively at our facility.  He had been seen and evaluated this admission -he was tender in the right upper quadrant.  Labs demonstrated a normal Albert White blood cell  count but elevated transaminases and a total bilirubin of 1.5.  He underwent right upper quadrant ultrasound which demonstrated a distended gallbladder with internal sludge without frank gallstones, associated small volume free pericholecystic fluid.  No other sonographic features to suggest acute cholecystitis.  CT abdomen pelvis demonstrated a distended gallbladder with diffuse gallbladder wall thickening and pericholecystic fluid and fat stranding, suspicious for acute cholecystitis.  He was admitted to the hospital and started on IV antibiotics.  His liver enzymes and total bilirubin subsequently trended down towards normal.  His bilirubin normalized.  He was observed and on hospital day 2 was noted to have substantial improvement in his abdominal pain.  Today, he began having recurrent right-sided abdominal pain that was described to be severe.  Given his previous findings and now 3 days of antibiotics without dramatic improvement, options were discussed.  He opted to undergo surgery.  Please refer to notes elsewhere in the chart for details regarding this discussion  DESCRIPTION:   The patient was identified & brought into the operating room. The patient was positioned supine on the OR table. SCDs were in place and active during the entire case. The patient underwent general endotracheal anesthesia. Pressure points were then padded. The abdomen was prepped and draped in the standard sterile fashion and antibiotics were administered. A surgical timeout was performed and confirmed our plan.   A supraumbilical incision was made. The umbilical stalk was grasped and retracted outwardly. The supraumbilical fascia was identified and incised. The peritoneal cavity was gently entered bluntly. A purse-string 0 Vicryl suture was placed. The Hasson cannula was inserted into the peritoneal cavity and insufflation with CO2 commenced to . A laparoscope was inserted into the peritoneal cavity and inspection  confirmed no evidence of trocar site complications. The patient was then positioned in reverse Trendelenburg  with the left side down.  There were significant adhesions from the omentum to the abdominal wall in the upper quadrants.  A 5 mm subxiphoid trocar was able to be placed away from the adhesions.  The adhesions were then carefully taken down sharply with scissors.  At this point, an additional 5 mm trocar was able to be placed in the midclavicular line.  Additionally he is a lysis then commenced taking down more omentum off of the abdominal wall as well as the liver.  Ultimately, the gallbladder was able to be visualized.  Additionally he is were taken down inferiorly at which point a 5 mm trocar was able to be placed in the right lateral abdomen.  Adhesions on the liver to the abdominal wall were also taken down so as to facilitate gallbladder retraction.  The gallbladder was identified and appeared inflamed with a thickened wall. The gallbladder fundus was grasped and elevated cephalad. An additional grasper was then placed on the infundibulum of the gallbladder and the infundibulum was retracted laterally. Gentle blunt dissection was then employed with a Art gallery manager[place] dissector working down into ComcastCalot's triangle. The peritoneum on both sides of the gallbladder was opened with hook cautery. The cystic duct was identified, carefully circumferentially dissected. The cystic artery was then carefully circumferentially dissected. The space between the cystic artery and hepatocystic plate was developed such that the liver could be seen through a window medial to the cystic artery. The triangle of Calot was cleared of all fibrofatty tissue. At this point, a critical view of safety was achieved and the only structures visualized was the skeletonized cystic duct laterally, the skeletonized cystic artery and the liver through the window medial to the artery.   At this point attention was turned to performing a  cholangiogram.  A clip was applied at the base of the gallbladder on the cystic duct.  The cystic artery was also clipped with 2 clips on the "stay" side and 1 clip on the gallbladder side.  A partial cystic duct-otomy was created. A 68F cholangiocatheter was then introduced through a puncture site at the right subcostal ridge of the abdominal wall and directed it into the cystic duct. This was then secured with a clip. A cholangiogram was then obtained using a dilute radio-opaque contrast and continuous fluoroscopy. Contrast flowed from a side branch consistent with cystic duct cannulization. Contrast flowed proximally up the common hepatic duct into the right and left intrahepatic chains out to secondary radicals. Contrast flowed distally into the common bile duct and easily across the ampulla into the duodenum without filling defect.  This was consistent with a normal cholangiogram.  The abdomen was then reinsufflated and the laparoscope were introduced.  The gallbladder was regrasped.  The clip securing the cholangiocatheter was removed.  The cholangiocatheter was then removed.    The cystic duct was clipped with 2 clips on the "stay" side and 1 clip on the specimen side. The cystic duct and artery were then divided. The gallbladder was then freed from its remaining attachments to the liver using electrocautery and placed into an endocatch bag. Hemostasis was achieved and then re-verified. The rest of the abdomen was inspected no injury nor bleeding elsewhere was identified. The RUQ was irrigated with normal saline. The clips and gallbladder fossa were reinspected and noted to be in place and hemostatic.  The gallbladder and endocatch bag was then removed from the umbilical port site and passed off as specimen. The RUQ ports were removed under direct  visualization and noted to be hemostatic. The umbilical fascia was then closed using 0 Vicryl suture. The skin of all incision sites was approximated with 4-0  monocryl subcuticular suture and dermabond applied. The patient was then extubated and transferred to a stretcher for transport to PACU in satisfactory condition.

## 2018-06-09 NOTE — Anesthesia Postprocedure Evaluation (Signed)
Anesthesia Post Note  Patient: Albert White  Procedure(s) Performed: LAPAROSCOPIC CHOLECYSTECTOMY WITH   INTRAOPERATIVE CHOLANGIOGRAM, (N/A ) LAPAROSCOPIC LYSIS OF ADHESIONS (N/A )     Patient location during evaluation: PACU Anesthesia Type: General Level of consciousness: awake and alert Pain management: pain level controlled Vital Signs Assessment: post-procedure vital signs reviewed and stable Respiratory status: spontaneous breathing, nonlabored ventilation and respiratory function stable Cardiovascular status: blood pressure returned to baseline and stable Postop Assessment: no apparent nausea or vomiting Anesthetic complications: no    Last Vitals:  Vitals:   06/09/18 1517 06/09/18 1532  BP: 130/67 134/74  Pulse: 69 77  Resp: (!) 9 18  Temp:  36.7 C  SpO2: 97% 98%    Last Pain:  Vitals:   06/09/18 1532  TempSrc:   PainSc: 4                  Beryle Lathe

## 2018-06-09 NOTE — Progress Notes (Signed)
[facility] Progress Note     Subjective: CC-  Patient states that he did well yesterday, but is having more RUQ pain this morning. Denies n/v or acid reflux. Tolerating full liquids. PO intake does not make his pain worse. Describes the pain as dull and intermittently sharp. BM yesterday.   Objective: Vital signs in last 24 hours: Temp:  [97.7 F (36.5 C)-98 F (36.7 C)] 97.7 F (36.5 C) (01/22 0521) Pulse Rate:  [45-51] 45 (01/22 0521) Resp:  [16] 16 (01/21 2117) BP: (125-128)/(77-81) 128/77 (01/22 0521) SpO2:  [98 %-100 %] 98 % (01/22 0521) Weight:  [106.1 kg] 106.1 kg (01/21 0900) Last BM Date: 06/08/18  Intake/Output from previous day: 01/21 0701 - 01/22 0700 In: 1185.8 [I.V.:1098.4; IV Piggyback:87.4] Out: -  Intake/Output this shift: No intake/output data recorded.  PE: Gen: Alert, NAD HEENT: EOM's intact, pupils equal and round Card: RRR Pulm: CTAB, no W/R/R, effort normal Abd: Soft, ND, +BS, no HSM,mild TTP RUQ and epigastric region without rebound or guarding Psych: A&Ox3  Skin: warm and dry    Lab Results:  Recent Labs    06/08/18 0425 06/09/18 0327  WBC 3.9* 4.3  HGB 12.8* 13.0  HCT 38.6* 38.4*  PLT 144* 156   BMET Recent Labs    06/08/18 0425 06/09/18 0327  NA 139 140  K 3.9 3.8  CL 105 106  CO2 29 28  GLUCOSE 81 85  BUN 7 5*  CREATININE 1.03 0.96  CALCIUM 8.6* 8.6*   PT/INR No results for input(s): LABPROT, INR in the last 72 hours. CMP     Component Value Date/Time   NA 140 06/09/2018 0327   K 3.8 06/09/2018 0327   CL 106 06/09/2018 0327   CO2 28 06/09/2018 0327   GLUCOSE 85 06/09/2018 0327   BUN 5 (L) 06/09/2018 0327   CREATININE 0.96 06/09/2018 0327   CALCIUM 8.6 (L) 06/09/2018 0327   PROT 5.7 (L) 06/09/2018 0327   ALBUMIN 3.2 (L) 06/09/2018 0327   AST 25 06/09/2018 0327   ALT 99 (H) 06/09/2018 0327   ALKPHOS 87 06/09/2018 0327   BILITOT 0.6 06/09/2018 0327   GFRNONAA >60 06/09/2018 0327   GFRAA >60  06/09/2018 0327   Lipase     Component Value Date/Time   LIPASE 30 06/07/2018 0224       Studies/Results: No results found.  Anti-infectives: Anti-infectives (From admission, onward)   Start     Dose/Rate Route Frequency Ordered Stop   06/06/18 0900  piperacillin-tazobactam (ZOSYN) IVPB 3.375 g     3.375 g 12.5 mL/hr over 240 Minutes Intravenous Every 8 hours 06/06/18 0850     06/06/18 0600  cefTRIAXone (ROCEPHIN) 2 g in sodium chloride 0.9 % 100 mL IVPB     2 g 200 mL/hr over 30 Minutes Intravenous  Once 06/06/18 0550 06/06/18 0654       Assessment/Plan HTN HLD DM H/o RYGB at [facility] 11/2016 Dr. Clent Ridges, complicated postop course with leak/abscess  Acute cholecystitis Elevated LFTs - LFTs have essentially normalized but patient continues to have intermittent RUQ pain requiring pain medication. Will advance to soft diet and transition to oral antibiotics. Plan to recheck this afternoon. If he is feeling better will plan to discharge on oral abx and a low fat diet. If he continues to have RUQ pain will need to consider cholecystectomy this admission.   ID -zosyn 1/19>>1/22, augmentin 1/22>> FEN -IVF, soft diet VTE -SCDs, sq heparin Foley -none Follow up -  [facility]   LOS: 3 days    Franne Forts , [facility] 06/09/2018, 8:30 AM Pager: [phone] Mon-Thurs 7:00 am-4:30 pm Fri 7:00 am -11:30 AM Sat-Sun 7:00 am-11:30 am

## 2018-06-09 NOTE — Discharge Instructions (Signed)
[facility] ° °Please arrive at least 30 min before your appointment to complete your check in paperwork.  If you are unable to arrive 30 min prior to your appointment time we may have to cancel or reschedule you. °LAPAROSCOPIC SURGERY: POST OP INSTRUCTIONS °Always review your discharge instruction sheet given to you by the facility where your surgery was performed. °IF YOU HAVE DISABILITY OR FAMILY LEAVE FORMS, YOU MUST BRING THEM TO THE OFFICE FOR PROCESSING.   °DO NOT GIVE THEM TO YOUR DOCTOR. ° °PAIN CONTROL ° °1. First take acetaminophen (Tylenol) AND/or ibuprofen (Advil) to control your pain after surgery.  Follow directions on package.  Taking acetaminophen (Tylenol) and/or ibuprofen (Advil) regularly after surgery will help to control your pain and lower the amount of prescription pain medication you may need.  You should not take more than 4,000 mg (4 grams) of acetaminophen (Tylenol) in 24 hours.  You should not take ibuprofen (Advil), aleve, motrin, naprosyn or other NSAIDS if you have a history of stomach ulcers or chronic kidney disease.  °2. A prescription for pain medication may be given to you upon discharge.  Take your pain medication as prescribed, if you still have uncontrolled pain after taking acetaminophen (Tylenol) or ibuprofen (Advil). °3. Use ice packs to help control pain. °4. If you need a refill on your pain medication, please contact your pharmacy.  They will contact our office to request authorization. Prescriptions will not be filled after 5pm or on week-ends. ° °HOME MEDICATIONS °5. Take your usually prescribed medications unless otherwise directed. ° °DIET °6. You should follow a light diet the first few days after arrival home.  Be sure to include lots of fluids daily. Avoid fatty, fried foods.  ° °CONSTIPATION °7. It is common to experience some constipation after surgery and if you are taking pain medication.  Increasing fluid intake and taking a stool  softener (such as Colace) will usually help or prevent this problem from occurring.  A mild laxative (Milk of Magnesia or Miralax) should be taken according to package instructions if there are no bowel movements after 48 hours. ° °WOUND/INCISION CARE °8. Most patients will experience some swelling and bruising in the area of the incisions.  Ice packs will help.  Swelling and bruising can take several days to resolve.  °9. Unless discharge instructions indicate otherwise, follow guidelines below  °a. STERI-STRIPS - you may remove your outer bandages 48 hours after surgery, and you may shower at that time.  You have steri-strips (small skin tapes) in place directly over the incision.  These strips should be left on the skin for 7-10 days.   °b. DERMABOND/SKIN GLUE - you may shower in 24 hours.  The glue will flake off over the next 2-3 weeks. °10. Any sutures or staples will be removed at the office during your follow-up visit. ° °ACTIVITIES °11. You may resume regular (light) daily activities beginning the next day--such as daily self-care, walking, climbing stairs--gradually increasing activities as tolerated.  You may have sexual intercourse when it is comfortable.  Refrain from any heavy lifting or straining until approved by your doctor. °a. You may drive when you are no longer taking prescription pain medication, you can comfortably wear a seatbelt, and you can safely maneuver your car and apply brakes. ° °FOLLOW-UP °12. You should see your doctor in the office for a follow-up appointment approximately 2-3 weeks after your surgery.  You should have been given your post-op/follow-up appointment when   your surgery was scheduled.  If you did not receive a post-op/follow-up appointment, make sure that you call for this appointment within a day or two after you arrive home to insure a convenient appointment time. ° °OTHER INSTRUCTIONS ° °WHEN TO CALL YOUR DOCTOR: °1. Fever over 101.0 °2. Inability to  urinate °3. Continued bleeding from incision. °4. Increased pain, redness, or drainage from the incision. °5. Increasing abdominal pain ° °The clinic staff is available to answer your questions during regular business hours.  Please don’t hesitate to call and ask to speak to one of the nurses for clinical concerns.  If you have a medical emergency, go to the nearest emergency room or call 911.  A surgeon from [facility] is always on call at the hospital. °[address], [address], [place], [place]  [address] ? [address] [address], [place], [place]   [address] °[phone] ? [phone] ? FAX [phone] ° ° ° °

## 2018-06-09 NOTE — Anesthesia Procedure Notes (Signed)
Procedure Name: Intubation Date/Time: 06/09/2018 12:49 PM Performed by: [name], CRNA Pre-anesthesia Checklist: Patient identified, Emergency Drugs available, Suction available and Patient being monitored Patient Re-evaluated:Patient Re-evaluated prior to induction Oxygen Delivery Method: Circle System Utilized Preoxygenation: Pre-oxygenation with 100% oxygen Induction Type: IV induction Ventilation: Mask ventilation without difficulty Laryngoscope Size: Mac and 4 Grade View: Grade I Tube type: Oral Tube size: 7.5 mm Number of attempts: 1 Airway Equipment and Method: Stylet Placement Confirmation: ETT inserted through vocal cords under direct vision,  positive ETCO2 and breath sounds checked- equal and bilateral Secured at: 22 cm Tube secured with: Tape Dental Injury: Teeth and Oropharynx as per pre-operative assessment

## 2018-06-10 ENCOUNTER — Encounter (HOSPITAL_COMMUNITY): Payer: Self-pay | Admitting: Surgery

## 2018-06-10 MED ORDER — OXYCODONE HCL 5 MG PO TABS: 5.0000 mg | ORAL_TABLET | Freq: Four times a day (QID) | ORAL | 0 refills | Status: DC | PRN

## 2018-06-10 MED ORDER — DOCUSATE SODIUM 100 MG PO CAPS: 100.0000 mg | ORAL_CAPSULE | Freq: Two times a day (BID) | ORAL | 0 refills | Status: DC

## 2018-06-10 MED ORDER — ACETAMINOPHEN 500 MG PO TABS: 1000.0000 mg | ORAL_TABLET | Freq: Four times a day (QID) | ORAL | 0 refills | Status: DC | PRN

## 2018-06-10 NOTE — Discharge Summary (Signed)
[facility] Discharge Summary   Patient ID: Albert White MRN: [id number] DOB/AGE: 09-28-1975 43 y.o.  Admit date: 06/06/2018 Discharge date: 06/10/2018  Admitting Diagnosis: Acute cholecystitis  Discharge Diagnosis Patient Active Problem List   Diagnosis Date Noted  . Choledocholithiasis 06/06/2018  . Polysubstance (excluding opioids) dependence, daily use (HCC) 06/24/2017  . MDD (major depressive disorder), recurrent severe, without psychosis (HCC) 06/22/2017  . Major depressive disorder, single episode, severe (HCC) 06/21/2017  . Polysubstance abuse (HCC) 06/21/2017  . Pain management   . Hypomagnesemia   . Acute pulmonary edema (HCC)   . Peritonitis (HCC)   . Gastrointestinal anastomotic leak   . Pleural effusion on left   . Postprocedural intraabdominal abscess 01/15/2017  . Increased band cell count 01/06/2017  . Generalized edema 01/06/2017  . Former smoker 08/08/2016  . Morbid obesity with BMI of 50.0-59.9, adult (HCC) 06/25/2016  . OSA (obstructive sleep apnea) 06/25/2016  . Vitamin D deficiency 06/25/2016  . Type 2 diabetes mellitus without complication, without long-term current use of insulin (HCC) 02/29/2016  . Mixed hyperlipidemia 02/29/2016  . Cellulitis and abscess of neck 02/29/2016  . Cellulitis 12/14/2013  . Morbid obesity (HCC) 12/14/2013  . Neck abscess 12/14/2013  . Hidradenitis 08/09/2012    Consultants None  Imaging: Dg Cholangiogram Operative  Result Date: 06/09/2018 CLINICAL DATA:  Laparoscopic cholecystectomy EXAM: INTRAOPERATIVE CHOLANGIOGRAM TECHNIQUE: Cholangiographic images from the C-arm fluoroscopic device were submitted for interpretation post-operatively. Please see the procedural report for the amount of contrast and the fluoroscopy time utilized. COMPARISON:  06/06/2018 FINDINGS: Intraoperative cholangiogram performed during the laparoscopic procedure. The residual cystic duct, biliary confluence, common hepatic duct,  and common bile duct are patent. Contrast drains into the duodenum. No dilatation, obstruction, stricture, or large filling defect. IMPRESSION: Patent biliary system. Electronically Signed   By: Judie Petit.  Shick M.D.   On: 06/09/2018 14:58    Procedures Dr. Cliffton Asters (06/09/18) - Laparoscopic Cholecystectomy with [facility]  Hospital Course:  Albert White is a 43yo male prior h/o RYGB with a complicated postop course including leak/abscess, who presented to [facility] 1/19 with acute onset RUQ pain.  Workup showed acute cholecystitis with elevated LFTs.  Patient was admitted, started on IV antibiotics. LFTs were monitored and trended down. He underwent procedure listed above on 1/22.  Tolerated procedure well and was transferred to the floor.  Diet was advanced as tolerated.  On POD1 the patient was voiding well, tolerating diet, ambulating well, pain well controlled, vital signs stable, incisions c/d/i and felt stable for discharge home.  Patient will follow up as below and knows to call with questions or concerns.    I have personally reviewed the patients medication history on the [place] controlled substance database.    Physical Exam: General:  Alert, NAD, pleasant, comfortable Pulm: effort normal Abd:  Soft, ND, appropriately tender, multiple lap incisions C/D/I  Allergies as of 06/10/2018      Reactions   Ibuprofen Other (See Comments)   Has bar      Medication List    STOP taking these medications   gabapentin 300 MG capsule Commonly known as:  NEURONTIN   hydrOXYzine 25 MG tablet Commonly known as:  ATARAX/VISTARIL   prazosin 1 MG capsule Commonly known as:  MINIPRESS     TAKE these medications   acetaminophen 500 MG tablet Commonly known as:  TYLENOL Take 2 tablets (1,000 mg total) by mouth every 6 (six) hours as needed. What changed:    medication strength  how  much to take  when to take this  reasons to take this  additional instructions   buPROPion 100 MG tablet Commonly known  as:  WELLBUTRIN Take 100 mg by mouth daily.   docusate sodium 100 MG capsule Commonly known as:  COLACE Take 1 capsule (100 mg total) by mouth 2 (two) times daily.   oxyCODONE 5 MG immediate release tablet Commonly known as:  Oxy IR/ROXICODONE Take 1 tablet (5 mg total) by mouth every 6 (six) hours as needed for severe pain.   sertraline 50 MG tablet Commonly known as:  ZOLOFT Take 1 tablet (50 mg total) by mouth daily. For depression What changed:  how much to take   traZODone 100 MG tablet Commonly known as:  DESYREL Take 1 tablet (100 mg total) by mouth at bedtime as needed for sleep. What changed:    how much to take  when to take this        Follow-up Information    Kinsinger, De Blanch, MD. Call.   Specialty:  General Surgery Why:  We are working on your appointment, please call to confirm. Please arrive 30 minutes prior to your appointment to check in and fill out paperwork. Bring photo ID and insurance information. Contact information: [address] [place] [address] [phone]           Signed: Franne Forts, [facility] 06/10/2018, 8:00 AM Pager: [phone] Mon-Thurs 7:00 am-4:30 pm Fri 7:00 am -11:30 AM Sat-Sun 7:00 am-11:30 am

## 2018-09-28 DIAGNOSIS — G5601 Carpal tunnel syndrome, right upper limb: Secondary | ICD-10-CM | POA: Diagnosis not present

## 2018-09-28 DIAGNOSIS — B351 Tinea unguium: Secondary | ICD-10-CM | POA: Diagnosis not present

## 2018-09-30 DIAGNOSIS — M79601 Pain in right arm: Secondary | ICD-10-CM | POA: Diagnosis not present

## 2018-10-01 DIAGNOSIS — Z125 Encounter for screening for malignant neoplasm of prostate: Secondary | ICD-10-CM | POA: Diagnosis not present

## 2018-10-01 DIAGNOSIS — Z0000 Encounter for general adult medical examination without abnormal findings: Secondary | ICD-10-CM | POA: Diagnosis not present

## 2018-10-07 DIAGNOSIS — Z0000 Encounter for general adult medical examination without abnormal findings: Secondary | ICD-10-CM | POA: Diagnosis not present

## 2018-10-20 ENCOUNTER — Emergency Department (HOSPITAL_COMMUNITY): Payer: 59

## 2018-10-20 ENCOUNTER — Emergency Department (HOSPITAL_COMMUNITY)
Admission: EM | Admit: 2018-10-20 | Discharge: 2018-10-20 | Disposition: A | Discharge status: Home / Self Care | Payer: 59 | Attending: Emergency Medicine | Admitting: Emergency Medicine

## 2018-10-20 ENCOUNTER — Encounter (HOSPITAL_COMMUNITY): Payer: Self-pay | Admitting: *Deleted

## 2018-10-20 DIAGNOSIS — M545 Low back pain, unspecified: Secondary | ICD-10-CM

## 2018-10-20 DIAGNOSIS — Z79899 Other long term (current) drug therapy: Secondary | ICD-10-CM | POA: Insufficient documentation

## 2018-10-20 DIAGNOSIS — Y929 Unspecified place or not applicable: Secondary | ICD-10-CM | POA: Diagnosis not present

## 2018-10-20 DIAGNOSIS — Y998 Other external cause status: Secondary | ICD-10-CM | POA: Insufficient documentation

## 2018-10-20 DIAGNOSIS — E119 Type 2 diabetes mellitus without complications: Secondary | ICD-10-CM | POA: Insufficient documentation

## 2018-10-20 DIAGNOSIS — Z87891 Personal history of nicotine dependence: Secondary | ICD-10-CM | POA: Insufficient documentation

## 2018-10-20 DIAGNOSIS — W19XXXA Unspecified fall, initial encounter: Secondary | ICD-10-CM

## 2018-10-20 DIAGNOSIS — Y939 Activity, unspecified: Secondary | ICD-10-CM | POA: Insufficient documentation

## 2018-10-20 DIAGNOSIS — S22080A Wedge compression fracture of T11-T12 vertebra, initial encounter for closed fracture: Secondary | ICD-10-CM | POA: Diagnosis not present

## 2018-10-20 DIAGNOSIS — F1410 Cocaine abuse, uncomplicated: Secondary | ICD-10-CM | POA: Diagnosis not present

## 2018-10-20 DIAGNOSIS — S299XXA Unspecified injury of thorax, initial encounter: Secondary | ICD-10-CM | POA: Diagnosis present

## 2018-10-20 DIAGNOSIS — W11XXXA Fall on and from ladder, initial encounter: Secondary | ICD-10-CM | POA: Insufficient documentation

## 2018-10-20 MED ORDER — CYCLOBENZAPRINE HCL 10 MG PO TABS
5.0000 mg | ORAL_TABLET | Freq: Once | ORAL | Status: AC
  Administered 2018-10-20: 5 mg via ORAL
  Filled 2018-10-20: qty 1

## 2018-10-20 MED ORDER — MORPHINE SULFATE (PF) 4 MG/ML IV SOLN
4.0000 mg | Freq: Once | INTRAVENOUS | Status: AC
  Administered 2018-10-20: 4 mg via INTRAMUSCULAR
  Filled 2018-10-20: qty 1

## 2018-10-20 MED ORDER — OXYCODONE-ACETAMINOPHEN 5-325 MG PO TABS: 1.0000 | ORAL_TABLET | Freq: Four times a day (QID) | ORAL | 0 refills | Status: AC | PRN

## 2018-10-20 MED ORDER — HYDROMORPHONE HCL 1 MG/ML IJ SOLN
2.0000 mg | Freq: Once | INTRAMUSCULAR | Status: AC
  Administered 2018-10-20: 2 mg via INTRAMUSCULAR
  Filled 2018-10-20: qty 2

## 2018-10-20 MED ORDER — HYDROCODONE-ACETAMINOPHEN 5-325 MG PO TABS
2.0000 | ORAL_TABLET | Freq: Once | ORAL | Status: AC
  Administered 2018-10-20: 2 via ORAL
  Filled 2018-10-20: qty 2

## 2018-10-20 NOTE — Discharge Instructions (Signed)
Thank you for allowing me to care for you today. Please return to the emergency department if you have new or worsening symptoms. Take your medications as instructed.  ° °

## 2018-10-20 NOTE — ED Notes (Signed)
Patient transported to X-ray 

## 2018-10-20 NOTE — ED Provider Notes (Signed)
[facility] EMERGENCY DEPARTMENT Provider Note   CSN: [id number] Arrival date & time: 10/20/18  0946    History   Chief Complaint Chief Complaint  Patient presents with  . Fall    HPI Albert White is a 43 y.o. male.     Patient is a 43 year old guy with past medical history of diabetes, polysubstance abuse who presents to the emergency department for fall.  Patient reports that 2 days ago he was standing about 5 feet off the ground on a ladder when he fell backwards.  Reports that he fell directly onto his tailbone and then the ladder fell on his right side.  He hit his head but did not pass out.  Reports that he is having a lot of pain in his right ribs and lower back.  He thinks that he broke some ribs.  Denies any nausea, vomiting, diarrhea.     Past Medical History:  Diagnosis Date  . Abscess   . Diabetes mellitus without complication (HCC)   . Hidradenitis suppurativa   . Hyperlipemia   . Morbid obesity (HCC)   . Vertigo     Patient Active Problem List   Diagnosis Date Noted  . Choledocholithiasis 06/06/2018  . Polysubstance (excluding opioids) dependence, daily use (HCC) 06/24/2017  . MDD (major depressive disorder), recurrent severe, without psychosis (HCC) 06/22/2017  . Major depressive disorder, single episode, severe (HCC) 06/21/2017  . Polysubstance abuse (HCC) 06/21/2017  . Pain management   . Hypomagnesemia   . Acute pulmonary edema (HCC)   . Peritonitis (HCC)   . Gastrointestinal anastomotic leak   . Pleural effusion on left   . Postprocedural intraabdominal abscess 01/15/2017  . Increased band cell count 01/06/2017  . Generalized edema 01/06/2017  . Former smoker 08/08/2016  . Morbid obesity with BMI of 50.0-59.9, adult (HCC) 06/25/2016  . OSA (obstructive sleep apnea) 06/25/2016  . Vitamin D deficiency 06/25/2016  . Type 2 diabetes mellitus without complication, without long-term current use of insulin (HCC) 02/29/2016  .  Mixed hyperlipidemia 02/29/2016  . Cellulitis and abscess of neck 02/29/2016  . Cellulitis 12/14/2013  . Morbid obesity (HCC) 12/14/2013  . Neck abscess 12/14/2013  . Hidradenitis 08/09/2012    Past Surgical History:  Procedure Laterality Date  . CHOLECYSTECTOMY N/A 06/09/2018   Procedure: LAPAROSCOPIC CHOLECYSTECTOMY WITH   INTRAOPERATIVE CHOLANGIOGRAM,;  Surgeon: Andria Meuse, MD;  Location: [facility] OR;  Service: General;  Laterality: N/A;  . GASTRIC BYPASS  11/2016  . INCISION AND DRAINAGE ABSCESS Right 12/15/2013   Procedure: INCISION AND DRAINAGE Right Posterior neck ABSCESS;  Surgeon: Cherylynn Ridges, MD;  Location[facility] OR;  Service: General;  Laterality: Right;  . INCISION AND DRAINAGE DEEP NECK ABSCESS     Hidradenitis - 3-4 times at [facility] and [place]  . IR CATHETER TUBE CHANGE  02/18/2017  . IR RADIOLOGIST EVAL & MGMT  02/11/2017  . IR RADIOLOGIST EVAL & MGMT  03/03/2017  . IR THORACENTESIS ASP PLEURAL SPACE W/IMG GUIDE  01/23/2017  . LAPAROSCOPIC LYSIS OF ADHESIONS N/A 06/09/2018   Procedure: LAPAROSCOPIC LYSIS OF ADHESIONS;  Surgeon: Andria Meuse, MD;  Location: [facility] OR;  Service: General;  Laterality: N/A;        Home Medications    Prior to Admission medications   Medication Sig Start Date End Date Taking? Authorizing Provider  acetaminophen (TYLENOL) 500 MG tablet Take 2 tablets (1,000 mg total) by mouth every 6 (six) hours as needed. 06/10/18   Meuth,  Brooke A, PA-C  buPROPion (WELLBUTRIN) 100 MG tablet Take 100 mg by mouth daily.    [provider]  docusate sodium (COLACE) 100 MG capsule Take 1 capsule (100 mg total) by mouth 2 (two) times daily. 06/10/18   Meuth, Brooke A, PA-C  oxyCODONE (OXY IR/ROXICODONE) 5 MG immediate release tablet Take 1 tablet (5 mg total) by mouth every 6 (six) hours as needed for severe pain. 06/10/18   Meuth, Lina Sar, PA-C  oxyCODONE-acetaminophen (PERCOCET/ROXICET) 5-325 MG tablet Take 1-2 tablets by mouth every 6 (six) hours as  needed for up to 3 days for severe pain. 10/20/18 10/23/18  Ronnie Doss A, PA-C  sertraline (ZOLOFT) 50 MG tablet Take 1 tablet (50 mg total) by mouth daily. For depression Patient taking differently: Take 100 mg by mouth daily. For depression 06/27/17   Armandina Stammer I, NP  traZODone (DESYREL) 100 MG tablet Take 1 tablet (100 mg total) by mouth at bedtime as needed for sleep. Patient taking differently: Take 200 mg by mouth at bedtime.  06/26/17   Sanjuana Kava, NP    Family History Family History  Problem Relation Age of Onset  . Arthritis Mother   . Hypertension Mother   . Cancer Other   . Heart disease Other     Social History Social History   Tobacco Use  . Smoking status: Former Smoker    Packs/day: 1.00    Last attempt to quit: 11/27/2013    Years since quitting: 4.8  . Smokeless tobacco: Never Used  Substance Use Topics  . Alcohol use: No  . Drug use: Yes    Types: Cocaine     Allergies   Ibuprofen   Review of Systems Review of Systems  Constitutional: Negative for appetite change and fever.  Respiratory: Negative for shortness of breath.   Cardiovascular: Negative for chest pain.  Gastrointestinal: Negative for abdominal pain, nausea and vomiting.  Musculoskeletal: Positive for arthralgias and back pain. Negative for gait problem, joint swelling, myalgias, neck pain and neck stiffness.  Skin: Negative for rash and wound.  Neurological: Negative for dizziness, syncope, light-headedness and headaches.     Physical Exam Updated Vital Signs BP (!) 131/91 (BP Location: Right Arm)   Pulse (!) 59   Temp 98 F (36.7 C) (Oral)   Resp 18   SpO2 99%   Physical Exam Vitals signs and nursing note reviewed.  Constitutional:      General: He is not in acute distress.    Appearance: Normal appearance. He is obese. He is not ill-appearing, toxic-appearing or diaphoretic.  HENT:     Head: Normocephalic and atraumatic.     Nose: Nose normal.     Mouth/Throat:      Mouth: Mucous membranes are moist.  Eyes:     Conjunctiva/sclera: Conjunctivae normal.     Pupils: Pupils are equal, round, and reactive to light.  Pulmonary:     Effort: Pulmonary effort is normal.  Chest:       Comments: Tenderness to palpation in the right mid ribs. Musculoskeletal:     Comments: Tender to palpation midline from lower thoracic to the tailbone.  Skin:    General: Skin is dry.     Comments: No bruising, abrasions or signs of injury or trauma  Neurological:     Mental Status: He is alert.     Comments: Normal bilateral lower extremity strength, reflexes, sensation  Psychiatric:        Mood and Affect: Mood normal.  ED Treatments / Results  Labs (all labs ordered are listed, but only abnormal results are displayed) Labs Reviewed - No data to display  EKG None  Radiology Dg Chest 2 View  Result Date: 10/20/2018 CLINICAL DATA:  Fall 2 days ago.  Right lateral chest pain. EXAM: CHEST - 2 VIEW COMPARISON:  Radiographs 02/01/2017 and 01/30/2017. CT chest 06/20/2017. FINDINGS: The heart size and mediastinal contours are stable. The lungs are clear. There is no pleural effusion or pneumothorax. No rib fractures are identified. There is increased kyphosis at the thoracolumbar junction with increased anterior wedging of the T12 vertebral body. See separate lumbar spine report. IMPRESSION: No evidence of active cardiopulmonary process or acute chest injury. Increased anterior wedging of the T12 vertebral body; see separate lumbar spine report. Electronically Signed   By: Carey Bullocks M.D.   On: 10/20/2018 11:04   Dg Lumbar Spine 2-3 Views  Result Date: 10/20/2018 CLINICAL DATA:  Fall 2 days ago with right lateral chest and back pain. EXAM: LUMBAR SPINE - 2-3 VIEW COMPARISON:  Abdominopelvic CT 06/06/2018. FINDINGS: Transitional lumbosacral anatomy. In correlation with previous studies, there is a transitional, nearly fully lumbarized S1 segment. There is increased  anterior wedging of the T12 vertebral body compared with the CT of January. This demonstrates no definite acute components radiographically. The lumbar vertebral bodies are intact. The alignment is stable and near anatomic. There are mild degenerative changes at the thoracolumbar junction and in the lower lumbar spine. Left renal calculus and postsurgical changes in the stomach are noted. IMPRESSION: Increased anterior wedging of the T12 vertebral body compared with abdominal CT 06/06/2018 without definite acute radiographic features. This could be further evaluated with CT if clinically warranted. No acute lumbar spine findings. Electronically Signed   By: Carey Bullocks M.D.   On: 10/20/2018 11:10   Dg Sacrum/coccyx  Result Date: 10/20/2018 CLINICAL DATA:  Fall 2 days ago. Right lateral chest and sacral pain. EXAM: SACRUM AND COCCYX - 2+ VIEW COMPARISON:  Pelvic CT 06/06/2018. FINDINGS: No evidence of acute fracture. The sacrum and sacroiliac joints appear intact. The symphysis pubis is intact. IMPRESSION: No acute osseous findings. Electronically Signed   By: Carey Bullocks M.D.   On: 10/20/2018 11:06    Procedures Procedures (including critical care time)  Medications Ordered in ED Medications  HYDROmorphone (DILAUDID) injection 2 mg (has no administration in time range)  cyclobenzaprine (FLEXERIL) tablet 5 mg (5 mg Oral Given 10/20/18 1053)  HYDROcodone-acetaminophen (NORCO/VICODIN) 5-325 MG per tablet 2 tablet (2 tablets Oral Given 10/20/18 1053)  morphine 4 MG/ML injection 4 mg (4 mg Intramuscular Given 10/20/18 1150)     Initial Impression / Assessment and Plan / ED Course  I have reviewed the triage vital signs and the nursing notes.  Pertinent labs & imaging results that were available during my care of the patient were reviewed by me and considered in my medical decision making (see chart for details).  Clinical Course as of Oct 20 1439  Wed Oct 20, 2018  1143 X-rays of the ribs and  lumbar spine and tailbone appear normal.  T12 on x-ray appears to be mildly increased in compression which could indicated T12 compression fracture.  I did palpate the patient's spine at this level and he states that there is a lot of pain.  Will obtain CT scan.  Patient reports he is still in a lot of pain after Flexeril and hydrocodone.  We will give IM morphine.   [KM]  1435  Patient does appear to have an acute T12 compression fracture with approximately 20% disc height loss on CT scan.  I will treat patient with pain medication and he is to follow-up with spinal specialist as soon as possible for discussion of conservative treatment versus kyphoplasty.   [KM]    Clinical Course User Index [KM] Arlyn DunningMcLean, Quintin Hjort A, PA-C       Based on review of vitals, medical screening exam, lab work and/or imaging, there does not appear to be an acute, emergent etiology for the patient's symptoms. Counseled pt on good return precautions and encouraged both PCP and ED follow-up as needed.  Prior to discharge, I also discussed incidental imaging findings with patient in detail and advised appropriate, recommended follow-up in detail.  Clinical Impression: 1. Compression fracture of T12 vertebra, initial encounter (HCC)   2. Fall, initial encounter   3. Acute bilateral low back pain without sciatica     Disposition: Discharge  Prior to providing a prescription for a controlled substance, I independently reviewed the patient's recent prescription history on the [place] Controlled Substance Reporting System. The patient had no recent or regular prescriptions and was deemed appropriate for a brief, less than 3 day prescription of narcotic for acute analgesia.  This note was prepared with assistance of Conservation officer, historic buildingsDragon voice recognition software. Occasional wrong-word or sound-a-like substitutions may have occurred due to the inherent limitations of voice recognition software.    Final Clinical Impressions(s) / ED  Diagnoses   Final diagnoses:  Fall, initial encounter  Acute bilateral low back pain without sciatica  Compression fracture of T12 vertebra, initial encounter [facility]HCC)    ED Discharge Orders         Ordered    oxyCODONE-acetaminophen (PERCOCET/ROXICET) 5-325 MG tablet  Every 6 hours PRN     10/20/18 1439           Jeral PinchMcLean, Markeda Narvaez A, PA-C 10/20/18 1441    Charlynne PanderYao, David Hsienta, MD 10/20/18 1622

## 2018-10-20 NOTE — ED Triage Notes (Signed)
To ED for eval of lower back pain and right side pain since falling 5 ft from a ladder 2 days ago. Pt ambulatory but with pain. No sob.

## 2018-10-20 NOTE — ED Notes (Signed)
Pt returns from xray

## 2020-01-17 ENCOUNTER — Ambulatory Visit (HOSPITAL_BASED_OUTPATIENT_CLINIC_OR_DEPARTMENT_OTHER)
Admission: RE | Admit: 2020-01-17 | Discharge: 2020-01-17 | Disposition: A | Discharge status: Home / Self Care | Payer: 59 | Source: Ambulatory Visit | Attending: Registered Nurse | Admitting: Registered Nurse

## 2020-01-17 ENCOUNTER — Other Ambulatory Visit (HOSPITAL_BASED_OUTPATIENT_CLINIC_OR_DEPARTMENT_OTHER): Payer: Self-pay | Admitting: Registered Nurse

## 2020-01-17 ENCOUNTER — Other Ambulatory Visit: Payer: Self-pay

## 2020-01-17 DIAGNOSIS — R1011 Right upper quadrant pain: Secondary | ICD-10-CM

## 2020-01-17 MED ORDER — IOHEXOL 300 MG/ML  SOLN
100.0000 mL | Freq: Once | INTRAMUSCULAR | Status: AC | PRN
  Administered 2020-01-17: 100 mL via INTRAVENOUS

## 2020-01-27 ENCOUNTER — Encounter: Payer: Self-pay | Admitting: Nurse Practitioner

## 2020-02-10 ENCOUNTER — Other Ambulatory Visit: Payer: Self-pay | Admitting: Urology

## 2020-02-13 ENCOUNTER — Other Ambulatory Visit (HOSPITAL_COMMUNITY)
Admission: RE | Admit: 2020-02-13 | Discharge: 2020-02-13 | Disposition: A | Discharge status: Home / Self Care | Payer: 59 | Source: Ambulatory Visit | Attending: Urology | Admitting: Urology

## 2020-02-13 DIAGNOSIS — Z01812 Encounter for preprocedural laboratory examination: Secondary | ICD-10-CM | POA: Diagnosis present

## 2020-02-13 DIAGNOSIS — Z20822 Contact with and (suspected) exposure to covid-19: Secondary | ICD-10-CM | POA: Insufficient documentation

## 2020-02-13 LAB — SARS CORONAVIRUS 2 (TAT 6-24 HRS): SARS Coronavirus 2: NEGATIVE

## 2020-02-13 NOTE — Progress Notes (Signed)
Attempted to contact patient regarding ESWL on 02/13/20. Cell phone and home phone have no capability to receive messages.

## 2020-02-14 NOTE — Progress Notes (Signed)
Attempted again to contact patient without success. Neither phone number will connect to an answering device. [facility] notified.

## 2020-02-15 NOTE — H&P (Signed)
CC: Left renal calculus  HPI:  44 year old male has been experiencing left-sided flank pain for about 3 or 4 weeks now. He underwent a CT scan of the abdomen and pelvis on 01/17/2020 that revealed a 1.1 cm left mid pole calculus. He continues to have left-sided flank pain. It is severe enough to need pain medication. He has occasional dysuria but not very often. This is his 1st stone. He has been taking hydrocodone as needed. No NSAIDs or aspirin.     ALLERGIES: None   MEDICATIONS: Trazodone Hcl 100 mg tablet     GU PSH: None   NON-GU PSH: Cholecystectomy (laparoscopic) - 2020 Gastric bypass - 2018     GU PMH: None   NON-GU PMH: Anxiety GERD    FAMILY HISTORY: Death In The Family Father - Father Death In The Family Mother - Mother   SOCIAL HISTORY: Marital Status: Married Preferred Language: English; Ethnicity: Not Hispanic Or Latino; Race: White Current Smoking Status: Patient does not smoke anymore. Has not smoked since 01/17/2017. Smoked for 26 years. Smoked 1 pack per day.   Tobacco Use Assessment Completed: Used Tobacco in last 30 days? Has never drank.  Drinks 2 caffeinated drinks per day. Patient's occupation Scientific laboratory technician.    REVIEW OF SYSTEMS:    GU Review Male:   Patient reports frequent urination, hard to postpone urination, burning/ pain with urination, get up at night to urinate, stream starts and stops, and have to strain to urinate . Patient denies leakage of urine, trouble starting your stream, erection problems, and penile pain.  Gastrointestinal (Upper):   Patient reports indigestion/ heartburn. Patient denies nausea and vomiting.  Gastrointestinal (Lower):   Patient denies diarrhea and constipation.  Constitutional:   Patient denies fever, night sweats, weight loss, and fatigue.  Skin:   Patient denies skin rash/ lesion and itching.  Eyes:   Patient denies blurred vision and double vision.  Ears/ Nose/ Throat:   Patient denies sore throat and sinus  problems.  Hematologic/Lymphatic:   Patient denies swollen glands and easy bruising.  Cardiovascular:   Patient denies leg swelling and chest pains.  Respiratory:   Patient denies cough and shortness of breath.  Endocrine:   Patient denies excessive thirst.  Musculoskeletal:   Patient reports back pain. Patient denies joint pain.  Neurological:   Patient denies headaches and dizziness.  Psychologic:   Patient denies depression and anxiety.   VITAL SIGNS:      02/08/2020 11:42 AM  Weight 259.9 lb / 117.89 kg  Height 70 in / 177.8 cm  BP 110/74 mmHg  Pulse 69 /min  Temperature 98.0 F / 36.6 C  BMI 37.3 kg/m   MULTI-SYSTEM PHYSICAL EXAMINATION:    Constitutional: Well-nourished. No physical deformities. Normally developed. Good grooming.  Respiratory: No labored breathing, no use of accessory muscles.   Cardiovascular: Normal temperature, normal extremity pulses, no swelling, no varicosities.  Skin: No paleness, no jaundice, no cyanosis. No lesion, no ulcer, no rash.  Neurologic / Psychiatric: Oriented to time, oriented to place, oriented to person. No depression, no anxiety, no agitation.  Gastrointestinal: No mass, no tenderness, no rigidity, obese abdomen.   Eyes: Normal conjunctivae. Normal eyelids.  Musculoskeletal: Normal gait and station of head and neck.     Complexity of Data:  Source Of History:  Patient  Records Review:   Previous Doctor Records, Previous Patient Records  Urine Test Review:   Urinalysis  X-Ray Review: C.T. Abdomen/Pelvis: Reviewed Films. Reviewed Report. Discussed With Patient.  PROCEDURES:         KUB - [id number]  A single view of the abdomen is obtained.      . Patient confirmed No Neulasta OnPro Device.           Urinalysis w/Scope Dipstick Dipstick Cont'd Micro  Color: Yellow Bilirubin: Neg mg/dL WBC/hpf: 0 - 5/hpf  Appearance: Clear Ketones: Neg mg/dL RBC/hpf: 10 - 20/hpf  Specific Gravity: 1.025 Blood: 3+ ery/uL Bacteria: Few (10-25/hpf)   pH: 6.0 Protein: Neg mg/dL Cystals: NS (Not Seen)  Glucose: Neg mg/dL Urobilinogen: 1.0 mg/dL Casts: Hyaline    Nitrites: Neg Trichomonas: Not Present    Leukocyte Esterase: Neg leu/uL Mucous: Present      Epithelial Cells: 0 - 5/hpf      Yeast: NS (Not Seen)      Sperm: Not Present    ASSESSMENT:      ICD-10 Details  1 GU:   Renal calculus - N20.0 Undiagnosed New Problem  2   Renal colic - N23 Undiagnosed New Problem   PLAN:            Medications New Meds: Hydrocodone-Acetaminophen 5 mg-325 mg tablet 1 tablet PO Q 6 H PRN pain  #10  0 Refill(s)            Orders Labs Urine Culture  X-Rays: KUB          Schedule         Document Letter(s):  Created for Patient: Clinical Summary         Notes:   We discussed the management of urinary stones. These options include observation, ureteroscopy, and shockwave lithotripsy. We discussed which options are relevant to these particular stones. We discussed the natural history of stones as well as the complications of untreated stones and the impact on quality of life without treatment as well as with each of the above listed treatments. We also discussed the efficacy of each treatment in its ability to clear the stone burden. With any of these management options I discussed the signs and symptoms of infection and the need for emergent treatment should these be experienced. For each option we discussed the ability of each procedure to clear the patient of their stone burden.   For observation I described the risks which include but are not limited to silent renal damage, life-threatening infection, need for emergent surgery, failure to pass stone, and pain.   For ureteroscopy I described the risks which include heart attack, stroke, pulmonary embolus, death, bleeding, infection, damage to contiguous structures, positioning injury, ureteral stricture, ureteral avulsion, ureteral injury, need for ureteral stent, inability to perform  ureteroscopy, need for an interval procedure, inability to clear stone burden, stent discomfort and pain.   For shockwave lithotripsy I described the risks which include arrhythmia, kidney contusion, kidney hemorrhage, need for transfusion, pain, inability to break up stone, inability to pass stone fragments, Steinstrasse, infection associated with obstructing stones, need for different surgical procedure, need for repeat shockwave lithotripsy   He would like to proceed with ESWL    After a thorough review of the management options for the patient's condition the patient  elected to proceed with surgical therapy as noted above. We have discussed the potential benefits and risks of the procedure, side effects of the proposed treatment, the likelihood of the patient achieving the goals of the procedure, and any potential problems that might occur during the procedure or recuperation. Informed consent has been obtained. 

## 2020-02-15 NOTE — Progress Notes (Signed)
Patient to arrive at 0600 on 02/16/2020. History and medications reviewed. Pre-procedure instructions given. NPO after MN. Driver secured.

## 2020-02-15 NOTE — H&P (View-Only) (Signed)
CC: Left renal calculus  HPI:  44 year old male has been experiencing left-sided flank pain for about 3 or 4 weeks now. He underwent a CT scan of the abdomen and pelvis on 01/17/2020 that revealed a 1.1 cm left mid pole calculus. He continues to have left-sided flank pain. It is severe enough to need pain medication. He has occasional dysuria but not very often. This is his 1st stone. He has been taking hydrocodone as needed. No NSAIDs or aspirin.     ALLERGIES: None   MEDICATIONS: Trazodone Hcl 100 mg tablet     GU PSH: None   NON-GU PSH: Cholecystectomy (laparoscopic) - 2020 Gastric bypass - 2018     GU PMH: None   NON-GU PMH: Anxiety GERD    FAMILY HISTORY: Death In The Family Father - Father Death In The Family Mother - Mother   SOCIAL HISTORY: Marital Status: Married Preferred Language: English; Ethnicity: Not Hispanic Or Latino; Race: White Current Smoking Status: Patient does not smoke anymore. Has not smoked since 01/17/2017. Smoked for 26 years. Smoked 1 pack per day.   Tobacco Use Assessment Completed: Used Tobacco in last 30 days? Has never drank.  Drinks 2 caffeinated drinks per day. Patient's occupation Scientific laboratory technician.    REVIEW OF SYSTEMS:    GU Review Male:   Patient reports frequent urination, hard to postpone urination, burning/ pain with urination, get up at night to urinate, stream starts and stops, and have to strain to urinate . Patient denies leakage of urine, trouble starting your stream, erection problems, and penile pain.  Gastrointestinal (Upper):   Patient reports indigestion/ heartburn. Patient denies nausea and vomiting.  Gastrointestinal (Lower):   Patient denies diarrhea and constipation.  Constitutional:   Patient denies fever, night sweats, weight loss, and fatigue.  Skin:   Patient denies skin rash/ lesion and itching.  Eyes:   Patient denies blurred vision and double vision.  Ears/ Nose/ Throat:   Patient denies sore throat and sinus  problems.  Hematologic/Lymphatic:   Patient denies swollen glands and easy bruising.  Cardiovascular:   Patient denies leg swelling and chest pains.  Respiratory:   Patient denies cough and shortness of breath.  Endocrine:   Patient denies excessive thirst.  Musculoskeletal:   Patient reports back pain. Patient denies joint pain.  Neurological:   Patient denies headaches and dizziness.  Psychologic:   Patient denies depression and anxiety.   VITAL SIGNS:      02/08/2020 11:42 AM  Weight 259.9 lb / 117.89 kg  Height 70 in / 177.8 cm  BP 110/74 mmHg  Pulse 69 /min  Temperature 98.0 F / 36.6 C  BMI 37.3 kg/m   MULTI-SYSTEM PHYSICAL EXAMINATION:    Constitutional: Well-nourished. No physical deformities. Normally developed. Good grooming.  Respiratory: No labored breathing, no use of accessory muscles.   Cardiovascular: Normal temperature, normal extremity pulses, no swelling, no varicosities.  Skin: No paleness, no jaundice, no cyanosis. No lesion, no ulcer, no rash.  Neurologic / Psychiatric: Oriented to time, oriented to place, oriented to person. No depression, no anxiety, no agitation.  Gastrointestinal: No mass, no tenderness, no rigidity, obese abdomen.   Eyes: Normal conjunctivae. Normal eyelids.  Musculoskeletal: Normal gait and station of head and neck.     Complexity of Data:  Source Of History:  Patient  Records Review:   Previous Doctor Records, Previous Patient Records  Urine Test Review:   Urinalysis  X-Ray Review: C.T. Abdomen/Pelvis: Reviewed Films. Reviewed Report. Discussed With Patient.  PROCEDURES:         KUB - [id number]  A single view of the abdomen is obtained.      . Patient confirmed No Neulasta OnPro Device.           Urinalysis w/Scope Dipstick Dipstick Cont'd Micro  Color: Yellow Bilirubin: Neg mg/dL WBC/hpf: 0 - 5/hpf  Appearance: Clear Ketones: Neg mg/dL RBC/hpf: 10 - 56/OZH  Specific Gravity: 1.025 Blood: 3+ ery/uL Bacteria: Few (10-25/hpf)   pH: 6.0 Protein: Neg mg/dL Cystals: NS (Not Seen)  Glucose: Neg mg/dL Urobilinogen: 1.0 mg/dL Casts: Hyaline    Nitrites: Neg Trichomonas: Not Present    Leukocyte Esterase: Neg leu/uL Mucous: Present      Epithelial Cells: 0 - 5/hpf      Yeast: NS (Not Seen)      Sperm: Not Present    ASSESSMENT:      ICD-10 Details  1 GU:   Renal calculus - N20.0 Undiagnosed New Problem  2   Renal colic - N23 Undiagnosed New Problem   PLAN:            Medications New Meds: Hydrocodone-Acetaminophen 5 mg-325 mg tablet 1 tablet PO Q 6 H PRN pain  #10  0 Refill(s)            Orders Labs Urine Culture  X-Rays: KUB          Schedule         Document Letter(s):  Created for Patient: Clinical Summary         Notes:   We discussed the management of urinary stones. These options include observation, ureteroscopy, and shockwave lithotripsy. We discussed which options are relevant to these particular stones. We discussed the natural history of stones as well as the complications of untreated stones and the impact on quality of life without treatment as well as with each of the above listed treatments. We also discussed the efficacy of each treatment in its ability to clear the stone burden. With any of these management options I discussed the signs and symptoms of infection and the need for emergent treatment should these be experienced. For each option we discussed the ability of each procedure to clear the patient of their stone burden.   For observation I described the risks which include but are not limited to silent renal damage, life-threatening infection, need for emergent surgery, failure to pass stone, and pain.   For ureteroscopy I described the risks which include heart attack, stroke, pulmonary embolus, death, bleeding, infection, damage to contiguous structures, positioning injury, ureteral stricture, ureteral avulsion, ureteral injury, need for ureteral stent, inability to perform  ureteroscopy, need for an interval procedure, inability to clear stone burden, stent discomfort and pain.   For shockwave lithotripsy I described the risks which include arrhythmia, kidney contusion, kidney hemorrhage, need for transfusion, pain, inability to break up stone, inability to pass stone fragments, Steinstrasse, infection associated with obstructing stones, need for different surgical procedure, need for repeat shockwave lithotripsy   He would like to proceed with ESWL    After a thorough review of the management options for the patient's condition the patient  elected to proceed with surgical therapy as noted above. We have discussed the potential benefits and risks of the procedure, side effects of the proposed treatment, the likelihood of the patient achieving the goals of the procedure, and any potential problems that might occur during the procedure or recuperation. Informed consent has been obtained.

## 2020-02-16 ENCOUNTER — Ambulatory Visit (HOSPITAL_BASED_OUTPATIENT_CLINIC_OR_DEPARTMENT_OTHER)
Admission: RE | Admit: 2020-02-16 | Discharge: 2020-02-16 | Disposition: A | Discharge status: Home / Self Care | Payer: 59 | Attending: Urology | Admitting: Urology

## 2020-02-16 ENCOUNTER — Other Ambulatory Visit: Payer: Self-pay

## 2020-02-16 ENCOUNTER — Encounter (HOSPITAL_BASED_OUTPATIENT_CLINIC_OR_DEPARTMENT_OTHER): Payer: Self-pay | Admitting: Urology

## 2020-02-16 ENCOUNTER — Encounter (HOSPITAL_BASED_OUTPATIENT_CLINIC_OR_DEPARTMENT_OTHER)
Admission: RE | Disposition: A | Discharge status: Home / Self Care | Payer: Self-pay | Source: Home / Self Care | Attending: Urology

## 2020-02-16 ENCOUNTER — Ambulatory Visit (HOSPITAL_COMMUNITY): Payer: 59

## 2020-02-16 DIAGNOSIS — N200 Calculus of kidney: Secondary | ICD-10-CM

## 2020-02-16 DIAGNOSIS — Z885 Allergy status to narcotic agent status: Secondary | ICD-10-CM | POA: Insufficient documentation

## 2020-02-16 DIAGNOSIS — Z87891 Personal history of nicotine dependence: Secondary | ICD-10-CM | POA: Diagnosis not present

## 2020-02-16 DIAGNOSIS — N202 Calculus of kidney with calculus of ureter: Secondary | ICD-10-CM | POA: Diagnosis not present

## 2020-02-16 DIAGNOSIS — N23 Unspecified renal colic: Secondary | ICD-10-CM | POA: Insufficient documentation

## 2020-02-16 DIAGNOSIS — Z9884 Bariatric surgery status: Secondary | ICD-10-CM | POA: Insufficient documentation

## 2020-02-16 HISTORY — PX: EXTRACORPOREAL SHOCK WAVE LITHOTRIPSY: SHX1557

## 2020-02-16 SURGERY — LITHOTRIPSY, ESWL
Anesthesia: LOCAL | Laterality: Left

## 2020-02-16 MED ORDER — KETOROLAC TROMETHAMINE 30 MG/ML IJ SOLN
INTRAMUSCULAR | Status: AC
  Filled 2020-02-16: qty 2

## 2020-02-16 MED ORDER — CIPROFLOXACIN HCL 500 MG PO TABS
500.0000 mg | ORAL_TABLET | ORAL | Status: AC
  Administered 2020-02-16: 500 mg via ORAL

## 2020-02-16 MED ORDER — KETOROLAC TROMETHAMINE 60 MG/2ML IM SOLN
60.0000 mg | Freq: Once | INTRAMUSCULAR | Status: AC
  Administered 2020-02-16: 60 mg via INTRAMUSCULAR
  Filled 2020-02-16: qty 2

## 2020-02-16 MED ORDER — DIAZEPAM 5 MG PO TABS
ORAL_TABLET | ORAL | Status: AC
  Filled 2020-02-16: qty 2

## 2020-02-16 MED ORDER — SODIUM CHLORIDE 0.9 % IV SOLN: INTRAVENOUS | Status: DC

## 2020-02-16 MED ORDER — OXYCODONE-ACETAMINOPHEN 5-325 MG PO TABS
2.0000 | ORAL_TABLET | Freq: Once | ORAL | Status: AC
  Administered 2020-02-16: 2 via ORAL

## 2020-02-16 MED ORDER — CIPROFLOXACIN HCL 500 MG PO TABS
ORAL_TABLET | ORAL | Status: AC
  Filled 2020-02-16: qty 1

## 2020-02-16 MED ORDER — OXYCODONE-ACETAMINOPHEN 5-325 MG PO TABS
ORAL_TABLET | ORAL | Status: AC
  Filled 2020-02-16: qty 2

## 2020-02-16 MED ORDER — DIPHENHYDRAMINE HCL 25 MG PO CAPS
ORAL_CAPSULE | ORAL | Status: AC
  Filled 2020-02-16: qty 1

## 2020-02-16 MED ORDER — DIAZEPAM 5 MG PO TABS
10.0000 mg | ORAL_TABLET | ORAL | Status: AC
  Administered 2020-02-16: 10 mg via ORAL

## 2020-02-16 MED ORDER — HYDROCODONE-ACETAMINOPHEN 5-325 MG PO TABS
1.0000 | ORAL_TABLET | Freq: Four times a day (QID) | ORAL | 0 refills | Status: DC | PRN
Start: 2020-02-16 — End: 2020-02-17

## 2020-02-16 MED ORDER — DIPHENHYDRAMINE HCL 25 MG PO CAPS
25.0000 mg | ORAL_CAPSULE | ORAL | Status: AC
  Administered 2020-02-16: 25 mg via ORAL

## 2020-02-16 NOTE — Interval H&P Note (Signed)
History and Physical Interval Note:  02/16/2020 7:26 AM  Albert White  has presented today for surgery, with the diagnosis of LEFT RENAL CALCULUS.  The various methods of treatment have been discussed with the patient and family. After consideration of risks, benefits and other options for treatment, the patient has consented to  Procedure(s): EXTRACORPOREAL SHOCK WAVE LITHOTRIPSY (ESWL) (Left) as a surgical intervention.  The patient's history has been reviewed, patient examined, no change in status, stable for surgery.  I have reviewed the patient's chart and labs.  Questions were answered to the patient's satisfaction.     Karlie Aung A Tyrhonda Georgiades

## 2020-02-16 NOTE — Discharge Instructions (Signed)
Lithotripsy, Care After This sheet gives you information about how to care for yourself after your procedure. Your health care provider may also give you more specific instructions. If you have problems or questions, contact your health care provider. What can I expect after the procedure? After the procedure, it is common to have:  Some blood in your urine. This should only last for a few days.  Soreness in your back, sides, or upper abdomen for a few days.  Blotches or bruises on your back where the pressure wave entered the skin.  Pain, discomfort, or nausea when pieces (fragments) of the kidney [place] move through the tube that carries urine from the kidney to the bladder (ureter). [place] fragments may pass soon after the procedure, but they may continue to pass for up to 4-8 weeks. ? If you have severe pain or nausea, contact your health care provider. This may be caused by a large [place] that was not broken up, and this may mean that you need more treatment.  Some pain or discomfort during urination.  Some pain or discomfort in the lower abdomen or (in men) at the base of the penis. Follow these instructions at home: Medicines  Take over-the-counter and prescription medicines only as told by your health care provider.  If you were prescribed an antibiotic medicine, take it as told by your health care provider. Do not stop taking the antibiotic even if you start to feel better.  Do not drive for 24 hours if you were given a medicine to help you relax (sedative).  Do not drive or use heavy machinery while taking prescription pain medicine. Eating and drinking      Drink enough water and fluids to keep your urine clear or pale yellow. This helps any remaining pieces of the [place] to pass. It can also help prevent new stones from forming.  Eat plenty of fresh fruits and vegetables.  Follow instructions from your health care provider about eating and drinking restrictions. You may be  instructed: ? To reduce how much salt (sodium) you eat or drink. Check ingredients and nutrition facts on packaged foods and beverages. ? To reduce how much meat you eat.  Eat the recommended amount of calcium for your age and gender. Ask your health care provider how much calcium you should have. General instructions  Get plenty of rest.  Most people can resume normal activities 1-2 days after the procedure. Ask your health care provider what activities are safe for you.  Your health care provider may direct you to lie in a certain position (postural drainage) and tap firmly (percuss) over your kidney area to help [place] fragments pass. Follow instructions as told by your health care provider.  If directed, strain all urine through the strainer that was provided by your health care provider. ? Keep all fragments for your health care provider to see. Any stones that are found may be sent to a medical lab for examination. The [place] may be as small as a grain of salt.  Keep all follow-up visits as told by your health care provider. This is important. Contact a health care provider if:  You have pain that is severe or does not get better with medicine.  You have nausea that is severe or does not go away.  You have blood in your urine longer than your health care provider told you to expect.  You have more blood in your urine.  You have pain during urination that does   not go away.  You urinate more frequently than usual and this does not go away.  You develop a rash or any other possible signs of an allergic reaction. Get help right away if:  You have severe pain in your back, sides, or upper abdomen.  You have severe pain while urinating.  Your urine is very dark red.  You have blood in your stool (feces).  You cannot pass any urine at all.  You feel a strong urge to urinate after emptying your bladder.  You have a fever or chills.  You develop shortness of breath,  difficulty breathing, or chest pain.  You have severe nausea that leads to persistent vomiting.  You faint. Summary  After this procedure, it is common to have some pain, discomfort, or nausea when pieces (fragments) of the kidney stone move through the tube that carries urine from the kidney to the bladder (ureter). If this pain or nausea is severe, however, you should contact your health care provider.  Most people can resume normal activities 1-2 days after the procedure. Ask your health care provider what activities are safe for you.  Drink enough water and fluids to keep your urine clear or pale yellow. This helps any remaining pieces of the stone to pass, and it can help prevent new stones from forming.  If directed, strain your urine and keep all fragments for your health care provider to see. Fragments or stones may be as small as a grain of salt.  Get help right away if you have severe pain in your back, sides, or upper abdomen or have severe pain while urinating. This information is not intended to replace advice given to you by your health care provider. Make sure you discuss any questions you have with your health care provider. Document Revised: 08/16/2018 Document Reviewed: 03/26/2016 Elsevier Patient Education  2020 [employer].     Post Anesthesia Home Care Instructions  Activity: Get plenty of rest for the remainder of the day. A responsible individual must stay with you for 24 hours following the procedure.  For the next 24 hours, DO NOT: -Drive a car -Advertising copywriter -Drink alcoholic beverages -Take any medication unless instructed by your physician -Make any legal decisions or sign important papers.  Meals: Start with liquid foods such as gelatin or soup. Progress to regular foods as tolerated. Avoid greasy, spicy, heavy foods. If nausea and/or vomiting occur, drink only clear liquids until the nausea and/or vomiting subsides. Call your physician if vomiting  continues.  Special Instructions/Symptoms: Your throat may feel dry or sore from the anesthesia or the breathing tube placed in your throat during surgery. If this causes discomfort, gargle with warm salt water. The discomfort should disappear within 24 hours.  If you had a scopolamine patch placed behind your ear for the management of post- operative nausea and/or vomiting:  1. The medication in the patch is effective for 72 hours, after which it should be removed.  Wrap patch in a tissue and discard in the trash. Wash hands thoroughly with soap and water. 2. You may remove the patch earlier than 72 hours if you experience unpleasant side effects which may include dry mouth, dizziness or visual disturbances. 3. Avoid touching the patch. Wash your hands with soap and water after contact with the patch.    Next dose of narcotic pain medication due at 2:00pm if needed.

## 2020-02-17 ENCOUNTER — Other Ambulatory Visit: Payer: Self-pay | Admitting: Urology

## 2020-02-17 ENCOUNTER — Encounter (HOSPITAL_BASED_OUTPATIENT_CLINIC_OR_DEPARTMENT_OTHER): Payer: Self-pay | Admitting: Urology

## 2020-02-17 ENCOUNTER — Ambulatory Visit (HOSPITAL_BASED_OUTPATIENT_CLINIC_OR_DEPARTMENT_OTHER): Payer: 59 | Admitting: Anesthesiology

## 2020-02-17 ENCOUNTER — Other Ambulatory Visit: Payer: Self-pay

## 2020-02-17 ENCOUNTER — Ambulatory Visit (HOSPITAL_BASED_OUTPATIENT_CLINIC_OR_DEPARTMENT_OTHER)
Admission: RE | Admit: 2020-02-17 | Discharge: 2020-02-17 | Disposition: A | Discharge status: Home / Self Care | Payer: 59 | Source: Ambulatory Visit | Attending: Urology | Admitting: Urology

## 2020-02-17 ENCOUNTER — Encounter (HOSPITAL_BASED_OUTPATIENT_CLINIC_OR_DEPARTMENT_OTHER)
Admission: RE | Disposition: A | Discharge status: Home / Self Care | Payer: Self-pay | Source: Ambulatory Visit | Attending: Urology

## 2020-02-17 DIAGNOSIS — Z6841 Body Mass Index (BMI) 40.0 and over, adult: Secondary | ICD-10-CM | POA: Diagnosis not present

## 2020-02-17 DIAGNOSIS — Z87891 Personal history of nicotine dependence: Secondary | ICD-10-CM | POA: Diagnosis not present

## 2020-02-17 DIAGNOSIS — N202 Calculus of kidney with calculus of ureter: Secondary | ICD-10-CM | POA: Diagnosis not present

## 2020-02-17 DIAGNOSIS — Z79899 Other long term (current) drug therapy: Secondary | ICD-10-CM | POA: Diagnosis not present

## 2020-02-17 DIAGNOSIS — Z79891 Long term (current) use of opiate analgesic: Secondary | ICD-10-CM | POA: Insufficient documentation

## 2020-02-17 DIAGNOSIS — F419 Anxiety disorder, unspecified: Secondary | ICD-10-CM | POA: Insufficient documentation

## 2020-02-17 HISTORY — PX: CYSTOSCOPY W/ URETERAL STENT PLACEMENT: SHX1429

## 2020-02-17 LAB — GLUCOSE, CAPILLARY: Glucose-Capillary: 107 mg/dL — ABNORMAL HIGH (ref 70–99)

## 2020-02-17 SURGERY — CYSTOSCOPY, WITH RETROGRADE PYELOGRAM AND URETERAL STENT INSERTION
Anesthesia: General | Site: Renal | Laterality: Left

## 2020-02-17 MED ORDER — DEXAMETHASONE SODIUM PHOSPHATE 10 MG/ML IJ SOLN
INTRAMUSCULAR | Status: AC
  Filled 2020-02-17: qty 1

## 2020-02-17 MED ORDER — CEPHALEXIN 500 MG PO CAPS: 500.0000 mg | ORAL_CAPSULE | Freq: Three times a day (TID) | ORAL | 0 refills | Status: AC

## 2020-02-17 MED ORDER — OXYCODONE HCL 5 MG PO TABS: 5.0000 mg | ORAL_TABLET | ORAL | 0 refills | Status: DC | PRN

## 2020-02-17 MED ORDER — FENTANYL CITRATE (PF) 100 MCG/2ML IJ SOLN
INTRAMUSCULAR | Status: AC
  Filled 2020-02-17: qty 2

## 2020-02-17 MED ORDER — FENTANYL CITRATE (PF) 100 MCG/2ML IJ SOLN
25.0000 ug | INTRAMUSCULAR | Status: DC | PRN
  Administered 2020-02-17: 50 ug via INTRAVENOUS
  Administered 2020-02-17: 25 ug via INTRAVENOUS

## 2020-02-17 MED ORDER — FENTANYL CITRATE (PF) 100 MCG/2ML IJ SOLN
100.0000 ug | Freq: Once | INTRAMUSCULAR | Status: AC
  Administered 2020-02-17: 50 ug via INTRAVENOUS

## 2020-02-17 MED ORDER — FENTANYL CITRATE (PF) 100 MCG/2ML IJ SOLN: 50.0000 ug | Freq: Once | INTRAMUSCULAR | Status: DC

## 2020-02-17 MED ORDER — STERILE WATER FOR IRRIGATION IR SOLN
Status: DC | PRN
  Administered 2020-02-17: 3000 mL

## 2020-02-17 MED ORDER — MIDAZOLAM HCL 2 MG/2ML IJ SOLN
INTRAMUSCULAR | Status: DC | PRN
  Administered 2020-02-17: 2 mg via INTRAVENOUS

## 2020-02-17 MED ORDER — KETOROLAC TROMETHAMINE 30 MG/ML IJ SOLN
INTRAMUSCULAR | Status: AC
  Filled 2020-02-17: qty 1

## 2020-02-17 MED ORDER — IOHEXOL 300 MG/ML  SOLN
INTRAMUSCULAR | Status: DC | PRN
  Administered 2020-02-17: 10 mL

## 2020-02-17 MED ORDER — PROMETHAZINE HCL 25 MG/ML IJ SOLN: 6.2500 mg | INTRAMUSCULAR | Status: DC | PRN

## 2020-02-17 MED ORDER — LIDOCAINE 2% (20 MG/ML) 5 ML SYRINGE
INTRAMUSCULAR | Status: DC | PRN
  Administered 2020-02-17: 100 mg via INTRAVENOUS

## 2020-02-17 MED ORDER — PROPOFOL 10 MG/ML IV BOLUS
INTRAVENOUS | Status: AC
  Filled 2020-02-17: qty 20

## 2020-02-17 MED ORDER — DEXAMETHASONE SODIUM PHOSPHATE 10 MG/ML IJ SOLN
INTRAMUSCULAR | Status: DC | PRN
  Administered 2020-02-17: 5 mg via INTRAVENOUS

## 2020-02-17 MED ORDER — OXYCODONE HCL 5 MG PO TABS
ORAL_TABLET | ORAL | Status: AC
  Filled 2020-02-17: qty 1

## 2020-02-17 MED ORDER — ONDANSETRON HCL 4 MG/2ML IJ SOLN
INTRAMUSCULAR | Status: AC
  Filled 2020-02-17: qty 2

## 2020-02-17 MED ORDER — OXYCODONE HCL 5 MG PO TABS: 5.0000 mg | ORAL_TABLET | Freq: Once | ORAL | Status: DC | PRN

## 2020-02-17 MED ORDER — ACETAMINOPHEN 10 MG/ML IV SOLN
1000.0000 mg | Freq: Once | INTRAVENOUS | Status: DC | PRN
  Administered 2020-02-17: 1000 mg via INTRAVENOUS

## 2020-02-17 MED ORDER — FENTANYL CITRATE (PF) 100 MCG/2ML IJ SOLN
INTRAMUSCULAR | Status: DC | PRN
  Administered 2020-02-17 (×2): 50 ug via INTRAVENOUS

## 2020-02-17 MED ORDER — LACTATED RINGERS IV SOLN
INTRAVENOUS | Status: DC
  Administered 2020-02-17: 1000 mL via INTRAVENOUS

## 2020-02-17 MED ORDER — MIDAZOLAM HCL 2 MG/2ML IJ SOLN
INTRAMUSCULAR | Status: AC
  Filled 2020-02-17: qty 2

## 2020-02-17 MED ORDER — SODIUM CHLORIDE 0.9 % IV SOLN
INTRAVENOUS | Status: DC
  Administered 2020-02-17 (×2): 1000 mL via INTRAVENOUS

## 2020-02-17 MED ORDER — FENTANYL CITRATE (PF) 100 MCG/2ML IJ SOLN
25.0000 ug | INTRAMUSCULAR | Status: DC | PRN
  Administered 2020-02-17: 25 ug via INTRAVENOUS

## 2020-02-17 MED ORDER — HYDROMORPHONE HCL 1 MG/ML IJ SOLN
INTRAMUSCULAR | Status: AC
  Filled 2020-02-17: qty 1

## 2020-02-17 MED ORDER — OXYCODONE HCL 5 MG/5ML PO SOLN: 5.0000 mg | Freq: Once | ORAL | Status: AC | PRN

## 2020-02-17 MED ORDER — THROMBIN 5000 UNITS EX SOLR
CUTANEOUS | Status: AC
  Filled 2020-02-17: qty 5000

## 2020-02-17 MED ORDER — ONDANSETRON HCL 4 MG/2ML IJ SOLN
INTRAMUSCULAR | Status: DC | PRN
  Administered 2020-02-17: 4 mg via INTRAVENOUS

## 2020-02-17 MED ORDER — LIDOCAINE 2% (20 MG/ML) 5 ML SYRINGE
INTRAMUSCULAR | Status: AC
  Filled 2020-02-17: qty 5

## 2020-02-17 MED ORDER — ACETAMINOPHEN 10 MG/ML IV SOLN
INTRAVENOUS | Status: AC
  Filled 2020-02-17: qty 100

## 2020-02-17 MED ORDER — SODIUM CHLORIDE 0.9 % IV SOLN
2.0000 g | INTRAVENOUS | Status: AC
  Administered 2020-02-17: 2 g via INTRAVENOUS

## 2020-02-17 MED ORDER — ONDANSETRON HCL 4 MG/2ML IJ SOLN: 4.0000 mg | Freq: Once | INTRAMUSCULAR | Status: DC | PRN

## 2020-02-17 MED ORDER — OXYCODONE HCL 5 MG/5ML PO SOLN: 5.0000 mg | Freq: Once | ORAL | Status: DC | PRN

## 2020-02-17 MED ORDER — PROPOFOL 10 MG/ML IV BOLUS
INTRAVENOUS | Status: DC | PRN
  Administered 2020-02-17: 230 mg via INTRAVENOUS

## 2020-02-17 MED ORDER — HYDROMORPHONE HCL 1 MG/ML IJ SOLN
0.5000 mg | INTRAMUSCULAR | Status: DC | PRN
  Administered 2020-02-17 (×2): 0.5 mg via INTRAVENOUS

## 2020-02-17 MED ORDER — FENTANYL CITRATE (PF) 100 MCG/2ML IJ SOLN
INTRAMUSCULAR | Status: AC
Start: 2020-02-17 — End: ?
  Filled 2020-02-17: qty 2

## 2020-02-17 MED ORDER — OXYCODONE HCL 5 MG PO TABS
5.0000 mg | ORAL_TABLET | Freq: Once | ORAL | Status: AC | PRN
  Administered 2020-02-17: 5 mg via ORAL

## 2020-02-17 MED ORDER — CEFTRIAXONE SODIUM 2 G IJ SOLR
INTRAMUSCULAR | Status: AC
  Filled 2020-02-17: qty 20

## 2020-02-17 SURGICAL SUPPLY — 18 items
BAG DRAIN URO-CYSTO SKYTR STRL (DRAIN) ×2 IMPLANT
BAG DRN UROCATH (DRAIN) ×1
CATH URET 5FR 28IN OPEN ENDED (CATHETERS) ×2 IMPLANT
CLOTH BEACON ORANGE TIMEOUT ST (SAFETY) ×2 IMPLANT
ELECT REM PT RETURN 9FT ADLT (ELECTROSURGICAL)
ELECTRODE REM PT RTRN 9FT ADLT (ELECTROSURGICAL) IMPLANT
GLOVE BIO SURGEON STRL SZ 6.5 (GLOVE) ×2 IMPLANT
GLOVE BIOGEL PI IND STRL 7.0 (GLOVE) ×2 IMPLANT
GLOVE BIOGEL PI INDICATOR 7.0 (GLOVE) ×2
GOWN STRL REUS W/TWL LRG LVL3 (GOWN DISPOSABLE) ×4 IMPLANT
GUIDEWIRE STR DUAL SENSOR (WIRE) ×2 IMPLANT
KIT TURNOVER CYSTO (KITS) ×2 IMPLANT
MANIFOLD NEPTUNE II (INSTRUMENTS) ×2 IMPLANT
PACK CYSTO (CUSTOM PROCEDURE TRAY) ×2 IMPLANT
STENT URET 6FRX26 CONTOUR (STENTS) ×2 IMPLANT
TUBE CONNECTING 12X1/4 (SUCTIONS) ×2 IMPLANT
TUBING UROLOGY SET (TUBING) IMPLANT
WATER STERILE IRR 3000ML UROMA (IV SOLUTION) ×2 IMPLANT

## 2020-02-17 NOTE — Discharge Instructions (Signed)
     DISCHARGE INSTRUCTIONS FOR KIDNEY STONE/URETERAL STENT   MEDICATIONS:  1. Resume all your other meds from home  2. Oxycodone is for moderate/severe pain, otherwise taking tylenol (acetaminophen) and ibuprofen can be used 3. Keflex should be taken 3 times a day for infection   ACTIVITY:  1. No strenuous activity x 1week  2. No driving while on narcotic pain medications  3. Drink plenty of water  4. Continue to walk at home - you can still get blood clots when you are at home, so keep active, but don't over do it.  5. May return to work/school tomorrow or when you feel ready   BATHING:  1. You can shower and we recommend daily showers  2. You have a string coming from your urethra: The stent string is attached to your ureteral stent. Do not pull on this.   SIGNS/SYMPTOMS TO CALL:  Please call us if you have a fever greater than 101.5, uncontrolled nausea/vomiting, uncontrolled pain, dizziness, unable to urinate, bloody urine, chest pain, shortness of breath, leg swelling, leg pain, redness around wound, drainage from wound, or any other concerns or questions.   You can reach [place] at [phone].   FOLLOW-UP:  1. You will be contacted by [facility] for a follow up appointment with Dr. Alvester Morin      Post Anesthesia Home Care Instructions  Activity: Get plenty of rest for the remainder of the day. A responsible individual must stay with you for 24 hours following the procedure.  For the next 24 hours, DO NOT: -Drive a car -Advertising copywriter -Drink alcoholic beverages -Take any medication unless instructed by your physician -Make any legal decisions or sign important papers.  Meals: Start with liquid foods such as gelatin or soup. Progress to regular foods as tolerated. Avoid greasy, spicy, heavy foods. If nausea and/or vomiting occur, drink only clear liquids until the nausea and/or vomiting subsides. Call your physician if vomiting continues.  Special  Instructions/Symptoms: Your throat may feel dry or sore from the anesthesia or the breathing tube placed in your throat during surgery. If this causes discomfort, gargle with warm salt water. The discomfort should disappear within 24 hours.

## 2020-02-17 NOTE — Anesthesia Postprocedure Evaluation (Signed)
Anesthesia Post Note  Patient: Albert White  Procedure(s) Performed: CYSTOSCOPY WITH RETROGRADE PYELOGRAM/URETERAL STENT PLACEMENT (Left Renal)     Patient location during evaluation: PACU Anesthesia Type: General Level of consciousness: awake and alert and oriented Pain management: pain level controlled Vital Signs Assessment: post-procedure vital signs reviewed and stable Respiratory status: spontaneous breathing, nonlabored ventilation and respiratory function stable Cardiovascular status: blood pressure returned to baseline and stable Postop Assessment: no apparent nausea or vomiting Anesthetic complications: no   No complications documented.  Last Vitals:  Vitals:   02/17/20 1630 02/17/20 1633  BP: (!) 147/85 140/79  Pulse: 79   Resp: 14   Temp:    SpO2: 95%     Last Pain:  Vitals:   02/17/20 1633  PainSc: 4                  Krishika Bugge A.

## 2020-02-17 NOTE — Anesthesia Preprocedure Evaluation (Addendum)
Anesthesia Evaluation  Patient identified by MRN, date of birth, ID band Patient awake    Reviewed: Allergy & Precautions, NPO status , Patient's Chart, lab work & pertinent test results  Airway Mallampati: II  TM Distance: >3 FB Neck ROM: Full    Dental  (+) Missing, Poor Dentition, Chipped, Dental Advisory Given   Pulmonary neg pulmonary ROS, former smoker,    Pulmonary exam normal breath sounds clear to auscultation       Cardiovascular negative cardio ROS Normal cardiovascular exam Rhythm:Regular Rate:Normal     Neuro/Psych negative neurological ROS  negative psych ROS   GI/Hepatic negative GI ROS, (+)     substance abuse  ,   Endo/Other  diabetesMorbid obesity  Renal/GU negative Renal ROS  negative genitourinary   Musculoskeletal negative musculoskeletal ROS (+)   Abdominal   Peds negative pediatric ROS (+)  Hematology negative hematology ROS (+)   Anesthesia Other Findings   Reproductive/Obstetrics negative OB ROS                            Anesthesia Physical Anesthesia Plan  ASA: II  Anesthesia Plan: General   Post-op Pain Management:    Induction: Intravenous  PONV Risk Score and Plan: 2 and Ondansetron, Dexamethasone and Treatment may vary due to age or medical condition  Airway Management Planned: LMA  Additional Equipment:   Intra-op Plan:   Post-operative Plan: Extubation in OR  Informed Consent: I have reviewed the patients History and Physical, chart, labs and discussed the procedure including the risks, benefits and alternatives for the proposed anesthesia with the patient or authorized representative who has indicated his/her understanding and acceptance.     Dental advisory given  Plan Discussed with: CRNA and Surgeon  Anesthesia Plan Comments:         Anesthesia Quick Evaluation

## 2020-02-17 NOTE — Transfer of Care (Signed)
Immediate Anesthesia Transfer of Care Note  Patient: NIDAL RIVET  Procedure(s) Performed: Procedure(s) (LRB): CYSTOSCOPY WITH RETROGRADE PYELOGRAM/URETERAL STENT PLACEMENT (Left)  Patient Location: PACU  Anesthesia Type: General  Level of Consciousness: awake, oriented, sedated and patient cooperative  Airway & Oxygen Therapy: Patient Spontanous Breathing and Patient connected to face mask oxygen  Post-op Assessment: Report given to PACU RN and Post -op Vital signs reviewed and stable  Post vital signs: Reviewed and stable  Complications: No apparent anesthesia complications Last Vitals:  Vitals Value Taken Time  BP    Temp 36.6 C 02/17/20 1530  Pulse 88 02/17/20 1533  Resp 18 02/17/20 1533  SpO2 97 % 02/17/20 1533  Vitals shown include unvalidated device data.  Last Pain:  Vitals:   02/17/20 1438  PainSc: 9       Patients Stated Pain Goal: 7 (02/17/20 1438)  Complications: No complications documented.

## 2020-02-17 NOTE — Op Note (Signed)
Preoperative diagnosis:  1. Left ureteral calculi following ESWL  Postoperative diagnosis:  1. Same  Procedure:  1. Cystoscopy 2. Left ureteral stent placement (6Fr x 26cm - no string)       3.   Left retrograde pyelography with interpretation       4.   Tight ureter, stent placed with moderate resistance  Surgeon: Kasandra Knudsen, MD  Anesthesia: General  Complications: None  Intraoperative findings:  Left retrograde pyelography demonstrated a filling defect within the left ureter consistent with the patient's known calculus without other abnormalities.  EBL: Minimal  Specimens: None  Indication: Albert White is a 44 y.o. patient who developed flank pain PPD1 s/p LEFT ESWL.  CT abd/pelvis showed distal ureteral calculi as well as renal calculi.  Patient had uncontrolled pain and urine was borderline infected. After reviewing the management options for treatment, he elected to proceed with the above surgical procedure(s). We have discussed the potential benefits and risks of the procedure, side effects of the proposed treatment, the likelihood of the patient achieving the goals of the procedure, and any potential problems that might occur during the procedure or recuperation. Informed consent has been obtained.  Description of procedure:  The patient was taken to the operating room and general anesthesia was induced.  The patient was placed in the dorsal lithotomy position, prepped and draped in the usual sterile fashion, and preoperative antibiotics were administered. A preoperative time-out was performed.   Cystourethroscopy was performed.  The patient's urethra was examined and was normal. The bladder was then systematically examined in its entirety. There was no evidence for any bladder tumors, stones, or other mucosal pathology.    Attention then turned to the left ureteral orifice and a ureteral catheter was used to intubate the ureteral orifice.  Omnipaque contrast was  injected through the ureteral catheter and a retrograde pyelogram was performed with findings as dictated above.  A 0.38 sensor guidewire was then advanced up the left ureter into the renal pelvis under fluoroscopic guidance.  The wire was then backloaded through the cystoscope and a ureteral stent was advance over the wire using Seldinger technique.  The stent was positioned appropriately under fluoroscopic and cystoscopic guidance.  The wire was then removed with an adequate stent curl noted in the renal pelvis as well as in the bladder.  Urine was seen effluxing from the distal curl in the bladder.   The bladder was then emptied and the procedure ended.  The patient appeared to tolerate the procedure well and without complications.  The patient was able to be awakened and transferred to the recovery unit in satisfactory condition.    Kasandra Knudsen, M.D.

## 2020-02-17 NOTE — Anesthesia Procedure Notes (Signed)
Procedure Name: LMA Insertion Date/Time: 02/17/2020 3:06 PM Performed by: Francie Massing, CRNA Pre-anesthesia Checklist: Patient identified, Emergency Drugs available, Suction available and Patient being monitored Patient Re-evaluated:Patient Re-evaluated prior to induction Oxygen Delivery Method: Circle system utilized Preoxygenation: Pre-oxygenation with 100% oxygen Induction Type: IV induction Ventilation: Mask ventilation without difficulty LMA: LMA inserted LMA Size: 5.0 Number of attempts: 1 Airway Equipment and Method: Bite block Placement Confirmation: positive ETCO2 Tube secured with: Tape Dental Injury: Teeth and Oropharynx as per pre-operative assessment

## 2020-02-17 NOTE — Interval H&P Note (Signed)
History and Physical Interval Note: Patient underwent L ESWL yesterday and returns to the office today with significant pain and rigors.  Repeat CT shows some small distal calculi and urine with WBCs and few bacteria.  Given patient clinical picture we discuss proceeding with left ureteral stent placement.    02/17/2020 1:43 PM  Albert White  has presented today for surgery, with the diagnosis of LEFT URETERAL CALCULUS.  The various methods of treatment have been discussed with the patient and family. After consideration of risks, benefits and other options for treatment, the patient has consented to  Procedure(s) with comments: CYSTOSCOPY WITH RETROGRADE PYELOGRAM/URETERAL STENT PLACEMENT (Left) - 30 MINS as a surgical intervention.  The patient's history has been reviewed, patient examined, no change in status, stable for surgery.  I have reviewed the patient's chart and labs.  Questions were answered to the patient's satisfaction.     Albert White

## 2020-02-18 ENCOUNTER — Emergency Department (HOSPITAL_COMMUNITY): Payer: 59

## 2020-02-18 ENCOUNTER — Encounter (HOSPITAL_COMMUNITY): Payer: Self-pay

## 2020-02-18 ENCOUNTER — Emergency Department (HOSPITAL_COMMUNITY)
Admission: EM | Admit: 2020-02-18 | Discharge: 2020-02-18 | Disposition: A | Discharge status: Home / Self Care | Payer: 59 | Attending: Emergency Medicine | Admitting: Emergency Medicine

## 2020-02-18 DIAGNOSIS — Z87891 Personal history of nicotine dependence: Secondary | ICD-10-CM | POA: Diagnosis not present

## 2020-02-18 DIAGNOSIS — N202 Calculus of kidney with calculus of ureter: Secondary | ICD-10-CM | POA: Diagnosis not present

## 2020-02-18 DIAGNOSIS — R109 Unspecified abdominal pain: Secondary | ICD-10-CM

## 2020-02-18 DIAGNOSIS — R1032 Left lower quadrant pain: Secondary | ICD-10-CM | POA: Diagnosis present

## 2020-02-18 DIAGNOSIS — E119 Type 2 diabetes mellitus without complications: Secondary | ICD-10-CM | POA: Insufficient documentation

## 2020-02-18 DIAGNOSIS — N200 Calculus of kidney: Secondary | ICD-10-CM

## 2020-02-18 LAB — CBC WITH DIFFERENTIAL/PLATELET
Abs Immature Granulocytes: 0.05 10*3/uL (ref 0.00–0.07)
Basophils Absolute: 0 10*3/uL (ref 0.0–0.1)
Basophils Relative: 0 %
Eosinophils Absolute: 0 10*3/uL (ref 0.0–0.5)
Eosinophils Relative: 0 %
HCT: 40.6 % (ref 39.0–52.0)
Hemoglobin: 14.3 g/dL (ref 13.0–17.0)
Immature Granulocytes: 0 %
Lymphocytes Relative: 10 %
Lymphs Abs: 1.2 10*3/uL (ref 0.7–4.0)
MCH: 30.7 pg (ref 26.0–34.0)
MCHC: 35.2 g/dL (ref 30.0–36.0)
MCV: 87.1 fL (ref 80.0–100.0)
Monocytes Absolute: 0.8 10*3/uL (ref 0.1–1.0)
Monocytes Relative: 6 %
Neutro Abs: 10.1 10*3/uL — ABNORMAL HIGH (ref 1.7–7.7)
Neutrophils Relative %: 84 %
Platelets: 216 10*3/uL (ref 150–400)
RBC: 4.66 MIL/uL (ref 4.22–5.81)
RDW: 12.3 % (ref 11.5–15.5)
WBC: 12.1 10*3/uL — ABNORMAL HIGH (ref 4.0–10.5)
nRBC: 0 % (ref 0.0–0.2)

## 2020-02-18 LAB — URINALYSIS, ROUTINE W REFLEX MICROSCOPIC
Bacteria, UA: NONE SEEN
Bilirubin Urine: NEGATIVE
Glucose, UA: NEGATIVE mg/dL
Ketones, ur: 20 mg/dL — AB
Nitrite: NEGATIVE
Protein, ur: 100 mg/dL — AB
RBC / HPF: >50 RBC/hpf — ABNORMAL HIGH (ref 0–5)
Specific Gravity, Urine: 1.023 (ref 1.005–1.030)
pH: 6 (ref 5.0–8.0)

## 2020-02-18 LAB — COMPREHENSIVE METABOLIC PANEL
ALT: 13 U/L (ref 0–44)
AST: 17 U/L (ref 15–41)
Albumin: 4 g/dL (ref 3.5–5.0)
Alkaline Phosphatase: 71 U/L (ref 38–126)
Anion gap: 16 — ABNORMAL HIGH (ref 5–15)
BUN: 13 mg/dL (ref 6–20)
CO2: 20 mmol/L — ABNORMAL LOW (ref 22–32)
Calcium: 9.2 mg/dL (ref 8.9–10.3)
Chloride: 103 mmol/L (ref 98–111)
Creatinine, Ser: 0.91 mg/dL (ref 0.61–1.24)
GFR calc Af Amer: >60 mL/min (ref >60)
GFR calc non Af Amer: >60 mL/min (ref >60)
Glucose, Bld: 134 mg/dL — ABNORMAL HIGH (ref 70–99)
Potassium: 3.8 mmol/L (ref 3.5–5.1)
Sodium: 139 mmol/L (ref 135–145)
Total Bilirubin: 0.8 mg/dL (ref 0.3–1.2)
Total Protein: 6.8 g/dL (ref 6.5–8.1)

## 2020-02-18 MED ORDER — OXYCODONE-ACETAMINOPHEN 5-325 MG PO TABS: 1.0000 | ORAL_TABLET | ORAL | 0 refills | Status: DC | PRN

## 2020-02-18 MED ORDER — ONDANSETRON HCL 4 MG/2ML IJ SOLN
4.0000 mg | Freq: Once | INTRAMUSCULAR | Status: AC
  Administered 2020-02-18: 4 mg via INTRAVENOUS
  Filled 2020-02-18: qty 2

## 2020-02-18 MED ORDER — ONDANSETRON 4 MG PO TBDP: 4.0000 mg | ORAL_TABLET | Freq: Three times a day (TID) | ORAL | 0 refills | Status: DC | PRN

## 2020-02-18 MED ORDER — SODIUM CHLORIDE 0.9 % IV BOLUS
1000.0000 mL | Freq: Once | INTRAVENOUS | Status: AC
  Administered 2020-02-18: 1000 mL via INTRAVENOUS

## 2020-02-18 MED ORDER — HYDROMORPHONE HCL 1 MG/ML IJ SOLN
1.0000 mg | Freq: Once | INTRAMUSCULAR | Status: AC
  Administered 2020-02-18: 1 mg via INTRAVENOUS
  Filled 2020-02-18: qty 1

## 2020-02-18 MED ORDER — MORPHINE SULFATE (PF) 4 MG/ML IV SOLN
4.0000 mg | Freq: Once | INTRAVENOUS | Status: AC
  Administered 2020-02-18: 4 mg via INTRAVENOUS
  Filled 2020-02-18: qty 1

## 2020-02-18 MED ORDER — SODIUM CHLORIDE 0.9 % IV SOLN
1.0000 g | Freq: Once | INTRAVENOUS | Status: AC
  Administered 2020-02-18: 1 g via INTRAVENOUS
  Filled 2020-02-18: qty 10

## 2020-02-18 NOTE — ED Provider Notes (Addendum)
[facility]-EMERGENCY DEPT Provider Note   CSN: [id number] Arrival date & time: 02/18/20  1113     History Chief Complaint  Patient presents with  . Post-op Problem    Albert White is a 44 y.o. male.  Status post ureteral stent placement yesterday at approximately 1500; patient now presents with left flank pain with radiation to the genitalia, inability to keep fluids down.  He is also status post  ESWL on 02/16/2020.  Severity of pain is moderate.  Nothing makes symptoms better or worse.        Past Medical History:  Diagnosis Date  . Abscess   . Diabetes mellitus without complication (HCC)    no problem since gastric bypass  . Hidradenitis suppurativa   . Hyperlipemia   . Morbid obesity (HCC)   . Vertigo     Patient Active Problem List   Diagnosis Date Noted  . Choledocholithiasis 06/06/2018  . Polysubstance (excluding opioids) dependence, daily use (HCC) 06/24/2017  . MDD (major depressive disorder), recurrent severe, without psychosis (HCC) 06/22/2017  . Major depressive disorder, single episode, severe (HCC) 06/21/2017  . Polysubstance abuse (HCC) 06/21/2017  . Pain management   . Hypomagnesemia   . Acute pulmonary edema (HCC)   . Peritonitis (HCC)   . Gastrointestinal anastomotic leak   . Pleural effusion on left   . Postprocedural intraabdominal abscess 01/15/2017  . Increased band cell count 01/06/2017  . Generalized edema 01/06/2017  . Former smoker 08/08/2016  . Morbid obesity with BMI of 50.0-59.9, adult (HCC) 06/25/2016  . OSA (obstructive sleep apnea) 06/25/2016  . Vitamin D deficiency 06/25/2016  . Type 2 diabetes mellitus without complication, without long-term current use of insulin (HCC) 02/29/2016  . Mixed hyperlipidemia 02/29/2016  . Cellulitis and abscess of neck 02/29/2016  . Cellulitis 12/14/2013  . Morbid obesity (HCC) 12/14/2013  . Neck abscess 12/14/2013  . Hidradenitis 08/09/2012    Past Surgical History:    Procedure Laterality Date  . CHOLECYSTECTOMY N/A 06/09/2018   Procedure: LAPAROSCOPIC CHOLECYSTECTOMY WITH   INTRAOPERATIVE CHOLANGIOGRAM,;  Surgeon: Andria Meuse, MD;  Location: [facility] OR;  Service: General;  Laterality: N/A;  . EXTRACORPOREAL SHOCK WAVE LITHOTRIPSY Left 02/16/2020   Procedure: EXTRACORPOREAL SHOCK WAVE LITHOTRIPSY (ESWL);  Surgeon: Alfredo Martinez, MD;  Location: [facility];  Service: Urology;  Laterality: Left;  Marland Kitchen GASTRIC BYPASS  11/2016  . INCISION AND DRAINAGE ABSCESS Right 12/15/2013   Procedure: INCISION AND DRAINAGE Right Posterior neck ABSCESS;  Surgeon: Cherylynn Ridges, MD;  Location: [facility] OR;  Service: General;  Laterality: Right;  . INCISION AND DRAINAGE DEEP NECK ABSCESS     Hidradenitis - 3-4 times at [facility] and [place]  . IR CATHETER TUBE CHANGE  02/18/2017  . IR RADIOLOGIST EVAL & MGMT  02/11/2017  . IR RADIOLOGIST EVAL & MGMT  03/03/2017  . IR THORACENTESIS ASP PLEURAL SPACE W/IMG GUIDE  01/23/2017  . LAPAROSCOPIC LYSIS OF ADHESIONS N/A 06/09/2018   Procedure: LAPAROSCOPIC LYSIS OF ADHESIONS;  Surgeon: Andria Meuse, MD;  Location: [facility] OR;  Service: General;  Laterality: N/A;       Family History  Problem Relation Age of Onset  . Arthritis Mother   . Hypertension Mother   . Cancer Other   . Heart disease Other     Social History   Tobacco Use  . Smoking status: Former Smoker    Packs/day: 1.00    Quit date: 11/27/2013    Years since quitting: 6.2  .  Smokeless tobacco: Never Used  Vaping Use  . Vaping Use: Never used  Substance Use Topics  . Alcohol use: No  . Drug use: Yes    Types: Cocaine    Home Medications Prior to Admission medications   Medication Sig Start Date End Date Taking? Authorizing Provider  cephALEXin (KEFLEX) 500 MG capsule Take 1 capsule (500 mg total) by mouth 3 (three) times daily for 10 days. 02/17/20 02/27/20 Yes Pace, Weyman Croon, MD  oxyCODONE (ROXICODONE) 5 MG immediate release tablet Take 1 tablet  (5 mg total) by mouth every 4 (four) hours as needed for severe pain. 02/17/20 02/16/21 Yes Pace, Weyman Croon, MD  pantoprazole (PROTONIX) 40 MG tablet Take 40 mg by mouth daily.   Yes [provider]  sertraline (ZOLOFT) 50 MG tablet Take 50 mg by mouth daily.   Yes [provider]  tamsulosin (FLOMAX) 0.4 MG CAPS capsule Take 0.4 mg by mouth daily.    Yes [provider]  traZODone (DESYREL) 100 MG tablet Take 100 mg by mouth at bedtime.   Yes [provider]    Allergies    Ibuprofen and Nsaids  Review of Systems   Review of Systems  All other systems reviewed and are negative.   Physical Exam Updated Vital Signs BP (!) 143/78   Pulse 85   Temp 98.6 F (37 C) (Oral)   Resp 20   Ht 5\' 10"  (1.778 m)   Wt 116.1 kg   SpO2 99%   BMI 36.73 kg/m   Physical Exam Vitals and nursing note reviewed.  Constitutional:      Appearance: He is well-developed.     Comments: In pain  HENT:     Head: Normocephalic and atraumatic.  Eyes:     Conjunctiva/sclera: Conjunctivae normal.  Cardiovascular:     Rate and Rhythm: Normal rate and regular rhythm.  Pulmonary:     Effort: Pulmonary effort is normal.     Breath sounds: Normal breath sounds.  Abdominal:     General: Bowel sounds are normal.     Palpations: Abdomen is soft.  Genitourinary:    Comments: Tender left flank, left lower quadrant Musculoskeletal:        General: Normal range of motion.     Cervical back: Neck supple.  Skin:    General: Skin is warm and dry.  Neurological:     General: No focal deficit present.     Mental Status: He is alert and oriented to person, place, and time.  Psychiatric:        Behavior: Behavior normal.     ED Results / Procedures / Treatments   Labs (all labs ordered are listed, but only abnormal results are displayed) Labs Reviewed  CBC WITH DIFFERENTIAL/PLATELET - Abnormal; Notable for the following components:      Result Value   WBC 12.1 (*)     Neutro Abs 10.1 (*)    All other components within normal limits  URINALYSIS, ROUTINE W REFLEX MICROSCOPIC - Abnormal; Notable for the following components:   APPearance CLOUDY (*)    Hgb urine dipstick LARGE (*)    Ketones, ur 20 (*)    Protein, ur 100 (*)    Leukocytes,Ua SMALL (*)    RBC / HPF >50 (*)    All other components within normal limits  COMPREHENSIVE METABOLIC PANEL - Abnormal; Notable for the following components:   CO2 20 (*)    Glucose, Bld 134 (*)    Anion  gap 16 (*)    All other components within normal limits  CULTURE, BLOOD (ROUTINE X 2)  CULTURE, BLOOD (ROUTINE X 2)    EKG None  Radiology CT Renal Stone Study  Result Date: 02/18/2020 CLINICAL DATA:  Patient status post left ureteral stent placement yesterday. Onset chills and vomiting at 1 a.m. last night. EXAM: CT ABDOMEN AND PELVIS WITHOUT CONTRAST TECHNIQUE: Multidetector CT imaging of the abdomen and pelvis was performed following the standard protocol without IV contrast. COMPARISON:  CT abdomen and pelvis 01/17/2020. FINDINGS: Lower chest: Lung bases clear.  No pleural or pericardial effusion. Hepatobiliary: No focal liver abnormality is seen. Status post cholecystectomy. No biliary dilatation. Pancreas: Unremarkable. No pancreatic ductal dilatation or surrounding inflammatory changes. Spleen: Normal in size without focal abnormality. Adrenals/Urinary Tract: Since the prior examination, a left double-J J ureteral stent has been placed. The stent projects in good position. There is no hydronephrosis and no ureteral or urinary bladder stones are identified. Small stones in the lower pole of the left kidney measure up to 0.5 cm. The patient has a hyperattenuating subcapsular hematoma along the lateral periphery of the left kidney. The hematoma extends from almost the entire length of the kidney and measures approximately 7.1 cm AP by 1.1 cm transverse. No intraparenchymal hematoma in the left kidney is identified. The  right kidney and ureter are unremarkable. The adrenal glands appear normal. Stomach/Bowel: Stomach is within normal limits. The appendix is not visualized and may have been removed. No evidence of appendicitis is present. No evidence of bowel wall thickening, distention, or inflammatory changes. The patient is status post bariatric surgery. Vascular/Lymphatic: Aortic atherosclerosis. No enlarged abdominal or pelvic lymph nodes. Reproductive: Prostate is unremarkable. Other: Small fat containing umbilical hernia. Musculoskeletal: No acute or focal abnormality. Lower thoracic degenerative change is noted. IMPRESSION: The examination is positive for an acute subcapsular hematoma of the left kidney as described. Left double-J ureteral stent is in good position. Negative for hydronephrosis or ureteral stone. A cluster of small, nonobstructing stones is seen in the lower pole of the left kidney. Fat containing umbilical hernia. Aortic Atherosclerosis (ICD10-I70.0). Electronically Signed   By: Drusilla Kanner M.D.   On: 02/18/2020 16:53    Procedures Procedures (including critical care time)  Medications Ordered in ED Medications  sodium chloride 0.9 % bolus 1,000 mL (0 mLs Intravenous Stopped 02/18/20 1935)  ondansetron (ZOFRAN) injection 4 mg (4 mg Intravenous Given 02/18/20 1537)  HYDROmorphone (DILAUDID) injection 1 mg (1 mg Intravenous Given 02/18/20 1530)  cefTRIAXone (ROCEPHIN) 1 g in sodium chloride 0.9 % 100 mL IVPB (0 g Intravenous Stopped 02/18/20 1935)  HYDROmorphone (DILAUDID) injection 1 mg (1 mg Intravenous Given 02/18/20 1718)  sodium chloride 0.9 % bolus 1,000 mL (0 mLs Intravenous Stopped 02/18/20 1935)  ondansetron (ZOFRAN) injection 4 mg (4 mg Intravenous Given 02/18/20 1718)  HYDROmorphone (DILAUDID) injection 1 mg (1 mg Intravenous Given 02/18/20 2102)  morphine 4 MG/ML injection 4 mg (4 mg Intravenous Given 02/18/20 1940)    ED Course  I have reviewed the triage vital signs and the  nursing notes.  Pertinent labs & imaging results that were available during my care of the patient were reviewed by me and considered in my medical decision making (see chart for details).    MDM Rules/Calculators/A&P                          Suspect retained stone fragments.  Will hydrate, treat pain,  IV antibiotics, CT renal, consult urology.  Patient rechecked multiple times.  Good pain management.  Discussed with the urologist on-call Dr. Arita MissPace.  Patient stable for outpatient management. Final Clinical Impression(s) / ED Diagnoses Final diagnoses:  Left flank pain  Kidney stone on left side    Rx / DC Orders ED Discharge Orders    None       Donnetta Hutchingook, Gaines Cartmell, MD 02/18/20 1518    Donnetta Hutchingook, Andrian Sabala, MD 02/18/20 2207

## 2020-02-18 NOTE — ED Triage Notes (Signed)
Pt presents with c/o post-op complication. Pt had a stent placed in his left kidney yesterday and around 1 am this morning, he began to have chills and vomiting. Surgeon on call told pt to come to the ER. Pt c/o pain in his left kidney and left groin area.

## 2020-02-18 NOTE — ED Notes (Signed)
Patient provided with ginger ale for PO challenge.

## 2020-02-18 NOTE — Discharge Instructions (Signed)
Your case was discussed with the urologist Dr. Arita Miss.  Will discharge home with pain and nausea medication.  Follow-up with your urologist.

## 2020-02-20 ENCOUNTER — Encounter (HOSPITAL_BASED_OUTPATIENT_CLINIC_OR_DEPARTMENT_OTHER): Payer: Self-pay | Admitting: Urology

## 2020-02-20 ENCOUNTER — Other Ambulatory Visit: Payer: Self-pay

## 2020-02-20 ENCOUNTER — Inpatient Hospital Stay (HOSPITAL_COMMUNITY)
Admission: AD | Admit: 2020-02-20 | Discharge: 2020-02-22 | DRG: 661 | Disposition: A | Discharge status: Home / Self Care | Payer: 59 | Source: Ambulatory Visit | Attending: Urology | Admitting: Urology

## 2020-02-20 DIAGNOSIS — N23 Unspecified renal colic: Secondary | ICD-10-CM | POA: Diagnosis present

## 2020-02-20 DIAGNOSIS — E119 Type 2 diabetes mellitus without complications: Secondary | ICD-10-CM | POA: Diagnosis present

## 2020-02-20 DIAGNOSIS — Z9049 Acquired absence of other specified parts of digestive tract: Secondary | ICD-10-CM

## 2020-02-20 DIAGNOSIS — L732 Hidradenitis suppurativa: Secondary | ICD-10-CM | POA: Diagnosis present

## 2020-02-20 DIAGNOSIS — Z87442 Personal history of urinary calculi: Secondary | ICD-10-CM

## 2020-02-20 DIAGNOSIS — N202 Calculus of kidney with calculus of ureter: Principal | ICD-10-CM | POA: Diagnosis present

## 2020-02-20 DIAGNOSIS — Z9884 Bariatric surgery status: Secondary | ICD-10-CM

## 2020-02-20 DIAGNOSIS — Z886 Allergy status to analgesic agent status: Secondary | ICD-10-CM

## 2020-02-20 DIAGNOSIS — Z87891 Personal history of nicotine dependence: Secondary | ICD-10-CM

## 2020-02-20 DIAGNOSIS — Z20822 Contact with and (suspected) exposure to covid-19: Secondary | ICD-10-CM | POA: Diagnosis present

## 2020-02-20 DIAGNOSIS — E785 Hyperlipidemia, unspecified: Secondary | ICD-10-CM | POA: Diagnosis present

## 2020-02-20 LAB — CBC
HCT: 39.9 % (ref 39.0–52.0)
Hemoglobin: 13.8 g/dL (ref 13.0–17.0)
MCH: 30.1 pg (ref 26.0–34.0)
MCHC: 34.6 g/dL (ref 30.0–36.0)
MCV: 86.9 fL (ref 80.0–100.0)
Platelets: 206 10*3/uL (ref 150–400)
RBC: 4.59 MIL/uL (ref 4.22–5.81)
RDW: 11.9 % (ref 11.5–15.5)
WBC: 6.4 10*3/uL (ref 4.0–10.5)
nRBC: 0 % (ref 0.0–0.2)

## 2020-02-20 LAB — BASIC METABOLIC PANEL
Anion gap: 9 (ref 5–15)
BUN: 15 mg/dL (ref 6–20)
CO2: 24 mmol/L (ref 22–32)
Calcium: 8.3 mg/dL — ABNORMAL LOW (ref 8.9–10.3)
Chloride: 103 mmol/L (ref 98–111)
Creatinine, Ser: 0.86 mg/dL (ref 0.61–1.24)
GFR calc Af Amer: >60 mL/min (ref >60)
GFR calc non Af Amer: >60 mL/min (ref >60)
Glucose, Bld: 187 mg/dL — ABNORMAL HIGH (ref 70–99)
Potassium: 3.3 mmol/L — ABNORMAL LOW (ref 3.5–5.1)
Sodium: 136 mmol/L (ref 135–145)

## 2020-02-20 MED ORDER — DOCUSATE SODIUM 100 MG PO CAPS
100.0000 mg | ORAL_CAPSULE | Freq: Two times a day (BID) | ORAL | Status: DC
  Administered 2020-02-21 – 2020-02-22 (×2): 100 mg via ORAL
  Filled 2020-02-20 (×3): qty 1

## 2020-02-20 MED ORDER — OXYCODONE HCL 5 MG PO TABS
5.0000 mg | ORAL_TABLET | ORAL | Status: DC | PRN
  Administered 2020-02-21 – 2020-02-22 (×4): 5 mg via ORAL
  Filled 2020-02-20 (×4): qty 1

## 2020-02-20 MED ORDER — CEPHALEXIN 500 MG PO CAPS
500.0000 mg | ORAL_CAPSULE | Freq: Three times a day (TID) | ORAL | Status: DC
  Administered 2020-02-21 – 2020-02-22 (×3): 500 mg via ORAL
  Filled 2020-02-20 (×4): qty 1

## 2020-02-20 MED ORDER — PANTOPRAZOLE SODIUM 40 MG PO TBEC
40.0000 mg | DELAYED_RELEASE_TABLET | Freq: Every day | ORAL | Status: DC
  Administered 2020-02-22: 40 mg via ORAL
  Filled 2020-02-20: qty 1

## 2020-02-20 MED ORDER — ONDANSETRON HCL 4 MG/2ML IJ SOLN
4.0000 mg | INTRAMUSCULAR | Status: DC | PRN
  Administered 2020-02-20 – 2020-02-21 (×4): 4 mg via INTRAVENOUS
  Filled 2020-02-20 (×4): qty 2

## 2020-02-20 MED ORDER — SENNA 8.6 MG PO TABS
1.0000 | ORAL_TABLET | Freq: Two times a day (BID) | ORAL | Status: DC
  Administered 2020-02-21 – 2020-02-22 (×2): 8.6 mg via ORAL
  Filled 2020-02-20 (×3): qty 1

## 2020-02-20 MED ORDER — MORPHINE SULFATE (PF) 2 MG/ML IV SOLN
2.0000 mg | INTRAVENOUS | Status: DC | PRN
  Administered 2020-02-20 – 2020-02-21 (×7): 4 mg via INTRAVENOUS
  Filled 2020-02-20 (×7): qty 2

## 2020-02-20 MED ORDER — TRAZODONE HCL 100 MG PO TABS
100.0000 mg | ORAL_TABLET | Freq: Every day | ORAL | Status: DC
  Administered 2020-02-21: 100 mg via ORAL
  Filled 2020-02-20 (×2): qty 1

## 2020-02-20 MED ORDER — KETOROLAC TROMETHAMINE 15 MG/ML IJ SOLN: 15.0000 mg | Freq: Four times a day (QID) | INTRAMUSCULAR | Status: DC

## 2020-02-20 MED ORDER — ACETAMINOPHEN 500 MG PO TABS
1000.0000 mg | ORAL_TABLET | Freq: Four times a day (QID) | ORAL | Status: DC
  Administered 2020-02-21 – 2020-02-22 (×3): 1000 mg via ORAL
  Filled 2020-02-20 (×4): qty 2

## 2020-02-20 MED ORDER — TAMSULOSIN HCL 0.4 MG PO CAPS
0.4000 mg | ORAL_CAPSULE | Freq: Every day | ORAL | Status: DC
  Administered 2020-02-22: 0.4 mg via ORAL
  Filled 2020-02-20: qty 1

## 2020-02-20 MED ORDER — DEXTROSE-NACL 5-0.45 % IV SOLN: INTRAVENOUS | Status: DC

## 2020-02-20 MED ORDER — BELLADONNA ALKALOIDS-OPIUM 16.2-60 MG RE SUPP: 1.0000 | Freq: Four times a day (QID) | RECTAL | Status: DC | PRN

## 2020-02-20 MED ORDER — SERTRALINE HCL 50 MG PO TABS
50.0000 mg | ORAL_TABLET | Freq: Every day | ORAL | Status: DC
  Administered 2020-02-22: 50 mg via ORAL
  Filled 2020-02-20: qty 1

## 2020-02-20 MED ORDER — OXYBUTYNIN CHLORIDE 5 MG PO TABS: 5.0000 mg | ORAL_TABLET | Freq: Three times a day (TID) | ORAL | Status: DC | PRN

## 2020-02-20 NOTE — Plan of Care (Signed)

## 2020-02-20 NOTE — H&P (Signed)
H&P  Chief Complaint: Left renal colic  History of Present Illness: Albert White is a 44 y.o. year old male with a history of kidney stones. He is s/p left ESWL on 02/16/2020 for a 13 mm UPJ calculus. He subsequently developed intractable left-sided flank pain and vomiting that required left ureteral stent placement on 02/17/2020 with Dr. Arita Miss. Following stent placement, the patient's flank pain and vomiting did not subside, which prompted another visit to the emergency department on 02/18/2020. CT from 02/18/2020 showed a small left perinephric hematoma with his left ureteral stent in good position. No other overt GU abnormalities were identified. Today, the patient continues to have intractable vomiting and continuous left-sided flank pain that have not improved with oral Dilaudid or Zofran suppositories. He denies any fevers at home and was afebrile in the emergency room. He is afebrile in the office today.    Past Medical History:  Diagnosis Date  . Abscess   . Diabetes mellitus without complication (HCC)    no problem since gastric bypass  . Hidradenitis suppurativa   . Hyperlipemia   . Morbid obesity (HCC)   . Vertigo     Past Surgical History:  Procedure Laterality Date  . CHOLECYSTECTOMY N/A 06/09/2018   Procedure: LAPAROSCOPIC CHOLECYSTECTOMY WITH   INTRAOPERATIVE CHOLANGIOGRAM,;  Surgeon: Andria Meuse, MD;  Location: [facility] OR;  Service: General;  Laterality: N/A;  . CYSTOSCOPY W/ URETERAL STENT PLACEMENT Left 02/17/2020   Procedure: CYSTOSCOPY WITH RETROGRADE PYELOGRAM/URETERAL STENT PLACEMENT;  Surgeon: Noel Christmas, MD;  Location: [facility];  Service: Urology;  Laterality: Left;  . EXTRACORPOREAL SHOCK WAVE LITHOTRIPSY Left 02/16/2020   Procedure: EXTRACORPOREAL SHOCK WAVE LITHOTRIPSY (ESWL);  Surgeon: Alfredo Martinez, MD;  Location: [facility];  Service: Urology;  Laterality: Left;  Marland Kitchen GASTRIC BYPASS  11/2016  . INCISION AND  DRAINAGE ABSCESS Right 12/15/2013   Procedure: INCISION AND DRAINAGE Right Posterior neck ABSCESS;  Surgeon: Cherylynn Ridges, MD;  Location: [facility] OR;  Service: General;  Laterality: Right;  . INCISION AND DRAINAGE DEEP NECK ABSCESS     Hidradenitis - 3-4 times at [facility] and [place]  . IR CATHETER TUBE CHANGE  02/18/2017  . IR RADIOLOGIST EVAL & MGMT  02/11/2017  . IR RADIOLOGIST EVAL & MGMT  03/03/2017  . IR THORACENTESIS ASP PLEURAL SPACE W/IMG GUIDE  01/23/2017  . LAPAROSCOPIC LYSIS OF ADHESIONS N/A 06/09/2018   Procedure: LAPAROSCOPIC LYSIS OF ADHESIONS;  Surgeon: Andria Meuse, MD;  Location: [facility] OR;  Service: General;  Laterality: N/A;    Home Medications:  No outpatient medications have been marked as taking for the 02/20/20 encounter [facility] Encounter).    Allergies:  Allergies  Allergen Reactions  . Ibuprofen Other (See Comments)    Has bar  . Nsaids     affects kidney    Family History  Problem Relation Age of Onset  . Arthritis Mother   . Hypertension Mother   . Cancer Other   . Heart disease Other     Social History:  reports that he quit smoking about 6 years ago. He smoked 1.00 pack per day. He has never used smokeless tobacco. He reports current drug use. Drugs:  and Cocaine. He reports that he does not drink alcohol.  ROS: A complete review of systems was performed.  All systems are negative except for pertinent findings as noted.  Physical Exam:  Vital signs in last 24 hours:   Constitutional:  Alert and oriented, No acute  distress Cardiovascular: Regular rate and rhythm, No JVD Respiratory: Normal respiratory effort, Lungs clear bilaterally GI: Abdomen is soft, nontender, nondistended, no abdominal masses GU: Left CVAT Lymphatic: No lymphadenopathy Neurologic: Grossly intact, no focal deficits Psychiatric: Normal mood and affect    Laboratory Data:  Recent Labs    02/18/20 1545  WBC 12.1*  HGB 14.3  HCT 40.6  PLT 216    Recent Labs     02/18/20 1545  NA 139  K 3.8  CL 103  GLUCOSE 134*  BUN 13  CALCIUM 9.2  CREATININE 0.91     No results found for this or any previous visit (from the past 24 hour(s)). Recent Results (from the past 240 hour(s))  SARS CORONAVIRUS 2 (TAT 6-24 HRS) Nasopharyngeal Nasopharyngeal Swab     Status: None   Collection Time: 02/13/20  2:58 PM   Specimen: Nasopharyngeal Swab  Result Value Ref Range Status   SARS Coronavirus 2 NEGATIVE NEGATIVE Final    Comment: (NOTE) SARS-CoV-2 target nucleic acids are NOT DETECTED.  The SARS-CoV-2 RNA is generally detectable in upper and lower respiratory specimens during the acute phase of infection. Negative results do not preclude SARS-CoV-2 infection, do not rule out co-infections with other pathogens, and should not be used as the sole basis for treatment or other patient management decisions. Negative results must be combined with clinical observations, patient history, and epidemiological information. The expected result is Negative.  Fact Sheet for Patients: HairSlick.no  Fact Sheet for Healthcare Providers: quierodirigir.com  This test is not yet approved or cleared by the [place] FDA and  has been authorized for detection and/or diagnosis of SARS-CoV-2 by FDA under an Emergency Use Authorization (EUA). This EUA will remain  in effect (meaning this test can be used) for the duration of the COVID-19 declaration under Se ction 564(b)(1) of the Act, 21 U.S.C. section 360bbb-3(b)(1), unless the authorization is terminated or revoked sooner.  Performed at [facility], [address] [place], [place] [address]   Blood culture (routine x 2)     Status: None (Preliminary result)   Collection Time: 02/18/20  3:45 PM   Specimen: Right Antecubital; Blood  Result Value Ref Range Status   Specimen Description   Final    RIGHT ANTECUBITAL BLOOD Performed at [facility]  Lab, [address], [place], [place] [address]    Special Requests   Final    BOTTLES DRAWN AEROBIC AND ANAEROBIC Blood Culture adequate volume Performed at [facility], [address], [place], [place] [address]    Culture   Final    NO GROWTH 2 DAYS Performed at [facility], [address], [place], [place] [address]    Report Status PENDING  Incomplete  Blood culture (routine x 2)     Status: None (Preliminary result)   Collection Time: 02/18/20  3:45 PM   Specimen: Left Antecubital; Blood  Result Value Ref Range Status   Specimen Description   Final    LEFT ANTECUBITAL BLOOD Performed at [facility], [address], [place], [place] [address]    Special Requests   Final    BOTTLES DRAWN AEROBIC AND ANAEROBIC Blood Culture adequate volume Performed at [facility], [address] [place], [place] [address]    Culture   Final    NO GROWTH 2 DAYS Performed at [facility], [address], [place], [place] [address]    Report Status  PENDING  Incomplete    Renal Function: Recent Labs    02/18/20 1545  CREATININE 0.91   Estimated Creatinine Clearance: 133.5 mL/min (by C-G formula based on SCr of 0.91 mg/dL).  Radiologic Imaging: CLINICAL DATA:  Patient status post left ureteral stent placement yesterday. Onset chills and vomiting at 1 a.m. last night.  EXAM: CT ABDOMEN AND PELVIS WITHOUT CONTRAST  TECHNIQUE: Multidetector CT imaging of the abdomen and pelvis was performed following the standard protocol without IV contrast.  COMPARISON:  CT abdomen and pelvis 01/17/2020.  FINDINGS: Lower chest: Lung bases clear.  No pleural or pericardial effusion.  Hepatobiliary: No focal liver abnormality is seen. Status post cholecystectomy. No biliary dilatation.  Pancreas: Unremarkable. No pancreatic ductal dilatation or surrounding inflammatory changes.  Spleen: Normal in size without focal  abnormality.  Adrenals/Urinary Tract: Since the prior examination, a left double-J J ureteral stent has been placed. The stent projects in good position. There is no hydronephrosis and no ureteral or urinary bladder stones are identified. Small stones in the lower pole of the left kidney measure up to 0.5 cm.  The patient has a hyperattenuating subcapsular hematoma along the lateral periphery of the left kidney. The hematoma extends from almost the entire length of the kidney and measures approximately 7.1 cm AP by 1.1 cm transverse. No intraparenchymal hematoma in the left kidney is identified.  The right kidney and ureter are unremarkable.  The adrenal glands appear normal.  Stomach/Bowel: Stomach is within normal limits. The appendix is not visualized and may have been removed. No evidence of appendicitis is present. No evidence of bowel wall thickening, distention, or inflammatory changes. The patient is status post bariatric surgery.  Vascular/Lymphatic: Aortic atherosclerosis. No enlarged abdominal or pelvic lymph nodes.  Reproductive: Prostate is unremarkable.  Other: Small fat containing umbilical hernia.  Musculoskeletal: No acute or focal abnormality. Lower thoracic degenerative change is noted.  IMPRESSION: The examination is positive for an acute subcapsular hematoma of the left kidney as described.  Left double-J ureteral stent is in good position. Negative for hydronephrosis or ureteral stone. A cluster of small, nonobstructing stones is seen in the lower pole of the left kidney.  Fat containing umbilical hernia.  Aortic Atherosclerosis (ICD10-I70.0).   Electronically Signed   By: Drusilla Kanner M.D.   On: 02/18/2020 16:53  Assessment:  44 year old male s/p left ESWL and subsequent left JJ stent placement now with a small left subcapsular hematoma, left renal colic and nausea/vomiting  Plan:  -IVF resuscitation -IV pain pain  medication and nausea medication -Urine culture pending.  Start empiric abx -NPO after midnight for possible ureteroscopy with Dr. Arita Miss tomorrow.   Rhoderick Moody, MD 02/20/2020, 9:45 PM  [facility] Pager: [phone]

## 2020-02-21 ENCOUNTER — Observation Stay (HOSPITAL_COMMUNITY): Payer: 59 | Admitting: Anesthesiology

## 2020-02-21 ENCOUNTER — Encounter (HOSPITAL_COMMUNITY): Payer: Self-pay | Admitting: Urology

## 2020-02-21 ENCOUNTER — Other Ambulatory Visit: Payer: Self-pay

## 2020-02-21 ENCOUNTER — Observation Stay (HOSPITAL_COMMUNITY): Payer: 59

## 2020-02-21 ENCOUNTER — Encounter (HOSPITAL_COMMUNITY)
Admission: AD | Disposition: A | Discharge status: Home / Self Care | Payer: Self-pay | Source: Ambulatory Visit | Attending: Urology

## 2020-02-21 HISTORY — PX: CYSTOSCOPY WITH RETROGRADE PYELOGRAM, URETEROSCOPY AND STENT PLACEMENT: SHX5789

## 2020-02-21 LAB — SARS CORONAVIRUS 2 BY RT PCR (HOSPITAL ORDER, PERFORMED IN ~~LOC~~ HOSPITAL LAB): SARS Coronavirus 2: NEGATIVE

## 2020-02-21 LAB — GLUCOSE, CAPILLARY: Glucose-Capillary: 114 mg/dL — ABNORMAL HIGH (ref 70–99)

## 2020-02-21 LAB — HIV ANTIBODY (ROUTINE TESTING W REFLEX): HIV Screen 4th Generation wRfx: NONREACTIVE

## 2020-02-21 SURGERY — CYSTOURETEROSCOPY, WITH RETROGRADE PYELOGRAM AND STENT INSERTION
Anesthesia: General | Laterality: Left

## 2020-02-21 MED ORDER — PROPOFOL 10 MG/ML IV BOLUS
INTRAVENOUS | Status: AC
  Filled 2020-02-21: qty 20

## 2020-02-21 MED ORDER — HYDROMORPHONE HCL 1 MG/ML IJ SOLN
INTRAMUSCULAR | Status: AC
  Administered 2020-02-21: 0.5 mg via INTRAVENOUS
  Filled 2020-02-21: qty 1

## 2020-02-21 MED ORDER — PROPOFOL 10 MG/ML IV BOLUS
INTRAVENOUS | Status: DC | PRN
  Administered 2020-02-21: 160 mg via INTRAVENOUS

## 2020-02-21 MED ORDER — LIDOCAINE 2% (20 MG/ML) 5 ML SYRINGE
INTRAMUSCULAR | Status: DC | PRN
  Administered 2020-02-21: 100 mg via INTRAVENOUS

## 2020-02-21 MED ORDER — MIDAZOLAM HCL 5 MG/5ML IJ SOLN
INTRAMUSCULAR | Status: DC | PRN
  Administered 2020-02-21: 2 mg via INTRAVENOUS

## 2020-02-21 MED ORDER — SODIUM CHLORIDE 0.9 % IR SOLN
Status: DC | PRN
  Administered 2020-02-21: 3000 mL via INTRAVESICAL

## 2020-02-21 MED ORDER — OXYCODONE HCL 5 MG PO TABS: 5.0000 mg | ORAL_TABLET | Freq: Once | ORAL | Status: DC | PRN

## 2020-02-21 MED ORDER — POTASSIUM CHLORIDE CRYS ER 20 MEQ PO TBCR
20.0000 meq | EXTENDED_RELEASE_TABLET | Freq: Once | ORAL | Status: AC
  Administered 2020-02-21: 20 meq via ORAL
  Filled 2020-02-21: qty 1

## 2020-02-21 MED ORDER — ONDANSETRON HCL 4 MG/2ML IJ SOLN
INTRAMUSCULAR | Status: DC | PRN
  Administered 2020-02-21: 4 mg via INTRAVENOUS

## 2020-02-21 MED ORDER — MIDAZOLAM HCL 2 MG/2ML IJ SOLN
INTRAMUSCULAR | Status: AC
  Filled 2020-02-21: qty 2

## 2020-02-21 MED ORDER — HYDROMORPHONE HCL 1 MG/ML IJ SOLN
INTRAMUSCULAR | Status: DC | PRN
  Administered 2020-02-21 (×2): 1 mg via INTRAVENOUS

## 2020-02-21 MED ORDER — FENTANYL CITRATE (PF) 250 MCG/5ML IJ SOLN
INTRAMUSCULAR | Status: AC
  Filled 2020-02-21: qty 5

## 2020-02-21 MED ORDER — DEXAMETHASONE SODIUM PHOSPHATE 10 MG/ML IJ SOLN
INTRAMUSCULAR | Status: DC | PRN
  Administered 2020-02-21: 10 mg via INTRAVENOUS

## 2020-02-21 MED ORDER — HYDROMORPHONE HCL 1 MG/ML IJ SOLN
0.2500 mg | INTRAMUSCULAR | Status: DC | PRN
  Administered 2020-02-21 (×2): 0.5 mg via INTRAVENOUS

## 2020-02-21 MED ORDER — OXYCODONE HCL 5 MG/5ML PO SOLN: 5.0000 mg | Freq: Once | ORAL | Status: DC | PRN

## 2020-02-21 MED ORDER — FENTANYL CITRATE (PF) 250 MCG/5ML IJ SOLN
INTRAMUSCULAR | Status: DC | PRN
Start: 2020-02-21 — End: 2020-02-21
  Administered 2020-02-21: 50 ug via INTRAVENOUS
  Administered 2020-02-21: 100 ug via INTRAVENOUS
  Administered 2020-02-21 (×2): 50 ug via INTRAVENOUS

## 2020-02-21 MED ORDER — FENTANYL CITRATE (PF) 100 MCG/2ML IJ SOLN
INTRAMUSCULAR | Status: AC
Start: 2020-02-21 — End: 2020-02-22
  Filled 2020-02-21: qty 2

## 2020-02-21 MED ORDER — ALBUTEROL SULFATE HFA 108 (90 BASE) MCG/ACT IN AERS
INHALATION_SPRAY | RESPIRATORY_TRACT | Status: DC | PRN
  Administered 2020-02-21 (×2): 5 via RESPIRATORY_TRACT

## 2020-02-21 MED ORDER — CIPROFLOXACIN IN D5W 400 MG/200ML IV SOLN
INTRAVENOUS | Status: AC
  Filled 2020-02-21: qty 200

## 2020-02-21 MED ORDER — FENTANYL CITRATE (PF) 100 MCG/2ML IJ SOLN
INTRAMUSCULAR | Status: AC
  Administered 2020-02-21: 50 ug via INTRAVENOUS
  Filled 2020-02-21: qty 4

## 2020-02-21 MED ORDER — LACTATED RINGERS IV SOLN: INTRAVENOUS | Status: DC | PRN

## 2020-02-21 MED ORDER — PROPOFOL 500 MG/50ML IV EMUL
INTRAVENOUS | Status: DC | PRN
  Administered 2020-02-21: 75 ug/kg/min via INTRAVENOUS

## 2020-02-21 MED ORDER — HYDROMORPHONE HCL 2 MG/ML IJ SOLN
INTRAMUSCULAR | Status: AC
  Filled 2020-02-21: qty 1

## 2020-02-21 MED ORDER — OXYCODONE HCL 5 MG PO TABS
ORAL_TABLET | ORAL | Status: AC
  Filled 2020-02-21: qty 1

## 2020-02-21 MED ORDER — FENTANYL CITRATE (PF) 100 MCG/2ML IJ SOLN
50.0000 ug | Freq: Once | INTRAMUSCULAR | Status: AC
  Administered 2020-02-21: 100 ug via INTRAVENOUS

## 2020-02-21 MED ORDER — CIPROFLOXACIN IN D5W 400 MG/200ML IV SOLN
400.0000 mg | Freq: Once | INTRAVENOUS | Status: AC
  Administered 2020-02-21: 400 mg via INTRAVENOUS

## 2020-02-21 MED ORDER — DEXAMETHASONE SODIUM PHOSPHATE 10 MG/ML IJ SOLN
INTRAMUSCULAR | Status: AC
  Filled 2020-02-21: qty 1

## 2020-02-21 MED ORDER — FENTANYL CITRATE (PF) 100 MCG/2ML IJ SOLN
25.0000 ug | INTRAMUSCULAR | Status: DC | PRN
  Administered 2020-02-21 (×2): 50 ug via INTRAVENOUS

## 2020-02-21 MED ORDER — ONDANSETRON HCL 4 MG/2ML IJ SOLN
INTRAMUSCULAR | Status: AC
  Filled 2020-02-21: qty 2

## 2020-02-21 MED ORDER — SUCCINYLCHOLINE CHLORIDE 200 MG/10ML IV SOSY
PREFILLED_SYRINGE | INTRAVENOUS | Status: DC | PRN
  Administered 2020-02-21: 160 mg via INTRAVENOUS

## 2020-02-21 SURGICAL SUPPLY — 18 items
BAG URO CATCHER STRL LF (MISCELLANEOUS) ×2 IMPLANT
BASKET ZERO TIP NITINOL 2.4FR (BASKET) ×2 IMPLANT
CATH URET 5FR 28IN OPEN ENDED (CATHETERS) ×2 IMPLANT
CLOTH BEACON ORANGE TIMEOUT ST (SAFETY) ×2 IMPLANT
DRSG TEGADERM 2-3/8X2-3/4 SM (GAUZE/BANDAGES/DRESSINGS) ×2 IMPLANT
EXTRACTOR STONE 1.7FRX115CM (UROLOGICAL SUPPLIES) IMPLANT
FIBER LASER TRACTIP 200 (UROLOGICAL SUPPLIES) ×2 IMPLANT
GLOVE BIO SURGEON STRL SZ 6.5 (GLOVE) ×2 IMPLANT
GOWN STRL REUS W/TWL LRG LVL3 (GOWN DISPOSABLE) ×2 IMPLANT
GUIDEWIRE STR DUAL SENSOR (WIRE) ×4 IMPLANT
KIT TURNOVER KIT A (KITS) IMPLANT
MANIFOLD NEPTUNE II (INSTRUMENTS) ×2 IMPLANT
PACK CYSTO (CUSTOM PROCEDURE TRAY) ×2 IMPLANT
SHEATH URETERAL 12FRX28CM (UROLOGICAL SUPPLIES) ×2 IMPLANT
SHEATH URETERAL 12FRX35CM (MISCELLANEOUS) ×2 IMPLANT
STENT PERCUFLEX 4.8FRX26 (STENTS) ×2 IMPLANT
TUBING CONNECTING 10 (TUBING) ×2 IMPLANT
TUBING UROLOGY SET (TUBING) ×2 IMPLANT

## 2020-02-21 NOTE — Progress Notes (Signed)
Urology Inpatient Progress Report  Renal colic [N23]    Intv/Subj: Patient admitted for renal colic last night.  No overnight events.   Active Problems:   Renal colic  Current Facility-Administered Medications  Medication Dose Route Frequency Provider Last Rate Last Admin  . acetaminophen (TYLENOL) tablet 1,000 mg  1,000 mg Oral Q6H Margette Fast, MD   1,000 mg at 02/21/20 0358  . cephALEXin (KEFLEX) capsule 500 mg  500 mg Oral TID Margette Fast, MD      . dextrose 5 %-0.45 % sodium chloride infusion   Intravenous Continuous Margette Fast, MD 100 mL/hr at 02/21/20 0600 Rate Verify at 02/21/20 0600  . docusate sodium (COLACE) capsule 100 mg  100 mg Oral BID Margette Fast, MD      . morphine 2 MG/ML injection 2-4 mg  2-4 mg Intravenous Q2H PRN Margette Fast, MD   4 mg at 02/21/20 0600  . ondansetron (ZOFRAN) injection 4 mg  4 mg Intravenous Q4H PRN Margette Fast, MD   4 mg at 02/21/20 9449  . opium-belladonna (B&O) suppository 16.2-60mg   1 suppository Rectal Q6H PRN Margette Fast, MD      . oxybutynin (DITROPAN) tablet 5 mg  5 mg Oral Q8H PRN Margette Fast, MD      . oxyCODONE (Oxy IR/ROXICODONE) immediate release tablet 5 mg  5 mg Oral Q4H PRN Margette Fast, MD      . pantoprazole (PROTONIX) EC tablet 40 mg  40 mg Oral Daily Margette Fast, MD      . Gwyndolyn [facility]) tablet 8.6 mg  1 tablet Oral BID Margette Fast, MD      . sertraline (ZOLOFT) tablet 50 mg  50 mg Oral Daily Margette Fast, MD      . tamsulosin [facility]) capsule 0.4 mg  0.4 mg Oral Daily Margette Fast, MD      . traZODone (DESYREL) tablet 100 mg  100 mg Oral Selinda Eon, MD         Objective: Vital: Vitals:   02/20/20 2200 02/21/20 0000 02/21/20 0200 02/21/20 0600  BP:  (!) 140/93 (!) 123/93 (!) 149/96  Pulse:  72 76 64  Resp:  18 16 16   Temp:  98.5 F (36.9 C) 98.5 F (36.9 C) 98.9 F (37.2 C)  TempSrc:  Oral Oral Oral  SpO2:  97% 99% 98%  Weight:  112.3 kg     Height: 5\' 10"  (1.778 m)      I/Os: I/O last 3 completed shifts: In: 799.9 [I.V.:799.9] Out: 600 [Urine:500; Emesis/NG output:100]  Physical Exam:  General: Patient is in no apparent distress Lungs: Normal respiratory effort, chest expands symmetrically. Ext: lower extremities symmetric  Lab Results: Recent Labs    02/18/20 1545 02/20/20 2302  WBC 12.1* 6.4  HGB 14.3 13.8  HCT 40.6 39.9   Recent Labs    02/18/20 1545 02/20/20 2302  NA 139 136  K 3.8 3.3*  CL 103 103  CO2 20* 24  GLUCOSE 134* 187*  BUN 13 15  CREATININE 0.91 0.86  CALCIUM 9.2 8.3*   No results for input(s): LABPT, INR in the last 72 hours. No results for input(s): LABURIN in the last 72 hours. Results for orders placed or performed during the hospital encounter of 02/18/20  Blood culture (routine x 2)     Status: None (Preliminary result)   Collection Time: 02/18/20  3:45 PM   Specimen: Right Antecubital; Blood  Result Value Ref Range Status   Specimen Description   Final  RIGHT ANTECUBITAL BLOOD Performed at [facility], [address], [place], [place] [address]    Special Requests   Final    BOTTLES DRAWN AEROBIC AND ANAEROBIC Blood Culture adequate volume Performed at [facility], [address] [place], [place] [address]    Culture   Final    NO GROWTH 2 DAYS Performed at [facility], [address], [place], [place] [address]    Report Status PENDING  Incomplete  Blood culture (routine x 2)     Status: None (Preliminary result)   Collection Time: 02/18/20  3:45 PM   Specimen: Left Antecubital; Blood  Result Value Ref Range Status   Specimen Description   Final    LEFT ANTECUBITAL BLOOD Performed at [facility], [address] [place], [place] [address]    Special Requests   Final    BOTTLES DRAWN AEROBIC AND ANAEROBIC Blood Culture adequate volume Performed at [facility], [address]  [place], [place] [address]    Culture   Final    NO GROWTH 2 DAYS Performed at [facility], [address] [place], [place] [address]    Report Status PENDING  Incomplete     Assessment: 44 yo man with left renal calculus s/p LEFT ESWL who developed postop renal colic and underwent stent placement with persistent pain/nausea.    Plan: -discussed management options including removal of stent vs attempted ureteroscopy/ll/stent exchange -detailed that he will need to have stent after and there is possibility that I may not be able to reach kidney kidney difficulty putting stent in place -risks and benefits include but not limited to bleeding, infection, pain, damage to surrounding structures, inability to remove stones, need for further treatment -NPO until procedure this afternoon    Kasandra Knudsen, MD Urology 02/21/2020, 7:17 AM

## 2020-02-21 NOTE — Anesthesia Preprocedure Evaluation (Signed)
Anesthesia Evaluation  Patient identified by MRN, date of birth, ID band Patient awake    Reviewed: Allergy & Precautions, NPO status , Patient's Chart, lab work & pertinent test results  Airway Mallampati: II  TM Distance: >3 FB Neck ROM: Full    Dental no notable dental hx.    Pulmonary neg pulmonary ROS, former smoker,    Pulmonary exam normal breath sounds clear to auscultation       Cardiovascular negative cardio ROS Normal cardiovascular exam Rhythm:Regular Rate:Normal     Neuro/Psych negative neurological ROS  negative psych ROS   GI/Hepatic negative GI ROS, (+)     substance abuse  cocaine use,   Endo/Other  diabetes  Renal/GU negative Renal ROS  negative genitourinary   Musculoskeletal negative musculoskeletal ROS (+) narcotic dependent  Abdominal   Peds negative pediatric ROS (+)  Hematology negative hematology ROS (+)   Anesthesia Other Findings   Reproductive/Obstetrics negative OB ROS                             Anesthesia Physical Anesthesia Plan  ASA: III  Anesthesia Plan: General   Post-op Pain Management:    Induction: Intravenous  PONV Risk Score and Plan: 2 and Ondansetron, Dexamethasone and Treatment may vary due to age or medical condition  Airway Management Planned: LMA  Additional Equipment:   Intra-op Plan:   Post-operative Plan: Extubation in OR  Informed Consent: I have reviewed the patients History and Physical, chart, labs and discussed the procedure including the risks, benefits and alternatives for the proposed anesthesia with the patient or authorized representative who has indicated his/her understanding and acceptance.     Dental advisory given  Plan Discussed with: CRNA and Surgeon  Anesthesia Plan Comments:         Anesthesia Quick Evaluation

## 2020-02-21 NOTE — Anesthesia Postprocedure Evaluation (Signed)
Anesthesia Post Note  Patient: Albert White  Procedure(s) Performed: CYSTOSCOPY WITH LEFT RETROGRADE PYELOGRAM, URETEROSCOPY LASER  AND STENT PLACEMENT (Left )     Patient location during evaluation: PACU Anesthesia Type: General Level of consciousness: awake and alert Pain management: pain level controlled Vital Signs Assessment: post-procedure vital signs reviewed and stable Respiratory status: spontaneous breathing, nonlabored ventilation, respiratory function stable and patient connected to nasal cannula oxygen Cardiovascular status: blood pressure returned to baseline and stable Postop Assessment: no apparent nausea or vomiting Anesthetic complications: no   No complications documented.  Last Vitals:  Vitals:   02/21/20 1738 02/21/20 1745  BP: (!) 131/91 130/87  Pulse: 67 67  Resp: 12 13  Temp: 37.1 C   SpO2: 100% 100%    Last Pain:  Vitals:   02/21/20 1745  TempSrc:   PainSc: 10-Worst pain ever                 Irys Nigh S

## 2020-02-21 NOTE — Transfer of Care (Signed)
Immediate Anesthesia Transfer of Care Note  Patient: TOMI PADDOCK  Procedure(s) Performed: CYSTOSCOPY WITH LEFT RETROGRADE PYELOGRAM, URETEROSCOPY LASER  AND STENT PLACEMENT (Left )  Patient Location: PACU  Anesthesia Type:General  Level of Consciousness: awake, alert , oriented and patient cooperative  Airway & Oxygen Therapy: Patient Spontanous Breathing and Patient connected to face mask oxygen  Post-op Assessment: Report given to RN, Post -op Vital signs reviewed and stable and Patient moving all extremities X 4  Post vital signs: stable  Last Vitals:  Vitals Value Taken Time  BP 131/91 02/21/20 1738  Temp    Pulse 42 02/21/20 1745  Resp 13 02/21/20 1745  SpO2 79 % 02/21/20 1745  Vitals shown include unvalidated device data.  Last Pain:  Vitals:   02/21/20 1614  TempSrc:   PainSc: 8       Patients Stated Pain Goal: 2 (02/21/20 1517)  Complications: No complications documented.

## 2020-02-21 NOTE — Op Note (Signed)
Preoperative diagnosis: left ureteral calculus  Postoperative diagnosis: left ureteral calculus  Procedure:  1. Cystoscopy 2. Left ureteroscopy, laser lithotripsy, basket stone extraction 3. left 4.8cmF x 26 ureteral stent placement   Surgeon: Kasandra Knudsen, MD  Anesthesia: General  Complications: None  Intraoperative findings: 1 normal urethra 2.  Cystoscopy within normal limits with bilateral orthotopic ureteral orifices 3.  Existing left ureteral stent seen emanating from ureteral orifice 4.  Cluster of left ureteral calculi in mid to distal ureter with one larger stone impacted 5. 4.8Fr x 26cm JJ stent with string  EBL: Minimal  Specimens: 1. left ureteral calculus  Disposition of specimens: [facility] for stone analysis  Indication: Albert White is a 44 y.o.   patient with a 41mm left ureteral stone who underwent ESWL and had severe renal colic on PPD1.  He then underwent left ureteral stent placement and has had intractable pain and nausea requiring hospital admission.  He returns to OR today for possible ureteroscopy, laser lithotripsy and stent exchange.   After reviewing the management options for treatment, the patient elected to proceed with the above surgical procedure(s). We have discussed the potential benefits and risks of the procedure, side effects of the proposed treatment, the likelihood of the patient achieving the goals of the procedure, and any potential problems that might occur during the procedure or recuperation. Informed consent has been obtained.   Description of procedure:  The patient was taken to the operating room and general anesthesia was induced.  The patient was placed in the dorsal lithotomy position, prepped and draped in the usual sterile fashion, and preoperative antibiotics were administered. A preoperative time-out was performed.   Cystourethroscopy was performed.  The patient's urethra was examined and was normal.  The bladder was then systematically examined in its entirety. There was no evidence for any bladder tumors, stones, or other mucosal pathology.    Attention then turned to the left ureteral orifice and graspers were used to grab the existing left ureteral stent and bring it to the urethral meatus.   A 0.38 sensor guidewire was then advanced up the left ureter through the ureteral stent and into renal pelvis under fluoroscopic guidance.  A second wire was placed through the cystoscope into the left ureteral orifice and advanced to the kidney with fluoroscopic guidance.  A short ureteral access sheath was then placed over the second wire with fluoroscopic guidance to the mid ureter.  Flexible ureteroscopy then took place at which time a stack of calculi were seen in the mid ureter.  Due to the location and impacted stone decision was made to remove the flexible ureteroscope with the access sheath in favor of the semirigid ureteroscope.  The 4.5 [place] single channel semirigid ureteroscope was then placed in the urethral meatus and advanced into the bladder and guided into the left ureter until the stones were encountered. The stone was then fragmented with the 242 micron holmium laser fiber.   There was 1 stone that was seen impacted in the 6:00 location of the distal ureter and appeared to be submucosal.  The stone fragment was left alone.  All stone pieces were fragmented to less than 1 mm or removed.  The scope was navigated to the UPJ where no other calculi were seen.  Reinspection of the ureter revealed no remaining visible stones or fragments.   The wire was then backloaded through the cystoscope and a ureteral stent was advance over the wire using Seldinger technique.  The  stent was positioned appropriately under fluoroscopic and cystoscopic guidance.  The wire was then removed with an adequate stent curl noted in the renal pelvis as well as in the bladder.  The bladder was then emptied and the procedure  ended.  The patient appeared to tolerate the procedure well and without complications.  The patient was able to be awakened and transferred to the recovery unit in satisfactory condition.   Disposition: The tether of the stent was left on and secured to the ventral aspect of the patient's penis.  Given the degree of impaction the patient will need to keep the ureteral stent for approximately 7 days.  He will be readmitted to the hospital for observation tonight and reevaluate in the morning.  Instructions for removing the stent have been provided to the patient. The patient has been scheduled for followup in 6 weeks with a renal ultrasound.

## 2020-02-21 NOTE — Progress Notes (Signed)
RN received pt from PACU. VSS. Pt tolerating orally. No further needs at this time. RN will continue to monitor.

## 2020-02-21 NOTE — Progress Notes (Signed)
Pt taken to OR by OR tech's.

## 2020-02-21 NOTE — Anesthesia Procedure Notes (Signed)
Procedure Name: Intubation Date/Time: 02/21/2020 4:31 PM Performed by: Lissa Morales, CRNA Pre-anesthesia Checklist: Patient identified, Emergency Drugs available, Suction available and Patient being monitored Patient Re-evaluated:Patient Re-evaluated prior to induction Oxygen Delivery Method: Circle system utilized Preoxygenation: Pre-oxygenation with 100% oxygen Induction Type: IV induction, Rapid sequence and Cricoid Pressure applied Ventilation: Mask ventilation without difficulty Laryngoscope Size: Mac and 4 Tube type: Oral Tube size: 8.0 mm Number of attempts: 1 Airway Equipment and Method: Stylet and Oral airway Placement Confirmation: ETT inserted through vocal cords under direct vision,  positive ETCO2 and breath sounds checked- equal and bilateral Secured at: 21 cm Tube secured with: Tape Dental Injury: Teeth and Oropharynx as per pre-operative assessment

## 2020-02-21 NOTE — Interval H&P Note (Signed)
History and Physical Interval Note:  02/21/2020 9:35 AM  Albert White  has presented today for surgery, with the diagnosis of right renal colic.  The various methods of treatment have been discussed with the patient and family. After consideration of risks, benefits and other options for treatment, the patient has consented to  Procedure(s): CYSTOSCOPY WITH LEFT RETROGRADE PYELOGRAM, URETEROSCOPY LASER  AND STENT PLACEMENT (Left) as a surgical intervention.  The patient's history has been reviewed, patient examined, no change in status, stable for surgery.  I have reviewed the patient's chart and labs.  Questions were answered to the patient's satisfaction.     Albert White D Sholom Dulude

## 2020-02-22 ENCOUNTER — Encounter (HOSPITAL_COMMUNITY): Payer: Self-pay | Admitting: Urology

## 2020-02-22 DIAGNOSIS — E785 Hyperlipidemia, unspecified: Secondary | ICD-10-CM | POA: Diagnosis present

## 2020-02-22 DIAGNOSIS — Z87891 Personal history of nicotine dependence: Secondary | ICD-10-CM | POA: Diagnosis not present

## 2020-02-22 DIAGNOSIS — R109 Unspecified abdominal pain: Secondary | ICD-10-CM | POA: Diagnosis present

## 2020-02-22 DIAGNOSIS — Z886 Allergy status to analgesic agent status: Secondary | ICD-10-CM | POA: Diagnosis not present

## 2020-02-22 DIAGNOSIS — Z20822 Contact with and (suspected) exposure to covid-19: Secondary | ICD-10-CM | POA: Diagnosis present

## 2020-02-22 DIAGNOSIS — Z9884 Bariatric surgery status: Secondary | ICD-10-CM | POA: Diagnosis not present

## 2020-02-22 DIAGNOSIS — N202 Calculus of kidney with calculus of ureter: Secondary | ICD-10-CM | POA: Diagnosis present

## 2020-02-22 DIAGNOSIS — Z9049 Acquired absence of other specified parts of digestive tract: Secondary | ICD-10-CM | POA: Diagnosis not present

## 2020-02-22 DIAGNOSIS — E119 Type 2 diabetes mellitus without complications: Secondary | ICD-10-CM | POA: Diagnosis present

## 2020-02-22 DIAGNOSIS — Z87442 Personal history of urinary calculi: Secondary | ICD-10-CM | POA: Diagnosis not present

## 2020-02-22 DIAGNOSIS — L732 Hidradenitis suppurativa: Secondary | ICD-10-CM | POA: Diagnosis present

## 2020-02-22 MED ORDER — OXYBUTYNIN CHLORIDE 5 MG PO TABS: 5.0000 mg | ORAL_TABLET | Freq: Three times a day (TID) | ORAL | 0 refills | Status: DC | PRN

## 2020-02-22 MED ORDER — PHENAZOPYRIDINE HCL 200 MG PO TABS: 200.0000 mg | ORAL_TABLET | Freq: Three times a day (TID) | ORAL | 0 refills | Status: DC | PRN

## 2020-02-22 NOTE — Progress Notes (Signed)
Urology Inpatient Progress Report  Renal colic [N23]  Procedure(s): CYSTOSCOPY WITH LEFT RETROGRADE PYELOGRAM, URETEROSCOPY LASER  AND STENT PLACEMENT  1 Day Post-Op   Intv/Subj: Patient underwent ureteroscopy with laser lithotripsy and stent exchange yesterday evening.   He is feeling much better his AM.    Active Problems:   Renal colic  Current Facility-Administered Medications  Medication Dose Route Frequency Provider Last Rate Last Admin  . acetaminophen (TYLENOL) tablet 1,000 mg  1,000 mg Oral Q6H Margette Fast, MD   1,000 mg at 02/22/20 0354  . cephALEXin (KEFLEX) capsule 500 mg  500 mg Oral TID Margette Fast, MD   500 mg at 02/21/20 2009  . dextrose 5 %-0.45 % sodium chloride infusion   Intravenous Continuous Margette Fast, MD 100 mL/hr at 02/21/20 1516 Rate Verify at 02/21/20 1516  . docusate sodium (COLACE) capsule 100 mg  100 mg Oral BID Margette Fast, MD   100 mg at 02/21/20 2009  . morphine 2 MG/ML injection 2-4 mg  2-4 mg Intravenous Q2H PRN Margette Fast, MD   4 mg at 02/21/20 1250  . ondansetron (ZOFRAN) injection 4 mg  4 mg Intravenous Q4H PRN Margette Fast, MD   4 mg at 02/21/20 1035  . opium-belladonna (B&O) suppository 16.2-60mg   1 suppository Rectal Q6H PRN Margette Fast, MD      . oxybutynin [facility]) tablet 5 mg  5 mg Oral Q8H PRN Margette Fast, MD      . oxyCODONE (Oxy IR/ROXICODONE) immediate release tablet 5 mg  5 mg Oral Q4H PRN Margette Fast, MD   5 mg at 02/22/20 0354  . pantoprazole (PROTONIX) EC tablet 40 mg  40 mg Oral Daily Margette Fast, MD      . senna [facility]) tablet 8.6 mg  1 tablet Oral BID Margette Fast, MD   8.6 mg at 02/21/20 2009  . sertraline (ZOLOFT) tablet 50 mg  50 mg Oral Daily Margette Fast, MD      . tamsulosin [facility]) capsule 0.4 mg  0.4 mg Oral Daily Margette Fast, MD      . traZODone (DESYREL) tablet 100 mg  100 mg Oral QHS Margette Fast, MD   100 mg at 02/21/20 2009      Objective: Vital: Vitals:   02/21/20 1845 02/21/20 1902 02/21/20 2039 02/22/20 0430  BP: (!) 111/91 (!) 143/91 129/81 132/86  Pulse: 67 67 72 67  Resp: 15 17 18 18   Temp: 98.3 F (36.8 C) 98.1 F (36.7 C) 98.2 F (36.8 C) 99.1 F (37.3 C)  TempSrc:  Oral Oral Oral  SpO2: 100% 99% 98% 96%  Weight:      Height:       I/Os: I/O last 3 completed shifts: In: 2697 [I.V.:2497; IV Piggyback:200] Out: 1000 [Urine:900; Emesis/NG output:100]  Physical Exam:  General: Patient is in no apparent distress Lungs: Normal respiratory effort, chest expands symmetrically. GI:  No rigidity Ext: lower extremities symmetric  Lab Results: Recent Labs    02/20/20 2302  WBC 6.4  HGB 13.8  HCT 39.9   Recent Labs    02/20/20 2302  NA 136  K 3.3*  CL 103  CO2 24  GLUCOSE 187*  BUN 15  CREATININE 0.86  CALCIUM 8.3*   No results for input(s): LABPT, INR in the last 72 hours. No results for input(s): LABURIN in the last 72 hours. Results for orders placed or performed during the hospital encounter of 02/20/20  SARS Coronavirus 2 by RT PCR (hospital order, performed in  [facility] hospital lab) Nasopharyngeal Nasopharyngeal Swab     Status: None   Collection Time: 02/21/20 10:43 AM   Specimen: Nasopharyngeal Swab  Result Value Ref Range Status   SARS Coronavirus 2 NEGATIVE NEGATIVE Final    Comment: (NOTE) SARS-CoV-2 target nucleic acids are NOT DETECTED.  The SARS-CoV-2 RNA is generally detectable in upper and lower respiratory specimens during the acute phase of infection. The lowest concentration of SARS-CoV-2 viral copies this assay can detect is 250 copies / mL. A negative result does not preclude SARS-CoV-2 infection and should not be used as the sole basis for treatment or other patient management decisions.  A negative result may occur with improper specimen collection / handling, submission of specimen other than nasopharyngeal swab, presence of viral mutation(s)  within the areas targeted by this assay, and inadequate number of viral copies (<250 copies / mL). A negative result must be combined with clinical observations, patient history, and epidemiological information.  Fact Sheet for Patients:   BoilerBrush.com.cy  Fact Sheet for Healthcare Providers: https://pope.com/  This test is not yet approved or  cleared by the [place] FDA and has been authorized for detection and/or diagnosis of SARS-CoV-2 by FDA under an Emergency Use Authorization (EUA).  This EUA will remain in effect (meaning this test can be used) for the duration of the COVID-19 declaration under Section 564(b)(1) of the Act, 21 U.S.C. section 360bbb-3(b)(1), unless the authorization is terminated or revoked sooner.  Performed at [facility], [address], [place], [place] [address]     Studies/Results: DG C-Arm 1-60 Min-No Report  Result Date: 02/21/2020 Fluoroscopy was utilized by the requesting physician.  No radiographic interpretation.    Assessment: Assessment: 44 yo man with left renal calculus s/p LEFT ESWL who developed postop renal colic and underwent stent placement with persistent pain/nausea now POD1 s/p CYSTOSCOPY WITH LEFT RETROGRADE PYELOGRAM, URETEROSCOPY LASER  AND STENT PLACEMENT, 1 Day Post-Op  doing well.  Plan: -nausea and pain significantly improved -discussed OR findings and recommendation to keep stent in place for 2 weeks -will d/c today and pt to follow up in 2 weeks with KUB and possible stent removal in the office   Kasandra Knudsen, MD Urology 02/22/2020, 6:51 AM

## 2020-02-22 NOTE — Discharge Summary (Signed)
Date of admission: 02/20/2020  Date of discharge: 02/22/2020  Admission diagnosis: left renal colic  Discharge diagnosis: same  Secondary diagnoses:  Patient Active Problem List   Diagnosis Date Noted  . Renal colic 75/64/3329  . Choledocholithiasis 06/06/2018  . Polysubstance (excluding opioids) dependence, daily use ([place]) 06/24/2017  . MDD (major depressive disorder), recurrent severe, without psychosis ([place]) 06/22/2017  . Major depressive disorder, single episode, severe ([place]) 06/21/2017  . Polysubstance abuse ([place]) 06/21/2017  . Pain management   . Hypomagnesemia   . Acute pulmonary edema (HCC)   . Peritonitis ([place])   . Gastrointestinal anastomotic leak   . Pleural effusion on left   . Postprocedural intraabdominal abscess 01/15/2017  . Increased band cell count 01/06/2017  . Generalized edema 01/06/2017  . Former smoker 08/08/2016  . Morbid obesity with BMI of 50.0-59.9, adult ([place]) 06/25/2016  . OSA (obstructive sleep apnea) 06/25/2016  . Vitamin D deficiency 06/25/2016  . Type 2 diabetes mellitus without complication, without long-term current use of insulin ([place]) 02/29/2016  . Mixed hyperlipidemia 02/29/2016  . Cellulitis and abscess of neck 02/29/2016  . Cellulitis 12/14/2013  . Morbid obesity ([place]) 12/14/2013  . Neck abscess 12/14/2013  . Hidradenitis 08/09/2012    Procedures performed: Procedure(s): CYSTOSCOPY WITH LEFT RETROGRADE PYELOGRAM, URETEROSCOPY LASER  AND STENT PLACEMENT  History and Physical: For full details, please see admission history and physical. Briefly, Albert White is a 44 y.o. year old patient with left renal calculus s/p LEFT ESWL who developed postop renal colic and underwent stent placement with persistent pain/nausea.  He underwent ureteroscopy with laser lithotripsy and stent placement on HD2.     Hospital Course: Patient tolerated the procedure well.  He was then transferred to the floor after an uneventful PACU stay.  His hospital  course was uncomplicated.  On POD#1 he had met discharge criteria: was eating a regular diet, was up and ambulating independently,  pain was well controlled, was voiding without a catheter, and was ready to for discharge.   Laboratory values:  Recent Labs    02/20/20 2302  WBC 6.4  HGB 13.8  HCT 39.9   Recent Labs    02/20/20 2302  NA 136  K 3.3*  CL 103  CO2 24  GLUCOSE 187*  BUN 15  CREATININE 0.86  CALCIUM 8.3*   No results for input(s): LABPT, INR in the last 72 hours. No results for input(s): LABURIN in the last 72 hours. Results for orders placed or performed during the hospital encounter of 02/20/20  SARS Coronavirus 2 by RT PCR (hospital order, performed in [facility] hospital lab) Nasopharyngeal Nasopharyngeal Swab     Status: None   Collection Time: 02/21/20 10:43 AM   Specimen: Nasopharyngeal Swab  Result Value Ref Range Status   SARS Coronavirus 2 NEGATIVE NEGATIVE Final    Comment: (NOTE) SARS-CoV-2 target nucleic acids are NOT DETECTED.  The SARS-CoV-2 RNA is generally detectable in upper and lower respiratory specimens during the acute phase of infection. The lowest concentration of SARS-CoV-2 viral copies this assay can detect is 250 copies / mL. A negative result does not preclude SARS-CoV-2 infection and should not be used as the sole basis for treatment or other patient management decisions.  A negative result may occur with improper specimen collection / handling, submission of specimen other than nasopharyngeal swab, presence of viral mutation(s) within the areas targeted by this assay, and inadequate number of viral copies (<250 copies / mL). A negative result must be  combined with clinical observations, patient history, and epidemiological information.  Fact Sheet for Patients:   StrictlyIdeas.no  Fact Sheet for Healthcare Providers: BankingDealers.co.za  This test is not yet approved or  cleared  by the [place] FDA and has been authorized for detection and/or diagnosis of SARS-CoV-2 by FDA under an Emergency Use Authorization (EUA).  This EUA will remain in effect (meaning this test can be used) for the duration of the COVID-19 declaration under Section 564(b)(1) of the Act, 21 U.S.C. section 360bbb-3(b)(1), unless the authorization is terminated or revoked sooner.  Performed at [facility], [place] [address] [place], [place] [address]     Disposition: Home  Discharge instruction: The patient was instructed to be ambulatory.  Discharge medications:  Allergies as of 02/22/2020      Reactions   Ibuprofen Other (See Comments)   Has bar   Nsaids    affects kidney      Medication List    TAKE these medications   cephALEXin 500 MG capsule Commonly known as: KEFLEX Take 1 capsule (500 mg total) by mouth 3 (three) times daily for 10 days.   ondansetron 4 MG disintegrating tablet Commonly known as: Zofran ODT Take 1 tablet (4 mg total) by mouth every 8 (eight) hours as needed for nausea or vomiting.   oxybutynin 5 MG tablet Commonly known as: DITROPAN Take 1 tablet (5 mg total) by mouth every 8 (eight) hours as needed for bladder spasms.   oxyCODONE 5 MG immediate release tablet Commonly known as: Roxicodone Take 1 tablet (5 mg total) by mouth every 4 (four) hours as needed for severe pain.   oxyCODONE-acetaminophen 5-325 MG tablet Commonly known as: Percocet Take 1 tablet by mouth every 4 (four) hours as needed.   pantoprazole 40 MG tablet Commonly known as: PROTONIX Take 40 mg by mouth daily.   phenazopyridine 200 MG tablet Commonly known as: Pyridium Take 1 tablet (200 mg total) by mouth 3 (three) times daily as needed for pain (burninig with urination).   sertraline 50 MG tablet Commonly known as: ZOLOFT Take 50 mg by mouth daily.   tamsulosin 0.4 MG Caps capsule Commonly known as: FLOMAX Take 0.4 mg by mouth daily.   traZODone 100 MG  tablet Commonly known as: DESYREL Take 100 mg by mouth at bedtime.       Followup:   Follow-up Information    [facility].   Why: Patient to be contacted for appointment in 2 weeks Contact information: [place] [phone]

## 2020-02-22 NOTE — Discharge Instructions (Signed)
DISCHARGE INSTRUCTIONS FOR KIDNEY STONE/URETERAL STENT   MEDICATIONS:  1.  Resume all your other meds from home  2. Pyridium is to help with the burning/stinging when you urinate. 3. Oxycodone is for moderate/severe pain, otherwise taking upto 1000 mg every 6 hours of plainTylenol will help treat your pain.   4. Oxybutynin is to help with bladder cramping/pressure  ACTIVITY:  1. No strenuous activity x 1week  2. No driving while on narcotic pain medications  3. Drink plenty of water  4. Continue to walk at home - you can still get blood clots when you are at home, so keep active, but don't over do it.  5. May return to work/school tomorrow or when you feel ready   BATHING:  1. You can shower and we recommend daily showers  2. You have a string coming from your urethra: The stent string is attached to your ureteral stent. Do not pull on this.   SIGNS/SYMPTOMS TO CALL:  Please call us if you have a fever greater than 101.5, uncontrolled nausea/vomiting, uncontrolled pain, dizziness, unable to urinate, bloody urine, chest pain, shortness of breath, leg swelling, leg pain, redness around wound, drainage from wound, or any other concerns or questions.   You can reach [place] at [phone].   FOLLOW-UP:  1. You have a string attached to your stent and it will be removed in the office in approximately 2 weeks

## 2020-02-22 NOTE — Progress Notes (Signed)
RN reviewed discharge paperwork with patient and wife. All questions addressed. IV removed. Pt discharged in stable condition.

## 2020-02-23 LAB — CULTURE, BLOOD (ROUTINE X 2)
Culture: NO GROWTH
Culture: NO GROWTH
Special Requests: ADEQUATE
Special Requests: ADEQUATE

## 2020-03-01 ENCOUNTER — Encounter: Payer: Self-pay | Admitting: Nurse Practitioner

## 2020-03-01 ENCOUNTER — Ambulatory Visit (INDEPENDENT_AMBULATORY_CARE_PROVIDER_SITE_OTHER): Payer: 59 | Admitting: Nurse Practitioner

## 2020-03-01 VITALS — BP 110/80 | HR 72 | Ht 68.25 in | Wt 252.1 lb

## 2020-03-01 DIAGNOSIS — R933 Abnormal findings on diagnostic imaging of other parts of digestive tract: Secondary | ICD-10-CM

## 2020-03-01 DIAGNOSIS — R1011 Right upper quadrant pain: Secondary | ICD-10-CM | POA: Diagnosis not present

## 2020-03-01 DIAGNOSIS — R112 Nausea with vomiting, unspecified: Secondary | ICD-10-CM

## 2020-03-01 NOTE — Patient Instructions (Signed)
If you are age 43 or older, your body mass index should be between 23-30. Your Body mass index is 38.06 kg/m. If this is out of the aforementioned range listed, please consider follow up with your Primary Care Provider.  If you are age 71 or younger, your body mass index should be between 19-25. Your Body mass index is 38.06 kg/m. If this is out of the aformentioned range listed, please consider follow up with your Primary Care Provider.   You have been scheduled for an endoscopy. Please follow written instructions given to you at your visit today. If you use inhalers (even only as needed), please bring them with you on the day of your procedure.  Take fiber twice a day - mixed with water or juice

## 2020-03-01 NOTE — Progress Notes (Signed)
ASSESSMENT AND PLAN    # Hx of Roux-en-Y / RUQ pain / nausea and vomiting / abnormal CT scan --RUQ pain similar to the pain prior to cholecystectomy last year.  --There does seem to be a musculoskeletal component to the pain but that doesn't explain all the symptoms nor CT scan findings.  --CT scan in late August shows previous gastric bypass surgery, some thickening of small-bowel loops used in the gastric bypass, large amount of stool in colon, stranding or edema in the fat of the right side of the abdomen in the region of the caudal tip of the right lobe of the liver and the ascending colon.  --Patient says he has a normal BM every day but based on CT scan I have asked him to start Benefiber BID --The fat stranding near right colon is of unclear etiology. May need colonoscopy at some point but for now will start with EGD.  If appears the thickened small bowel on CT scan is the biliary limb which won't be accessible by EGD but we can at least look for other etiology of his symptoms.  The risks and benefits of EGD were discussed and the patient agrees to proceed.  --Protonix is helping so will continue.    HISTORY OF PRESENT ILLNESS     Primary Gastroenterologist : new- Marsa Aris, MD  Chief Complaint : RUQ pain , nausea and vomiting  Albert White is a 44 y.o. male with PMH / PSH significant for,  but not necessarily limited to: OSA, Roux - en Y in 2018,  Cholecystectomy, nephrolithiasis, obesity  Patient had a Roux - en Y in 2018, lost 170 pounds. Weight now stable. He is referred by PCP for RUQ pain. He was having RUQ last year, US showed gallbladder sludge. He ended up having a cholecystectomy and felt better for a while but having recurrent RUQ pain for about a month. He feels like the pain is the same as prior to cholecystectomy but not as bad.The pain is burning in nature,  often radiates through to his back. The pain is present nearly everyday and is not affected by  eating . Sometimes physical activity can exacerbate the discomfort but it can also occur at rest. He has had N/V a couple of times a week. He has been taking pain meds for kidney stones but only since last Tuesday.  He started Protonix a month ago and it seems to be helping but still gets the RUQ pain 1-2 times a week. Episodes last 30 min to a couple of hours.  Tylenol doesn't help. He says his bowel movements are normal. Has a Bm everyday though large amount of fecal material in colon seen on CT scan in August. No blood in stool. No FMH of colon cancer.    Last week he had urologic procedures. He had lithotripsy followed later by stent placement on left side. That was his first kidney stone.   Data Reviewed:  CT w/ contast  For abdominal pain IMPRESSION: 1. Previous gastric bypass surgery. There is some thickening of small-bowel loops used in the gastric bypass. This could be due to acid peptic disease and could be a cause of epigastric pain. No sign of obstruction or leak. 2. Stranding or edema in the fat of the right side of the abdomen in the region of the caudal tip of the right lobe of the liver and the ascending colon. I do not see a specific etiology for this.  Differential diagnosis includes chronic scarring related to the previous cholecystitis and cholecystectomy versus some active inflammatory change either due to unappreciated colitis or unappreciated diverticulitis. 3. Large amount of gas and fecal matter in the colon. 4. Nonobstructing left renal calculi. 5. Aortic atherosclerosis.    02/18/20  LFTs normal.  Cr 0.91 WBC 12.1 hgb 14.21 May 2018 [place] Distended gallbladder with sludge   Previous Endoscopic Evaluations / Pertinent Studies: none   Past Medical History:  Diagnosis Date  . Abscess   . Anxiety   . Depression   . Diabetes mellitus without complication (HCC)    no problem since gastric bypass  . GERD (gastroesophageal reflux disease)   . Hidradenitis  suppurativa   . Hyperlipemia   . Hypertension   . Kidney stones   . Morbid obesity (HCC)   . Vertigo      Past Surgical History:  Procedure Laterality Date  . CHOLECYSTECTOMY N/A 06/09/2018   Procedure: LAPAROSCOPIC CHOLECYSTECTOMY WITH   INTRAOPERATIVE CHOLANGIOGRAM,;  Surgeon: Andria Meuse, MD;  Location: [facility] OR;  Service: General;  Laterality: N/A;  . CYSTOSCOPY W/ URETERAL STENT PLACEMENT Left 02/17/2020   Procedure: CYSTOSCOPY WITH RETROGRADE PYELOGRAM/URETERAL STENT PLACEMENT;  Surgeon: Noel Christmas, MD;  Location[facility];  Service: Urology;  Laterality: Left;  . CYSTOSCOPY WITH RETROGRADE PYELOGRAM, URETEROSCOPY AND STENT PLACEMENT Left 02/21/2020   Procedure: CYSTOSCOPY WITH LEFT RETROGRADE PYELOGRAM, URETEROSCOPY LASER  AND STENT PLACEMENT;  Surgeon: Noel Christmas, MD;  Location: [facility];  Service: Urology;  Laterality: Left;  . EXTRACORPOREAL SHOCK WAVE LITHOTRIPSY Left 02/16/2020   Procedure: EXTRACORPOREAL SHOCK WAVE LITHOTRIPSY (ESWL);  Surgeon: Alfredo Martinez, MD;  [facility];  Service: Urology;  Laterality: Left;  Marland Kitchen GASTRIC BYPASS  11/2016  . INCISION AND DRAINAGE ABSCESS Right 12/15/2013   Procedure: INCISION AND DRAINAGE Right Posterior neck ABSCESS;  Surgeon: Cherylynn Ridges, MD;  Location: [facility] OR;  Service: General;  Laterality: Right;  . INCISION AND DRAINAGE DEEP NECK ABSCESS     Hidradenitis - 3-4 times at [facility] and [place]  . IR CATHETER TUBE CHANGE  02/18/2017  . IR RADIOLOGIST EVAL & MGMT  02/11/2017  . IR RADIOLOGIST EVAL & MGMT  03/03/2017  . IR THORACENTESIS ASP PLEURAL SPACE W/IMG GUIDE  01/23/2017  . LAPAROSCOPIC LYSIS OF ADHESIONS N/A 06/09/2018   Procedure: LAPAROSCOPIC LYSIS OF ADHESIONS;  Surgeon: Andria Meuse, MD;  Location: [facility] OR;  Service: General;  Laterality: N/A;   Family History  Problem Relation Age of Onset  . Arthritis Mother   . Hypertension Mother   . Cancer Paternal Grandmother          type unkbown  . Cancer Paternal Aunt        type unkbown  . Cancer Paternal Uncle        type unkbown   Social History   Tobacco Use  . Smoking status: Former Smoker    Packs/day: 1.00    Quit date: 11/27/2013    Years since quitting: 6.2  . Smokeless tobacco: Never Used  Vaping Use  . Vaping Use: Never used  Substance Use Topics  . Alcohol use: No  . Drug use: Yes    Types: Cocaine   Current Outpatient Medications  Medication Sig Dispense Refill  . ondansetron (ZOFRAN ODT) 4 MG disintegrating tablet Take 1 tablet (4 mg total) by mouth every 8 (eight) hours as needed for nausea or vomiting. 8 tablet 0  . oxybutynin (  DITROPAN) 5 MG tablet Take 1 tablet (5 mg total) by mouth every 8 (eight) hours as needed for bladder spasms. 30 tablet 0  . oxyCODONE (ROXICODONE) 5 MG immediate release tablet Take 1 tablet (5 mg total) by mouth every 4 (four) hours as needed for severe pain. 20 tablet 0  . pantoprazole (PROTONIX) 40 MG tablet Take 40 mg by mouth daily.    . phenazopyridine (PYRIDIUM) 200 MG tablet Take 1 tablet (200 mg total) by mouth 3 (three) times daily as needed for pain (burninig with urination). 10 tablet 0  . sertraline (ZOLOFT) 50 MG tablet Take 50 mg by mouth daily.    . tamsulosin (FLOMAX) 0.4 MG CAPS capsule Take 0.4 mg by mouth daily.     . traZODone (DESYREL) 100 MG tablet Take 100 mg by mouth at bedtime.     No current facility-administered medications for this visit.   Allergies  Allergen Reactions  . Ibuprofen Other (See Comments)    Has bar  . Nsaids     affects kidney     Review of Systems: Positive for back pain, headaches..  All other systems reviewed and negative except where noted in HPI.   PHYSICAL EXAM :    Wt Readings from Last 3 Encounters:  03/01/20 252 lb 2 oz (114.4 kg)  02/21/20 247 lb 9.2 oz (112.3 kg)  02/18/20 256 lb (116.1 kg)    BP 110/80 (BP Location: Left Arm, Patient Position: Sitting, Cuff Size: Normal)   Pulse 72   Ht  5' 8.25" (1.734 m) Comment: height measured without shoes  Wt 252 lb 2 oz (114.4 kg)   BMI 38.06 kg/m  Constitutional:  Pleasant male in no acute distress. Psychiatric: Normal mood and affect. Behavior is normal. EENT: Pupils normal.  Conjunctivae are normal. No scleral icterus. Neck supple.  Cardiovascular: Normal rate, regular rhythm. No edema Pulmonary/chest: Effort normal and breath sounds normal. No wheezing, rales or rhonchi. Abdominal: Soft, nondistended, nontender. Bowel sounds active throughout. There are no masses palpable. No hepatomegaly. Neurological: Alert and oriented to person place and time. Skin: Skin is warm and dry. No rashes noted.  Willette Cluster, NP  03/01/2020, 9:43 AM  Cc:  Referring Provider Alysia Penna, MD

## 2020-03-02 ENCOUNTER — Other Ambulatory Visit: Payer: Self-pay

## 2020-03-02 ENCOUNTER — Encounter (HOSPITAL_BASED_OUTPATIENT_CLINIC_OR_DEPARTMENT_OTHER): Payer: Self-pay | Admitting: Urology

## 2020-03-02 ENCOUNTER — Other Ambulatory Visit: Payer: Self-pay | Admitting: Urology

## 2020-03-03 ENCOUNTER — Other Ambulatory Visit (HOSPITAL_COMMUNITY)
Admission: RE | Admit: 2020-03-03 | Discharge: 2020-03-03 | Disposition: A | Discharge status: Home / Self Care | Payer: 59 | Source: Ambulatory Visit | Attending: Urology | Admitting: Urology

## 2020-03-03 DIAGNOSIS — Z01812 Encounter for preprocedural laboratory examination: Secondary | ICD-10-CM | POA: Diagnosis present

## 2020-03-03 DIAGNOSIS — Z20822 Contact with and (suspected) exposure to covid-19: Secondary | ICD-10-CM | POA: Diagnosis not present

## 2020-03-04 LAB — SARS CORONAVIRUS 2 (TAT 6-24 HRS): SARS Coronavirus 2: NEGATIVE

## 2020-03-05 ENCOUNTER — Other Ambulatory Visit: Payer: Self-pay | Admitting: Urology

## 2020-03-07 ENCOUNTER — Ambulatory Visit (HOSPITAL_BASED_OUTPATIENT_CLINIC_OR_DEPARTMENT_OTHER)
Admission: RE | Admit: 2020-03-07 | Discharge: 2020-03-07 | Disposition: A | Discharge status: Home / Self Care | Payer: 59 | Attending: Urology | Admitting: Urology

## 2020-03-07 ENCOUNTER — Ambulatory Visit (HOSPITAL_BASED_OUTPATIENT_CLINIC_OR_DEPARTMENT_OTHER): Payer: 59 | Admitting: Anesthesiology

## 2020-03-07 ENCOUNTER — Other Ambulatory Visit: Payer: Self-pay

## 2020-03-07 ENCOUNTER — Encounter (HOSPITAL_BASED_OUTPATIENT_CLINIC_OR_DEPARTMENT_OTHER)
Admission: RE | Disposition: A | Discharge status: Home / Self Care | Payer: Self-pay | Source: Home / Self Care | Attending: Urology

## 2020-03-07 DIAGNOSIS — E119 Type 2 diabetes mellitus without complications: Secondary | ICD-10-CM | POA: Diagnosis not present

## 2020-03-07 DIAGNOSIS — Z6838 Body mass index (BMI) 38.0-38.9, adult: Secondary | ICD-10-CM | POA: Insufficient documentation

## 2020-03-07 DIAGNOSIS — Z9884 Bariatric surgery status: Secondary | ICD-10-CM | POA: Diagnosis not present

## 2020-03-07 DIAGNOSIS — N202 Calculus of kidney with calculus of ureter: Secondary | ICD-10-CM | POA: Diagnosis present

## 2020-03-07 DIAGNOSIS — Z87891 Personal history of nicotine dependence: Secondary | ICD-10-CM | POA: Insufficient documentation

## 2020-03-07 DIAGNOSIS — N23 Unspecified renal colic: Secondary | ICD-10-CM

## 2020-03-07 HISTORY — DX: Personal history of urinary calculi: Z87.442

## 2020-03-07 HISTORY — PX: CYSTOSCOPY/URETEROSCOPY/HOLMIUM LASER/STENT PLACEMENT: SHX6546

## 2020-03-07 LAB — GLUCOSE, CAPILLARY: Glucose-Capillary: 116 mg/dL — ABNORMAL HIGH (ref 70–99)

## 2020-03-07 SURGERY — CYSTOSCOPY/URETEROSCOPY/HOLMIUM LASER/STENT PLACEMENT
Anesthesia: General | Site: Ureter | Laterality: Left

## 2020-03-07 MED ORDER — ONDANSETRON HCL 4 MG/2ML IJ SOLN: 4.0000 mg | Freq: Once | INTRAMUSCULAR | Status: DC | PRN

## 2020-03-07 MED ORDER — HYDROMORPHONE HCL 1 MG/ML IJ SOLN
INTRAMUSCULAR | Status: AC
  Filled 2020-03-07: qty 1

## 2020-03-07 MED ORDER — DEXAMETHASONE SODIUM PHOSPHATE 10 MG/ML IJ SOLN
INTRAMUSCULAR | Status: AC
  Filled 2020-03-07: qty 1

## 2020-03-07 MED ORDER — CIPROFLOXACIN IN D5W 400 MG/200ML IV SOLN
INTRAVENOUS | Status: AC
  Filled 2020-03-07: qty 200

## 2020-03-07 MED ORDER — FENTANYL CITRATE (PF) 100 MCG/2ML IJ SOLN
INTRAMUSCULAR | Status: AC
  Filled 2020-03-07: qty 2

## 2020-03-07 MED ORDER — IOHEXOL 300 MG/ML  SOLN
INTRAMUSCULAR | Status: DC | PRN
  Administered 2020-03-07: 25 mL via URETHRAL

## 2020-03-07 MED ORDER — LIDOCAINE 2% (20 MG/ML) 5 ML SYRINGE
INTRAMUSCULAR | Status: DC | PRN
  Administered 2020-03-07: 80 mg via INTRAVENOUS

## 2020-03-07 MED ORDER — LIDOCAINE 2% (20 MG/ML) 5 ML SYRINGE
INTRAMUSCULAR | Status: AC
  Filled 2020-03-07: qty 5

## 2020-03-07 MED ORDER — ONDANSETRON HCL 4 MG/2ML IJ SOLN
INTRAMUSCULAR | Status: AC
  Filled 2020-03-07: qty 2

## 2020-03-07 MED ORDER — OXYCODONE HCL 5 MG PO TABS
ORAL_TABLET | ORAL | Status: AC
Start: 2020-03-07 — End: ?
  Filled 2020-03-07: qty 1

## 2020-03-07 MED ORDER — OXYCODONE HCL 5 MG PO TABS: 5.0000 mg | ORAL_TABLET | ORAL | 0 refills | Status: DC | PRN

## 2020-03-07 MED ORDER — FENTANYL CITRATE (PF) 100 MCG/2ML IJ SOLN
INTRAMUSCULAR | Status: DC | PRN
  Administered 2020-03-07 (×2): 50 ug via INTRAVENOUS

## 2020-03-07 MED ORDER — DEXAMETHASONE SODIUM PHOSPHATE 10 MG/ML IJ SOLN
INTRAMUSCULAR | Status: DC | PRN
  Administered 2020-03-07: 5 mg via INTRAVENOUS

## 2020-03-07 MED ORDER — CIPROFLOXACIN IN D5W 400 MG/200ML IV SOLN
400.0000 mg | Freq: Once | INTRAVENOUS | Status: AC
  Administered 2020-03-07: 400 mg via INTRAVENOUS

## 2020-03-07 MED ORDER — OXYCODONE HCL 5 MG PO TABS
5.0000 mg | ORAL_TABLET | Freq: Once | ORAL | Status: AC | PRN
  Administered 2020-03-07: 5 mg via ORAL

## 2020-03-07 MED ORDER — SODIUM CHLORIDE 0.9 % IR SOLN
Status: DC | PRN
  Administered 2020-03-07: 3000 mL via INTRAVESICAL

## 2020-03-07 MED ORDER — BELLADONNA ALKALOIDS-OPIUM 16.2-60 MG RE SUPP
RECTAL | Status: DC | PRN
  Administered 2020-03-07: 1 via RECTAL

## 2020-03-07 MED ORDER — KETOROLAC TROMETHAMINE 30 MG/ML IJ SOLN
INTRAMUSCULAR | Status: AC
  Filled 2020-03-07: qty 1

## 2020-03-07 MED ORDER — KETOROLAC TROMETHAMINE 30 MG/ML IJ SOLN
30.0000 mg | Freq: Once | INTRAMUSCULAR | Status: AC
  Administered 2020-03-07: 30 mg via INTRAVENOUS

## 2020-03-07 MED ORDER — HYDROMORPHONE HCL 1 MG/ML IJ SOLN
0.2500 mg | INTRAMUSCULAR | Status: DC | PRN
  Administered 2020-03-07 (×3): 0.5 mg via INTRAVENOUS

## 2020-03-07 MED ORDER — ONDANSETRON HCL 4 MG/2ML IJ SOLN
INTRAMUSCULAR | Status: DC | PRN
  Administered 2020-03-07: 4 mg via INTRAVENOUS

## 2020-03-07 MED ORDER — MIDAZOLAM HCL 5 MG/5ML IJ SOLN
INTRAMUSCULAR | Status: DC | PRN
  Administered 2020-03-07: 2 mg via INTRAVENOUS

## 2020-03-07 MED ORDER — MIDAZOLAM HCL 2 MG/2ML IJ SOLN
INTRAMUSCULAR | Status: AC
  Filled 2020-03-07: qty 2

## 2020-03-07 MED ORDER — PROPOFOL 10 MG/ML IV BOLUS
INTRAVENOUS | Status: DC | PRN
  Administered 2020-03-07: 200 mg via INTRAVENOUS

## 2020-03-07 MED ORDER — LACTATED RINGERS IV SOLN: INTRAVENOUS | Status: DC

## 2020-03-07 MED ORDER — BELLADONNA ALKALOIDS-OPIUM 16.2-60 MG RE SUPP
RECTAL | Status: AC
  Filled 2020-03-07: qty 1

## 2020-03-07 MED ORDER — OXYCODONE HCL 5 MG/5ML PO SOLN: 5.0000 mg | Freq: Once | ORAL | Status: AC | PRN

## 2020-03-07 SURGICAL SUPPLY — 20 items
BAG DRAIN URO-CYSTO SKYTR STRL (DRAIN) ×2 IMPLANT
BAG DRN UROCATH (DRAIN) ×1
BASKET LASER NITINOL 1.9FR (BASKET) IMPLANT
BSKT STON RTRVL 120 1.9FR (BASKET)
CATH URET 5FR 28IN OPEN ENDED (CATHETERS) ×2 IMPLANT
CATH URET DUAL LUMEN 6-10FR 50 (CATHETERS) IMPLANT
CLOTH BEACON ORANGE TIMEOUT ST (SAFETY) ×2 IMPLANT
EXTRACTOR STONE 1.7FRX115CM (UROLOGICAL SUPPLIES) ×2 IMPLANT
FIBER LASER TRAC TIP (UROLOGICAL SUPPLIES) IMPLANT
GLOVE BIO SURGEON STRL SZ7.5 (GLOVE) ×2 IMPLANT
GOWN STRL REUS W/TWL XL LVL3 (GOWN DISPOSABLE) ×2 IMPLANT
GUIDEWIRE STR DUAL SENSOR (WIRE) ×4 IMPLANT
IV NS IRRIG 3000ML ARTHROMATIC (IV SOLUTION) ×4 IMPLANT
KIT TURNOVER CYSTO (KITS) ×2 IMPLANT
MANIFOLD NEPTUNE II (INSTRUMENTS) ×2 IMPLANT
NS IRRIG 500ML POUR BTL (IV SOLUTION) ×2 IMPLANT
PACK CYSTO (CUSTOM PROCEDURE TRAY) ×2 IMPLANT
SYR CONTROL 10ML LL (SYRINGE) ×2 IMPLANT
TUBE CONNECTING 12X1/4 (SUCTIONS) IMPLANT
TUBING UROLOGY SET (TUBING) ×2 IMPLANT

## 2020-03-07 NOTE — Discharge Instructions (Signed)
[facility] °[phone] °Post Ureteroscopy With or Without Stent Instructions ° °Definitions: ° °Ureter: The duct that transports urine from the kidney to the bladder. °Stent:   A plastic hollow tube that is placed into the ureter, from the kidney to the bladder to prevent the ureter from swelling shut. ° °GENERAL INSTRUCTIONS: ° °Despite the fact that no skin incisions were used, the area around the ureter and bladder is raw and irritated. The stent is a foreign body which will further irritate the bladder wall. This irritation is manifested by increased frequency of urination, both day and night, and by an increase in the urge to urinate. In some, the urge to urinate is present almost always. Sometimes the urge is strong enough that you may not be able to stop yourself from urinating. The only real cure is to remove the stent and then give time for the bladder wall to heal which can't be done until the danger of the ureter swelling shut has passed, which varies. ° °You may see some blood in your urine while the stent is in place and a few days afterwards. Do not be alarmed, even if the urine was clear for a while. Get off your feet and drink lots of fluids until clearing occurs. If you start to pass clots or don't improve, call us. ° °DIET: °You may return to your normal diet immediately. Because of the raw surface of your bladder, alcohol, spicy foods, acid type foods and drinks with caffeine may cause irritation or frequency and should be used in moderation. To keep your urine flowing freely and to avoid constipation, drink plenty of fluids during the day ( 8-10 glasses ). °Tip: Avoid cranberry juice because it is very acidic. ° °ACTIVITY: °Your physical activity doesn't need to be restricted. However, if you are very active, you may see some blood in your urine. We suggest that you reduce your activity under these circumstances until the bleeding has stopped. ° °BOWELS: °It is important to  keep your bowels regular during the postoperative period. Straining with bowel movements can cause bleeding. A bowel movement every other day is reasonable. Use a mild laxative if needed, such as Milk of Magnesia 2-3 tablespoons, or 2 Dulcolax tablets. Call if you continue to have problems. If you have been taking narcotics for pain, before, during or after your surgery, you may be constipated. Take a laxative if necessary. ° ° °MEDICATION: °You should resume your pre-surgery medications unless told not to. In addition you will often be given an antibiotic to prevent infection. These should be taken as prescribed until the bottles are finished unless you are having an unusual reaction to one of the drugs. ° °PROBLEMS YOU SHOULD REPORT TO US: °Fevers over 100.5 Fahrenheit. °Heavy bleeding, or clots ( See above notes about blood in urine ). °Inability to urinate. °Drug reactions ( hives, rash, nausea, vomiting, diarrhea ). °Severe burning or pain with urination that is not improving. ° °FOLLOW-UP: °You will need a follow-up appointment to monitor your progress. Call for this appointment at the number listed above. Usually the first appointment will be about three to fourteen days after your surgery. ° ° ° °  ° ° ° °Post Anesthesia Home Care Instructions ° °Activity: °Get plenty of rest for the remainder of the day. A responsible individual must stay with you for 24 hours following the procedure.  °For the next 24 hours, DO NOT: °-Drive a car °-Operate machinery °-Drink alcoholic beverages °-Take any medication   unless instructed by your physician °-Make any legal decisions or sign important papers. ° °Meals: °Start with liquid foods such as gelatin or soup. Progress to regular foods as tolerated. Avoid greasy, spicy, heavy foods. If nausea and/or vomiting occur, drink only clear liquids until the nausea and/or vomiting subsides. Call your physician if vomiting continues. ° °Special Instructions/Symptoms: °Your throat  may feel dry or sore from the anesthesia or the breathing tube placed in your throat during surgery. If this causes discomfort, gargle with warm salt water. The discomfort should disappear within 24 hours. ° °

## 2020-03-07 NOTE — Op Note (Signed)
Preoperative diagnosis: left ureteral calculus  Postoperative diagnosis: left ureteral calculus  Procedure:  1. Cystoscopy 2. left ureteroscopy and basketing for stone removal 3. left 6F x 26 ureteral stent removal  4. left retrograde pyelography with interpretation  Surgeon: Crist Fat, MD  Anesthesia: General  Complications: None  Intraoperative findings: left, right retrograde pyelography demonstrated a filling defect within the left ureter consistent with the patient's known calculus without other abnormalities.  EBL: Minimal  Specimens: 1. left ureteral calculus  Disposition of specimens: [facility] for stone analysis  Indication: Albert White is a 44 y.o.   patient with a left ureteral stone and associated left symptoms.  The patient has had shockwave lithotripsy followed by stent placement and subsequent ureteroscopy.  There was some residual stones along the ureter as well as the kidney and he is opted to proceed with stone removal.   After reviewing the management options for treatment, the patient elected to proceed with the above surgical procedure(s). We have discussed the potential benefits and risks of the procedure, side effects of the proposed treatment, the likelihood of the patient achieving the goals of the procedure, and any potential problems that might occur during the procedure or recuperation. Informed consent has been obtained.   Description of procedure:  The patient was taken to the operating room and general anesthesia was induced.  The patient was placed in the dorsal lithotomy position, prepped and draped in the usual sterile fashion, and preoperative antibiotics were administered. A preoperative time-out was performed.   Cystourethroscopy was performed.  The patient's urethra was examined and was normal. The bladder was then systematically examined in its entirety. There was no evidence for any bladder tumors, stones, or  other mucosal pathology.  There is a stent emanating from the patient's left ureter which was brought to the urethral meatus and then a wire was passed through the stent up into the left renal pelvis.  The stent was subsequently removed and exchanged for an open-ended ureteral catheter.  A 5 French open-ended catheter was used to perform retrograde pyelogram with the above findings.  I subsequently replaced the wire and then with the semirigid ureteroscope advanced it up through the patient's urethra and into the bladder under visual guidance.  I was able to easily cannulate the left ureter and encountered stones in the distal ureter, 1 of which was embedded in the lateral sidewall.  Using an engage basket to remove these stones and cleared the ureter.  I then advanced the scope up to the UPJ and noted no additional stone fragments.  I then advanced a second wire through the scope and remove the scope over the wire.  I then passed the flexible ureteroscope over the wire and into the left renal pelvis.  Pyeloscopy was performed with fluoroscopic guidance.  The stones were then located in the lower pole which I was able to move up into the upper pole using irrigation.  I then grasped the stones and pulled them into the mid ureter one at a time.  I then reinspected the collecting system and noted no additional stone fragments.  Subsequently removed the flexible ureteroscope and exchanged for the semirigid scope and then cleared the ureter again with all the fragments brought down from the kidney.  Once the ureter was cleared I reinspected and noted no significant ureteral trauma.  There was some mild edema.  Subsequently removed the scope and then performed a retrograde pyelogram by exchanging the wire for a  5 [place] open-ended catheter.  The retrograde pyelogram was normal with no extravasation, no significant hydronephrosis or additional filling defects.  I then advanced the cystoscope into the patient's bladder and  emptied this the bladder and removed all the stones.  I monitored the left ureteral orifice noting brisk E flux of all the contrast.  Given that everything was draining I opted not to leave a stent in the patient given his difficulty with the stents prior.  I placed a B&O suppository in the patient's rectum and emptied the patient's bladder.  The patient subsequently extubated return to the PACU in stable condition.   Disposition:  The patient has been scheduled for followup in 6 weeks with a renal ultrasound.

## 2020-03-07 NOTE — Transfer of Care (Signed)
Immediate Anesthesia Transfer of Care Note  Patient: Albert White  Procedure(s) Performed: CYSTOSCOPY/RETROGRADE/URETEROSCOPY AND STONE BASKETTING (Left Ureter)  Patient Location: PACU  Anesthesia Type:General  Level of Consciousness: awake, alert  and patient cooperative  Airway & Oxygen Therapy: Patient Spontanous Breathing and Patient connected to nasal cannula oxygen  Post-op Assessment: Report given to RN and Post -op Vital signs reviewed and stable  Post vital signs: Reviewed and stable  Last Vitals:  Vitals Value Taken Time  BP    Temp    Pulse 66 03/07/20 1424  Resp 18 03/07/20 1424  SpO2 98 % 03/07/20 1424  Vitals shown include unvalidated device data.  Last Pain:  Vitals:   03/07/20 1132  TempSrc: Oral  PainSc: 8       Patients Stated Pain Goal: 4 (03/07/20 1132)  Complications: No complications documented.

## 2020-03-07 NOTE — Anesthesia Preprocedure Evaluation (Addendum)
Anesthesia Evaluation  Patient identified by MRN, date of birth, ID band Patient awake    Reviewed: Patient's Chart, lab work & pertinent test results  Airway Mallampati: II  TM Distance: >3 FB Neck ROM: Full    Dental  (+) Chipped, Missing   Pulmonary sleep apnea , former smoker,    Pulmonary exam normal        Cardiovascular negative cardio ROS   Rhythm:Regular Rate:Normal     Neuro/Psych Depression    GI/Hepatic Neg liver ROS, GERD  Medicated,  Endo/Other  diabetes, Well ControlledMorbid obesity  Renal/GU Renal diseasestones Bladder dysfunction      Musculoskeletal negative musculoskeletal ROS (+)   Abdominal (+)  Abdomen: soft. Bowel sounds: normal.  Peds  Hematology negative hematology ROS (+)   Anesthesia Other Findings   Reproductive/Obstetrics                            Anesthesia Physical Anesthesia Plan  ASA: II  Anesthesia Plan: General   Post-op Pain Management:    Induction: Intravenous  PONV Risk Score and Plan: 2 and Ondansetron, Dexamethasone and Treatment may vary due to age or medical condition  Airway Management Planned: Mask and LMA  Additional Equipment: None  Intra-op Plan:   Post-operative Plan: Extubation in OR  Informed Consent: I have reviewed the patients History and Physical, chart, labs and discussed the procedure including the risks, benefits and alternatives for the proposed anesthesia with the patient or authorized representative who has indicated his/her understanding and acceptance.     Dental advisory given  Plan Discussed with: CRNA and Surgeon  Anesthesia Plan Comments: (Lab Results      Component                Value               Date                      WBC                      6.4                 02/20/2020                HGB                      13.8                02/20/2020                HCT                      39.9                 02/20/2020                MCV                      86.9                02/20/2020                PLT                      206  02/20/2020           Lab Results      Component                Value               Date                      NA                       136                 02/20/2020                K                        3.3 (L)             02/20/2020                CO2                      24                  02/20/2020                GLUCOSE                  187 (H)             02/20/2020                BUN                      15                  02/20/2020                CREATININE               0.86                02/20/2020                CALCIUM                  8.3 (L)             02/20/2020                GFRNONAA                 >60                 02/20/2020                GFRAA                    >60                 02/20/2020           Covid-19 Nucleic Acid Test Results Lab Results      Component                Value               Date                      SARSCOV2NAA  NEGATIVE            03/03/2020                SARSCOV2NAA              NEGATIVE            02/21/2020                SARSCOV2NAA              NEGATIVE            02/13/2020           )       Anesthesia Quick Evaluation

## 2020-03-07 NOTE — H&P (Signed)
/08/2019:  [name] is a 44 year old male with a history of kidney stones. He is s/p left ESWL on 02/16/2020 for a 13 mm UPJ calculus. He subsequently developed intractable left-sided flank pain and vomiting that required left ureteral stent placement on 02/17/2020 with Dr. Arita MissPace. Following stent placement, the patient's flank pain and vomiting did not subside, which prompted another visit to the emergency department on 02/18/2020. CT from 02/18/2020 showed a small left perinephric hematoma with his left ureteral stent in good position. No other overt GU abnormalities were identified. Today, the patient continues to have intractable vomiting and continuous left-sided flank pain that have not improved with oral Dilaudid or Zofran suppositories. He denies any fevers at home and was afebrile in the emergency room. He is afebrile in the office today.   03/01/2020: Patient readmitted at time of last office visit for intractable pain and continued nausea/vomiting. He was taken to the operating room for left ureteroscopy and stent exchange with Dr. pace on 10/05. He was discharged on 10/6.   Review of operative note indicates multiple mid ureteral calculi were encountered and treated/removed. There was 1 stone that was noted to be impacted and grossly submucosal in the distal ureter at the 6 o'clock position. This was not treated. No additional ureteral calculi were noted at the conclusion of the procedure. A ureteral stent with tether string was left in place at the conclusion of the procedure.   Patient was discharged with a 10 day course of t.i.d. cephalexin. He continues this through today's appointment. He presents today for repeat evaluation with KUB and stent removal via tether under inhaled nitrous per patient request. Risks versus benefits of the procedure were discussed in detail with the patient with verbal and written consent obtained prior to today's procedure.   He continues to have intermittent  pain and discomfort radiating from the lower back to the flank and LLQ of the abdomen. Also associated with expected, continued irritative voiding symptoms (frequency, urgency, intermittent dysuria, intermittent passage of hematuria), he denies any interval stone material passage. No interval fevers or chills. Continued to have some intermittent nausea but no excessive vomiting, this has been well controlled with previously prescribed antiemtic. He tells me pain has not been well controlled with current pain medication on hand (Vicodin). He can not take NSAIDS due to prior history of bariatric surgery as well as noted hematoma of the left kidney from previous shockwave lithotripsy procedure.     ALLERGIES: None   MEDICATIONS: Hydrocodone-Acetaminophen 5 mg-325 mg tablet 1 tablet PO Q 6 H PRN  Hydromorphone Hcl 2 mg tablet 1 tablet PO Q 6 H PRN  Promethazine Hcl 12.5 mg suppository, rectal 1 suppository PR Q 6 H PRN  Trazodone Hcl 100 mg tablet     GU PSH: Cystoscopy Insert Stent, Left - 02/17/2020 ESWL, Left - 02/16/2020 Ureteroscopic laser litho, Left - 02/21/2020     NON-GU PSH: Cholecystectomy (laparoscopic) - 2020 Gastric bypass - 2018     GU PMH: Renal colic - 02/20/2020, - 02/17/2020, - 02/08/2020 Renal calculus - 02/17/2020, - 02/08/2020    NON-GU PMH: Anxiety GERD    FAMILY HISTORY: Death In The Family Father - Father Death In The Family Mother - Mother   SOCIAL HISTORY: Marital Status: Married Preferred Language: English; Ethnicity: Not Hispanic Or Latino; Race: White Current Smoking Status: Patient does not smoke anymore. Has not smoked since 01/17/2017. Smoked for 26 years. Smoked 1 pack per day.   Tobacco Use Assessment Completed:  Used Tobacco in last 30 days? Has never drank.  Drinks 2 caffeinated drinks per day. Patient's occupation Scientific laboratory technician.    REVIEW OF SYSTEMS:    GU Review Male:   Patient denies frequent urination, hard to postpone urination, burning/  pain with urination, get up at night to urinate, leakage of urine, stream starts and stops, trouble starting your stream, have to strain to urinate , erection problems, and penile pain.  Gastrointestinal (Upper):   Patient denies nausea, vomiting, and indigestion/ heartburn.  Gastrointestinal (Lower):   Patient denies diarrhea and constipation.  Constitutional:   Patient denies fever, night sweats, weight loss, and fatigue.  Skin:   Patient denies skin rash/ lesion and itching.  Eyes:   Patient denies blurred vision and double vision.  Ears/ Nose/ Throat:   Patient denies sore throat and sinus problems.  Hematologic/Lymphatic:   Patient denies swollen glands and easy bruising.  Cardiovascular:   Patient denies leg swelling and chest pains.  Respiratory:   Patient denies cough and shortness of breath.  Endocrine:   Patient denies excessive thirst.  Musculoskeletal:   Patient denies joint pain and back pain.  Neurological:   Patient denies headaches and dizziness.  Psychologic:   Patient denies depression and anxiety.   VITAL SIGNS:      03/01/2020 03:30 PM  Weight 254 lb / 115.21 kg  Height 70 in / 177.8 cm  BP 114/74 mmHg  Pulse 92 /min  Temperature 98.4 F / 36.8 C  BMI 36.4 kg/m   MULTI-SYSTEM PHYSICAL EXAMINATION:    Constitutional: Well-nourished. No physical deformities. Normally developed. Good grooming.  Neck: Neck symmetrical, not swollen. Normal tracheal position.  Respiratory: Normal breath sounds. No labored breathing, no use of accessory muscles.   Cardiovascular: Regular rate and rhythm. No murmur, no gallop. Normal temperature, normal extremity pulses, no swelling, no varicosities.   Skin: No paleness, no jaundice, no cyanosis. No lesion, no ulcer, no rash.  Neurologic / Psychiatric: Oriented to time, oriented to place, oriented to person. No depression, no anxiety, no agitation.   Gastrointestinal: Obese abdomen. No mass, no tenderness, no rigidity.   Musculoskeletal:  Normal gait and station of head and neck.     Complexity of Data:  Source Of History:  Patient, Medical Record Summary  Records Review:   Previous Doctor Records, Previous Hospital Records, Previous Patient Records  Urine Test Review:   Urinalysis, Urine Culture  X-Ray Review: KUB: Reviewed Films. Discussed With Patient.  C.T. Stone Protocol: Reviewed Films. Discussed With Patient.     03/01/20  Urinalysis  Urine Appearance Cloudy   Urine Color Orange   Urine Glucose Invalid mg/dL  Urine Bilirubin Invalid mg/dL  Urine Ketones Invalid mg/dL  Urine Specific Gravity Invalid   Urine Blood Invalid ery/uL  Urine pH Invalid   Urine Protein Invalid mg/dL  Urine Urobilinogen Invalid mg/dL  Urine Nitrites Invalid   Urine Leukocyte Esterase Invalid leu/uL  Urine WBC/hpf 10 - 20/hpf   Urine RBC/hpf >60/hpf   Urine Epithelial Cells 10 - 20/hpf   Urine Bacteria Mod (26-50/hpf)   Urine Mucous Not Present   Urine Yeast NS (Not Seen)   Urine Trichomonas Not Present   Urine Cystals NS (Not Seen)   Urine Casts NS (Not Seen)   Urine Sperm Not Present    PROCEDURES:         C.T. Urogram - [id number]      Patient confirmed No Neulasta OnPro Device.  KUB - [id number]  A single view of the abdomen is obtained. Ureteral stent remains in correct position with good proximal and distal curl noted. Tracing down the anatomical expected tract of the ureter using the ureteral stent as a guide along the distal course of the ureter there is a Steinstrasse of stone pieces noted adjacent to the stent. The collection of stone pieces measures 1.5cm in length. In addition to this there appears to be a collection of fragmented stone pieces in the mid-lower pole area of the left kidney.      Patient confirmed No Neulasta OnPro Device.           Urinalysis w/Scope Dipstick Dipstick Cont'd Micro  Color: Orange Bilirubin: Invalid mg/dL WBC/hpf: 10 - 40/JWJ  Appearance: Cloudy Ketones: Invalid mg/dL RBC/hpf:  >19/JYN  Specific Gravity: Invalid Blood: Invalid ery/uL Bacteria: Mod (26-50/hpf)  pH: Invalid Protein: Invalid mg/dL Cystals: NS (Not Seen)  Glucose: Invalid mg/dL Urobilinogen: Invalid mg/dL Casts: NS (Not Seen)    Nitrites: Invalid Trichomonas: Not Present    Leukocyte Esterase: Invalid leu/uL Mucous: Not Present      Epithelial Cells: 10 - 20/hpf      Yeast: NS (Not Seen)      Sperm: Not Present    Notes: invalid due to color    ASSESSMENT:      ICD-10 Details  1 GU:   Ureteral calculus - N20.1 Left, Acute, Systemic Symptoms   PLAN:            Medications Refill Meds: Hydromorphone Hcl 2 mg tablet 1 tablet PO Q 6 H PRN   #15  0 Refill(s)            Orders Labs Urine Culture  X-Rays: C.T. Stone Protocol Without Contrast  X-Ray Notes: History:  Hematuria: Yes/No  Patient to see MD after exam: Yes/No  Previous exam: CT / IVP/ US/ KUB/ None  When:  Where:  Diabetic: Yes/ No  BUN/ Creatine:  Date of last BUN Creatinine:  Weight in pounds:  Allergy- Contrasts/ Shellfish: Yes/ No  Conflicting diabetic meds: Yes/ No  Oral contrast and instructions given to patient:   Prior Authorization #: NPCR ref # [id number]            Schedule Return Visit/Planned Activity: 03/07/2020 - Schedule Surgery, Follow up MD          Document Letter(s):  Created for Patient: Clinical Summary         Notes:   KUB reviewed today with Dr Arita Miss. Repeat CT recommended to confirm presence of distal ureteral calculi before consideration of stent removal or setting patient up for repeat second look URS.   Review of CT imaging confirms the presence of distal collection of ureteral calculi adjacent to the ureteral stent. I reviewed with Dr Arita Miss who is arranging for pt. to undergo repeat URS mid-next week with Dr Marlou Porch.   For ureteroscopy I described the risks which include heart attack, stroke, pulmonary embolus, death, bleeding, infection, damage to contiguous structures,  positioning injury, ureteral stricture, ureteral avulsion, ureteral injury, need for ureteral stent, inability to perform ureteroscopy, need for an interval procedure, inability to clear stone burden, stent discomfort and pain.   All questions answered to the best by ability about upcoming procedure and expected postoperative course with understanding expressed by the patient. He will continue tamsulosin.Marland Kitchen He will complete previously prescribed antimicrobial therapy as directed. He has antinausea medication on hand to take if needed. I'll prescribe oral hydromorphone today  to use as needed for continued exacerbations of pain/discomfort. Repeat urine culture sent today as well. He will be contacted by the scheduler in the near future to schedule next week's planned URS.

## 2020-03-07 NOTE — Anesthesia Procedure Notes (Signed)
Procedure Name: LMA Insertion Date/Time: 03/07/2020 1:17 PM Performed by: Elyn Peers, CRNA Pre-anesthesia Checklist: Patient identified, Emergency Drugs available, Suction available, Patient being monitored and Timeout performed Patient Re-evaluated:Patient Re-evaluated prior to induction Oxygen Delivery Method: Circle system utilized Preoxygenation: Pre-oxygenation with 100% oxygen Induction Type: IV induction Ventilation: Mask ventilation without difficulty LMA: LMA inserted LMA Size: 5.0 Number of attempts: 1 Placement Confirmation: positive ETCO2 and breath sounds checked- equal and bilateral Tube secured with: Tape Dental Injury: Teeth and Oropharynx as per pre-operative assessment

## 2020-03-07 NOTE — Interval H&P Note (Signed)
History and Physical Interval Note:  03/07/2020 1:09 PM  Albert White  has presented today for surgery, with the diagnosis of LEFT RENAL AND URETERAL CALCULI.  The various methods of treatment have been discussed with the patient and family. After consideration of risks, benefits and other options for treatment, the patient has consented to  Procedure(s) with comments: CYSTOSCOPY/RETROGRADE/URETEROSCOPY/HOLMIUM LASER/STENT PLACEMENT (Left) - ONLY NEEDS 60 MIN as a surgical intervention.  The patient's history has been reviewed, patient examined, no change in status, stable for surgery.  I have reviewed the patient's chart and labs.  Questions were answered to the patient's satisfaction.     Crist Fat

## 2020-03-08 ENCOUNTER — Encounter (HOSPITAL_BASED_OUTPATIENT_CLINIC_OR_DEPARTMENT_OTHER): Payer: Self-pay | Admitting: Urology

## 2020-03-08 NOTE — Anesthesia Postprocedure Evaluation (Signed)
Anesthesia Post Note  Patient: Albert White  Procedure(s) Performed: CYSTOSCOPY/RETROGRADE/URETEROSCOPY AND STONE BASKETTING (Left Ureter)     Patient location during evaluation: PACU Anesthesia Type: General Level of consciousness: awake and alert Pain management: pain level controlled Vital Signs Assessment: post-procedure vital signs reviewed and stable Respiratory status: spontaneous breathing, nonlabored ventilation, respiratory function stable and patient connected to nasal cannula oxygen Cardiovascular status: blood pressure returned to baseline and stable Postop Assessment: no apparent nausea or vomiting Anesthetic complications: no   No complications documented.  Last Vitals:  Vitals:   03/07/20 1500 03/07/20 1550  BP: 111/70 121/70  Pulse: 65 (!) 58  Resp: 18 16  Temp:  36.6 C  SpO2: 96% 96%    Last Pain:  Vitals:   03/07/20 1550  TempSrc:   PainSc: 7                  Rainy Rothman P Tracie Lindbloom

## 2020-03-08 NOTE — Progress Notes (Signed)
Reviewed and agree with documentation and assessment and plan. K. Veena Dania Marsan , MD   

## 2020-03-13 ENCOUNTER — Other Ambulatory Visit: Payer: Self-pay

## 2020-03-13 ENCOUNTER — Ambulatory Visit (INDEPENDENT_AMBULATORY_CARE_PROVIDER_SITE_OTHER): Payer: 59

## 2020-03-13 ENCOUNTER — Encounter: Payer: 59 | Admitting: Gastroenterology

## 2020-03-13 ENCOUNTER — Ambulatory Visit
Admission: RE | Admit: 2020-03-13 | Discharge: 2020-03-13 | Disposition: A | Discharge status: Home / Self Care | Payer: 59 | Source: Ambulatory Visit

## 2020-03-13 VITALS — BP 132/87 | HR 75 | Temp 98.1°F | Resp 16

## 2020-03-13 DIAGNOSIS — M25512 Pain in left shoulder: Secondary | ICD-10-CM | POA: Diagnosis not present

## 2020-03-13 DIAGNOSIS — W19XXXA Unspecified fall, initial encounter: Secondary | ICD-10-CM | POA: Diagnosis not present

## 2020-03-13 MED ORDER — KETOROLAC TROMETHAMINE 15 MG/ML IJ SOLN
15.0000 mg | Freq: Once | INTRAMUSCULAR | Status: AC
  Administered 2020-03-13: 15 mg via INTRAMUSCULAR

## 2020-03-13 MED ORDER — TIZANIDINE HCL 2 MG PO TABS
2.0000 mg | ORAL_TABLET | Freq: Three times a day (TID) | ORAL | 0 refills | Status: AC | PRN
Start: 2020-03-13 — End: ?

## 2020-03-13 MED ORDER — HYDROCODONE-ACETAMINOPHEN 5-325 MG PO TABS
1.0000 | ORAL_TABLET | Freq: Four times a day (QID) | ORAL | 0 refills | Status: DC | PRN
Start: 2020-03-13 — End: 2023-04-14

## 2020-03-13 NOTE — ED Triage Notes (Signed)
Pt states slipped on grass yesterday and fell on lt side. Pt c/o lt shoulder and arm pain. States unable to lift lt arm and has tingling in lt fingers.

## 2020-03-13 NOTE — ED Provider Notes (Signed)
Albert White    CSN: [id number] Arrival date & time: 03/13/20  1837      History   Chief Complaint Chief Complaint  Patient presents with  . Shoulder Injury    HPI Albert White is a 44 y.o. male.   44 year old male comes in for left shoulder pain after injury last night. Slipped and fell, with direct impact to left shoulder. Denies head injury, loss of consciousness. Has had pain with decreased ROM since. Tingling sensation to the fingers.      Past Medical History:  Diagnosis Date  . Abscess   . Depression   . Diabetes mellitus without complication (HCC)    no problem since gastric bypass  . GERD (gastroesophageal reflux disease)   . Hidradenitis suppurativa   . History of kidney stones   . Hyperlipemia   . Kidney stones   . Morbid obesity (HCC)   . Vertigo     Patient Active Problem List   Diagnosis Date Noted  . Renal colic 02/20/2020  . Choledocholithiasis 06/06/2018  . Polysubstance (excluding opioids) dependence, daily use (HCC) 06/24/2017  . MDD (major depressive disorder), recurrent severe, without psychosis (HCC) 06/22/2017  . Major depressive disorder, single episode, severe (HCC) 06/21/2017  . Polysubstance abuse (HCC) 06/21/2017  . Pain management   . Hypomagnesemia   . Acute pulmonary edema (HCC)   . Peritonitis (HCC)   . Gastrointestinal anastomotic leak   . Pleural effusion on left   . Postprocedural intraabdominal abscess 01/15/2017  . Increased band cell count 01/06/2017  . Generalized edema 01/06/2017  . Former smoker 08/08/2016  . Morbid obesity with BMI of 50.0-59.9, adult (HCC) 06/25/2016  . OSA (obstructive sleep apnea) 06/25/2016  . Vitamin D deficiency 06/25/2016  . Type 2 diabetes mellitus without complication, without long-term current use of insulin (HCC) 02/29/2016  . Mixed hyperlipidemia 02/29/2016  . Cellulitis and abscess of neck 02/29/2016  . Cellulitis 12/14/2013  . Morbid obesity (HCC) 12/14/2013  .  Neck abscess 12/14/2013  . Hidradenitis 08/09/2012    Past Surgical History:  Procedure Laterality Date  . CHOLECYSTECTOMY N/A 06/09/2018   Procedure: LAPAROSCOPIC CHOLECYSTECTOMY WITH   INTRAOPERATIVE CHOLANGIOGRAM,;  Surgeon: Andria Meuse, MD;  Location: [facility] OR;  Service: General;  Laterality: N/A;  . CYSTOSCOPY W/ URETERAL STENT PLACEMENT Left 02/17/2020   Procedure: CYSTOSCOPY WITH RETROGRADE PYELOGRAM/URETERAL STENT PLACEMENT;  Surgeon: Noel Christmas, MD;  Location: [facility] [place];  Service: Urology;  Laterality: Left;  . CYSTOSCOPY WITH RETROGRADE PYELOGRAM, URETEROSCOPY AND STENT PLACEMENT Left 02/21/2020   Procedure: CYSTOSCOPY WITH LEFT RETROGRADE PYELOGRAM, URETEROSCOPY LASER  AND STENT PLACEMENT;  Surgeon: Noel Christmas, MD;  Location: [facility];  Service: Urology;  Laterality: Left;  . CYSTOSCOPY/URETEROSCOPY/HOLMIUM LASER/STENT PLACEMENT Left 03/07/2020   Procedure: CYSTOSCOPY/RETROGRADE/URETEROSCOPY AND STONE BASKETTING;  Surgeon: Crist Fat, MD;  [facility];  Service: Urology;  Laterality: Left;  ONLY NEEDS 60 MIN  . EXTRACORPOREAL SHOCK WAVE LITHOTRIPSY Left 02/16/2020   Procedure: EXTRACORPOREAL SHOCK WAVE LITHOTRIPSY (ESWL);  Surgeon: Alfredo Martinez, MD;  Location: [facility];  Service: Urology;  Laterality: Left;  Marland Kitchen GASTRIC BYPASS  11/2016  . INCISION AND DRAINAGE ABSCESS Right 12/15/2013   Procedure: INCISION AND DRAINAGE Right Posterior neck ABSCESS;  Surgeon: Cherylynn Ridges, MD;  Location: [facility] OR;  Service: General;  Laterality: Right;  . INCISION AND DRAINAGE DEEP NECK ABSCESS     Hidradenitis - 3-4 times at [facility] and [place]  .  IR CATHETER TUBE CHANGE  02/18/2017  . IR RADIOLOGIST EVAL & MGMT  02/11/2017  . IR RADIOLOGIST EVAL & MGMT  03/03/2017  . IR THORACENTESIS ASP PLEURAL SPACE W/IMG GUIDE  01/23/2017  . LAPAROSCOPIC LYSIS OF ADHESIONS N/A 06/09/2018   Procedure: LAPAROSCOPIC LYSIS OF ADHESIONS;   Surgeon: Andria Meuse, MD;  Location: [facility] OR;  Service: General;  Laterality: N/A;       Home Medications    Prior to Admission medications   Medication Sig Start Date End Date Taking? Authorizing Provider  buPROPion (WELLBUTRIN) 75 MG tablet Take 50 mg by mouth 2 (two) times daily.   Yes [provider]  HYDROcodone-acetaminophen (NORCO/VICODIN) 5-325 MG tablet Take 1-2 tablets by mouth every 6 (six) hours as needed for severe pain. 03/13/20   Cathie Hoops, Alilah Mcmeans V, PA-C  pantoprazole (PROTONIX) 40 MG tablet Take 40 mg by mouth daily.    [provider]  sertraline (ZOLOFT) 50 MG tablet Take 50 mg by mouth daily.    [provider]  tiZANidine (ZANAFLEX) 2 MG tablet Take 1-2 tablets (2-4 mg total) by mouth every 8 (eight) hours as needed for muscle spasms. 03/13/20   Cathie Hoops, Daquan Crapps V, PA-C  traZODone (DESYREL) 100 MG tablet Take 200 mg by mouth at bedtime.     [provider]    Family History Family History  Problem Relation Age of Onset  . Arthritis Mother   . Hypertension Mother   . Cancer Paternal Grandmother        type unkbown  . Cancer Paternal Aunt        type unkbown  . Cancer Paternal Uncle        type unkbown    Social History Social History   Tobacco Use  . Smoking status: Former Smoker    Packs/day: 1.00    Quit date: 11/27/2013    Years since quitting: 6.2  . Smokeless tobacco: Never Used  Vaping Use  . Vaping Use: Never used  Substance Use Topics  . Alcohol use: No  . Drug use: Not Currently     Allergies   Ibuprofen and Nsaids   Review of Systems Review of Systems  Reason unable to perform ROS: See HPI as above.     Physical Exam Triage Vital Signs ED Triage Vitals  Enc Vitals Group     BP 03/13/20 1848 132/87     Pulse Rate 03/13/20 1848 75     Resp 03/13/20 1848 16     Temp 03/13/20 1848 98.1 F (36.7 C)     Temp Source 03/13/20 1848 Oral     SpO2 03/13/20 1848 99 %     Weight --      Height --      Head  Circumference --      Peak Flow --      Pain Score 03/13/20 1853 10     Pain Loc --      Pain Edu? --      Excl. in GC? --    No data found.  Updated Vital Signs BP 132/87 (BP Location: Left Arm)   Pulse 75   Temp 98.1 F (36.7 C) (Oral)   Resp 16   SpO2 99%   Physical Exam Constitutional:      General: He is not in acute distress.    Appearance: Normal appearance. He is well-developed. He is not toxic-appearing or diaphoretic.  HENT:     Head: Normocephalic and atraumatic.  Eyes:  Conjunctiva/sclera: Conjunctivae normal.     Pupils: Pupils are equal, round, and reactive to light.  Pulmonary:     Effort: Pulmonary effort is normal. No respiratory distress.  Musculoskeletal:     Cervical back: Normal range of motion and neck supple.     Comments: No obvious swelling, contusion, deformity. No tenderness to spinous processes, neck. Tenderness to palpation diffusely of the shoulder. No focal tenderness to the clavicle. ROM and sensation intact distally. Decreased strength due to shoulder pain. Radial pulse 2+  Skin:    General: Skin is warm and dry.  Neurological:     Mental Status: He is alert and oriented to person, place, and time.      UC Treatments / Results  Labs (all labs ordered are listed, but only abnormal results are displayed) Labs Reviewed - No data to display  EKG   Radiology DG Shoulder Left  Result Date: 03/13/2020 CLINICAL DATA:  Shoulder pain after fall EXAM: LEFT SHOULDER - 2+ VIEW COMPARISON:  None. FINDINGS: AC joint is intact. No fracture or malalignment at the glenohumeral interview. Suspected linear osseous fragment adjacent to the inferior scapula on Y-view. IMPRESSION: Possible linear osseous fragment adjacent to the inferior scapula. CT may be helpful in further clarifying the findings. Electronically Signed   By: Jasmine Pang M.D.   On: 03/13/2020 19:44    Procedures Procedures (including critical White time)  Medications Ordered in  UC Medications  ketorolac (TORADOL) 15 MG/ML injection 15 mg (15 mg Intramuscular Given 03/13/20 1955)    Initial Impression / Assessment and Plan / UC Course  I have reviewed the triage vital signs and the nursing notes.  Pertinent labs & imaging results that were available during my White of the patient were reviewed by me and considered in my medical decision making (see chart for details).    Discussed xray results with patient. Radiology recommended CT scan. At this time NVI, will have patient follow up with ortho for further evaluation. Unable to take oral NSAIDs due to gastric bypass. Has tolerated IM toradol, will provide today. Norco/tylenol as needed for pain. Tizanidine as needed for muscle spasm/pain. Return precautions given. Otherwise follow up with ortho for further evaluation.  Patient has had multiple narcotic Rx in the past month, however, this coincides with ED visits/surgery that is medically appropriate.   Final Clinical Impressions(s) / UC Diagnoses   Final diagnoses:  Acute pain of left shoulder   ED Prescriptions    Medication Sig Dispense Auth. Provider   HYDROcodone-acetaminophen (NORCO/VICODIN) 5-325 MG tablet Take 1-2 tablets by mouth every 6 (six) hours as needed for severe pain. 10 tablet Marika Mahaffy V, PA-C   tiZANidine (ZANAFLEX) 2 MG tablet Take 1-2 tablets (2-4 mg total) by mouth every 8 (eight) hours as needed for muscle spasms. 10 tablet Belinda Fisher, PA-C     I have reviewed the PDMP during this encounter.   Belinda Fisher, PA-C 03/13/20 2000

## 2020-03-13 NOTE — Discharge Instructions (Signed)
As discussed, radiologist suggest CT scan for further evaluation. Toradol injection in office today. Norco as needed for pain. Tizanidine as needed for muscle tightness/spasm. Follow up with orthopedics for further evaluation needed. If loss of grip strength, numbness to hand, go to the ED for further evaluation.

## 2020-04-20 ENCOUNTER — Encounter: Payer: 59 | Admitting: Gastroenterology

## 2020-04-23 ENCOUNTER — Other Ambulatory Visit: Payer: Self-pay | Admitting: Gastroenterology

## 2020-04-23 LAB — SARS CORONAVIRUS 2 (TAT 6-24 HRS): SARS Coronavirus 2: NEGATIVE

## 2020-04-25 ENCOUNTER — Ambulatory Visit (AMBULATORY_SURGERY_CENTER): Payer: 59 | Admitting: Gastroenterology

## 2020-04-25 ENCOUNTER — Encounter: Payer: Self-pay | Admitting: Gastroenterology

## 2020-04-25 ENCOUNTER — Other Ambulatory Visit: Payer: Self-pay

## 2020-04-25 VITALS — BP 112/67 | HR 54 | Temp 98.0°F | Resp 15 | Ht 68.0 in | Wt 252.0 lb

## 2020-04-25 DIAGNOSIS — R1011 Right upper quadrant pain: Secondary | ICD-10-CM | POA: Diagnosis present

## 2020-04-25 DIAGNOSIS — K2980 Duodenitis without bleeding: Secondary | ICD-10-CM | POA: Diagnosis not present

## 2020-04-25 MED ORDER — SUCRALFATE 1 G PO TABS: 1.0000 g | ORAL_TABLET | Freq: Four times a day (QID) | ORAL | 1 refills | Status: DC

## 2020-04-25 MED ORDER — FAMOTIDINE 20 MG PO TABS: 20.0000 mg | ORAL_TABLET | Freq: Two times a day (BID) | ORAL | 11 refills | Status: AC

## 2020-04-25 MED ORDER — SODIUM CHLORIDE 0.9 % IV SOLN: 500.0000 mL | Freq: Once | INTRAVENOUS | Status: DC

## 2020-04-25 NOTE — Progress Notes (Signed)
To PACU, VSS. Report to RN.tb 

## 2020-04-25 NOTE — Patient Instructions (Signed)
Please read handouts provided. Continue present medications. Await pathology results. Use Pepcid ( famotodine ) 20 mg twice daily. Use sucralfate tablets 1 gram four times daily for 1 month. No aspirin, ibuprofen, naproxen, or other non-steriodal anti-inflammatory drugs. Lactulose breath test.       YOU HAD AN ENDOSCOPIC PROCEDURE TODAY AT THE [facility]:   Refer to the procedure report that was given to you for any specific questions about what was found during the examination.  If the procedure report does not answer your questions, please call your gastroenterologist to clarify.  If you requested that your care partner not be given the details of your procedure findings, then the procedure report has been included in a sealed envelope for you to review at your convenience later.  YOU SHOULD EXPECT: Some feelings of bloating in the abdomen. Passage of more gas than usual.  Walking can help get rid of the air that was put into your GI tract during the procedure and reduce the bloating. If you had a lower endoscopy (such as a colonoscopy or flexible sigmoidoscopy) you may notice spotting of blood in your stool or on the toilet paper. If you underwent a bowel prep for your procedure, you may not have a normal bowel movement for a few days.  Please Note:  You might notice some irritation and congestion in your nose or some drainage.  This is from the oxygen used during your procedure.  There is no need for concern and it should clear up in a day or so.  SYMPTOMS TO REPORT IMMEDIATELY:    Following upper endoscopy (EGD)  Vomiting of blood or coffee ground material  New chest pain or pain under the shoulder blades  Painful or persistently difficult swallowing  New shortness of breath  Fever of 100F or higher  Black, tarry-looking stools  For urgent or emergent issues, a gastroenterologist can be reached at any hour by calling [phone]. Do not use MyChart messaging for  urgent concerns.    DIET:  We do recommend a small meal at first, but then you may proceed to your regular diet.  Drink plenty of fluids but you should avoid alcoholic beverages for 24 hours.  ACTIVITY:  You should plan to take it easy for the rest of today and you should NOT DRIVE or use heavy machinery until tomorrow (because of the sedation medicines used during the test).    FOLLOW UP: Our staff will call the number listed on your records 48-72 hours following your procedure to check on you and address any questions or concerns that you may have regarding the information given to you following your procedure. If we do not reach you, we will leave a message.  We will attempt to reach you two times.  During this call, we will ask if you have developed any symptoms of COVID 19. If you develop any symptoms (ie: fever, flu-like symptoms, shortness of breath, cough etc.) before then, please call [phone].  If you test positive for Covid 19 in the 2 weeks post procedure, please call and report this information to [place].    If any biopsies were taken you will be contacted by phone or by letter within the next 1-3 weeks.  Please call us at [phone] if you have not heard about the biopsies in 3 weeks.    SIGNATURES/CONFIDENTIALITY: You and/or your care partner have signed paperwork which will be entered into your electronic medical record.  These signatures attest  to the fact that that the information above on your After Visit Summary has been reviewed and is understood.  Full responsibility of the confidentiality of this discharge information lies with you and/or your care-partner. 

## 2020-04-25 NOTE — Op Note (Signed)
[facility] Patient Name: Albert White Procedure Date: 04/25/2020 8:22 AM MRN: [id number] Endoscopist: Napoleon Form , MD Age: 44 Referring MD:  Date of Birth: [birth year] Gender: Male Account #: [id number] Procedure:                Upper GI endoscopy Indications:              Epigastric abdominal pain, Abdominal pain in the                            right upper quadrant Medicines:                Monitored Anesthesia Care Procedure:                Pre-Anesthesia Assessment:                           - Prior to the procedure, a History and Physical                            was performed, and patient medications and                            allergies were reviewed. The patient's tolerance of                            previous anesthesia was also reviewed. The risks                            and benefits of the procedure and the sedation                            options and risks were discussed with the patient.                            All questions were answered, and informed consent                            was obtained. Prior Anticoagulants: The patient has                            taken no previous anticoagulant or antiplatelet                            agents. ASA Grade Assessment: II - A patient with                            mild systemic disease. After reviewing the risks                            and benefits, the patient was deemed in                            satisfactory condition to undergo the procedure.  After obtaining informed consent, the endoscope was                            passed under direct vision. Throughout the                            procedure, the patient's blood pressure, pulse, and                            oxygen saturations were monitored continuously. The                            Endoscope was introduced through the mouth, and                            advanced to the second part of  duodenum. The upper                            GI endoscopy was accomplished without difficulty.                            The patient tolerated the procedure well. Scope In: Scope Out: Findings:                 The Z-line was regular and was found 38 cm from the                            incisors.                           No gross lesions were noted in the entire esophagus.                           Evidence of a gastric bypass was found. A gastric                            pouch with a normal size was found. The staple line                            appeared intact. The gastrojejunal anastomosis was                            characterized by erosion and erythema. This was                            traversed. The pouch-to-jejunum limb was                            characterized by erythema. The jejunojejunal                            anastomosis was characterized by erythema. The  duodenum-to-jejunum limb was not examined as it                            could not be traversed. Biopsies were taken with a                            cold forceps for histology in the jejunum. Biopsies                            were taken with a cold forceps for Helicobacter                            pylori testing in the stomach. Complications:            No immediate complications. Estimated Blood Loss:     Estimated blood loss was minimal. Impression:               - Z-line regular, 38 cm from the incisors.                           - No gross lesions in esophagus.                           - Gastric bypass with a normal-sized pouch and                            intact staple line. Gastrojejunal anastomosis                            characterized by erosion and erythema. Biopsied. Recommendation:           - Patient has a contact number available for                            emergencies. The signs and symptoms of potential                            delayed  complications were discussed with the                            patient. Return to normal activities tomorrow.                            Written discharge instructions were provided to the                            patient.                           - Resume previous diet.                           - Continue present medications.                           - Await pathology results.                           -  Use Pepcid (famotidine) 20 mg PO BID.                           - Use sucralfate tablets 1 gram PO QID for 1 month.                           - No aspirin, ibuprofen, naproxen, or other                            non-steroidal anti-inflammatory drugs.                           - Schedule lactulose breath test Napoleon Form, MD 04/25/2020 8:51:54 AM This report has been signed electronically.

## 2020-04-25 NOTE — Progress Notes (Signed)
Called to room to assist during endoscopic procedure.  Patient ID and intended procedure confirmed with present staff. Received instructions for my participation in the procedure from the performing physician.  

## 2020-04-25 NOTE — Progress Notes (Signed)
VS by MO.

## 2020-04-27 ENCOUNTER — Telehealth: Payer: Self-pay

## 2020-04-27 ENCOUNTER — Telehealth: Payer: Self-pay | Admitting: *Deleted

## 2020-04-27 NOTE — Telephone Encounter (Signed)
  Follow up Call-  Call back number 04/25/2020  Post procedure Call Back phone  # [phone]  Permission to leave phone message Yes  Some recent data might be hidden     Patient questions:  Do you have a fever, pain , or abdominal swelling? No. Pain Score  0 *  Have you tolerated food without any problems? Yes.    Have you been able to return to your normal activities? Yes.    Do you have any questions about your discharge instructions: Diet   No. Medications  No. Follow up visit  No.  Do you have questions or concerns about your Care? No.  Actions: * If pain score is 4 or above: No action needed, pain <4.  1. Have you developed a fever since your procedure? no  2.   Have you had an respiratory symptoms (SOB or cough) since your procedure? no  3.   Have you tested positive for COVID 19 since your procedure no  4.   Have you had any family members/close contacts diagnosed with the COVID 19 since your procedure?  no   If yes to any of these questions please route to Laverna Peace, RN and Karlton Lemon, RN

## 2020-04-27 NOTE — Telephone Encounter (Signed)
Attempted to reach patient for post-procedure f/u call. No answer. Left message that we will make another attempt to reach him again later today and for him to please not hesitate to call us if he has any questions/concerns regarding his care. 

## 2020-05-14 ENCOUNTER — Encounter: Payer: Self-pay | Admitting: Gastroenterology

## 2020-12-11 ENCOUNTER — Ambulatory Visit: Payer: 59 | Admitting: Gastroenterology

## 2022-02-17 ENCOUNTER — Encounter (HOSPITAL_BASED_OUTPATIENT_CLINIC_OR_DEPARTMENT_OTHER): Payer: Self-pay | Admitting: Emergency Medicine

## 2022-02-17 ENCOUNTER — Emergency Department (HOSPITAL_BASED_OUTPATIENT_CLINIC_OR_DEPARTMENT_OTHER): Payer: 59

## 2022-02-17 ENCOUNTER — Other Ambulatory Visit: Payer: Self-pay

## 2022-02-17 ENCOUNTER — Emergency Department (HOSPITAL_BASED_OUTPATIENT_CLINIC_OR_DEPARTMENT_OTHER)
Admission: EM | Admit: 2022-02-17 | Discharge: 2022-02-17 | Disposition: A | Discharge status: Home / Self Care | Payer: 59 | Attending: Emergency Medicine | Admitting: Emergency Medicine

## 2022-02-17 ENCOUNTER — Other Ambulatory Visit (HOSPITAL_BASED_OUTPATIENT_CLINIC_OR_DEPARTMENT_OTHER): Payer: Self-pay

## 2022-02-17 DIAGNOSIS — R112 Nausea with vomiting, unspecified: Secondary | ICD-10-CM | POA: Diagnosis not present

## 2022-02-17 DIAGNOSIS — R1084 Generalized abdominal pain: Secondary | ICD-10-CM | POA: Diagnosis present

## 2022-02-17 LAB — COMPREHENSIVE METABOLIC PANEL
ALT: 19 U/L (ref 0–44)
AST: 18 U/L (ref 15–41)
Albumin: 4.8 g/dL (ref 3.5–5.0)
Alkaline Phosphatase: 74 U/L (ref 38–126)
Anion gap: 15 (ref 5–15)
BUN: 9 mg/dL (ref 6–20)
CO2: 21 mmol/L — ABNORMAL LOW (ref 22–32)
Calcium: 9.7 mg/dL (ref 8.9–10.3)
Chloride: 106 mmol/L (ref 98–111)
Creatinine, Ser: 0.97 mg/dL (ref 0.61–1.24)
GFR, Estimated: >60 mL/min (ref >60)
Glucose, Bld: 139 mg/dL — ABNORMAL HIGH (ref 70–99)
Potassium: 3.9 mmol/L (ref 3.5–5.1)
Sodium: 142 mmol/L (ref 135–145)
Total Bilirubin: 0.8 mg/dL (ref 0.3–1.2)
Total Protein: 7.5 g/dL (ref 6.5–8.1)

## 2022-02-17 LAB — CBC WITH DIFFERENTIAL/PLATELET
Abs Immature Granulocytes: 0.02 10*3/uL (ref 0.00–0.07)
Basophils Absolute: 0 10*3/uL (ref 0.0–0.1)
Basophils Relative: 0 %
Eosinophils Absolute: 0.2 10*3/uL (ref 0.0–0.5)
Eosinophils Relative: 2 %
HCT: 46.1 % (ref 39.0–52.0)
Hemoglobin: 15.9 g/dL (ref 13.0–17.0)
Immature Granulocytes: 0 %
Lymphocytes Relative: 22 %
Lymphs Abs: 1.5 10*3/uL (ref 0.7–4.0)
MCH: 29.8 pg (ref 26.0–34.0)
MCHC: 34.5 g/dL (ref 30.0–36.0)
MCV: 86.3 fL (ref 80.0–100.0)
Monocytes Absolute: 0.3 10*3/uL (ref 0.1–1.0)
Monocytes Relative: 4 %
Neutro Abs: 5 10*3/uL (ref 1.7–7.7)
Neutrophils Relative %: 72 %
Platelets: 205 10*3/uL (ref 150–400)
RBC: 5.34 MIL/uL (ref 4.22–5.81)
RDW: 12.4 % (ref 11.5–15.5)
WBC: 7 10*3/uL (ref 4.0–10.5)
nRBC: 0 % (ref 0.0–0.2)

## 2022-02-17 LAB — LIPASE, BLOOD: Lipase: 28 U/L (ref 11–51)

## 2022-02-17 LAB — CBG MONITORING, ED: Glucose-Capillary: 128 mg/dL — ABNORMAL HIGH (ref 70–99)

## 2022-02-17 MED ORDER — IOHEXOL 300 MG/ML  SOLN
100.0000 mL | Freq: Once | INTRAMUSCULAR | Status: AC | PRN
  Administered 2022-02-17: 85 mL via INTRAVENOUS

## 2022-02-17 MED ORDER — HALOPERIDOL LACTATE 5 MG/ML IJ SOLN
5.0000 mg | Freq: Once | INTRAMUSCULAR | Status: AC
  Administered 2022-02-17: 5 mg via INTRAVENOUS
  Filled 2022-02-17: qty 1

## 2022-02-17 MED ORDER — ONDANSETRON HCL 4 MG/2ML IJ SOLN
4.0000 mg | Freq: Once | INTRAMUSCULAR | Status: AC
  Administered 2022-02-17: 4 mg via INTRAVENOUS
  Filled 2022-02-17: qty 2

## 2022-02-17 MED ORDER — PANTOPRAZOLE SODIUM 20 MG PO TBEC
20.0000 mg | DELAYED_RELEASE_TABLET | Freq: Every day | ORAL | 0 refills | Status: DC
  Filled 2022-02-17: qty 14, 14d supply, fill #0

## 2022-02-17 MED ORDER — SODIUM CHLORIDE 0.9 % IV BOLUS
1000.0000 mL | Freq: Once | INTRAVENOUS | Status: AC
  Administered 2022-02-17: 1000 mL via INTRAVENOUS

## 2022-02-17 MED ORDER — DIPHENHYDRAMINE HCL 50 MG/ML IJ SOLN
12.5000 mg | Freq: Once | INTRAMUSCULAR | Status: AC
  Administered 2022-02-17: 12.5 mg via INTRAVENOUS
  Filled 2022-02-17: qty 1

## 2022-02-17 MED ORDER — SUCRALFATE 1 G PO TABS
1.0000 g | ORAL_TABLET | Freq: Three times a day (TID) | ORAL | 0 refills | Status: DC
  Filled 2022-02-17: qty 14, 4d supply, fill #0
  Filled 2022-02-17: qty 42, 10d supply, fill #0

## 2022-02-17 MED ORDER — ONDANSETRON HCL 4 MG PO TABS
4.0000 mg | ORAL_TABLET | Freq: Four times a day (QID) | ORAL | 0 refills | Status: AC
  Filled 2022-02-17: qty 12, 3d supply, fill #0

## 2022-02-17 MED ORDER — HYDROMORPHONE HCL 1 MG/ML IJ SOLN
1.0000 mg | Freq: Once | INTRAMUSCULAR | Status: AC
  Administered 2022-02-17: 1 mg via INTRAVENOUS
  Filled 2022-02-17: qty 1

## 2022-02-17 NOTE — ED Notes (Signed)
Pt aware of the need for urine. Unable to currently. 

## 2022-02-17 NOTE — ED Provider Notes (Signed)
[facility] EMERGENCY DEPT Provider Note   CSN: [id number] Arrival date & time: 02/17/22  1053     History  Chief Complaint  Patient presents with   Abdominal Pain    Albert White is a 46 y.o. male.  Patient is here with abdominal pain.  History of cholecystectomy, gastric bypass, kidney stones, lysis of adhesions.  He has been having some minor abdominal pain the last several weeks but worse suddenly this morning.  Mostly in the lower abdomen.  He had nausea and vomiting.  He is not sure if he is passed gas today.  He is not had any constipation or diarrhea.  Denies any fevers.  He is having a lot of chills and very uncomfortable and having active nausea and dry heaving.  Denies any pain with urination.  Denies any flank pain.  The history is provided by the patient.       Home Medications Prior to Admission medications   Medication Sig Start Date End Date Taking? Authorizing Provider  ondansetron (ZOFRAN) 4 MG tablet Take 1 tablet (4 mg total) by mouth every 6 (six) hours. 02/17/22  Yes Jayden Kratochvil, DO  pantoprazole (PROTONIX) 20 MG tablet Take 1 tablet (20 mg total) by mouth daily for 14 days. 02/17/22 03/03/22 Yes Reeta Kuk, DO  sucralfate (CARAFATE) 1 g tablet Take 1 tablet (1 g total) by mouth 4 (four) times daily -  with meals and at bedtime for 14 days. 02/17/22 03/03/22 Yes Laniqua Torrens, DO  buPROPion (WELLBUTRIN) 75 MG tablet Take 50 mg by mouth 2 (two) times daily.    [provider]  famotidine (PEPCID) 20 MG tablet Take 1 tablet (20 mg total) by mouth 2 (two) times daily. 04/25/20   Mauri Pole, MD  HYDROcodone-acetaminophen (NORCO/VICODIN) 5-325 MG tablet Take 1-2 tablets by mouth every 6 (six) hours as needed for severe pain. 03/13/20   Tasia Catchings, Amy V, PA-C  sertraline (ZOLOFT) 50 MG tablet Take 50 mg by mouth daily.    [provider]  tiZANidine (ZANAFLEX) 2 MG tablet Take 1-2 tablets (2-4 mg total) by mouth every 8  (eight) hours as needed for muscle spasms. 03/13/20   Tasia Catchings, Amy V, PA-C  traZODone (DESYREL) 100 MG tablet Take 200 mg by mouth at bedtime.     [provider]      Allergies    Ibuprofen and Nsaids    Review of Systems   Review of Systems  Physical Exam Updated Vital Signs BP 128/74   Pulse 66   Temp 98.2 F (36.8 C) (Oral)   Resp 18   Ht 5\' 8"  (1.727 m)   Wt 117.9 kg   SpO2 98%   BMI 39.53 kg/m  Physical Exam Vitals and nursing note reviewed.  Constitutional:      General: He is in acute distress.     Appearance: He is well-developed. He is ill-appearing.  HENT:     Head: Normocephalic and atraumatic.     Mouth/Throat:     Mouth: Mucous membranes are moist.  Eyes:     Extraocular Movements: Extraocular movements intact.     Conjunctiva/sclera: Conjunctivae normal.  Cardiovascular:     Rate and Rhythm: Normal rate and regular rhythm.     Heart sounds: No murmur heard. Pulmonary:     Effort: Pulmonary effort is normal. No respiratory distress.     Breath sounds: Normal breath sounds.  Abdominal:     General: Abdomen is flat.  Palpations: Abdomen is soft.     Tenderness: There is generalized abdominal tenderness.  Musculoskeletal:        General: No swelling.     Cervical back: Neck supple.  Skin:    General: Skin is warm and dry.     Capillary Refill: Capillary refill takes less than 2 seconds.  Neurological:     Mental Status: He is alert.  Psychiatric:        Mood and Affect: Mood normal.     ED Results / Procedures / Treatments   Labs (all labs ordered are listed, but only abnormal results are displayed) Labs Reviewed  COMPREHENSIVE METABOLIC PANEL - Abnormal; Notable for the following components:      Result Value   CO2 21 (*)    Glucose, Bld 139 (*)    All other components within normal limits  CBG MONITORING, ED - Abnormal; Notable for the following components:   Glucose-Capillary 128 (*)    All other components within normal limits   CBC WITH DIFFERENTIAL/PLATELET  LIPASE, BLOOD    EKG EKG Interpretation  Date/Time:  Monday February 17 2022 11:29:21 EDT Ventricular Rate:  64 PR Interval:    QRS Duration: 102 QT Interval:  429 QTC Calculation: 443 R Axis:   62 Text Interpretation: Normal sinus rhythm Low voltage, precordial leads Abnormal R-wave progression, early transition Baseline wander in lead(s) V3 Confirmed by Virgina Norfolk ([id number]) on 02/17/2022 11:31:06 AM  Radiology CT ABDOMEN PELVIS W CONTRAST  Result Date: 02/17/2022 CLINICAL DATA:  Abdominal pain.  Acute nonlocalized abdominal pain EXAM: CT ABDOMEN AND PELVIS WITH CONTRAST TECHNIQUE: Multidetector CT imaging of the abdomen and pelvis was performed using the standard protocol following bolus administration of intravenous contrast. RADIATION DOSE REDUCTION: This exam was performed according to the departmental dose-optimization program which includes automated exposure control, adjustment of the mA and/or kV according to patient size and/or use of iterative reconstruction technique. CONTRAST:  79mL OMNIPAQUE IOHEXOL 300 MG/ML  SOLN COMPARISON:  None Available. FINDINGS: Lower chest: Lung bases are clear. Hepatobiliary: No focal hepatic lesion. Postcholecystectomy. No biliary dilatation. Pancreas: Pancreas is normal. No ductal dilatation. No pancreatic inflammation. Spleen: Normal spleen Adrenals/urinary tract: Adrenal glands and kidneys are normal. The ureters and bladder normal. Stomach/Bowel: Post bariatric surgery. No bowel obstruction or inflammation. The colon and rectosigmoid colon are normal. Vascular/Lymphatic: Abdominal aorta is normal caliber with atherosclerotic calcification. There is no retroperitoneal or periportal lymphadenopathy. No pelvic lymphadenopathy. Reproductive: Unremarkable Other: No free fluid. Musculoskeletal: No aggressive osseous lesion. IMPRESSION: 1. Post bariatric surgery without complicating features. 2. No bowel obstruction or  inflammation. 3.  Aortic Atherosclerosis (ICD10-I70.0). Electronically Signed   By: Genevive Bi M.D.   On: 02/17/2022 12:54    Procedures Procedures    Medications Ordered in ED Medications  HYDROmorphone (DILAUDID) injection 1 mg (1 mg Intravenous Given 02/17/22 1127)  ondansetron (ZOFRAN) injection 4 mg (4 mg Intravenous Given 02/17/22 1127)  sodium chloride 0.9 % bolus 1,000 mL (0 mLs Intravenous Stopped 02/17/22 1259)  haloperidol lactate (HALDOL) injection 5 mg (5 mg Intravenous Given 02/17/22 1141)  diphenhydrAMINE (BENADRYL) injection 12.5 mg (12.5 mg Intravenous Given 02/17/22 1140)  iohexol (OMNIPAQUE) 300 MG/ML solution 100 mL (85 mLs Intravenous Contrast Given 02/17/22 1222)    ED Course/ Medical Decision Making/ A&P                           Medical Decision Making Amount and/or Complexity of Data  Reviewed Labs: ordered. Radiology: ordered.  Risk Prescription drug management.   Charlyne Petrin is here with abdominal pain.  History of kidney stones, diabetes, gastric bypass.  Differential diagnosis is bowel obstructio versus kidney stone versus UTI versus colitis.  We will get CBC, CMP, lipase, urinalysis, CT scan abdomen pelvis.  We will give IV fluids, IV Zofran and IV Dilaudid and reevaluate.  Per my review and interpretation of labs is no significant anemia, electrolyte abnormality, kidney injury, leukocytosis.  CT scan of the abdomen and pelvis per radiology reports unremarkable.  No bowel obstruction.  No acute findings.  He is feeling much better.  Not sure if this is a viral process or gastritis.  We will start Carafate and Protonix and have him follow-up with GI.  Discharged in good condition.  Understands return precautions.  This chart was dictated using voice recognition software.  Despite best efforts to proofread,  errors can occur which can change the documentation meaning.         Final Clinical Impression(s) / ED Diagnoses Final diagnoses:   Generalized abdominal pain    Rx / DC Orders ED Discharge Orders          Ordered    sucralfate (CARAFATE) 1 g tablet  3 times daily with meals & bedtime        02/17/22 1333    pantoprazole (PROTONIX) 20 MG tablet  Daily        02/17/22 1333    ondansetron (ZOFRAN) 4 MG tablet  Every 6 hours        02/17/22 1333              Lynnae Ludemann, DO 02/17/22 1334

## 2022-02-17 NOTE — ED Notes (Signed)
Discharge paperwork given and verbally understood. 

## 2022-02-17 NOTE — ED Triage Notes (Signed)
Pt arrives to ED with c/o abdominal pain x3 weeks. He reports that today at 0930am it became severe. He reports the pain is umbilical in region.

## 2022-02-17 NOTE — ED Notes (Signed)
RT note: Finger stick obtained for blood Glucose level, RN aware, results downloaded to Epic.

## 2022-04-17 ENCOUNTER — Ambulatory Visit (INDEPENDENT_AMBULATORY_CARE_PROVIDER_SITE_OTHER): Payer: 59 | Admitting: Podiatry

## 2022-04-17 DIAGNOSIS — L989 Disorder of the skin and subcutaneous tissue, unspecified: Secondary | ICD-10-CM | POA: Diagnosis not present

## 2022-04-17 NOTE — Patient Instructions (Signed)
Keep the bandage on for 24 hours. At that time, remove and clean with soap and water. If it hurts or burns before 24 hours go ahead and remove the bandage and wash with soap and water. Keep the area clean. If there is any blistering cover with antibiotic ointment and a bandage. Monitor for any redness, drainage, or other signs of infection. Call the office if any are to occur. If you have any questions, please call the office at [phone].  

## 2022-04-17 NOTE — Progress Notes (Signed)
Subjective:   Patient ID: Albert White, male   DOB: 46 y.o.   MRN: [id number]   HPI Chief Complaint  Patient presents with   [place], PAINFUL WHEN [place], [place] PAIN FROM OUT THE BLUE     46 year old male presents the office with above concerns.  This has been ongoing a few months ago. No injuries, change in activities. No treatment. It hurts with pressure/on it. Sometimes it will hurt when sitting after he has been on his foot all day. No drainage, swelling, opening.   No longer diabetic.    Review of Systems  All other systems reviewed and are negative.  Past Medical History:  Diagnosis Date   Abscess    Allergy    Depression    Diabetes mellitus without complication (HCC)    no problem since gastric bypass   GERD (gastroesophageal reflux disease)    Hidradenitis suppurativa    History of kidney stones    Hyperlipemia    Kidney stones    Morbid obesity ([place])    Vertigo     Past Surgical History:  Procedure Laterality Date   CHOLECYSTECTOMY N/A 06/09/2018   Procedure: LAPAROSCOPIC CHOLECYSTECTOMY WITH   INTRAOPERATIVE CHOLANGIOGRAM,;  Surgeon: Ileana Roup, MD;  Location: [facility];  Service: General;  Laterality: N/A;   CYSTOSCOPY W/ URETERAL STENT PLACEMENT Left 02/17/2020   Procedure: CYSTOSCOPY WITH RETROGRADE PYELOGRAM/URETERAL STENT PLACEMENT;  Surgeon: Robley Fries, MD;  Location: [facility];  Service: Urology;  Laterality: Left;   CYSTOSCOPY WITH RETROGRADE PYELOGRAM, URETEROSCOPY AND STENT PLACEMENT Left 02/21/2020   Procedure: CYSTOSCOPY WITH LEFT RETROGRADE PYELOGRAM, URETEROSCOPY LASER  AND STENT PLACEMENT;  Surgeon: Robley Fries, MD;  Location: [facility];  Service: Urology;  Laterality: Left;   CYSTOSCOPY/URETEROSCOPY/HOLMIUM LASER/STENT PLACEMENT Left 03/07/2020   Procedure: CYSTOSCOPY/RETROGRADE/URETEROSCOPY AND STONE BASKETTING;  Surgeon: Ardis Hughs, MD;  Location: [facility];  Service: Urology;  Laterality: Left;  ONLY NEEDS 60 MIN   EXTRACORPOREAL SHOCK WAVE LITHOTRIPSY Left 02/16/2020   Procedure: EXTRACORPOREAL SHOCK WAVE LITHOTRIPSY (ESWL);  Surgeon: Bjorn Loser, MD;  Location: [facility];  Service: Urology;  Laterality: Left;   GASTRIC BYPASS  11/2016   INCISION AND DRAINAGE ABSCESS Right 12/15/2013   Procedure: INCISION AND DRAINAGE Right Posterior neck ABSCESS;  Surgeon: Gwenyth Ober, MD;  Location: [facility];  Service: General;  Laterality: Right;   INCISION AND DRAINAGE DEEP NECK ABSCESS     Hidradenitis - 3-4 times at [facility] and [facility]   IR CATHETER TUBE CHANGE  02/18/2017   IR RADIOLOGIST EVAL & MGMT  02/11/2017   IR RADIOLOGIST EVAL & MGMT  03/03/2017   IR THORACENTESIS ASP PLEURAL SPACE W/IMG GUIDE  01/23/2017   LAPAROSCOPIC LYSIS OF ADHESIONS N/A 06/09/2018   Procedure: LAPAROSCOPIC LYSIS OF ADHESIONS;  Surgeon: Ileana Roup, MD;  Location: [facility];  Service: General;  Laterality: N/A;     Current Outpatient Medications:    buPROPion (WELLBUTRIN) 75 MG tablet, Take 50 mg by mouth 2 (two) times daily., Disp: , Rfl:    famotidine (PEPCID) 20 MG tablet, Take 1 tablet (20 mg total) by mouth 2 (two) times daily., Disp: 60 tablet, Rfl: 11   HYDROcodone-acetaminophen (NORCO/VICODIN) 5-325 MG tablet, Take 1-2 tablets by mouth every 6 (six) hours as needed for severe pain., Disp: 10 tablet, Rfl: 0   ondansetron (ZOFRAN) 4 MG tablet, Take 1 tablet (4 mg  total) by mouth every 6 (six) hours., Disp: 12 tablet, Rfl: 0   sertraline (ZOLOFT) 50 MG tablet, Take 50 mg by mouth daily., Disp: , Rfl:    tiZANidine (ZANAFLEX) 2 MG tablet, Take 1-2 tablets (2-4 mg total) by mouth every 8 (eight) hours as needed for muscle spasms., Disp: 10 tablet, Rfl: 0   traZODone (DESYREL) 100 MG tablet, Take 200 mg by mouth at bedtime. , Disp: , Rfl:    pantoprazole (PROTONIX) 20 MG tablet, Take 1 tablet (20 mg total) by mouth daily for 14 days.,  Disp: 14 tablet, Rfl: 0   sucralfate (CARAFATE) 1 g tablet, Take 1 tablet (1 g total) by mouth 4 (four) times daily -  with meals and at bedtime for 14 days., Disp: 56 tablet, Rfl: 0  Allergies  Allergen Reactions   Ibuprofen Other (See Comments)    Has bar   Nsaids     affects kidney          Objective:  Physical Exam  General: AAO x3, NAD  Dermatological: Hyperkeratotic lesion noted to right fifth metatarsal base without any underlying ulceration drainage or signs of infection.  No hyperpigmentation.  No ulcerations.  No drainage or pus.  No signs of infection.  Vascular: Dorsalis Pedis artery and Posterior Tibial artery pedal pulses are 2/4 bilateral with immedate capillary fill time. There is no pain with calf compression, swelling, warmth, erythema.   Neruologic: Grossly intact via light touch bilateral.   Musculoskeletal: Prominence of the fifth metatarsal base.  Tenderness directly along the area of the fifth metatarsal base, area the callus no other areas of discomfort.  No pain along the peroneal tendon.  Gait: Unassisted, Nonantalgic.       Assessment:   Skin lesion right foot     Plan:  I sharply debrided lesion without any complications or bleeding.  Cleaned the skin with alcohol at A bar salicylic acid and a bandage.  Postprocedure instructions were discussed.  Monitor for any signs or symptoms of infection.  Discussed moisturizer, offloading.  Discussed shoe modifications avoid pressure.  Vivi Barrack DPM

## 2022-06-25 ENCOUNTER — Encounter (HOSPITAL_BASED_OUTPATIENT_CLINIC_OR_DEPARTMENT_OTHER): Payer: Self-pay

## 2022-06-25 ENCOUNTER — Emergency Department (HOSPITAL_BASED_OUTPATIENT_CLINIC_OR_DEPARTMENT_OTHER): Payer: 59 | Admitting: Radiology

## 2022-06-25 ENCOUNTER — Other Ambulatory Visit: Payer: Self-pay

## 2022-06-25 ENCOUNTER — Emergency Department (HOSPITAL_BASED_OUTPATIENT_CLINIC_OR_DEPARTMENT_OTHER)
Admission: EM | Admit: 2022-06-25 | Discharge: 2022-06-25 | Disposition: A | Discharge status: Home / Self Care | Payer: 59 | Attending: Emergency Medicine | Admitting: Emergency Medicine

## 2022-06-25 ENCOUNTER — Emergency Department (HOSPITAL_BASED_OUTPATIENT_CLINIC_OR_DEPARTMENT_OTHER): Payer: 59

## 2022-06-25 DIAGNOSIS — E119 Type 2 diabetes mellitus without complications: Secondary | ICD-10-CM | POA: Diagnosis not present

## 2022-06-25 DIAGNOSIS — R1084 Generalized abdominal pain: Secondary | ICD-10-CM | POA: Insufficient documentation

## 2022-06-25 DIAGNOSIS — Z20822 Contact with and (suspected) exposure to covid-19: Secondary | ICD-10-CM | POA: Diagnosis not present

## 2022-06-25 DIAGNOSIS — K529 Noninfective gastroenteritis and colitis, unspecified: Secondary | ICD-10-CM

## 2022-06-25 DIAGNOSIS — R112 Nausea with vomiting, unspecified: Secondary | ICD-10-CM | POA: Diagnosis not present

## 2022-06-25 LAB — URINALYSIS, ROUTINE W REFLEX MICROSCOPIC
Bilirubin Urine: NEGATIVE
Glucose, UA: NEGATIVE mg/dL
Hgb urine dipstick: NEGATIVE
Ketones, ur: 15 mg/dL — AB
Leukocytes,Ua: NEGATIVE
Nitrite: NEGATIVE
Protein, ur: NEGATIVE mg/dL
Specific Gravity, Urine: >1.046 — ABNORMAL HIGH (ref 1.005–1.030)
pH: 6.5 (ref 5.0–8.0)

## 2022-06-25 LAB — COMPREHENSIVE METABOLIC PANEL
ALT: 10 U/L (ref 0–44)
AST: 11 U/L — ABNORMAL LOW (ref 15–41)
Albumin: 3.8 g/dL (ref 3.5–5.0)
Alkaline Phosphatase: 60 U/L (ref 38–126)
Anion gap: 11 (ref 5–15)
BUN: 13 mg/dL (ref 6–20)
CO2: 21 mmol/L — ABNORMAL LOW (ref 22–32)
Calcium: 8.8 mg/dL — ABNORMAL LOW (ref 8.9–10.3)
Chloride: 106 mmol/L (ref 98–111)
Creatinine, Ser: 0.85 mg/dL (ref 0.61–1.24)
GFR, Estimated: >60 mL/min (ref >60)
Glucose, Bld: 116 mg/dL — ABNORMAL HIGH (ref 70–99)
Potassium: 3.2 mmol/L — ABNORMAL LOW (ref 3.5–5.1)
Sodium: 138 mmol/L (ref 135–145)
Total Bilirubin: 0.8 mg/dL (ref 0.3–1.2)
Total Protein: 6.2 g/dL — ABNORMAL LOW (ref 6.5–8.1)

## 2022-06-25 LAB — CBC WITH DIFFERENTIAL/PLATELET
Abs Immature Granulocytes: 0.01 10*3/uL (ref 0.00–0.07)
Basophils Absolute: 0 10*3/uL (ref 0.0–0.1)
Basophils Relative: 0 %
Eosinophils Absolute: 0 10*3/uL (ref 0.0–0.5)
Eosinophils Relative: 0 %
HCT: 40.3 % (ref 39.0–52.0)
Hemoglobin: 14.3 g/dL (ref 13.0–17.0)
Immature Granulocytes: 0 %
Lymphocytes Relative: 14 %
Lymphs Abs: 1.3 10*3/uL (ref 0.7–4.0)
MCH: 30.1 pg (ref 26.0–34.0)
MCHC: 35.5 g/dL (ref 30.0–36.0)
MCV: 84.8 fL (ref 80.0–100.0)
Monocytes Absolute: 0.6 10*3/uL (ref 0.1–1.0)
Monocytes Relative: 7 %
Neutro Abs: 6.8 10*3/uL (ref 1.7–7.7)
Neutrophils Relative %: 79 %
Platelets: 166 10*3/uL (ref 150–400)
RBC: 4.75 MIL/uL (ref 4.22–5.81)
RDW: 12.9 % (ref 11.5–15.5)
WBC: 8.8 10*3/uL (ref 4.0–10.5)
nRBC: 0 % (ref 0.0–0.2)

## 2022-06-25 LAB — RESP PANEL BY RT-PCR (RSV, FLU A&B, COVID)  RVPGX2
Influenza A by PCR: NEGATIVE
Influenza B by PCR: NEGATIVE
Resp Syncytial Virus by PCR: NEGATIVE
SARS Coronavirus 2 by RT PCR: NEGATIVE

## 2022-06-25 LAB — LIPASE, BLOOD: Lipase: 37 U/L (ref 11–51)

## 2022-06-25 LAB — TROPONIN I (HIGH SENSITIVITY): Troponin I (High Sensitivity): 8 ng/L (ref <18)

## 2022-06-25 MED ORDER — POTASSIUM CHLORIDE CRYS ER 20 MEQ PO TBCR
40.0000 meq | EXTENDED_RELEASE_TABLET | Freq: Once | ORAL | Status: AC
  Administered 2022-06-25: 40 meq via ORAL
  Filled 2022-06-25: qty 2

## 2022-06-25 MED ORDER — IOHEXOL 300 MG/ML  SOLN
100.0000 mL | Freq: Once | INTRAMUSCULAR | Status: AC | PRN
  Administered 2022-06-25: 85 mL via INTRAVENOUS

## 2022-06-25 MED ORDER — PROMETHAZINE HCL 25 MG/ML IJ SOLN
INTRAMUSCULAR | Status: AC
  Filled 2022-06-25: qty 1

## 2022-06-25 MED ORDER — SODIUM CHLORIDE 0.9 % IV SOLN
12.5000 mg | Freq: Four times a day (QID) | INTRAVENOUS | Status: DC | PRN
  Administered 2022-06-25: 12.5 mg via INTRAVENOUS
  Filled 2022-06-25: qty 0.5

## 2022-06-25 MED ORDER — ONDANSETRON HCL 4 MG/2ML IJ SOLN
4.0000 mg | Freq: Once | INTRAMUSCULAR | Status: AC
  Administered 2022-06-25: 4 mg via INTRAVENOUS
  Filled 2022-06-25: qty 2

## 2022-06-25 MED ORDER — SODIUM CHLORIDE 0.9 % IV BOLUS
1000.0000 mL | Freq: Once | INTRAVENOUS | Status: AC
  Administered 2022-06-25: 1000 mL via INTRAVENOUS

## 2022-06-25 MED ORDER — MORPHINE SULFATE (PF) 4 MG/ML IV SOLN
4.0000 mg | Freq: Once | INTRAVENOUS | Status: AC
  Administered 2022-06-25: 4 mg via INTRAVENOUS
  Filled 2022-06-25: qty 1

## 2022-06-25 MED ORDER — PROMETHAZINE HCL 25 MG PO TABS: 25.0000 mg | ORAL_TABLET | Freq: Four times a day (QID) | ORAL | 0 refills | Status: DC | PRN

## 2022-06-25 NOTE — Discharge Instructions (Addendum)
Begin taking Phenergan as prescribed.  Clear liquids for the next 12 hours, then slowly advance diet to normal as tolerated.  Follow-up with primary doctor if not improving in the next few days, and return to the ER if symptoms significantly worsen or change.

## 2022-06-25 NOTE — ED Notes (Signed)
Pt given discharge instructions and reviewed prescriptions. Opportunities given for questions. Pt verbalizes understanding. IV removed x1.Leanne Chang, RN

## 2022-06-25 NOTE — ED Provider Notes (Signed)
[facility] Provider Note   CSN: [id number] Arrival date & time: 06/25/22  0450     History  Chief Complaint  Patient presents with   Cough   Emesis    HOY FALLERT is a 47 y.o. male.  Patient is a 47 year old male with past medical history of type 2 diabetes, hyperlipidemia, obesity with gastric bypass surgery 5 years ago.  Patient presenting with a 4-day history of nausea, vomiting, and abdominal cramping.  The symptoms have been occurring intermittently.  He was seen by his primary doctor, then urgent care, then sent to the ER at [facility] last night.  He was given IV fluids, but according to the wife "they did not do anything for him".  Patient denies any bloody stool or vomit.  He feels chilled.  He was given a nausea medication, however this does not seem to be helping.  The history is provided by the patient.       Home Medications Prior to Admission medications   Medication Sig Start Date End Date Taking? Authorizing Provider  buPROPion (WELLBUTRIN) 75 MG tablet Take 50 mg by mouth 2 (two) times daily.    [provider]  famotidine (PEPCID) 20 MG tablet Take 1 tablet (20 mg total) by mouth 2 (two) times daily. 04/25/20   Mauri Pole, MD  HYDROcodone-acetaminophen (NORCO/VICODIN) 5-325 MG tablet Take 1-2 tablets by mouth every 6 (six) hours as needed for severe pain. 03/13/20   Tasia Catchings, Amy V, PA-C  ondansetron (ZOFRAN) 4 MG tablet Take 1 tablet (4 mg total) by mouth every 6 (six) hours. 02/17/22   Curatolo, Adam, DO  pantoprazole (PROTONIX) 20 MG tablet Take 1 tablet (20 mg total) by mouth daily for 14 days. 02/17/22 03/03/22  Curatolo, Adam, DO  sertraline (ZOLOFT) 50 MG tablet Take 50 mg by mouth daily.    [provider]  sucralfate (CARAFATE) 1 g tablet Take 1 tablet (1 g total) by mouth 4 (four) times daily -  with meals and at bedtime for 14 days. 02/17/22 03/03/22  Curatolo, Adam, DO  tiZANidine  (ZANAFLEX) 2 MG tablet Take 1-2 tablets (2-4 mg total) by mouth every 8 (eight) hours as needed for muscle spasms. 03/13/20   Tasia Catchings, Amy V, PA-C  traZODone (DESYREL) 100 MG tablet Take 200 mg by mouth at bedtime.     [provider]      Allergies    Ibuprofen and Nsaids    Review of Systems   Review of Systems  All other systems reviewed and are negative.   Physical Exam Updated Vital Signs BP (!) 117/95   Pulse 65   Temp 98.2 F (36.8 C) (Oral)   Resp (!) 22   Wt 111.1 kg   SpO2 100%   BMI 37.25 kg/m  Physical Exam Vitals and nursing note reviewed.  Constitutional:      General: He is not in acute distress.    Appearance: He is well-developed. He is not diaphoretic.  HENT:     Head: Normocephalic and atraumatic.  Cardiovascular:     Rate and Rhythm: Normal rate and regular rhythm.     Heart sounds: No murmur heard.    No friction rub.  Pulmonary:     Effort: Pulmonary effort is normal. No respiratory distress.     Breath sounds: Normal breath sounds. No wheezing or rales.  Abdominal:     General: Bowel sounds are normal. There is no distension.  Palpations: Abdomen is soft.     Tenderness: There is abdominal tenderness. There is no right CVA tenderness, left CVA tenderness, guarding or rebound.     Comments: There is generalized abdominal tenderness.  Musculoskeletal:        General: Normal range of motion.     Cervical back: Normal range of motion and neck supple.  Skin:    General: Skin is warm and dry.  Neurological:     Mental Status: He is alert and oriented to person, place, and time.     Coordination: Coordination normal.     ED Results / Procedures / Treatments   Labs (all labs ordered are listed, but only abnormal results are displayed) Labs Reviewed - No data to display  EKG None  Radiology No results found.  Procedures Procedures  {Document cardiac monitor, telemetry assessment procedure when appropriate:1}  Medications Ordered  in ED Medications  sodium chloride 0.9 % bolus 1,000 mL (has no administration in time range)  ondansetron (ZOFRAN) injection 4 mg (has no administration in time range)  morphine (PF) 4 MG/ML injection 4 mg (has no administration in time range)    ED Course/ Medical Decision Making/ A&P   {   Click here for ABCD2, HEART and other calculatorsREFRESH Note before signing :1}                          Medical Decision Making Amount and/or Complexity of Data Reviewed Labs: ordered. Radiology: ordered.  Risk Prescription drug management.   ***  {Document critical care time when appropriate:1} {Document review of labs and clinical decision tools ie heart score, Chads2Vasc2 etc:1}  {Document your independent review of radiology images, and any outside records:1} {Document your discussion with family members, caretakers, and with consultants:1} {Document social determinants of health affecting pt's care:1} {Document your decision making why or why not admission, treatments were needed:1} Final Clinical Impression(s) / ED Diagnoses Final diagnoses:  None    Rx / DC Orders ED Discharge Orders     None

## 2022-06-25 NOTE — ED Triage Notes (Signed)
Pt in with coughing/emesis since Saturday. Pt was seen at [facility] yesterday for these symptoms, tested negative for covid/flu and was given 2LNS. Felt better after d/c, then woke up at 0200 coughing and dry heaving. Poor intake today, shivering. States he has some generalized abdominal pain. Hx of Roux-en-Y gastric bypass at [facility] 5 years ago

## 2022-06-25 NOTE — ED Provider Notes (Signed)
Care assumed from Dr. Stark Jock.  Patient has a nausea, vomiting, abdominal cramping and dry heaving for the past several days.  Recent workup at outside hospital was reassuring.  Pending CT abdomen and pelvis  CT shows: IMPRESSION: 1. Fluid in the proximal large bowel, and some small bowel loops, raising the possibility of diarrhea and/or enteritis. But no other acute or inflammatory process in the abdomen or pelvis. Stable Roux-en-Y type gastric bypass.  Normal appendix.  ED ECG REPORT   Date: 06/25/2022  Rate: 71  Rhythm: normal sinus rhythm  QRS Axis: normal  Intervals: normal  ST/T Wave abnormalities: normal  Conduction Disutrbances:none  Narrative Interpretation:   Old EKG Reviewed: unchanged  I have personally reviewed the EKG tracing and agree with the computerized printout as noted.  On recheck, patient is tolerating p.o.  Urinalysis negative for infection.  COVID and flu swabs are negative.  Workup is reassuring.  Abdomen soft without peritoneal signs.  Will treat supportively for likely viral gastroenteritis/enteritis.  Unable to give stool sample here.  He has nausea medication at home.  Follow-up with PCP.  Return precautions discussed.    Albert Essex, MD 06/25/22 [phone]

## 2022-06-26 ENCOUNTER — Telehealth: Payer: Self-pay

## 2022-06-26 ENCOUNTER — Ambulatory Visit
Admission: EM | Admit: 2022-06-26 | Discharge: 2022-06-26 | Disposition: A | Discharge status: Home / Self Care | Payer: 59 | Attending: Physician Assistant | Admitting: Physician Assistant

## 2022-06-26 DIAGNOSIS — R220 Localized swelling, mass and lump, head: Secondary | ICD-10-CM | POA: Diagnosis not present

## 2022-06-26 DIAGNOSIS — T50905A Adverse effect of unspecified drugs, medicaments and biological substances, initial encounter: Secondary | ICD-10-CM | POA: Diagnosis not present

## 2022-06-26 MED ORDER — PREDNISONE 20 MG PO TABS: 40.0000 mg | ORAL_TABLET | Freq: Every day | ORAL | 0 refills | Status: DC

## 2022-06-26 MED ORDER — PREDNISONE 20 MG PO TABS: 40.0000 mg | ORAL_TABLET | Freq: Every day | ORAL | 0 refills | Status: AC

## 2022-06-26 NOTE — ED Triage Notes (Signed)
Pt presents with swollen tongue & mouth sores after taking a medication prescribed by [facility] for some vomiting that he had a few days ago.

## 2022-06-26 NOTE — ED Provider Notes (Signed)
[facility]    CSN: [id number] Arrival date & time: 06/26/22  1421      History   Chief Complaint Chief Complaint  Patient presents with   Allergic Reaction    HPI Albert White is a 47 y.o. male.   Patient here today with wife for evaluation of swelling of tongue that started after he was given haldol via IV as well as two oral doses for nausea and vomiting at [facility] 2 days ago. He was evaluated in the ED yesterday where workup was relatively benign and it appeared he had likely gastroenteritis. There was no direct mention of haldol or tongue swelling in that documentation. Patient denies any trouble swallowing or breathing. He has not taken any additional medication for symptoms. Wife repeats today what was noted in chart from ED that prior providers have "done nothing" for patient.   The history is provided by the patient and the spouse.  Allergic Reaction Presenting symptoms: no difficulty swallowing     Past Medical History:  Diagnosis Date   Abscess    Allergy    Depression    Diabetes mellitus without complication ([place])    no problem since gastric bypass   GERD (gastroesophageal reflux disease)    Hidradenitis suppurativa    History of kidney stones    Hyperlipemia    Kidney stones    Morbid obesity ([place])    Vertigo     Patient Active Problem List   Diagnosis Date Noted   Renal colic 75/02/2584   Choledocholithiasis 06/06/2018   Polysubstance (excluding opioids) dependence, daily use ([place]) 06/24/2017   MDD (major depressive disorder), recurrent severe, without psychosis ([place]) 06/22/2017   Major depressive disorder, single episode, severe ([place]) 06/21/2017   Polysubstance abuse ([place]) 06/21/2017   Pain management    Hypomagnesemia    Acute pulmonary edema (HCC)    Peritonitis (HCC)    Gastrointestinal anastomotic leak    Pleural effusion on left    Postprocedural intraabdominal abscess 01/15/2017   Increased band cell count  01/06/2017   Generalized edema 01/06/2017   Former smoker 08/08/2016   Morbid obesity with BMI of 50.0-59.9, adult ([place]) 06/25/2016   OSA (obstructive sleep apnea) 06/25/2016   Vitamin D deficiency 06/25/2016   Type 2 diabetes mellitus without complication, without long-term current use of insulin ([place]) 02/29/2016   Mixed hyperlipidemia 02/29/2016   Cellulitis and abscess of neck 02/29/2016   Cellulitis 12/14/2013   Morbid obesity ([place]) 12/14/2013   Neck abscess 12/14/2013   Hidradenitis 08/09/2012    Past Surgical History:  Procedure Laterality Date   CHOLECYSTECTOMY N/A 06/09/2018   Procedure: LAPAROSCOPIC CHOLECYSTECTOMY WITH   INTRAOPERATIVE CHOLANGIOGRAM,;  Surgeon: Ileana Roup, MD;  Location: [place];  Service: General;  Laterality: N/A;   CYSTOSCOPY W/ URETERAL STENT PLACEMENT Left 02/17/2020   Procedure: CYSTOSCOPY WITH RETROGRADE PYELOGRAM/URETERAL STENT PLACEMENT;  Surgeon: Robley Fries, MD;  Location: [place];  Service: Urology;  Laterality: Left;   CYSTOSCOPY WITH RETROGRADE PYELOGRAM, URETEROSCOPY AND STENT PLACEMENT Left 02/21/2020   Procedure: CYSTOSCOPY WITH LEFT RETROGRADE PYELOGRAM, URETEROSCOPY LASER  AND STENT PLACEMENT;  Surgeon: Robley Fries, MD;  Location[facility];  Service: Urology;  Laterality: Left;   CYSTOSCOPY/URETEROSCOPY/HOLMIUM LASER/STENT PLACEMENT Left 03/07/2020   Procedure: CYSTOSCOPY/RETROGRADE/URETEROSCOPY AND STONE BASKETTING;  Surgeon: Ardis Hughs, MD;  Location: [facility];  Service: Urology;  Laterality: Left;  ONLY NEEDS 60 MIN   EXTRACORPOREAL SHOCK WAVE LITHOTRIPSY Left 02/16/2020  Procedure: EXTRACORPOREAL SHOCK WAVE LITHOTRIPSY (ESWL);  Surgeon: Alfredo Martinez, MD;  Location: [facility];  Service: Urology;  Laterality: Left;   GASTRIC BYPASS  11/2016   INCISION AND DRAINAGE ABSCESS Right 12/15/2013   Procedure: INCISION AND DRAINAGE Right Posterior neck ABSCESS;   Surgeon: Cherylynn Ridges, MD;  [facility];  Service: General;  Laterality: Right;   INCISION AND DRAINAGE DEEP NECK ABSCESS     Hidradenitis - 3-4 times at [facility] and [place]   IR CATHETER TUBE CHANGE  02/18/2017   IR RADIOLOGIST EVAL & MGMT  02/11/2017   IR RADIOLOGIST EVAL & MGMT  03/03/2017   IR THORACENTESIS ASP PLEURAL SPACE W/IMG GUIDE  01/23/2017   LAPAROSCOPIC LYSIS OF ADHESIONS N/A 06/09/2018   Procedure: LAPAROSCOPIC LYSIS OF ADHESIONS;  Surgeon: Andria Meuse, MD;  [facility];  Service: General;  Laterality: N/A;       Home Medications    Prior to Admission medications   Medication Sig Start Date End Date Taking? Authorizing Provider  buPROPion (WELLBUTRIN) 75 MG tablet Take 50 mg by mouth 2 (two) times daily.    [provider]  famotidine (PEPCID) 20 MG tablet Take 1 tablet (20 mg total) by mouth 2 (two) times daily. 04/25/20   Napoleon Form, MD  HYDROcodone-acetaminophen (NORCO/VICODIN) 5-325 MG tablet Take 1-2 tablets by mouth every 6 (six) hours as needed for severe pain. 03/13/20   Cathie Hoops, Amy V, PA-C  ondansetron (ZOFRAN) 4 MG tablet Take 1 tablet (4 mg total) by mouth every 6 (six) hours. 02/17/22   Curatolo, Adam, DO  pantoprazole (PROTONIX) 20 MG tablet Take 1 tablet (20 mg total) by mouth daily for 14 days. 02/17/22 03/03/22  Curatolo, Adam, DO  predniSONE (DELTASONE) 20 MG tablet Take 2 tablets (40 mg total) by mouth daily with breakfast for 5 days. 06/26/22 07/01/22  Tomi Bamberger, PA-C  promethazine (PHENERGAN) 25 MG tablet Take 1 tablet (25 mg total) by mouth every 6 (six) hours as needed for nausea. 06/25/22   Geoffery Lyons, MD  sertraline (ZOLOFT) 50 MG tablet Take 50 mg by mouth daily.    [provider]  sucralfate (CARAFATE) 1 g tablet Take 1 tablet (1 g total) by mouth 4 (four) times daily -  with meals and at bedtime for 14 days. 02/17/22 03/03/22  Curatolo, Adam, DO  tiZANidine (ZANAFLEX) 2 MG tablet Take 1-2 tablets (2-4 mg total) by  mouth every 8 (eight) hours as needed for muscle spasms. 03/13/20   Cathie Hoops, Amy V, PA-C  traZODone (DESYREL) 100 MG tablet Take 200 mg by mouth at bedtime.     [provider]    Family History Family History  Problem Relation Age of Onset   Arthritis Mother    Hypertension Mother    Cancer Paternal Grandmother        type [place]   Cancer Paternal Aunt        type [place]   Cancer Paternal Uncle        type [place]   Colon cancer Neg Hx    Esophageal cancer Neg Hx    Rectal cancer Neg Hx    Stomach cancer Neg Hx     Social History Social History   Tobacco Use   Smoking status: Former    Packs/day: 1.00    Types: Cigarettes    Quit date: 11/27/2013    Years since quitting: 8.5   Smokeless tobacco: Never  Vaping Use   Vaping  Use: Never used  Substance Use Topics   Alcohol use: No   Drug use: Not Currently     Allergies   Haldol [haloperidol], Ibuprofen, and Nsaids   Review of Systems Review of Systems  Constitutional:  Negative for chills and fever.  HENT:  Negative for congestion, facial swelling and trouble swallowing.   Eyes:  Negative for discharge and redness.  Respiratory:  Negative for shortness of breath.   Gastrointestinal:  Positive for vomiting.     Physical Exam Triage Vital Signs ED Triage Vitals  Enc Vitals Group     BP 06/26/22 1430 132/87     Pulse Rate 06/26/22 1430 76     Resp 06/26/22 1430 20     Temp 06/26/22 1430 98.6 F (37 C)     Temp Source 06/26/22 1430 Oral     SpO2 06/26/22 1430 96 %     Weight --      Height --      Head Circumference --      Peak Flow --      Pain Score 06/26/22 1431 3     Pain Loc --      Pain Edu? --      Excl. in [place]? --    No data found.  Updated Vital Signs BP 132/87 (BP Location: Left Arm)   Pulse 76   Temp 98.6 F (37 C) (Oral)   Resp 20   SpO2 96%   Visual Acuity Right Eye Distance:   Left Eye Distance:   Bilateral Distance:    Right Eye Near:   Left Eye Near:    Bilateral  Near:     Physical Exam Vitals and nursing note reviewed.  Constitutional:      General: He is not in acute distress.    Appearance: Normal appearance. He is not ill-appearing.  HENT:     Head: Normocephalic and atraumatic.     Nose: Nose normal. No congestion or rhinorrhea.     Mouth/Throat:     Mouth: Mucous membranes are moist.     Pharynx: Oropharynx is clear.     Comments: Minimal diffuse swelling to tongue, no erythema, no oral mucosal lesions noted Eyes:     Conjunctiva/sclera: Conjunctivae normal.  Cardiovascular:     Rate and Rhythm: Normal rate and regular rhythm.     Heart sounds: Normal heart sounds.  Pulmonary:     Effort: Pulmonary effort is normal. No respiratory distress.     Breath sounds: Normal breath sounds. No stridor. No wheezing, rhonchi or rales.  Neurological:     Mental Status: He is alert.  Psychiatric:        Mood and Affect: Mood normal.        Behavior: Behavior normal.      UC Treatments / Results  Labs (all labs ordered are listed, but only abnormal results are displayed) Labs Reviewed - No data to display  EKG   Radiology CT ABDOMEN PELVIS W CONTRAST  Result Date: 06/25/2022 CLINICAL DATA:  47 year old male with abdominal pain, coughing, vomiting. EXAM: CT ABDOMEN AND PELVIS WITH CONTRAST TECHNIQUE: Multidetector CT imaging of the abdomen and pelvis was performed using the standard protocol following bolus administration of intravenous contrast. RADIATION DOSE REDUCTION: This exam was performed according to the departmental dose-optimization program which includes automated exposure control, adjustment of the mA and/or kV according to patient size and/or use of iterative reconstruction technique. CONTRAST:  17mL OMNIPAQUE IOHEXOL 300 MG/ML  SOLN COMPARISON:  CT Abdomen and Pelvis [facility] yesterday. FINDINGS: Lower chest: Negative.  No pericardial or pleural effusion. Hepatobiliary: Absent gallbladder.  Negative liver. Pancreas:  Partial fatty atrophy, otherwise negative. Spleen: Stable, negative. Adrenals/Urinary Tract: Normal adrenal glands. Symmetric renal enhancement. No delayed renal images today. Small posterior left renal midpole cyst has been present since at least 2018 and appears stable (no follow-up imaging recommended). No hydronephrosis or evidence of renal inflammation. Decompressed ureters. Excreted IV contrast within the urinary bladder. Stomach/Bowel: Redundant sigmoid colon at the pelvic inlet with diverticulosis, but no active inflammation. Mild diverticulosis in the descending colon. Gas and fluid containing right colon and hepatic flexure. Somewhat redundant right colon with cecum on a lax mesentery. No large bowel wall thickening identified. Decompressed terminal ileum. Diminutive, normal appendix on coronal image 74. Superimposed sequelae of Roux-en-Y type gastric bypass. Stable postoperative changes to the stomach and small bowel. No dilated loops. Although there is some fluid in nondilated mid and distal small bowel. No free air or free fluid. Duodenum is decompressed. Vascular/Lymphatic: Aortoiliac calcified atherosclerosis. Suboptimal intravascular contrast timing but the major arterial and portal venous structures appear patent. No lymphadenopathy. Left inguinal surgical clip. Reproductive: Negative. Other: No pelvis free fluid. Musculoskeletal: Exaggerated lumbar lordosis. Chronic disc and endplate degeneration. No acute osseous abnormality identified. IMPRESSION: 1. Fluid in the proximal large bowel, and some small bowel loops, raising the possibility of diarrhea and/or enteritis. But no other acute or inflammatory process in the abdomen or pelvis. Stable Roux-en-Y type gastric bypass.  Normal appendix. 2.  Aortic Atherosclerosis (ICD10-I70.0). Electronically Signed   By: Genevie Ann M.D.   On: 06/25/2022 07:28   DG Chest 2 View  Result Date: 06/25/2022 CLINICAL DATA:  Cough and vomiting EXAM: CHEST - 2 VIEW  COMPARISON:  Chest x-ray October 20, 2018 FINDINGS: The heart size and mediastinal contours are within normal limits. Both lungs are clear. The visualized skeletal structures are unremarkable. IMPRESSION: 1. No acute abnormalities to explain the patient's symptoms. 2. Anterior wedging of lower thoracic vertebral bodies is stable. Electronically Signed   By: Dorise Bullion III M.D.   On: 06/25/2022 07:03    Procedures Procedures (including critical care time)  Medications Ordered in UC Medications - No data to display  Initial Impression / Assessment and Plan / UC Course  I have reviewed the triage vital signs and the nursing notes.  Pertinent labs & imaging results that were available during my care of the patient were reviewed by me and considered in my medical decision making (see chart for details).    Recommended discontinuation of haldol and this was added to med list. Will trial steroid burst for suspected med reaction and recommended further evaluation in the ED with no improvement or worsening.  Final Clinical Impressions(s) / UC Diagnoses   Final diagnoses:  Medication reaction, initial encounter   Discharge Instructions   None    ED Prescriptions     Medication Sig Dispense Auth. Provider   predniSONE (DELTASONE) 20 MG tablet Take 2 tablets (40 mg total) by mouth daily with breakfast for 5 days. 10 tablet Francene Finders, PA-C      PDMP not reviewed this encounter.   Francene Finders, PA-C 06/26/22 [phone]

## 2022-11-13 ENCOUNTER — Ambulatory Visit: Payer: 59 | Admitting: Nurse Practitioner

## 2022-12-15 ENCOUNTER — Ambulatory Visit (HOSPITAL_COMMUNITY)
Admission: EM | Admit: 2022-12-15 | Discharge: 2022-12-15 | Disposition: A | Discharge status: Home / Self Care | Payer: 59

## 2022-12-15 ENCOUNTER — Encounter (HOSPITAL_COMMUNITY): Payer: Self-pay

## 2022-12-15 DIAGNOSIS — K529 Noninfective gastroenteritis and colitis, unspecified: Secondary | ICD-10-CM | POA: Diagnosis not present

## 2022-12-15 MED ORDER — ALUMINUM & MAGNESIUM HYDROXIDE 200-200 MG/5ML PO SUSP
30.0000 mL | Freq: Once | ORAL | Status: AC
  Administered 2022-12-15: 30 mL via ORAL

## 2022-12-15 MED ORDER — ALUM & MAG HYDROXIDE-SIMETH 200-200-20 MG/5ML PO SUSP
ORAL | Status: AC
  Filled 2022-12-15: qty 30

## 2022-12-15 MED ORDER — SUCRALFATE 1 G PO TABS: 1.0000 g | ORAL_TABLET | Freq: Three times a day (TID) | ORAL | 0 refills | Status: AC

## 2022-12-15 MED ORDER — PROMETHAZINE HCL 25 MG PO TABS: 25.0000 mg | ORAL_TABLET | Freq: Four times a day (QID) | ORAL | 0 refills | Status: AC | PRN

## 2022-12-15 MED ORDER — PANTOPRAZOLE SODIUM 20 MG PO TBEC: 20.0000 mg | DELAYED_RELEASE_TABLET | Freq: Every day | ORAL | 0 refills | Status: AC

## 2022-12-15 MED ORDER — PROMETHAZINE HCL 25 MG PO TABS: 25.0000 mg | ORAL_TABLET | Freq: Four times a day (QID) | ORAL | 0 refills | Status: DC | PRN

## 2022-12-15 MED ORDER — ALUMINUM-MAGNESIUM-SIMETHICONE 200-200-20 MG/5ML PO SUSP: 30.0000 mL | Freq: Three times a day (TID) | ORAL | 0 refills | Status: AC

## 2022-12-15 NOTE — ED Provider Notes (Signed)
[facility]    CSN: [id number] Arrival date & time: 12/15/22  0801      History   Chief Complaint Chief Complaint  Patient presents with   Abdominal Pain    HPI Albert White is a 47 y.o. male.   Patient presents for evaluation of abdominal pain, nausea present for 3 days abdominal pain is constant, fluctuating intensity, present to, described as a twisting sensation with burning.  Nausea occurs intermittently, when present unable to tolerate food or liquids.  Last occurrence of vomiting this morning approximately at 4 AM, emesis described as clear to yellow 1 day ago, denies diarrhea or constipation.  Has been experiencing chills but denies presence of fever or URI symptoms.  No known sick contact.  No other member of household has similar symptoms.  Has had similar symptoms in the past, typically occur every few months.  History of GERD, does not take medicine regularly.  History of a gastric bypass.     Past Medical History:  Diagnosis Date   Abscess    Allergy    Depression    Diabetes mellitus without complication (HCC)    no problem since gastric bypass   GERD (gastroesophageal reflux disease)    Hidradenitis suppurativa    History of kidney stones    Hyperlipemia    Kidney stones    Morbid obesity (HCC)    Vertigo     Patient Active Problem List   Diagnosis Date Noted   Renal colic 02/20/2020   Choledocholithiasis 06/06/2018   Polysubstance (excluding opioids) dependence, daily use (HCC) 06/24/2017   MDD (major depressive disorder), recurrent severe, without psychosis (HCC) 06/22/2017   Major depressive disorder, single episode, severe (HCC) 06/21/2017   Polysubstance abuse (HCC) 06/21/2017   Pain management    Hypomagnesemia    Acute pulmonary edema (HCC)    Peritonitis (HCC)    Gastrointestinal anastomotic leak    Pleural effusion on left    Postprocedural intraabdominal abscess 01/15/2017   Increased band cell count 01/06/2017    Generalized edema 01/06/2017   Former smoker 08/08/2016   Morbid obesity with BMI of 50.0-59.9, adult (HCC) 06/25/2016   OSA (obstructive sleep apnea) 06/25/2016   Vitamin D deficiency 06/25/2016   Type 2 diabetes mellitus without complication, without long-term current use of insulin (HCC) 02/29/2016   Mixed hyperlipidemia 02/29/2016   Cellulitis and abscess of neck 02/29/2016   Cellulitis 12/14/2013   Morbid obesity (HCC) 12/14/2013   Neck abscess 12/14/2013   Hidradenitis 08/09/2012    Past Surgical History:  Procedure Laterality Date   CHOLECYSTECTOMY N/A 06/09/2018   Procedure: LAPAROSCOPIC CHOLECYSTECTOMY WITH   INTRAOPERATIVE CHOLANGIOGRAM,;  Surgeon: Andria Meuse, MD;  [facility];  Service: General;  Laterality: N/A;   CYSTOSCOPY W/ URETERAL STENT PLACEMENT Left 02/17/2020   Procedure: CYSTOSCOPY WITH RETROGRADE PYELOGRAM/URETERAL STENT PLACEMENT;  Surgeon: Noel Christmas, MD;  Location: [facility] [place];  Service: Urology;  Laterality: Left;   CYSTOSCOPY WITH RETROGRADE PYELOGRAM, URETEROSCOPY AND STENT PLACEMENT Left 02/21/2020   Procedure: CYSTOSCOPY WITH LEFT RETROGRADE PYELOGRAM, URETEROSCOPY LASER  AND STENT PLACEMENT;  Surgeon: Noel Christmas, MD;  [facility];  Service: Urology;  Laterality: Left;   CYSTOSCOPY/URETEROSCOPY/HOLMIUM LASER/STENT PLACEMENT Left 03/07/2020   Procedure: CYSTOSCOPY/RETROGRADE/URETEROSCOPY AND STONE BASKETTING;  Surgeon: Crist Fat, MD;  Location: [facility];  Service: Urology;  Laterality: Left;  ONLY NEEDS 60 MIN   EXTRACORPOREAL SHOCK WAVE LITHOTRIPSY Left 02/16/2020   Procedure: EXTRACORPOREAL SHOCK  WAVE LITHOTRIPSY (ESWL);  Surgeon: Alfredo Martinez, MD;  Location: [facility];  Service: Urology;  Laterality: Left;   GASTRIC BYPASS  11/2016   INCISION AND DRAINAGE ABSCESS Right 12/15/2013   Procedure: INCISION AND DRAINAGE Right Posterior neck ABSCESS;  Surgeon: Cherylynn Ridges, MD;  [facility];  Service: General;  Laterality: Right;   INCISION AND DRAINAGE DEEP NECK ABSCESS     Hidradenitis - 3-4 times at [facility] and [place]   IR CATHETER TUBE CHANGE  02/18/2017   IR RADIOLOGIST EVAL & MGMT  02/11/2017   IR RADIOLOGIST EVAL & MGMT  03/03/2017   IR THORACENTESIS ASP PLEURAL SPACE W/IMG GUIDE  01/23/2017   LAPAROSCOPIC LYSIS OF ADHESIONS N/A 06/09/2018   Procedure: LAPAROSCOPIC LYSIS OF ADHESIONS;  Surgeon: Andria Meuse, MD;  [facility];  Service: General;  Laterality: N/A;       Home Medications    Prior to Admission medications   Medication Sig Start Date End Date Taking? Authorizing Provider  pantoprazole (PROTONIX) 20 MG tablet Take 1 tablet (20 mg total) by mouth daily for 14 days. 02/17/22 12/15/22 Yes Curatolo, Adam, DO  zolpidem (AMBIEN) 5 MG tablet Take 5 mg by mouth at bedtime as needed for sleep.   Yes [provider]  buPROPion (WELLBUTRIN) 75 MG tablet Take 50 mg by mouth 2 (two) times daily.    [provider]  famotidine (PEPCID) 20 MG tablet Take 1 tablet (20 mg total) by mouth 2 (two) times daily. 04/25/20   Napoleon Form, MD  HYDROcodone-acetaminophen (NORCO/VICODIN) 5-325 MG tablet Take 1-2 tablets by mouth every 6 (six) hours as needed for severe pain. 03/13/20   Cathie Hoops, Amy V, PA-C  ondansetron (ZOFRAN) 4 MG tablet Take 1 tablet (4 mg total) by mouth every 6 (six) hours. 02/17/22   Curatolo, Adam, DO  promethazine (PHENERGAN) 25 MG tablet Take 1 tablet (25 mg total) by mouth every 6 (six) hours as needed for nausea. 06/25/22   Geoffery Lyons, MD  sertraline (ZOLOFT) 50 MG tablet Take 50 mg by mouth daily.    [provider]  sucralfate (CARAFATE) 1 g tablet Take 1 tablet (1 g total) by mouth 4 (four) times daily -  with meals and at bedtime for 14 days. 02/17/22 03/03/22  Curatolo, Adam, DO  tiZANidine (ZANAFLEX) 2 MG tablet Take 1-2 tablets (2-4 mg total) by mouth every 8 (eight) hours as needed for muscle  spasms. 03/13/20   Cathie Hoops, Amy V, PA-C  traZODone (DESYREL) 100 MG tablet Take 200 mg by mouth at bedtime.     [provider]    Family History Family History  Problem Relation Age of Onset   Arthritis Mother    Hypertension Mother    Cancer Paternal Grandmother        type unkb[place]   Cancer Paternal Aunt        type un[place]own   Cancer Paternal Uncle        type un[place]own   Colon cancer Neg Hx    Esophageal cancer Neg Hx    Rectal cancer Neg Hx    Stomach cancer Neg Hx     Social History Social History   Tobacco Use   Smoking status: Former    Current packs/day: 0.00    Types: Cigarettes    Quit date: 11/27/2013    Years since quitting: 9.0   Smokeless tobacco: Never  Vaping Use   Vaping status: Never Used  Substance Use  Topics   Alcohol use: No   Drug use: Not Currently     Allergies   Haldol [haloperidol], Ibuprofen, and Nsaids   Review of Systems Review of Systems  Constitutional: Negative.   HENT: Negative.    Respiratory: Negative.    Cardiovascular: Negative.   Gastrointestinal:  Positive for abdominal pain, nausea and vomiting. Negative for abdominal distention, anal bleeding, blood in stool, constipation, diarrhea and rectal pain.     Physical Exam Triage Vital Signs ED Triage Vitals  Encounter Vitals Group     BP 12/15/22 0818 (!) 134/90     Systolic BP Percentile --      Diastolic BP Percentile --      Pulse Rate 12/15/22 0818 86     Resp 12/15/22 0818 16     Temp 12/15/22 0818 97.6 F (36.4 C)     Temp Source 12/15/22 0818 Oral     SpO2 12/15/22 0818 97 %     Weight 12/15/22 0817 240 lb (108.9 kg)     Height 12/15/22 0817 5\' 9"  (1.753 m)     Head Circumference --      Peak Flow --      Pain Score 12/15/22 0817 7     Pain Loc --      Pain Education --      Exclude from Growth Chart --    No data found.  Updated Vital Signs BP (!) 134/90 (BP Location: Right Arm)   Pulse 86   Temp 97.6 F (36.4 C) (Oral)   Resp 16   Ht 5\' 9"   (1.753 m)   Wt 240 lb (108.9 kg)   SpO2 97%   BMI 35.44 kg/m   Visual Acuity Right Eye Distance:   Left Eye Distance:   Bilateral Distance:    Right Eye Near:   Left Eye Near:    Bilateral Near:     Physical Exam Constitutional:      Appearance: He is well-developed.  Pulmonary:     Effort: Pulmonary effort is normal.  Abdominal:     General: Abdomen is flat. Bowel sounds are normal.     Palpations: Abdomen is soft.     Tenderness: There is abdominal tenderness in the right lower quadrant and left lower quadrant.  Neurological:     General: No focal deficit present.     Mental Status: He is alert and oriented to person, place, and time.      UC Treatments / Results  Labs (all labs ordered are listed, but only abnormal results are displayed) Labs Reviewed - No data to display  EKG   Radiology No results found.  Procedures Procedures (including critical care time)  Medications Ordered in UC Medications - No data to display  Initial Impression / Assessment and Plan / UC Course  I have reviewed the triage vital signs and the nursing notes.  Pertinent labs & imaging results that were available during my care of the patient were reviewed by me and considered in my medical decision making (see chart for details).  Gastroenteritis  Vital signs are stable and while visibly uncomfortable patient is in no signs of distress nor toxic, tenderness is noted to the lower abdomen, symptoms are consistent with prior presentation of enteritis, given Maalox in office, prescribed Protonix, Maalox, Carafate and promethazine for outpatient use, recommended increase fluid intake with bland diet, may use Tylenol for pain, advised follow-up with gastrointestinal for reevaluation if symptoms continue to persist or recur, for worsening  symptoms to go to the nearest emergency department Final Clinical Impressions(s) / UC Diagnoses   Final diagnoses:  None   Discharge Instructions    None    ED Prescriptions   None    PDMP not reviewed this encounter.   Valinda Hoar, [place] 12/15/22 [phone]

## 2022-12-15 NOTE — Discharge Instructions (Addendum)
Symptoms today are consistent with presentation in the past when you had gastroenteritis which means the stomach lining becomes inflamed due to increased stomach acid, treatment focuses on reducing the stomach acid and protecting the lining to prevent ulcerations from forming  Take your Protonix every morning for 14 days, symptoms are still present at that time you may continue usage  You have been given a dose of Maalox here today in the office, you may use every 6 hours as needed, ideally take at least 30 minutes to an hour before eating to help minimize further irritation  You may use Carafate every 6 hours to further help calm your symptoms  You may take Tylenol every 6 hours as needed for pain  You may use promethazine every 6 hours as needed for pain, be mindful  this will make you feel drowsy  Until you are able to eat the normal amount of food, increase your fluid intake to maintain your hydration  Until your symptoms resolve please eat a bland diet, inside your packet is a list of foods to avoid as they are known triggers that flare increased acid  May return to urgent care as needed for reevaluation  May also follow-up with gastrointestinal which is the specialist, information is listed on front page if your symptoms continue to recur

## 2022-12-15 NOTE — ED Triage Notes (Signed)
Patient here today with c/o a burning and cramping mid abd pain with N&V X 3 days. Patient states that he has been up all night vomiting. 2 and 3 months ago had similar symptoms. Bariatrics surgery 6 years ago and and has gall bladder removed. He is taking Protonix but does not take it regularly.

## 2023-04-06 ENCOUNTER — Ambulatory Visit (INDEPENDENT_AMBULATORY_CARE_PROVIDER_SITE_OTHER): Payer: 59

## 2023-04-06 ENCOUNTER — Ambulatory Visit (INDEPENDENT_AMBULATORY_CARE_PROVIDER_SITE_OTHER): Payer: 59 | Admitting: Podiatry

## 2023-04-06 DIAGNOSIS — M795 Residual foreign body in soft tissue: Secondary | ICD-10-CM | POA: Diagnosis not present

## 2023-04-06 DIAGNOSIS — M79671 Pain in right foot: Secondary | ICD-10-CM

## 2023-04-06 DIAGNOSIS — L989 Disorder of the skin and subcutaneous tissue, unspecified: Secondary | ICD-10-CM | POA: Diagnosis not present

## 2023-04-11 NOTE — Progress Notes (Signed)
Subjective: No chief complaint on file.  47 year old male presents the office today for concerns of continued, recurrent pain along the area of a skin lesion on the right foot.  After the last appointment last year he states it helped for couple of weeks but the symptoms came back.  Does not report any recent injuries, stepping any foreign objects, swelling or redness or any other concerns.  Objective: AAO x3, NAD DP/PT pulses palpable bilaterally, CRT less than 3 seconds On the lateral aspect of the right foot just proximal to the fifth metatarsal base area there is a annular hyperkeratotic lesion.  Upon debridement there is no underlying ulceration, drainage or any evidence of foreign body.  There is tenderness to palpation of this lesion.  There is no other areas discomfort at this time. No pain with calf compression, swelling, warmth, erythema  Assessment: Skin lesion right foot  Plan: -All treatment options discussed with the patient including all alternatives, risks, complications.  -X-rays were obtained reviewed.  Multiple views were obtained.  No evidence of calcification or foreign body. -Given the continued pain we discussed the conservative as well as surgical options.  He wants to consider and proceed with excision of the lesion.  I discussed with him the procedure as well as postoperative course.  We are planning doing this in the office under local anesthesia next week. -Patient encouraged to call the office with any questions, concerns, change in symptoms.   Vivi Barrack DPM

## 2023-04-14 ENCOUNTER — Encounter: Payer: Self-pay | Admitting: Podiatry

## 2023-04-14 ENCOUNTER — Ambulatory Visit (INDEPENDENT_AMBULATORY_CARE_PROVIDER_SITE_OTHER): Payer: 59 | Admitting: Podiatry

## 2023-04-14 DIAGNOSIS — B351 Tinea unguium: Secondary | ICD-10-CM

## 2023-04-14 DIAGNOSIS — Z79899 Other long term (current) drug therapy: Secondary | ICD-10-CM | POA: Diagnosis not present

## 2023-04-14 DIAGNOSIS — L989 Disorder of the skin and subcutaneous tissue, unspecified: Secondary | ICD-10-CM | POA: Diagnosis not present

## 2023-04-14 MED ORDER — HYDROCODONE-ACETAMINOPHEN 5-325 MG PO TABS: 1.0000 | ORAL_TABLET | Freq: Four times a day (QID) | ORAL | 0 refills | Status: AC | PRN

## 2023-04-14 MED ORDER — CEPHALEXIN 500 MG PO CAPS: 500.0000 mg | ORAL_CAPSULE | Freq: Three times a day (TID) | ORAL | 0 refills | Status: AC

## 2023-04-14 NOTE — Patient Instructions (Signed)
In 3 days you can remove the bandage. Apply a small amount of antibiotic ointment and then re bandage the foot.  Monitor for any signs/symptoms of infection. Call the office immediately if any occur or go directly to the emergency room. Call with any questions/concerns.  -- Terbinafine Tablets What is this medication? TERBINAFINE (TER bin a feen) treats fungal infections of the nails. It belongs to a group of medications called antifungals. It will not treat infections caused by bacteria or viruses. This medicine may be used for other purposes; ask your health care provider or pharmacist if you have questions. COMMON BRAND NAME(S): Lamisil, Terbinex What should I tell my care team before I take this medication? They need to know if you have any of these conditions: Liver disease An unusual or allergic reaction to terbinafine, other medications, foods, dyes, or preservatives Pregnant or trying to get pregnant Breast-feeding How should I use this medication? Take this medication by mouth with water. Take it as directed on the prescription label at the same time every day. You can take it with or without food. If it upsets your stomach, take it with food. Keep taking it unless your care team tells you to stop. A special MedGuide will be given to you by the pharmacist with each prescription and refill. Be sure to read this information carefully each time. Talk to your care team regarding the use of this medication in children. Special care may be needed. Overdosage: If you think you have taken too much of this medicine contact a poison control center or emergency room at once. NOTE: This medicine is only for you. Do not share this medicine with others. What if I miss a dose? If you miss a dose, take it as soon as you can unless it is more than 4 hours late. If it is more than 4 hours late, skip the missed dose. Take the next dose at the normal time. What may interact with this medication? Do not  take this medication with any of the following: Pimozide Thioridazine This medication may also interact with the following: Beta blockers Caffeine Certain medications for mental health conditions Cimetidine Cyclosporine Medications for fungal infections like fluconazole and ketoconazole Medications for irregular heartbeat like amiodarone, flecainide and propafenone Rifampin Warfarin This list may not describe all possible interactions. Give your health care provider a list of all the medicines, herbs, non-prescription drugs, or dietary supplements you use. Also tell them if you smoke, drink alcohol, or use illegal drugs. Some items may interact with your medicine. What should I watch for while using this medication? Visit your care team for regular checks on your progress. You may need blood work while you are taking this medication. It may be some time before you see the benefit from this medication. This medication may cause serious skin reactions. They can happen weeks to months after starting the medication. Contact your care team right away if you notice fevers or flu-like symptoms with a rash. The rash may be red or purple and then turn into blisters or peeling of the skin. Or, you might notice a red rash with swelling of the face, lips or lymph nodes in your neck or under your arms. This medication can make you more sensitive to the sun. Keep out of the sun, If you cannot avoid being in the sun, wear protective clothing and sunscreen. Do not use sun lamps or tanning beds/booths. What side effects may I notice from receiving this medication? Side effects  that you should report to your care team as soon as possible: Allergic reactions--skin rash, itching, hives, swelling of the face, lips, tongue, or throat Change in sense of smell Change in taste Infection--fever, chills, cough, or sore throat Liver injury--right upper belly pain, loss of appetite, nausea, light-colored stool, dark  yellow or brown urine, yellowing skin or eyes, unusual weakness or fatigue Low red blood cell level--unusual weakness or fatigue, dizziness, headache, trouble breathing Lupus-like syndrome--joint pain, swelling, or stiffness, butterfly-shaped rash on the face, rashes that get worse in the sun, fever, unusual weakness or fatigue Rash, fever, and swollen lymph nodes Redness, blistering, peeling, or loosening of the skin, including inside the mouth Unusual bruising or bleeding Worsening mood, feelings of depression Side effects that usually do not require medical attention (report to your care team if they continue or are bothersome): Diarrhea Gas Headache Nausea Stomach pain Upset stomach This list may not describe all possible side effects. Call your doctor for medical advice about side effects. You may report side effects to FDA at 1-800-FDA-1088. Where should I keep my medication? Keep out of the reach of children and pets. Store between 20 and 25 degrees C (68 and 77 degrees F). Protect from light. Get rid of any unused medication after the expiration date. To get rid of medications that are no longer needed or have expired: Take the medication to a medication take-back program. Check with your pharmacy or law enforcement to find a location. If you cannot return the medication, check the label or package insert to see if the medication should be thrown out in the garbage or flushed down the toilet. If you are not sure, ask your care team. If it is safe to put it in the trash, take the medication out of the container. Mix the medication with cat litter, dirt, coffee grounds, or other unwanted substance. Seal the mixture in a bag or container. Put it in the trash. NOTE: This sheet is a summary. It may not cover all possible information. If you have questions about this medicine, talk to your doctor, pharmacist, or health care provider.  2024 Elsevier/Gold Standard (2020-12-19 00:00:00)

## 2023-04-14 NOTE — Progress Notes (Signed)
Subjective: Chief Complaint  Patient presents with   Foot Pain    RM#12 removal of soft tissue mass right foot   Procedure male presents the office today with his wife for surgical excision of painful skin lesion on his right foot which been ongoing for a year or more.  He is attempted conservative treatment recently improvement was proceed with surgical removal at this time, biopsy.    This is concerned by his toenail discoloration is noted in his big toe.  Nails thickened has discoloration. Chronic ongoing issue as well.  No recent treatment.   Objective: AAO x3, NAD DP/PT pulses palpable bilaterally, CRT less than 3 seconds Toenails have yellow, brown discoloration are hypertrophic and dystrophic conservative hallux toenail.  There is no pain.  No edema, erythema or signs of infection. Overall symptoms are unchanged.  On the lateral aspect the right foot just proximal to fifth metatarsal base of annular hyperkeratotic lesion causing quite a bit of discomfort.  There is no ulceration identified is no drainage or pus or any signs of infection. No pain with calf compression, swelling, warmth, erythema  Assessment: Skin lesion right foot  Plan: -All treatment options discussed with the patient including all alternatives, risks, complications.  -I discussed the conservative as well as surgical options.  He wants to proceed with surgical excision of this area, biopsy.  We discussed alternatives, risks, complications.  Consent was signed.  I cleaned the skin with alcohol and regionally 3 mL of lidocaine, Marcaine plain was infiltrated in a regional block fashion.  I then injected 1.5 mL lidocaine with epinephrine.  I prepped the area in a sterile fashion.  Utilizing 8 mm punch biopsy in order to excise the lesion.  An additional 1.5 mL of lidocaine plain was infiltrated to ensure anesthesia.  I then performed a punch biopsy and remove the wedge of tissue to send to pathology.  This was sent to  [facility] for evaluation.  I irrigated with saline.  Hemostasis achieved.  Then utilized 2-0 nylon suture.  Xeroform was applied followed by dressing.  Tolerated procedure any complications. -Discussed he continues to be used in 3 days and apply some amount of antibiotic ointment followed by dressing. -Surgical shoe dispensed for offloading of facilitate healing -Prescribed Keflex, Vicodin  -In regards to the nail fungus with the oral, topical as well as alternative treatments.  Wants proceed oral Lamisil.  Will check CBC and LFT prior to starting the medication.   Vivi Barrack DPM

## 2023-04-22 ENCOUNTER — Other Ambulatory Visit: Payer: Self-pay | Admitting: Podiatry

## 2023-04-28 ENCOUNTER — Ambulatory Visit (INDEPENDENT_AMBULATORY_CARE_PROVIDER_SITE_OTHER): Payer: 59 | Admitting: Podiatry

## 2023-04-28 DIAGNOSIS — L989 Disorder of the skin and subcutaneous tissue, unspecified: Secondary | ICD-10-CM

## 2023-04-28 NOTE — Patient Instructions (Signed)

## 2023-05-03 NOTE — Progress Notes (Signed)
Subjective: Chief Complaint  Patient presents with   Foot Pain    RM#11 Right foot follow up suture removal after procedure.Patient states area is tender but doing well.    47 year old male presents the office today for suture removal status post soft tissue mass excision right foot.  He has been wearing regular shoes.  Some occasional discomfort.  He is using a bandage daily.  Is not reporting drainage or pus.  No fevers or chills.  No other concerns.  Objective: AAO x3, NAD DP/PT pulses palpable bilaterally, CRT less than 3 seconds Incision with sutures intact.  Small granulation tissue on the superficial is present and appears to be granulating in nicely.  There is no surrounding erythema, drainage or pus or ascending cellulitis.  No fluctuation or crepitation.  There is no malodor. No pain with calf compression, swelling, warmth, erythema  Assessment: Skin lesion right foot  Plan: -All treatment options discussed with the patient including all alternatives, risks, complications.  -Reviewed pathology. -Sutures removed without complications.  Recommended Epsom salt soaks daily.  Antibiotic ointment dressing changes daily.  Offloading.  Monitor for any signs or symptoms of infection or any reoccurrence.  Return in about 2 weeks (around 05/12/2023), or if symptoms worsen or fail to improve.  Vivi Barrack DPM

## 2023-05-28 ENCOUNTER — Ambulatory Visit: Payer: 59 | Admitting: Podiatry
